# Patient Record
Sex: Female | Born: 1975
Health system: Southern US, Community
[De-identification: ages and names within clinical notes are randomized; demographics above are authoritative.]

## PROBLEM LIST (undated history)

## (undated) DIAGNOSIS — R102 Pelvic and perineal pain unspecified side: Secondary | ICD-10-CM

## (undated) DIAGNOSIS — M8580 Other specified disorders of bone density and structure, unspecified site: Secondary | ICD-10-CM

## (undated) DIAGNOSIS — G4730 Sleep apnea, unspecified: Secondary | ICD-10-CM

## (undated) DIAGNOSIS — F411 Generalized anxiety disorder: Secondary | ICD-10-CM

## (undated) DIAGNOSIS — F329 Major depressive disorder, single episode, unspecified: Secondary | ICD-10-CM

## (undated) DIAGNOSIS — G43909 Migraine, unspecified, not intractable, without status migrainosus: Secondary | ICD-10-CM

## (undated) DIAGNOSIS — M1990 Unspecified osteoarthritis, unspecified site: Secondary | ICD-10-CM

## (undated) DIAGNOSIS — R0600 Dyspnea, unspecified: Secondary | ICD-10-CM

## (undated) DIAGNOSIS — Z8742 Personal history of other diseases of the female genital tract: Secondary | ICD-10-CM

## (undated) DIAGNOSIS — Z8659 Personal history of other mental and behavioral disorders: Secondary | ICD-10-CM

## (undated) DIAGNOSIS — Z923 Personal history of irradiation: Secondary | ICD-10-CM

## (undated) DIAGNOSIS — Z8719 Personal history of other diseases of the digestive system: Secondary | ICD-10-CM

## (undated) DIAGNOSIS — C50412 Malignant neoplasm of upper-outer quadrant of left female breast: Secondary | ICD-10-CM

## (undated) DIAGNOSIS — C50919 Malignant neoplasm of unspecified site of unspecified female breast: Secondary | ICD-10-CM

## (undated) DIAGNOSIS — Z9221 Personal history of antineoplastic chemotherapy: Secondary | ICD-10-CM

## (undated) DIAGNOSIS — Z973 Presence of spectacles and contact lenses: Secondary | ICD-10-CM

## (undated) DIAGNOSIS — R600 Localized edema: Secondary | ICD-10-CM

## (undated) DIAGNOSIS — E079 Disorder of thyroid, unspecified: Secondary | ICD-10-CM

## (undated) DIAGNOSIS — K649 Unspecified hemorrhoids: Secondary | ICD-10-CM

## (undated) DIAGNOSIS — M5020 Other cervical disc displacement, unspecified cervical region: Secondary | ICD-10-CM

## (undated) DIAGNOSIS — M810 Age-related osteoporosis without current pathological fracture: Secondary | ICD-10-CM

## (undated) DIAGNOSIS — G4733 Obstructive sleep apnea (adult) (pediatric): Secondary | ICD-10-CM

## (undated) DIAGNOSIS — K219 Gastro-esophageal reflux disease without esophagitis: Secondary | ICD-10-CM

## (undated) DIAGNOSIS — Z9889 Other specified postprocedural states: Secondary | ICD-10-CM

## (undated) DIAGNOSIS — T7840XA Allergy, unspecified, initial encounter: Secondary | ICD-10-CM

## (undated) DIAGNOSIS — R112 Nausea with vomiting, unspecified: Secondary | ICD-10-CM

## (undated) DIAGNOSIS — M797 Fibromyalgia: Secondary | ICD-10-CM

## (undated) DIAGNOSIS — M1380 Other specified arthritis, unspecified site: Secondary | ICD-10-CM

## (undated) DIAGNOSIS — M069 Rheumatoid arthritis, unspecified: Secondary | ICD-10-CM

## (undated) DIAGNOSIS — J302 Other seasonal allergic rhinitis: Secondary | ICD-10-CM

## (undated) HISTORY — DX: Other specified disorders of bone density and structure, unspecified site: M85.80

## (undated) HISTORY — DX: Fibromyalgia: M79.7

## (undated) HISTORY — DX: Age-related osteoporosis without current pathological fracture: M81.0

## (undated) HISTORY — PX: ABDOMINAL HYSTERECTOMY: SHX81

## (undated) HISTORY — DX: Major depressive disorder, single episode, unspecified: F32.9

## (undated) HISTORY — DX: Other cervical disc displacement, unspecified cervical region: M50.20

## (undated) HISTORY — PX: COLONOSCOPY: SHX174

## (undated) HISTORY — DX: Rheumatoid arthritis, unspecified: M06.9

## (undated) HISTORY — DX: Obstructive sleep apnea (adult) (pediatric): G47.33

## (undated) HISTORY — DX: Generalized anxiety disorder: F41.1

## (undated) HISTORY — PX: COLONOSCOPY WITH ESOPHAGOGASTRODUODENOSCOPY (EGD): SHX5779

## (undated) HISTORY — DX: Sleep apnea, unspecified: G47.30

## (undated) HISTORY — PX: LAPAROSCOPIC ASSISTED VAGINAL HYSTERECTOMY: SHX5398

## (undated) HISTORY — DX: Other seasonal allergic rhinitis: J30.2

## (undated) HISTORY — DX: Migraine, unspecified, not intractable, without status migrainosus: G43.909

## (undated) HISTORY — DX: Disorder of thyroid, unspecified: E07.9

## (undated) HISTORY — PX: UPPER GASTROINTESTINAL ENDOSCOPY: SHX188

---

## 1997-10-27 ENCOUNTER — Ambulatory Visit (HOSPITAL_COMMUNITY): Admission: RE | Admit: 1997-10-27 | Discharge: 1997-10-27 | Payer: Self-pay | Admitting: Obstetrics & Gynecology

## 2000-02-05 ENCOUNTER — Emergency Department (HOSPITAL_COMMUNITY): Admission: EM | Admit: 2000-02-05 | Discharge: 2000-02-06 | Payer: Self-pay | Admitting: Emergency Medicine

## 2000-06-02 ENCOUNTER — Other Ambulatory Visit: Admission: RE | Admit: 2000-06-02 | Discharge: 2000-06-02 | Payer: Self-pay | Admitting: Obstetrics and Gynecology

## 2000-07-07 HISTORY — PX: LAPAROSCOPY: SHX197

## 2001-08-17 ENCOUNTER — Other Ambulatory Visit: Admission: RE | Admit: 2001-08-17 | Discharge: 2001-08-17 | Payer: Self-pay | Admitting: Obstetrics and Gynecology

## 2002-05-27 ENCOUNTER — Encounter: Payer: Self-pay | Admitting: Emergency Medicine

## 2002-05-27 ENCOUNTER — Emergency Department (HOSPITAL_COMMUNITY): Admission: EM | Admit: 2002-05-27 | Discharge: 2002-05-27 | Payer: Self-pay | Admitting: Emergency Medicine

## 2002-07-03 ENCOUNTER — Inpatient Hospital Stay (HOSPITAL_COMMUNITY): Admission: AD | Admit: 2002-07-03 | Discharge: 2002-07-03 | Payer: Self-pay | Admitting: *Deleted

## 2002-07-23 ENCOUNTER — Emergency Department (HOSPITAL_COMMUNITY): Admission: EM | Admit: 2002-07-23 | Discharge: 2002-07-23 | Payer: Self-pay | Admitting: Emergency Medicine

## 2002-12-06 ENCOUNTER — Inpatient Hospital Stay (HOSPITAL_COMMUNITY): Admission: EM | Admit: 2002-12-06 | Discharge: 2002-12-10 | Payer: Self-pay | Admitting: Psychiatry

## 2003-02-14 ENCOUNTER — Inpatient Hospital Stay (HOSPITAL_COMMUNITY): Admission: AD | Admit: 2003-02-14 | Discharge: 2003-02-14 | Payer: Self-pay | Admitting: Obstetrics and Gynecology

## 2003-09-06 ENCOUNTER — Emergency Department (HOSPITAL_COMMUNITY): Admission: EM | Admit: 2003-09-06 | Discharge: 2003-09-06 | Payer: Self-pay | Admitting: Emergency Medicine

## 2004-06-29 ENCOUNTER — Inpatient Hospital Stay (HOSPITAL_COMMUNITY): Admission: AD | Admit: 2004-06-29 | Discharge: 2004-06-29 | Payer: Self-pay | Admitting: Obstetrics and Gynecology

## 2004-10-02 ENCOUNTER — Inpatient Hospital Stay (HOSPITAL_COMMUNITY): Admission: AD | Admit: 2004-10-02 | Discharge: 2004-10-02 | Payer: Self-pay | Admitting: Obstetrics and Gynecology

## 2005-03-15 ENCOUNTER — Emergency Department (HOSPITAL_COMMUNITY): Admission: EM | Admit: 2005-03-15 | Discharge: 2005-03-15 | Payer: Self-pay | Admitting: Family Medicine

## 2005-04-01 ENCOUNTER — Inpatient Hospital Stay (HOSPITAL_COMMUNITY): Admission: AD | Admit: 2005-04-01 | Discharge: 2005-04-01 | Payer: Self-pay | Admitting: *Deleted

## 2005-04-15 ENCOUNTER — Ambulatory Visit: Payer: Self-pay | Admitting: Obstetrics and Gynecology

## 2005-05-09 ENCOUNTER — Inpatient Hospital Stay (HOSPITAL_COMMUNITY): Admission: AD | Admit: 2005-05-09 | Discharge: 2005-05-09 | Payer: Self-pay | Admitting: *Deleted

## 2005-05-14 ENCOUNTER — Inpatient Hospital Stay (HOSPITAL_COMMUNITY): Admission: AD | Admit: 2005-05-14 | Discharge: 2005-05-14 | Payer: Self-pay | Admitting: Obstetrics and Gynecology

## 2005-05-20 ENCOUNTER — Ambulatory Visit: Payer: Self-pay | Admitting: Obstetrics and Gynecology

## 2005-07-09 ENCOUNTER — Inpatient Hospital Stay (HOSPITAL_COMMUNITY): Admission: AD | Admit: 2005-07-09 | Discharge: 2005-07-09 | Payer: Self-pay | Admitting: Obstetrics and Gynecology

## 2005-07-29 ENCOUNTER — Ambulatory Visit: Payer: Self-pay | Admitting: Obstetrics & Gynecology

## 2005-08-06 ENCOUNTER — Ambulatory Visit: Payer: Self-pay | Admitting: Obstetrics and Gynecology

## 2005-09-25 IMAGING — CT CT ABDOMEN W/ CM
1 of 3 series · 13 of 32 positions shown, 19 images · IV contrast ([ID] READICAT & [ID] OMNI/300)
Comparison: none

CLINICAL DATA: Severe right abdominal pain.  
CT ABDOMEN AND PELVIS WITH ORAL AND IV CONTRAST ? 06/29/04:
TECHNIQUE: 100 cc of Omnipaque 300 contrast intravenous with 7 mm helical imaging performed through the abdomen and pelvis.
No comparisons.   
ABDOMEN CT WITH CONTRAST:

[Series 2: — · axial · 0.57mm/px · z∈[-405,-45]mm · 13 of 70 slices shown, 19 images]
[im 5/70  soft-tissue]
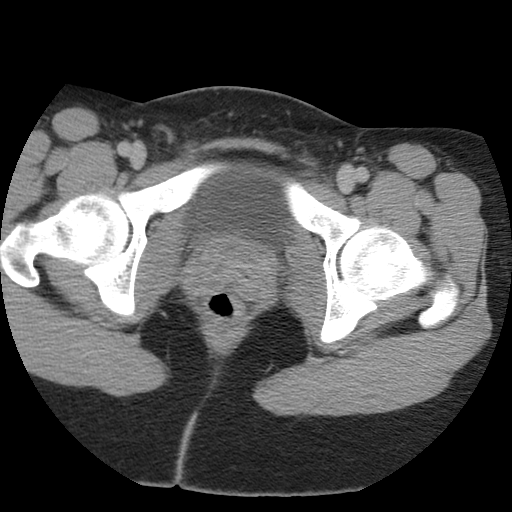
[im 5/70  bone]
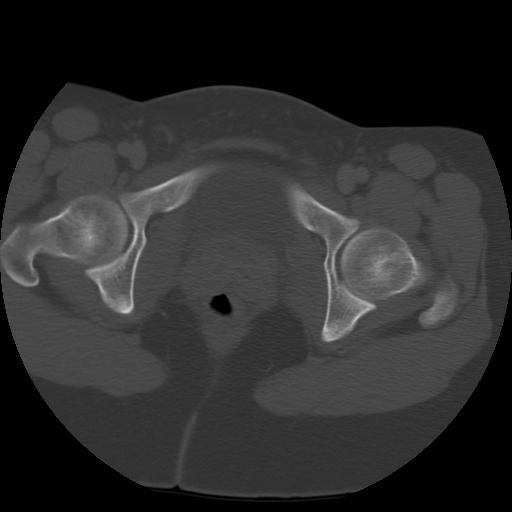
[im 10/70  soft-tissue]
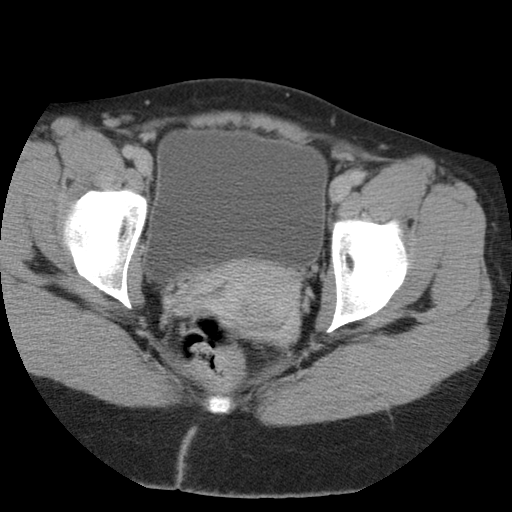
[im 14/70  soft-tissue]
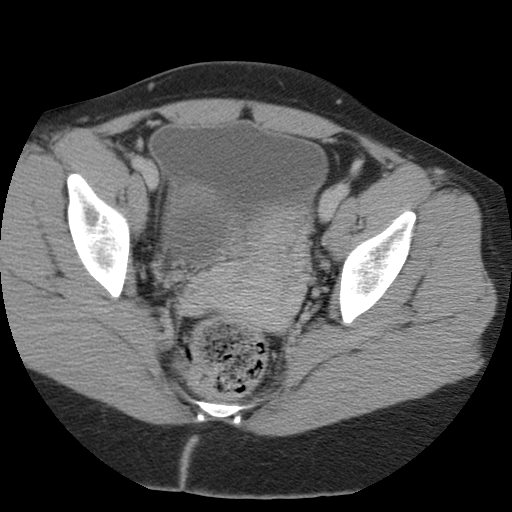
[im 19/70  soft-tissue]
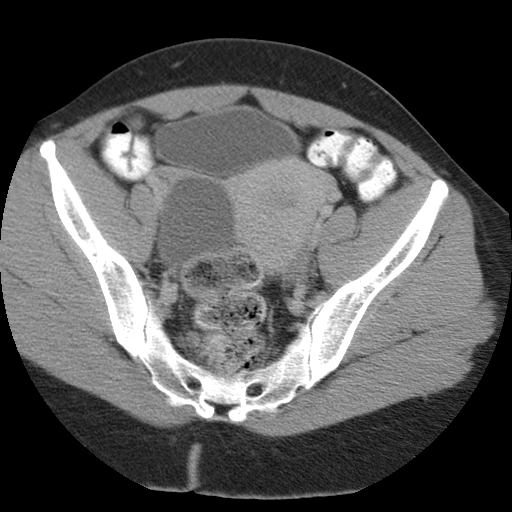
[im 24/70  soft-tissue]
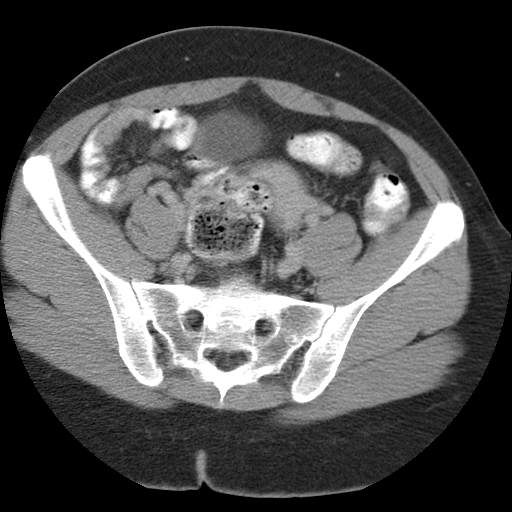
[im 28/70  soft-tissue]
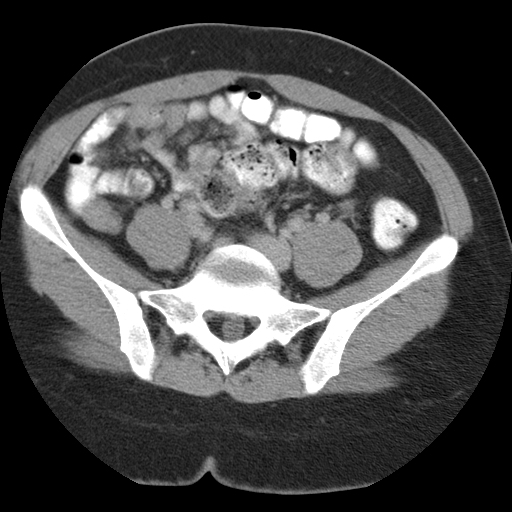
[im 37/70  soft-tissue]
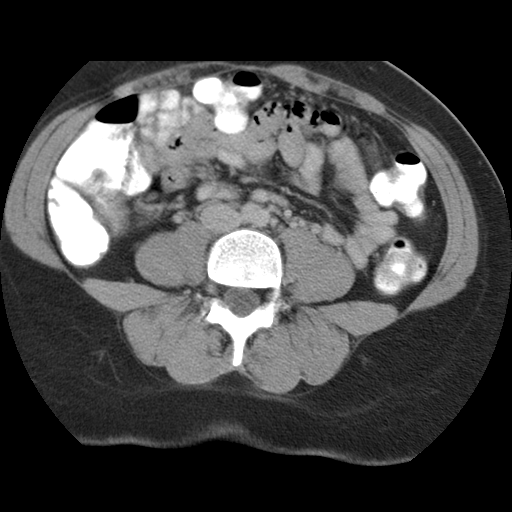
[im 42/70  soft-tissue]
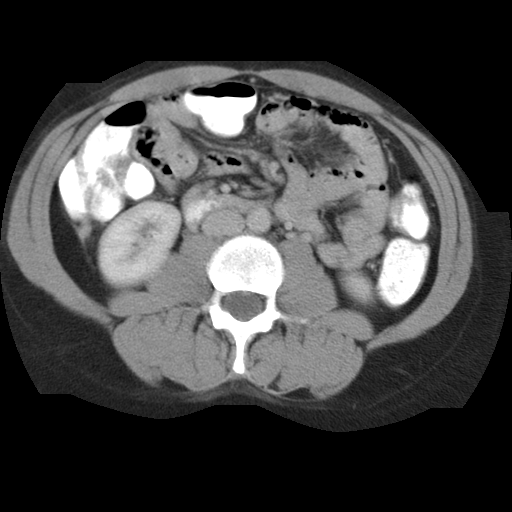
[im 47/70  soft-tissue]
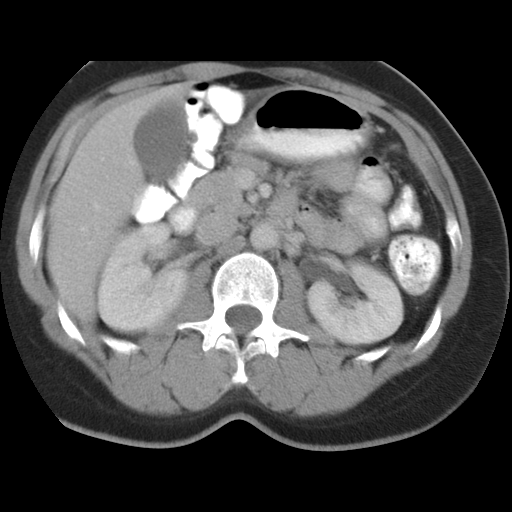
[im 47/70  bone]
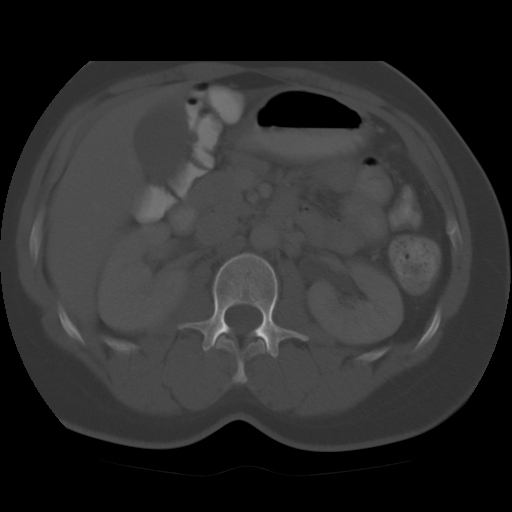
[im 51/70  soft-tissue]
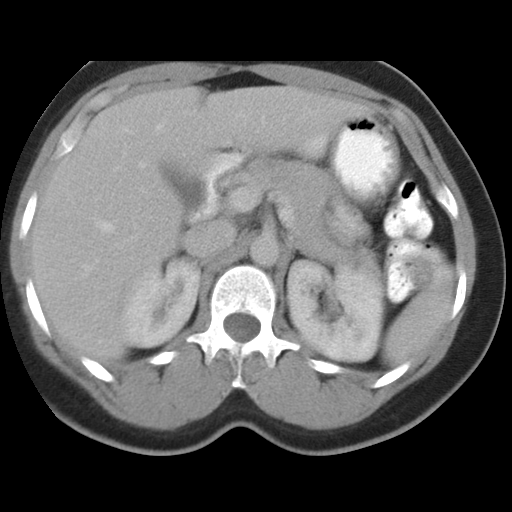
[im 51/70  lung]
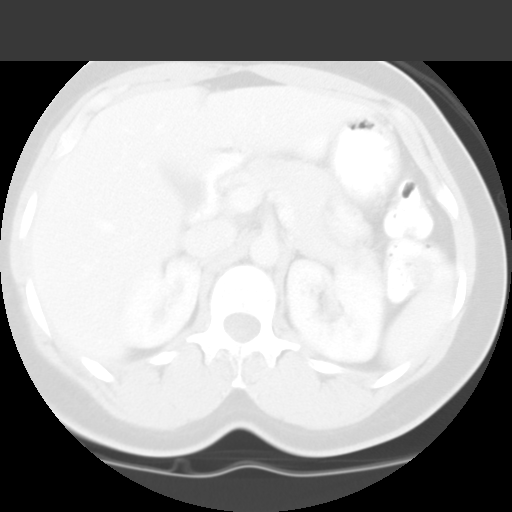
[im 56/70  soft-tissue]
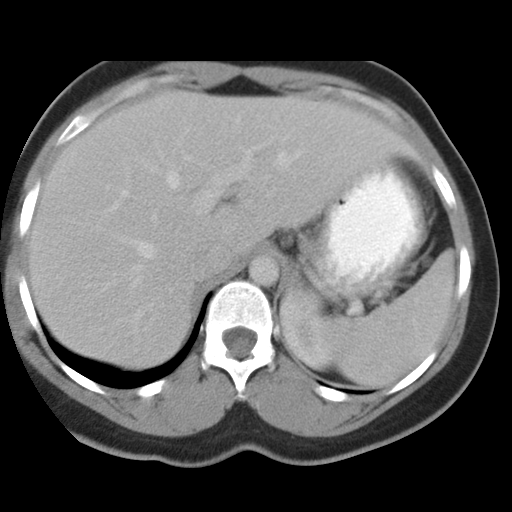
[im 56/70  lung]
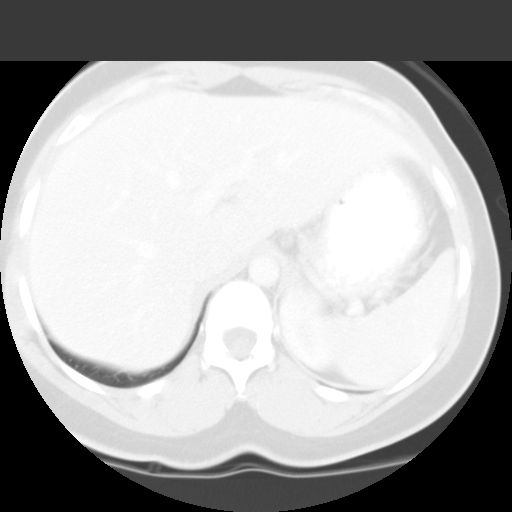
[im 60/70  soft-tissue]
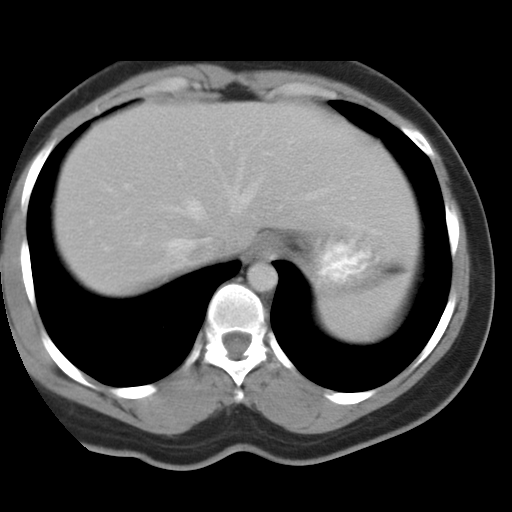
[im 60/70  lung]
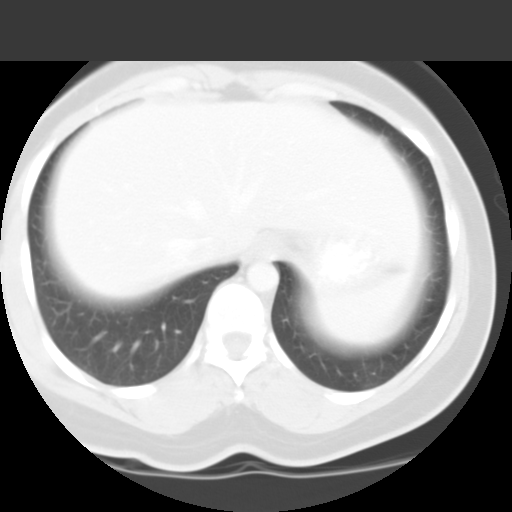
[im 65/70  soft-tissue]
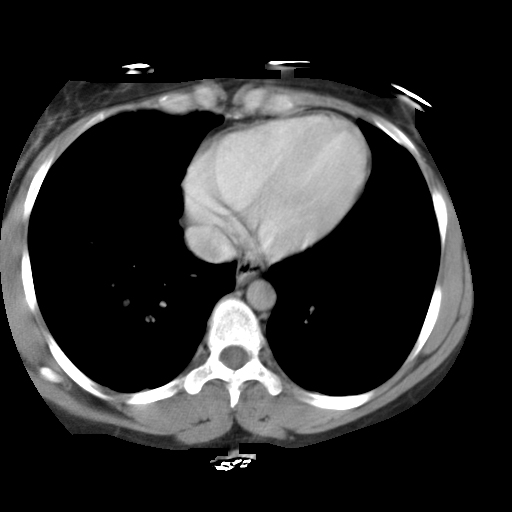
[im 65/70  lung]
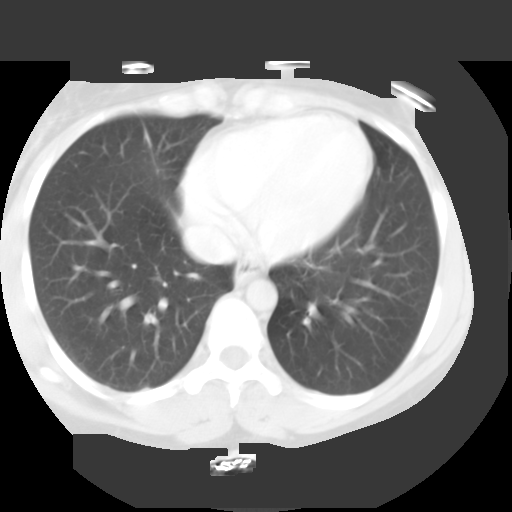

[13 of 32 positions shown; findings below may reference images not displayed]

FINDINGS: The lung bases are clear.  Minor inferior right middle lobe scarring or atelectasis.  No pericardial or pleural fluid.  The liver, gallbladder, biliary system, pancreas, spleen, adrenal glands, and kidneys are normal.  No bowel obstruction, ascites, lymphadenopathy, or free air.  In the right abdomen, the appendix appears to be retrocecal extending inferior to the liver surface.  There is contrast in the proximal aspect of the appendix without evidence of definite wall thickening.  The terminal ileum is normal as well.
IMPRESSION: No acute finding in the abdomen.
CT PELVIS WITH CONTRAST:
FINDINGS: In the right adnexa, there is an adnexal cystic lesion measuring 5.5 x 4.7 cm consistent with a right ovarian cyst.  No significant free fluid.  The cyst does appear to displace the uterus to the left.  The uterus and bladder otherwise are normal.
IMPRESSION: 5.5 cm right adnexal likely ovarian cyst.

## 2005-10-06 ENCOUNTER — Ambulatory Visit: Payer: Self-pay | Admitting: Obstetrics & Gynecology

## 2005-10-08 ENCOUNTER — Ambulatory Visit: Payer: Self-pay | Admitting: Obstetrics & Gynecology

## 2005-10-08 ENCOUNTER — Encounter: Payer: Self-pay | Admitting: Obstetrics & Gynecology

## 2005-12-03 ENCOUNTER — Ambulatory Visit: Payer: Self-pay | Admitting: Obstetrics & Gynecology

## 2006-01-14 ENCOUNTER — Inpatient Hospital Stay (HOSPITAL_COMMUNITY): Admission: RE | Admit: 2006-01-14 | Discharge: 2006-01-16 | Payer: Self-pay | Admitting: Obstetrics & Gynecology

## 2006-01-14 ENCOUNTER — Ambulatory Visit: Payer: Self-pay | Admitting: Obstetrics & Gynecology

## 2006-01-14 ENCOUNTER — Encounter (INDEPENDENT_AMBULATORY_CARE_PROVIDER_SITE_OTHER): Payer: Self-pay | Admitting: Specialist

## 2006-02-05 ENCOUNTER — Ambulatory Visit: Payer: Self-pay | Admitting: Obstetrics and Gynecology

## 2006-02-25 ENCOUNTER — Ambulatory Visit: Payer: Self-pay | Admitting: Obstetrics & Gynecology

## 2006-08-07 ENCOUNTER — Inpatient Hospital Stay (HOSPITAL_COMMUNITY): Admission: AD | Admit: 2006-08-07 | Discharge: 2006-08-08 | Payer: Self-pay | Admitting: Obstetrics & Gynecology

## 2006-11-10 ENCOUNTER — Emergency Department (HOSPITAL_COMMUNITY): Admission: EM | Admit: 2006-11-10 | Discharge: 2006-11-10 | Payer: Self-pay | Admitting: Family Medicine

## 2006-11-27 ENCOUNTER — Emergency Department (HOSPITAL_COMMUNITY): Admission: EM | Admit: 2006-11-27 | Discharge: 2006-11-27 | Payer: Self-pay | Admitting: Emergency Medicine

## 2007-11-26 ENCOUNTER — Emergency Department (HOSPITAL_COMMUNITY): Admission: EM | Admit: 2007-11-26 | Discharge: 2007-11-26 | Payer: Self-pay | Admitting: Emergency Medicine

## 2007-12-20 ENCOUNTER — Emergency Department (HOSPITAL_COMMUNITY): Admission: EM | Admit: 2007-12-20 | Discharge: 2007-12-20 | Payer: Self-pay | Admitting: Emergency Medicine

## 2008-01-26 ENCOUNTER — Inpatient Hospital Stay (HOSPITAL_COMMUNITY): Admission: AD | Admit: 2008-01-26 | Discharge: 2008-01-26 | Payer: Self-pay | Admitting: Obstetrics & Gynecology

## 2008-02-09 ENCOUNTER — Ambulatory Visit: Payer: Self-pay | Admitting: Gynecology

## 2008-12-11 ENCOUNTER — Inpatient Hospital Stay (HOSPITAL_COMMUNITY): Admission: AD | Admit: 2008-12-11 | Discharge: 2008-12-11 | Payer: Self-pay | Admitting: Obstetrics & Gynecology
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
## 2009-04-16 ENCOUNTER — Emergency Department (HOSPITAL_COMMUNITY): Admission: EM | Admit: 2009-04-16 | Discharge: 2009-04-16 | Payer: Self-pay | Admitting: Emergency Medicine

## 2010-06-03 ENCOUNTER — Encounter: Admission: RE | Admit: 2010-06-03 | Discharge: 2010-06-03 | Payer: Self-pay | Admitting: Obstetrics & Gynecology

## 2010-10-14 LAB — URINALYSIS, ROUTINE W REFLEX MICROSCOPIC
Bilirubin Urine: NEGATIVE
Glucose, UA: NEGATIVE mg/dL
Hgb urine dipstick: NEGATIVE
Ketones, ur: NEGATIVE mg/dL
Nitrite: NEGATIVE
Protein, ur: NEGATIVE mg/dL
Specific Gravity, Urine: 1.02 (ref 1.005–1.030)
Urobilinogen, UA: 0.2 mg/dL (ref 0.0–1.0)
pH: 6 (ref 5.0–8.0)

## 2010-11-19 NOTE — Group Therapy Note (Signed)
[name], [name]             ACCOUNT NO.:  [id number]   MEDICAL RECORD NO.:  [id number]          PATIENT TYPE:  WOC   LOCATION:  [facility]                   FACILITY:  [facility]   PHYSICIAN:  Caren Griffins, CNM       DATE OF BIRTH:  01/07/1976   DATE OF SERVICE:                                  CLINIC NOTE   REASON FOR VISIT:  Lower abdominal pain.   HISTORY:  [name] is a 35 year old G1, P1 who underwent a  transvaginal hysterectomy in 2007 after having dysmenorrhea that was  refractory to medical treatment.  Findings were a small posterior  fibroid  and no clear evidence of endometriosis, which had been the  working diagnosis, however, the patient states that after her  hysterectomy her pain was 95% gone.  She is here in followup from an MAU  visit where she was evaluated on January 26, 2008 for lower abdominal pain.  She is describing mid lower abdominal pain that is often worse with  urination and has been present on and off for several weeks.  It is also  related to dyspareunia.  She states that when she has too much wine or  when her bladder is over full, she has a flare.  She describes it as  sharp mid suprapubic area pain that lasts about 20 minutes at a time.  She also has some urgency and frequency, voiding small amounts, gets up  2 times a night to void.  She occasionally has mittelschmerz.  She has  no stress or urgency urinary incontinence.  She did not get filled  prescriptions for Bactrim, Pyridium or Flagyl which were given to her  after her January 26, 2008 visit.  Of note, she had bladder tenderness on  exam which was thought to be spasm of her bladder but she did have a  completely negative urinalysis and no urine culture.  She now states she  does have some finances to fill prescriptions.   OBJECTIVE:  VITAL SIGNS:  98 temperature, pulse 72, BP 117/81, weight  174, height 5 feet 4 inches.  GENERAL:  She is in no distress.  Well nourished, well developed.  Physical exam is deferred as she just had exam with pelvic on January 26, 2008.   ASSESSMENT:  Possible early interstitial cystitis.   PLAN:  The patient is given a [facility] handout and website information  explaining risk factors, causes, symptoms and management for  interstitial cystitis and painful bladder syndrome.  She is advised to  avoid any trigger symptoms including too much caffeinated drinks,  alcohol, coffee and tea, or spicy foods.  In consultation with Dr. Okey Dupre,  we are advising her to go ahead and take her Flagyl for the BV as well  as take the Pyridium and not to take the Bactrim.  If she still has  problems after a week or so, she is also advised to do a voiding diary  prior to a return visit.  We will consider referral to a urologist if  her symptoms are still present.           ______________________________  Caren Griffins, CNM     DP/MEDQ  D:  02/09/2008  T:  02/09/2008  Job:  [id number]

## 2010-11-22 NOTE — Group Therapy Note (Signed)
[name], [name]             ACCOUNT NO.:  [id number]   MEDICAL RECORD NO.:  [id number]          PATIENT TYPE:  WOC   LOCATION:  [facility]                   FACILITY:  [facility]   PHYSICIAN:  Argentina Donovan, MD        DATE OF BIRTH:  February 02, 1976   DATE OF SERVICE:  04/15/2005                                    CLINIC NOTE   The patient is a 35 year old black female, gravida 1, para 1-0-0-1, with a  child age 1, had a laparoscopy back in '96 for severe dysmenorrhea and then  was treated after that for endometriosis.  Has been on continual oral  contraceptives 3 months at a time, just beginning up until this last 3 month  cycle, she was taking cyclically with a period every month and not  tolerating periods very well.  She has __________ Lupron, and we told her  that it was something she was going to use temporarily, and we mentioned  Depo-Provera to her.  It sounded like a good idea since she has had  continual spotting on the Ortho-Novum, probably 30 mg.  We are going to  start her on Depo-Provera.  She is to continue for 1 more week on the oral  contraceptives and see how she does on that.  Told that she must take  calcium and vitamin D when she is on Depo-Provera, and she needs an  injection every 3 months.   IMPRESSION:  Severe dysmenorrhea, probable endometriosis.  The other thing  is that the patient was seen in the MAU because of spotting, was diagnosed  with severe cervicitis was treated with __________ and Zithromax and has not  had any spotting since.           ______________________________  Argentina Donovan, MD     PR/MEDQ  D:  04/15/2005  T:  04/15/2005  Job:  [id number]

## 2010-11-22 NOTE — Discharge Summary (Signed)
[name], [name]             ACCOUNT NO.:  [id number]   MEDICAL RECORD NO.:  [id number]          PATIENT TYPE:  INP   LOCATION:  9316                          [facility]   PHYSICIAN:  Lesly Dukes, M.D. DATE OF BIRTH:  07-13-1975   DATE OF ADMISSION:  01/14/2006  DATE OF DISCHARGE:  12/03/2005                                 DISCHARGE SUMMARY   HOSPITAL COURSE:  The patient is a 35 year old female who underwent  laparoscopically assisted vaginal hysterectomy on January 14, 2006.  The  procedure was uncomplicated and the details of this procedure are in a  dictated operative note.  Postoperatively, the patient had increased pain on  postoperative day 1; however, throughout the day she began to ambulate more,  pass flatus and was able to have her pain controlled with oral pain  medication.  She never had any fevers and her vital signs remained stable at  all times.  The patient was discharged on January 16, 2006 in good condition.   DISCHARGE INSTRUCTIONS:  1.  No vaginal intercourse for six weeks.  2.  No heavy lifting for six weeks.  3.  No driving for one week or while taking pain medication.  4.  Take temperature and if greater than or equal to 100.4, come to      emergency room.  5.  Come to emergency room if severe abdominal pain, severe nausea or      vomiting.   DISCHARGE MEDICATIONS:  1.  Vicodin.  The patient was given prescription on July 10, the day before      coming to the hospital, for 40 pills.  The patient will use this for      postoperative pain.  2.  Motrin.   FOLLOWUP:  Follow-up appointment is in five weeks with Dr. Penne Lash in the  [facility].           ______________________________  Lesly Dukes, M.D.     KHL/MEDQ  D:  01/16/2006  T:  01/16/2006  Job:  [id number]

## 2010-11-22 NOTE — Op Note (Signed)
[name], [name]             ACCOUNT NO.:  [id number]   MEDICAL RECORD NO.:  [id number]          PATIENT TYPE:  INP   LOCATION:  9399                          [facility]   PHYSICIAN:  Lesly Dukes, M.D. DATE OF BIRTH:  [birth year]   DATE OF PROCEDURE:  01/14/2006  DATE OF DISCHARGE:                                 OPERATIVE REPORT   PREOPERATIVE DIAGNOSES:  The patient is a 35 year old female with  dysmenorrhea, refractory to medical therapy.   POSTOPERATIVE DIAGNOSES:  The patient is a 35 year old female with  dysmenorrhea, refractory to medical therapy.   PROCEDURE:  Transvaginal hysterectomy.   SURGEON:  Lesly Dukes, M.D.   ASSISTANT:  Ginger Carne, M.D.   ANESTHESIA:  General.   PATHOLOGY:  Uterus.   EBL:  600.   COMPLICATIONS:  None.   FINDINGS:  Globular uterus with a small posterior fibroid.  No clear  evidence of endometriosis.   DESCRIPTION OF PROCEDURE:  Informed consent was obtained.  The patient was  taken to the operating room, where general anesthesia was induced.  The  patient was placed in dorsal lithotomy position.  An exam under anesthesia  was performed.  Anteverted uterus, about 10-week size, was felt.  Uterine  manipulator was inserted after uterine sounding.  This was done after the  patient was prepared and draped in normal sterile fashion.  A Foley was  placed into the bladder, as well.  Gown and gloves were changed.  Using the  open method, the 12 mm umbilical incision was made.  The peritoneal cavity  was entered bluntly and a Hasson trocar was entered into the abdomen and a  pneumoperitoneum was achieved with CO2.  Two 5 mm ports were placed in the  lower quadrants bilaterally, under direct visualization.  Survey of  abdominal contents was performed with the above findings.  Using the Gyrus,  the round ligaments, utero-ovarians and a portion of the broad ligament were  transected and coagulated.  The bladder flap was  also created.   At this point, the vaginal portion of the procedure was performed.  Retractors were placed into the vagina and the cervix was grasped with a  Gerilyn Pilgrim tenaculum.  Pitressin was used to infiltrate the cervix.  The cervix  was circumferentially incised and the peritoneum was then identified and  entered sharply.  Retractors were placed intraperitoneally.  The same  procedure was done posteriorly and a weighted speculum was placed  posteriorly.  The cardinal ligaments were then clamped, transected and  suture ligated with 0 Vicryl.  The uterine arteries were then clamped,  transected and suture ligated with 0 Vicryl.  Good hemostasis was noted.  The broad ligaments were then clamped, transected and suture ligated with 0  Vicryl.  This released the specimen.  It was sent off to pathology.  The  pedicles were noted to be hemostatic.  The vaginal cuff and peritoneum were  closed posteriorly in a running baseball method and then the vaginal cuff  was closed anterior to posterior with 0 Vicryl in a running method.   Good hemostasis was  noted.  Clindamycin packing was placed into the vagina.  Gown and glove were changed again and a look laparoscopically noted a small  amount of oozing and appropriate coagulation was performed.  The abdomen was  copiously irrigated and hemostasis was assured one last time.  All  instruments were removed from the patient's abdomen and pneumoperitoneum was  released.  The 12 mm umbilical port was closed with 0 Vicryl and the  subcuticular ports were closed.   The patient tolerated the procedure well.  Sponge, instrument and needle  counts were correct times two.  The patient went to the recovery room in  stable condition.           ______________________________  Lesly Dukes, M.D.     KHL/MEDQ  D:  01/14/2006  T:  01/14/2006  Job:  [id number]

## 2010-11-22 NOTE — Discharge Summary (Signed)
NAMEKRITHI, [name]                         ACCOUNT NO.:  [id number]   MEDICAL RECORD NO.:  [id number]                   PATIENT TYPE:  IPS   LOCATION:  0501                                 [facility]ACILITY[facility]   PHYSICIAN:  Jeanice Lim, M.D.              DATE OF BIRTH:  01/02/1976   DATE OF ADMISSION:  12/06/2002  DATE OF DISCHARGE:  12/10/2002                                 DISCHARGE SUMMARY   IDENTIFYING DATA:  This is a 35 year old African-American female, divorced,  voluntarily admitted with a history of depression  and marital conflict.  Felt overwhelmed and wanted to crash car.  Suicidal for 2-3 days.   MEDICATIONS:  None.   ALLERGIES:  No known drug allergies.   PHYSICAL EXAMINATION:  Negative.  Neurologically nonfocal.   LABORATORY DATA:  Routine admission labs within normal limits.   MENTAL STATUS EXAM:  Fully alert, in no acute distress.  Cooperative.  Pleasant.  Speech within normal limits.  Mood depressed.  Thought processes  goal directed.  No acute suicidal or homicidal ideation.  Cognitively  intact.  Judgment and insight fair at this time.   ADMISSION DIAGNOSES:   AXIS I:  Major depressive disorder, recurrent, severe.   AXIS II:  Deferred.   AXIS III:  None.   AXIS IV:  Moderate (problems with support system and relationship).   AXIS V:  35/65-70.   HOSPITAL COURSE:  The patient was admitted and ordered routine p.r.n.  medications and underwent further monitoring.  Was encouraged to  participated in individual, group and milieu therapy.  Was placed on 15-  minute checks.  The patient reported wanting to drive car off road and kill  self.  Was very suicidal.  Wanted help.  Was going through a divorce and had  separated from ex-husband prior to admission.  The patient reported a  decrease in suicidal ideation, tolerated medication changes, reported a  positive response to clinical intervention.   CONDITION ON DISCHARGE:  Markedly improved.  Mood  more euthymic.  Affect  brighter.  Thought processes goal directed.  Thought content negative for  dangerous ideation or psychotic symptoms.  The patient reported motivation  to be compliant with the aftercare plan.   DISCHARGE MEDICATIONS:  1. Zoloft 50 mg q.a.m.  2. Ambien 10 mg, 1/2-1 q.h.s. p.r.n.   FOLLOW UP:  With Dr. ___________, primary care physician, on December 15, 2002  at 1 p.m. and for therapy with Dr. Theodoro Kos on December 30, 2002 at 1  p.m.   DISCHARGE DIAGNOSES:   AXIS I:  Major depressive disorder, recurrent, severe.   AXIS II:  Deferred.   AXIS III:  None.   AXIS IV:  Moderate (problems with support system and relationship).   AXIS V:  Global Assessment of Functioning on discharge 55.  Jeanice Lim, M.D.    JEM/MEDQ  D:  01/18/2003  T:  01/19/2003  Job:  [id number]
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
## 2010-11-22 NOTE — Group Therapy Note (Signed)
[name], [name]             ACCOUNT NO.:  [id number]   MEDICAL RECORD NO.:  [id number]          PATIENT TYPE:  WOC   LOCATION:  [facility]                   FACILITY:  [facility]   PHYSICIAN:  Elsie Lincoln, MD      DATE OF BIRTH:  22-Jul-1975   DATE OF SERVICE:                                    CLINIC NOTE   Patient is a 35 year old female who presents, deciding she wants a  hysterectomy.  Patient has __________ endometriosis.  She has had two  diagnostic laparoscopies by [facility], once in 1995 and once  in 1999.  There was no evidence of endometriosis but she has been treated  like she has had endometriosis with successful pain relief.  She has had  Lupron II shots and does get pain relief from this; however, the first month  after receiving shots, she has had bleeding for a month and cramping, and  she does not want to continue Lupron and having this one month of cramping  and bleeding, she just wants to go ahead and have definitive permanent  hysterectomy and BSO.  Patient understands this could put her into  menopause, and this puts her at increased risk for bone loss and heart  disease.  We will most likely put her on an estrogen patch.  I did offer her  a tertiary care center referral to [facility] with their chronic pain  specialist, and actually, I encouraged this strongly, but she does not want  this, she wants a hysterectomy.   So, she has been contemplating this for over six months, and I believe she  has put a lot of thought into this.  She previously thought she wanted no  more children, but this is now longer possible, as per her, she says the  pain is debilitating during that month in between shots, and she just wants  to go ahead and have everything done.  I will proceed with LAVH in case  there are adhesions.   PAST SURGICAL HISTORY:  Laparoscopy x2.   GYNECOLOGIC HISTORY:  NSVD x1.  No history of gonorrhea or Chlamydia.  No  abnormal Pap  smears.  No history of ovarian cyst or fibroid tumors.   FAMILY HISTORY:  No ovarian, uterine, breast, or colon cancer.  No history  of early heart attacks.   MEDICATIONS:  Lupron.   ALLERGIES:  Denies.   PHYSICAL EXAMINATION:  GENERAL:  Well-developed and well-nourished in no  apparent distress.  VITAL SIGNS:  Temperature 99.3, pulse 100, blood pressure 119/79.  Weight  178.1.  Height 5 foot 4 inches.  HEENT:  Normocephalic and atraumatic.  NECK:  Thyroid:  No masses.  LUNGS:  Clear to auscultation bilaterally.  HEART:  Regular rate and rhythm.  ABDOMEN:  Soft, nontender, nondistended.  Belly flat and well healed from  previous laparoscopy.  No hernia.  GENITALIA:  Tanner 5.  Uterus retroflexed.  Slightly tender.  Adnexa:  No  masses.  Nontender.   ASSESSMENT/PLAN:  A 35 year old female who has decided to have a  hysterectomy.  Again, as described above, I counseled her significantly  about her risk of going into menopause to her health.  She also understands  that a hysterectomy may not relieve all of her pain.  She also understands  that there is a risk of bleeding, infection, damage to intra-abdominal with  the laparoscopic assisted vaginal hysterectomy.  Even knowing all of this,  the patient elects for this procedure.  I believe that she is aware of all  of these things and has made a cautious decision.  We will schedule in the  next couple of weeks.           ______________________________  Elsie Lincoln, MD     KL/MEDQ  D:  12/03/2005  T:  12/03/2005  Job:  [id number]

## 2010-11-22 NOTE — Group Therapy Note (Signed)
NAMEVARIE, MACHAMER             ACCOUNT NO.:  [id number]   MEDICAL RECORD NO.:  [id number]          PATIENT TYPE:  WOC   LOCATION:  [facility]                   FACILITY:  [facility]   PHYSICIAN:  Elsie Lincoln, MD      DATE OF BIRTH:  12-03-1975   DATE OF SERVICE:                                    CLINIC NOTE   Patient is a 35 year old female, para 1, who has severe endometriosis and  pain related with this, who presented today to talk about hysterectomy.  She  had been on Depo-Provera in the past, but the spotting is getting on her  nerves and I believe she still has some pain.  She lost a job recently  because of the periods and she wants definitive treatment.  Today we  discussed Depo-Lupron and the possibility of keeping her on this long-term.  She does want another child and is slightly depressed that this not  possible, so I think that we should try Depo-Lupron today to see if putting  her in a medical menopause helps her periods.  If it does, then we could  consider long-term treatment with this with AVBAC therapy.  The patient  seems amenable to this.  I did warn her that she may have a withdrawal bleed  two weeks after the Depo-Lupron.  However, it will most likely be minimal to  none given that she is already under the influence of Depo-Provera.  Urine  pregnancy test not necessary secondary to Depo-Provera given on May 20, 2005.  The patient is to come back in 2-1/2 months to discuss with myself or  Dr. Okey Dupre how she did on Depo-Lupron and if she should have another shot, or  to do a hysterectomy or go visit Dr. __________  in [facility].           ______________________________  Elsie Lincoln, MD     KL/MEDQ  D:  07/29/2005  T:  07/29/2005  Job:  [id number]

## 2010-11-22 NOTE — Group Therapy Note (Signed)
Jordan, [name]             ACCOUNT NO.:  [id number]   MEDICAL RECORD NO.:  [id number]          PATIENT TYPE:  WOC   LOCATION:  [facility]                   FACILITY:  [facility]   PHYSICIAN:  Argentina Donovan, MD        DATE OF BIRTH:  Mar 20, 1976   DATE OF SERVICE:  05/20/2005                                    CLINIC NOTE   This patient is a 35 year old black female gravida 1 para 1-0-0-1 with a  child age 2 who had a laparoscopy back in 1996 for severe dysmenorrhea. Was  treated then for endometriosis and has been on continual contraceptive up  until recently when we tried her on Depo-Provera because of breakthrough  bleeding she was having. She has continued to have that on Depo-Provera. We  are going to give her one more shot today and refer her to [name] at the  [place] [facility]. The patient and her mother are  with me. She continues to have severe dysmenorrhea and has been in the MAU  on multiple occasions because of her spotting, bleeding, and pain. She is  ready for any kind of treatment and if the pain clinic cannot treat her on a  conservative basis, she is ready for hysterectomy. I think probably we will  try and preserve her ovaries unless they were significantly involved in a  disease process. If the patient continues to have the pain and desires, we  will schedule her for abdominal hysterectomy. She has no sign of painful  bladder syndrome or irritable bowel syndrome and I think that it probably  would be helped by hysterectomy as last resort. If the patient calls, I will  schedule her. I am going to give her today a prescription for Ultram 50. I  am going to give her Dr. Charolotte Eke number and also an additional injection of  Depo-Provera.           ______________________________  Argentina Donovan, MD     PR/MEDQ  D:  05/20/2005  T:  05/21/2005  Job:  [id number]

## 2010-11-22 NOTE — Group Therapy Note (Signed)
NAMEARNITA, KOONS             ACCOUNT NO.:  [id number]   MEDICAL RECORD NO.:  [id number]          PATIENT TYPE:  WOC   LOCATION:  [facility]                   FACILITY:  [facility]   PHYSICIAN:  Elsie Lincoln, MD      DATE OF BIRTH:  May 16, 1976   DATE OF SERVICE:                                    CLINIC NOTE   Patient is a 35 year old female, status post LABH for dysmenorrhea.  The  patient did well postoperatively.  She has had some mild urinary complaints.  Urinalysis does not look suspicious for urinary tract infection; however,  there is small leukocyte esterase and a few bacteria.  We will treat her  with ciprofloxacin empirically.  She is worried that she has interstitial  cystitis, but the following exam is negative for that.  The patient did have  CIN 1 on her hysterectomy specimen; although, her pap smear was normal.  We  will follow her with Pap smears for several years.  She has already had  sexual intercourse and has not had any problems.  She has already all set to  go back to work, which she is doing well.  She is a Lawyer.   PHYSICAL EXAMINATION:  VITAL SIGNS:  Temperature 99.1, pulse 81, blood  pressure 121/83.  Weight 80.3 kilograms, height 5 feet, 4 inches.  GENERAL:  Well-nourished, well-developed.  No apparent distress.  ABDOMEN:  Soft and nondistended.  All 3 incisions well-healed.  There is a  small piece of Vicryl in the right lower quadrant incision that was removed.  GENITALIA:  __________ Tanner 5.  Vagina pink, normal rugae.  Cuff intact,  no granulation tissue.  There is a small amount of suture at the apex that  will continue to dissolve.  Good vaginal link.  The bladder is nontender to  palpation.  The urethra is nontender.   ASSESSMENT:  A 35 year old female status post LABH.   PLAN:  1. Return to normal activity.  2. Ciprofloxacin for 3 days.  3. If urinary complaints of trouble getting stream started and pain with      urination  continue, will consider urology referral.  4. Return to clinic in a year for yearly visit for Pap smear or sooner if      urinary complaints are not resolved.           ______________________________  Elsie Lincoln, MD     KL/MEDQ  D:  02/25/2006  T:  02/26/2006  Job:  [id number]

## 2011-04-04 LAB — URINALYSIS, ROUTINE W REFLEX MICROSCOPIC
Bilirubin Urine: NEGATIVE
Glucose, UA: NEGATIVE
Hgb urine dipstick: NEGATIVE
Ketones, ur: NEGATIVE
Nitrite: NEGATIVE
Protein, ur: NEGATIVE
Specific Gravity, Urine: 1.015
Urobilinogen, UA: 0.2
pH: 6

## 2011-04-04 LAB — WET PREP, GENITAL
Trich, Wet Prep: NONE SEEN
Yeast Wet Prep HPF POC: NONE SEEN

## 2011-06-09 ENCOUNTER — Other Ambulatory Visit: Payer: Self-pay | Admitting: Obstetrics & Gynecology

## 2011-06-09 DIAGNOSIS — N803 Endometriosis of pelvic peritoneum, unspecified: Secondary | ICD-10-CM

## 2011-06-12 ENCOUNTER — Ambulatory Visit (HOSPITAL_COMMUNITY)
Admission: RE | Admit: 2011-06-12 | Discharge: 2011-06-12 | Disposition: A | Discharge status: Home / Self Care | Payer: PRIVATE HEALTH INSURANCE | Source: Ambulatory Visit | Attending: Obstetrics & Gynecology | Admitting: Obstetrics & Gynecology

## 2011-06-12 DIAGNOSIS — N949 Unspecified condition associated with female genital organs and menstrual cycle: Secondary | ICD-10-CM | POA: Insufficient documentation

## 2011-06-12 DIAGNOSIS — Z90710 Acquired absence of both cervix and uterus: Secondary | ICD-10-CM | POA: Insufficient documentation

## 2011-06-12 DIAGNOSIS — N803 Endometriosis of pelvic peritoneum, unspecified: Secondary | ICD-10-CM

## 2011-06-12 DIAGNOSIS — N809 Endometriosis, unspecified: Secondary | ICD-10-CM | POA: Insufficient documentation

## 2011-06-13 ENCOUNTER — Ambulatory Visit (HOSPITAL_COMMUNITY): Payer: Self-pay

## 2011-07-07 ENCOUNTER — Inpatient Hospital Stay (HOSPITAL_COMMUNITY)
Admission: AD | Admit: 2011-07-07 | Discharge: 2011-07-07 | Disposition: A | Discharge status: Home / Self Care | Payer: PRIVATE HEALTH INSURANCE | Source: Ambulatory Visit | Attending: Obstetrics & Gynecology | Admitting: Obstetrics & Gynecology

## 2011-07-07 ENCOUNTER — Encounter (HOSPITAL_COMMUNITY): Payer: Self-pay

## 2011-07-07 DIAGNOSIS — Z3049 Encounter for surveillance of other contraceptives: Secondary | ICD-10-CM | POA: Insufficient documentation

## 2011-07-07 MED ORDER — MEDROXYPROGESTERONE ACETATE 150 MG/ML IM SUSP
150.0000 mg | Freq: Once | INTRAMUSCULAR | Status: AC
  Administered 2011-07-07: 150 mg via INTRAMUSCULAR

## 2011-07-07 NOTE — Progress Notes (Signed)
No adverse effect from depo injection

## 2011-07-07 NOTE — Progress Notes (Signed)
Pt here for depo injection only, MD had pt fill rx at her pharmacy and brought medication to MAU for administration. RN sent medication to pharmacy for evaluation, and ability to scan pt/injection. Pt has no complaint of pain or bleeding, here for injection only.

## 2011-09-02 ENCOUNTER — Ambulatory Visit: Payer: PRIVATE HEALTH INSURANCE | Admitting: Internal Medicine

## 2011-09-02 VITALS — BP 131/85 | HR 82 | Temp 99.0°F | Resp 16 | Ht 64.0 in | Wt 188.8 lb

## 2011-09-02 DIAGNOSIS — G4700 Insomnia, unspecified: Secondary | ICD-10-CM

## 2011-09-02 DIAGNOSIS — F32A Depression, unspecified: Secondary | ICD-10-CM

## 2011-09-02 DIAGNOSIS — F419 Anxiety disorder, unspecified: Secondary | ICD-10-CM

## 2011-09-02 DIAGNOSIS — R197 Diarrhea, unspecified: Secondary | ICD-10-CM

## 2011-09-02 MED ORDER — SERTRALINE HCL 50 MG PO TABS: ORAL_TABLET | ORAL | Status: DC

## 2011-09-02 MED ORDER — ALPRAZOLAM 0.25 MG PO TABS
ORAL_TABLET | ORAL | Status: DC
Start: 2011-09-02 — End: 2015-06-14

## 2011-09-02 NOTE — Patient Instructions (Signed)
Depression You have signs of depression. This is a common problem. It can occur at any age. It is often hard to recognize. People can suffer from depression and still have moments of enjoyment. Depression interferes with your basic ability to function in life. It upsets your relationships, sleep, eating, and work habits. CAUSES  Depression is believed to be caused by an imbalance in brain chemicals. It may be triggered by an unpleasant event. Relationship crises, a death in the family, financial worries, retirement, or other stressors are normal causes of depression. Depression may also start for no known reason. Other factors that may play a part include medical illnesses, some medicines, genetics, and alcohol or drug abuse. SYMPTOMS   Feeling unhappy or worthless.   Long-lasting (chronic) tiredness or worn-out feeling.   Self-destructive thoughts and actions.   Not being able to sleep or sleeping too much.   Eating more than usual or not eating at all.   Headaches or feeling anxious.   Trouble concentrating or making decisions.   Unexplained physical problems and substance abuse.  TREATMENT  Depression usually gets better with treatment. This can include:  Antidepressant medicines. It can take weeks before the proper dose is achieved and benefits are reached.   Talking with a therapist, clergyperson, counselor, or friend. These people can help you gain insight into your problem and regain control of your life.   Eating a good diet.   Getting regular physical exercise, such as walking for 30 minutes every day.   Not abusing alcohol or drugs.  Treating depression often takes 6 months or longer. This length of treatment is needed to keep symptoms from returning. Call your caregiver and arrange for follow-up care as suggested. SEEK IMMEDIATE MEDICAL CARE IF:   You start to have thoughts of hurting yourself or others.   Call your local emergency services (911 in [place]   Go to  your local medical emergency department.   Call the National Suicide Prevention Lifeline: 1-800-273-TALK (1-800-273-8255).  Document Released: 06/23/2005 Document Revised: 03/05/2011 Document Reviewed: 11/23/2009 [employer] Patient Information 2012 [employer]. 

## 2011-09-02 NOTE — Progress Notes (Signed)
  Subjective:    Patient ID: [name], female    DOB: 1976-03-18, 36 y.o.   MRN: [id number]  HPI Councelor   Stress Review of Systems     Objective:   Physical Exam Normal       Assessment & Plan:   Insomnia Anxiety Diarrhea

## 2011-10-07 ENCOUNTER — Inpatient Hospital Stay (HOSPITAL_COMMUNITY)
Admission: AD | Admit: 2011-10-07 | Discharge: 2011-10-07 | Disposition: A | Discharge status: Home / Self Care | Payer: PRIVATE HEALTH INSURANCE | Source: Ambulatory Visit | Attending: Obstetrics & Gynecology | Admitting: Obstetrics & Gynecology

## 2011-10-07 ENCOUNTER — Encounter (HOSPITAL_COMMUNITY): Payer: Self-pay | Admitting: *Deleted

## 2011-10-07 DIAGNOSIS — N803 Endometriosis of pelvic peritoneum, unspecified: Secondary | ICD-10-CM | POA: Insufficient documentation

## 2011-10-07 MED ORDER — MEDROXYPROGESTERONE ACETATE 150 MG/ML IM SUSP
150.0000 mg | Freq: Once | INTRAMUSCULAR | Status: AC
  Administered 2011-10-07: 150 mg via INTRAMUSCULAR

## 2011-10-07 NOTE — MAU Note (Signed)
PT SAYS HAD HYST    2007-  PT HAS ENDOMETROSIS PAIN. RECEIVES MEDS FOR IT.

## 2011-10-07 NOTE — MAU Note (Signed)
PT HERE FOR INJECTION ONLY-  SENT MEDROXYPROGESTERONE  150MG   TO PHARMACY TO BE EVALUATED.  PT BROUGHT MED FROM HOME   . LAST INJ WAS 07-07-2011.   NO ALLERGIC REACTION  NOTED/ OR REPORTED.

## 2012-09-07 IMAGING — US US PELVIS COMPLETE
1 series · 14 of 25 positions shown · non-contrast
Comparison: None.

CLINICAL DATA: Pelvic pain and endometriosis.  Post hysterectomy in
6994.

TRANSABDOMINAL AND TRANSVAGINAL ULTRASOUND OF PELVIS
TECHNIQUE: Both transabdominal and transvaginal ultrasound
examinations of the pelvis were performed. Transabdominal technique
was performed for global imaging of the pelvis including vaginal
cuff, ovaries, adnexal regions, and pelvic cul-de-sac.

[Series 1: us pelvis complete · 36 acquisitions, 14 frames shown]
[im 1/36]
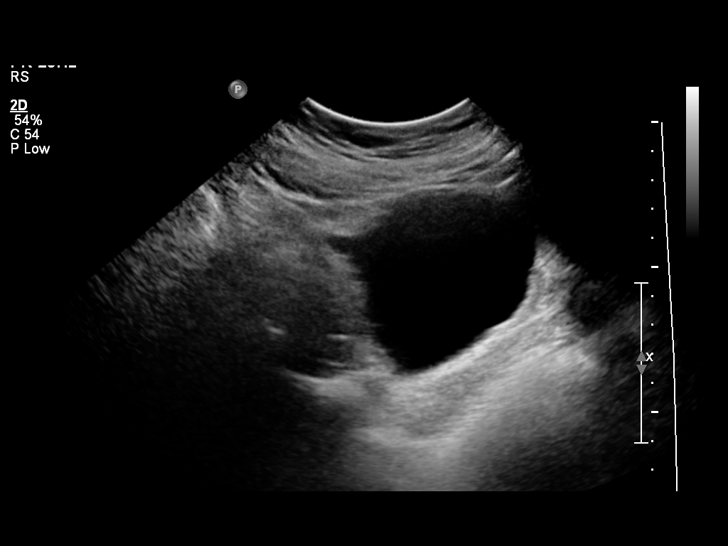
[im 3/36]
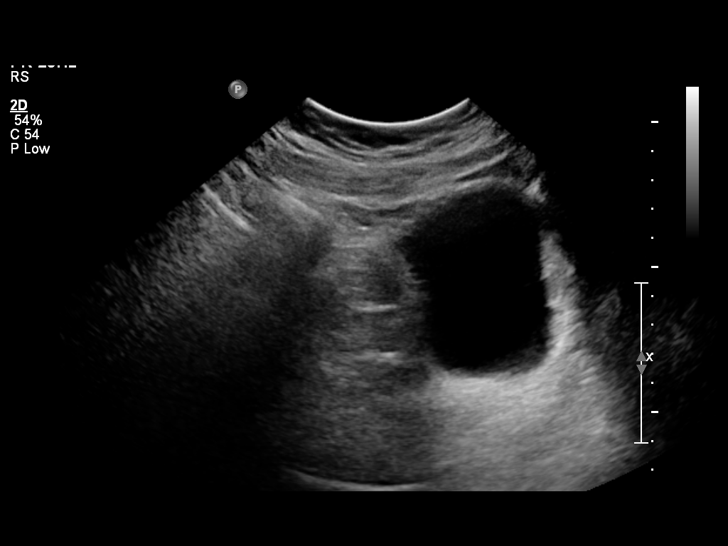
[im 6/36]
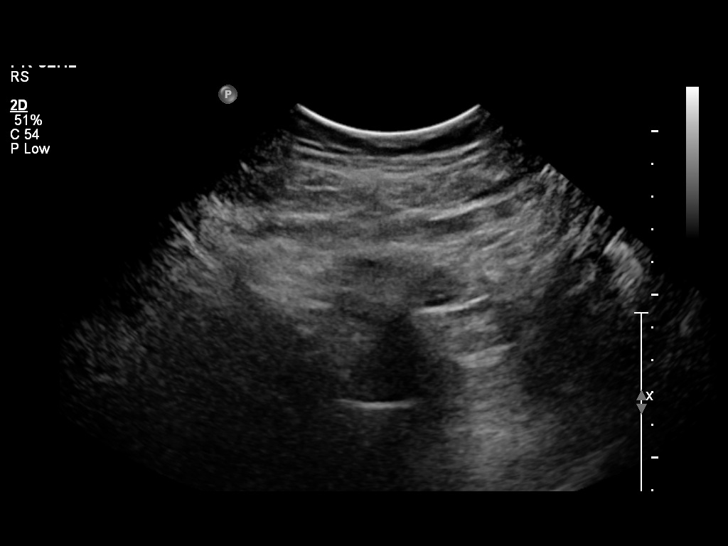
[im 9/36]
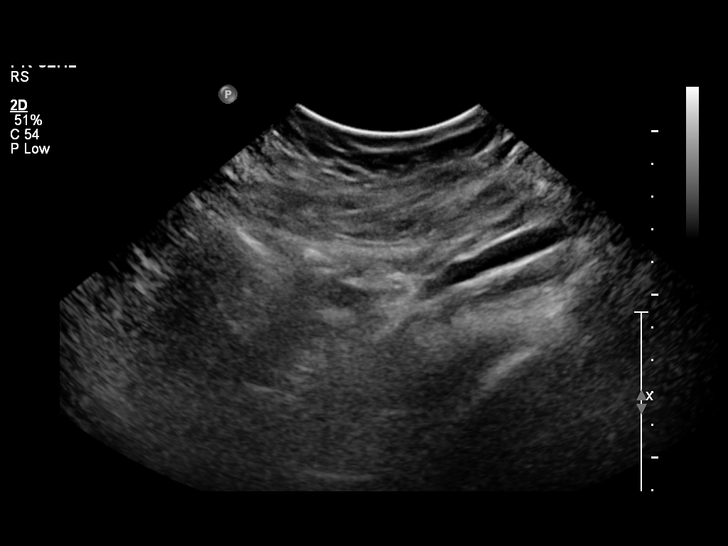
[im 12/36]
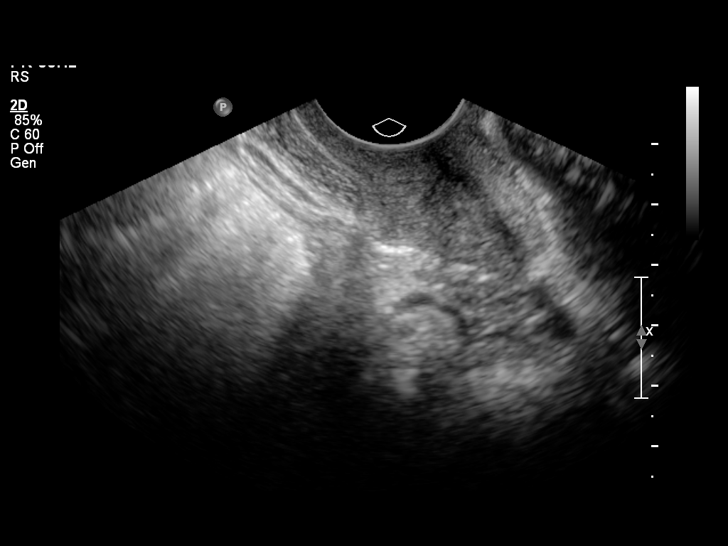
[im 14/36]
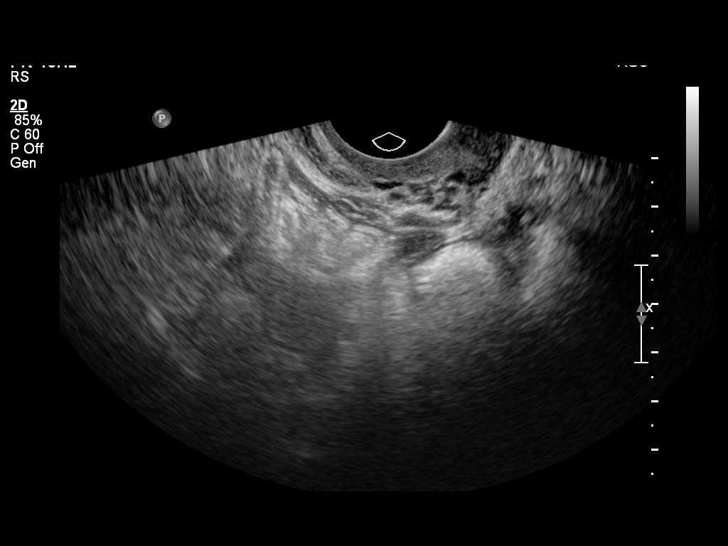
[im 17/36]
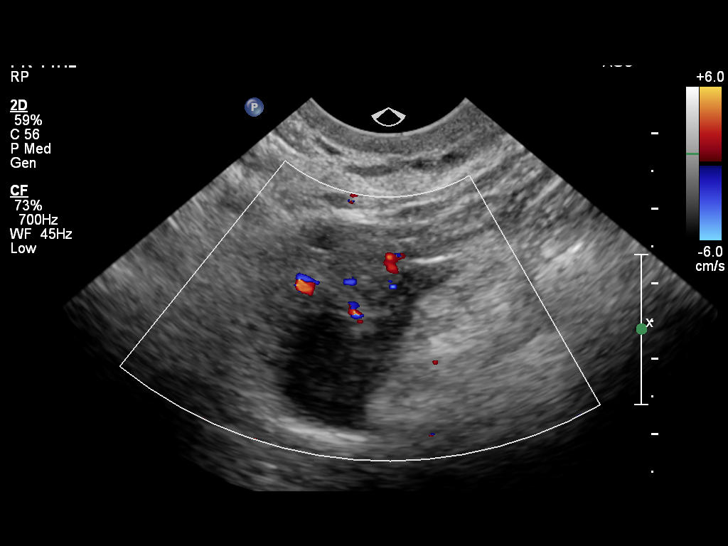
[im 19/36]
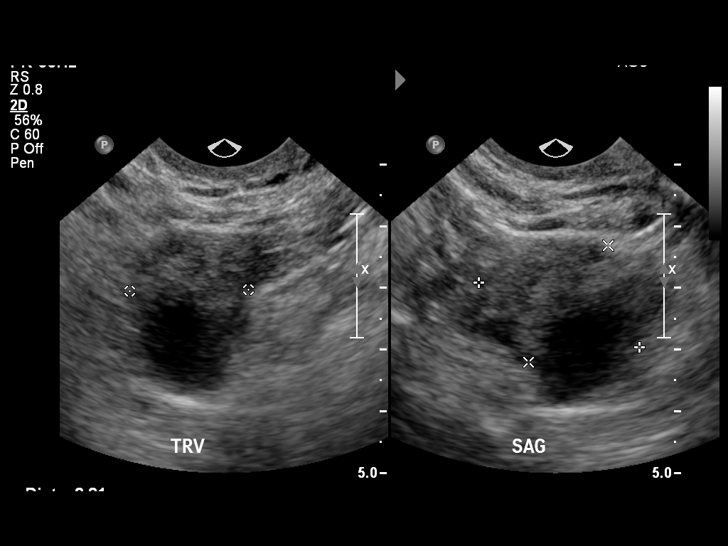
[im 22/36]
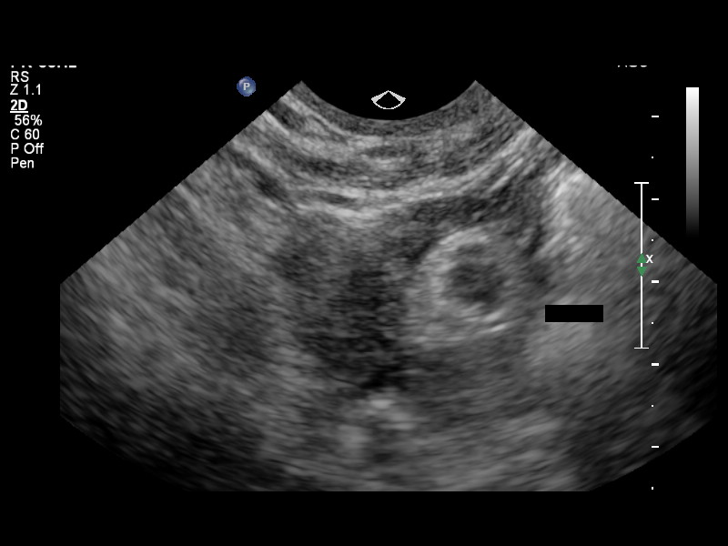
[im 24/36]
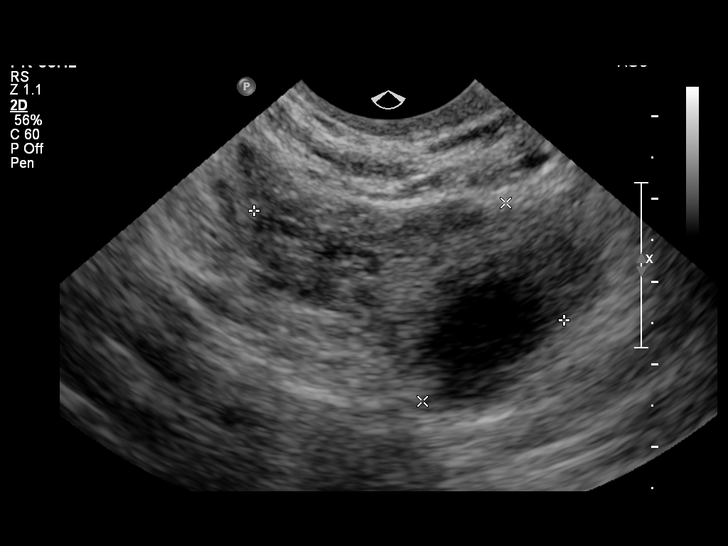
[im 27/36]
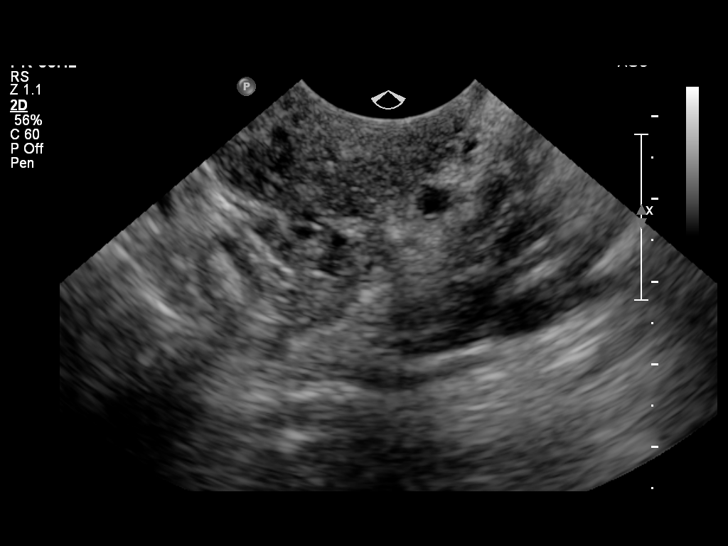
[im 30/36]
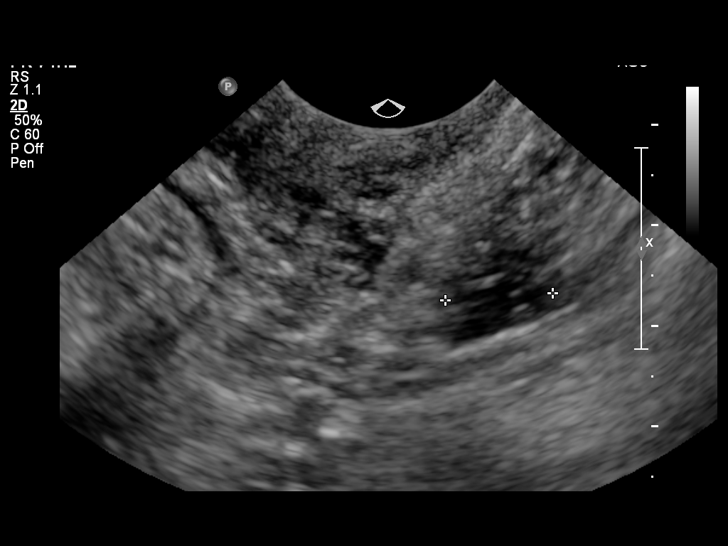
[im 33/36]
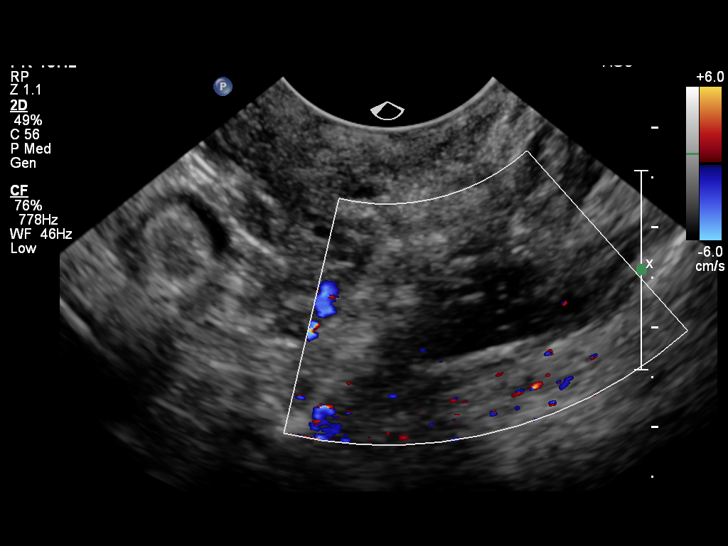
[im 36/36]
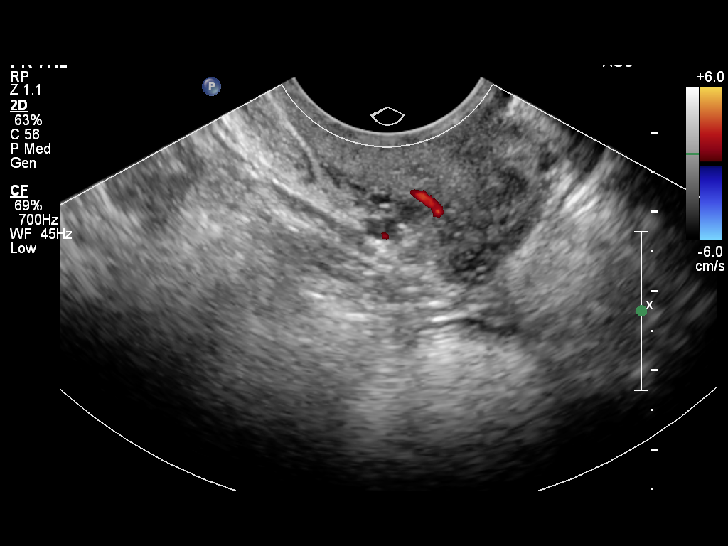

[14 of 25 positions shown; findings below may reference images not displayed]

It was necessary to proceed with endovaginal exam following the
transabdominal exam to visualize the vaginal cuff and ovaries.
FINDINGS: Uterus: Has been surgically removed.  A normal vaginal cuff is
identified.

Endometrium: Not applicable

Right ovary:  Measures 4.0 x 2.6 x 2.2 cm and contains a dominant
follicle

Left ovary: Measures measures 2.4 x 0.8 x 1.1 cm and has a normal
appearance

Other findings: No pelvic fluid or separate adnexal masses are
seen.
IMPRESSION: Unremarkable post hysterectomy pelvic ultrasound.

## 2014-04-29 ENCOUNTER — Emergency Department (HOSPITAL_BASED_OUTPATIENT_CLINIC_OR_DEPARTMENT_OTHER): Payer: No Typology Code available for payment source

## 2014-04-29 ENCOUNTER — Emergency Department (HOSPITAL_BASED_OUTPATIENT_CLINIC_OR_DEPARTMENT_OTHER)
Admission: EM | Admit: 2014-04-29 | Discharge: 2014-04-29 | Disposition: A | Discharge status: Home / Self Care | Payer: No Typology Code available for payment source | Attending: Emergency Medicine | Admitting: Emergency Medicine

## 2014-04-29 ENCOUNTER — Encounter (HOSPITAL_BASED_OUTPATIENT_CLINIC_OR_DEPARTMENT_OTHER): Payer: Self-pay | Admitting: Emergency Medicine

## 2014-04-29 DIAGNOSIS — F419 Anxiety disorder, unspecified: Secondary | ICD-10-CM | POA: Insufficient documentation

## 2014-04-29 DIAGNOSIS — S161XXA Strain of muscle, fascia and tendon at neck level, initial encounter: Secondary | ICD-10-CM | POA: Diagnosis not present

## 2014-04-29 DIAGNOSIS — Y9389 Activity, other specified: Secondary | ICD-10-CM | POA: Diagnosis not present

## 2014-04-29 DIAGNOSIS — R51 Headache: Secondary | ICD-10-CM | POA: Diagnosis not present

## 2014-04-29 DIAGNOSIS — Z791 Long term (current) use of non-steroidal anti-inflammatories (NSAID): Secondary | ICD-10-CM | POA: Diagnosis not present

## 2014-04-29 DIAGNOSIS — Z8742 Personal history of other diseases of the female genital tract: Secondary | ICD-10-CM | POA: Insufficient documentation

## 2014-04-29 DIAGNOSIS — S39012A Strain of muscle, fascia and tendon of lower back, initial encounter: Secondary | ICD-10-CM

## 2014-04-29 DIAGNOSIS — F329 Major depressive disorder, single episode, unspecified: Secondary | ICD-10-CM | POA: Insufficient documentation

## 2014-04-29 DIAGNOSIS — Y92410 Unspecified street and highway as the place of occurrence of the external cause: Secondary | ICD-10-CM | POA: Diagnosis not present

## 2014-04-29 DIAGNOSIS — Z79899 Other long term (current) drug therapy: Secondary | ICD-10-CM | POA: Diagnosis not present

## 2014-04-29 DIAGNOSIS — S199XXA Unspecified injury of neck, initial encounter: Secondary | ICD-10-CM | POA: Diagnosis present

## 2014-04-29 MED ORDER — HYDROCODONE-ACETAMINOPHEN 5-325 MG PO TABS: 2.0000 | ORAL_TABLET | ORAL | Status: DC | PRN

## 2014-04-29 MED ORDER — IBUPROFEN 800 MG PO TABS
800.0000 mg | ORAL_TABLET | Freq: Once | ORAL | Status: AC
  Administered 2014-04-29: 800 mg via ORAL
  Filled 2014-04-29: qty 1
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
## 2014-04-29 NOTE — ED Notes (Signed)
Pt discharged to home with family. NAD.  

## 2014-05-08 ENCOUNTER — Encounter (HOSPITAL_BASED_OUTPATIENT_CLINIC_OR_DEPARTMENT_OTHER): Payer: Self-pay | Admitting: Emergency Medicine

## 2014-05-12 DIAGNOSIS — Z90710 Acquired absence of both cervix and uterus: Secondary | ICD-10-CM | POA: Insufficient documentation

## 2014-05-12 DIAGNOSIS — N809 Endometriosis, unspecified: Secondary | ICD-10-CM | POA: Insufficient documentation

## 2014-07-07 DIAGNOSIS — Z8711 Personal history of peptic ulcer disease: Secondary | ICD-10-CM

## 2014-07-07 HISTORY — DX: Personal history of peptic ulcer disease: Z87.11

## 2015-05-04 ENCOUNTER — Encounter (HOSPITAL_BASED_OUTPATIENT_CLINIC_OR_DEPARTMENT_OTHER): Payer: Self-pay | Admitting: *Deleted

## 2015-05-04 ENCOUNTER — Emergency Department (HOSPITAL_BASED_OUTPATIENT_CLINIC_OR_DEPARTMENT_OTHER)
Admission: EM | Admit: 2015-05-04 | Discharge: 2015-05-04 | Disposition: A | Discharge status: Home / Self Care | Payer: No Typology Code available for payment source | Attending: Emergency Medicine | Admitting: Emergency Medicine

## 2015-05-04 DIAGNOSIS — Y998 Other external cause status: Secondary | ICD-10-CM | POA: Diagnosis not present

## 2015-05-04 DIAGNOSIS — Y92410 Unspecified street and highway as the place of occurrence of the external cause: Secondary | ICD-10-CM | POA: Insufficient documentation

## 2015-05-04 DIAGNOSIS — S4992XA Unspecified injury of left shoulder and upper arm, initial encounter: Secondary | ICD-10-CM | POA: Insufficient documentation

## 2015-05-04 DIAGNOSIS — S8992XA Unspecified injury of left lower leg, initial encounter: Secondary | ICD-10-CM | POA: Diagnosis not present

## 2015-05-04 DIAGNOSIS — S299XXA Unspecified injury of thorax, initial encounter: Secondary | ICD-10-CM | POA: Insufficient documentation

## 2015-05-04 DIAGNOSIS — T148 Other injury of unspecified body region: Secondary | ICD-10-CM | POA: Insufficient documentation

## 2015-05-04 DIAGNOSIS — Y9389 Activity, other specified: Secondary | ICD-10-CM | POA: Diagnosis not present

## 2015-05-04 DIAGNOSIS — S8991XA Unspecified injury of right lower leg, initial encounter: Secondary | ICD-10-CM | POA: Diagnosis not present

## 2015-05-04 DIAGNOSIS — F329 Major depressive disorder, single episode, unspecified: Secondary | ICD-10-CM | POA: Diagnosis not present

## 2015-05-04 DIAGNOSIS — F419 Anxiety disorder, unspecified: Secondary | ICD-10-CM | POA: Insufficient documentation

## 2015-05-04 DIAGNOSIS — T148XXA Other injury of unspecified body region, initial encounter: Secondary | ICD-10-CM

## 2015-05-04 DIAGNOSIS — Z8742 Personal history of other diseases of the female genital tract: Secondary | ICD-10-CM | POA: Diagnosis not present

## 2015-05-04 DIAGNOSIS — S199XXA Unspecified injury of neck, initial encounter: Secondary | ICD-10-CM | POA: Diagnosis present

## 2015-05-04 MED ORDER — CYCLOBENZAPRINE HCL 10 MG PO TABS: 10.0000 mg | ORAL_TABLET | Freq: Two times a day (BID) | ORAL | Status: DC | PRN

## 2015-05-04 MED ORDER — ACETAMINOPHEN 500 MG PO TABS
1000.0000 mg | ORAL_TABLET | Freq: Once | ORAL | Status: AC
  Administered 2015-05-04: 1000 mg via ORAL
  Filled 2015-05-04: qty 2

## 2015-05-04 MED ORDER — NAPROXEN 500 MG PO TABS: 500.0000 mg | ORAL_TABLET | Freq: Two times a day (BID) | ORAL | Status: DC

## 2015-05-04 NOTE — Discharge Instructions (Signed)
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
## 2015-05-17 ENCOUNTER — Emergency Department (EMERGENCY_DEPARTMENT_HOSPITAL)
Admit: 2015-05-17 | Discharge: 2015-05-17 | Disposition: A | Discharge status: Home / Self Care | Payer: PRIVATE HEALTH INSURANCE | Attending: Emergency Medicine | Admitting: Emergency Medicine

## 2015-05-17 ENCOUNTER — Encounter (HOSPITAL_COMMUNITY): Payer: Self-pay | Admitting: Family Medicine

## 2015-05-17 ENCOUNTER — Emergency Department (HOSPITAL_COMMUNITY)
Admission: EM | Admit: 2015-05-17 | Discharge: 2015-05-17 | Disposition: A | Discharge status: Home / Self Care | Payer: PRIVATE HEALTH INSURANCE | Attending: Emergency Medicine | Admitting: Emergency Medicine

## 2015-05-17 DIAGNOSIS — F419 Anxiety disorder, unspecified: Secondary | ICD-10-CM | POA: Insufficient documentation

## 2015-05-17 DIAGNOSIS — M79662 Pain in left lower leg: Secondary | ICD-10-CM | POA: Diagnosis not present

## 2015-05-17 DIAGNOSIS — F329 Major depressive disorder, single episode, unspecified: Secondary | ICD-10-CM | POA: Diagnosis not present

## 2015-05-17 DIAGNOSIS — R1010 Upper abdominal pain, unspecified: Secondary | ICD-10-CM | POA: Diagnosis present

## 2015-05-17 DIAGNOSIS — M79622 Pain in left upper arm: Secondary | ICD-10-CM | POA: Diagnosis not present

## 2015-05-17 DIAGNOSIS — R1013 Epigastric pain: Secondary | ICD-10-CM | POA: Diagnosis not present

## 2015-05-17 DIAGNOSIS — Z79899 Other long term (current) drug therapy: Secondary | ICD-10-CM | POA: Diagnosis not present

## 2015-05-17 DIAGNOSIS — Z3202 Encounter for pregnancy test, result negative: Secondary | ICD-10-CM | POA: Diagnosis not present

## 2015-05-17 DIAGNOSIS — R42 Dizziness and giddiness: Secondary | ICD-10-CM | POA: Insufficient documentation

## 2015-05-17 DIAGNOSIS — Z8742 Personal history of other diseases of the female genital tract: Secondary | ICD-10-CM | POA: Diagnosis not present

## 2015-05-17 DIAGNOSIS — M79609 Pain in unspecified limb: Secondary | ICD-10-CM

## 2015-05-17 LAB — COMPREHENSIVE METABOLIC PANEL
ALT: 20 U/L (ref 14–54)
AST: 20 U/L (ref 15–41)
Albumin: 4.5 g/dL (ref 3.5–5.0)
Alkaline Phosphatase: 51 U/L (ref 38–126)
Anion gap: 7 (ref 5–15)
BUN: 12 mg/dL (ref 6–20)
CO2: 26 mmol/L (ref 22–32)
Calcium: 9.4 mg/dL (ref 8.9–10.3)
Chloride: 109 mmol/L (ref 101–111)
Creatinine, Ser: 0.77 mg/dL (ref 0.44–1.00)
GFR calc Af Amer: >60 mL/min (ref >60)
GFR calc non Af Amer: >60 mL/min (ref >60)
Glucose, Bld: 101 mg/dL — ABNORMAL HIGH (ref 65–99)
Potassium: 3.9 mmol/L (ref 3.5–5.1)
Sodium: 142 mmol/L (ref 135–145)
Total Bilirubin: 0.6 mg/dL (ref 0.3–1.2)
Total Protein: 7.6 g/dL (ref 6.5–8.1)

## 2015-05-17 LAB — I-STAT BETA HCG BLOOD, ED (MC, WL, AP ONLY): I-stat hCG, quantitative: <5 m[IU]/mL (ref <5)

## 2015-05-17 LAB — CBC WITH DIFFERENTIAL/PLATELET
Basophils Absolute: 0 10*3/uL (ref 0.0–0.1)
Basophils Relative: 0 %
Eosinophils Absolute: 0.3 10*3/uL (ref 0.0–0.7)
Eosinophils Relative: 3 %
HCT: 37.4 % (ref 36.0–46.0)
Hemoglobin: 12.8 g/dL (ref 12.0–15.0)
Lymphocytes Relative: 28 %
Lymphs Abs: 2.2 10*3/uL (ref 0.7–4.0)
MCH: 29.4 pg (ref 26.0–34.0)
MCHC: 34.2 g/dL (ref 30.0–36.0)
MCV: 86 fL (ref 78.0–100.0)
Monocytes Absolute: 0.5 10*3/uL (ref 0.1–1.0)
Monocytes Relative: 6 %
Neutro Abs: 4.9 10*3/uL (ref 1.7–7.7)
Neutrophils Relative %: 63 %
Platelets: 291 10*3/uL (ref 150–400)
RBC: 4.35 MIL/uL (ref 3.87–5.11)
RDW: 12.9 % (ref 11.5–15.5)
WBC: 7.9 10*3/uL (ref 4.0–10.5)

## 2015-05-17 LAB — LIPASE, BLOOD: Lipase: 27 U/L (ref 11–51)

## 2015-05-17 MED ORDER — ALPRAZOLAM 0.5 MG PO TABS
0.5000 mg | ORAL_TABLET | Freq: Once | ORAL | Status: AC
  Administered 2015-05-17: 0.5 mg via ORAL
  Filled 2015-05-17: qty 1

## 2015-05-17 MED ORDER — GI COCKTAIL ~~LOC~~
30.0000 mL | Freq: Once | ORAL | Status: AC
  Administered 2015-05-17: 30 mL via ORAL
  Filled 2015-05-17: qty 30

## 2015-05-17 MED ORDER — SUCRALFATE 1 G PO TABS: 1.0000 g | ORAL_TABLET | Freq: Three times a day (TID) | ORAL | Status: DC

## 2015-05-17 NOTE — ED Notes (Signed)
Patient is complaining of upper abd pain with diarrhea. Denies nausea, vomiting, or fever. Pt reports she was hospitalized in [facility] on May 04, 2015 for colon infection. Pt complains of left, lower, posterior leg pain. Denies numbness but states "a little" tingling. Pt complains of bilateral arm pain from previous admission of rotating IV sites. Also, this morning pt started experiencing dizziness.

## 2015-05-17 NOTE — ED Notes (Signed)
Informed pt that MD stated she would not need an IV at this time. Pt also requested someone from phlebotomy draw her blood using a butterfly needle. Informed 2 phlebotomists of situation, who stated they would be by shortly.

## 2015-05-17 NOTE — ED Provider Notes (Signed)
CSN: [id number]     Arrival date & time 05/17/15  [id number] History   First MD Initiated Contact with Patient 05/17/15 1030     Chief Complaint  Patient presents with  . Abdominal Pain  . Leg Pain  . Arm Pain  . Dizziness     Patient is a 39 y.o. female presenting with abdominal pain, leg pain, arm pain, and dizziness. The history is provided by the patient. No language interpreter was used.  Abdominal Pain Leg Pain Arm Pain Associated symptoms include abdominal pain.  Dizziness  Ms. Megan Salon is a 39 year old woman with history of endometriosis that presents for evaluation of abdominal pain and leg pain. She was admitted to a hospital in [place] on October 21 for a colon infection and treated with IV antibiotics. She states she had symptoms of upper abdominal pain, vomiting, diarrhea. She signed herself out of the hospital due to poor care since that time has had persistent epigastric pain. Her vomiting has resolved. She denies any melena or bloody stools. She denies any fevers. She has ongoing intermittent epigastric pain. She also reports pain in bilateral wrists and arms from IV sites during her hospital stay. 4 days ago she developed pain in her left calf. She denies any chest pain or shortness of breath. She is a nonsmoker. She is currently taking omeprazole without relief of her epigastric pain.  Past Medical History  Diagnosis Date  . Endometriosis   . Depression   . Anxiety    Past Surgical History  Procedure Laterality Date  . Abdominal hysterectomy      Still has ovaries   Family History  Problem Relation Age of Onset  . Anesthesia problems Neg Hx   . Hypotension Neg Hx   . Malignant hyperthermia Neg Hx   . Pseudochol deficiency Neg Hx    Social History  Substance Use Topics  . Smoking status: Never Smoker   . Smokeless tobacco: Never Used  . Alcohol Use: Yes     Comment: Reports she usually drinks a glass of wine a day but not had any in 1-2 months.    OB  History    Gravida Para Term Preterm AB TAB SAB Ectopic Multiple Living   1         1     Review of Systems  Gastrointestinal: Positive for abdominal pain.  Neurological: Positive for dizziness.  All other systems reviewed and are negative.     Allergies  Review of patient's allergies indicates no known allergies.  Home Medications   Prior to Admission medications   Medication Sig Start Date End Date Taking? Authorizing Provider  ALPRAZolam Duanne Moron) 0.5 MG tablet Take 0.5 mg by mouth 2 (two) times daily as needed for anxiety.   Yes Historical Provider, MD  omeprazole (PRILOSEC) 40 MG capsule Take 40 mg by mouth 2 (two) times daily before a meal.   Yes Historical Provider, MD  ALPRAZolam Duanne Moron) 0.25 MG tablet Take 1 po tid prn anxiety and 1 or 2 po qhs prn sleep Patient not taking: Reported on 05/17/2015 09/02/11   Orma Flaming, MD  cyclobenzaprine (FLEXERIL) 10 MG tablet Take 1 tablet (10 mg total) by mouth 2 (two) times daily as needed for muscle spasms. 05/04/15   Dorie Rank, MD  medroxyPROGESTERone (DEPO-PROVERA) 150 MG/ML injection Inject 150 mg into the muscle every 3 (three) months.    Historical Provider, MD  naproxen (NAPROSYN) 500 MG tablet Take 1 tablet (500 mg total) by  mouth 2 (two) times daily with a meal. As needed for pain 05/04/15   Dorie Rank, MD   BP 136/89 mmHg  Pulse 78  Temp(Src) 98.7 F (37.1 C) (Oral)  Resp 18  Ht 5\' 4"  (1.626 m)  Wt 165 lb (74.844 kg)  BMI 28.31 kg/m2  SpO2 98% Physical Exam  Constitutional: She is oriented to person, place, and time. She appears well-developed and well-nourished.  HENT:  Head: Normocephalic and atraumatic.  Cardiovascular: Normal rate and regular rhythm.   No murmur heard. Pulmonary/Chest: Effort normal and breath sounds normal. No respiratory distress.  Abdominal: Soft. There is no rebound and no guarding.  Mild epigastric tenderness  Musculoskeletal: She exhibits no edema.  No swelling of all 4 extremities.  There is a small palpable cord in the lateral AC of the left upper extremity without local erythema/edema. 2+ radial pulses bilaterally. 2+ DP pulses bilaterally.  Neurological: She is alert and oriented to person, place, and time.  Skin: Skin is warm and dry.  Psychiatric: She has a normal mood and affect. Her behavior is normal.  Nursing note and vitals reviewed.   ED Course  Procedures (including critical care time) Labs Review Labs Reviewed  COMPREHENSIVE METABOLIC PANEL - Abnormal; Notable for the following:    Glucose, Bld 101 (*)    All other components within normal limits  LIPASE, BLOOD  CBC WITH DIFFERENTIAL/PLATELET  I-STAT BETA HCG BLOOD, ED ([facility], [facility], [facility] ONLY)    Imaging Review No results found. I have personally reviewed and evaluated these images and lab results as part of my medical decision-making.   EKG Interpretation None      MDM   Final diagnoses:  Epigastric pain  Calf pain, left    Patient here for evaluation of epigastric pain as well as left calf pain, recently hospitalized for colitis. Presentation is not consistent with diverticulitis, cholecystitis. Discussed with patient follow-up with gastroenterology for possible endoscopy. Discussed continuing her omeprazole as prescribed, will start Carafate. Patient does also have left calf pain, DVT study is negative, no evidence of cellulitis on examination. Discussed with patient unclear cause of pain, recommend warm compresses to calf, arms.     Quintella Reichert, MD 05/17/15 1356

## 2015-05-17 NOTE — ED Notes (Signed)
Ultrasound at bedside

## 2015-05-17 NOTE — Progress Notes (Signed)
*  PRELIMINARY RESULTS* Vascular Ultrasound Left lower extremity venous duplex has been completed.  Preliminary findings: No evidence of DVT or baker's cyst.   Landry Mellow, RDMS, RVT  05/17/2015, 12:06 PM

## 2015-05-17 NOTE — Discharge Instructions (Signed)
Abdominal Pain, Adult Many things can cause abdominal pain. Usually, abdominal pain is not caused by a disease and will improve without treatment. It can often be observed and treated at home. Your health care provider will do a physical exam and possibly order blood tests and X-rays to help determine the seriousness of your pain. However, in many cases, more time must pass before a clear cause of the pain can be found. Before that point, your health care provider may not know if you need more testing or further treatment. HOME CARE INSTRUCTIONS Monitor your abdominal pain for any changes. The following actions may help to alleviate any discomfort you are experiencing:  Only take over-the-counter or prescription medicines as directed by your health care provider.  Do not take laxatives unless directed to do so by your health care provider.  Try a clear liquid diet (broth, tea, or water) as directed by your health care provider. Slowly move to a bland diet as tolerated. SEEK MEDICAL CARE IF:  You have unexplained abdominal pain.  You have abdominal pain associated with nausea or diarrhea.  You have pain when you urinate or have a bowel movement.  You experience abdominal pain that wakes you in the night.  You have abdominal pain that is worsened or improved by eating food.  You have abdominal pain that is worsened with eating fatty foods.  You have a fever. SEEK IMMEDIATE MEDICAL CARE IF:  Your pain does not go away within 2 hours.  You keep throwing up (vomiting).  Your pain is felt only in portions of the abdomen, such as the right side or the left lower portion of the abdomen.  You pass bloody or black tarry stools. MAKE SURE YOU:  Understand these instructions.  Will watch your condition.  Will get help right away if you are not doing well or get worse.   This information is not intended to replace advice given to you by your health care provider. Make sure you discuss  any questions you have with your health care provider.   Document Released: 04/02/2005 Document Revised: 03/14/2015 Document Reviewed: 03/02/2013 Elsevier Interactive Patient Education 2016 Elsevier Inc[employer]  Pain Without a Known Cause WHAT IS PAIN WITHOUT A KNOWN CAUSE? Pain can occur in any part of the body and can range from mild to severe. Sometimes no cause can be found for why you are having pain. Some types of pain that can occur without a known cause include:   Headache.  Back pain.  Abdominal pain.  Neck pain. HOW IS PAIN WITHOUT A KNOWN CAUSE DIAGNOSED?  Your health care provider will try to find the cause of your pain. This may include:  Physical exam.  Medical history.  Blood tests.  Urine tests.  X-rays. If no cause is found, your health care provider may diagnose you with pain without a known cause.  IS THERE TREATMENT FOR PAIN WITHOUT A CAUSE?  Treatment depends on the kind of pain you have. Your health care provider may prescribe medicines to help relieve your pain.  WHAT CAN I DO AT HOME FOR MY PAIN?   Take medicines only as directed by your health care provider.  Stop any activities that cause pain. During periods of severe pain, bed rest may help.  Try to reduce your stress with activities such as yoga or meditation. Talk to your health care provider for other stress-reducing activity recommendations.  Exercise regularly, if approved by your health care provider.  Eat a healthy  diet that includes fruits and vegetables. This may improve pain. Talk to your health care provider if you have any questions about your diet. °WHAT IF MY PAIN DOES NOT GET BETTER?  °If you have a painful condition and no reason can be found for the pain or the pain gets worse, it is important to follow up with your health care provider. It may be necessary to repeat tests and look further for a possible cause.  °  °This information is not intended to replace advice given to you by your  health care provider. Make sure you discuss any questions you have with your health care provider. °  °Document Released: 03/18/2001 Document Revised: 07/14/2014 Document Reviewed: 11/08/2013 °Elsevier Interactive Patient Education ©2016 Elsevier Inc. ° °

## 2015-06-07 ENCOUNTER — Other Ambulatory Visit: Payer: Self-pay | Admitting: Gastroenterology

## 2015-06-07 DIAGNOSIS — R1013 Epigastric pain: Secondary | ICD-10-CM

## 2015-06-13 ENCOUNTER — Encounter: Payer: PRIVATE HEALTH INSURANCE | Admitting: Obstetrics and Gynecology

## 2015-06-14 ENCOUNTER — Encounter: Payer: Self-pay | Admitting: Obstetrics and Gynecology

## 2015-06-14 ENCOUNTER — Ambulatory Visit (INDEPENDENT_AMBULATORY_CARE_PROVIDER_SITE_OTHER): Payer: BLUE CROSS/BLUE SHIELD | Admitting: Obstetrics and Gynecology

## 2015-06-14 VITALS — BP 112/70 | HR 88 | Resp 16 | Ht 63.5 in | Wt 172.0 lb

## 2015-06-14 DIAGNOSIS — N809 Endometriosis, unspecified: Secondary | ICD-10-CM

## 2015-06-14 DIAGNOSIS — M6289 Other specified disorders of muscle: Secondary | ICD-10-CM

## 2015-06-14 DIAGNOSIS — N941 Unspecified dyspareunia: Secondary | ICD-10-CM

## 2015-06-14 DIAGNOSIS — N8184 Pelvic muscle wasting: Secondary | ICD-10-CM

## 2015-06-14 MED ORDER — NORETHIN ACE-ETH ESTRAD-FE 1-20 MG-MCG PO TABS: 1.0000 | ORAL_TABLET | Freq: Every day | ORAL | Status: DC
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
## 2015-06-14 NOTE — Patient Instructions (Signed)
Endometriosis Endometriosis is a condition in which the tissue that lines the uterus (endometrium) grows outside of its normal location. The tissue may grow in many locations close to the uterus, but it commonly grows on the ovaries, fallopian tubes, vagina, or bowel. Because the uterus expels, or sheds, its lining every menstrual cycle, there is bleeding wherever the endometrial tissue is located. This can cause pain because blood is irritating to tissues not normally exposed to it.  CAUSES  The cause of endometriosis is not known.  SIGNS AND SYMPTOMS  Often, there are no symptoms. When symptoms are present, they can vary with the location of the displaced tissue. Various symptoms can occur at different times. Although symptoms occur mainly during a woman's menstrual period, they can also occur midcycle and usually stop with menopause. Some people may go months with no symptoms at all. Symptoms may include:   Back or abdominal pain.   Heavier bleeding during periods.   Pain during intercourse.   Painful bowel movements.   Infertility. DIAGNOSIS  Your health care provider will do a physical exam and ask about your symptoms. Various tests may be done, such as:   Blood tests and urine tests. These are done to help rule out other problems.   Ultrasound. This test is done to look for abnormal tissue.   An X-ray of the lower bowel (barium enema).  Laparoscopy. In this procedure, a thin, lighted tube with a tiny camera on the end (laparoscope) is inserted into your abdomen. This helps your health care provider look for abnormal tissue to confirm the diagnosis. The health care provider may also remove a small piece of tissue (biopsy) from any abnormal tissue found. This tissue sample can then be sent to a lab so it can be looked at under a microscope. TREATMENT  Treatment will vary and may include:   Medicines to relieve pain. Nonsteroidal anti-inflammatory drugs (NSAIDs) are a type of  pain medicine that can help to relieve the pain caused by endometriosis.  Hormonal therapy. When using hormonal therapy, periods are eliminated. This eliminates the monthly exposure to blood by the displaced endometrial tissue.   Surgery. Surgery may sometimes be done to remove the abnormal endometrial tissue. In severe cases, surgery may be done to remove the fallopian tubes, uterus, and ovaries (hysterectomy). HOME CARE INSTRUCTIONS   Take all medicines as directed by your health care provider. Do not take aspirin because it may increase bleeding when you are not on hormonal therapy.   Avoid activities that produce pain, including sexual activity. SEEK MEDICAL CARE IF:  You have pelvic pain before, after, or during your periods.  You have pelvic pain between periods that gets worse during your period.  You have pelvic pain during or after sex.  You have pelvic pain with bowel movements or urination, especially during your period.  You have problems getting pregnant.  You have a fever. SEEK IMMEDIATE MEDICAL CARE IF:   Your pain is severe and is not responding to pain medicine.   You have severe nausea and vomiting, or you cannot keep foods down.   You have pain that is limited to the right lower part of your abdomen.   You have swelling or increasing pain in your abdomen.   You see blood in your stool.  MAKE SURE YOU:   Understand these instructions.  Will watch your condition.  Will get help right away if you are not doing well or get worse.   This information   is not intended to replace advice given to you by your health care provider. Make sure you discuss any questions you have with your health care provider.   Document Released: 06/20/2000 Document Revised: 07/14/2014 Document Reviewed: 02/18/2013 Elsevier Interactive Patient Education 2016 [employer]. Oral Contraception Information Oral contraceptive pills (OCPs) are medicines taken to prevent  pregnancy. OCPs work by preventing the ovaries from releasing eggs. The hormones in OCPs also cause the cervical mucus to thicken, preventing the sperm from entering the uterus. The hormones also cause the uterine lining to become thin, not allowing a fertilized egg to attach to the inside of the uterus. OCPs are highly effective when taken exactly as prescribed. However, OCPs do not prevent sexually transmitted diseases (STDs). Safe sex practices, such as using condoms along with the pill, can help prevent STDs.  Before taking the pill, you may have a physical exam and Pap test. Your health care provider may order blood tests. The health care provider will make sure you are a good candidate for oral contraception. Discuss with your health care provider the possible side effects of the OCP you may be prescribed. When starting an OCP, it can take 2 to 3 months for the body to adjust to the changes in hormone levels in your body.  TYPES OF ORAL CONTRACEPTION  The combination pill--This pill contains estrogen and progestin (synthetic progesterone) hormones. The combination pill comes in 21-day, 28-day, or 91-day packs. Some types of combination pills are meant to be taken continuously (365-day pills). With 21-day packs, you do not take pills for 7 days after the last pill. With 28-day packs, the pill is taken every day. The last 7 pills are without hormones. Certain types of pills have more than 21 hormone-containing pills. With 91-day packs, the first 84 pills contain both hormones, and the last 7 pills contain no hormones or contain estrogen only.  The minipill--This pill contains the progesterone hormone only. The pill is taken every day continuously. It is very important to take the pill at the same time each day. The minipill comes in packs of 28 pills. All 28 pills contain the hormone.  ADVANTAGES OF ORAL CONTRACEPTIVE PILLS  Decreases premenstrual symptoms.   Treats menstrual period cramps.    Regulates the menstrual cycle.   Decreases a heavy menstrual flow.   May treatacne, depending on the type of pill.   Treats abnormal uterine bleeding.   Treats polycystic ovarian syndrome.   Treats endometriosis.   Can be used as emergency contraception.  THINGS THAT CAN MAKE ORAL CONTRACEPTIVE PILLS LESS EFFECTIVE OCPs can be less effective if:   You forget to take the pill at the same time every day.   You have a stomach or intestinal disease that lessens the absorption of the pill.   You take OCPs with other medicines that make OCPs less effective, such as antibiotics, certain HIV medicines, and some seizure medicines.   You take expired OCPs.   You forget to restart the pill on day 7, when using the packs of 21 pills.  RISKS ASSOCIATED WITH ORAL CONTRACEPTIVE PILLS  Oral contraceptive pills can sometimes cause side effects, such as:  Headache.  Nausea.  Breast tenderness.  Irregular bleeding or spotting. Combination pills are also associated with a small increased risk of:  Blood clots.  Heart attack.  Stroke.   This information is not intended to replace advice given to you by your health care provider. Make sure you discuss any questions you have  with your health care provider.   Document Released: 09/13/2002 Document Revised: 04/13/2013 Document Reviewed: 12/12/2012 Elsevier Interactive Patient Education [employer].

## 2015-06-19 ENCOUNTER — Ambulatory Visit (HOSPITAL_COMMUNITY): Payer: BLUE CROSS/BLUE SHIELD

## 2015-07-05 ENCOUNTER — Telehealth: Payer: Self-pay | Admitting: Obstetrics and Gynecology

## 2015-07-05 NOTE — Telephone Encounter (Signed)
Op note reviewed. In 7/07 she had a LAVH. NO endometriosis seen.

## 2015-07-08 HISTORY — PX: BREAST BIOPSY: SHX20
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
## 2015-09-07 ENCOUNTER — Telehealth: Payer: Self-pay | Admitting: Obstetrics and Gynecology

## 2015-09-07 NOTE — Telephone Encounter (Addendum)
Patient is out of town in [place] and is requesting a refill for Junell Fe to be sent to [facility] at [phone].

## 2015-09-07 NOTE — Telephone Encounter (Signed)
Left patient detailed message letting her know that she can call the pharmacy that she wants to use in [place] & [facility] [place] & transfer 81mth of birth control. Pt told to call if any problems.

## 2015-09-07 NOTE — Telephone Encounter (Signed)
Pt aware.

## 2015-09-07 NOTE — Telephone Encounter (Signed)
Pt is taking pill continuous & has 58month recheck 09-27-15 with dr Talbert Nan.  Rx for 53month called [facility] [place].

## 2015-09-12 ENCOUNTER — Ambulatory Visit: Payer: BLUE CROSS/BLUE SHIELD | Admitting: Obstetrics and Gynecology

## 2015-09-27 ENCOUNTER — Encounter: Payer: Self-pay | Admitting: Obstetrics and Gynecology

## 2015-09-27 ENCOUNTER — Ambulatory Visit (INDEPENDENT_AMBULATORY_CARE_PROVIDER_SITE_OTHER): Payer: BLUE CROSS/BLUE SHIELD | Admitting: Obstetrics and Gynecology

## 2015-09-27 ENCOUNTER — Ambulatory Visit: Payer: BLUE CROSS/BLUE SHIELD | Admitting: Obstetrics and Gynecology

## 2015-09-27 VITALS — BP 122/88 | HR 84 | Resp 16 | Wt 177.0 lb

## 2015-09-27 DIAGNOSIS — R102 Pelvic and perineal pain: Secondary | ICD-10-CM

## 2015-09-27 DIAGNOSIS — Z3009 Encounter for other general counseling and advice on contraception: Secondary | ICD-10-CM

## 2015-09-27 DIAGNOSIS — Z8742 Personal history of other diseases of the female genital tract: Secondary | ICD-10-CM | POA: Diagnosis not present

## 2015-09-27 DIAGNOSIS — Z3042 Encounter for surveillance of injectable contraceptive: Secondary | ICD-10-CM

## 2015-09-27 MED ORDER — MEDROXYPROGESTERONE ACETATE 150 MG/ML IM SUSP
150.0000 mg | Freq: Once | INTRAMUSCULAR | Status: AC
  Administered 2015-09-27: 150 mg via INTRAMUSCULAR

## 2015-09-27 NOTE — Progress Notes (Signed)
Patient ID: [name], female   DOB: 03/27/1976, 40 y.o.   MRN: [id number] GYNECOLOGY  VISIT   HPI: 40 y.o.   Married  [place] American  female   G1P0 with No LMP recorded. Patient has had a hysterectomy.   here to follow up on OCP. She forgets to take it and wants to discuss other options She has a h/o endometriosis. In 2007 she had a LAVH, no endometriosis was seen. A little over a year ago she was started on lupron for pelvic pain, this helped. 3 months ago she was switched to continuous OCP's. She is forgetting to take the pill sometimes. She has some nausea on the pill, it helps to take it at night, but then she forgets it. About once a month she is having pain deep in her pelvis, near her rectum. The pain lasts for a couple of days off and on. Varies from mild to severe. The pain can last for 1-10 minutes at a time.   GYNECOLOGIC HISTORY: No LMP recorded. Patient has had a hysterectomy. Contraception: partial hysterectomy  OCP Menopausal hormone therapy: NONE        OB History    Gravida Para Term Preterm AB TAB SAB Ectopic Multiple Living   1         1         There are no active problems to display for this patient.   Past Medical History  Diagnosis Date  . Endometriosis   . Depression   . Anxiety   . Hormone disorder   . Herniated disc, cervical   . Neck pain   . Migraine without aura     Past Surgical History  Procedure Laterality Date  . Abdominal hysterectomy      Still has ovaries    Current Outpatient Prescriptions  Medication Sig Dispense Refill  . diphenhydramine-acetaminophen (TYLENOL PM) 25-500 MG TABS tablet Take 2 tablets by mouth at bedtime as needed (sleep and pain).    . norethindrone-ethinyl estradiol (JUNEL FE,GILDESS FE,LOESTRIN FE) 1-20 MG-MCG tablet Take 1 tablet by mouth daily. Skip the placebo pills, take continuously 4 Package 0   No current facility-administered medications for this visit.     ALLERGIES: Amoxicillin  Family  History  Problem Relation Age of Onset  . Anesthesia problems Neg Hx   . Hypotension Neg Hx   . Malignant hyperthermia Neg Hx   . Pseudochol deficiency Neg Hx   . Sarcoidosis Mother   . Hypertension Father   . Cancer Maternal Grandfather   . Cancer Paternal Grandmother   . Bladder Cancer Paternal Grandmother   . Cancer Paternal Grandfather     Social History   Social History  . Marital Status: Married    Spouse Name: N/A  . Number of Children: N/A  . Years of Education: N/A   Occupational History  . Not on file.   Social History Main Topics  . Smoking status: Never Smoker   . Smokeless tobacco: Never Used  . Alcohol Use: No  . Drug Use: No  . Sexual Activity:    Partners: Male   Other Topics Concern  . Not on file   Social History Narrative    Review of Systems  Constitutional: Negative.   HENT: Negative.   Eyes: Negative.   Respiratory: Negative.   Cardiovascular: Negative.   Gastrointestinal: Negative.   Genitourinary: Negative.   Musculoskeletal: Negative.   Skin: Negative.   Neurological: Negative.   Endo/Heme/Allergies: Negative.  Psychiatric/Behavioral: Negative.     PHYSICAL EXAMINATION:    BP 122/88 mmHg  Pulse 84  Resp 16  Wt 177 lb (80.287 kg)    General appearance: alert, cooperative and appears stated age  ASSESSMENT H/O endometriosis and pelvic pain S/P LAVH She would like to stop ocps and start depo-provera. Reviewed risks including weight gain and bone loss.    PLAN Start depo-provera Use OCP's for another week F/U in 3 months for an annual and another depo-provera.   An After Visit Summary was printed and given to the patient.  15 minutes face to face time of which over 50% was spent in counseling.

## 2015-09-27 NOTE — Addendum Note (Signed)
Addended by: Susanne Greenhouse E on: 09/27/2015 03:07 PM   Modules accepted: Orders

## 2015-09-27 NOTE — Patient Instructions (Signed)

## 2015-11-05 DIAGNOSIS — C50919 Malignant neoplasm of unspecified site of unspecified female breast: Secondary | ICD-10-CM

## 2015-11-05 HISTORY — DX: Malignant neoplasm of unspecified site of unspecified female breast: C50.919

## 2015-11-29 ENCOUNTER — Ambulatory Visit: Payer: BLUE CROSS/BLUE SHIELD | Admitting: Obstetrics and Gynecology

## 2015-12-04 ENCOUNTER — Emergency Department (HOSPITAL_COMMUNITY)
Admission: EM | Admit: 2015-12-04 | Discharge: 2015-12-05 | Disposition: A | Discharge status: Home / Self Care | Payer: BLUE CROSS/BLUE SHIELD | Attending: Emergency Medicine | Admitting: Emergency Medicine

## 2015-12-04 ENCOUNTER — Emergency Department (HOSPITAL_COMMUNITY): Payer: BLUE CROSS/BLUE SHIELD

## 2015-12-04 ENCOUNTER — Encounter (HOSPITAL_COMMUNITY): Payer: Self-pay | Admitting: Emergency Medicine

## 2015-12-04 DIAGNOSIS — R0789 Other chest pain: Secondary | ICD-10-CM | POA: Insufficient documentation

## 2015-12-04 DIAGNOSIS — Z79899 Other long term (current) drug therapy: Secondary | ICD-10-CM | POA: Insufficient documentation

## 2015-12-04 DIAGNOSIS — Z853 Personal history of malignant neoplasm of breast: Secondary | ICD-10-CM | POA: Diagnosis not present

## 2015-12-04 DIAGNOSIS — R079 Chest pain, unspecified: Secondary | ICD-10-CM | POA: Diagnosis present

## 2015-12-04 HISTORY — DX: Malignant neoplasm of unspecified site of unspecified female breast: C50.919

## 2015-12-04 LAB — BASIC METABOLIC PANEL
Anion gap: 6 (ref 5–15)
BUN: 13 mg/dL (ref 6–20)
CO2: 25 mmol/L (ref 22–32)
Calcium: 9.7 mg/dL (ref 8.9–10.3)
Chloride: 107 mmol/L (ref 101–111)
Creatinine, Ser: 0.78 mg/dL (ref 0.44–1.00)
GFR calc Af Amer: >60 mL/min (ref >60)
GFR calc non Af Amer: >60 mL/min (ref >60)
Glucose, Bld: 89 mg/dL (ref 65–99)
Potassium: 3.9 mmol/L (ref 3.5–5.1)
Sodium: 138 mmol/L (ref 135–145)

## 2015-12-04 LAB — CBC
HCT: 38.5 % (ref 36.0–46.0)
Hemoglobin: 12.9 g/dL (ref 12.0–15.0)
MCH: 28.5 pg (ref 26.0–34.0)
MCHC: 33.5 g/dL (ref 30.0–36.0)
MCV: 85 fL (ref 78.0–100.0)
Platelets: 323 10*3/uL (ref 150–400)
RBC: 4.53 MIL/uL (ref 3.87–5.11)
RDW: 12.9 % (ref 11.5–15.5)
WBC: 9.6 10*3/uL (ref 4.0–10.5)

## 2015-12-04 LAB — I-STAT TROPONIN, ED: Troponin i, poc: 0 ng/mL (ref 0.00–0.08)

## 2015-12-04 MED ORDER — ASPIRIN 81 MG PO CHEW
324.0000 mg | CHEWABLE_TABLET | Freq: Once | ORAL | Status: AC
  Administered 2015-12-05: 324 mg via ORAL
  Filled 2015-12-04: qty 4

## 2015-12-04 NOTE — ED Notes (Signed)
Pt sts left sided CP worse with movement and cough x 3 days; pt sts recent diagnosis of left sided breast CA

## 2015-12-04 NOTE — ED Provider Notes (Signed)
CSN: [id number]     Arrival date & time 12/04/15  1846 History   First MD Initiated Contact with Patient 12/04/15 2253     Chief Complaint  Patient presents with  . Chest Pain     (Consider location/radiation/quality/duration/timing/severity/associated sxs/prior Treatment) HPI [name] is a 40 y.o. female with PMH significant for endometriosis, depression, anxiety, and recent diagnosis of breast cancer (last week) who presents with 3 day history of intermittent, left sided chest pain that radiates to the left arm, described as a dull ache, that will last a couple of minutes and go away.  Aggravating factors include deep inspiration, coughing, laughing, and exertion.  She reports taking Tramadol with minimal relief.  Wakes her from sleep. Associated symptoms include SOB with pain.  Denies fever, cough, N/V, abdominal pain, unilateral leg swelling.  No cardiac history.  No hx of DVT/PE.  No tobacco use.  She does take Depo Provera.   Past Medical History  Diagnosis Date  . Endometriosis   . Depression   . Anxiety   . Hormone disorder   . Herniated disc, cervical   . Neck pain   . Migraine without aura   . Breast cancer [facility])    Past Surgical History  Procedure Laterality Date  . Abdominal hysterectomy      Still has ovaries   Family History  Problem Relation Age of Onset  . Anesthesia problems Neg Hx   . Hypotension Neg Hx   . Malignant hyperthermia Neg Hx   . Pseudochol deficiency Neg Hx   . Sarcoidosis Mother   . Hypertension Father   . Cancer Maternal Grandfather   . Cancer Paternal Grandmother   . Bladder Cancer Paternal Grandmother   . Cancer Paternal Grandfather    Social History  Substance Use Topics  . Smoking status: Never Smoker   . Smokeless tobacco: Never Used  . Alcohol Use: No   OB History    Gravida Para Term Preterm AB TAB SAB Ectopic Multiple Living   1         1     Review of Systems All other systems negative unless otherwise stated in  HPI    Allergies  Amoxicillin  Home Medications   Prior to Admission medications   Medication Sig Start Date End Date Taking? Authorizing Provider  diphenhydramine-acetaminophen (TYLENOL PM) 25-500 MG TABS tablet Take 2 tablets by mouth at bedtime as needed (sleep and pain).   Yes Historical Provider, MD  medroxyPROGESTERone (DEPO-PROVERA) 150 MG/ML injection Inject 150 mg into the muscle every 3 (three) months.   Yes Historical Provider, MD  traMADol (ULTRAM) 50 MG tablet Take 50 mg by mouth every 6 (six) hours as needed for moderate pain.   Yes Historical Provider, MD  norethindrone-ethinyl estradiol (JUNEL FE,GILDESS FE,LOESTRIN FE) 1-20 MG-MCG tablet Take 1 tablet by mouth daily. Skip the placebo pills, take continuously 06/14/15   [name], MD   BP 125/84 mmHg  Pulse 73  Temp(Src) 98.4 F (36.9 C) (Oral)  Resp 17  Wt 81.647 kg  SpO2 100% Physical Exam  Constitutional: She is oriented to person, place, and time. She appears well-developed and well-nourished.  Non-toxic appearance. She does not have a sickly appearance. She does not appear ill.  HENT:  Head: Normocephalic and atraumatic.  Mouth/Throat: Oropharynx is clear and moist.  Eyes: Conjunctivae are normal. Pupils are equal, round, and reactive to light.  Neck: Normal range of motion. Neck supple.  Cardiovascular: Normal rate and  regular rhythm.   Pulmonary/Chest: Effort normal and breath sounds normal. No accessory muscle usage or stridor. No respiratory distress. She has no wheezes. She has no rhonchi. She has no rales. She exhibits no tenderness.  Oxygen saturation 100% on RA.   Abdominal: Soft. Bowel sounds are normal. She exhibits no distension. There is no tenderness. There is no rebound and no guarding.  Musculoskeletal: Normal range of motion.  Lymphadenopathy:    She has no cervical adenopathy.  Neurological: She is alert and oriented to person, place, and time.  Speech clear without dysarthria.   Skin: Skin is warm and dry.  Psychiatric: She has a normal mood and affect. Her behavior is normal.    ED Course  Procedures (including critical care time) [place], ED  Randolm Idol, ED    Imaging Review Dg Chest 2 View  12/04/2015  CLINICAL DATA:  Acute onset of left-sided chest pain and cough. Initial encounter. EXAM: CHEST  2 VIEW COMPARISON:  Thoracic spine radiographs performed 04/29/2014 FINDINGS: The lungs are well-aerated and clear. There is no evidence of focal opacification, pleural effusion or pneumothorax. The heart is normal in size; the mediastinal contour is within normal limits. No acute osseous abnormalities are seen. IMPRESSION: No acute cardiopulmonary process seen. Electronically Signed   By: Garald Balding M.D.   On: 12/04/2015 19:53   Ct Angio Chest Pe W/cm &/or Wo Cm  12/05/2015  CLINICAL DATA:  Left-sided chest pain. Cough. Symptoms for 3 days. Recent diagnosis of left breast cancer. EXAM: CT ANGIOGRAPHY CHEST WITH CONTRAST TECHNIQUE: Multidetector CT imaging of the chest was performed using the standard protocol during bolus administration of intravenous contrast. Multiplanar CT image reconstructions and MIPs were obtained to evaluate the vascular anatomy. CONTRAST:  100 cc Isovue 370 IV COMPARISON:  Chest radiograph 1 day prior. FINDINGS: There are no filling defects within the pulmonary arteries to suggest pulmonary embolus to the level of the segmental branches. The subsegmental branches cannot be assessed due to contrast bolus timing. Normal caliber thoracic aorta without evidence of dissection. There is a prominent right hilar lymph node measuring 12 mm. Subcarinal lymph node measures 10 mm. No left hilar adenopathy. No pericardial effusion. Heterogeneous attenuation of the lower lobes may be due to air trapping or atelectasis. No confluent consolidation. Linear atelectasis in the right upper lobe.  No pulmonary nodule or mass. No pleural effusion. No enlarged axillary lymph nodes. Nodule in the left breast measures 13 mm. No acute abnormality in the upper abdomen. There are no acute or suspicious osseous abnormalities. No blastic or destructive lytic lesions. Review of the MIP images confirms the above findings. IMPRESSION: 1. No pulmonary embolus. 2. Prominent right hilar lymph node measures 12 mm, nonspecific. 3. Heterogeneous attenuation of the lower lobes may be due to air trapping or atelectasis. Electronically Signed   By: Jeb Levering M.D.   On: 12/05/2015 02:43   I have personally reviewed and evaluated these images and lab results as part of my medical decision-making.   EKG Interpretation None      MDM   Final diagnoses:  Atypical chest pain   Patient presents with intermittent chest pain.  Recently dxed with breast cancer.  On exogenous estrogen.  VSS, NAD.  On exam, heart RRR, lungs CTAB, abdomen soft and benign.  No chest wall tenderness.  DDx includes PE, ACS, PNA.  Low suspicion for AD. EKG shows NSR, troponin x 2  negative.  HEART score 1, low suspicion for ACS.  Labs without acute abnormalities. Moderate risk using Well's criteria for PE, will obtain CTA. CTA negative for PE.  Prominent right hilar lymph node measures 12 mm, nonspecific.  Heterogeneous attenuation of lower lobes may be due to air trapping or atelectasis. Evaluation does not show pathology requiring ongoing emergent intervention or admission. Pt is hemodynamically stable and mentating appropriately. Discussed findings/results and plan with patient/guardian, who agrees with plan. Patient has tramadol at home for pain.  All questions answered. Return precautions discussed and outpatient follow up given.   Case has been discussed with Dr. Venora Maples who agrees with the above plan for discharge.         Gloriann Loan, PA-C 12/05/15 [place], MD 12/05/15 [phone]

## 2015-12-05 ENCOUNTER — Encounter (HOSPITAL_COMMUNITY): Payer: Self-pay | Admitting: Radiology

## 2015-12-05 ENCOUNTER — Ambulatory Visit (INDEPENDENT_AMBULATORY_CARE_PROVIDER_SITE_OTHER): Payer: BLUE CROSS/BLUE SHIELD | Admitting: Obstetrics and Gynecology

## 2015-12-05 ENCOUNTER — Emergency Department (HOSPITAL_COMMUNITY): Payer: BLUE CROSS/BLUE SHIELD

## 2015-12-05 ENCOUNTER — Encounter: Payer: Self-pay | Admitting: Obstetrics and Gynecology

## 2015-12-05 VITALS — BP 130/82 | HR 88 | Resp 12 | Ht 63.5 in | Wt 184.0 lb

## 2015-12-05 DIAGNOSIS — C50412 Malignant neoplasm of upper-outer quadrant of left female breast: Secondary | ICD-10-CM | POA: Diagnosis not present

## 2015-12-05 LAB — I-STAT TROPONIN, ED: Troponin i, poc: 0 ng/mL (ref 0.00–0.08)

## 2015-12-05 MED ORDER — ZOLPIDEM TARTRATE 10 MG PO TABS
ORAL_TABLET | ORAL | Status: DC
Start: 2015-12-05 — End: 2016-02-12

## 2015-12-05 MED ORDER — TRAMADOL HCL 50 MG PO TABS
50.0000 mg | ORAL_TABLET | Freq: Once | ORAL | Status: AC
  Administered 2015-12-05: 50 mg via ORAL
  Filled 2015-12-05: qty 1

## 2015-12-05 MED ORDER — IOPAMIDOL (ISOVUE-370) INJECTION 76%
INTRAVENOUS | Status: AC
  Administered 2015-12-05: 100 mL
  Filled 2015-12-05: qty 100

## 2015-12-05 NOTE — Discharge Instructions (Signed)
Your work up today showed no acute abnormalities.  CT scan of your chest did not show evidence of pulmonary embolus.  Your pain is likely related to the tumor in your left breast.  Continue taking Tramadol for your pain.  Please follow up with your primary care doctor.  Return to the ED if you experience worsening chest pain, shortness of breath, or worsening symptoms.   Nonspecific Chest Pain  Chest pain can be caused by many different conditions. There is always a chance that your pain could be related to something serious, such as a heart attack or a blood clot in your lungs. Chest pain can also be caused by conditions that are not life-threatening. If you have chest pain, it is very important to follow up with your health care provider. CAUSES  Chest pain can be caused by:  Heartburn.  Pneumonia or bronchitis.  Anxiety or stress.  Inflammation around your heart (pericarditis) or lung (pleuritis or pleurisy).  A blood clot in your lung.  A collapsed lung (pneumothorax). It can develop suddenly on its own (spontaneous pneumothorax) or from trauma to the chest.  Shingles infection (varicella-zoster virus).  Heart attack.  Damage to the bones, muscles, and cartilage that make up your chest wall. This can include:  Bruised bones due to injury.  Strained muscles or cartilage due to frequent or repeated coughing or overwork.  Fracture to one or more ribs.  Sore cartilage due to inflammation (costochondritis). RISK FACTORS  Risk factors for chest pain may include:  Activities that increase your risk for trauma or injury to your chest.  Respiratory infections or conditions that cause frequent coughing.  Medical conditions or overeating that can cause heartburn.  Heart disease or family history of heart disease.  Conditions or health behaviors that increase your risk of developing a blood clot.  Having had chicken pox (varicella zoster). SIGNS AND SYMPTOMS Chest pain can feel  like:  Burning or tingling on the surface of your chest or deep in your chest.  Crushing, pressure, aching, or squeezing pain.  Dull or sharp pain that is worse when you move, cough, or take a deep breath.  Pain that is also felt in your back, neck, shoulder, or arm, or pain that spreads to any of these areas. Your chest pain may come and go, or it may stay constant. DIAGNOSIS Lab tests or other studies may be needed to find the cause of your pain. Your health care provider may have you take a test called an ambulatory ECG (electrocardiogram). An ECG records your heartbeat patterns at the time the test is performed. You may also have other tests, such as:  Transthoracic echocardiogram (TTE). During echocardiography, sound waves are used to create a picture of all of the heart structures and to look at how blood flows through your heart.  Transesophageal echocardiogram (TEE).This is a more advanced imaging test that obtains images from inside your body. It allows your health care provider to see your heart in finer detail.  Cardiac monitoring. This allows your health care provider to monitor your heart rate and rhythm in real time.  Holter monitor. This is a portable device that records your heartbeat and can help to diagnose abnormal heartbeats. It allows your health care provider to track your heart activity for several days, if needed.  Stress tests. These can be done through exercise or by taking medicine that makes your heart beat more quickly.  Blood tests.  Imaging tests. TREATMENT  Your treatment  depends on what is causing your chest pain. Treatment may include:  Medicines. These may include:  Acid blockers for heartburn.  Anti-inflammatory medicine.  Pain medicine for inflammatory conditions.  Antibiotic medicine, if an infection is present.  Medicines to dissolve blood clots.  Medicines to treat coronary artery disease.  Supportive care for conditions that do not  require medicines. This may include:  Resting.  Applying heat or cold packs to injured areas.  Limiting activities until pain decreases. HOME CARE INSTRUCTIONS  If you were prescribed an antibiotic medicine, finish it all even if you start to feel better.  Avoid any activities that bring on chest pain.  Do not use any tobacco products, including cigarettes, chewing tobacco, or electronic cigarettes. If you need help quitting, ask your health care provider.  Do not drink alcohol.  Take medicines only as directed by your health care provider.  Keep all follow-up visits as directed by your health care provider. This is important. This includes any further testing if your chest pain does not go away.  If heartburn is the cause for your chest pain, you may be told to keep your head raised (elevated) while sleeping. This reduces the chance that acid will go from your stomach into your esophagus.  Make lifestyle changes as directed by your health care provider. These may include:  Getting regular exercise. Ask your health care provider to suggest some activities that are safe for you.  Eating a heart-healthy diet. A registered dietitian can help you to learn healthy eating options.  Maintaining a healthy weight.  Managing diabetes, if necessary.  Reducing stress. SEEK MEDICAL CARE IF:  Your chest pain does not go away after treatment.  You have a rash with blisters on your chest.  You have a fever. SEEK IMMEDIATE MEDICAL CARE IF:   Your chest pain is worse.  You have an increasing cough, or you cough up blood.  You have severe abdominal pain.  You have severe weakness.  You faint.  You have chills.  You have sudden, unexplained chest discomfort.  You have sudden, unexplained discomfort in your arms, back, neck, or jaw.  You have shortness of breath at any time.  You suddenly start to sweat, or your skin gets clammy.  You feel nauseous or you vomit.  You  suddenly feel light-headed or dizzy.  Your heart begins to beat quickly, or it feels like it is skipping beats. These symptoms may represent a serious problem that is an emergency. Do not wait to see if the symptoms will go away. Get medical help right away. Call your local emergency services (911 in the [place] Do not drive yourself to the hospital.   This information is not intended to replace advice given to you by your health care provider. Make sure you discuss any questions you have with your health care provider.   Document Released: 04/02/2005 Document Revised: 07/14/2014 Document Reviewed: 01/27/2014 Elsevier Interactive Patient Education [employer].

## 2015-12-05 NOTE — Progress Notes (Signed)
GYNECOLOGY  VISIT   HPI: 40 y.o.   Married  [place] American  female   G1P1001 with No LMP recorded. Patient has had a hysterectomy.   here for  Referral to Oncologist.  The patient was just diagnosed with breast cancer (in [place]) and has moved back home to get care here. She needs referral to a breast surgeon and oncologist. Biopsy from 11/20/15: invasive ductal carcinoma, moderately differentiated, Nottingham grade 2, ER +, PR negative, HER 2 equivocal.  The patient has had a hysterectomy, but is on depo-provera for a h/o endometriosis and pelvic pain  GYNECOLOGIC HISTORY: No LMP recorded. Patient has had a hysterectomy. Contraception:Hysterectomy  Menopausal hormone therapy: none        OB History    Gravida Para Term Preterm AB TAB SAB Ectopic Multiple Living   1 1 1       1          There are no active problems to display for this patient.   Past Medical History  Diagnosis Date  . Endometriosis   . Depression   . Anxiety   . Hormone disorder   . Herniated disc, cervical   . Neck pain   . Migraine without aura   . Breast cancer [facility])     Past Surgical History  Procedure Laterality Date  . Abdominal hysterectomy      Still has ovaries    Current Outpatient Prescriptions  Medication Sig Dispense Refill  . diphenhydramine-acetaminophen (TYLENOL PM) 25-500 MG TABS tablet Take 2 tablets by mouth at bedtime as needed (sleep and pain).    . medroxyPROGESTERone (DEPO-PROVERA) 150 MG/ML injection Inject 150 mg into the muscle every 3 (three) months.    . traMADol (ULTRAM) 50 MG tablet Take 50 mg by mouth every 6 (six) hours as needed for moderate pain.    Marland Kitchen zolpidem (AMBIEN) 10 MG tablet Take 1/2 to 1 tab po qhs prn 30 tablet 0   No current facility-administered medications for this visit.     ALLERGIES: Amoxicillin  Family History  Problem Relation Age of Onset  . Anesthesia problems Neg Hx   . Hypotension Neg Hx   . Malignant hyperthermia Neg Hx   . Pseudochol  deficiency Neg Hx   . Sarcoidosis Mother   . Hypertension Father   . Cancer Maternal Grandfather   . Cancer Paternal Grandmother   . Bladder Cancer Paternal Grandmother   . Cancer Paternal Grandfather   . Breast cancer Paternal Aunt     Social History   Social History  . Marital Status: Married    Spouse Name: N/A  . Number of Children: N/A  . Years of Education: N/A   Occupational History  . Not on file.   Social History Main Topics  . Smoking status: Never Smoker   . Smokeless tobacco: Never Used  . Alcohol Use: No  . Drug Use: No  . Sexual Activity:    Partners: Male   Other Topics Concern  . Not on file   Social History Narrative    Review of Systems  Constitutional: Negative.   HENT: Negative.   Eyes: Negative.   Respiratory: Negative.   Cardiovascular: Negative.   Gastrointestinal: Negative.   Genitourinary: Negative.   Musculoskeletal: Negative.   Skin: Negative.   Neurological: Negative.   Endo/Heme/Allergies: Negative.   Psychiatric/Behavioral: Negative.     PHYSICAL EXAMINATION:    BP 130/82 mmHg  Pulse 88  Resp 12  Ht 5' 3.5" (1.613 m)  Wt 184 lb (83.462 kg)  BMI 32.08 kg/m2    General appearance: alert, cooperative and appears stated age  ASSESSMENT Left breast biopsy with invasive ductal carcinoma, moderately differentiated, Nottingham grade 2, ER +, PR negative, HER 2 equivocal.    PLAN Will refer to Breast Surgeon and Oncologist Stop Depo-provera. Discussed that the treatment for breast cancer often leads to menopause. She has a h/o endometriosis.    An After Visit Summary was printed and given to the patient.  10 minutes face to face time of which over 50% was spent in counseling.

## 2015-12-05 NOTE — ED Provider Notes (Signed)
ECG interpretation   Date: 12/05/2015  Rate: 82  Rhythm: normal sinus rhythm  QRS Axis: normal  Intervals: normal  ST/T Wave abnormalities: normal  Conduction Disutrbances: none  Narrative Interpretation:   Old EKG Reviewed: No significant changes noted     Jola Schmidt, MD 12/05/15 [phone]

## 2015-12-07 ENCOUNTER — Telehealth: Payer: Self-pay | Admitting: Oncology

## 2015-12-07 ENCOUNTER — Encounter: Payer: Self-pay | Admitting: Oncology

## 2015-12-07 ENCOUNTER — Telehealth: Payer: Self-pay | Admitting: *Deleted

## 2015-12-07 NOTE — Telephone Encounter (Signed)
Patient returned my call to schedule appointment. Appointment scheduled for 6/6 w/Magrinat @4 :30. Aware to arrive 30 minutes early. Demographics verified. Letter mailed to referring.

## 2015-12-07 NOTE — Telephone Encounter (Signed)
Received appt date/time from Andrea.  Placed a note for an intake form to be given to the pt at time of check in. 

## 2015-12-10 ENCOUNTER — Other Ambulatory Visit: Payer: Self-pay | Admitting: *Deleted

## 2015-12-10 DIAGNOSIS — C50919 Malignant neoplasm of unspecified site of unspecified female breast: Secondary | ICD-10-CM

## 2015-12-11 ENCOUNTER — Other Ambulatory Visit: Payer: BLUE CROSS/BLUE SHIELD

## 2015-12-11 ENCOUNTER — Ambulatory Visit (HOSPITAL_BASED_OUTPATIENT_CLINIC_OR_DEPARTMENT_OTHER): Payer: BLUE CROSS/BLUE SHIELD | Admitting: Oncology

## 2015-12-11 VITALS — BP 136/100 | HR 82 | Temp 98.9°F | Resp 18 | Ht 63.5 in | Wt 180.3 lb

## 2015-12-11 DIAGNOSIS — C50412 Malignant neoplasm of upper-outer quadrant of left female breast: Secondary | ICD-10-CM | POA: Insufficient documentation

## 2015-12-11 DIAGNOSIS — Z170 Estrogen receptor positive status [ER+]: Secondary | ICD-10-CM | POA: Diagnosis not present

## 2015-12-11 MED ORDER — TRAMADOL HCL 50 MG PO TABS
50.0000 mg | ORAL_TABLET | Freq: Four times a day (QID) | ORAL | Status: DC | PRN
Start: 2015-12-11 — End: 2015-12-28

## 2015-12-11 MED ORDER — TAMOXIFEN CITRATE 20 MG PO TABS: 20.0000 mg | ORAL_TABLET | Freq: Every day | ORAL | Status: DC

## 2015-12-11 NOTE — Progress Notes (Signed)
[facility]  [phone] [phone]     ID: [name] DOB: 26-Jun-1976  MR#: [id number]  HUT#:[id number]  Patient Care Team: No Pcp Per Patient as PCP - General (General Practice) Jordan Cruel, MD as Consulting Physician (Oncology) Jordan Bookbinder, MD as Consulting Physician (General Surgery) Jordan Dom, MD as Consulting Physician (Obstetrics and Gynecology) PCP: No PCP Per Patient OTHER MD:  CHIEF COMPLAINT: Estrogen receptor positive breast cancer  CURRENT TREATMENT: Awaiting definitive surgery   BREAST CANCER HISTORY: [name] woke up the morning of 11/09/2015 with some pain in her left axilla. She examined herself and found a lump in her left breast. She saw her gynecologist the same day, and she confirms a lump. The patient then proceeded directly to mammography. I do not have that report. However it did show the mass. [name] was then scheduled for ultrasound-guided biopsy 11/21/2015. This showed ([place] [phone] at the [facility] [employer] [facility] [employer]) and invasive ductal carcinoma, grade 2 estrogen receptor positive, with moderate intensity (10-50%), progesterone receptor negative, and HER-2 equivocal by immunohistochemistry. Fish was obtained and was equivocal as well, with a signals ratio of 1.8 to, the number per cell being 5.7.  On 12/05/2015 the patient had a CT/ angiogram of the chest for evaluation of her left-sided chest pain. This showed no evidence of a clot. The left breast nodule measured 1.3 cm on the study. There was no enlarged axillary adenopathy. There was also no evidence of lung metastasis or blastic or destructive lytic bone lesions. The upper abdomen was unremarkable.  With that information the patient presents for further evaluation and treatment.  INTERVAL HISTORY: [name] was evaluated in the breast clinic 12/11/2015 accompanied by her mother [name]  REVIEW OF SYSTEMS: Aside from the  mass itself in the left breast pain, there were no specific symptoms leading to the original mammogram. [name] is having significant pain not only in the left breast but also the right. This is well controlled with tramadol, which is not constipating for her. She does have a history of irritable bowel. Sometimes she has abdominal pain related to that. She also has some chronic back pain. She feels anxious and depressed. She is sleeping poorly. The patient denies unusual headaches, visual changes, nausea, vomiting, stiff neck, dizziness, or gait imbalance. There has been no cough, phlegm production, or pleurisy, no chest pain or pressure, and no change in bladder habits. The patient denies fever, rash, bleeding, unexplained fatigue or unexplained weight loss. A detailed review of systems was otherwise entirely negative.    PAST MEDICAL HISTORY: Past Medical History  Diagnosis Date  . Endometriosis   . Depression   . Anxiety   . Hormone disorder   . Herniated disc, cervical   . Neck pain   . Migraine without aura   . Breast cancer ([place])     PAST SURGICAL HISTORY: Past Surgical History  Procedure Laterality Date  . Abdominal hysterectomy      Still has ovaries    FAMILY HISTORY Family History  Problem Relation Age of Onset  . Anesthesia problems Neg Hx   . Hypotension Neg Hx   . Malignant hyperthermia Neg Hx   . Pseudochol deficiency Neg Hx   . Sarcoidosis Mother   . Hypertension Father   . Cancer Maternal Grandfather   . Cancer Paternal Grandmother   . Bladder Cancer Paternal Grandmother   . Cancer Paternal Grandfather   . Breast cancer Paternal Aunt  The patient's parents are both living, in their early 72s. The patient has one brother and one sister. On the father's side 1 great grandmother was diagnosed with breast cancer in her 40s. The patient's father's mother was diagnosed with stomach cancer. One of the patient's father's sisters was diagnosed with breast cancer in her 36s.  On the maternal side patient's mother's father was diagnosed with throat cancer at age.   GYNECOLOGIC HISTORY:  No LMP recorded. Patient has had a hysterectomy.  menarche age 62, first live birth age 73. The patient is GX P1. She stopped having periods in 2008, when she underwent a simple hysterectomy without salpingo-oophorectomy. She has a history of endometriosis and was started on Provera in March of this year, with her next dose due next week. (However she has decided to forego further Provera treatments at least for now).   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 He is licensed in [place] but not in [place] the patient's son [name] is a Ship broker in music at [place]: Not in place   HEALTH MAINTENANCE: Social History  Substance Use Topics  . Smoking status: Never Smoker   . Smokeless tobacco: Never Used  . Alcohol Use: No     Colonoscopy:  PAP:  Bone density:  Lipid panel:  Allergies  Allergen Reactions  . Amoxicillin Shortness Of Breath and Itching    Has patient had a PCN reaction causing immediate rash, facial/tongue/throat swelling, SOB or lightheadedness with hypotension: Yes Has patient had a PCN reaction causing severe rash involving mucus membranes or skin necrosis: No Has patient had a PCN reaction that required hospitalization Yes Has patient had a PCN reaction occurring within the last 10 years: No If all of the above answers are "NO", then may proceed with Cephalosporin use.     Current Outpatient Prescriptions  Medication Sig Dispense Refill  . diphenhydramine-acetaminophen (TYLENOL PM) 25-500 MG TABS tablet Take 2 tablets by mouth at bedtime as needed (sleep and pain).    . medroxyPROGESTERone (DEPO-PROVERA) 150 MG/ML injection Inject 150 mg into the muscle every 3 (three)  months.    . tamoxifen (NOLVADEX) 20 MG tablet Take 1 tablet (20 mg total) by mouth daily. 90 tablet 12  . traMADol (ULTRAM) 50 MG tablet Take 1 tablet (50 mg total) by mouth every 6 (six) hours as needed for moderate pain. 60 tablet 1  . zolpidem (AMBIEN) 10 MG tablet Take 1/2 to 1 tab po qhs prn 30 tablet 0   No current facility-administered medications for this visit.    OBJECTIVE: Early middle-aged African-American woman appears well  Filed Vitals:   12/11/15 1636  BP: 136/100  Pulse: 82  Temp: 98.9 F (37.2 C)  Resp: 18     Body mass index is 31.43 kg/(m^2).    ECOG FS:1 - Symptomatic but completely ambulatory  Ocular: Sclerae unicteric, pupils equal, round and reactive to light Ear-nose-throat: Oropharynx clear and moist Lymphatic: No cervical or supraclavicular adenopathy Lungs no rales or rhonchi, good excursion bilaterally Heart regular rate and rhythm, no murmur appreciated Abd soft, nontender, positive bowel sounds MSK no focal spinal tenderness, no joint edema Neuro: non-focal, well-oriented, appropriate affect Breasts: Both breasts are tender but the left breast is tender to minimal palpation. There is no erythema. I do not palpate any  axillary adenopathy.   LAB RESULTS:  CMP     Component Value Date/Time   NA 138 12/04/2015 1858   K 3.9 12/04/2015 1858   CL 107 12/04/2015 1858   CO2 25 12/04/2015 1858   GLUCOSE 89 12/04/2015 1858   BUN 13 12/04/2015 1858   CREATININE 0.78 12/04/2015 1858   CALCIUM 9.7 12/04/2015 1858   PROT 7.6 05/17/2015 1215   ALBUMIN 4.5 05/17/2015 1215   AST 20 05/17/2015 1215   ALT 20 05/17/2015 1215   ALKPHOS 51 05/17/2015 1215   BILITOT 0.6 05/17/2015 1215   GFRNONAA >60 12/04/2015 1858   GFRAA >60 12/04/2015 1858    INo results found for: SPEP, UPEP  Lab Results  Component Value Date   WBC 9.6 12/04/2015   NEUTROABS 4.9 05/17/2015   HGB 12.9 12/04/2015   HCT 38.5 12/04/2015   MCV 85.0 12/04/2015   PLT 323 12/04/2015       Chemistry      Component Value Date/Time   NA 138 12/04/2015 1858   K 3.9 12/04/2015 1858   CL 107 12/04/2015 1858   CO2 25 12/04/2015 1858   BUN 13 12/04/2015 1858   CREATININE 0.78 12/04/2015 1858      Component Value Date/Time   CALCIUM 9.7 12/04/2015 1858   ALKPHOS 51 05/17/2015 1215   AST 20 05/17/2015 1215   ALT 20 05/17/2015 1215   BILITOT 0.6 05/17/2015 1215       No results found for: LABCA2  No components found for: LABCA125  No results for input(s): INR in the last 168 hours.  Urinalysis    Component Value Date/Time   COLORURINE STRAW* 12/11/2008 1923   APPEARANCEUR CLEAR 12/11/2008 1923   LABSPEC 1.020 12/11/2008 1923   PHURINE 6.0 12/11/2008 1923   GLUCOSEU NEGATIVE 12/11/2008 1923   HGBUR NEGATIVE 12/11/2008 1923   BILIRUBINUR NEGATIVE 12/11/2008 1923   KETONESUR NEGATIVE 12/11/2008 1923   PROTEINUR NEGATIVE 12/11/2008 1923   UROBILINOGEN 0.2 12/11/2008 1923   NITRITE NEGATIVE 12/11/2008 1923   LEUKOCYTESUR  12/11/2008 1923    NEGATIVE MICROSCOPIC NOT DONE ON URINES WITH NEGATIVE PROTEIN, BLOOD, LEUKOCYTES, NITRITE, OR GLUCOSE <1000 mg/dL.      ELIGIBLE FOR AVAILABLE RESEARCH PROTOCOL:   STUDIES: Dg Chest 2 View  12/04/2015  CLINICAL DATA:  Acute onset of left-sided chest pain and cough. Initial encounter. EXAM: CHEST  2 VIEW COMPARISON:  Thoracic spine radiographs performed 04/29/2014 FINDINGS: The lungs are well-aerated and clear. There is no evidence of focal opacification, pleural effusion or pneumothorax. The heart is normal in size; the mediastinal contour is within normal limits. No acute osseous abnormalities are seen. IMPRESSION: No acute cardiopulmonary process seen. Electronically Signed   By: Garald Balding M.D.   On: 12/04/2015 19:53   Ct Angio Chest Pe W/cm &/or Wo Cm  12/05/2015  CLINICAL DATA:  Left-sided chest pain. Cough. Symptoms for 3 days. Recent diagnosis of left breast cancer. EXAM: CT ANGIOGRAPHY CHEST WITH CONTRAST  TECHNIQUE: Multidetector CT imaging of the chest was performed using the standard protocol during bolus administration of intravenous contrast. Multiplanar CT image reconstructions and MIPs were obtained to evaluate the vascular anatomy. CONTRAST:  100 cc Isovue 370 IV COMPARISON:  Chest radiograph 1 day prior. FINDINGS: There are no filling defects within the pulmonary arteries to suggest pulmonary embolus to the level of the segmental branches. The subsegmental branches cannot be assessed due to contrast bolus timing. Normal caliber thoracic aorta without evidence of dissection. There is  a prominent right hilar lymph node measuring 12 mm. Subcarinal lymph node measures 10 mm. No left hilar adenopathy. No pericardial effusion. Heterogeneous attenuation of the lower lobes may be due to air trapping or atelectasis. No confluent consolidation. Linear atelectasis in the right upper lobe. No pulmonary nodule or mass. No pleural effusion. No enlarged axillary lymph nodes. Nodule in the left breast measures 13 mm. No acute abnormality in the upper abdomen. There are no acute or suspicious osseous abnormalities. No blastic or destructive lytic lesions. Review of the MIP images confirms the above findings. IMPRESSION: 1. No pulmonary embolus. 2. Prominent right hilar lymph node measures 12 mm, nonspecific. 3. Heterogeneous attenuation of the lower lobes may be due to air trapping or atelectasis. Electronically Signed   By: Jeb Levering M.D.   On: 12/05/2015 02:43    ASSESSMENT: 40 y.o. [place] woman status post left breast upper outer quadrant biopsy 11/20/2015 for a clinical T1c N0, stage IA invasive ductal carcinoma, grade 2, estrogen receptor moderately positive, progesterone receptor negative, HER-2 equivocal by both immunohistochemistry and FISH  (1) neoadjuvant tamoxifen started 12/11/2015  (2) genetics testing scheduled for 12/12/2015  (3) definitive surgery to follow  (4) adjuvant chemotherapy to  follow surgery  (5) adjuvant radiation to follow chemotherapy  (6) anti-estrogens to be continued 5-10 years, more likely the latter    PLAN: We spent the better part of today's hour-long appointment discussing the biology of breast cancer in general, and the specifics of the patient's tumor in particular. Korah understands the data I am giving her comes partly from a CT scan and she is going to have more accurate measurements of her tumor and examination of her axilla when she has her breast mammography and MRI which is scheduled for tomorrow.  It does appear though that she has stage I breast cancer, and we discussed her situation with that in mind.  We outlined the treatment options for breast cancer, and in terms of systemic therapy she is a good candidate for anti-estrogen therapy. She is also a good candidate for chemotherapy. Given her age, the fact that she is progesterone receptor negative and estrogen receptor only moderately positive, I would not send an Oncotype or Mammaprint in her case but do recommend chemotherapy. As far as HER-2 is concerned we will have to wait until her definitive surgery since both her immunohistochemistry and FISH probes came back equivocal.  In any case she will need a port.  [name] has a significant risk of carrying a deleterious mutation. We discussed the fact that while bilateral mastectomies are commonly chosen in that situation, we can also intensify screening with yearly MRI of the breast in addition to yearly mammography and that option is equally safe. However [name] is very clear in her mind that if she carries a deleterious mutation she will want bilateral mastectomies I think that is a very reasonable decision in her situation..   We discussed the fact that lumpectomy plus radiation is as safe as mastectomy in terms of survival. [name] is also clear that if she does not carry a deleterious mutation she would opt for lumpectomy and radiation.  Given  the fact that the results from her genetics testing, though we will rush them, may be delayed, and that if she does carry a deleterious mutation she will have to meet with plastic surgery and make decisions regarding what kind of reconstruction to undergo, a delay of 3-6 weeks before definitive surgery may be expected. She is very  uncomfortable with this waiting. We discussed starting tamoxifen now. This would give her all the time she needs to make her decisions without feeling hurried or pressed.  Accordingly we discussed the possible toxicities, side effects and complications of tamoxifen. I went ahead and wrote her that prescription. I also refilled her tramadol. I have tentatively made her an appointment with me early July, but that can be postponed. There are no other complications or questions until after we have the results of her final surgery and the repeat prognostic panel.    Stacyd understanding of the overall plan. She agrees with it. She knows the goal of treatment in her case is cure. She will call with any problems that may develop before her next visit here.  Jordan Cruel, MD   12/11/2015 5:52 PM Medical Oncology and Hematology [facility] [address] [place], [place] [address] Tel. [phone]    Fax. [phone]

## 2015-12-12 ENCOUNTER — Other Ambulatory Visit: Payer: Self-pay | Admitting: General Surgery

## 2015-12-12 ENCOUNTER — Telehealth: Payer: Self-pay | Admitting: Oncology

## 2015-12-12 ENCOUNTER — Ambulatory Visit (HOSPITAL_BASED_OUTPATIENT_CLINIC_OR_DEPARTMENT_OTHER): Payer: BLUE CROSS/BLUE SHIELD | Admitting: Genetic Counselor

## 2015-12-12 ENCOUNTER — Encounter: Payer: Self-pay | Admitting: Genetic Counselor

## 2015-12-12 ENCOUNTER — Other Ambulatory Visit (HOSPITAL_BASED_OUTPATIENT_CLINIC_OR_DEPARTMENT_OTHER): Payer: BLUE CROSS/BLUE SHIELD

## 2015-12-12 DIAGNOSIS — Z315 Encounter for genetic counseling: Secondary | ICD-10-CM | POA: Diagnosis not present

## 2015-12-12 DIAGNOSIS — C50412 Malignant neoplasm of upper-outer quadrant of left female breast: Secondary | ICD-10-CM | POA: Diagnosis not present

## 2015-12-12 DIAGNOSIS — Z803 Family history of malignant neoplasm of breast: Secondary | ICD-10-CM

## 2015-12-12 DIAGNOSIS — C50919 Malignant neoplasm of unspecified site of unspecified female breast: Secondary | ICD-10-CM

## 2015-12-12 LAB — COMPREHENSIVE METABOLIC PANEL
ALT: 26 U/L (ref 0–55)
AST: 20 U/L (ref 5–34)
Albumin: 4.1 g/dL (ref 3.5–5.0)
Alkaline Phosphatase: 54 U/L (ref 40–150)
Anion Gap: 7 mEq/L (ref 3–11)
BUN: 12.4 mg/dL (ref 7.0–26.0)
CO2: 26 mEq/L (ref 22–29)
Calcium: 9.9 mg/dL (ref 8.4–10.4)
Chloride: 106 mEq/L (ref 98–109)
Creatinine: 0.9 mg/dL (ref 0.6–1.1)
EGFR: >90 mL/min/{1.73_m2} (ref >90)
Glucose: 84 mg/dl (ref 70–140)
Potassium: 4.6 mEq/L (ref 3.5–5.1)
Sodium: 139 mEq/L (ref 136–145)
Total Bilirubin: 0.4 mg/dL (ref 0.20–1.20)
Total Protein: 8 g/dL (ref 6.4–8.3)

## 2015-12-12 LAB — CBC WITH DIFFERENTIAL/PLATELET
BASO%: 0.3 % (ref 0.0–2.0)
Basophils Absolute: 0 10*3/uL (ref 0.0–0.1)
EOS%: 2.4 % (ref 0.0–7.0)
Eosinophils Absolute: 0.2 10*3/uL (ref 0.0–0.5)
HCT: 38.2 % (ref 34.8–46.6)
HGB: 13.2 g/dL (ref 11.6–15.9)
LYMPH%: 31.6 % (ref 14.0–49.7)
MCH: 29.7 pg (ref 25.1–34.0)
MCHC: 34.6 g/dL (ref 31.5–36.0)
MCV: 85.8 fL (ref 79.5–101.0)
MONO#: 0.6 10*3/uL (ref 0.1–0.9)
MONO%: 8.1 % (ref 0.0–14.0)
NEUT#: 4.5 10*3/uL (ref 1.5–6.5)
NEUT%: 57.6 % (ref 38.4–76.8)
Platelets: 287 10*3/uL (ref 145–400)
RBC: 4.45 10*6/uL (ref 3.70–5.45)
RDW: 12.9 % (ref 11.2–14.5)
WBC: 7.8 10*3/uL (ref 3.9–10.3)
lymph#: 2.5 10*3/uL (ref 0.9–3.3)

## 2015-12-12 NOTE — Progress Notes (Signed)
REFERRING PROVIDER: [name], MD [place], [place] [address]   Lurline Del, MD  PRIMARY PROVIDER:  No PCP Per Patient  PRIMARY REASON FOR VISIT:  1. Breast cancer of upper-outer quadrant of left female breast ([place])   2. Family history of breast cancer      HISTORY OF PRESENT ILLNESS:   [name], a 40 y.o. female, was seen for a [place] cancer genetics consultation at the request of Dr. Donne Hazel due to a personal and family history of cancer.  [name] presents to clinic today to discuss the possibility of a hereditary predisposition to cancer, genetic testing, and to further clarify her future cancer risks, as well as potential cancer risks for family members.   In 2017, at the age of 76, [name] was diagnosed with invasive ductal carcinoma of the left breast. The tumor is ER weakly +, PR-, Her2 equivocal. This will be treated with chemotherapy, surgery and radiation.       CANCER HISTORY:   No history exists.     HORMONAL RISK FACTORS:  Menarche was at age 62.  First live birth at age 3.  OCP use for approximately 10 years.  Ovaries intact: yes.  Hysterectomy: yes.  Menopausal status: premenopausal.  HRT use: 0 years. Colonoscopy: no; not examined. Mammogram within the last year: yes. Number of breast biopsies: 1. Up to date with pelvic exams:  yes. Any excessive radiation exposure in the past:  no  Past Medical History  Diagnosis Date  . Endometriosis   . Depression   . Anxiety   . Hormone disorder   . Herniated disc, cervical   . Neck pain   . Migraine without aura   . Breast cancer ([place])   . Family history of breast cancer     Past Surgical History  Procedure Laterality Date  . Abdominal hysterectomy      Still has ovaries    Social History   Social History  . Marital Status: Married    Spouse Name: N/A  . Number of Children: 1  . Years of Education: N/A   Social History Main Topics  . Smoking  status: Never Smoker   . Smokeless tobacco: Never Used  . Alcohol Use: No  . Drug Use: No  . Sexual Activity:    Partners: Male   Other Topics Concern  . None   Social History Narrative     FAMILY HISTORY:  We obtained a detailed, 4-generation family history.  Significant diagnoses are listed below: Family History  Problem Relation Age of Onset  . Anesthesia problems Neg Hx   . Hypotension Neg Hx   . Malignant hyperthermia Neg Hx   . Pseudochol deficiency Neg Hx   . Sarcoidosis Mother   . Hypertension Father   . Throat cancer Maternal Grandfather     smoker and heavy drinker; dx in his late 33s-50s  . Cancer Paternal Grandmother     possible gatric vs bladder cancer  . Bladder Cancer Paternal Grandmother   . Breast cancer Paternal Aunt     dxin her 89s; dad's maternal half sister  . Breast cancer Other     PGFs mother    The patient has one son who is healthy and a sister and brother who are healthy and cacner free.  Her parents are both living.  Her mother is 59.  She has a sister and a maternal half sister who are healthy and cancer free.  The patient's maternal grandmother is alive at 3 and her grandfather died of throat cancer.  The patient's father is healthy at 90.  He has two maternal half brothers and two maternal half sisters.  One sister was diagnosed with breast cancer in her 55s and is currently 25.  The patient's paternal grandmother had either bladder or gastric cancer in her 77s, and her grandfather had a mother who died of breast cancer in her 65s.Patient's maternal ancestors are of [place] and [place] descent, and paternal ancestors are of African American descent. There is no reported Ashkenazi Jewish ancestry. There is no known consanguinity.  GENETIC COUNSELING ASSESSMENT: ARIEANA SOMOZA is a 40 y.o. female with a personal and family history of breast cancer which is somewhat suggestive of a hereditary cancer syndrome and predisposition to  cancer. We, therefore, discussed and recommended the following at today's visit.   DISCUSSION: We discussed that about 5-10% of breast cancer is hereditary, with most cases due to BRCA mutations.  OTher hereditary genes we see that can increase the risk for breast cancer include PALB2, CHEK2 and ATM.  We discussed that her paternal half aunt should consider genetic testing, regardless of what Ms. Campbell's results are.  We reviewed the characteristics, features and inheritance patterns of hereditary cancer syndromes. We also discussed genetic testing, including the appropriate family members to test, the process of testing, insurance coverage and turn-around-time for results. We discussed the implications of a negative, positive and/or variant of uncertain significant result. We recommended [name] pursue genetic testing for the Breast/Ovarian cancer gene panel. The Breast/Ovarian gene panel offered by [facility] includes sequencing and rearrangement analysis for the following 20 genes:  ATM, BARD1, BRCA1, BRCA2, BRIP1, CDH1, CHEK2, EPCAM, FANCC, MLH1, MSH2, MSH6, NBN, PALB2, PMS2, PTEN, RAD51C, RAD51D, TP53, and XRCC2.     Based on Ms. Campbell's personal and family history of cancer, she meets medical criteria for genetic testing. Despite that she meets criteria, she may still have an out of pocket cost. We discussed that if her out of pocket cost for testing is over $100, the laboratory will call and confirm whether she wants to proceed with testing.  If the out of pocket cost of testing is less than $100 she will be billed by the genetic testing laboratory.   PLAN: After considering the risks, benefits, and limitations, [name]  provided informed consent to pursue genetic testing and the blood sample was sent to [facility] for analysis of the Breast/Ovarian cancer panel. Results should be available within approximately 2-3 weeks' time, at which point they will be disclosed by telephone to  [name], as will any additional recommendations warranted by these results. [name] will receive a summary of her genetic counseling visit and a copy of her results once available. This information will also be available in Epic. We encouraged [name] to remain in contact with cancer genetics annually so that we can continuously update the family history and inform her of any changes in cancer genetics and testing that may be of benefit for her family. Ms. Hale Bogus questions were answered to her satisfaction today. Our contact information was provided should additional questions or concerns arise.  Lastly, we encouraged [name] to remain in contact with cancer genetics annually so that we can continuously update the family history and inform her of any changes in cancer genetics and testing that may be of benefit for this family.   Ms.  Hale Bogus questions were answered  to her satisfaction today. Our contact information was provided should additional questions or concerns arise. Thank you for the referral and allowing [place] to share in the care of your patient.   Karen P. Florene Glen, [place], [facility] Certified Genetic Counselor [email or web address] phone: [phone]  The patient was seen for a total of 35 minutes in face-to-face genetic counseling.  This patient was discussed with Drs. Magrinat, Lindi Adie and/or Burr Medico who agrees with the above.    _______________________________________________________________________ For Office Staff:  Number of people involved in session: 2 Was an Intern/ student involved with case: yes: Lynann Beaver

## 2015-12-12 NOTE — Telephone Encounter (Signed)
Spoke with patient to confirm July 7 appt at 11 am per GM 6/6 pof

## 2015-12-13 ENCOUNTER — Telehealth: Payer: Self-pay | Admitting: *Deleted

## 2015-12-13 ENCOUNTER — Ambulatory Visit: Payer: BLUE CROSS/BLUE SHIELD | Admitting: Obstetrics and Gynecology

## 2015-12-13 NOTE — Telephone Encounter (Signed)
Called pt to give introduction, navigation resources and contact information. Confirmed future appts. Denies needs or questions at this time. Discussed care plan summary.

## 2015-12-16 ENCOUNTER — Ambulatory Visit
Admission: RE | Admit: 2015-12-16 | Discharge: 2015-12-16 | Disposition: A | Discharge status: Home / Self Care | Payer: BLUE CROSS/BLUE SHIELD | Source: Ambulatory Visit | Attending: General Surgery | Admitting: General Surgery

## 2015-12-16 DIAGNOSIS — C50412 Malignant neoplasm of upper-outer quadrant of left female breast: Secondary | ICD-10-CM

## 2015-12-16 MED ORDER — GADOBENATE DIMEGLUMINE 529 MG/ML IV SOLN
17.0000 mL | Freq: Once | INTRAVENOUS | Status: AC | PRN
  Administered 2015-12-16: 17 mL via INTRAVENOUS

## 2015-12-18 ENCOUNTER — Encounter: Payer: Self-pay | Admitting: Radiation Oncology

## 2015-12-18 ENCOUNTER — Other Ambulatory Visit: Payer: Self-pay | Admitting: General Surgery

## 2015-12-18 NOTE — Progress Notes (Addendum)
Location of Breast Cancer:  Left Breast 2 o'clock position Upper Outer Quadrant  Histology per Pathology Report: 11/20/15 Left breast  Bx: Invasive Ductal Carcinoma dx in [place]  Receptor Status: ER(+10-50%), PR (neg), Her2-neu (    )  Did patient present with symptoms (if so, please note symptoms) or was this found on screening mammography?: patient felt left breast mass   Past/Anticipated interventions by surgeon, if any: scheduled to see Dr. Donne Hazel Friday 12/21/15   Past/Anticipated interventions by medical oncology, if any: Chemotherapy : neoadjuvant tamoxifen started 12/11/15,genetic testing scheduled 12/12/15, MRi scheduled , folow up July 2017  Lymphedema issues, if any:  No  Pain issues, if any: 4/10 left breast and under axilla  SAFETY ISSUES: No  Prior radiation? NO  Pacemaker/ICD? NO  Possible current pregnancy? No  Is the patient on methotrexate?  NO  Current Complaints / other details: Married,   Menarche age 48,G1P1, First live birth age 42; Hx  Depo-provera contraceptive, on hold now,   , former smoker, occasional alcohol use,no illicit drug use, Anxiety,depression,partial hysterectomy not due to cancer Maternal grandfather throat cancer  cancer, Paternal grandmother stomach cancer, ,,bladder cancer,  Paternal Aunt breast cancer dx 30's., , Mother Sarcoidosis,Paternal great grandmother breast cancer dx in her 49's;   Allergies:PCNS= Rash,facial Alphia Moh swelling,sob,w/hypotension; -Amoxicillin=SOB,itching     Rebecca Eaton, RN 12/18/2015,9:15 AM BP 118/90 mmHg  Pulse 85  Temp(Src) 98.2 F (36.8 C) (Oral)  Resp 16  Ht '5\' 4"'  (1.626 m)  Wt 182 lb 1.6 oz (82.6 kg)  BMI 31.24 kg/m2  Wt Readings from Last 3 Encounters:  12/19/15 182 lb 1.6 oz (82.6 kg)  12/11/15 180 lb 4.8 oz (81.784 kg)  12/05/15 184 lb (83.462 kg)

## 2015-12-19 ENCOUNTER — Ambulatory Visit
Admission: RE | Admit: 2015-12-19 | Discharge: 2015-12-19 | Disposition: A | Discharge status: Home / Self Care | Payer: BLUE CROSS/BLUE SHIELD | Source: Ambulatory Visit | Attending: Radiation Oncology | Admitting: Radiation Oncology

## 2015-12-19 ENCOUNTER — Encounter: Payer: Self-pay | Admitting: Radiation Oncology

## 2015-12-19 VITALS — BP 118/90 | HR 85 | Temp 98.2°F | Resp 16 | Ht 64.0 in | Wt 182.1 lb

## 2015-12-19 DIAGNOSIS — Z170 Estrogen receptor positive status [ER+]: Secondary | ICD-10-CM | POA: Diagnosis not present

## 2015-12-19 DIAGNOSIS — N809 Endometriosis, unspecified: Secondary | ICD-10-CM | POA: Diagnosis not present

## 2015-12-19 DIAGNOSIS — F329 Major depressive disorder, single episode, unspecified: Secondary | ICD-10-CM | POA: Insufficient documentation

## 2015-12-19 DIAGNOSIS — C50412 Malignant neoplasm of upper-outer quadrant of left female breast: Secondary | ICD-10-CM | POA: Diagnosis present

## 2015-12-19 DIAGNOSIS — M5020 Other cervical disc displacement, unspecified cervical region: Secondary | ICD-10-CM | POA: Diagnosis not present

## 2015-12-19 DIAGNOSIS — G43909 Migraine, unspecified, not intractable, without status migrainosus: Secondary | ICD-10-CM | POA: Insufficient documentation

## 2015-12-19 DIAGNOSIS — F419 Anxiety disorder, unspecified: Secondary | ICD-10-CM | POA: Insufficient documentation

## 2015-12-19 DIAGNOSIS — Z803 Family history of malignant neoplasm of breast: Secondary | ICD-10-CM | POA: Diagnosis not present

## 2015-12-19 HISTORY — DX: Allergy, unspecified, initial encounter: T78.40XA

## 2015-12-19 NOTE — Progress Notes (Signed)
Please see the Nurse Progress Note in the MD Initial Consult Encounter for this patient. 

## 2015-12-19 NOTE — Progress Notes (Signed)
Radiation Oncology         [phone] ________________________________  Name: [name] MRN: [id number]  Date: 12/19/2015  DOB: 1976/04/02  CC:No PCP Per Patient  [name], MD     REFERRING PHYSICIAN: [name], MD   DIAGNOSIS: The encounter diagnosis was Breast cancer of upper-outer quadrant of left female breast ([place]).   Clinical T2N0, stage IIA invasive ductal carcinoma of the left breast  HISTORY OF PRESENT ILLNESS: [name] is a 40 y.o. female woke up the morning of 11/09/2015 with left breast pain and a palpable mass. She  proceeded to a diagnositc bilateral mammogram and ultrasound of the left breast the same day. Mammogram noted a 1.7 cm high density mass in the upper outer quadrant of the left breast corresponding the area of palpable concern and ultrasound noted an irregular mass spanning 1.4 x 1.7 x 1.9 cm corresponding to the area of palpable concern. Biopsy of this mass on 11/20/15 revealed grade 2 invasive ductal carcinoma (ER positive (10-50%), PR negative, HER2 equivocal).  The patient relocated from [place], [place] to [place] after her diagnosis and that she would be closer to family for her care. She was evaluated on 12/05/2015, due to left-sided chest pain, and a CT/angiogram did not reveal evidence of a pulmonary embolus. The left breast nodule measured 1.3 cm on the study. There was no enlarged axillary adenopathy. There was also no evidence of lung metastasis, blastic, or destructive lytic bone lesions. The upper abdomen was unremarkable. Of note, a right hilar lymph node measuring 1.2 cm and a subcarinal lymph node measuring 1 cm were found.  The patient presented to Dr. Jana Hakim on 12/11/15, who has started the patient on neoadjuvant Tamoxifen the same day and scheduled the patient for genetics counseling on 12/12/15 to determine if the patient has a deleterious mutation. Genetics testing has been sent off last week. An MRI of the  bilateral breasts on 12/18/15 noted an irregular enhancing mass (corresponding to the known biopsy-proven malignancy) in the UOQ of the left breast measuring 2.5 x 2.2 x 1.8 cm. Non-mass enhancement extends 1.5 cm anteriorly and 2.0 cm posteriorly. The combination of the non-mass enhancement and the mass spans 5.3 cm in the AP dimension.  The patient presents today to discuss the role of radiation in the management of her disease. She is planning to meet with Dr. Donne Hazel on Friday, and is hopeful to discuss the role of bilateral mastectomies.   PREVIOUS RADIATION THERAPY: No   PAST MEDICAL HISTORY:  Past Medical History  Diagnosis Date  . Endometriosis   . Depression   . Anxiety   . Hormone disorder   . Herniated disc, cervical   . Neck pain   . Migraine without aura   . Breast cancer ([place]) 11/20/15    left breast  . Family history of breast cancer   . Allergy   . IBD (inflammatory bowel disease)        PAST SURGICAL HISTORY: Past Surgical History  Procedure Laterality Date  . Abdominal hysterectomy      Still has ovaries     FAMILY HISTORY:  Family History  Problem Relation Age of Onset  . Anesthesia problems Neg Hx   . Hypotension Neg Hx   . Malignant hyperthermia Neg Hx   . Pseudochol deficiency Neg Hx   . Sarcoidosis Mother   . Hypertension Father 1  . Throat cancer Maternal Grandfather     smoker and heavy drinker; dx  in his late 42s-50s  . Cancer Paternal Grandmother     possible gatric vs bladder cancer  . Bladder Cancer Paternal Grandmother   . Breast cancer Paternal Aunt     dxin her 56s; dad's maternal half sister  . Breast cancer Other     PGFs mother     SOCIAL HISTORY:  reports that she has never smoked. She has never used smokeless tobacco. She reports that she does not drink alcohol or use illicit drugs. The patient is married. 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 Her  husband recently finished possible and is going to be relocating at the end of the months to [place]. Her son is a Paramedic at [employer].   ALLERGIES: Amoxicillin   MEDICATIONS:  Current Outpatient Prescriptions  Medication Sig Dispense Refill  . tamoxifen (NOLVADEX) 20 MG tablet Take 1 tablet (20 mg total) by mouth daily. 90 tablet 12  . traMADol (ULTRAM) 50 MG tablet Take 1 tablet (50 mg total) by mouth every 6 (six) hours as needed for moderate pain. 60 tablet 1  . zolpidem (AMBIEN) 10 MG tablet Take 1/2 to 1 tab po qhs prn 30 tablet 0  . diphenhydramine-acetaminophen (TYLENOL PM) 25-500 MG TABS tablet Take 2 tablets by mouth at bedtime as needed (sleep and pain). Reported on 12/19/2015     No current facility-administered medications for this encounter.     REVIEW OF SYSTEMS:  On review of systems, the patient reports that she is doing well overall. The patient reports soreness of the right breast that might be related to her Depo Provera and she does have discomfort to palpation of the left breast. She denies, shortness of breath, cough, fevers, chills, night sweats, unintended weight changes. She denies any bowel or bladder disturbances, and denies abdominal pain, nausea or vomiting. She denies any new musculoskeletal or joint aches or pains. A complete review of systems is obtained and is otherwise negative.  PHYSICAL EXAM:  height is '5\' 4"'$  (1.626 m) and weight is 182 lb 1.6 oz (82.6 kg). Her oral temperature is 98.2 F (36.8 C). Her blood pressure is 118/90 and her pulse is 85. Her respiration is 16.   Pain scale 4/10 In general this is a well appearing African-American female in no acute distress. She is alert and oriented x4 and appropriate throughout the examination. HEENT reveals that the patient is normocephalic, atraumatic. EOMs are intact. PERRLA. Skin is intact without any evidence of gross lesions. Cardiovascular exam reveals a regular rate  and rhythm, no clicks rubs or murmurs are auscultated. Chest is clear to auscultation bilaterally. Palpable area of fullness in the UOQ of the left breast that is uncomfortable to palpation. No palpable mass in th right breast. Bilateral nipples are negative for bleeding or drainage. Lymphatic assessment is performed and does not reveal any adenopathy in the cervical, supraclavicular, axillary, or inguinal chains. Abdomen has active bowel sounds in all quadrants and is intact. The abdomen is soft, non tender, non distended. Lower extremities are negative for pretibial pitting edema, deep calf tenderness, cyanosis or clubbing.  ECOG = 1  0 - Asymptomatic (Fully active, able to carry on all predisease activities without restriction)  1 - Symptomatic but completely ambulatory (Restricted in physically strenuous activity but ambulatory and able to carry out work of a light or sedentary nature. For example, light housework, office work)  2 -  Symptomatic, <50% in bed during the day (Ambulatory and capable of all self care but unable to carry out any work activities. Up and about more than 50% of waking hours)  3 - Symptomatic, >50% in bed, but not bedbound (Capable of only limited self-care, confined to bed or chair 50% or more of waking hours)  4 - Bedbound (Completely disabled. Cannot carry on any self-care. Totally confined to bed or chair)  5 - Death   Eustace Pen MM, Creech RH, Tormey DC, et al. [phone]). "Toxicity and response criteria of the [facility]". [place] Oncol. 5 (6): 649-55    LABORATORY DATA:  Lab Results  Component Value Date   WBC 7.8 12/12/2015   HGB 13.2 12/12/2015   HCT 38.2 12/12/2015   MCV 85.8 12/12/2015   PLT 287 12/12/2015   Lab Results  Component Value Date   NA 139 12/12/2015   K 4.6 12/12/2015   CL 107 12/04/2015   CO2 26 12/12/2015   Lab Results  Component Value Date   ALT 26 12/12/2015   AST 20 12/12/2015   ALKPHOS 54 12/12/2015    BILITOT 0.40 12/12/2015      RADIOGRAPHY: Dg Chest 2 View  12/04/2015  CLINICAL DATA:  Acute onset of left-sided chest pain and cough. Initial encounter. EXAM: CHEST  2 VIEW COMPARISON:  Thoracic spine radiographs performed 04/29/2014 FINDINGS: The lungs are well-aerated and clear. There is no evidence of focal opacification, pleural effusion or pneumothorax. The heart is normal in size; the mediastinal contour is within normal limits. No acute osseous abnormalities are seen. IMPRESSION: No acute cardiopulmonary process seen. Electronically Signed   By: Garald Balding M.D.   On: 12/04/2015 19:53   Ct Angio Chest Pe W/cm &/or Wo Cm  12/05/2015  CLINICAL DATA:  Left-sided chest pain. Cough. Symptoms for 3 days. Recent diagnosis of left breast cancer. EXAM: CT ANGIOGRAPHY CHEST WITH CONTRAST TECHNIQUE: Multidetector CT imaging of the chest was performed using the standard protocol during bolus administration of intravenous contrast. Multiplanar CT image reconstructions and MIPs were obtained to evaluate the vascular anatomy. CONTRAST:  100 cc Isovue 370 IV COMPARISON:  Chest radiograph 1 day prior. FINDINGS: There are no filling defects within the pulmonary arteries to suggest pulmonary embolus to the level of the segmental branches. The subsegmental branches cannot be assessed due to contrast bolus timing. Normal caliber thoracic aorta without evidence of dissection. There is a prominent right hilar lymph node measuring 12 mm. Subcarinal lymph node measures 10 mm. No left hilar adenopathy. No pericardial effusion. Heterogeneous attenuation of the lower lobes may be due to air trapping or atelectasis. No confluent consolidation. Linear atelectasis in the right upper lobe. No pulmonary nodule or mass. No pleural effusion. No enlarged axillary lymph nodes. Nodule in the left breast measures 13 mm. No acute abnormality in the upper abdomen. There are no acute or suspicious osseous abnormalities. No blastic or  destructive lytic lesions. Review of the MIP images confirms the above findings. IMPRESSION: 1. No pulmonary embolus. 2. Prominent right hilar lymph node measures 12 mm, nonspecific. 3. Heterogeneous attenuation of the lower lobes may be due to air trapping or atelectasis. Electronically Signed   By: Jeb Levering M.D.   On: 12/05/2015 02:43   Mr Breast Bilateral W Wo Contrast  12/18/2015  CLINICAL DATA:  Recently diagnosed left breast grade 2 estrogen receptor positive invasive ductal carcinoma. The diagnosis was made at the [facility]. Patient is referred for  pre treatment breast MRI. LABS:  Not applicable EXAM: BILATERAL BREAST MRI WITH AND WITHOUT CONTRAST TECHNIQUE: Multiplanar, multisequence MR images of both breasts were obtained prior to and following the intravenous administration of 17 ml of MultiHance. THREE-DIMENSIONAL MR IMAGE RENDERING ON INDEPENDENT WORKSTATION: Three-dimensional MR images were rendered by post-processing of the original MR data on an independent workstation. The three-dimensional MR images were interpreted, and findings are reported in the following complete MRI report for this study. Three dimensional images were evaluated at the independent DynaCad workstation COMPARISON:  Mammograms from [place] dated 12/12/2015 as well as left breast ultrasound from [facility] imaging dated 12/12/2015. The mammograms and ultrasound from the [facility] are not available for comparison at this time. FINDINGS: Breast composition: b. Scattered fibroglandular tissue. Background parenchymal enhancement: Moderate. Right breast: No mass or abnormal enhancement. Left breast: There is an irregular enhancing mass located within the posterior 1/3 of the upper-outer quadrant of the left breast. The mass is associated with washout enhancement kinetics. There is a signal void centrally located within the mass related to the patient's recent image guided biopsy. The mass measures 2.5 x  2.2 x 1.8 cm in size. Non mass enhancement extends 1.5 cm anteriorly and also 2.0 cm posteriorly. The combination of the non mass enhancement and mass spans 5.3 cm in the AP dimension. There are no additional areas of worrisome enhancement within the left breast. Lymph nodes: No abnormal appearing lymph nodes. Ancillary findings:  None. IMPRESSION: 2.5 cm irregular enhancing mass located within the posterior 1/3 of the upper-outer quadrant of the left breast corresponding to the known biopsy-proven malignancy. Non mass enhancement also extends anteriorly and posteriorly from the mass and in aggregate the enhancement associated with the mass and the non mass enhancement span 5.3 cm in the AP dimension. RECOMMENDATION: Treatment plan. BI-RADS CATEGORY  6: Known biopsy-proven malignancy. Electronically Signed   By: Altamese Cabal M.D.   On: 12/18/2015 10:21       IMPRESSION: Clinical T2N0, stage IA versus IIA invasive ductal carcinoma of the left breast   PLAN: Dr. Jana Hakim has recommended chemotherapy given the patient's age and the fact that she is ER moderately positive and PR negative. He will not send an Oncotype or Mammaprint test in her case, but does recommend adjuvant chemotherapy. As far as HER-2 is concerned, that will be determined after biopsies from her definitive surgery since both her immunohistochemistry and FISH probes came back equivocal. The patient is at risk of carrying a deleterious mutation. The patient has expressed that if she carries a mutation, she would want bilateral mastectomies, but is also contemplating this regardless of her genetic status. We discussed the equivalence in terms of survival and local failure between mastectomy and breast conservation.   Dr. Lisbeth Renshaw spoke with the patient today regarding her diagnosis and options for treatment. We discussed the role of radiation in decreasing local failures in patients who undergo lumpectomy, as well as if patients who undergo  mastectomy have tumors greater than 5 cm or nodal involvement. We discussed the process of CT simulation and the placement tattoos. We discussed a typical radiation treatment would include 33 fractions over 6 1/2 weeks as an outpatient. We discussed the possibility of asymptomatic lung damage. We discussed the low likelihood of secondary malignancies. We discussed the possible side effects; including but not limited to skin redness, fatigue, permanent skin darkening, and breast swelling.  We discussed the use of cardiac sparing with deep inspiration breath hold if  needed. We will follow along with the patient once her surgery has occurred and base recommendations off of the results of her pathology. She states agreement and understanding and is given contact information if she has questions or concerns prior to that visit.    Carola Rhine, PAC  This document serves as a record of services personally performed by Shona [name], PA-C and Kyung Rudd, MD. It was created on their behalf by Darcus Austin, a trained medical scribe. The creation of this record is based on the scribe's personal observations and the providers' statements to them. This document has been checked and approved by the attending provider.

## 2015-12-20 ENCOUNTER — Encounter: Payer: Self-pay | Admitting: Radiation Oncology

## 2015-12-21 ENCOUNTER — Encounter (HOSPITAL_COMMUNITY): Payer: Self-pay | Admitting: *Deleted

## 2015-12-21 ENCOUNTER — Other Ambulatory Visit: Payer: Self-pay | Admitting: Oncology

## 2015-12-21 ENCOUNTER — Other Ambulatory Visit: Payer: Self-pay | Admitting: General Surgery

## 2015-12-21 ENCOUNTER — Telehealth: Payer: Self-pay | Admitting: Oncology

## 2015-12-21 ENCOUNTER — Telehealth (HOSPITAL_COMMUNITY): Payer: Self-pay | Admitting: Vascular Surgery

## 2015-12-21 ENCOUNTER — Other Ambulatory Visit: Payer: Self-pay | Admitting: *Deleted

## 2015-12-21 DIAGNOSIS — C50412 Malignant neoplasm of upper-outer quadrant of left female breast: Secondary | ICD-10-CM

## 2015-12-21 NOTE — Progress Notes (Unsigned)
[name]'s MRI shows more extensive disease than previously appreciated. It would be advisable to treat neoadjuvantly in an effort to optimize the surgical results. Her HER-2 treatments have been repeatedly equivocal and this means we will go ahead and treat with carboplatin, docetaxel, trastuzumab and pertuzumab. Beginning June 27. I will see her on June 23 to discuss this plan in more detail and also to give her her nausea and other supportive metastases. She will have an echocardiogram before that visit as well as support.

## 2015-12-21 NOTE — Telephone Encounter (Signed)
Left pt message to make NP APPT W/ echo 12/27/15

## 2015-12-21 NOTE — Telephone Encounter (Signed)
sch appt per 6/16 pof. Sent staff message to precert echo. Once obtained to to be notified

## 2015-12-24 ENCOUNTER — Other Ambulatory Visit: Payer: Self-pay | Admitting: *Deleted

## 2015-12-25 ENCOUNTER — Encounter (HOSPITAL_COMMUNITY)
Admission: RE | Disposition: A | Discharge status: Home / Self Care | Payer: Self-pay | Source: Ambulatory Visit | Attending: General Surgery

## 2015-12-25 ENCOUNTER — Ambulatory Visit (HOSPITAL_COMMUNITY): Payer: BLUE CROSS/BLUE SHIELD | Admitting: Certified Registered Nurse Anesthetist

## 2015-12-25 ENCOUNTER — Other Ambulatory Visit: Payer: BLUE CROSS/BLUE SHIELD

## 2015-12-25 ENCOUNTER — Encounter (HOSPITAL_COMMUNITY): Payer: Self-pay | Admitting: *Deleted

## 2015-12-25 ENCOUNTER — Ambulatory Visit (HOSPITAL_COMMUNITY)
Admission: RE | Admit: 2015-12-25 | Discharge: 2015-12-25 | Disposition: A | Discharge status: Home / Self Care | Payer: BLUE CROSS/BLUE SHIELD | Source: Ambulatory Visit | Attending: General Surgery | Admitting: General Surgery

## 2015-12-25 ENCOUNTER — Ambulatory Visit (HOSPITAL_COMMUNITY): Payer: BLUE CROSS/BLUE SHIELD

## 2015-12-25 DIAGNOSIS — Z170 Estrogen receptor positive status [ER+]: Secondary | ICD-10-CM | POA: Diagnosis not present

## 2015-12-25 DIAGNOSIS — Z90710 Acquired absence of both cervix and uterus: Secondary | ICD-10-CM | POA: Diagnosis not present

## 2015-12-25 DIAGNOSIS — Z79810 Long term (current) use of selective estrogen receptor modulators (SERMs): Secondary | ICD-10-CM | POA: Diagnosis not present

## 2015-12-25 DIAGNOSIS — Z803 Family history of malignant neoplasm of breast: Secondary | ICD-10-CM | POA: Diagnosis not present

## 2015-12-25 DIAGNOSIS — C50412 Malignant neoplasm of upper-outer quadrant of left female breast: Secondary | ICD-10-CM | POA: Insufficient documentation

## 2015-12-25 DIAGNOSIS — Z95828 Presence of other vascular implants and grafts: Secondary | ICD-10-CM

## 2015-12-25 HISTORY — DX: Nausea with vomiting, unspecified: R11.2

## 2015-12-25 HISTORY — DX: Nausea with vomiting, unspecified: Z98.890

## 2015-12-25 HISTORY — PX: PORTACATH PLACEMENT: SHX2246

## 2015-12-25 HISTORY — DX: Personal history of other diseases of the digestive system: Z87.19

## 2015-12-25 HISTORY — DX: Gastro-esophageal reflux disease without esophagitis: K21.9

## 2015-12-25 SURGERY — INSERTION, TUNNELED CENTRAL VENOUS DEVICE, WITH PORT
Anesthesia: General | Site: Chest | Laterality: Right

## 2015-12-25 MED ORDER — BUPIVACAINE-EPINEPHRINE (PF) 0.25% -1:200000 IJ SOLN
INTRAMUSCULAR | Status: DC | PRN
  Administered 2015-12-25: 10 mL

## 2015-12-25 MED ORDER — LIDOCAINE HCL (CARDIAC) 20 MG/ML IV SOLN
INTRAVENOUS | Status: AC
  Filled 2015-12-25: qty 5

## 2015-12-25 MED ORDER — SODIUM CHLORIDE 0.9 % IV SOLN: INTRAVENOUS | Status: DC

## 2015-12-25 MED ORDER — ACETAMINOPHEN 500 MG PO TABS
1000.0000 mg | ORAL_TABLET | ORAL | Status: AC
  Administered 2015-12-25: 1000 mg via ORAL
  Filled 2015-12-25: qty 2

## 2015-12-25 MED ORDER — SODIUM CHLORIDE 0.9% FLUSH: 3.0000 mL | INTRAVENOUS | Status: DC | PRN

## 2015-12-25 MED ORDER — MIDAZOLAM HCL 5 MG/5ML IJ SOLN
INTRAMUSCULAR | Status: DC | PRN
  Administered 2015-12-25: 2 mg via INTRAVENOUS

## 2015-12-25 MED ORDER — HEPARIN SOD (PORK) LOCK FLUSH 100 UNIT/ML IV SOLN
INTRAVENOUS | Status: DC | PRN
  Administered 2015-12-25: 500 [IU]

## 2015-12-25 MED ORDER — FENTANYL CITRATE (PF) 100 MCG/2ML IJ SOLN
INTRAMUSCULAR | Status: AC
  Filled 2015-12-25: qty 2

## 2015-12-25 MED ORDER — SODIUM CHLORIDE 0.9 % IV SOLN: 250.0000 mL | INTRAVENOUS | Status: DC | PRN

## 2015-12-25 MED ORDER — HYDROCODONE-ACETAMINOPHEN 10-325 MG PO TABS: 1.0000 | ORAL_TABLET | Freq: Four times a day (QID) | ORAL | Status: DC | PRN

## 2015-12-25 MED ORDER — CIPROFLOXACIN IN D5W 400 MG/200ML IV SOLN
INTRAVENOUS | Status: AC
  Filled 2015-12-25: qty 200

## 2015-12-25 MED ORDER — LACTATED RINGERS IV SOLN
INTRAVENOUS | Status: DC | PRN
  Administered 2015-12-25: 07:00:00 via INTRAVENOUS

## 2015-12-25 MED ORDER — BUPIVACAINE-EPINEPHRINE 0.25% -1:200000 IJ SOLN
INTRAMUSCULAR | Status: AC
  Filled 2015-12-25: qty 1

## 2015-12-25 MED ORDER — ONDANSETRON HCL 4 MG/2ML IJ SOLN
INTRAMUSCULAR | Status: AC
  Filled 2015-12-25: qty 2

## 2015-12-25 MED ORDER — HEPARIN SOD (PORK) LOCK FLUSH 100 UNIT/ML IV SOLN
INTRAVENOUS | Status: AC
  Filled 2015-12-25: qty 5

## 2015-12-25 MED ORDER — OXYCODONE HCL 5 MG/5ML PO SOLN
5.0000 mg | Freq: Once | ORAL | Status: DC | PRN
  Filled 2015-12-25: qty 5

## 2015-12-25 MED ORDER — ACETAMINOPHEN 325 MG PO TABS: 650.0000 mg | ORAL_TABLET | ORAL | Status: DC | PRN

## 2015-12-25 MED ORDER — OXYCODONE HCL 5 MG PO TABS
5.0000 mg | ORAL_TABLET | Freq: Once | ORAL | Status: DC | PRN
  Filled 2015-12-25: qty 1

## 2015-12-25 MED ORDER — MIDAZOLAM HCL 2 MG/2ML IJ SOLN
INTRAMUSCULAR | Status: AC
  Filled 2015-12-25: qty 2

## 2015-12-25 MED ORDER — OXYCODONE HCL 5 MG PO TABS
5.0000 mg | ORAL_TABLET | ORAL | Status: DC | PRN
  Administered 2015-12-25: 5 mg via ORAL

## 2015-12-25 MED ORDER — MORPHINE SULFATE (PF) 10 MG/ML IV SOLN: 2.0000 mg | INTRAVENOUS | Status: DC | PRN

## 2015-12-25 MED ORDER — PROMETHAZINE HCL 25 MG/ML IJ SOLN
6.2500 mg | INTRAMUSCULAR | Status: DC | PRN
  Administered 2015-12-25: 12.5 mg via INTRAVENOUS

## 2015-12-25 MED ORDER — SODIUM CHLORIDE 0.9 % IR SOLN
Status: DC | PRN
  Administered 2015-12-25: 1000 mL

## 2015-12-25 MED ORDER — ONDANSETRON HCL 4 MG/2ML IJ SOLN
INTRAMUSCULAR | Status: DC | PRN
  Administered 2015-12-25: 4 mg via INTRAVENOUS

## 2015-12-25 MED ORDER — SODIUM CHLORIDE 0.9% FLUSH: 3.0000 mL | Freq: Two times a day (BID) | INTRAVENOUS | Status: DC

## 2015-12-25 MED ORDER — ONDANSETRON HCL 4 MG/2ML IJ SOLN: 4.0000 mg | Freq: Four times a day (QID) | INTRAMUSCULAR | Status: DC | PRN

## 2015-12-25 MED ORDER — PROPOFOL 10 MG/ML IV BOLUS
INTRAVENOUS | Status: DC | PRN
  Administered 2015-12-25: 160 mg via INTRAVENOUS

## 2015-12-25 MED ORDER — SODIUM CHLORIDE 0.9 % IV SOLN
Freq: Once | INTRAVENOUS | Status: AC
  Administered 2015-12-25: 08:00:00
  Filled 2015-12-25: qty 1.2

## 2015-12-25 MED ORDER — PROPOFOL 10 MG/ML IV BOLUS
INTRAVENOUS | Status: AC
  Filled 2015-12-25: qty 20

## 2015-12-25 MED ORDER — FENTANYL CITRATE (PF) 100 MCG/2ML IJ SOLN
25.0000 ug | INTRAMUSCULAR | Status: DC | PRN
  Administered 2015-12-25 (×2): 50 ug via INTRAVENOUS

## 2015-12-25 MED ORDER — LIDOCAINE HCL 1 % IJ SOLN
INTRAMUSCULAR | Status: DC | PRN
  Administered 2015-12-25: 60 mg via INTRADERMAL

## 2015-12-25 MED ORDER — ACETAMINOPHEN 650 MG RE SUPP
650.0000 mg | RECTAL | Status: DC | PRN
  Filled 2015-12-25: qty 1

## 2015-12-25 MED ORDER — DEXAMETHASONE SODIUM PHOSPHATE 10 MG/ML IJ SOLN
INTRAMUSCULAR | Status: DC | PRN
  Administered 2015-12-25: 10 mg via INTRAVENOUS

## 2015-12-25 MED ORDER — CIPROFLOXACIN IN D5W 400 MG/200ML IV SOLN
400.0000 mg | INTRAVENOUS | Status: AC
  Administered 2015-12-25: 400 mg via INTRAVENOUS

## 2015-12-25 MED ORDER — FENTANYL CITRATE (PF) 100 MCG/2ML IJ SOLN
INTRAMUSCULAR | Status: DC | PRN
  Administered 2015-12-25 (×4): 25 ug via INTRAVENOUS

## 2015-12-25 MED ORDER — PROMETHAZINE HCL 25 MG/ML IJ SOLN
INTRAMUSCULAR | Status: AC
  Filled 2015-12-25: qty 1

## 2015-12-25 MED ORDER — DEXAMETHASONE SODIUM PHOSPHATE 10 MG/ML IJ SOLN
INTRAMUSCULAR | Status: AC
  Filled 2015-12-25: qty 1

## 2015-12-25 SURGICAL SUPPLY — 38 items
BAG DECANTER FOR FLEXI CONT (MISCELLANEOUS) ×2 IMPLANT
BENZOIN TINCTURE PRP APPL 2/3 (GAUZE/BANDAGES/DRESSINGS) IMPLANT
BLADE HEX COATED 2.75 (ELECTRODE) ×2 IMPLANT
BLADE SURG 15 STRL LF DISP TIS (BLADE) ×1 IMPLANT
BLADE SURG 15 STRL SS (BLADE) ×1
BLADE SURG SZ11 CARB STEEL (BLADE) ×2 IMPLANT
COVER SURGICAL LIGHT HANDLE (MISCELLANEOUS) ×2 IMPLANT
DECANTER SPIKE VIAL GLASS SM (MISCELLANEOUS) ×2 IMPLANT
DRAPE C-ARM 42X120 X-RAY (DRAPES) ×2 IMPLANT
DRAPE LAPAROSCOPIC ABDOMINAL (DRAPES) ×2 IMPLANT
ELECT PENCIL ROCKER SW 15FT (MISCELLANEOUS) ×2 IMPLANT
ELECT REM PT RETURN 9FT ADLT (ELECTROSURGICAL) ×2
ELECTRODE REM PT RTRN 9FT ADLT (ELECTROSURGICAL) ×1 IMPLANT
GAUZE SPONGE 4X4 12PLY STRL (GAUZE/BANDAGES/DRESSINGS) IMPLANT
GAUZE SPONGE 4X4 16PLY XRAY LF (GAUZE/BANDAGES/DRESSINGS) ×2 IMPLANT
GLOVE BIO SURGEON STRL SZ7 (GLOVE) ×6 IMPLANT
GLOVE BIOGEL PI IND STRL 7.0 (GLOVE) ×1 IMPLANT
GLOVE BIOGEL PI IND STRL 7.5 (GLOVE) ×3 IMPLANT
GLOVE BIOGEL PI INDICATOR 7.0 (GLOVE) ×1
GLOVE BIOGEL PI INDICATOR 7.5 (GLOVE) ×3
GOWN STRL REUS W/ TWL XL LVL3 (GOWN DISPOSABLE) ×1 IMPLANT
GOWN STRL REUS W/TWL LRG LVL3 (GOWN DISPOSABLE) ×4 IMPLANT
GOWN STRL REUS W/TWL XL LVL3 (GOWN DISPOSABLE) ×3 IMPLANT
KIT BASIN OR (CUSTOM PROCEDURE TRAY) ×2 IMPLANT
KIT PORT POWER 8FR ISP CVUE (Catheter) ×2 IMPLANT
LIQUID BAND (GAUZE/BANDAGES/DRESSINGS) ×2 IMPLANT
NEEDLE HYPO 25X1 1.5 SAFETY (NEEDLE) ×2 IMPLANT
NS IRRIG 1000ML POUR BTL (IV SOLUTION) ×2 IMPLANT
PACK BASIC VI WITH GOWN DISP (CUSTOM PROCEDURE TRAY) ×2 IMPLANT
SUT MNCRL AB 4-0 PS2 18 (SUTURE) ×2 IMPLANT
SUT PROLENE 2 0 SH DA (SUTURE) ×2 IMPLANT
SUT SILK 2 0 (SUTURE)
SUT SILK 2-0 30XBRD TIE 12 (SUTURE) IMPLANT
SUT VIC AB 3-0 SH 27 (SUTURE) ×1
SUT VIC AB 3-0 SH 27XBRD (SUTURE) ×1 IMPLANT
SYR CONTROL 10ML LL (SYRINGE) ×2 IMPLANT
SYRINGE 10CC LL (SYRINGE) ×2 IMPLANT
TOWEL OR 17X26 10 PK STRL BLUE (TOWEL DISPOSABLE) ×2 IMPLANT

## 2015-12-25 NOTE — Anesthesia Procedure Notes (Signed)
Procedure Name: LMA Insertion Date/Time: 12/25/2015 7:27 AM Performed by: Gaston Islam EVETTE Pre-anesthesia Checklist: Patient identified, Emergency Drugs available, Suction available, Patient being monitored and Timeout performed Patient Re-evaluated:Patient Re-evaluated prior to inductionOxygen Delivery Method: Circle system utilized Preoxygenation: Pre-oxygenation with 100% oxygen Intubation Type: IV induction Ventilation: Mask ventilation without difficulty LMA: LMA inserted LMA Size: 4.0 Tube type: Oral Placement Confirmation: ETT inserted through vocal cords under direct vision,  positive ETCO2,  CO2 detector and breath sounds checked- equal and bilateral Tube secured with: Tape Dental Injury: Teeth and Oropharynx as per pre-operative assessment

## 2015-12-25 NOTE — Anesthesia Postprocedure Evaluation (Signed)
Anesthesia Post Note  Patient: [name]  Procedure(s) Performed: Procedure(s) (LRB): INSERTION PORT-A-CATH WITH ULTRA SOUND (Right)  Patient location during evaluation: PACU Anesthesia Type: General Level of consciousness: awake and alert and patient cooperative Pain management: pain level controlled Vital Signs Assessment: post-procedure vital signs reviewed and stable Respiratory status: spontaneous breathing and respiratory function stable Cardiovascular status: stable Anesthetic complications: no    Last Vitals:  Filed Vitals:   12/25/15 0900 12/25/15 0915  BP: 145/95   Pulse: 87 86  Temp:    Resp: 9 24    Last Pain:  Filed Vitals:   12/25/15 0919  PainSc: [place]

## 2015-12-25 NOTE — Transfer of Care (Signed)
Immediate Anesthesia Transfer of Care Note  Patient: [name]  Procedure(s) Performed: Procedure(s): INSERTION PORT-A-CATH WITH ULTRA SOUND (Right)  Patient Location: PACU  Anesthesia Type:General  Level of Consciousness: awake and alert   Airway & Oxygen Therapy: Patient Spontanous Breathing and Patient connected to face mask oxygen  Post-op Assessment: Report given to RN  Post vital signs: Reviewed and stable  Last Vitals:  Filed Vitals:   12/25/15 0526  BP: 126/71  Pulse: 87  Temp: 36.8 C  Resp: 16    Last Pain:  Filed Vitals:   12/25/15 0537  PainSc: 7       Patients Stated Pain Goal: 5 ([phone])  Complications: No apparent anesthesia complications

## 2015-12-25 NOTE — Discharge Instructions (Signed)
PORT-A-CATH: POST OP INSTRUCTIONS  Always review your discharge instruction sheet given to you by the facility where your surgery was performed.   1. A prescription for pain medication may be given to you upon discharge. Take your pain medication as prescribed, if needed. If narcotic pain medicine is not needed, then you make take acetaminophen (Tylenol) or ibuprofen (Advil) as needed.  2. Take your usually prescribed medications unless otherwise directed. 3. If you need a refill on your pain medication, please contact our office. All narcotic pain medicine now requires a paper prescription.  Phoned in and fax refills are no longer allowed by law.  Prescriptions will not be filled after 5 pm or on weekends.  4. You should follow a light diet for the remainder of the day after your procedure. 5. Most patients will experience some mild swelling and/or bruising in the area of the incision. It may take several days to resolve. 6. It is common to experience some constipation if taking pain medication after surgery. Increasing fluid intake and taking a stool softener (such as Colace) will usually help or prevent this problem from occurring. A mild laxative (Milk of Magnesia or Miralax) should be taken according to package directions if there are no bowel movements after 48 hours.  7. Unless discharge instructions indicate otherwise, you may remove your bandages 48 hours after surgery, and you may shower at that time. You may have steri-strips (small white skin tapes) in place directly over the incision.  These strips should be left on the skin for 7-10 days.  If your surgeon used Dermabond (skin glue) on the incision, you may shower in 24 hours.  The glue will flake off over the next 2-3 weeks.  8. If your port is left accessed at the end of surgery (needle left in port), the dressing cannot get wet and should only by changed by a healthcare professional. When the port is no longer accessed (when the  needle has been removed), follow step 7.   9. ACTIVITIES:  Limit activity involving your arms for the next 72 hours. Do no strenuous exercise or activity for 1 week. You may drive when you are no longer taking prescription pain medication, you can comfortably wear a seatbelt, and you can maneuver your car. 10.You may need to see your doctor in the office for a follow-up appointment.  Please       check with your doctor.  11.When you receive a new Port-a-Cath, you will get a product guide and        ID card.  Please keep them in case you need them.  WHEN TO CALL YOUR DOCTOR [phone]): 1. Fever over 101.0 2. Chills 3. Continued bleeding from incision 4. Increased redness and tenderness at the site 5. Shortness of breath, difficulty breathing   The clinic staff is available to answer your questions during regular business hours. Please dont hesitate to call and ask to speak to one of the nurses or medical assistants for clinical concerns. If you have a medical emergency, go to the nearest emergency room or call 911.  A surgeon from [facility] is always on call at the hospital.     For further information, please visit [email or web address]     General Anesthesia, Adult, Care After Refer to this sheet in the next few weeks. These instructions provide you with information on caring for yourself after your procedure. Your health care provider may also give you more specific instructions. Your  treatment has been planned according to current medical practices, but problems sometimes occur. Call your health care provider if you have any problems or questions after your procedure. WHAT TO EXPECT AFTER THE PROCEDURE After the procedure, it is typical to experience:  Sleepiness.  Nausea and vomiting. HOME CARE INSTRUCTIONS  For the first 24 hours after general anesthesia:  Have a responsible person with you.  Do not drive a car. If you are alone, do not take  public transportation.  Do not drink alcohol.  Do not take medicine that has not been prescribed by your health care provider.  Do not sign important papers or make important decisions.  You may resume a normal diet and activities as directed by your health care provider.  Change bandages (dressings) as directed.  If you have questions or problems that seem related to general anesthesia, call the hospital and ask for the anesthetist or anesthesiologist on call. SEEK MEDICAL CARE IF:  You have nausea and vomiting that continue the day after anesthesia.  You develop a rash. SEEK IMMEDIATE MEDICAL CARE IF:   You have difficulty breathing.  You have chest pain.  You have any allergic problems.   This information is not intended to replace advice given to you by your health care provider. Make sure you discuss any questions you have with your health care provider.   Document Released: 09/29/2000 Document Revised: 07/14/2014 Document Reviewed: 10/22/2011 Elsevier Interactive Patient Education [employer].

## 2015-12-25 NOTE — Anesthesia Preprocedure Evaluation (Signed)
Anesthesia Evaluation  Patient identified by MRN, date of birth, ID band Patient awake    Reviewed: Allergy & Precautions, NPO status , Patient's Chart, lab work & pertinent test results  History of Anesthesia Complications (+) PONV  Airway Mallampati: II   Neck ROM: full    Dental   Pulmonary shortness of breath, asthma ,    breath sounds clear to auscultation       Cardiovascular negative cardio ROS   Rhythm:regular Rate:Normal     Neuro/Psych  Headaches, Anxiety Depression    GI/Hepatic GERD  ,  Endo/Other    Renal/GU      Musculoskeletal   Abdominal   Peds  Hematology   Anesthesia Other Findings   Reproductive/Obstetrics Breast CA                             Anesthesia Physical Anesthesia Plan  ASA: II  Anesthesia Plan: General   Post-op Pain Management:    Induction: Intravenous  Airway Management Planned: LMA  Additional Equipment:   Intra-op Plan:   Post-operative Plan:   Informed Consent: I have reviewed the patients History and Physical, chart, labs and discussed the procedure including the risks, benefits and alternatives for the proposed anesthesia with the patient or authorized representative who has indicated his/her understanding and acceptance.     Plan Discussed with: CRNA, Anesthesiologist and Surgeon  Anesthesia Plan Comments:         Anesthesia Quick Evaluation

## 2015-12-25 NOTE — Op Note (Signed)
Preoperative diagnosis: breast cancer need for venous access Postoperative diagnosis: same as above Procedure: right ij US guided powerport insertion Surgeon: Dr Serita Grammes EBL: minimal Anes: general  Specimens none Complications none Drains none Sponge count correct Dispo to pacu stable  Indications: This is a 54 yof due to begin systemic therapy for breast cancer. We discussed port placement.   Procedure: After informed consent was obtained the patient was taken to the operating room. She was given antibiotics. Sequential compression devices were on her legs. She was then placed under general anesthesia with an LMA. Then she was then prepped and draped in the standard sterile surgical fashion. Surgical timeout was then performed.  I used the ultrasound to identify the right internal jugular vein. I then accessed the vein using the ultrasound. This aspirated blood. I then placed the wire.This was confirmed by fluoroscopy to be in the correct position. I created a pocket on the right chest. I tunneled the line between the 2 sites. I then dilated the tract and placed the dilator assembly with the sheath. This was done under fluoroscopy. I then removed the sheath and dilator. The wire was also removed. The line was then pulled back to be in the distal cava. I hooked this up to the port. I sutured this into place with 2-0 Prolene in 2 places. This aspirated blood and flushed easily.This was confirmed with a final fluoroscopy. I then closed this with 2-0 Vicryl and 4-0 Monocryl. Dermabond was placed on both the incisions.She tolerated this well and was transferred to the recovery room in stable condition.

## 2015-12-25 NOTE — Interval H&P Note (Signed)
History and Physical Interval Note:  12/25/2015 7:06 AM  [name]  has presented today for surgery, with the diagnosis of BREAST CANCER  The various methods of treatment have been discussed with the patient and family. After consideration of risks, benefits and other options for treatment, the patient has consented to  Procedure(s): INSERTION PORT-A-CATH WITH ULTRA SOUND (N/A) as a surgical intervention .  The patient's history has been reviewed, patient examined, no change in status, stable for surgery.  I have reviewed the patient's chart and labs.  Questions were answered to the patient's satisfaction.     WAKEFIELD,MATTHEW

## 2015-12-25 NOTE — H&P (Signed)
[name] is an 40 y.o. female.   Chief Complaint: breast cancer HPI: 68 yof who presented last week after noting a left breast mass in the upper outer quadrant a month ago when she was living in [place]. Her [place] report shows this is a 1.7 cm tumor. I have a path report that says that is invasive ductal carcinoma, grade II, er pos, pr neg, her2 is equivocal. repeat her 2 is also equivocal. since our last visit she has undergone evaluation for genetics which is pending. bilateral breast mri shows right breast negative, left breast has irregular enhancing mass that measures 2.5x2.2x1.8 cm in size with nme extending 1.5 cm anteriorly and 2 cm posteriorly. total nme is 5.3 cm. nodes are normal. she is here for follow up to discuss plan   Past Medical History  Diagnosis Date  . Endometriosis   . Depression   . Anxiety   . Hormone disorder   . Herniated disc, cervical   . Neck pain   . Migraine without aura   . Breast cancer ([place]) 11/20/15    left breast  . Family history of breast cancer   . Allergy   . IBD (inflammatory bowel disease)   . PONV (postoperative nausea and vomiting)   . Shortness of breath dyspnea     anxiety   . History of bronchitis as a child   . Asthma     childhood   . History of urinary tract infection   . GERD (gastroesophageal reflux disease)   . History of stomach ulcers     Past Surgical History  Procedure Laterality Date  . Abdominal hysterectomy      Still has ovaries    Family History  Problem Relation Age of Onset  . Anesthesia problems Neg Hx   . Hypotension Neg Hx   . Malignant hyperthermia Neg Hx   . Pseudochol deficiency Neg Hx   . Sarcoidosis Mother   . Hypertension Father   . Throat cancer Maternal Grandfather     smoker and heavy drinker; dx in his late 80s-50s  . Cancer Paternal Grandmother     possible gatric vs bladder cancer  . Bladder Cancer Paternal Grandmother   . Breast cancer Paternal Aunt     dxin her 80s; dad's  maternal half sister  . Breast cancer Other     PGFs mother   Social History:  reports that she has never smoked. She has never used smokeless tobacco. She reports that she drinks alcohol. She reports that she does not use illicit drugs.  Allergies:  Allergies  Allergen Reactions  . Amoxicillin Shortness Of Breath and Itching    Has patient had a PCN reaction causing immediate rash, facial/tongue/throat swelling, SOB or lightheadedness with hypotension: Yes Has patient had a PCN reaction causing severe rash involving mucus membranes or skin necrosis: No Has patient had a PCN reaction that required hospitalization Yes Has patient had a PCN reaction occurring within the last 10 years: No If all of the above answers are "NO", then may proceed with Cephalosporin use.     Medications Prior to Admission  Medication Sig Dispense Refill  . tamoxifen (NOLVADEX) 20 MG tablet Take 1 tablet (20 mg total) by mouth daily. 90 tablet 12  . traMADol (ULTRAM) 50 MG tablet Take 1 tablet (50 mg total) by mouth every 6 (six) hours as needed for moderate pain. 60 tablet 1  . zolpidem (AMBIEN) 10 MG tablet Take 1/2 to 1 tab po  qhs prn (Patient taking differently: Take 5-10 mg by mouth at bedtime as needed for sleep. Take 1/2 to 1 tab po qhs prn) 30 tablet 0    No results found for this or any previous visit (from the past 48 hour(s)). No results found.  ROS Review of Systems (Sonya Bynum CMA; 12/11/2015 2:26 PM) General Not Present- Appetite Loss, Chills, Fatigue, Fever, Night Sweats, Weight Gain and Weight Loss. Skin Not Present- Change in Wart/Mole, Dryness, Hives, Jaundice, New Lesions, Non-Healing Wounds, Rash and Ulcer. HEENT Present- Earache. Not Present- Hearing Loss, Hoarseness, Nose Bleed, Oral Ulcers, Ringing in the Ears, Seasonal Allergies, Sinus Pain, Sore Throat, Visual Disturbances, Wears glasses/contact lenses and Yellow Eyes. Respiratory Not Present- Bloody sputum, Chronic Cough, Difficulty  Breathing, Snoring and Wheezing. Breast Present- Breast Mass and Breast Pain. Not Present- Nipple Discharge and Skin Changes. Cardiovascular Not Present- Chest Pain, Difficulty Breathing Lying Down, Leg Cramps, Palpitations, Rapid Heart Rate, Shortness of Breath and Swelling of Extremities. Gastrointestinal Present- Change in Bowel Habits, Hemorrhoids and Indigestion. Not Present- Abdominal Pain, Bloating, Bloody Stool, Chronic diarrhea, Constipation, Difficulty Swallowing, Excessive gas, Gets full quickly at meals, Nausea, Rectal Pain and Vomiting. Musculoskeletal Not Present- Back Pain, Joint Pain, Joint Stiffness, Muscle Pain, Muscle Weakness and Swelling of Extremities. Neurological Present- Headaches. Not Present- Decreased Memory, Fainting, Numbness, Seizures, Tingling, Tremor, Trouble walking and Weakness. Psychiatric Present- Anxiety. Not Present- Bipolar, Change in Sleep Pattern, Depression, Fearful and Frequent crying. Hematology Present- Easy Bruising. Not Present- Excessive bleeding, Gland problems, HIV and Persistent Infections.  Vitals (Sonya Bynum CMA; 12/11/2015 2:27 PM) 12/11/2015 2:27 PM Weight: 180.4 lb Height: 64in Body Surface Area: 1.87 m Body Mass Index: 30.97 kg/m  Temp.: 98.14F(Temporal)  Pulse: 84 (Regular)  BP: 126/76 (Sitting, Left Arm, Standard) Physical Exam [name] MD; 12/11/2015 3:22 PM) General Mental Status-Alert. Orientation-Oriented X3.  Chest and Lung Exam Chest and lung exam reveals -on auscultation, normal breath sounds, no adventitious sounds and normal vocal resonance.  Breast Nipples-No Discharge.   Cardiovascular Cardiovascular examination reveals -normal heart sounds, regular rate and rhythm with no murmurs.  Lymphatic Head & Neck  General Head & Neck Lymphatics: Bilateral - Description - Normal. Axillary  General Axillary Region: Bilateral - Description - Normal. Note: no Hopkins adenopathy  Blood pressure  126/71, pulse 87, temperature 98.3 F (36.8 C), temperature source Oral, resp. rate 16, height '5\' 4"'  (1.626 m), weight 81.307 kg (179 lb 4 oz), SpO2 99 %. Physical Exam   Assessment/Plan BREAST CANCER OF UPPER-OUTER QUADRANT OF LEFT FEMALE BREAST (C50.412) Story: She appears to have more disease than from [place]. her her2 is equivocal twice now. I discussed case with dr Jana Hakim. I think awaiting genetics is a good idea but if she is going to undergo chemotherapy and can be treated with antiher2 therapy (which med onc thinks she can) I think doing that in primary setting would be beneficial. we discused again lumpectomy/sn vs mastectomy and their equivalence in the absence of a genetic mutation. we also discussed a reduction lumpectomy which she would like to consider. I will have her see Dr Iran Planas as well will plan for a port placement next week for chemo to begin soon. I tried to call her husband Caroleann Casler at [phone] today  [name], MD 12/25/2015, 7:04 AM

## 2015-12-26 ENCOUNTER — Encounter (HOSPITAL_COMMUNITY): Payer: Self-pay | Admitting: General Surgery

## 2015-12-27 ENCOUNTER — Ambulatory Visit (HOSPITAL_BASED_OUTPATIENT_CLINIC_OR_DEPARTMENT_OTHER)
Admission: RE | Admit: 2015-12-27 | Discharge: 2015-12-27 | Disposition: A | Discharge status: Home / Self Care | Payer: BLUE CROSS/BLUE SHIELD | Source: Ambulatory Visit | Attending: Cardiology | Admitting: Cardiology

## 2015-12-27 ENCOUNTER — Encounter (HOSPITAL_COMMUNITY): Payer: Self-pay

## 2015-12-27 ENCOUNTER — Other Ambulatory Visit (HOSPITAL_COMMUNITY): Payer: BLUE CROSS/BLUE SHIELD

## 2015-12-27 ENCOUNTER — Ambulatory Visit (HOSPITAL_COMMUNITY)
Admission: RE | Admit: 2015-12-27 | Discharge: 2015-12-27 | Disposition: A | Discharge status: Home / Self Care | Payer: BLUE CROSS/BLUE SHIELD | Source: Ambulatory Visit | Attending: General Surgery | Admitting: General Surgery

## 2015-12-27 VITALS — BP 111/72 | HR 68 | Resp 18 | Wt 183.0 lb

## 2015-12-27 DIAGNOSIS — Z01810 Encounter for preprocedural cardiovascular examination: Secondary | ICD-10-CM | POA: Diagnosis present

## 2015-12-27 DIAGNOSIS — C50412 Malignant neoplasm of upper-outer quadrant of left female breast: Secondary | ICD-10-CM

## 2015-12-27 LAB — ECHOCARDIOGRAM COMPLETE
E decel time: 165 msec
E/e' ratio: 5.88
FS: 34 % (ref 28–44)
IVS/LV PW RATIO, ED: 1.06
LA ID, A-P, ES: 32 mm
LA diam end sys: 32 mm
LA diam index: 1.71 cm/m2
LV E/e' medial: 5.88
LV E/e'average: 5.88
LV PW d: 8.85 mm — AB (ref 0.6–1.1)
LV dias vol index: 50 mL/m2
LV dias vol: 93 mL (ref 46–106)
LV e' LATERAL: 13.7 cm/s
LV sys vol index: 17 mL/m2
LV sys vol: 32 mL (ref 14–42)
LVOT area: 2.27 cm2
LVOT diameter: 17 mm
MV Dec: 165
MV Peak grad: 3 mmHg
MV pk A vel: 71.8 m/s
MV pk E vel: 80.5 m/s
Simpson's disk: 66
Stroke v: 61 ml
TDI e' lateral: 13.7
TDI e' medial: 9.9

## 2015-12-27 NOTE — Patient Instructions (Signed)
Follow up and Echo in 3 months with Dr.McLean  

## 2015-12-27 NOTE — Progress Notes (Signed)
Patient ID: [name], female   DOB: March 07, 1976, 40 y.o.   MRN: [id number]   [facility] CONSULT NOTE  Referring Physician: Dr [name]  No Pcp Per Patient as PCP - General (General Practice) Jordan Cruel, MD as Consulting Physician (Oncology) Jordan Bookbinder, MD as Consulting Physician (General Surgery) Jordan Dom, MD as Consulting Physician (Obstetrics and Gynecology)  HPI: 40 y.o. [name] woman status post left breast upper outer quadrant biopsy 11/20/2015 for a clinical T1c N0, stage IA invasive ductal carcinoma, grade 2, estrogen receptor moderately positive, progesterone receptor negative. Also has history of anxiety and endometriosis.     Relocated from [place] to [place] to live with her Mom while she is undergoing treatment. Definitive treatment has not been determined. Currently not working. She is referred to cardio-onc clinic by Dr [name].    FH: Grandmother - MI years ago.   Review of Systems: [y] = yes, _0  = no   General: Weight gain _1 ; Weight loss _2 ; Anorexia _3 ; Fatigue _4 ; Fever _5 ; Chills _6 ; Weakness _7   Cardiac: Chest pain/pressure _8 ; Resting SOB _9 ; Exertional SOB _10 ; Orthopnea _11 ; Pedal Edema _12 ; Palpitations _13 ; Syncope _14 ; Presyncope _15 ; Paroxysmal nocturnal dyspnea_16   Pulmonary: Cough _17 ; Wheezing_18 ; Hemoptysis_19 ; Sputum _20 ; Snoring _21   GI: Vomiting_22 ; Dysphagia_23 ; Melena_24 ; Hematochezia _25 ; Heartburn_26 ; Abdominal pain _27 ; Constipation _28 ; Diarrhea _29 ; BRBPR _30   GU: Hematuria_31 ; Dysuria _32 ; Nocturia_33   Vascular: Pain in legs with walking _34 ; Pain in feet with lying flat _35 ; Non-healing sores _36 ; Stroke _37 ; TIA _38 ; Slurred speech _39 ;  Neuro: Headaches_40 ; Vertigo_41 ; Seizures_42 ; Paresthesias_43 ;Blurred vision _44 ; Diplopia _45 ; Vision changes _46   Ortho/Skin: Arthritis _47 ; Joint pain _48 ; Muscle pain _49 ; Joint swelling _50 ; Back Pain _51 ; Rash _52   Psych: Depression_53 ; Anxiety_54     Heme: Bleeding problems _55 ; Clotting disorders _56 ; Anemia _57   Endocrine: Diabetes _58 ; Thyroid dysfunction_59    Past Medical History  Diagnosis Date  . Endometriosis   . Depression   . Anxiety   . Hormone disorder   . Herniated disc, cervical   . Neck pain   . Migraine without aura   . Breast cancer ([place]) 11/20/15    left breast  . Family history of breast cancer   . Allergy   . IBD (inflammatory bowel disease)   . PONV (postoperative nausea and vomiting)   . Shortness of breath dyspnea     anxiety   . History of bronchitis as a child   . Asthma     childhood   . History of urinary tract infection   . GERD (gastroesophageal reflux disease)   . History of stomach ulcers     Current Outpatient Prescriptions  Medication Sig Dispense Refill  . HYDROcodone-acetaminophen (NORCO) 10-325 MG tablet Take 1 tablet by mouth every 6 (six) hours as needed. 10 tablet 0  . tamoxifen (NOLVADEX) 20 MG tablet Take 1 tablet (20 mg total) by mouth daily. 90 tablet 12  . traMADol (ULTRAM) 50 MG tablet Take 1 tablet (50 mg total) by mouth every 6 (six) hours as needed for moderate pain. 60 tablet 1  . zolpidem (AMBIEN) 10 MG tablet Take 1/2 to 1 tab  po qhs prn (Patient taking differently: Take 5-10 mg by mouth at bedtime as needed for sleep. Take 1/2 to 1 tab po qhs prn) 30 tablet 0   No current facility-administered medications for this encounter.    Allergies  Allergen Reactions  . Amoxicillin Shortness Of Breath and Itching    Has patient had a PCN reaction causing immediate rash, facial/tongue/throat swelling, SOB or lightheadedness with hypotension: Yes Has patient had a PCN reaction causing severe rash involving mucus membranes or skin necrosis: No Has patient had a PCN reaction that required hospitalization Yes Has patient had a PCN reaction occurring within the last 10 years: No If all of the above answers are "NO", then may proceed with Cephalosporin use.       Social History    Social History  . Marital Status: Married    Spouse Name: N/A  . Number of Children: 1  . Years of Education: N/A   Occupational History  . Not on file.   Social History Main Topics  . Smoking status: Never Smoker   . Smokeless tobacco: Never Used  . Alcohol Use: 0.0 oz/week    0 Standard drinks or equivalent per week     Comment: socially   . Drug Use: No  . Sexual Activity:    Partners: Male   Other Topics Concern  . Not on file   Social History Narrative      Family History  Problem Relation Age of Onset  . Anesthesia problems Neg Hx   . Hypotension Neg Hx   . Malignant hyperthermia Neg Hx   . Pseudochol deficiency Neg Hx   . Sarcoidosis Mother   . Hypertension Father   . Throat cancer Maternal Grandfather     smoker and heavy drinker; dx in his late 48s-50s  . Cancer Paternal Grandmother     possible gatric vs bladder cancer  . Bladder Cancer Paternal Grandmother   . Breast cancer Paternal Aunt     dxin her 22s; dad's maternal half sister  . Breast cancer Other     PGFs mother    Danley Danker Vitals:   12/27/15 1032  BP: 111/72  Pulse: 68  Resp: 18  Weight: 183 lb (83.008 kg)  SpO2: 100%    PHYSICAL EXAM: General:  Well appearing. No respiratory difficulty HEENT: normal Neck: supple. no JVD. Carotids 2+ bilat; no bruits. No lymphadenopathy or thryomegaly appreciated. Cor: PMI nondisplaced. Regular rate & rhythm. No rubs, gallops or murmurs. R upper chest scar from porta cath.  Lungs: clear Abdomen: soft, nontender, nondistended. No hepatosplenomegaly. No bruits or masses. Good bowel sounds. Extremities: no cyanosis, clubbing, rash, edema Neuro: alert & oriented x 3, cranial nerves grossly intact. moves all 4 extremities w/o difficulty. Affect pleasant.   ASSESSMENT & PLAN:  1. L Breast Cancer  T1c N0, stage IA invasive ductal carcinoma, grade 2, estrogen receptor moderately positive, progesterone receptor negative, HER-2 equivocal by both  immunohistochemistry and FISH  Follow up in 3 months with an ECHO.   Darrick Grinder, NP-C 12/27/15  Patient seen with NP, agree with the above note.  Awaiting surgical pathology to determine final treatment plan (will possibly be getting Herceptin).  I reviewed her echo today, it is a normal study.    If she is treated with Herceptin or Adriamycin, will repeat echo in 3 months.  We discussed potential cardiotoxicities of breast cancer chemotherapy today.   Loralie Champagne 12/27/2015

## 2015-12-27 NOTE — Progress Notes (Signed)
Echocardiogram 2D Echocardiogram has been performed.  [name] 12/27/2015, 10:01 AM

## 2015-12-28 ENCOUNTER — Ambulatory Visit (HOSPITAL_BASED_OUTPATIENT_CLINIC_OR_DEPARTMENT_OTHER): Payer: BLUE CROSS/BLUE SHIELD | Admitting: Oncology

## 2015-12-28 ENCOUNTER — Telehealth: Payer: Self-pay | Admitting: Oncology

## 2015-12-28 VITALS — BP 131/98 | HR 82 | Temp 98.7°F | Resp 18 | Ht 64.0 in | Wt 182.7 lb

## 2015-12-28 DIAGNOSIS — Z170 Estrogen receptor positive status [ER+]: Secondary | ICD-10-CM | POA: Diagnosis not present

## 2015-12-28 DIAGNOSIS — C50412 Malignant neoplasm of upper-outer quadrant of left female breast: Secondary | ICD-10-CM | POA: Diagnosis not present

## 2015-12-28 MED ORDER — DEXAMETHASONE 4 MG PO TABS: 8.0000 mg | ORAL_TABLET | Freq: Two times a day (BID) | ORAL | Status: DC

## 2015-12-28 MED ORDER — VENLAFAXINE HCL ER 75 MG PO CP24: 75.0000 mg | ORAL_CAPSULE | Freq: Every day | ORAL | Status: DC

## 2015-12-28 MED ORDER — LIDOCAINE-PRILOCAINE 2.5-2.5 % EX CREA: TOPICAL_CREAM | CUTANEOUS | Status: DC

## 2015-12-28 MED ORDER — PROCHLORPERAZINE MALEATE 10 MG PO TABS: 10.0000 mg | ORAL_TABLET | Freq: Four times a day (QID) | ORAL | Status: DC | PRN

## 2015-12-28 MED ORDER — LORAZEPAM 0.5 MG PO TABS
0.5000 mg | ORAL_TABLET | Freq: Every evening | ORAL | Status: DC | PRN
Start: 2015-12-28 — End: 2016-02-08

## 2015-12-28 MED ORDER — TRAMADOL HCL 50 MG PO TABS: 50.0000 mg | ORAL_TABLET | Freq: Four times a day (QID) | ORAL | Status: DC | PRN

## 2015-12-28 NOTE — Progress Notes (Signed)
[place]  [phone]) [phone] Fax:(336) [phone]     ID: GRACIELYNN BIRKEL DOB: 07-Apr-1976  MR#: [id number]  WNI#:[id number]  Patient Care Team: No Pcp Per Patient as PCP - General (General Practice) [name], MD as Consulting Physician (Oncology) [name], MD as Consulting Physician (General Surgery) [name], MD as Consulting Physician (Obstetrics and Gynecology) PCP: No PCP Per Patient OTHER MD:  CHIEF COMPLAINT: Estrogen receptor positive breast cancer  CURRENT TREATMENT: neoadjuvant chemotherapy and anti-HER-2 immunotherapy   BREAST CANCER HISTORY: From the original intake note:  Zennie woke up the morning of 11/09/2015 with some pain in her left axilla. She examined herself and found a lump in her left breast. She saw her gynecologist the same day, and she confirms a lump. The patient then proceeded directly to mammography. I do not have that report. However it did show the mass. [name] was then scheduled for ultrasound-guided biopsy 11/21/2015. This showed ([place] [phone] at the [facility]) and invasive ductal carcinoma, grade 2 estrogen receptor positive, with moderate intensity (10-50%), progesterone receptor negative, and HER-2 equivocal by immunohistochemistry. Fish was obtained and was equivocal as well, with a signals ratio of 1.8 to, the number per cell being 5.7.  On 12/05/2015 the patient had a CT/ angiogram of the chest for evaluation of her left-sided chest pain. This showed no evidence of a clot. The left breast nodule measured 1.3 cm on the study. There was no enlarged axillary adenopathy. There was also no evidence of lung metastasis or blastic or destructive lytic bone lesions. The upper abdomen was unremarkable.  With that information the patient presents for further evaluation and treatment.  INTERVAL HISTORY: [name] returns today for follow-up of her triple positive breast cancer  accompanied by her mother Claiborne Billings. Since the last visit here her tumor was again tested for HER-2 and it again came back equivocal. In a young woman whose tumor is repeatedly equivocal for HER-2 the best strategy is to treat as positive which is what we are doing.  Since her last visit here she had her port placed and an echocardiogram, with results pending. She is here today to discuss the upcoming treatment area  REVIEW OF SYSTEMS: [name] had some pain related to the port placement but she is no longer using the hydrocodone. It made her slightly nauseated. She tolerated the tamoxifen well except for feeling of morning sickness she sets. Recall she has not had a period since 2007. She feels anxious, and tells me anxiety has been an ongoing problem for her in the past. She would like "something" for that. She is not exercising regularly at this point. A detailed review of systems today was otherwise benign    PAST MEDICAL HISTORY: Past Medical History  Diagnosis Date  . Endometriosis   . Depression   . Anxiety   . Hormone disorder   . Herniated disc, cervical   . Neck pain   . Migraine without aura   . Breast cancer ([place]) 11/20/15    left breast  . Family history of breast cancer   . Allergy   . IBD (inflammatory bowel disease)   . PONV (postoperative nausea and vomiting)   . Shortness of breath dyspnea     anxiety   . History of bronchitis as a child   . Asthma     childhood   . History of urinary tract infection   . GERD (gastroesophageal reflux disease)   .  History of stomach ulcers     PAST SURGICAL HISTORY: Past Surgical History  Procedure Laterality Date  . Abdominal hysterectomy      Still has ovaries  . Portacath placement Right 12/25/2015    Procedure: INSERTION PORT-A-CATH WITH ULTRA SOUND;  Surgeon: [name], MD;  Location: [facility];  Service: General;  Laterality: Right;    FAMILY HISTORY Family History  Problem Relation Age of Onset  . Anesthesia  problems Neg Hx   . Hypotension Neg Hx   . Malignant hyperthermia Neg Hx   . Pseudochol deficiency Neg Hx   . Sarcoidosis Mother   . Hypertension Father   . Throat cancer Maternal Grandfather     smoker and heavy drinker; dx in his late 73s-50s  . Cancer Paternal Grandmother     possible gatric vs bladder cancer  . Bladder Cancer Paternal Grandmother   . Breast cancer Paternal Aunt     dxin her 73s; dad's maternal half sister  . Breast cancer Other     PGFs mother  The patient's parents are both living, in their early 70s. The patient has one brother and one sister. On the father's side 1 great grandmother was diagnosed with breast cancer in her 75s. The patient's father's mother was diagnosed with stomach cancer. One of the patient's father's sisters was diagnosed with breast cancer in her 73s. On the maternal side patient's mother's father was diagnosed with throat cancer at age.   GYNECOLOGIC HISTORY:  No LMP recorded. Patient has had a hysterectomy.  menarche age 40, first live birth age 40. The patient is GX P1. She stopped having periods in 2008, when she underwent a simple hysterectomy without salpingo-oophorectomy. She has a history of endometriosis and was started on Provera in March of this year, with her next dose due next week. (However she has decided to forego further Provera treatments at least for now).   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 He is licensed in [place] but not in [place] the patient's son [name] is a Ship broker in music at [employer]: Not in place   HEALTH MAINTENANCE: Social History  Substance Use Topics  . Smoking status: Never Smoker   . Smokeless tobacco: Never Used  . Alcohol Use: 0.0 oz/week    0 Standard drinks or equivalent per week     Comment: socially       Colonoscopy:  PAP:  Bone density:  Lipid panel:  Allergies  Allergen Reactions  . Amoxicillin Shortness Of Breath and Itching    Has patient had a PCN reaction causing immediate rash, facial/tongue/throat swelling, SOB or lightheadedness with hypotension: Yes Has patient had a PCN reaction causing severe rash involving mucus membranes or skin necrosis: No Has patient had a PCN reaction that required hospitalization Yes Has patient had a PCN reaction occurring within the last 10 years: No If all of the above answers are "NO", then may proceed with Cephalosporin use.     Current Outpatient Prescriptions  Medication Sig Dispense Refill  . dexamethasone (DECADRON) 4 MG tablet Take 2 tablets (8 mg total) by mouth 2 (two) times daily. Start the day before Taxotere. Then again the day after chemo for 3 days. 30 tablet 1  . lidocaine-prilocaine (EMLA) cream Apply to affected area once 30 g 3  .  LORazepam (ATIVAN) 0.5 MG tablet Take 1 tablet (0.5 mg total) by mouth at bedtime as needed (Nausea or vomiting). 30 tablet 0  . prochlorperazine (COMPAZINE) 10 MG tablet Take 1 tablet (10 mg total) by mouth every 6 (six) hours as needed (Nausea or vomiting). 30 tablet 1  . traMADol (ULTRAM) 50 MG tablet Take 1 tablet (50 mg total) by mouth every 6 (six) hours as needed for moderate pain. 60 tablet 1  . venlafaxine XR (EFFEXOR-XR) 75 MG 24 hr capsule Take 1 capsule (75 mg total) by mouth daily with breakfast. 90 capsule 4  . zolpidem (AMBIEN) 10 MG tablet Take 1/2 to 1 tab po qhs prn (Patient taking differently: Take 5-10 mg by mouth at bedtime as needed for sleep. Take 1/2 to 1 tab po qhs prn) 30 tablet 0   No current facility-administered medications for this visit.    OBJECTIVE: Early middle-aged African-American woman in no acute distress  Filed Vitals:   12/28/15 0818  BP: 131/98  Pulse: 82  Temp: 98.7 F (37.1 C)  Resp: 18     Body mass index is 31.34 kg/(m^2).    ECOG FS:1 -  Symptomatic but completely ambulatory  Sclerae unicteric, pupils round and equal Oropharynx clear and moist-- no thrush or other lesions No cervical or supraclavicular adenopathy Lungs no rales or rhonchi Heart regular rate and rhythm Abd soft, nontender, positive bowel sounds MSK no focal spinal tenderness, no upper extremity lymphedema Neuro: nonfocal, well oriented, appropriate affect Breasts: The right breast is unremarkable. In the left breast upper outer quadrant, palpating from the lateral side, there is an easily palpable firm mass which is movable and appears to measure approximately 2 cm. There are no skin or nipple changes. The left axilla is benign.    LAB RESULTS:  CMP     Component Value Date/Time   NA 139 12/12/2015 1120   NA 138 12/04/2015 1858   K 4.6 12/12/2015 1120   K 3.9 12/04/2015 1858   CL 107 12/04/2015 1858   CO2 26 12/12/2015 1120   CO2 25 12/04/2015 1858   GLUCOSE 84 12/12/2015 1120   GLUCOSE 89 12/04/2015 1858   BUN 12.4 12/12/2015 1120   BUN 13 12/04/2015 1858   CREATININE 0.9 12/12/2015 1120   CREATININE 0.78 12/04/2015 1858   CALCIUM 9.9 12/12/2015 1120   CALCIUM 9.7 12/04/2015 1858   PROT 8.0 12/12/2015 1120   PROT 7.6 05/17/2015 1215   ALBUMIN 4.1 12/12/2015 1120   ALBUMIN 4.5 05/17/2015 1215   AST 20 12/12/2015 1120   AST 20 05/17/2015 1215   ALT 26 12/12/2015 1120   ALT 20 05/17/2015 1215   ALKPHOS 54 12/12/2015 1120   ALKPHOS 51 05/17/2015 1215   BILITOT 0.40 12/12/2015 1120   BILITOT 0.6 05/17/2015 1215   GFRNONAA >60 12/04/2015 1858   GFRAA >60 12/04/2015 1858    INo results found for: SPEP, UPEP  Lab Results  Component Value Date   WBC 7.8 12/12/2015   NEUTROABS 4.5 12/12/2015   HGB 13.2 12/12/2015   HCT 38.2 12/12/2015   MCV 85.8 12/12/2015   PLT 287 12/12/2015      Chemistry      Component Value Date/Time   NA 139 12/12/2015 1120   NA 138 12/04/2015 1858   K 4.6 12/12/2015 1120   K 3.9 12/04/2015 1858   CL  107 12/04/2015 1858   CO2 26 12/12/2015 1120   CO2 25 12/04/2015 1858   BUN 12.4 12/12/2015  1120   BUN 13 12/04/2015 1858   CREATININE 0.9 12/12/2015 1120   CREATININE 0.78 12/04/2015 1858      Component Value Date/Time   CALCIUM 9.9 12/12/2015 1120   CALCIUM 9.7 12/04/2015 1858   ALKPHOS 54 12/12/2015 1120   ALKPHOS 51 05/17/2015 1215   AST 20 12/12/2015 1120   AST 20 05/17/2015 1215   ALT 26 12/12/2015 1120   ALT 20 05/17/2015 1215   BILITOT 0.40 12/12/2015 1120   BILITOT 0.6 05/17/2015 1215       No results found for: LABCA2  No components found for: LABCA125  No results for input(s): INR in the last 168 hours.  Urinalysis    Component Value Date/Time   COLORURINE STRAW* 12/11/2008 1923   APPEARANCEUR CLEAR 12/11/2008 1923   LABSPEC 1.020 12/11/2008 1923   PHURINE 6.0 12/11/2008 1923   GLUCOSEU NEGATIVE 12/11/2008 1923   HGBUR NEGATIVE 12/11/2008 1923   BILIRUBINUR NEGATIVE 12/11/2008 1923   KETONESUR NEGATIVE 12/11/2008 1923   PROTEINUR NEGATIVE 12/11/2008 1923   UROBILINOGEN 0.2 12/11/2008 1923   NITRITE NEGATIVE 12/11/2008 1923   LEUKOCYTESUR  12/11/2008 1923    NEGATIVE MICROSCOPIC NOT DONE ON URINES WITH NEGATIVE PROTEIN, BLOOD, LEUKOCYTES, NITRITE, OR GLUCOSE <1000 mg/dL.      ELIGIBLE FOR AVAILABLE RESEARCH PROTOCOL: no  STUDIES: Dg Chest 2 View  12/04/2015  CLINICAL DATA:  Acute onset of left-sided chest pain and cough. Initial encounter. EXAM: CHEST  2 VIEW COMPARISON:  Thoracic spine radiographs performed 04/29/2014 FINDINGS: The lungs are well-aerated and clear. There is no evidence of focal opacification, pleural effusion or pneumothorax. The heart is normal in size; the mediastinal contour is within normal limits. No acute osseous abnormalities are seen. IMPRESSION: No acute cardiopulmonary process seen. Electronically Signed   By: Roanna Raider M.D.   On: 12/04/2015 19:53   Ct Angio Chest Pe W/cm &/or Wo Cm  12/05/2015  CLINICAL DATA:   Left-sided chest pain. Cough. Symptoms for 3 days. Recent diagnosis of left breast cancer. EXAM: CT ANGIOGRAPHY CHEST WITH CONTRAST TECHNIQUE: Multidetector CT imaging of the chest was performed using the standard protocol during bolus administration of intravenous contrast. Multiplanar CT image reconstructions and MIPs were obtained to evaluate the vascular anatomy. CONTRAST:  100 cc Isovue 370 IV COMPARISON:  Chest radiograph 1 day prior. FINDINGS: There are no filling defects within the pulmonary arteries to suggest pulmonary embolus to the level of the segmental branches. The subsegmental branches cannot be assessed due to contrast bolus timing. Normal caliber thoracic aorta without evidence of dissection. There is a prominent right hilar lymph node measuring 12 mm. Subcarinal lymph node measures 10 mm. No left hilar adenopathy. No pericardial effusion. Heterogeneous attenuation of the lower lobes may be due to air trapping or atelectasis. No confluent consolidation. Linear atelectasis in the right upper lobe. No pulmonary nodule or mass. No pleural effusion. No enlarged axillary lymph nodes. Nodule in the left breast measures 13 mm. No acute abnormality in the upper abdomen. There are no acute or suspicious osseous abnormalities. No blastic or destructive lytic lesions. Review of the MIP images confirms the above findings. IMPRESSION: 1. No pulmonary embolus. 2. Prominent right hilar lymph node measures 12 mm, nonspecific. 3. Heterogeneous attenuation of the lower lobes may be due to air trapping or atelectasis. Electronically Signed   By: Rubye Oaks M.D.   On: 12/05/2015 02:43   Mr Breast Bilateral W Wo Contrast  12/18/2015  CLINICAL DATA:  Recently diagnosed left breast  grade 2 estrogen receptor positive invasive ductal carcinoma. The diagnosis was made at the [facility]. Patient is referred for pre treatment breast MRI. LABS:  Not applicable EXAM: BILATERAL BREAST MRI WITH AND WITHOUT  CONTRAST TECHNIQUE: Multiplanar, multisequence MR images of both breasts were obtained prior to and following the intravenous administration of 17 ml of MultiHance. THREE-DIMENSIONAL MR IMAGE RENDERING ON INDEPENDENT WORKSTATION: Three-dimensional MR images were rendered by post-processing of the original MR data on an independent workstation. The three-dimensional MR images were interpreted, and findings are reported in the following complete MRI report for this study. Three dimensional images were evaluated at the independent DynaCad workstation COMPARISON:  Mammograms from [facility] dated 12/12/2015 as well as left breast ultrasound from [facility] imaging dated 12/12/2015. The mammograms and ultrasound from the [facility] are not available for comparison at this time. FINDINGS: Breast composition: b. Scattered fibroglandular tissue. Background parenchymal enhancement: Moderate. Right breast: No mass or abnormal enhancement. Left breast: There is an irregular enhancing mass located within the posterior 1/3 of the upper-outer quadrant of the left breast. The mass is associated with washout enhancement kinetics. There is a signal void centrally located within the mass related to the patient's recent image guided biopsy. The mass measures 2.5 x 2.2 x 1.8 cm in size. Non mass enhancement extends 1.5 cm anteriorly and also 2.0 cm posteriorly. The combination of the non mass enhancement and mass spans 5.3 cm in the AP dimension. There are no additional areas of worrisome enhancement within the left breast. Lymph nodes: No abnormal appearing lymph nodes. Ancillary findings:  None. IMPRESSION: 2.5 cm irregular enhancing mass located within the posterior 1/3 of the upper-outer quadrant of the left breast corresponding to the known biopsy-proven malignancy. Non mass enhancement also extends anteriorly and posteriorly from the mass and in aggregate the enhancement associated with the mass and the non mass  enhancement span 5.3 cm in the AP dimension. RECOMMENDATION: Treatment plan. BI-RADS CATEGORY  6: Known biopsy-proven malignancy. Electronically Signed   By: Altamese Cabal M.D.   On: 12/18/2015 10:21   Dg Chest Port 1 View  12/25/2015  CLINICAL DATA:  Status post Port-A-Cath placement on the right today. EXAM: PORTABLE CHEST 1 VIEW COMPARISON:  PA and lateral chest 12/04/2015.  CT chest 12/05/2015. FINDINGS: New right IJ approach Port-A-Cath is in place with the tip projecting in the mid superior vena cava. Tubing is intact. Lungs are clear. No pneumothorax. Heart size normal. No focal bony abnormality. IMPRESSION: Tip of Port-A-Cath projects in the mid superior vena cava. Negative for pneumothorax or acute disease. Electronically Signed   By: Inge Rise M.D.   On: 12/25/2015 09:08   Dg C-arm 1-60 Min-no Report  12/25/2015  CLINICAL DATA: surgery C-ARM 1-60 MINUTES Fluoroscopy was utilized by the requesting physician.  No radiographic interpretation.    ASSESSMENT: 40 y.o. [place] woman status post left breast upper outer quadrant biopsy 11/20/2015 for a clinical T1c N0, stage IA invasive ductal carcinoma, grade 2, estrogen receptor moderately positive, progesterone receptor negative, HER-2 equivocal by both immunohistochemistry and FISH  (1) neoadjuvant tamoxifen started 12/11/2015, stopped at the start of chemotherapy  (2) genetics testing 12/12/2015  (3) neoadjuvant chemotherapy consisting of carboplatin, docetaxel, trastuzumab and pertuzumab every 21 days 6 starting 01/01/2016  (4) definitive surgery to follow  (5) adjuvant radiation to follow surgery   (6) anti-estrogens to be continued 5-10 years, more likely the latter    PLAN:  we spent approximately 50 minutes today  going over [name]'s situation in detail with her and her mom. She understands we are treating her as a triple positive breast cancer and that in that context the rate of complete pathologic response is very  high. We reviewed the possible toxicities, side effects and complications of the chemotherapy agents and also the immunotherapy agents.    I gave her a copy of the "roadmap" instructions on how to take her antinausea and other supportive medicines including Imodium for diarrhea as needed. All the prescriptions were placed today and she was given written prescriptions for tramadol and lorazepam to take to the pharmacy.  We also discussed venlafaxine, which will be helpful for her anxiety as well as her hot flashes. She will start at 75 mg daily. She will let me know she has any problems from that.  Results of genetics testing and the echocardiogram she had yesterday are still pending.  She will take her pretreatment Decadron on Monday and on the same day she will meet with our chemotherapy nurse to review side effects of treatment once again and to have a tumor of the treatment area. She will then start therapy 01/01/2016 and she will see me the following week. I strongly urged her to call us with any problems that may develop following treatment.  The overall plan then is to get her through 6 cycles of chemotherapy, with trastuzumab to be continued for total of a year; with surgery to follow the chemotherapy and then adjuvant radiation and anti-estrogens.   [name], MD   12/28/2015 9:03 AM Medical Oncology and Hematology [facility] [address] [place], [place] [address] Tel. [phone]    Fax. [phone]  grade

## 2015-12-28 NOTE — Telephone Encounter (Signed)
appt made and avs printed. Staff message sent to override GM schedule 7/25 at 815 am

## 2015-12-31 ENCOUNTER — Other Ambulatory Visit: Payer: Self-pay | Admitting: *Deleted

## 2015-12-31 ENCOUNTER — Other Ambulatory Visit: Payer: BLUE CROSS/BLUE SHIELD

## 2015-12-31 DIAGNOSIS — C50412 Malignant neoplasm of upper-outer quadrant of left female breast: Secondary | ICD-10-CM

## 2016-01-01 ENCOUNTER — Other Ambulatory Visit (HOSPITAL_BASED_OUTPATIENT_CLINIC_OR_DEPARTMENT_OTHER): Payer: BLUE CROSS/BLUE SHIELD

## 2016-01-01 ENCOUNTER — Ambulatory Visit (HOSPITAL_BASED_OUTPATIENT_CLINIC_OR_DEPARTMENT_OTHER): Payer: BLUE CROSS/BLUE SHIELD

## 2016-01-01 ENCOUNTER — Other Ambulatory Visit: Payer: Self-pay | Admitting: Oncology

## 2016-01-01 VITALS — BP 141/77 | HR 86 | Temp 98.4°F | Resp 16

## 2016-01-01 DIAGNOSIS — Z5111 Encounter for antineoplastic chemotherapy: Secondary | ICD-10-CM | POA: Diagnosis not present

## 2016-01-01 DIAGNOSIS — Z5189 Encounter for other specified aftercare: Secondary | ICD-10-CM

## 2016-01-01 DIAGNOSIS — C50412 Malignant neoplasm of upper-outer quadrant of left female breast: Secondary | ICD-10-CM

## 2016-01-01 DIAGNOSIS — Z5112 Encounter for antineoplastic immunotherapy: Secondary | ICD-10-CM | POA: Diagnosis not present

## 2016-01-01 LAB — COMPREHENSIVE METABOLIC PANEL
ALT: 114 U/L — ABNORMAL HIGH (ref 0–55)
AST: 61 U/L — ABNORMAL HIGH (ref 5–34)
Albumin: 3.9 g/dL (ref 3.5–5.0)
Alkaline Phosphatase: 62 U/L (ref 40–150)
Anion Gap: 13 mEq/L — ABNORMAL HIGH (ref 3–11)
BUN: 10.9 mg/dL (ref 7.0–26.0)
CO2: 19 mEq/L — ABNORMAL LOW (ref 22–29)
Calcium: 10 mg/dL (ref 8.4–10.4)
Chloride: 107 mEq/L (ref 98–109)
Creatinine: 0.9 mg/dL (ref 0.6–1.1)
EGFR: >90 mL/min/{1.73_m2} (ref >90)
Glucose: 131 mg/dl (ref 70–140)
Potassium: 4.2 mEq/L (ref 3.5–5.1)
Sodium: 138 mEq/L (ref 136–145)
Total Bilirubin: 0.43 mg/dL (ref 0.20–1.20)
Total Protein: 7.9 g/dL (ref 6.4–8.3)

## 2016-01-01 LAB — CBC WITH DIFFERENTIAL/PLATELET
BASO%: 0.2 % (ref 0.0–2.0)
Basophils Absolute: 0 10*3/uL (ref 0.0–0.1)
EOS%: 0 % (ref 0.0–7.0)
Eosinophils Absolute: 0 10*3/uL (ref 0.0–0.5)
HCT: 38.1 % (ref 34.8–46.6)
HGB: 12.6 g/dL (ref 11.6–15.9)
LYMPH%: 7.9 % — ABNORMAL LOW (ref 14.0–49.7)
MCH: 28.9 pg (ref 25.1–34.0)
MCHC: 33.1 g/dL (ref 31.5–36.0)
MCV: 87.2 fL (ref 79.5–101.0)
MONO#: 1.4 10*3/uL — ABNORMAL HIGH (ref 0.1–0.9)
MONO%: 7.1 % (ref 0.0–14.0)
NEUT#: 17 10*3/uL — ABNORMAL HIGH (ref 1.5–6.5)
NEUT%: 84.8 % — ABNORMAL HIGH (ref 38.4–76.8)
Platelets: 286 10*3/uL (ref 145–400)
RBC: 4.37 10*6/uL (ref 3.70–5.45)
RDW: 13.1 % (ref 11.2–14.5)
WBC: 20.1 10*3/uL — ABNORMAL HIGH (ref 3.9–10.3)
lymph#: 1.6 10*3/uL (ref 0.9–3.3)

## 2016-01-01 MED ORDER — ACETAMINOPHEN 325 MG PO TABS
650.0000 mg | ORAL_TABLET | Freq: Once | ORAL | Status: AC
  Administered 2016-01-01: 650 mg via ORAL

## 2016-01-01 MED ORDER — SODIUM CHLORIDE 0.9 % IV SOLN
10.0000 mg | Freq: Once | INTRAVENOUS | Status: AC
  Administered 2016-01-01: 10 mg via INTRAVENOUS
  Filled 2016-01-01: qty 1

## 2016-01-01 MED ORDER — ACETAMINOPHEN 325 MG PO TABS
ORAL_TABLET | ORAL | Status: AC
  Filled 2016-01-01: qty 2

## 2016-01-01 MED ORDER — DIPHENHYDRAMINE HCL 25 MG PO CAPS
ORAL_CAPSULE | ORAL | Status: AC
  Filled 2016-01-01: qty 1

## 2016-01-01 MED ORDER — PEGFILGRASTIM 6 MG/0.6ML ~~LOC~~ PSKT
6.0000 mg | PREFILLED_SYRINGE | Freq: Once | SUBCUTANEOUS | Status: AC
  Administered 2016-01-01: 6 mg via SUBCUTANEOUS
  Filled 2016-01-01: qty 0.6

## 2016-01-01 MED ORDER — SODIUM CHLORIDE 0.9 % IV SOLN
840.0000 mg | Freq: Once | INTRAVENOUS | Status: AC
  Administered 2016-01-01: 840 mg via INTRAVENOUS
  Filled 2016-01-01: qty 28

## 2016-01-01 MED ORDER — PALONOSETRON HCL INJECTION 0.25 MG/5ML
0.2500 mg | Freq: Once | INTRAVENOUS | Status: AC
  Administered 2016-01-01: 0.25 mg via INTRAVENOUS

## 2016-01-01 MED ORDER — PALONOSETRON HCL INJECTION 0.25 MG/5ML
INTRAVENOUS | Status: AC
  Filled 2016-01-01: qty 5

## 2016-01-01 MED ORDER — DIPHENHYDRAMINE HCL 25 MG PO CAPS
25.0000 mg | ORAL_CAPSULE | Freq: Once | ORAL | Status: AC
  Administered 2016-01-01: 25 mg via ORAL

## 2016-01-01 MED ORDER — DOCETAXEL CHEMO INJECTION 160 MG/16ML
75.0000 mg/m2 | Freq: Once | INTRAVENOUS | Status: AC
  Administered 2016-01-01: 140 mg via INTRAVENOUS
  Filled 2016-01-01: qty 14

## 2016-01-01 MED ORDER — SODIUM CHLORIDE 0.9 % IV SOLN
656.0000 mg | Freq: Once | INTRAVENOUS | Status: AC
  Administered 2016-01-01: 660 mg via INTRAVENOUS
  Filled 2016-01-01: qty 66

## 2016-01-01 MED ORDER — TRASTUZUMAB CHEMO INJECTION 440 MG
8.0000 mg/kg | Freq: Once | INTRAVENOUS | Status: AC
  Administered 2016-01-01: 651 mg via INTRAVENOUS
  Filled 2016-01-01: qty 31

## 2016-01-01 MED ORDER — HEPARIN SOD (PORK) LOCK FLUSH 100 UNIT/ML IV SOLN
500.0000 [IU] | Freq: Once | INTRAVENOUS | Status: AC | PRN
  Administered 2016-01-01: 500 [IU]
  Filled 2016-01-01: qty 5

## 2016-01-01 MED ORDER — SODIUM CHLORIDE 0.9 % IV SOLN
Freq: Once | INTRAVENOUS | Status: AC
  Administered 2016-01-01: 10:00:00 via INTRAVENOUS

## 2016-01-01 MED ORDER — SODIUM CHLORIDE 0.9% FLUSH
10.0000 mL | INTRAVENOUS | Status: DC | PRN
  Administered 2016-01-01: 10 mL
  Filled 2016-01-01: qty 10

## 2016-01-01 NOTE — Patient Instructions (Addendum)
[facility] Discharge Instructions for Patients Receiving Chemotherapy  Today you received the following chemotherapy agents taxotere/carboplatin/herceptin/perjeta  To help prevent nausea and vomiting after your treatment, we encourage you to take your nausea medication as directed   If you develop nausea and vomiting that is not controlled by your nausea medication, call the clinic.   BELOW ARE SYMPTOMS THAT SHOULD BE REPORTED IMMEDIATELY:  *FEVER GREATER THAN 100.5 F  *CHILLS WITH OR WITHOUT FEVER  NAUSEA AND VOMITING THAT IS NOT CONTROLLED WITH YOUR NAUSEA MEDICATION  *UNUSUAL SHORTNESS OF BREATH  *UNUSUAL BRUISING OR BLEEDING  TENDERNESS IN MOUTH AND THROAT WITH OR WITHOUT PRESENCE OF ULCERS  *URINARY PROBLEMS  *BOWEL PROBLEMS  UNUSUAL RASH Items with * indicate a potential emergency and should be followed up as soon as possible.  Feel free to call the clinic you have any questions or concerns. The clinic phone number is [phone].  Trastuzumab injection for infusion What is this medicine? TRASTUZUMAB (tras TOO zoo mab) is a monoclonal antibody. It is used to treat breast cancer and stomach cancer. This medicine may be used for other purposes; ask your health care provider or pharmacist if you have questions. What should I tell my health care provider before I take this medicine? They need to know if you have any of these conditions: -heart disease -heart failure -infection (especially a virus infection such as chickenpox, cold sores, or herpes) -lung or breathing disease, like asthma -recent or ongoing radiation therapy -an unusual or allergic reaction to trastuzumab, benzyl alcohol, or other medications, foods, dyes, or preservatives -pregnant or trying to get pregnant -breast-feeding How should I use this medicine? This drug is given as an infusion into a vein. It is administered in a hospital or clinic by a specially trained health care  professional. Talk to your pediatrician regarding the use of this medicine in children. This medicine is not approved for use in children. Overdosage: If you think you have taken too much of this medicine contact a poison control center or emergency room at once. NOTE: This medicine is only for you. Do not share this medicine with others. What if I miss a dose? It is important not to miss a dose. Call your doctor or health care professional if you are unable to keep an appointment. What may interact with this medicine? -doxorubicin -warfarin This list may not describe all possible interactions. Give your health care provider a list of all the medicines, herbs, non-prescription drugs, or dietary supplements you use. Also tell them if you smoke, drink alcohol, or use illegal drugs. Some items may interact with your medicine. What should I watch for while using this medicine? Visit your doctor for checks on your progress. Report any side effects. Continue your course of treatment even though you feel ill unless your doctor tells you to stop. Call your doctor or health care professional for advice if you get a fever, chills or sore throat, or other symptoms of a cold or flu. Do not treat yourself. Try to avoid being around people who are sick. You may experience fever, chills and shaking during your first infusion. These effects are usually mild and can be treated with other medicines. Report any side effects during the infusion to your health care professional. Fever and chills usually do not happen with later infusions. Do not become pregnant while taking this medicine or for 7 months after stopping it. Women should inform their doctor if they wish to become pregnant  or think they might be pregnant. Women of child-bearing potential will need to have a negative pregnancy test before starting this medicine. There is a potential for serious side effects to an unborn child. Talk to your health care  professional or pharmacist for more information. Do not breast-feed an infant while taking this medicine or for 7 months after stopping it. Women must use effective birth control with this medicine. What side effects may I notice from receiving this medicine? Side effects that you should report to your doctor or other health care professional as soon as possible: -breathing difficulties -chest pain or palpitations -cough -dizziness or fainting -fever or chills, sore throat -skin rash, itching or hives -swelling of the legs or ankles -unusually weak or tired Side effects that usually do not require medical attention (report to your doctor or other health care professional if they continue or are bothersome): -loss of appetite -headache -muscle aches -nausea This list may not describe all possible side effects. Call your doctor for medical advice about side effects. You may report side effects to FDA at 1-800-FDA-1088. Where should I keep my medicine? This drug is given in a hospital or clinic and will not be stored at home. NOTE: This sheet is a summary. It may not cover all possible information. If you have questions about this medicine, talk to your doctor, pharmacist, or health care provider.  Pertuzumab injection What is this medicine? PERTUZUMAB (per TOOZ ue mab) is a monoclonal antibody. It is used to treat breast cancer. This medicine may be used for other purposes; ask your health care provider or pharmacist if you have questions. What should I tell my health care provider before I take this medicine? They need to know if you have any of these conditions: -heart disease -heart failure -high blood pressure -history of irregular heart beat -recent or ongoing radiation therapy -an unusual or allergic reaction to pertuzumab, other medicines, foods, dyes, or preservatives -pregnant or trying to get pregnant -breast-feeding How should I use this medicine? This medicine is for  infusion into a vein. It is given by a health care professional in a hospital or clinic setting. Talk to your pediatrician regarding the use of this medicine in children. Special care may be needed. Overdosage: If you think you have taken too much of this medicine contact a poison control center or emergency room at once. NOTE: This medicine is only for you. Do not share this medicine with others. What if I miss a dose? It is important not to miss your dose. Call your doctor or health care professional if you are unable to keep an appointment. What may interact with this medicine? Interactions are not expected. Give your health care provider a list of all the medicines, herbs, non-prescription drugs, or dietary supplements you use. Also tell them if you smoke, drink alcohol, or use illegal drugs. Some items may interact with your medicine. This list may not describe all possible interactions. Give your health care provider a list of all the medicines, herbs, non-prescription drugs, or dietary supplements you use. Also tell them if you smoke, drink alcohol, or use illegal drugs. Some items may interact with your medicine. What should I watch for while using this medicine? Your condition will be monitored carefully while you are receiving this medicine. Report any side effects. Continue your course of treatment even though you feel ill unless your doctor tells you to stop. Do not become pregnant while taking this medicine or for 7 months  after stopping it. Women should inform their doctor if they wish to become pregnant or think they might be pregnant. Women of child-bearing potential will need to have a negative pregnancy test before starting this medicine. There is a potential for serious side effects to an unborn child. Talk to your health care professional or pharmacist for more information. Do not breast-feed an infant while taking this medicine or for 7 months after stopping it. Women must use  effective birth control with this medicine. Call your doctor or health care professional for advice if you get a fever, chills or sore throat, or other symptoms of a cold or flu. Do not treat yourself. Try to avoid being around people who are sick. You may experience fever, chills, and headache during the infusion. Report any side effects during the infusion to your health care professional. What side effects may I notice from receiving this medicine? Side effects that you should report to your doctor or health care professional as soon as possible: -breathing problems -chest pain or palpitations -dizziness -feeling faint or lightheaded -fever or chills -skin rash, itching or hives -sore throat -swelling of the face, lips, or tongue -swelling of the legs or ankles -unusually weak or tired Side effects that usually do not require medical attention (Report these to your doctor or health care professional if they continue or are bothersome.): -diarrhea -hair loss -nausea, vomiting -tiredness This list may not describe all possible side effects. Call your doctor for medical advice about side effects. You may report side effects to FDA at 1-800-FDA-1088. Where should I keep my medicine? This drug is given in a hospital or clinic and will not be stored at home. NOTE: This sheet is a summary. It may not cover all possible information. If you have questions about this medicine, talk to your doctor, pharmacist, or health care provider.   Docetaxel injection What is this medicine? DOCETAXEL (doe se TAX el) is a chemotherapy drug. It targets fast dividing cells, like cancer cells, and causes these cells to die. This medicine is used to treat many types of cancers like breast cancer, certain stomach cancers, head and neck cancer, lung cancer, and prostate cancer. This medicine may be used for other purposes; ask your health care provider or pharmacist if you have questions. What should I tell my  health care provider before I take this medicine? They need to know if you have any of these conditions: -infection (especially a virus infection such as chickenpox, cold sores, or herpes) -liver disease -low blood counts, like low white cell, platelet, or red cell counts -an unusual or allergic reaction to docetaxel, polysorbate 80, other chemotherapy agents, other medicines, foods, dyes, or preservatives -pregnant or trying to get pregnant -breast-feeding How should I use this medicine? This drug is given as an infusion into a vein. It is administered in a hospital or clinic by a specially trained health care professional. Talk to your pediatrician regarding the use of this medicine in children. Special care may be needed. Overdosage: If you think you have taken too much of this medicine contact a poison control center or emergency room at once. NOTE: This medicine is only for you. Do not share this medicine with others. What if I miss a dose? It is important not to miss your dose. Call your doctor or health care professional if you are unable to keep an appointment. What may interact with this medicine? -cyclosporine -erythromycin -ketoconazole -medicines to increase blood counts like filgrastim, pegfilgrastim,  sargramostim -vaccines Talk to your doctor or health care professional before taking any of these medicines: -acetaminophen -aspirin -ibuprofen -ketoprofen -naproxen This list may not describe all possible interactions. Give your health care provider a list of all the medicines, herbs, non-prescription drugs, or dietary supplements you use. Also tell them if you smoke, drink alcohol, or use illegal drugs. Some items may interact with your medicine. What should I watch for while using this medicine? Your condition will be monitored carefully while you are receiving this medicine. You will need important blood work done while you are taking this medicine. This drug may make you  feel generally unwell. This is not uncommon, as chemotherapy can affect healthy cells as well as cancer cells. Report any side effects. Continue your course of treatment even though you feel ill unless your doctor tells you to stop. In some cases, you may be given additional medicines to help with side effects. Follow all directions for their use. Call your doctor or health care professional for advice if you get a fever, chills or sore throat, or other symptoms of a cold or flu. Do not treat yourself. This drug decreases your body's ability to fight infections. Try to avoid being around people who are sick. This medicine may increase your risk to bruise or bleed. Call your doctor or health care professional if you notice any unusual bleeding. This medicine may contain alcohol in the product. You may get drowsy or dizzy. Do not drive, use machinery, or do anything that needs mental alertness until you know how this medicine affects you. Do not stand or sit up quickly, especially if you are an older patient. This reduces the risk of dizzy or fainting spells. Avoid alcoholic drinks. Do not become pregnant while taking this medicine. Women should inform their doctor if they wish to become pregnant or think they might be pregnant. There is a potential for serious side effects to an unborn child. Talk to your health care professional or pharmacist for more information. Do not breast-feed an infant while taking this medicine. What side effects may I notice from receiving this medicine? Side effects that you should report to your doctor or health care professional as soon as possible: -allergic reactions like skin rash, itching or hives, swelling of the face, lips, or tongue -low blood counts - This drug may decrease the number of white blood cells, red blood cells and platelets. You may be at increased risk for infections and bleeding. -signs of infection - fever or chills, cough, sore throat, pain or difficulty  passing urine -signs of decreased platelets or bleeding - bruising, pinpoint red spots on the skin, black, tarry stools, nosebleeds -signs of decreased red blood cells - unusually weak or tired, fainting spells, lightheadedness -breathing problems -fast or irregular heartbeat -low blood pressure -mouth sores -nausea and vomiting -pain, swelling, redness or irritation at the injection site -pain, tingling, numbness in the hands or feet -swelling of the ankle, feet, hands -weight gain Side effects that usually do not require medical attention (report to your prescriber or health care professional if they continue or are bothersome): -bone pain -complete hair loss including hair on your head, underarms, pubic hair, eyebrows, and eyelashes -diarrhea -excessive tearing -changes in the color of fingernails -loosening of the fingernails -nausea -muscle pain -red flush to skin -sweating -weak or tired This list may not describe all possible side effects. Call your doctor for medical advice about side effects. You may report side effects to FDA  at 1-800-FDA-1088. Where should I keep my medicine? This drug is given in a hospital or clinic and will not be stored at home. NOTE: This sheet is a summary. It may not cover all possible information. If you have questions about this medicine, talk to your doctor, pharmacist, or health care provider.   Carboplatin injection What is this medicine? CARBOPLATIN (KAR boe pla tin) is a chemotherapy drug. It targets fast dividing cells, like cancer cells, and causes these cells to die. This medicine is used to treat ovarian cancer and many other cancers. This medicine may be used for other purposes; ask your health care provider or pharmacist if you have questions. What should I tell my health care provider before I take this medicine? They need to know if you have any of these conditions: -blood disorders -hearing problems -kidney disease -recent or  ongoing radiation therapy -an unusual or allergic reaction to carboplatin, cisplatin, other chemotherapy, other medicines, foods, dyes, or preservatives -pregnant or trying to get pregnant -breast-feeding How should I use this medicine? This drug is usually given as an infusion into a vein. It is administered in a hospital or clinic by a specially trained health care professional. Talk to your pediatrician regarding the use of this medicine in children. Special care may be needed. Overdosage: If you think you have taken too much of this medicine contact a poison control center or emergency room at once. NOTE: This medicine is only for you. Do not share this medicine with others. What if I miss a dose? It is important not to miss a dose. Call your doctor or health care professional if you are unable to keep an appointment. What may interact with this medicine? -medicines for seizures -medicines to increase blood counts like filgrastim, pegfilgrastim, sargramostim -some antibiotics like amikacin, gentamicin, neomycin, streptomycin, tobramycin -vaccines Talk to your doctor or health care professional before taking any of these medicines: -acetaminophen -aspirin -ibuprofen -ketoprofen -naproxen This list may not describe all possible interactions. Give your health care provider a list of all the medicines, herbs, non-prescription drugs, or dietary supplements you use. Also tell them if you smoke, drink alcohol, or use illegal drugs. Some items may interact with your medicine. What should I watch for while using this medicine? Your condition will be monitored carefully while you are receiving this medicine. You will need important blood work done while you are taking this medicine. This drug may make you feel generally unwell. This is not uncommon, as chemotherapy can affect healthy cells as well as cancer cells. Report any side effects. Continue your course of treatment even though you feel ill  unless your doctor tells you to stop. In some cases, you may be given additional medicines to help with side effects. Follow all directions for their use. Call your doctor or health care professional for advice if you get a fever, chills or sore throat, or other symptoms of a cold or flu. Do not treat yourself. This drug decreases your body's ability to fight infections. Try to avoid being around people who are sick. This medicine may increase your risk to bruise or bleed. Call your doctor or health care professional if you notice any unusual bleeding. Be careful brushing and flossing your teeth or using a toothpick because you may get an infection or bleed more easily. If you have any dental work done, tell your dentist you are receiving this medicine. Avoid taking products that contain aspirin, acetaminophen, ibuprofen, naproxen, or ketoprofen unless instructed by  your doctor. These medicines may hide a fever. Do not become pregnant while taking this medicine. Women should inform their doctor if they wish to become pregnant or think they might be pregnant. There is a potential for serious side effects to an unborn child. Talk to your health care professional or pharmacist for more information. Do not breast-feed an infant while taking this medicine. What side effects may I notice from receiving this medicine? Side effects that you should report to your doctor or health care professional as soon as possible: -allergic reactions like skin rash, itching or hives, swelling of the face, lips, or tongue -signs of infection - fever or chills, cough, sore throat, pain or difficulty passing urine -signs of decreased platelets or bleeding - bruising, pinpoint red spots on the skin, black, tarry stools, nosebleeds -signs of decreased red blood cells - unusually weak or tired, fainting spells, lightheadedness -breathing problems -changes in hearing -changes in vision -chest pain -high blood pressure -low  blood counts - This drug may decrease the number of white blood cells, red blood cells and platelets. You may be at increased risk for infections and bleeding. -nausea and vomiting -pain, swelling, redness or irritation at the injection site -pain, tingling, numbness in the hands or feet -problems with balance, talking, walking -trouble passing urine or change in the amount of urine Side effects that usually do not require medical attention (report to your doctor or health care professional if they continue or are bothersome): -hair loss -loss of appetite -metallic taste in the mouth or changes in taste This list may not describe all possible side effects. Call your doctor for medical advice about side effects. You may report side effects to FDA at 1-800-FDA-1088. Where should I keep my medicine? This drug is given in a hospital or clinic and will not be stored at home. NOTE: This sheet is a summary. It may not cover all possible information. If you have questions about this medicine, talk to your doctor, pharmacist, or health care provider.    2016, [employer]. (2007-09-28 14:38:05)

## 2016-01-03 ENCOUNTER — Ambulatory Visit: Payer: BLUE CROSS/BLUE SHIELD

## 2016-01-03 ENCOUNTER — Encounter: Payer: Self-pay | Admitting: *Deleted

## 2016-01-03 NOTE — Progress Notes (Signed)
[facility] Psychosocial Distress Screening Clinical Social Work  Clinical Social Work was referred by distress screening protocol.  The patient scored a 6 on the Psychosocial Distress Thermometer which indicates moderate distress. Clinical Social Worker phoned pt to assess for distress and other psychosocial needs. CSW spoke with pt at length and reviewed in detail role of CSW/Pt and Family Support team. Pt recently moved back from [place], [place] as all of her family is here locally. Pt has been staying with her parents and her husband will be moving up this weekend. They are very worried about financial resources as husband just completed law school and is still looking for employment. 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 They have one son, who is in college. CSW reviewed financial resources to assist such as [employer] [place], [employer], [employer] and [employer]. Pt also plans to reach out to the financial advocates. Pt is also curious about any scholarships for kids whose parents have cancer. CSW will have to research this need and get back with pt.   Pt shared an interest in counseling and additional supports. CSW reviewed these as well. Pt open to support groups, yoga and other activities at the center. Pt reports the xanax and effexor that [facility] have arranged are working well for her. She feels these issues are currently under control. CSW plans to meet with pt after her next MD appt on July 7. CSW will follow pt and assist acoordingly.   [facility] DISTRESS SCREENING 12/19/2015  Screening Type Initial Screening  Distress experienced in past week (1-10) 6  Practical problem type Housing;Work/school;Food  Emotional problem type Nervousness/Anxiety  Physical Problem type Pain  Physician notified of physical symptoms Yes  Referral to clinical social work Yes    Clinical Social Worker follow up needed: Yes.    If yes, follow up plan: See above Loren Racer, [place]  Worker [place]  [facility] Phone: [phone] Fax: [phone]

## 2016-01-04 ENCOUNTER — Telehealth: Payer: Self-pay | Admitting: Genetic Counselor

## 2016-01-04 NOTE — Telephone Encounter (Signed)
Discussed with both [name] and her mother that her genetic test result was negative for known pathogenic mutations within any of the 20 genes on the Breast/Ovarian Cancer Panel through [employer].  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  Discussed that we treat this like a negative result until these changes are updated by the lab in the future.  Encouraged [name] to keep her phone number up to date with us--we'll call her if these are updated.  Reviewed that her paternal aunt can have testing based on her diagnosis of breast cancer in her 29s.  Her sister can begin annual mammogram screening now.  [name] should follow her doctors recommendations for future cancer screening, she will be followed more closely for a time due to her early age of diagnosis.  She is welcome to call myself or Santiago Glad with any questions.  She would like her results mailed, and we will be happy to do that.

## 2016-01-07 ENCOUNTER — Encounter: Payer: Self-pay | Admitting: Genetic Counselor

## 2016-01-07 ENCOUNTER — Ambulatory Visit: Payer: Self-pay | Admitting: Genetic Counselor

## 2016-01-07 DIAGNOSIS — Z803 Family history of malignant neoplasm of breast: Secondary | ICD-10-CM

## 2016-01-07 DIAGNOSIS — Z1379 Encounter for other screening for genetic and chromosomal anomalies: Secondary | ICD-10-CM

## 2016-01-07 DIAGNOSIS — C50412 Malignant neoplasm of upper-outer quadrant of left female breast: Secondary | ICD-10-CM

## 2016-01-07 NOTE — Progress Notes (Signed)
HPI: [name] was previously seen in the [facility] due to a personal and family history of cancer and concerns regarding a hereditary predisposition to cancer. Please refer to our prior cancer genetics clinic note for more information regarding [name]'s medical, social and family histories, and our assessment and recommendations, at the time. [name] recent genetic test results were disclosed to her, as were recommendations warranted by these results. These results and recommendations are discussed in more detail below.  FAMILY HISTORY:  We obtained a detailed, 4-generation family history.  Significant diagnoses are listed below: Family History  Problem Relation Age of Onset  . Anesthesia problems Neg Hx   . Hypotension Neg Hx   . Malignant hyperthermia Neg Hx   . Pseudochol deficiency Neg Hx   . Sarcoidosis Mother   . Hypertension Father   . Throat cancer Maternal Grandfather     smoker and heavy drinker; dx in his late 56s-50s  . Cancer Paternal Grandmother     possible gatric vs bladder cancer  . Bladder Cancer Paternal Grandmother   . Breast cancer Paternal Aunt     dxin her 30s; dad's maternal half sister  . Breast cancer Other     PGFs mother    The patient has one son who is healthy and a sister and brother who are healthy and cacner free. Her parents are both living. Her mother is 96. She has a sister and a maternal half sister who are healthy and cancer free. The patient's maternal grandmother is alive at 62 and her grandfather died of throat cancer. The patient's father is healthy at 51. He has two maternal half brothers and two maternal half sisters. One sister was diagnosed with breast cancer in her 43s and is currently 66. The patient's paternal grandmother had either bladder or gastric cancer in her 65s, and her grandfather had a mother who died of breast cancer in her 34s.Patient's maternal ancestors are of [place] and  [place] descent, and paternal ancestors are of African American descent. There is no reported Ashkenazi Jewish ancestry. There is no known consanguinity.  GENETIC TEST RESULTS: At the time of [name]'s visit, we recommended she pursue genetic testing of the Breast/Ovarian cancer gene panel. The Breast/Ovarian gene panel offered by [facility] includes sequencing and rearrangement analysis for the following 20 genes:  ATM, BARD1, BRCA1, BRCA2, BRIP1, CDH1, CHEK2, EPCAM, FANCC, MLH1, MSH2, MSH6, NBN, PALB2, PMS2, PTEN, RAD51C, RAD51D, TP53, and XRCC2.   The report date is January 03, 2016.  Genetic testing was normal, and did not reveal a deleterious mutation in these genes. The test report has been scanned into EPIC and is located under the Molecular Pathology section of the Results Review tab.   We discussed with [name] that since the current genetic testing is not perfect, it is possible there may be a gene mutation in one of these genes that current testing cannot detect, but that chance is small. We also discussed, that it is possible that another gene that has not yet been discovered, or that we have not yet tested, is responsible for the cancer diagnoses in the family, and it is, therefore, important to remain in touch with cancer genetics in the future so that we can continue to offer [name] the most up to date genetic testing.   Genetic testing did detect two Variants of Unknown Significance, one in the ATM gene called c.[id number] and the other in the PMS2  gene called c.572A>G. At this time, it is unknown if these variants are associated with increased cancer risk or if these are a normal finding, but most variants such as this get reclassified to being inconsequential. They should not be used to make medical management decisions. With time, we suspect the lab will determine the significance of this variant, if any. If we do learn more about it, we will try to contact [name] to  discuss it further. However, it is important to stay in touch with [place] periodically and keep the address and phone number up to date.   CANCER SCREENING RECOMMENDATIONS:  Given [name]'s personal and family histories, we must interpret these negative results with some caution.  Families with features suggestive of hereditary risk for cancer tend to have multiple family members with cancer, diagnoses in multiple generations and diagnoses before the age of 30. [name] family exhibits some of these features. Thus this result may simply reflect our current inability to detect all mutations within these genes or there may be a different gene that has not yet been discovered or tested.   RECOMMENDATIONS FOR FAMILY MEMBERS: Women in this family might be at some increased risk of developing cancer, over the general population risk, simply due to the family history of cancer. We recommended women in this family have a yearly mammogram beginning at age 64, or 64 years younger than the earliest onset of cancer, an an annual clinical breast exam, and perform monthly breast self-exams. Women in this family should also have a gynecological exam as recommended by their primary provider. All family members should have a colonoscopy by age 50.  Based on [name]'s family history, we recommended her paternal aunt, who was diagnosed with breast cancer in her 50s, have genetic counseling and testing. [name] will let us know if we can be of any assistance in coordinating genetic counseling and/or testing for this family member.   FOLLOW-UP: Lastly, we discussed with [name] that cancer genetics is a rapidly advancing field and it is possible that new genetic tests will be appropriate for her and/or her family members in the future. We encouraged her to remain in contact with cancer genetics on an annual basis so we can update her personal and family histories and let her know of advances in cancer  genetics that may benefit this family.   Our contact number was provided. [name] questions were answered to her satisfaction, and she knows she is welcome to call us at anytime with additional questions or concerns.   Jordan Kayser, MS, [facility] Certified Genetic Counselor [email or web address]

## 2016-01-10 ENCOUNTER — Telehealth: Payer: Self-pay | Admitting: *Deleted

## 2016-01-10 ENCOUNTER — Other Ambulatory Visit: Payer: Self-pay | Admitting: *Deleted

## 2016-01-10 DIAGNOSIS — C50412 Malignant neoplasm of upper-outer quadrant of left female breast: Secondary | ICD-10-CM

## 2016-01-10 NOTE — Telephone Encounter (Signed)
This RN obtained faxed communication from [facility] per call from pt's mother at 9:58 pm 7/5 due to diarrhea with blood in stool. Per note and further questioning- blood is noted with wiping with no documentation of frank blood from rectum.  This RN attempted to contact pt and obtained identified VM- message left requesting a return call - note number as mobile number.  Per call to home number - which is her mother's number - contact was made with mother. She states she is not with pt at this time " but I believe she is doing much better ".  This RN informed mother that call has been placed to [place]'s mobile number with request for a return call. Request call to follow up on status of control of diarrhea and any further bleeding.  If mother speaks with pt - she will ask her to call.

## 2016-01-11 ENCOUNTER — Ambulatory Visit (HOSPITAL_BASED_OUTPATIENT_CLINIC_OR_DEPARTMENT_OTHER): Payer: BLUE CROSS/BLUE SHIELD | Admitting: Oncology

## 2016-01-11 ENCOUNTER — Encounter: Payer: Self-pay | Admitting: *Deleted

## 2016-01-11 ENCOUNTER — Other Ambulatory Visit (HOSPITAL_BASED_OUTPATIENT_CLINIC_OR_DEPARTMENT_OTHER): Payer: BLUE CROSS/BLUE SHIELD

## 2016-01-11 VITALS — BP 130/80 | HR 94 | Temp 98.4°F | Resp 18 | Ht 64.0 in | Wt 181.2 lb

## 2016-01-11 DIAGNOSIS — R197 Diarrhea, unspecified: Secondary | ICD-10-CM

## 2016-01-11 DIAGNOSIS — C50412 Malignant neoplasm of upper-outer quadrant of left female breast: Secondary | ICD-10-CM

## 2016-01-11 DIAGNOSIS — Z170 Estrogen receptor positive status [ER+]: Secondary | ICD-10-CM

## 2016-01-11 LAB — CBC WITH DIFFERENTIAL/PLATELET
BASO%: 0.3 % (ref 0.0–2.0)
Basophils Absolute: 0.1 10*3/uL (ref 0.0–0.1)
EOS%: 0.3 % (ref 0.0–7.0)
Eosinophils Absolute: 0.1 10*3/uL (ref 0.0–0.5)
HCT: 32.5 % — ABNORMAL LOW (ref 34.8–46.6)
HGB: 11.2 g/dL — ABNORMAL LOW (ref 11.6–15.9)
LYMPH%: 13.8 % — ABNORMAL LOW (ref 14.0–49.7)
MCH: 29.6 pg (ref 25.1–34.0)
MCHC: 34.5 g/dL (ref 31.5–36.0)
MCV: 85.8 fL (ref 79.5–101.0)
MONO#: 2.8 10*3/uL — ABNORMAL HIGH (ref 0.1–0.9)
MONO%: 11.3 % (ref 0.0–14.0)
NEUT#: 18.4 10*3/uL — ABNORMAL HIGH (ref 1.5–6.5)
NEUT%: 74.3 % (ref 38.4–76.8)
Platelets: 230 10*3/uL (ref 145–400)
RBC: 3.79 10*6/uL (ref 3.70–5.45)
RDW: 13.1 % (ref 11.2–14.5)
WBC: 24.9 10*3/uL — ABNORMAL HIGH (ref 3.9–10.3)
lymph#: 3.4 10*3/uL — ABNORMAL HIGH (ref 0.9–3.3)

## 2016-01-11 LAB — COMPREHENSIVE METABOLIC PANEL
ALT: 113 U/L — ABNORMAL HIGH (ref 0–55)
AST: 42 U/L — ABNORMAL HIGH (ref 5–34)
Albumin: 3.4 g/dL — ABNORMAL LOW (ref 3.5–5.0)
Alkaline Phosphatase: 99 U/L (ref 40–150)
Anion Gap: 9 mEq/L (ref 3–11)
BUN: 11.9 mg/dL (ref 7.0–26.0)
CO2: 26 mEq/L (ref 22–29)
Calcium: 8.9 mg/dL (ref 8.4–10.4)
Chloride: 106 mEq/L (ref 98–109)
Creatinine: 0.8 mg/dL (ref 0.6–1.1)
EGFR: >90 mL/min/{1.73_m2} (ref >90)
Glucose: 89 mg/dl (ref 70–140)
Potassium: 3.8 mEq/L (ref 3.5–5.1)
Sodium: 141 mEq/L (ref 136–145)
Total Bilirubin: <0.3 mg/dL (ref 0.20–1.20)
Total Protein: 6.6 g/dL (ref 6.4–8.3)

## 2016-01-11 LAB — MAGNESIUM: Magnesium: 1.6 mg/dl (ref 1.5–2.5)

## 2016-01-11 MED ORDER — VALACYCLOVIR HCL 500 MG PO TABS: 500.0000 mg | ORAL_TABLET | Freq: Every day | ORAL | Status: DC

## 2016-01-11 MED ORDER — DOXYCYCLINE HYCLATE 100 MG PO TABS: 100.0000 mg | ORAL_TABLET | Freq: Every day | ORAL | Status: DC

## 2016-01-11 MED ORDER — CYCLOSPORINE 0.05 % OP EMUL: 1.0000 [drp] | Freq: Two times a day (BID) | OPHTHALMIC | Status: DC

## 2016-01-11 NOTE — Clinical Social Work Note (Signed)
[place] Work  Holiday representative met with or patient and her husband in [place] office to offer support and assess for needs.  Pt recently moved from [place] in order to have help from family during her cancer treatment. Both pt and her husband have extended family here locally. CSW reviewed various support services available to assist her. Pt received massage vouchers to receive her two massages at the [facility]. CSW reviewed financial assistance organizations and provided her with applications for [employer] [place], [employer] and [employer]. Pt aware to also contact her financial advocate. Pt very interested in counseling and CSW to make referral as well. Pt to gather needed documents for applications and contact CSW when ready to proceed with assistance.   Clinical Social Work interventions: Resource education and referral Emotional support   Loren Racer, [place]  [place] Phone: [phone] Fax: [phone]

## 2016-01-11 NOTE — Progress Notes (Signed)
[place]  [phone] [phone]     ID: [name] DOB: [birth year]  MR#: [id number]  YQM#:[id number]  Patient Care Team: No Pcp Per Patient as PCP - General (General Practice) Jordan Cruel, MD as Consulting Physician (Oncology) Jordan Bookbinder, MD as Consulting Physician (General Surgery) Jordan Dom, MD as Consulting Physician (Obstetrics and Gynecology) PCP: No PCP Per Patient OTHER MD:  CHIEF COMPLAINT: Estrogen receptor positive breast cancer  CURRENT TREATMENT: neoadjuvant chemotherapy and anti-HER-2 immunotherapy   BREAST CANCER HISTORY: From the original intake note:  [name] woke up the morning of 11/09/2015 with some pain in her left axilla. She examined herself and found a lump in her left breast. She saw her gynecologist the same day, and she confirms a lump. The patient then proceeded directly to mammography. I do not have that report. However it did show the mass. [name] was then scheduled for ultrasound-guided biopsy 11/21/2015. This showed ([place] [phone] at the [facility] school [facility] medicine) and invasive ductal carcinoma, grade 2 estrogen receptor positive, with moderate intensity (10-50%), progesterone receptor negative, and HER-2 equivocal by immunohistochemistry. Fish was obtained and was equivocal as well, with a signals ratio of 1.8 to, the number per cell being 5.7.  On 12/05/2015 the patient had a CT/ angiogram of the chest for evaluation of her left-sided chest pain. This showed no evidence of a clot. The left breast nodule measured 1.3 cm on the study. There was no enlarged axillary adenopathy. There was also no evidence of lung metastasis or blastic or destructive lytic bone lesions. The upper abdomen was unremarkable.  With that information the patient presents for further evaluation and treatment.  INTERVAL HISTORY: [name] returns today for follow-up of her triple positive breast cancer  accompanied by her husband [name]. Today is day 11 cycle 1 of 6 planned doses of carboplatin, docetaxel, trastuzumab and pertuzumab given every 21 days, to be followed by trastuzumab alone for one year.  REVIEW OF SYSTEMS: [name] . She had no problems on days 1 or 2 but in the evening of day 3, after going off the steroids, she did have some nausea and vomited 1 time. Around that day she also began to develop diarrhea, up to 4 times a day, pretty loose to watery. This is causing some hemorrhoidal discomfort. She is developing some acne. She has some sores on her tongue. Her eyes are very dry and she doesn't have glasses so she has to wear her contacts, which is uncomfortable. Her hair is shedding in a major way. She had significant bony pain from the OnPro. She took tramadol for this and it was helpful. Her appetite went down but is now back to normal. What she has not changed is the altered taste. She had a panic attack a few days ago, but she "can't herself down" with breathing exercises and she does feel the venlafaxine she is taking is helping. Aside from these issues a detailed review of systems today was stable    PAST MEDICAL HISTORY: Past Medical History  Diagnosis Date  . Endometriosis   . Depression   . Anxiety   . Hormone disorder   . Herniated disc, cervical   . Neck pain   . Migraine without aura   . Breast cancer ([place]) 11/20/15    left breast  . Family history of breast cancer   . Allergy   . IBD (inflammatory bowel disease)   . PONV (postoperative nausea and vomiting)   .  Shortness of breath dyspnea     anxiety   . History of bronchitis as a child   . Asthma     childhood   . History of urinary tract infection   . GERD (gastroesophageal reflux disease)   . History of stomach ulcers     PAST SURGICAL HISTORY: Past Surgical History  Procedure Laterality Date  . Abdominal hysterectomy      Still has ovaries  . Portacath placement Right 12/25/2015    Procedure: INSERTION  PORT-A-CATH WITH ULTRA SOUND;  Surgeon: Jordan Bookbinder, MD;  Location: [facility];  Service: General;  Laterality: Right;    FAMILY HISTORY Family History  Problem Relation Age of Onset  . Anesthesia problems Neg Hx   . Hypotension Neg Hx   . Malignant hyperthermia Neg Hx   . Pseudochol deficiency Neg Hx   . Sarcoidosis Mother   . Hypertension Father   . Throat cancer Maternal Grandfather     smoker and heavy drinker; dx in his late 14s-50s  . Cancer Paternal Grandmother     possible gatric vs bladder cancer  . Bladder Cancer Paternal Grandmother   . Breast cancer Paternal Aunt     dxin her 62s; dad's maternal half sister  . Breast cancer Other     PGFs mother  The patient's parents are both living, in their early 30s. The patient has one brother and one sister. On the father's side 1 great grandmother was diagnosed with breast cancer in her 56s. The patient's father's mother was diagnosed with stomach cancer. One of the patient's father's sisters was diagnosed with breast cancer in her 1s. On the maternal side patient's mother's father was diagnosed with throat cancer at age.   GYNECOLOGIC HISTORY:  No LMP recorded. Patient has had a hysterectomy.  menarche age 25, first live birth age 27. The patient is GX P1. She stopped having periods in 2008, when she underwent a simple hysterectomy without salpingo-oophorectomy. She has a history of endometriosis and was started on Provera in March of this year, with her next dose due next week. (However she has decided to forego further Provera treatments at least for now).   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 He is licensed in [place] but not in [place]. He is now in this area looking for a job. The patient's son [name] is a Ship broker in music at [place]: Not in place   HEALTH MAINTENANCE: Social History  Substance Use Topics  . Smoking status: Never Smoker   . Smokeless tobacco: Never Used  . Alcohol Use: 0.0 oz/week    0 Standard drinks or equivalent per week     Comment: socially      Colonoscopy:  PAP:  Bone density:  Lipid panel:  Allergies  Allergen Reactions  . Amoxicillin Shortness Of Breath and Itching    Has patient had a PCN reaction causing immediate rash, facial/tongue/throat swelling, SOB or lightheadedness with hypotension: Yes Has patient had a PCN reaction causing severe rash involving mucus membranes or skin necrosis: No Has patient had a PCN reaction that required hospitalization Yes Has patient had a PCN reaction occurring within the last 10 years: No If all of the above answers are "NO", then may proceed with Cephalosporin use.     Current Outpatient  Prescriptions  Medication Sig Dispense Refill  . cycloSPORINE (RESTASIS) 0.05 % ophthalmic emulsion Place 1 drop into both eyes 2 (two) times daily. 5.5 mL 1  . dexamethasone (DECADRON) 4 MG tablet Take 2 tablets (8 mg total) by mouth 2 (two) times daily. Start the day before Taxotere. Then again the day after chemo for 3 days. 30 tablet 1  . doxycycline (VIBRA-TABS) 100 MG tablet Take 1 tablet (100 mg total) by mouth daily. 60 tablet 1  . lidocaine-prilocaine (EMLA) cream Apply to affected area once 30 g 3  . LORazepam (ATIVAN) 0.5 MG tablet Take 1 tablet (0.5 mg total) by mouth at bedtime as needed (Nausea or vomiting). 30 tablet 0  . prochlorperazine (COMPAZINE) 10 MG tablet Take 1 tablet (10 mg total) by mouth every 6 (six) hours as needed (Nausea or vomiting). 30 tablet 1  . traMADol (ULTRAM) 50 MG tablet Take 1 tablet (50 mg total) by mouth every 6 (six) hours as needed for moderate pain. 60 tablet 1  . valACYclovir (VALTREX) 500 MG tablet Take 1 tablet (500 mg total) by mouth daily. 60 tablet 1  . venlafaxine XR (EFFEXOR-XR) 75 MG 24 hr  capsule Take 1 capsule (75 mg total) by mouth daily with breakfast. 90 capsule 4  . zolpidem (AMBIEN) 10 MG tablet Take 1/2 to 1 tab po qhs prn (Patient taking differently: Take 5-10 mg by mouth at bedtime as needed for sleep. Take 1/2 to 1 tab po qhs prn) 30 tablet 0   No current facility-administered medications for this visit.    OBJECTIVE: Early middle-aged African-American woman Who appears stated age 6 Vitals:   01/11/16 1053  BP: 130/80  Pulse: 94  Temp: 98.4 F (36.9 C)  Resp: 18     Body mass index is 31.09 kg/(m^2).    ECOG FS:1 - Symptomatic but completely ambulatory  Sclerae unicteric, EOMs intact Oropharynx clear and moist No cervical or supraclavicular adenopathy Lungs no rales or rhonchi Heart regular rate and rhythm Abd soft, nontender, positive bowel sounds MSK no focal spinal tenderness, no upper extremity lymphedema Neuro: nonfocal, well oriented, appropriate affect Breasts: The right breast is unremarkable. In the left breast upper outer quadrant, laterally, the hard 1-1/2 cm movable nontender mass previously felt is unchanged. The left axilla is benign    LAB RESULTS:  CMP     Component Value Date/Time   NA 141 01/11/2016 1041   NA 138 12/04/2015 1858   K 3.8 01/11/2016 1041   K 3.9 12/04/2015 1858   CL 107 12/04/2015 1858   CO2 26 01/11/2016 1041   CO2 25 12/04/2015 1858   GLUCOSE 89 01/11/2016 1041   GLUCOSE 89 12/04/2015 1858   BUN 11.9 01/11/2016 1041   BUN 13 12/04/2015 1858   CREATININE 0.8 01/11/2016 1041   CREATININE 0.78 12/04/2015 1858   CALCIUM 8.9 01/11/2016 1041   CALCIUM 9.7 12/04/2015 1858   PROT 6.6 01/11/2016 1041   PROT 7.6 05/17/2015 1215   ALBUMIN 3.4* 01/11/2016 1041   ALBUMIN 4.5 05/17/2015 1215   AST 42* 01/11/2016 1041   AST 20 05/17/2015 1215   ALT 113* 01/11/2016 1041   ALT 20 05/17/2015 1215   ALKPHOS 99 01/11/2016 1041   ALKPHOS 51 05/17/2015 1215   BILITOT <0.30 01/11/2016 1041   BILITOT 0.6 05/17/2015  1215   GFRNONAA >60 12/04/2015 1858   GFRAA >60 12/04/2015 1858    INo results found for: SPEP, UPEP  Lab Results  Component Value Date  WBC 24.9* 01/11/2016   NEUTROABS 18.4* 01/11/2016   HGB 11.2* 01/11/2016   HCT 32.5* 01/11/2016   MCV 85.8 01/11/2016   PLT 230 01/11/2016      Chemistry      Component Value Date/Time   NA 141 01/11/2016 1041   NA 138 12/04/2015 1858   K 3.8 01/11/2016 1041   K 3.9 12/04/2015 1858   CL 107 12/04/2015 1858   CO2 26 01/11/2016 1041   CO2 25 12/04/2015 1858   BUN 11.9 01/11/2016 1041   BUN 13 12/04/2015 1858   CREATININE 0.8 01/11/2016 1041   CREATININE 0.78 12/04/2015 1858      Component Value Date/Time   CALCIUM 8.9 01/11/2016 1041   CALCIUM 9.7 12/04/2015 1858   ALKPHOS 99 01/11/2016 1041   ALKPHOS 51 05/17/2015 1215   AST 42* 01/11/2016 1041   AST 20 05/17/2015 1215   ALT 113* 01/11/2016 1041   ALT 20 05/17/2015 1215   BILITOT <0.30 01/11/2016 1041   BILITOT 0.6 05/17/2015 1215       No results found for: LABCA2  No components found for: LABCA125  No results for input(s): INR in the last 168 hours.  Urinalysis    Component Value Date/Time   COLORURINE STRAW* 12/11/2008 1923   APPEARANCEUR CLEAR 12/11/2008 1923   LABSPEC 1.020 12/11/2008 1923   PHURINE 6.0 12/11/2008 1923   GLUCOSEU NEGATIVE 12/11/2008 1923   HGBUR NEGATIVE 12/11/2008 1923   BILIRUBINUR NEGATIVE 12/11/2008 1923   KETONESUR NEGATIVE 12/11/2008 1923   PROTEINUR NEGATIVE 12/11/2008 1923   UROBILINOGEN 0.2 12/11/2008 1923   NITRITE NEGATIVE 12/11/2008 1923   LEUKOCYTESUR  12/11/2008 1923    NEGATIVE MICROSCOPIC NOT DONE ON URINES WITH NEGATIVE PROTEIN, BLOOD, LEUKOCYTES, NITRITE, OR GLUCOSE <1000 mg/dL.      ELIGIBLE FOR AVAILABLE RESEARCH PROTOCOL: no  STUDIES: Mr Breast Bilateral W Wo Contrast  12/18/2015  CLINICAL DATA:  Recently diagnosed left breast grade 2 estrogen receptor positive invasive ductal carcinoma. The diagnosis was made at  the [facility]. Patient is referred for pre treatment breast MRI. LABS:  Not applicable EXAM: BILATERAL BREAST MRI WITH AND WITHOUT CONTRAST TECHNIQUE: Multiplanar, multisequence MR images of both breasts were obtained prior to and following the intravenous administration of 17 ml of MultiHance. THREE-DIMENSIONAL MR IMAGE RENDERING ON INDEPENDENT WORKSTATION: Three-dimensional MR images were rendered by post-processing of the original MR data on an independent workstation. The three-dimensional MR images were interpreted, and findings are reported in the following complete MRI report for this study. Three dimensional images were evaluated at the independent DynaCad workstation COMPARISON:  Mammograms from [place] dated 12/12/2015 as well as left breast ultrasound from [facility] imaging dated 12/12/2015. The mammograms and ultrasound from the [facility] are not available for comparison at this time. FINDINGS: Breast composition: b. Scattered fibroglandular tissue. Background parenchymal enhancement: Moderate. Right breast: No mass or abnormal enhancement. Left breast: There is an irregular enhancing mass located within the posterior 1/3 of the upper-outer quadrant of the left breast. The mass is associated with washout enhancement kinetics. There is a signal void centrally located within the mass related to the patient's recent image guided biopsy. The mass measures 2.5 x 2.2 x 1.8 cm in size. Non mass enhancement extends 1.5 cm anteriorly and also 2.0 cm posteriorly. The combination of the non mass enhancement and mass spans 5.3 cm in the AP dimension. There are no additional areas of worrisome enhancement within the left breast. Lymph nodes: No abnormal  appearing lymph nodes. Ancillary findings:  None. IMPRESSION: 2.5 cm irregular enhancing mass located within the posterior 1/3 of the upper-outer quadrant of the left breast corresponding to the known biopsy-proven malignancy. Non mass  enhancement also extends anteriorly and posteriorly from the mass and in aggregate the enhancement associated with the mass and the non mass enhancement span 5.3 cm in the AP dimension. RECOMMENDATION: Treatment plan. BI-RADS CATEGORY  6: Known biopsy-proven malignancy. Electronically Signed   By: Altamese Cabal M.D.   On: 12/18/2015 10:21   Dg Chest Port 1 View  12/25/2015  CLINICAL DATA:  Status post Port-A-Cath placement on the right today. EXAM: PORTABLE CHEST 1 VIEW COMPARISON:  PA and lateral chest 12/04/2015.  CT chest 12/05/2015. FINDINGS: New right IJ approach Port-A-Cath is in place with the tip projecting in the mid superior vena cava. Tubing is intact. Lungs are clear. No pneumothorax. Heart size normal. No focal bony abnormality. IMPRESSION: Tip of Port-A-Cath projects in the mid superior vena cava. Negative for pneumothorax or acute disease. Electronically Signed   By: Inge Rise M.D.   On: 12/25/2015 09:08   Dg C-arm 1-60 Min-no Report  12/25/2015  CLINICAL DATA: surgery C-ARM 1-60 MINUTES Fluoroscopy was utilized by the requesting physician.  No radiographic interpretation.    ASSESSMENT: 41 y.o. [place] woman status post left breast upper outer quadrant biopsy 11/20/2015 for a clinical T1c N0, stage IA invasive ductal carcinoma, grade 2, estrogen receptor moderately positive, progesterone receptor negative, HER-2 equivocal by both immunohistochemistry and FISH  (1) neoadjuvant tamoxifen started 12/11/2015, stopped at the start of chemotherapy  (2) genetics testing 01/03/2016 through the Breast/Ovarian gene panel offered by [facility] found no deleterious mutations in ATM, BARD1, BRCA1, BRCA2, BRIP1, CDH1, CHEK2, EPCAM, FANCC, MLH1, MSH2, MSH6, NBN, PALB2, PMS2, PTEN, RAD51C, RAD51D, TP53, and XRCC2  (3) neoadjuvant chemotherapy consisting of carboplatin, docetaxel, trastuzumab and pertuzumab every 21 days 6 starting 01/01/2016  (4) definitive surgery to follow  (5)  adjuvant radiation to follow surgery   (6) anti-estrogens to be continued 5-10 years, more likely the latter    PLAN: [name] did well with her first cycle of chemotherapy. In particular her counts today are very favorable.  She did have some problems that require intervention. First of all as far as her nausea and vomiting is concerned I think we need to continue the dexamethasone through day 3 instead of stopping the morning of day 3.  As far as the mouth soreness I am starting her on Valtrex 500 mg daily.  She is also developing acne. I am starting her on doxycycline 100 mg daily.  Finally as far as her dry eyes is concern I am starting her on Restasis eyedrops. Note that she does not have glasses and needs to continue to use her contacts.  She is managing the diarrhea well on Imodium. She understands if things get worse we may have to drop the dose, switch to or add Lomotil, or simply eliminate pertuzumab. She is also developing problems with hemorrhoids. She is going to deal with with over-the-counter local treatments.  Otherwise she is doing remarkably well and though she did have an anxiety attack this weekend she tells me the venlafaxine she is receiving is significantly helping.  She is going to return August 8 for the second cycle. The plan continues to be 2 proceed to a total of 6 cycles and then definitive surgery.  She knows to call for any problems that may develop before her next visit  here. Jordan Cruel, MD   01/11/2016 12:00 PM Medical Oncology and Hematology [facility] [address] [place], [place] [address] Tel. [phone]    Fax. [phone]

## 2016-01-14 ENCOUNTER — Telehealth: Payer: Self-pay | Admitting: *Deleted

## 2016-01-14 MED ORDER — VENLAFAXINE HCL 37.5 MG PO TABS: 37.5000 mg | ORAL_TABLET | Freq: Two times a day (BID) | ORAL | Status: DC

## 2016-01-14 NOTE — Telephone Encounter (Signed)
Pt called to this RN to state she has developed unwanted side effects on effexor 75 mg-  [name] states she had developed " hallucinations "- " I am seeing people walk in my room at night that are not there - and then hear talking and music that my son verifies is not occuring"  She does have anxiety when above occurs " which is why I was taking the effexor "  Per call pt is on 75 mg effexor- started 12/28/2015.  Plan is 37.5 mg will be called in for pt to take nightly x 1 week then 1 every other week and then may stop.  [name] has ativan in the home if needed for breakthrough anxiety.  Pt is schedule for MD follow up 7/18 and above can be discussed further for other medical interventions

## 2016-01-14 NOTE — Telephone Encounter (Signed)
TC from patient's mother to inform us of change in preferred pharmacy.  They now are using [facility] on [address]. Change made in system.

## 2016-01-15 ENCOUNTER — Encounter: Payer: Self-pay | Admitting: General Practice

## 2016-01-15 NOTE — Progress Notes (Signed)
Telephone  Counselor called and left voicemail to try and schedule the first counseling appointment. Counselor gave the client some times and days that the client can call to schedule. Counselor will wait to hear back from client, about a time that works best.  Jordan Papa, MS, [place], LPCA Counseling Intern-Department for Spiritual Care and [address] [place], [place]

## 2016-01-17 ENCOUNTER — Encounter: Payer: Self-pay | Admitting: *Deleted

## 2016-01-17 ENCOUNTER — Telehealth: Payer: Self-pay | Admitting: *Deleted

## 2016-01-17 ENCOUNTER — Encounter: Payer: Self-pay | Admitting: Oncology

## 2016-01-17 NOTE — Progress Notes (Signed)
Val sent mess for prior auth for restasis-bcbs

## 2016-01-17 NOTE — Telephone Encounter (Signed)
This RN spoke with pt's mother per her call with concerns she has noticed in [place] stating " she is just not doing well emotionally ".  Jeanett Schlein states Maleeyah " shaved her head last night and that really affected her more then she thought it would "  Per discussion Jeanett Schlein denies pt having any suicidal ideation today but stated Raneesha has in her past had suicidal issues in her past including admission to [facility].  Note pt had unwanted side effects when she was on effexor which she has now been tapered off- and is using ativan " which helps her sleep very well "  Jeanett Schlein is asking about how to go about obtaining resources for pt for above including " pyschotherapy which she has had in the past ", she would like pt to speak with someone tomorrow " before the weekend ".  This RN informed Jeanett Schlein her concerns will be discussed with our Social Work department for appropriate follow up and resources.  Jeanett Schlein appreciated above discussion- no further needs or questions at this time.  This RN called to SW and spoke with Abby Potash- she will follow up with pt per above including having someone speak with pt tomorrow per request.

## 2016-01-17 NOTE — Progress Notes (Signed)
[place] Work  Clinical Social Work phoned pt to set up counseling appt to assess needs due to depression. CSW to see pt at [place], [place] Worker [place]  [facility] Phone: [phone] Fax: [phone]

## 2016-01-18 ENCOUNTER — Encounter: Payer: Self-pay | Admitting: *Deleted

## 2016-01-18 NOTE — Progress Notes (Signed)
[place] Work  Holiday representative had planned counseling session for patient as requested to offer support and assess for needs.  Pt did not show for appointment and CSW phoned to reschedule. Pt reports she was not feeling well due to not sleeping the night before. Pt returns to [facility] on 7/18 and CSW staff will check in at treatment. Counseling intern has also reached out to pt in attempt to schedule counseling session. CSW team to continue to follow and provide support.     Loren Racer, [place] Worker [place]  [place] Phone: [phone] Fax: [phone]

## 2016-01-19 ENCOUNTER — Encounter (HOSPITAL_COMMUNITY): Payer: Self-pay

## 2016-01-21 ENCOUNTER — Other Ambulatory Visit: Payer: Self-pay | Admitting: *Deleted

## 2016-01-21 DIAGNOSIS — C50412 Malignant neoplasm of upper-outer quadrant of left female breast: Secondary | ICD-10-CM

## 2016-01-22 ENCOUNTER — Encounter: Payer: Self-pay | Admitting: *Deleted

## 2016-01-22 ENCOUNTER — Other Ambulatory Visit: Payer: Self-pay | Admitting: *Deleted

## 2016-01-22 ENCOUNTER — Ambulatory Visit: Payer: BLUE CROSS/BLUE SHIELD

## 2016-01-22 ENCOUNTER — Ambulatory Visit (HOSPITAL_BASED_OUTPATIENT_CLINIC_OR_DEPARTMENT_OTHER): Payer: BLUE CROSS/BLUE SHIELD | Admitting: Oncology

## 2016-01-22 ENCOUNTER — Encounter: Payer: Self-pay | Admitting: General Practice

## 2016-01-22 ENCOUNTER — Ambulatory Visit (HOSPITAL_BASED_OUTPATIENT_CLINIC_OR_DEPARTMENT_OTHER): Payer: BLUE CROSS/BLUE SHIELD

## 2016-01-22 ENCOUNTER — Other Ambulatory Visit (HOSPITAL_BASED_OUTPATIENT_CLINIC_OR_DEPARTMENT_OTHER): Payer: BLUE CROSS/BLUE SHIELD

## 2016-01-22 VITALS — HR 88

## 2016-01-22 VITALS — BP 161/86 | HR 108 | Temp 98.4°F | Resp 18 | Ht 64.0 in | Wt 183.9 lb

## 2016-01-22 DIAGNOSIS — Z5112 Encounter for antineoplastic immunotherapy: Secondary | ICD-10-CM | POA: Diagnosis not present

## 2016-01-22 DIAGNOSIS — C50412 Malignant neoplasm of upper-outer quadrant of left female breast: Secondary | ICD-10-CM | POA: Diagnosis not present

## 2016-01-22 DIAGNOSIS — Z5111 Encounter for antineoplastic chemotherapy: Secondary | ICD-10-CM | POA: Diagnosis not present

## 2016-01-22 DIAGNOSIS — Z170 Estrogen receptor positive status [ER+]: Secondary | ICD-10-CM | POA: Diagnosis not present

## 2016-01-22 DIAGNOSIS — Z5189 Encounter for other specified aftercare: Secondary | ICD-10-CM | POA: Diagnosis not present

## 2016-01-22 LAB — CBC WITH DIFFERENTIAL/PLATELET
BASO%: 0.1 % (ref 0.0–2.0)
Basophils Absolute: 0 10*3/uL (ref 0.0–0.1)
EOS%: 0 % (ref 0.0–7.0)
Eosinophils Absolute: 0 10*3/uL (ref 0.0–0.5)
HCT: 32.5 % — ABNORMAL LOW (ref 34.8–46.6)
HGB: 10.7 g/dL — ABNORMAL LOW (ref 11.6–15.9)
LYMPH%: 7.7 % — ABNORMAL LOW (ref 14.0–49.7)
MCH: 28.8 pg (ref 25.1–34.0)
MCHC: 33.1 g/dL (ref 31.5–36.0)
MCV: 87.2 fL (ref 79.5–101.0)
MONO#: 0.9 10*3/uL (ref 0.1–0.9)
MONO%: 5.8 % (ref 0.0–14.0)
NEUT#: 13.5 10*3/uL — ABNORMAL HIGH (ref 1.5–6.5)
NEUT%: 86.4 % — ABNORMAL HIGH (ref 38.4–76.8)
Platelets: 273 10*3/uL (ref 145–400)
RBC: 3.72 10*6/uL (ref 3.70–5.45)
RDW: 13.5 % (ref 11.2–14.5)
WBC: 15.6 10*3/uL — ABNORMAL HIGH (ref 3.9–10.3)
lymph#: 1.2 10*3/uL (ref 0.9–3.3)

## 2016-01-22 LAB — COMPREHENSIVE METABOLIC PANEL
ALT: 27 U/L (ref 0–55)
AST: 18 U/L (ref 5–34)
Albumin: 3.7 g/dL (ref 3.5–5.0)
Alkaline Phosphatase: 70 U/L (ref 40–150)
Anion Gap: 11 mEq/L (ref 3–11)
BUN: 10.9 mg/dL (ref 7.0–26.0)
CO2: 22 mEq/L (ref 22–29)
Calcium: 9.7 mg/dL (ref 8.4–10.4)
Chloride: 108 mEq/L (ref 98–109)
Creatinine: 0.8 mg/dL (ref 0.6–1.1)
EGFR: >90 mL/min/{1.73_m2} (ref >90)
Glucose: 146 mg/dl — ABNORMAL HIGH (ref 70–140)
Potassium: 3.9 mEq/L (ref 3.5–5.1)
Sodium: 141 mEq/L (ref 136–145)
Total Bilirubin: 0.34 mg/dL (ref 0.20–1.20)
Total Protein: 7.3 g/dL (ref 6.4–8.3)

## 2016-01-22 MED ORDER — SODIUM CHLORIDE 0.9 % IV SOLN
75.0000 mg/m2 | Freq: Once | INTRAVENOUS | Status: AC
  Administered 2016-01-22: 140 mg via INTRAVENOUS
  Filled 2016-01-22: qty 14

## 2016-01-22 MED ORDER — ACETAMINOPHEN 325 MG PO TABS
ORAL_TABLET | ORAL | Status: AC
  Filled 2016-01-22: qty 2

## 2016-01-22 MED ORDER — TRASTUZUMAB CHEMO 150 MG IV SOLR
6.0000 mg/kg | Freq: Once | INTRAVENOUS | Status: AC
  Administered 2016-01-22: 504 mg via INTRAVENOUS
  Filled 2016-01-22: qty 24

## 2016-01-22 MED ORDER — DIPHENHYDRAMINE HCL 25 MG PO CAPS
25.0000 mg | ORAL_CAPSULE | Freq: Once | ORAL | Status: AC
  Administered 2016-01-22: 25 mg via ORAL

## 2016-01-22 MED ORDER — SODIUM CHLORIDE 0.9 % IV SOLN
420.0000 mg | Freq: Once | INTRAVENOUS | Status: AC
  Administered 2016-01-22: 420 mg via INTRAVENOUS
  Filled 2016-01-22: qty 14

## 2016-01-22 MED ORDER — DIPHENHYDRAMINE HCL 25 MG PO CAPS
ORAL_CAPSULE | ORAL | Status: AC
  Filled 2016-01-22: qty 1

## 2016-01-22 MED ORDER — ACETAMINOPHEN 325 MG PO TABS
650.0000 mg | ORAL_TABLET | Freq: Once | ORAL | Status: AC
  Administered 2016-01-22: 650 mg via ORAL

## 2016-01-22 MED ORDER — SODIUM CHLORIDE 0.9% FLUSH
10.0000 mL | INTRAVENOUS | Status: DC | PRN
  Administered 2016-01-22: 10 mL
  Filled 2016-01-22: qty 10

## 2016-01-22 MED ORDER — SODIUM CHLORIDE 0.9 % IV SOLN
Freq: Once | INTRAVENOUS | Status: AC
  Administered 2016-01-22: 10:00:00 via INTRAVENOUS

## 2016-01-22 MED ORDER — PALONOSETRON HCL INJECTION 0.25 MG/5ML
INTRAVENOUS | Status: AC
  Filled 2016-01-22: qty 5

## 2016-01-22 MED ORDER — SODIUM CHLORIDE 0.9 % IV SOLN
722.5000 mg | Freq: Once | INTRAVENOUS | Status: AC
  Administered 2016-01-22: 720 mg via INTRAVENOUS
  Filled 2016-01-22: qty 60

## 2016-01-22 MED ORDER — HEPARIN SOD (PORK) LOCK FLUSH 100 UNIT/ML IV SOLN
500.0000 [IU] | Freq: Once | INTRAVENOUS | Status: AC | PRN
  Administered 2016-01-22: 500 [IU]
  Filled 2016-01-22: qty 5

## 2016-01-22 MED ORDER — SODIUM CHLORIDE 0.9 % IV SOLN
10.0000 mg | Freq: Once | INTRAVENOUS | Status: AC
  Administered 2016-01-22: 10 mg via INTRAVENOUS
  Filled 2016-01-22: qty 1

## 2016-01-22 MED ORDER — PEGFILGRASTIM 6 MG/0.6ML ~~LOC~~ PSKT
6.0000 mg | PREFILLED_SYRINGE | Freq: Once | SUBCUTANEOUS | Status: AC
  Administered 2016-01-22: 6 mg via SUBCUTANEOUS
  Filled 2016-01-22: qty 0.6

## 2016-01-22 MED ORDER — PALONOSETRON HCL INJECTION 0.25 MG/5ML
0.2500 mg | Freq: Once | INTRAVENOUS | Status: AC
  Administered 2016-01-22: 0.25 mg via INTRAVENOUS

## 2016-01-22 NOTE — Patient Instructions (Signed)
[facility] Discharge Instructions for Patients Receiving Chemotherapy  Today you received the following chemotherapy agents; Herceptin, Perjeta, Taxotere and Carboplatin.   To help prevent nausea and vomiting after your treatment, we encourage you to take your nausea medication as directed.    If you develop nausea and vomiting that is not controlled by your nausea medication, call the clinic.   BELOW ARE SYMPTOMS THAT SHOULD BE REPORTED IMMEDIATELY:  *FEVER GREATER THAN 100.5 F  *CHILLS WITH OR WITHOUT FEVER  NAUSEA AND VOMITING THAT IS NOT CONTROLLED WITH YOUR NAUSEA MEDICATION  *UNUSUAL SHORTNESS OF BREATH  *UNUSUAL BRUISING OR BLEEDING  TENDERNESS IN MOUTH AND THROAT WITH OR WITHOUT PRESENCE OF ULCERS  *URINARY PROBLEMS  *BOWEL PROBLEMS  UNUSUAL RASH Items with * indicate a potential emergency and should be followed up as soon as possible.  Feel free to call the clinic you have any questions or concerns. The clinic phone number is [phone].  Please show the CHEMO ALERT CARD at check-in to the Emergency Department and triage nurse.   

## 2016-01-22 NOTE — Progress Notes (Signed)
[place] Work  Holiday representative met with patient in the infusion room to offer support and follow up regarding financial applications.  Patient stated she had not completed the applications, but planned to do so this week.  CSW offered support in completing and submitting applications.  Patient stated she would contact CSW once she reviews applications or with questions.  CSW and patient also discussed the importance of emotional support and support services at [facility].  Patient expressed interest in support services and was agreeable to an alight guide referral.  CSW will continue to follow and support patient as needed.  CSW encouraged patient to call with any questions or concerns.   Johnnye Lana, MSW, LCSW, OSW-C Clinical Social Worker [facility] [phone]

## 2016-01-22 NOTE — Progress Notes (Signed)
Infusion room visit  Counselor stopped in to visit patient in the infusion room. Counselor used open question to see how patient was doing. Patient stated she was feeling anxious, sad, and guilty about her cancer, and was struggling with feeling like she is not being "strong enough". Counselor used validation and support regarding the patient's feelings, and acknowledged how challenging of a time this was for her. Counselor used open question to explore diagnosis and support system. Client reported having strong support and her faith being a big help as well. Counselor used psycho education to introduce ACT therapy, and the importance of not labeling feelings as good or bad but allowing them to exist. Patient was receptive, and stated she could do a better job of extending herself grace. Counselor and patient processed the importance of allowing herself to receive help. Patient stated she was interested in seeking counseling services with another intern after I leave around her anxiety, guilt, relationship struggles, and just having space to process how she is feeling. Counselor introduced breast cancer support group and alight foundation to offer more support resources. Counselor stated she would meet with patient next week for my final week at the Cancer center. Patient stated she will look at her schedule and follow up with me about scheduling an appointment next week. Counselor will wait to hear from patient.  Wendall Papa, MS, [place], LPCA Counseling Intern-Department for Spiritual Care and [place], [place], [place]

## 2016-01-22 NOTE — Progress Notes (Signed)
[name]  Telephone:([phone]) [phone] Fax:(336) [phone]     ID: [name] DOB: [birth year]  MR#: [id number]  MCN#:[id number]  Patient Care Team: No Pcp Per Patient as PCP - General (General Practice) [name], MD as Consulting Physician (Oncology) [name], MD as Consulting Physician (General Surgery) [name], MD as Consulting Physician (Obstetrics and Gynecology) PCP: No PCP Per Patient OTHER MD:  CHIEF COMPLAINT: Estrogen receptor positive breast cancer  CURRENT TREATMENT: neoadjuvant chemotherapy and anti-HER-2 immunotherapy   BREAST CANCER HISTORY: From the original intake note:  [name] woke up the morning of 11/09/2015 with some pain in her left axilla. She examined herself and found a lump in her left breast. She saw her gynecologist the same day, and she confirms a lump. The patient then proceeded directly to mammography. I do not have that report. However it did show the mass. [name] was then scheduled for ultrasound-guided biopsy 11/21/2015. This showed ([place] [phone] at the [facility]) and invasive ductal carcinoma, grade 2 estrogen receptor positive, with moderate intensity (10-50%), progesterone receptor negative, and HER-2 equivocal by immunohistochemistry. Fish was obtained and was equivocal as well, with a signals ratio of 1.8 to, the number per cell being 5.7.  On 12/05/2015 the patient had a CT/ angiogram of the chest for evaluation of her left-sided chest pain. This showed no evidence of a clot. The left breast nodule measured 1.3 cm on the study. There was no enlarged axillary adenopathy. There was also no evidence of lung metastasis or blastic or destructive lytic bone lesions. The upper abdomen was unremarkable.  With that information the patient presents for further evaluation and treatment.  INTERVAL HISTORY: [name] returns today for follow-up of her triple positive breast cancer  accompanied by her husband [name]. Today is day 1 cycle 2 of 6 planned doses of carboplatin, docetaxel, trastuzumab and pertuzumab given every 21 days, to be followed by trastuzumab alone for one year.  REVIEW OF SYSTEMS: [name]  Eyedrops and shefeels comfortable with those. She no longer has any sores on her tongue although her gums are still a little bit on the tender side. Her diarrhea has completely resolved.She didn't lose all her hair. Her husband went ahead and cut offfor her.This was  Still a shock even though she had prepared herself for.She has had a little bit of discomfort in her left temple at times and a little bit of a headache for the last 2 days Her vision is fine and she has no nausea or vomiting problems she still has tenderness in her left breast and she takes tramadol now but only twice daily. She is a little bit of a runny nose. She can be short of breath when climbing stairs. She is having hot flashes. A detailed review of systems today was otherwise noncontributory  PAST MEDICAL HISTORY: Past Medical History  Diagnosis Date  . Endometriosis   . Depression   . Anxiety   . Hormone disorder   . Herniated disc, cervical   . Neck pain   . Migraine without aura   . Breast cancer ([place]) 11/20/15    left breast  . Family history of breast cancer   . Allergy   . IBD (inflammatory bowel disease)   . PONV (postoperative nausea and vomiting)   . Shortness of breath dyspnea     anxiety   . History of bronchitis as a child   . Asthma     childhood   .  History of urinary tract infection   . GERD (gastroesophageal reflux disease)   . History of stomach ulcers     PAST SURGICAL HISTORY: Past Surgical History  Procedure Laterality Date  . Abdominal hysterectomy      Still has ovaries  . Portacath placement Right 12/25/2015    Procedure: INSERTION PORT-A-CATH WITH ULTRA SOUND;  Surgeon: [name], MD;  Location: [facility];  Service: General;  Laterality: Right;    FAMILY  HISTORY Family History  Problem Relation Age of Onset  . Anesthesia problems Neg Hx   . Hypotension Neg Hx   . Malignant hyperthermia Neg Hx   . Pseudochol deficiency Neg Hx   . Sarcoidosis Mother   . Hypertension Father   . Throat cancer Maternal Grandfather     smoker and heavy drinker; dx in his late 42s-50s  . Cancer Paternal Grandmother     possible gatric vs bladder cancer  . Bladder Cancer Paternal Grandmother   . Breast cancer Paternal Aunt     dxin her 32s; dad's maternal half sister  . Breast cancer Other     PGFs mother  The patient's parents are both living, in their early 35s. The patient has one brother and one sister. On the father's side 1 great grandmother was diagnosed with breast cancer in her 50s. The patient's father's mother was diagnosed with stomach cancer. One of the patient's father's sisters was diagnosed with breast cancer in her 34s. On the maternal side patient's mother's father was diagnosed with throat cancer at age.   GYNECOLOGIC HISTORY:  No LMP recorded. Patient has had a hysterectomy.  menarche age 89, first live birth age 86. The patient is GX P1. She stopped having periods in 2008, when she underwent a simple hysterectomy without salpingo-oophorectomy. She has a history of endometriosis and was started on Provera in March of this year, with her next dose due next week. (However she has decided to forego further Provera treatments at least for now).   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 He is licensed in [place] but not in [place]. He is now in this area looking for a job. The patient's son [name] is a Ship broker in music at [employer]: Not in place   HEALTH MAINTENANCE: Social History  Substance Use Topics  . Smoking status: Never Smoker   . Smokeless  tobacco: Never Used  . Alcohol Use: 0.0 oz/week    0 Standard drinks or equivalent per week     Comment: socially      Colonoscopy:  PAP:  Bone density:  Lipid panel:  Allergies  Allergen Reactions  . Amoxicillin Shortness Of Breath and Itching    Has patient had a PCN reaction causing immediate rash, facial/tongue/throat swelling, SOB or lightheadedness with hypotension: Yes Has patient had a PCN reaction causing severe rash involving mucus membranes or skin necrosis: No Has patient had a PCN reaction that required hospitalization Yes Has patient had a PCN reaction occurring within the last 10 years: No If all of the above answers are "NO", then may proceed with Cephalosporin use.     Current Outpatient Prescriptions  Medication Sig Dispense Refill  . cycloSPORINE (RESTASIS) 0.05 % ophthalmic emulsion Place 1 drop into both eyes 2 (two) times daily. 5.5 mL 1  . dexamethasone (  DECADRON) 4 MG tablet Take 2 tablets (8 mg total) by mouth 2 (two) times daily. Start the day before Taxotere. Then again the day after chemo for 3 days. 30 tablet 1  . doxycycline (VIBRA-TABS) 100 MG tablet Take 1 tablet (100 mg total) by mouth daily. 60 tablet 1  . lidocaine-prilocaine (EMLA) cream Apply to affected area once 30 g 3  . LORazepam (ATIVAN) 0.5 MG tablet Take 1 tablet (0.5 mg total) by mouth at bedtime as needed (Nausea or vomiting). 30 tablet 0  . prochlorperazine (COMPAZINE) 10 MG tablet Take 1 tablet (10 mg total) by mouth every 6 (six) hours as needed (Nausea or vomiting). 30 tablet 1  . traMADol (ULTRAM) 50 MG tablet Take 1 tablet (50 mg total) by mouth every 6 (six) hours as needed for moderate pain. 60 tablet 1  . valACYclovir (VALTREX) 500 MG tablet Take 1 tablet (500 mg total) by mouth daily. 60 tablet 1  . venlafaxine (EFFEXOR) 37.5 MG tablet Take 1 tablet (37.5 mg total) by mouth 2 (two) times daily. 30 tablet 0  . venlafaxine XR (EFFEXOR-XR) 75 MG 24 hr capsule Take 1 capsule (75 mg  total) by mouth daily with breakfast. 90 capsule 4  . zolpidem (AMBIEN) 10 MG tablet Take 1/2 to 1 tab po qhs prn (Patient taking differently: Take 5-10 mg by mouth at bedtime as needed for sleep. Take 1/2 to 1 tab po qhs prn) 30 tablet 0   No current facility-administered medications for this visit.    OBJECTIVE: Early middle-aged African-American woman  In no acute distress Filed Vitals:   01/22/16 0905  BP: 161/86  Pulse: 108  Temp: 98.4 F (36.9 C)  Resp: 18     Body mass index is 31.55 kg/(m^2).    ECOG FS:1 - Symptomatic but completely ambulatory  Sclerae unicteric, pupils round and equal Oropharynx clear and moist-- no thrush or other lesions No cervical or supraclavicular adenopathy Lungs no rales or rhonchi Heart regular rate and rhythm Abd soft, nontender, positive bowel sounds MSK no focal spinal tenderness, no upper extremity lymphedema Neuro: nonfocal, well oriented, appropriate affect Breasts: Deferred  LAB RESULTS:  CMP     Component Value Date/Time   NA 141 01/11/2016 1041   NA 138 12/04/2015 1858   K 3.8 01/11/2016 1041   K 3.9 12/04/2015 1858   CL 107 12/04/2015 1858   CO2 26 01/11/2016 1041   CO2 25 12/04/2015 1858   GLUCOSE 89 01/11/2016 1041   GLUCOSE 89 12/04/2015 1858   BUN 11.9 01/11/2016 1041   BUN 13 12/04/2015 1858   CREATININE 0.8 01/11/2016 1041   CREATININE 0.78 12/04/2015 1858   CALCIUM 8.9 01/11/2016 1041   CALCIUM 9.7 12/04/2015 1858   PROT 6.6 01/11/2016 1041   PROT 7.6 05/17/2015 1215   ALBUMIN 3.4* 01/11/2016 1041   ALBUMIN 4.5 05/17/2015 1215   AST 42* 01/11/2016 1041   AST 20 05/17/2015 1215   ALT 113* 01/11/2016 1041   ALT 20 05/17/2015 1215   ALKPHOS 99 01/11/2016 1041   ALKPHOS 51 05/17/2015 1215   BILITOT <0.30 01/11/2016 1041   BILITOT 0.6 05/17/2015 1215   GFRNONAA >60 12/04/2015 1858   GFRAA >60 12/04/2015 1858    INo results found for: SPEP, UPEP  Lab Results  Component Value Date   WBC 15.6* 01/22/2016    NEUTROABS 13.5* 01/22/2016   HGB 10.7* 01/22/2016   HCT 32.5* 01/22/2016   MCV 87.2 01/22/2016   PLT 273 01/22/2016  Chemistry      Component Value Date/Time   NA 141 01/11/2016 1041   NA 138 12/04/2015 1858   K 3.8 01/11/2016 1041   K 3.9 12/04/2015 1858   CL 107 12/04/2015 1858   CO2 26 01/11/2016 1041   CO2 25 12/04/2015 1858   BUN 11.9 01/11/2016 1041   BUN 13 12/04/2015 1858   CREATININE 0.8 01/11/2016 1041   CREATININE 0.78 12/04/2015 1858      Component Value Date/Time   CALCIUM 8.9 01/11/2016 1041   CALCIUM 9.7 12/04/2015 1858   ALKPHOS 99 01/11/2016 1041   ALKPHOS 51 05/17/2015 1215   AST 42* 01/11/2016 1041   AST 20 05/17/2015 1215   ALT 113* 01/11/2016 1041   ALT 20 05/17/2015 1215   BILITOT <0.30 01/11/2016 1041   BILITOT 0.6 05/17/2015 1215       No results found for: LABCA2  No components found for: LABCA125  No results for input(s): INR in the last 168 hours.  Urinalysis    Component Value Date/Time   COLORURINE STRAW* 12/11/2008 1923   APPEARANCEUR CLEAR 12/11/2008 1923   LABSPEC 1.020 12/11/2008 1923   PHURINE 6.0 12/11/2008 1923   GLUCOSEU NEGATIVE 12/11/2008 1923   HGBUR NEGATIVE 12/11/2008 1923   BILIRUBINUR NEGATIVE 12/11/2008 1923   KETONESUR NEGATIVE 12/11/2008 1923   PROTEINUR NEGATIVE 12/11/2008 1923   UROBILINOGEN 0.2 12/11/2008 1923   NITRITE NEGATIVE 12/11/2008 1923   LEUKOCYTESUR  12/11/2008 1923    NEGATIVE MICROSCOPIC NOT DONE ON URINES WITH NEGATIVE PROTEIN, BLOOD, LEUKOCYTES, NITRITE, OR GLUCOSE <1000 mg/dL.      ELIGIBLE FOR AVAILABLE RESEARCH PROTOCOL: no  STUDIES: Dg Chest Port 1 View  12/25/2015  CLINICAL DATA:  Status post Port-A-Cath placement on the right today. EXAM: PORTABLE CHEST 1 VIEW COMPARISON:  PA and lateral chest 12/04/2015.  CT chest 12/05/2015. FINDINGS: New right IJ approach Port-A-Cath is in place with the tip projecting in the mid superior vena cava. Tubing is intact. Lungs are clear. No  pneumothorax. Heart size normal. No focal bony abnormality. IMPRESSION: Tip of Port-A-Cath projects in the mid superior vena cava. Negative for pneumothorax or acute disease. Electronically Signed   By: Inge Rise M.D.   On: 12/25/2015 09:08   Dg C-arm 1-60 Min-no Report  12/25/2015  CLINICAL DATA: surgery C-ARM 1-60 MINUTES Fluoroscopy was utilized by the requesting physician.  No radiographic interpretation.    ASSESSMENT: 40 y.o. [place] woman status post left breast upper outer quadrant biopsy 11/20/2015 for a clinical T1c N0, stage IA invasive ductal carcinoma, grade 2, estrogen receptor moderately positive, progesterone receptor negative, HER-2 equivocal by both immunohistochemistry and FISH  (1) neoadjuvant tamoxifen started 12/11/2015, stopped at the start of chemotherapy  (2) genetics testing 01/03/2016 through the Breast/Ovarian gene panel offered by [facility] found no deleterious mutations in ATM, BARD1, BRCA1, BRCA2, BRIP1, CDH1, CHEK2, EPCAM, FANCC, MLH1, MSH2, MSH6, NBN, PALB2, PMS2, PTEN, RAD51C, RAD51D, TP53, and XRCC2  (3) neoadjuvant chemotherapy consisting of carboplatin, docetaxel, trastuzumab and pertuzumab every 21 days 6 starting 01/01/2016  (4) definitive surgery to follow  (5) adjuvant radiation to follow surgery   (6) anti-estrogens to be continued 5-10 years, more likely the latter    PLAN: [name]  Is proceeding to her second cycle of chemotherapy today. We reviewed the changes we have made, which are relatively minor. She [name] continue the Decadron through day 3 instead of stopping the morning of day 2. She is already using the Imodium appropriately.  I think  it would be helpful if she took Claritin daily for the next 2 or 3 weeks. That [name] help with the sinuses and should clear the headache problem.  I am glad she is having a little bit less discomfort in her left breast area. We [name] examine that after her third cycle of chemotherapy and I would expect a  measurable difference by examination.   We again discussed the diarrhea problem.  This as a major concern withpertuzumab. She [name] call since she has any difficulty or any problems remaining hydrated.  I have not scheduled to see me on the day 8 but she [name] call with any problems that may develop before her next visit here which [name] be in 21 days. [name], MD   01/22/2016 9:16 AM Medical Oncology and Hematology [facility] [address] [place], [place] [address] Tel. [phone]    Fax. [phone]

## 2016-01-23 ENCOUNTER — Telehealth: Payer: Self-pay

## 2016-01-23 NOTE — Telephone Encounter (Signed)
Call report rcvd from [place] 01/22/16 7:34 pm - sent to scan.  Pt LMOVM 01/23/16.  Writer returned pt call - she wanted confirmation of how/when to take her medications.  Medications were reviewed with patient and she voiced understanding.

## 2016-01-24 ENCOUNTER — Ambulatory Visit: Payer: BLUE CROSS/BLUE SHIELD

## 2016-01-29 ENCOUNTER — Ambulatory Visit (HOSPITAL_BASED_OUTPATIENT_CLINIC_OR_DEPARTMENT_OTHER): Payer: BLUE CROSS/BLUE SHIELD | Admitting: Oncology

## 2016-01-29 VITALS — BP 154/91 | HR 107 | Temp 98.5°F | Resp 18 | Ht 64.0 in | Wt 184.1 lb

## 2016-01-29 DIAGNOSIS — R438 Other disturbances of smell and taste: Secondary | ICD-10-CM

## 2016-01-29 DIAGNOSIS — C50412 Malignant neoplasm of upper-outer quadrant of left female breast: Secondary | ICD-10-CM

## 2016-01-29 DIAGNOSIS — Z170 Estrogen receptor positive status [ER+]: Secondary | ICD-10-CM | POA: Diagnosis not present

## 2016-01-29 NOTE — Progress Notes (Signed)
[facility]  [phone] [phone]     ID: STEPHEN TURNBAUGH DOB: [birth year]  MR#: [id number]  KWI#:[id number]  Patient Care Team: No Pcp Per Patient as PCP - General (General Practice) [name], MD as Consulting Physician (Oncology) [name], MD as Consulting Physician (General Surgery) [name], MD as Consulting Physician (Obstetrics and Gynecology) PCP: No PCP Per Patient OTHER MD:  CHIEF COMPLAINT: Estrogen receptor positive breast cancer  CURRENT TREATMENT: neoadjuvant chemotherapy and anti-HER-2 immunotherapy   BREAST CANCER HISTORY: From the original intake note:  [name] woke up the morning of 11/09/2015 with some pain in her left axilla. She examined herself and found a lump in her left breast. She saw her gynecologist the same day, and she confirms a lump. The patient then proceeded directly to mammography. I do not have that report. However it did show the mass. [name] was then scheduled for ultrasound-guided biopsy 11/21/2015. This showed ([place] [phone] at the [employer] school [facility] medicine) and invasive ductal carcinoma, grade 2 estrogen receptor positive, with moderate intensity (10-50%), progesterone receptor negative, and HER-2 equivocal by immunohistochemistry. Fish was obtained and was equivocal as well, with a signals ratio of 1.8 to, the number per cell being 5.7.  On 12/05/2015 the patient had a CT/ angiogram of the chest for evaluation of her left-sided chest pain. This showed no evidence of a clot. The left breast nodule measured 1.3 cm on the study. There was no enlarged axillary adenopathy. There was also no evidence of lung metastasis or blastic or destructive lytic bone lesions. The upper abdomen was unremarkable.  With that information the patient presents for further evaluation and treatment.  INTERVAL HISTORY: [name] returns today for follow-up of her HER-2 positive breast cancer  accompanied by her husband [name]. Today is day 8 cycle 2 of 6 planned doses of carboplatin, docetaxel, trastuzumab and pertuzumab given every 21 days, to be followed by trastuzumab alone for one year.  REVIEW OF SYSTEMS: [name] did a little bit better with the second cycle than the first, although not much. She did take additional dexamethasone, but she still felt pretty tired. She is taking the Claritin daily, not just for 3 days, but she still was pretty T. She is using tramadol up to 4 times a day. This does not constipate her. She is doing very well as far as the pertuzumab associated diarrhea. She is having may be too loose bowel movements a day. Yesterday she only had one bowel movement which was. She has only had to use Imodium 1 time. What frustrates her the most is the altered taste. Her appetite is down. Thinks just don't taste good at all, including water area she is very sensitive to any smell. She also has a little bit of heartburn. She admits to being a little bit depressed. Aside from these issues a detailed review of systems today was stable.  PAST MEDICAL HISTORY: Past Medical History:  Diagnosis Date  . Allergy   . Anxiety   . Asthma    childhood   . Breast cancer ([facility]) 11/20/15   left breast  . Depression   . Endometriosis   . Family history of breast cancer   . GERD (gastroesophageal reflux disease)   . Herniated disc, cervical   . History of bronchitis as a child   . History of stomach ulcers   . History of urinary tract infection   . Hormone disorder   . IBD (inflammatory  bowel disease)   . Migraine without aura   . Neck pain   . PONV (postoperative nausea and vomiting)   . Shortness of breath dyspnea    anxiety     PAST SURGICAL HISTORY: Past Surgical History:  Procedure Laterality Date  . ABDOMINAL HYSTERECTOMY     Still has ovaries  . PORTACATH PLACEMENT Right 12/25/2015   Procedure: INSERTION PORT-A-CATH WITH ULTRA SOUND;  Surgeon: [name], MD;   Location: [facility];  Service: General;  Laterality: Right;    FAMILY HISTORY Family History  Problem Relation Age of Onset  . Anesthesia problems Neg Hx   . Hypotension Neg Hx   . Malignant hyperthermia Neg Hx   . Pseudochol deficiency Neg Hx   . Sarcoidosis Mother   . Hypertension Father   . Throat cancer Maternal Grandfather     smoker and heavy drinker; dx in his late 76s-50s  . Cancer Paternal Grandmother     possible gatric vs bladder cancer  . Bladder Cancer Paternal Grandmother   . Breast cancer Paternal Aunt     dxin her 70s; dad's maternal half sister  . Breast cancer Other     PGFs mother  The patient's parents are both living, in their early 67s. The patient has one brother and one sister. On the father's side 1 great grandmother was diagnosed with breast cancer in her 70s. The patient's father's mother was diagnosed with stomach cancer. One of the patient's father's sisters was diagnosed with breast cancer in her 105s. On the maternal side patient's mother's father was diagnosed with throat cancer at age.   GYNECOLOGIC HISTORY:  No LMP recorded. Patient has had a hysterectomy.  menarche age 40, first live birth age 40. The patient is GX P1. She stopped having periods in 2008, when she underwent a simple hysterectomy without salpingo-oophorectomy. She has a history of endometriosis and was started on Provera in March of this year, with her next dose due next week. (However she has decided to forego further Provera treatments at least for now).   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 He is licensed in [place] but not in [place]. He is now in this area looking for a job. The patient's son [name] is a Ship broker in music at [employer]: Not in place   HEALTH MAINTENANCE: Social History    Substance Use Topics  . Smoking status: Never Smoker  . Smokeless tobacco: Never Used  . Alcohol use 0.0 oz/week     Comment: socially      Colonoscopy:  PAP:  Bone density:  Lipid panel:  Allergies  Allergen Reactions  . Amoxicillin Shortness Of Breath and Itching    Has patient had a PCN reaction causing immediate rash, facial/tongue/throat swelling, SOB or lightheadedness with hypotension: Yes Has patient had a PCN reaction causing severe rash involving mucus membranes or skin necrosis: No Has patient had a PCN reaction that required hospitalization Yes Has patient had a PCN reaction occurring within the last 10 years: No If all of the above answers are "NO", then may proceed with Cephalosporin use.     Current Outpatient Prescriptions  Medication Sig Dispense Refill  . dexamethasone (DECADRON) 4 MG tablet Take 2 tablets (8 mg total) by mouth 2 (two) times daily. Start the day before  Taxotere. Then again the day after chemo for 3 days. 30 tablet 1  . doxycycline (VIBRA-TABS) 100 MG tablet Take 1 tablet (100 mg total) by mouth daily. 60 tablet 1  . lidocaine-prilocaine (EMLA) cream Apply to affected area once 30 g 3  . LORazepam (ATIVAN) 0.5 MG tablet Take 1 tablet (0.5 mg total) by mouth at bedtime as needed (Nausea or vomiting). 30 tablet 0  . prochlorperazine (COMPAZINE) 10 MG tablet Take 1 tablet (10 mg total) by mouth every 6 (six) hours as needed (Nausea or vomiting). 30 tablet 1  . traMADol (ULTRAM) 50 MG tablet Take 1 tablet (50 mg total) by mouth every 6 (six) hours as needed for moderate pain. 60 tablet 1  . valACYclovir (VALTREX) 500 MG tablet Take 1 tablet (500 mg total) by mouth daily. 60 tablet 1  . venlafaxine (EFFEXOR) 37.5 MG tablet Take 1 tablet (37.5 mg total) by mouth 2 (two) times daily. 30 tablet 0  . zolpidem (AMBIEN) 10 MG tablet Take 1/2 to 1 tab po qhs prn (Patient taking differently: Take 5-10 mg by mouth at bedtime as needed for sleep. Take 1/2 to 1  tab po qhs prn) 30 tablet 0   No current facility-administered medications for this visit.     OBJECTIVE: Early middle-aged African-American woman Who appears stated age Vitals:   01/29/16 0844  BP: (!) 154/91  Pulse: (!) 107  Resp: 18  Temp: 98.5 F (36.9 C)     Body mass index is 31.6 kg/m.    ECOG FS:1 - Symptomatic but completely ambulatory Filed Weights   01/29/16 0844  Weight: 184 lb 1.6 oz (83.5 kg)    Sclerae unicteric, EOMs intact Oropharynx clear and moist No cervical or supraclavicular adenopathy Lungs no rales or rhonchi Heart regular rate and rhythm Abd soft, nontender, positive bowel sounds MSK no focal spinal tenderness, no upper extremity lymphedema Neuro: nonfocal, well oriented, appropriate affect Breasts: The right breast is unremarkable.  I no longer palpate a mass in the left breast upper outer quadrant   LAB RESULTS:  CMP     Component Value Date/Time   NA 141 01/22/2016 0851   K 3.9 01/22/2016 0851   CL 107 12/04/2015 1858   CO2 22 01/22/2016 0851   GLUCOSE 146 (H) 01/22/2016 0851   BUN 10.9 01/22/2016 0851   CREATININE 0.8 01/22/2016 0851   CALCIUM 9.7 01/22/2016 0851   PROT 7.3 01/22/2016 0851   ALBUMIN 3.7 01/22/2016 0851   AST 18 01/22/2016 0851   ALT 27 01/22/2016 0851   ALKPHOS 70 01/22/2016 0851   BILITOT 0.34 01/22/2016 0851   GFRNONAA >60 12/04/2015 1858   GFRAA >60 12/04/2015 1858    INo results found for: SPEP, UPEP  Lab Results  Component Value Date   WBC 15.6 (H) 01/22/2016   NEUTROABS 13.5 (H) 01/22/2016   HGB 10.7 (L) 01/22/2016   HCT 32.5 (L) 01/22/2016   MCV 87.2 01/22/2016   PLT 273 01/22/2016      Chemistry      Component Value Date/Time   NA 141 01/22/2016 0851   K 3.9 01/22/2016 0851   CL 107 12/04/2015 1858   CO2 22 01/22/2016 0851   BUN 10.9 01/22/2016 0851   CREATININE 0.8 01/22/2016 0851      Component Value Date/Time   CALCIUM 9.7 01/22/2016 0851   ALKPHOS 70 01/22/2016 0851   AST 18  01/22/2016 0851   ALT 27 01/22/2016 0851   BILITOT 0.34 01/22/2016 3300  No results found for: LABCA2  No components found for: [id number]  No results for input(s): INR in the last 168 hours.  Urinalysis    Component Value Date/Time   COLORURINE STRAW (A) 12/11/2008 1923   APPEARANCEUR CLEAR 12/11/2008 1923   LABSPEC 1.020 12/11/2008 1923   PHURINE 6.0 12/11/2008 1923   GLUCOSEU NEGATIVE 12/11/2008 1923   HGBUR NEGATIVE 12/11/2008 1923   BILIRUBINUR NEGATIVE 12/11/2008 1923   KETONESUR NEGATIVE 12/11/2008 1923   PROTEINUR NEGATIVE 12/11/2008 1923   UROBILINOGEN 0.2 12/11/2008 1923   NITRITE NEGATIVE 12/11/2008 1923   LEUKOCYTESUR  12/11/2008 1923    NEGATIVE MICROSCOPIC NOT DONE ON URINES WITH NEGATIVE PROTEIN, BLOOD, LEUKOCYTES, NITRITE, OR GLUCOSE <1000 mg/dL.      ELIGIBLE FOR AVAILABLE RESEARCH PROTOCOL: no  STUDIES: No results found.  ASSESSMENT: 40 y.o. [place] woman status post left breast upper outer quadrant biopsy 11/20/2015 for a clinical T1c N0, stage IA invasive ductal carcinoma, grade 2, estrogen receptor moderately positive, progesterone receptor negative, HER-2 equivocal by both immunohistochemistry and FISH  (1) neoadjuvant tamoxifen started 12/11/2015, stopped at the start of chemotherapy  (2) genetics testing 01/03/2016 through the Breast/Ovarian gene panel offered by [facility] found no deleterious mutations in ATM, BARD1, BRCA1, BRCA2, BRIP1, CDH1, CHEK2, EPCAM, FANCC, MLH1, MSH2, MSH6, NBN, PALB2, PMS2, PTEN, RAD51C, RAD51D, TP53, and XRCC2  (3) neoadjuvant chemotherapy consisting of carboplatin, docetaxel, trastuzumab and pertuzumab every 21 days 6 starting 01/01/2016  (4) definitive surgery to follow  (5) adjuvant radiation to follow surgery   (6) anti-estrogens to be continued 5-10 years, more likely the latter    PLAN: (if all fine, no day 8 visits [name] Is tolerating her treatments moderately well and we have initial evidence of  response by physical exam. This is very favorable.  The taste issue really is bothersome. We discussed diet changes to avoid our medical foods, and cooking trials to see whether she prefers salty, Kinnie Scales, or suite. I suspect like most of my patients in her situation she would do well to add a little bit of vinegar a little bit of vinegar to her food or little bit of lemon juice or water. She can also experiment with different herbal teas, without sugar.  If she is getting sensitized to smells or sites I recommend that she use her Ativan a little bit more frequently. That [name] "disconnect" the reflex issue with vomiting.  Her weight is excellent, actually down about 3 pounds. She is exercising by walking and I encouraged her to continue and increase that  She [name] see me again with cycle 3. She knows to call for any problems that may develop before that visit. [name], MD   01/29/2016 8:54 AM Medical Oncology and Hematology [facility] [address] [place], [place] [address] Tel. [phone]    Fax. [phone]

## 2016-01-31 ENCOUNTER — Encounter: Payer: Self-pay | Admitting: General Practice

## 2016-01-31 NOTE — Progress Notes (Signed)
Telephone call  Counselor followed up with client regarding her missed appointment yesterday, due to a miscommunication around scheduling. Counselor checked in with client and reminded client of other support services that she could utilize since the counselor is leaving this week. Client expressed interest in meeting with Lattie Haw, as well as getting connected with another intern in the fall. Counselor gave client the contact information of the Chaplain, and encouraged her to reach out when she has time to schedule a meeting. Counselor apologized for scheduling mishap, and wished client well moving forward.  Wendall Papa, MS, [place], [facility] Counseling Intern-Department for Spiritual Care and [place], [place], [place]

## 2016-02-03 ENCOUNTER — Other Ambulatory Visit: Payer: Self-pay | Admitting: Oncology

## 2016-02-04 ENCOUNTER — Telehealth: Payer: Self-pay | Admitting: *Deleted

## 2016-02-04 NOTE — Telephone Encounter (Signed)
This RN spoke with pt per her call stating she has taken her last tapered dose of effexor " and now I need to know what medication I can take for my anxiety "  Noted pt was put on effexor but developed hallucinations -   Per discussion of above- this RN informed pt above will be discussed at her next office visit - she is presently using ativan in the evening only.  Plan will be she will use 1/2 tab if needed during the day for feelings of anxiety.  Bernett verbalized understanding.  This note will be given to MD for appropriate recommendation.

## 2016-02-08 ENCOUNTER — Telehealth: Payer: Self-pay

## 2016-02-08 DIAGNOSIS — C50412 Malignant neoplasm of upper-outer quadrant of left female breast: Secondary | ICD-10-CM

## 2016-02-08 MED ORDER — LORAZEPAM 0.5 MG PO TABS: 0.5000 mg | ORAL_TABLET | Freq: Every evening | ORAL | 0 refills | Status: DC | PRN

## 2016-02-08 MED ORDER — TRAMADOL HCL 50 MG PO TABS: 50.0000 mg | ORAL_TABLET | Freq: Four times a day (QID) | ORAL | 1 refills | Status: DC | PRN

## 2016-02-08 NOTE — Telephone Encounter (Signed)
Pt called for refill on lorazepam, tramadol and zolpidem. Refill for lorazepam and tramadol called in to pharmacy. Zolpidem was prescribed by another MD and pt instructed to call them.

## 2016-02-11 ENCOUNTER — Other Ambulatory Visit: Payer: Self-pay | Admitting: *Deleted

## 2016-02-11 DIAGNOSIS — C50412 Malignant neoplasm of upper-outer quadrant of left female breast: Secondary | ICD-10-CM

## 2016-02-12 ENCOUNTER — Telehealth: Payer: Self-pay | Admitting: *Deleted

## 2016-02-12 ENCOUNTER — Ambulatory Visit (HOSPITAL_BASED_OUTPATIENT_CLINIC_OR_DEPARTMENT_OTHER): Payer: BLUE CROSS/BLUE SHIELD

## 2016-02-12 ENCOUNTER — Telehealth: Payer: Self-pay | Admitting: Oncology

## 2016-02-12 ENCOUNTER — Other Ambulatory Visit (HOSPITAL_BASED_OUTPATIENT_CLINIC_OR_DEPARTMENT_OTHER): Payer: BLUE CROSS/BLUE SHIELD

## 2016-02-12 ENCOUNTER — Ambulatory Visit (HOSPITAL_BASED_OUTPATIENT_CLINIC_OR_DEPARTMENT_OTHER): Payer: BLUE CROSS/BLUE SHIELD | Admitting: Oncology

## 2016-02-12 VITALS — BP 161/106 | HR 105 | Temp 98.3°F | Resp 18 | Ht 64.0 in | Wt 191.0 lb

## 2016-02-12 VITALS — BP 146/86 | HR 95 | Temp 98.1°F | Resp 18

## 2016-02-12 DIAGNOSIS — C50412 Malignant neoplasm of upper-outer quadrant of left female breast: Secondary | ICD-10-CM

## 2016-02-12 DIAGNOSIS — Z170 Estrogen receptor positive status [ER+]: Secondary | ICD-10-CM | POA: Diagnosis not present

## 2016-02-12 DIAGNOSIS — Z5112 Encounter for antineoplastic immunotherapy: Secondary | ICD-10-CM

## 2016-02-12 DIAGNOSIS — Z5111 Encounter for antineoplastic chemotherapy: Secondary | ICD-10-CM | POA: Diagnosis not present

## 2016-02-12 LAB — COMPREHENSIVE METABOLIC PANEL
ALT: 62 U/L — ABNORMAL HIGH (ref 0–55)
AST: 39 U/L — ABNORMAL HIGH (ref 5–34)
Albumin: 3.7 g/dL (ref 3.5–5.0)
Alkaline Phosphatase: 75 U/L (ref 40–150)
Anion Gap: 11 mEq/L (ref 3–11)
BUN: 18 mg/dL (ref 7.0–26.0)
CO2: 22 mEq/L (ref 22–29)
Calcium: 9.7 mg/dL (ref 8.4–10.4)
Chloride: 109 mEq/L (ref 98–109)
Creatinine: 0.9 mg/dL (ref 0.6–1.1)
EGFR: 88 mL/min/{1.73_m2} — ABNORMAL LOW (ref >90)
Glucose: 141 mg/dl — ABNORMAL HIGH (ref 70–140)
Potassium: 3.9 mEq/L (ref 3.5–5.1)
Sodium: 142 mEq/L (ref 136–145)
Total Bilirubin: 0.31 mg/dL (ref 0.20–1.20)
Total Protein: 7.5 g/dL (ref 6.4–8.3)

## 2016-02-12 LAB — CBC WITH DIFFERENTIAL/PLATELET
BASO%: 0.1 % (ref 0.0–2.0)
Basophils Absolute: 0 10*3/uL (ref 0.0–0.1)
EOS%: 0 % (ref 0.0–7.0)
Eosinophils Absolute: 0 10*3/uL (ref 0.0–0.5)
HCT: 31.4 % — ABNORMAL LOW (ref 34.8–46.6)
HGB: 10.6 g/dL — ABNORMAL LOW (ref 11.6–15.9)
LYMPH%: 5.9 % — ABNORMAL LOW (ref 14.0–49.7)
MCH: 30.3 pg (ref 25.1–34.0)
MCHC: 33.6 g/dL (ref 31.5–36.0)
MCV: 90.2 fL (ref 79.5–101.0)
MONO#: 0.4 10*3/uL (ref 0.1–0.9)
MONO%: 3.1 % (ref 0.0–14.0)
NEUT#: 13.3 10*3/uL — ABNORMAL HIGH (ref 1.5–6.5)
NEUT%: 90.9 % — ABNORMAL HIGH (ref 38.4–76.8)
Platelets: 282 10*3/uL (ref 145–400)
RBC: 3.49 10*6/uL — ABNORMAL LOW (ref 3.70–5.45)
RDW: 15.6 % — ABNORMAL HIGH (ref 11.2–14.5)
WBC: 14.6 10*3/uL — ABNORMAL HIGH (ref 3.9–10.3)
lymph#: 0.9 10*3/uL (ref 0.9–3.3)

## 2016-02-12 MED ORDER — PALONOSETRON HCL INJECTION 0.25 MG/5ML
0.2500 mg | Freq: Once | INTRAVENOUS | Status: AC
  Administered 2016-02-12: 0.25 mg via INTRAVENOUS

## 2016-02-12 MED ORDER — TRASTUZUMAB CHEMO 150 MG IV SOLR
6.0000 mg/kg | Freq: Once | INTRAVENOUS | Status: AC
  Administered 2016-02-12: 504 mg via INTRAVENOUS
  Filled 2016-02-12: qty 24

## 2016-02-12 MED ORDER — SODIUM CHLORIDE 0.9 % IV SOLN
75.0000 mg/m2 | Freq: Once | INTRAVENOUS | Status: AC
  Administered 2016-02-12: 140 mg via INTRAVENOUS
  Filled 2016-02-12: qty 14

## 2016-02-12 MED ORDER — SODIUM CHLORIDE 0.9% FLUSH
10.0000 mL | INTRAVENOUS | Status: DC | PRN
Start: 2016-02-12 — End: 2016-02-12
  Administered 2016-02-12: 10 mL
  Filled 2016-02-12: qty 10

## 2016-02-12 MED ORDER — PALONOSETRON HCL INJECTION 0.25 MG/5ML
INTRAVENOUS | Status: AC
  Filled 2016-02-12: qty 5

## 2016-02-12 MED ORDER — DOXYCYCLINE HYCLATE 100 MG PO TABS: 100.0000 mg | ORAL_TABLET | Freq: Every day | ORAL | 1 refills | Status: DC

## 2016-02-12 MED ORDER — SERTRALINE HCL 100 MG PO TABS: 100.0000 mg | ORAL_TABLET | Freq: Every day | ORAL | 3 refills | Status: DC

## 2016-02-12 MED ORDER — ACETAMINOPHEN 325 MG PO TABS
650.0000 mg | ORAL_TABLET | Freq: Once | ORAL | Status: AC
  Administered 2016-02-12: 650 mg via ORAL

## 2016-02-12 MED ORDER — SODIUM CHLORIDE 0.9 % IV SOLN
10.0000 mg | Freq: Once | INTRAVENOUS | Status: AC
  Administered 2016-02-12: 10 mg via INTRAVENOUS
  Filled 2016-02-12: qty 1

## 2016-02-12 MED ORDER — ACETAMINOPHEN 325 MG PO TABS
ORAL_TABLET | ORAL | Status: AC
  Filled 2016-02-12: qty 2

## 2016-02-12 MED ORDER — DIPHENHYDRAMINE HCL 25 MG PO CAPS
ORAL_CAPSULE | ORAL | Status: AC
Start: 2016-02-12 — End: 2016-02-12
  Filled 2016-02-12: qty 1

## 2016-02-12 MED ORDER — ZOLPIDEM TARTRATE 10 MG PO TABS: ORAL_TABLET | ORAL | 0 refills | Status: DC

## 2016-02-12 MED ORDER — SODIUM CHLORIDE 0.9 % IV SOLN
Freq: Once | INTRAVENOUS | Status: AC
  Administered 2016-02-12: 11:00:00 via INTRAVENOUS

## 2016-02-12 MED ORDER — PEGFILGRASTIM 6 MG/0.6ML ~~LOC~~ PSKT
6.0000 mg | PREFILLED_SYRINGE | Freq: Once | SUBCUTANEOUS | Status: AC
  Administered 2016-02-12: 6 mg via SUBCUTANEOUS
  Filled 2016-02-12: qty 0.6

## 2016-02-12 MED ORDER — SODIUM CHLORIDE 0.9 % IV SOLN
660.0000 mg | Freq: Once | INTRAVENOUS | Status: AC
  Administered 2016-02-12: 660 mg via INTRAVENOUS
  Filled 2016-02-12: qty 66

## 2016-02-12 MED ORDER — VALACYCLOVIR HCL 500 MG PO TABS: 500.0000 mg | ORAL_TABLET | Freq: Every day | ORAL | 1 refills | Status: DC

## 2016-02-12 MED ORDER — DIPHENHYDRAMINE HCL 25 MG PO CAPS
25.0000 mg | ORAL_CAPSULE | Freq: Once | ORAL | Status: AC
  Administered 2016-02-12: 25 mg via ORAL

## 2016-02-12 MED ORDER — HEPARIN SOD (PORK) LOCK FLUSH 100 UNIT/ML IV SOLN
500.0000 [IU] | Freq: Once | INTRAVENOUS | Status: AC | PRN
  Administered 2016-02-12: 500 [IU]
  Filled 2016-02-12: qty 5

## 2016-02-12 MED ORDER — SODIUM CHLORIDE 0.9 % IV SOLN
420.0000 mg | Freq: Once | INTRAVENOUS | Status: AC
  Administered 2016-02-12: 420 mg via INTRAVENOUS
  Filled 2016-02-12: qty 14

## 2016-02-12 NOTE — Telephone Encounter (Signed)
  Oncology Nurse Navigator Documentation    Navigator Encounter Type: Telephone (Left vm to assess needs during chemotherapy) (02/12/16 1600) Telephone: Lahoma Crocker Call (Contact information provided) (02/12/16 1600)       Treatment Initiated Date: 01/01/16 (02/12/16 1600) Patient Visit Type: MedOnc (02/12/16 1600) Treatment Phase: Treatment (02/12/16 1600)                            Time Spent with Patient: 15 (02/12/16 1600)

## 2016-02-12 NOTE — Patient Instructions (Signed)
[facility] Discharge Instructions for Patients Receiving Chemotherapy  Today you received the following chemotherapy agents Herceptin, Perjeta, Taxotere and Carboplatin, Neulasta. To help prevent nausea and vomiting after your treatment, we encourage you to take your nausea medication as directed.   If you develop nausea and vomiting that is not controlled by your nausea medication, call the clinic.   BELOW ARE SYMPTOMS THAT SHOULD BE REPORTED IMMEDIATELY:  *FEVER GREATER THAN 100.5 F  *CHILLS WITH OR WITHOUT FEVER  NAUSEA AND VOMITING THAT IS NOT CONTROLLED WITH YOUR NAUSEA MEDICATION  *UNUSUAL SHORTNESS OF BREATH  *UNUSUAL BRUISING OR BLEEDING  TENDERNESS IN MOUTH AND THROAT WITH OR WITHOUT PRESENCE OF ULCERS  *URINARY PROBLEMS  *BOWEL PROBLEMS  UNUSUAL RASH Items with * indicate a potential emergency and should be followed up as soon as possible.  Feel free to call the clinic you have any questions or concerns. The clinic phone number is [phone].  Please show the [facility] at check-in to the Emergency Department and triage nurse.

## 2016-02-12 NOTE — Progress Notes (Signed)
[facility]  [phone] [phone]     ID: DAENA ALPER DOB: 11/19/1975  MR#: [id number]  BBC#:[id number]  Patient Care Team: No Pcp Per Patient as PCP - General (General Practice) [name], MD as Consulting Physician (Oncology) [name], MD as Consulting Physician (General Surgery) [name], MD as Consulting Physician (Obstetrics and Gynecology) PCP: No PCP Per Patient OTHER MD:  CHIEF COMPLAINT: Estrogen receptor positive breast cancer  CURRENT TREATMENT: neoadjuvant chemotherapy and anti-HER-2 immunotherapy   BREAST CANCER HISTORY: From the original intake note:  Lynnet woke up the morning of 11/09/2015 with some pain in her left axilla. She examined herself and found a lump in her left breast. She saw her gynecologist the same day, and she confirms a lump. The patient then proceeded directly to mammography. I do not have that report. However it did show the mass. [name] was then scheduled for ultrasound-guided biopsy 11/21/2015. This showed ([place] [phone] at the [facility] school [facility] medicine) and invasive ductal carcinoma, grade 2 estrogen receptor positive, with moderate intensity (10-50%), progesterone receptor negative, and HER-2 equivocal by immunohistochemistry. Fish was obtained and was equivocal as well, with a signals ratio of 1.8 to, the number per cell being 5.7.  On 12/05/2015 the patient had a CT/ angiogram of the chest for evaluation of her left-sided chest pain. This showed no evidence of a clot. The left breast nodule measured 1.3 cm on the study. There was no enlarged axillary adenopathy. There was also no evidence of lung metastasis or blastic or destructive lytic bone lesions. The upper abdomen was unremarkable.  With that information the patient presents for further evaluation and treatment.  INTERVAL HISTORY: [name] returns today for follow-up of her breast cancer accompanied by her  sister Estill Bamberg. Today is day 1 cycle 3 of 6 planned doses of carboplatin, docetaxel, trastuzumab and pertuzumab given every 21 days, to be followed by trastuzumab alone for one year.  REVIEW OF SYSTEMS: [name] is very anxious and had many questions today. She has had some blurred vision. She's had a couple of migraines. Her blood pressure has been elevated and when that happened she tends to get headaches. She has a rash in both feet, particularly on the sides of the feet and heels, which is itching and peeling. She is starting to have hot flashes. Her nails are changing color and one toenail is currently not. She feels forgetful and foggy headed. Her stomach feels tight blood she is retaining water. She wonders if she should have her ovaries removed at some point. She still has pain in the breast which is very intermittent. Sometimes she is short of breath particularly when walking upstairs. She still has diarrhea perhaps once or twice a day at times. She has some back and joint pain which is not more intense or persistent than before. A detailed review of systems today was otherwise stable.  PAST MEDICAL HISTORY: Past Medical History:  Diagnosis Date  . Allergy   . Anxiety   . Asthma    childhood   . Breast cancer ([place]) 11/20/15   left breast  . Depression   . Endometriosis   . Family history of breast cancer   . GERD (gastroesophageal reflux disease)   . Herniated disc, cervical   . History of bronchitis as a child   . History of stomach ulcers   . History of urinary tract infection   . Hormone disorder   . IBD (inflammatory  bowel disease)   . Migraine without aura   . Neck pain   . PONV (postoperative nausea and vomiting)   . Shortness of breath dyspnea    anxiety     PAST SURGICAL HISTORY: Past Surgical History:  Procedure Laterality Date  . ABDOMINAL HYSTERECTOMY     Still has ovaries  . PORTACATH PLACEMENT Right 12/25/2015   Procedure: INSERTION PORT-A-CATH WITH ULTRA SOUND;   Surgeon: [name], MD;  Location: [facility];  Service: General;  Laterality: Right;    FAMILY HISTORY Family History  Problem Relation Age of Onset  . Anesthesia problems Neg Hx   . Hypotension Neg Hx   . Malignant hyperthermia Neg Hx   . Pseudochol deficiency Neg Hx   . Sarcoidosis Mother   . Hypertension Father   . Throat cancer Maternal Grandfather     smoker and heavy drinker; dx in his late 37s-50s  . Cancer Paternal Grandmother     possible gatric vs bladder cancer  . Bladder Cancer Paternal Grandmother   . Breast cancer Paternal Aunt     dxin her 50s; dad's maternal half sister  . Breast cancer Other     PGFs mother  The patient's parents are both living, in their early 53s. The patient has one brother and one sister. On the father's side 1 great grandmother was diagnosed with breast cancer in her 81s. The patient's father's mother was diagnosed with stomach cancer. One of the patient's father's sisters was diagnosed with breast cancer in her 71s. On the maternal side patient's mother's father was diagnosed with throat cancer at age.   GYNECOLOGIC HISTORY:  No LMP recorded. Patient has had a hysterectomy.  menarche age 66, first live birth age 45. The patient is GX P1. She stopped having periods in 2008, when she underwent a simple hysterectomy without salpingo-oophorectomy. She has a history of endometriosis and was started on Provera in March of this year, with her next dose due next week. (However she has decided to forego further Provera treatments at least for now).   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 He is licensed in [place] but not in [place]. He is now in this area looking for a job. The patient's son [name] is a Ship broker in music at [employer]: Not in place   HEALTH  MAINTENANCE: Social History  Substance Use Topics  . Smoking status: Never Smoker  . Smokeless tobacco: Never Used  . Alcohol use 0.0 oz/week     Comment: socially      Colonoscopy:  PAP:  Bone density:  Lipid panel:  Allergies  Allergen Reactions  . Amoxicillin Shortness Of Breath and Itching    Has patient had a PCN reaction causing immediate rash, facial/tongue/throat swelling, SOB or lightheadedness with hypotension: Yes Has patient had a PCN reaction causing severe rash involving mucus membranes or skin necrosis: No Has patient had a PCN reaction that required hospitalization Yes Has patient had a PCN reaction occurring within the last 10 years: No If all of the above answers are "NO", then may proceed with Cephalosporin use.     Current Outpatient Prescriptions  Medication Sig Dispense Refill  . dexamethasone (DECADRON) 4 MG tablet Take 2 tablets (8 mg total) by mouth 2 (two) times daily. Start the day before Taxotere.  Then again the day after chemo for 3 days. 30 tablet 1  . doxycycline (VIBRA-TABS) 100 MG tablet Take 1 tablet (100 mg total) by mouth daily. 60 tablet 1  . lidocaine-prilocaine (EMLA) cream Apply to affected area once 30 g 3  . LORazepam (ATIVAN) 0.5 MG tablet Take 1 tablet (0.5 mg total) by mouth at bedtime as needed (Nausea or vomiting). 30 tablet 0  . prochlorperazine (COMPAZINE) 10 MG tablet Take 1 tablet (10 mg total) by mouth every 6 (six) hours as needed (Nausea or vomiting). 30 tablet 1  . traMADol (ULTRAM) 50 MG tablet Take 1 tablet (50 mg total) by mouth every 6 (six) hours as needed for moderate pain. 60 tablet 1  . valACYclovir (VALTREX) 500 MG tablet Take 1 tablet (500 mg total) by mouth daily. 60 tablet 1  . venlafaxine (EFFEXOR) 37.5 MG tablet Take 1 tablet (37.5 mg total) by mouth 2 (two) times daily. 30 tablet 0  . zolpidem (AMBIEN) 10 MG tablet Take 1/2 to 1 tab po qhs prn (Patient taking differently: Take 5-10 mg by mouth at bedtime as  needed for sleep. Take 1/2 to 1 tab po qhs prn) 30 tablet 0   No current facility-administered medications for this visit.     OBJECTIVE: Early middle-aged African-American woman  Starlyn Skeans Vitals:   02/12/16 0935  BP: (!) 161/106  Pulse: (!) 105  Resp: 18  Temp: 98.3 F (36.8 C)     Body mass index is 32.79 kg/m.    ECOG FS:1 - Symptomatic but completely ambulatory Filed Weights   02/12/16 0935  Weight: 191 lb (86.6 kg)    Sclerae unicteric, pupils round and equal Oropharynx clear and moist-- no thrush or other lesions No cervical or supraclavicular adenopathy Lungs no rales or rhonchi Heart regular rate and rhythm Abd soft, nontender, positive bowel sounds MSK no focal spinal tenderness, no upper extremity lymphedema Neuro: nonfocal, well oriented, appropriate affect Breasts: The right breast is unremarkable. I do not palpate a mass in the left breast. There are no nipple or skin changes of concern. Left axilla is benign. Her of her   LAB RESULTS:  CMP     Component Value Date/Time   NA 141 01/22/2016 0851   K 3.9 01/22/2016 0851   CL 107 12/04/2015 1858   CO2 22 01/22/2016 0851   GLUCOSE 146 (H) 01/22/2016 0851   BUN 10.9 01/22/2016 0851   CREATININE 0.8 01/22/2016 0851   CALCIUM 9.7 01/22/2016 0851   PROT 7.3 01/22/2016 0851   ALBUMIN 3.7 01/22/2016 0851   AST 18 01/22/2016 0851   ALT 27 01/22/2016 0851   ALKPHOS 70 01/22/2016 0851   BILITOT 0.34 01/22/2016 0851   GFRNONAA >60 12/04/2015 1858   GFRAA >60 12/04/2015 1858    INo results found for: SPEP, UPEP  Lab Results  Component Value Date   WBC 14.6 (H) 02/12/2016   NEUTROABS 13.3 (H) 02/12/2016   HGB 10.6 (L) 02/12/2016   HCT 31.4 (L) 02/12/2016   MCV 90.2 02/12/2016   PLT 282 02/12/2016      Chemistry      Component Value Date/Time   NA 141 01/22/2016 0851   K 3.9 01/22/2016 0851   CL 107 12/04/2015 1858   CO2 22 01/22/2016 0851   BUN 10.9 01/22/2016 0851   CREATININE 0.8  01/22/2016 0851      Component Value Date/Time   CALCIUM 9.7 01/22/2016 0851   ALKPHOS 70 01/22/2016 0851   AST  18 01/22/2016 0851   ALT 27 01/22/2016 0851   BILITOT 0.34 01/22/2016 0851       No results found for: LABCA2  No components found for: RWERX540  No results for input(s): INR in the last 168 hours.  Urinalysis    Component Value Date/Time   COLORURINE STRAW (A) 12/11/2008 1923   APPEARANCEUR CLEAR 12/11/2008 1923   LABSPEC 1.020 12/11/2008 1923   PHURINE 6.0 12/11/2008 1923   GLUCOSEU NEGATIVE 12/11/2008 1923   HGBUR NEGATIVE 12/11/2008 1923   BILIRUBINUR NEGATIVE 12/11/2008 1923   KETONESUR NEGATIVE 12/11/2008 1923   PROTEINUR NEGATIVE 12/11/2008 1923   UROBILINOGEN 0.2 12/11/2008 1923   NITRITE NEGATIVE 12/11/2008 1923   LEUKOCYTESUR  12/11/2008 1923    NEGATIVE MICROSCOPIC NOT DONE ON URINES WITH NEGATIVE PROTEIN, BLOOD, LEUKOCYTES, NITRITE, OR GLUCOSE <1000 mg/dL.      ELIGIBLE FOR AVAILABLE RESEARCH PROTOCOL: no  STUDIES: No results found.  ASSESSMENT: 40 y.o. [place] woman status post left breast upper outer quadrant biopsy 11/20/2015 for a clinical T1c N0, stage IA invasive ductal carcinoma, grade 2, estrogen receptor moderately positive, progesterone receptor negative, HER-2 equivocal by both immunohistochemistry and FISH  (1) neoadjuvant tamoxifen started 12/11/2015, stopped at the start of chemotherapy  (2) genetics testing 01/03/2016 through the Breast/Ovarian gene panel offered by [facility] found no deleterious mutations in ATM, BARD1, BRCA1, BRCA2, BRIP1, CDH1, CHEK2, EPCAM, FANCC, MLH1, MSH2, MSH6, NBN, PALB2, PMS2, PTEN, RAD51C, RAD51D, TP53, and XRCC2  (3) neoadjuvant chemotherapy consisting of carboplatin, docetaxel, trastuzumab and pertuzumab every 21 days 6 starting 01/01/2016  (4) definitive surgery to follow  (5) adjuvant radiation to follow surgery   (6) anti-estrogens to be continued 5-10 years, more likely the latter     PLAN: [name] is generally tolerating chemotherapy well. We are proceeding with the third cycle today.  She wondered why we needed to go to 6 cycles since I no longer palpate her breast mass. We discussed occult disease and the fact that she needs to kill the very last cancer cell and her body and that the only way we can optimize that possibility is to follow the current recipe which is 6 cycles.  I do think she is on the way to a likely complete pathologic response. That will be very reassuring to her.  She wonders if she should have her ovaries removed. She really had her uterus removed remotely. I have no problems with that of course but that would have to wait until she is done with the current treatment and surgery.  I reassured her that having discolored nails and even losing and nail is expected. She is also having menopausal symptoms including insomnia, hot flashes, and weight gain. She is going to have to work on her diet and I specifically suggest that she cut back on carbohydrates and also on salt.  She will see me again in 3 weeks with her fourth cycle. She knows to call for any problems that may develop before then.     [name], MD   02/12/2016 9:57 AM Medical Oncology and Hematology [facility] [address] [place], [place] [address] Tel. [phone]    Fax. [phone]

## 2016-02-12 NOTE — Telephone Encounter (Signed)
appt made and avs printed °

## 2016-02-14 ENCOUNTER — Ambulatory Visit: Payer: BLUE CROSS/BLUE SHIELD

## 2016-02-27 ENCOUNTER — Ambulatory Visit (HOSPITAL_COMMUNITY)
Admission: RE | Admit: 2016-02-27 | Discharge: 2016-02-27 | Disposition: A | Discharge status: Home / Self Care | Payer: BLUE CROSS/BLUE SHIELD | Source: Ambulatory Visit | Attending: Obstetrics and Gynecology | Admitting: Obstetrics and Gynecology

## 2016-02-27 ENCOUNTER — Telehealth: Payer: Self-pay

## 2016-02-27 ENCOUNTER — Encounter: Payer: Self-pay | Admitting: Obstetrics and Gynecology

## 2016-02-27 ENCOUNTER — Other Ambulatory Visit: Payer: Self-pay | Admitting: Obstetrics and Gynecology

## 2016-02-27 ENCOUNTER — Ambulatory Visit (INDEPENDENT_AMBULATORY_CARE_PROVIDER_SITE_OTHER): Payer: BLUE CROSS/BLUE SHIELD | Admitting: Obstetrics and Gynecology

## 2016-02-27 VITALS — BP 132/80 | HR 84 | Resp 14 | Wt 194.0 lb

## 2016-02-27 DIAGNOSIS — Z90710 Acquired absence of both cervix and uterus: Secondary | ICD-10-CM | POA: Diagnosis not present

## 2016-02-27 DIAGNOSIS — Z853 Personal history of malignant neoplasm of breast: Secondary | ICD-10-CM | POA: Diagnosis not present

## 2016-02-27 DIAGNOSIS — Z5189 Encounter for other specified aftercare: Secondary | ICD-10-CM | POA: Diagnosis not present

## 2016-02-27 DIAGNOSIS — R102 Pelvic and perineal pain: Secondary | ICD-10-CM | POA: Diagnosis not present

## 2016-02-27 DIAGNOSIS — N838 Other noninflammatory disorders of ovary, fallopian tube and broad ligament: Secondary | ICD-10-CM | POA: Diagnosis not present

## 2016-02-27 LAB — CBC WITH DIFFERENTIAL/PLATELET
Basophils Absolute: 79 cells/uL (ref 0–200)
Basophils Relative: 1 %
Eosinophils Absolute: 0 cells/uL — ABNORMAL LOW (ref 15–500)
Eosinophils Relative: 0 %
HCT: 29.9 % — ABNORMAL LOW (ref 35.0–45.0)
Hemoglobin: 9.9 g/dL — ABNORMAL LOW (ref 11.7–15.5)
Lymphocytes Relative: 37 %
Lymphs Abs: 2923 cells/uL (ref 850–3900)
MCH: 30 pg (ref 27.0–33.0)
MCHC: 33.1 g/dL (ref 32.0–36.0)
MCV: 90.6 fL (ref 80.0–100.0)
MPV: 9.9 fL (ref 7.5–12.5)
Monocytes Absolute: 1106 cells/uL — ABNORMAL HIGH (ref 200–950)
Monocytes Relative: 14 %
Neutro Abs: 3792 cells/uL (ref 1500–7800)
Neutrophils Relative %: 48 %
Platelets: 222 10*3/uL (ref 140–400)
RBC: 3.3 MIL/uL — ABNORMAL LOW (ref 3.80–5.10)
RDW: 17.4 % — ABNORMAL HIGH (ref 11.0–15.0)
WBC: 7.9 10*3/uL (ref 3.8–10.8)

## 2016-02-27 MED ORDER — OXYCODONE-ACETAMINOPHEN 5-325 MG PO TABS: 1.0000 | ORAL_TABLET | ORAL | 0 refills | Status: DC | PRN

## 2016-02-27 NOTE — Progress Notes (Signed)
Scheduled patient while in office for patient to be seen for STAT imaging at [facility] for [place] pelvis complete and PUS today at 3 pm. [facility] to call report after imaging has been performed. Patient sent directly to [facility] long for imaging.

## 2016-02-27 NOTE — Telephone Encounter (Signed)
Spoke with patient's mother Jeanett Schlein at time of incoming call, okay per ROI. Jeanett Schlein states that the patient is currently under treatment for breast cancer. Patient woke up this morning with severe pelvic pain. Mother is asking if I can contact the patient to discuss if she needs to be seen in the ER or if she needs to see Dr.Jertson. Call to patient. Patient reports she woke up this morning and is having intermittent sharp stabbing in her left lower pelvic area close to her vagina. Denies any vaginal bleeding, fever, or chills. Patient has had a partial hysterectomy and ovaries remain. Has not taken any medication for pain at this time. States that she takes Tramadol daily for pain with breast cancer and is about to take one with breakfast. Patient states she is very concerned due to her breast cancer that she may have something going on with her pelvic region or ovaries. Advised she will need to be seen in the office for further evaluation. She is agreeable. Appointment scheduled for today at 1:45 pm with Dr.Jertson. She is agreeable to date and time. Advised I will speak with Dr.Jertson and return call with any additional recommendations. She is agreeable.

## 2016-02-27 NOTE — Telephone Encounter (Signed)
Yes, thank you.

## 2016-02-27 NOTE — Telephone Encounter (Signed)
Spoke with patient. Advised I have reviewed PUS imaging with Dr.Jertson which relieves a simple cyst of her right ovary. No torsion. Advised she may continue taking 600-800 mg of Ibuprofen or Motrin every 6- 8 hours as needed for discomfort. Advised if symptoms worsen she will need to be seen in the office for further evaluation. Advised per Dr.Jertson will need recheck in the office next week as long as she is feeling better. Patient is agreeable. Appointment scheduled for 03/06/2016 at 10 am with Dr.Jertson. Patient is agreeable to date and time.  Dr.Jertson, do you agree with these recommendations?

## 2016-02-27 NOTE — Progress Notes (Signed)
GYNECOLOGY  VISIT   HPI: 40 y.o.   Married  [place] American  female   G1P1001 with No LMP recorded. Patient has had a hysterectomy.   here c/o pelvic pain that started around 0400 this am. The pain woke her up. It is intermittent in the LLQ, radiating down to her groin. The pain is sharp lasts for a few minutes at a time. Up to a 10/10 in severity. No fevers, she has chronic diarrhea from chemo. No urinary urgency, frequency or pain.  She has a h/o a hysterectomy, still has her ovaries. She is currently undergoing chemotherapy for breast cancer, definitive surgery to follow. Last chemotherapy was on 02/12/16.  GYNECOLOGIC HISTORY: No LMP recorded. Patient has had a hysterectomy. Contraception:hyterectomy Menopausal hormone therapy: none         OB History    Gravida Para Term Preterm AB Living   1 1 1     1    SAB TAB Ectopic Multiple Live Births                     Patient Active Problem List   Diagnosis Date Noted  . Genetic testing 01/07/2016  . Family history of breast cancer   . Breast cancer of upper-outer quadrant of left female breast ([place]) 12/11/2015    Past Medical History:  Diagnosis Date  . Allergy   . Anxiety   . Asthma    childhood   . Breast cancer ([place]) 11/20/15   left breast  . Depression   . Endometriosis   . Family history of breast cancer   . GERD (gastroesophageal reflux disease)   . Herniated disc, cervical   . History of bronchitis as a child   . History of stomach ulcers   . History of urinary tract infection   . Hormone disorder   . IBD (inflammatory bowel disease)   . Migraine without aura   . Neck pain   . PONV (postoperative nausea and vomiting)   . Shortness of breath dyspnea    anxiety     Past Surgical History:  Procedure Laterality Date  . ABDOMINAL HYSTERECTOMY     Still has ovaries  . PORTACATH PLACEMENT Right 12/25/2015   Procedure: INSERTION PORT-A-CATH WITH ULTRA SOUND;  Surgeon: [name], MD;  Location: [facility];   Service: General;  Laterality: Right;    Current Outpatient Prescriptions  Medication Sig Dispense Refill  . dexamethasone (DECADRON) 4 MG tablet Take 2 tablets (8 mg total) by mouth 2 (two) times daily. Start the day before Taxotere. Then again the day after chemo for 3 days. 30 tablet 1  . doxycycline (VIBRA-TABS) 100 MG tablet Take 1 tablet (100 mg total) by mouth daily. 60 tablet 1  . lidocaine-prilocaine (EMLA) cream Apply to affected area once 30 g 3  . LORazepam (ATIVAN) 0.5 MG tablet Take 1 tablet (0.5 mg total) by mouth at bedtime as needed (Nausea or vomiting). 30 tablet 0  . prochlorperazine (COMPAZINE) 10 MG tablet Take 1 tablet (10 mg total) by mouth every 6 (six) hours as needed (Nausea or vomiting). 30 tablet 1  . sertraline (ZOLOFT) 100 MG tablet Take 1 tablet (100 mg total) by mouth daily. 30 tablet 3  . traMADol (ULTRAM) 50 MG tablet Take 1 tablet (50 mg total) by mouth every 6 (six) hours as needed for moderate pain. 60 tablet 1  . valACYclovir (VALTREX) 500 MG tablet Take 1 tablet (500 mg total) by mouth daily. 60 tablet  1  . zolpidem (AMBIEN) 10 MG tablet Take 1/2 to 1 tab po qhs prn 30 tablet 0   No current facility-administered medications for this visit.      ALLERGIES: Amoxicillin  Family History  Problem Relation Age of Onset  . Anesthesia problems Neg Hx   . Hypotension Neg Hx   . Malignant hyperthermia Neg Hx   . Pseudochol deficiency Neg Hx   . Sarcoidosis Mother   . Hypertension Father   . Throat cancer Maternal Grandfather     smoker and heavy drinker; dx in his late 69s-50s  . Cancer Paternal Grandmother     possible gatric vs bladder cancer  . Bladder Cancer Paternal Grandmother   . Breast cancer Paternal Aunt     dxin her 10s; dad's maternal half sister  . Breast cancer Other     PGFs mother    Social History   Social History  . Marital status: Married    Spouse name: N/A  . Number of children: 1  . Years of education: N/A    Occupational History  . Not on file.   Social History Main Topics  . Smoking status: Never Smoker  . Smokeless tobacco: Never Used  . Alcohol use 0.0 oz/week     Comment: socially   . Drug use: No  . Sexual activity: Yes    Partners: Male   Other Topics Concern  . Not on file   Social History Narrative  . No narrative on file    Review of Systems  Constitutional: Negative.   HENT: Negative.   Eyes: Negative.   Respiratory: Negative.   Cardiovascular: Negative.   Gastrointestinal: Negative.   Genitourinary:       Pelvic pain  Musculoskeletal: Negative.   Skin: Negative.   Neurological: Negative.   Endo/Heme/Allergies: Negative.   Psychiatric/Behavioral: Negative.     PHYSICAL EXAMINATION:    BP 132/80 (BP Location: Right Arm, Patient Position: Sitting, Cuff Size: Normal)   Pulse 84   Resp 14   Wt 194 lb (88 kg)   BMI 33.30 kg/m     General appearance: alert, cooperative and appears stated age Abdomen: soft, mildly tender in the LLQ, no rebound or guarding; bowel sounds slightly hyperactive; no masses,  no organomegaly  Pelvic: External genitalia:  no lesions              Urethra:  normal appearing urethra with no masses, tenderness or lesions              Bartholins and Skenes: normal                 Vagina: normal appearing vagina with normal color and discharge, no lesions              Cervix: absent              Bimanual Exam:  Uterus:  uterus absent              Adnexa: no masses, tender on the left              Rectovaginal: Yes.  .  Confirms.              Anus:  normal sphincter tone, no lesions Pelvic floor: bilateral mild tenderness  Chaperone was present for exam.  ASSESSMENT Pelvic pain on the left, sudden onset at 4 am. She does not have an acute abdomen    PLAN Recommended ultrasound, she declines going to the  ER If we can't get her in for an ultrasound today, will get one here tomorrow CBC with diff Percocet for breakthrough pain, she  will take ibuprofen around the clock Instructed to call with worsening pain, f/u depending on her ultrasound results   An After Visit Summary was printed and given to the patient.

## 2016-02-28 ENCOUNTER — Telehealth: Payer: Self-pay | Admitting: Obstetrics and Gynecology

## 2016-02-28 NOTE — Telephone Encounter (Signed)
Patient was seen in office yesterday 02/27/2016 with Dr.Jertson. Please see OV note. Patient is returning call to [facility] regarding results. Please see result note dated 02/27/2016.  Routing to provider for final review. Patient agreeable to disposition. Will close encounter.

## 2016-02-28 NOTE — Telephone Encounter (Signed)
Patient says she is returning a call to [place]? No open tc note and Margaretha Sheffield is out of the office this afternoon and tomorrow.

## 2016-02-28 NOTE — Telephone Encounter (Signed)
Return call to Kaitlyn. °

## 2016-02-28 NOTE — Telephone Encounter (Signed)
Dr.Jertson called patient personally to discuss results.  Dr.Jertson, okay to close encounter?

## 2016-02-28 NOTE — Telephone Encounter (Signed)
Spoke with the patient, she is feeling a little better today, the pain medication is making her pain tolerable. I reviewed her normal WBC, she is anemic, I suspect from her chemotherapy. She has a f/u visit next week, will call prior to that if having worsening pain.

## 2016-03-03 ENCOUNTER — Other Ambulatory Visit: Payer: Self-pay | Admitting: *Deleted

## 2016-03-03 DIAGNOSIS — C50412 Malignant neoplasm of upper-outer quadrant of left female breast: Secondary | ICD-10-CM

## 2016-03-04 ENCOUNTER — Other Ambulatory Visit (HOSPITAL_BASED_OUTPATIENT_CLINIC_OR_DEPARTMENT_OTHER): Payer: BLUE CROSS/BLUE SHIELD

## 2016-03-04 ENCOUNTER — Ambulatory Visit (HOSPITAL_BASED_OUTPATIENT_CLINIC_OR_DEPARTMENT_OTHER): Payer: BLUE CROSS/BLUE SHIELD | Admitting: Oncology

## 2016-03-04 ENCOUNTER — Ambulatory Visit (HOSPITAL_BASED_OUTPATIENT_CLINIC_OR_DEPARTMENT_OTHER): Payer: BLUE CROSS/BLUE SHIELD

## 2016-03-04 VITALS — BP 160/97 | HR 99 | Temp 98.6°F | Resp 18 | Ht 64.0 in | Wt 195.7 lb

## 2016-03-04 DIAGNOSIS — Z170 Estrogen receptor positive status [ER+]: Secondary | ICD-10-CM | POA: Diagnosis not present

## 2016-03-04 DIAGNOSIS — Z5112 Encounter for antineoplastic immunotherapy: Secondary | ICD-10-CM | POA: Diagnosis not present

## 2016-03-04 DIAGNOSIS — Z5189 Encounter for other specified aftercare: Secondary | ICD-10-CM

## 2016-03-04 DIAGNOSIS — C50412 Malignant neoplasm of upper-outer quadrant of left female breast: Secondary | ICD-10-CM

## 2016-03-04 DIAGNOSIS — Z5111 Encounter for antineoplastic chemotherapy: Secondary | ICD-10-CM

## 2016-03-04 LAB — CBC WITH DIFFERENTIAL/PLATELET
BASO%: 0 % (ref 0.0–2.0)
Basophils Absolute: 0 10*3/uL (ref 0.0–0.1)
EOS%: 0 % (ref 0.0–7.0)
Eosinophils Absolute: 0 10*3/uL (ref 0.0–0.5)
HCT: 29.6 % — ABNORMAL LOW (ref 34.8–46.6)
HGB: 10.1 g/dL — ABNORMAL LOW (ref 11.6–15.9)
LYMPH%: 8.4 % — ABNORMAL LOW (ref 14.0–49.7)
MCH: 30.3 pg (ref 25.1–34.0)
MCHC: 34.1 g/dL (ref 31.5–36.0)
MCV: 88.9 fL (ref 79.5–101.0)
MONO#: 0.8 10*3/uL (ref 0.1–0.9)
MONO%: 6.5 % (ref 0.0–14.0)
NEUT#: 10.7 10*3/uL — ABNORMAL HIGH (ref 1.5–6.5)
NEUT%: 85.1 % — ABNORMAL HIGH (ref 38.4–76.8)
Platelets: 225 10*3/uL (ref 145–400)
RBC: 3.33 10*6/uL — ABNORMAL LOW (ref 3.70–5.45)
RDW: 16.6 % — ABNORMAL HIGH (ref 11.2–14.5)
WBC: 12.6 10*3/uL — ABNORMAL HIGH (ref 3.9–10.3)
lymph#: 1.1 10*3/uL (ref 0.9–3.3)

## 2016-03-04 LAB — COMPREHENSIVE METABOLIC PANEL
ALT: 31 U/L (ref 0–55)
AST: 22 U/L (ref 5–34)
Albumin: 3.6 g/dL (ref 3.5–5.0)
Alkaline Phosphatase: 65 U/L (ref 40–150)
Anion Gap: 13 mEq/L — ABNORMAL HIGH (ref 3–11)
BUN: 11.3 mg/dL (ref 7.0–26.0)
CO2: 23 mEq/L (ref 22–29)
Calcium: 10 mg/dL (ref 8.4–10.4)
Chloride: 108 mEq/L (ref 98–109)
Creatinine: 0.9 mg/dL (ref 0.6–1.1)
EGFR: >90 mL/min/{1.73_m2} (ref >90)
Glucose: 189 mg/dl — ABNORMAL HIGH (ref 70–140)
Potassium: 3.9 mEq/L (ref 3.5–5.1)
Sodium: 143 mEq/L (ref 136–145)
Total Bilirubin: 0.39 mg/dL (ref 0.20–1.20)
Total Protein: 7.1 g/dL (ref 6.4–8.3)

## 2016-03-04 MED ORDER — ACETAMINOPHEN 325 MG PO TABS
650.0000 mg | ORAL_TABLET | Freq: Once | ORAL | Status: AC
  Administered 2016-03-04: 650 mg via ORAL

## 2016-03-04 MED ORDER — HEPARIN SOD (PORK) LOCK FLUSH 100 UNIT/ML IV SOLN
500.0000 [IU] | Freq: Once | INTRAVENOUS | Status: AC | PRN
  Administered 2016-03-04: 500 [IU]
  Filled 2016-03-04: qty 5

## 2016-03-04 MED ORDER — PEGFILGRASTIM 6 MG/0.6ML ~~LOC~~ PSKT
6.0000 mg | PREFILLED_SYRINGE | Freq: Once | SUBCUTANEOUS | Status: AC
  Administered 2016-03-04: 6 mg via SUBCUTANEOUS
  Filled 2016-03-04: qty 0.6

## 2016-03-04 MED ORDER — PALONOSETRON HCL INJECTION 0.25 MG/5ML
INTRAVENOUS | Status: AC
  Filled 2016-03-04: qty 5

## 2016-03-04 MED ORDER — SODIUM CHLORIDE 0.9 % IV SOLN
75.0000 mg/m2 | Freq: Once | INTRAVENOUS | Status: AC
  Administered 2016-03-04: 140 mg via INTRAVENOUS
  Filled 2016-03-04: qty 14

## 2016-03-04 MED ORDER — DIPHENHYDRAMINE HCL 25 MG PO CAPS
ORAL_CAPSULE | ORAL | Status: AC
  Filled 2016-03-04: qty 2

## 2016-03-04 MED ORDER — PALONOSETRON HCL INJECTION 0.25 MG/5ML
0.2500 mg | Freq: Once | INTRAVENOUS | Status: AC
  Administered 2016-03-04: 0.25 mg via INTRAVENOUS

## 2016-03-04 MED ORDER — TRASTUZUMAB CHEMO 150 MG IV SOLR
6.0000 mg/kg | Freq: Once | INTRAVENOUS | Status: AC
  Administered 2016-03-04: 504 mg via INTRAVENOUS
  Filled 2016-03-04: qty 24

## 2016-03-04 MED ORDER — DEXAMETHASONE 4 MG PO TABS: 4.0000 mg | ORAL_TABLET | Freq: Two times a day (BID) | ORAL | 1 refills | Status: DC

## 2016-03-04 MED ORDER — SODIUM CHLORIDE 0.9 % IV SOLN
420.0000 mg | Freq: Once | INTRAVENOUS | Status: AC
  Administered 2016-03-04: 420 mg via INTRAVENOUS
  Filled 2016-03-04: qty 14

## 2016-03-04 MED ORDER — SODIUM CHLORIDE 0.9 % IV SOLN
10.0000 mg | Freq: Once | INTRAVENOUS | Status: AC
  Administered 2016-03-04: 10 mg via INTRAVENOUS
  Filled 2016-03-04: qty 1

## 2016-03-04 MED ORDER — SODIUM CHLORIDE 0.9 % IV SOLN
656.0000 mg | Freq: Once | INTRAVENOUS | Status: AC
  Administered 2016-03-04: 660 mg via INTRAVENOUS
  Filled 2016-03-04: qty 66

## 2016-03-04 MED ORDER — DIPHENHYDRAMINE HCL 25 MG PO CAPS
25.0000 mg | ORAL_CAPSULE | Freq: Once | ORAL | Status: AC
  Administered 2016-03-04: 25 mg via ORAL

## 2016-03-04 MED ORDER — SODIUM CHLORIDE 0.9 % IV SOLN
Freq: Once | INTRAVENOUS | Status: AC
  Administered 2016-03-04: 10:00:00 via INTRAVENOUS

## 2016-03-04 MED ORDER — ACETAMINOPHEN 325 MG PO TABS
ORAL_TABLET | ORAL | Status: AC
Start: 2016-03-04 — End: 2016-03-04
  Filled 2016-03-04: qty 2

## 2016-03-04 MED ORDER — SODIUM CHLORIDE 0.9 % IV SOLN: Freq: Once | INTRAVENOUS | Status: DC

## 2016-03-04 MED ORDER — SODIUM CHLORIDE 0.9% FLUSH
10.0000 mL | INTRAVENOUS | Status: DC | PRN
  Administered 2016-03-04: 10 mL
  Filled 2016-03-04: qty 10

## 2016-03-04 NOTE — Progress Notes (Signed)
This is [facility]. [facility]  [phone] [phone]     ID: [name] DOB: [birth year]  MR#: [id number]  VPX#:[id number]  Patient Care Team: No Pcp Per Patient as PCP - General (General Practice) [name], MD as Consulting Physician (Oncology) [name], MD as Consulting Physician (General Surgery) [name], MD as Consulting Physician (Obstetrics and Gynecology) PCP: No PCP Per Patient OTHER MD:  CHIEF COMPLAINT: Estrogen receptor positive breast cancer  CURRENT TREATMENT: neoadjuvant chemotherapy and anti-HER-2 immunotherapy   BREAST CANCER HISTORY: From the original intake note:  [name] woke up the morning of 11/09/2015 with some pain in her left axilla. She examined herself and found a lump in her left breast. She saw her gynecologist the same day, and she confirms a lump. The patient then proceeded directly to mammography. I do not have that report. However it did show the mass. [name] was then scheduled for ultrasound-guided biopsy 11/21/2015. This showed ([place] [phone] at the [facility]) and invasive ductal carcinoma, grade 2 estrogen receptor positive, with moderate intensity (10-50%), progesterone receptor negative, and HER-2 equivocal by immunohistochemistry. Fish was obtained and was equivocal as well, with a signals ratio of 1.8 to, the number per cell being 5.7.  On 12/05/2015 the patient had a CT/ angiogram of the chest for evaluation of her left-sided chest pain. This showed no evidence of a clot. The left breast nodule measured 1.3 cm on the study. There was no enlarged axillary adenopathy. There was also no evidence of lung metastasis or blastic or destructive lytic bone lesions. The upper abdomen was unremarkable.  With that information the patient presents for further evaluation and treatment.  INTERVAL HISTORY: [name] returns today for follow-up of her HER-2  positive breast cancer. Today is day 1 cycle 4 of 6 planned doses of carboplatin, docetaxel, trastuzumab and pertuzumab given every 21 days, to be followed by trastuzumab alone for one year.  [name] to the support group yesterday and "had a breakdown", chiefly because of family issues. I got a note from the facilitator alerting me regarding this. She felt [name] was depressed anxious worried about her marriage but not suicidal. Of [name] just the diagnosis of cancer and having to receive chemotherapy in itself can be depressing  REVIEW OF SYSTEMS: [name] has some diarrhea after each cycle and she controls this with Imodium. She has had some bloating and she tells me she just had an ultrasound under her gynecologist which showed an ovarian cyst. That was very irritating but it is improving. She feels very tired. She has a little bit of a runny nose. She is deconditioned and short of breath particularly when climbing stairs. Occasionally she has a dry cough, likely related to her heartburn symptoms. She has back and joint pain. Occasionally she has headaches. She complains of a brain falx. Hot flashes are making it difficult for her to sleep. Aside from these issues of [name] there are the communication issues with her husband. These are discussed further below. A detailed review of systems today was otherwise stable.  PAST MEDICAL HISTORY: Past Medical History:  Diagnosis Date  . Allergy   . Anxiety   . Asthma    childhood   . Breast cancer ([place]) 11/20/15   left breast  . Depression   . Endometriosis   . Family history of breast cancer   . GERD (gastroesophageal reflux disease)   . Herniated disc, cervical   .  History of bronchitis as a child   . History of stomach ulcers   . History of urinary tract infection   . Hormone disorder   . IBD (inflammatory bowel disease)   . Migraine without aura   . Neck pain   . PONV (postoperative nausea and vomiting)   . Shortness of breath dyspnea     anxiety     PAST SURGICAL HISTORY: Past Surgical History:  Procedure Laterality Date  . ABDOMINAL HYSTERECTOMY     Still has ovaries  . PORTACATH PLACEMENT Right 12/25/2015   Procedure: INSERTION PORT-A-CATH WITH ULTRA SOUND;  Surgeon: [name], MD;  Location: [facility];  Service: General;  Laterality: Right;    FAMILY HISTORY Family History  Problem Relation Age of Onset  . Anesthesia problems Neg Hx   . Hypotension Neg Hx   . Malignant hyperthermia Neg Hx   . Pseudochol deficiency Neg Hx   . Sarcoidosis Mother   . Hypertension Father   . Throat cancer Maternal Grandfather     smoker and heavy drinker; dx in his late 22s-50s  . Cancer Paternal Grandmother     possible gatric vs bladder cancer  . Bladder Cancer Paternal Grandmother   . Breast cancer Paternal Aunt     dxin her 3s; dad's maternal half sister  . Breast cancer Other     PGFs mother  The patient's parents are both living, in their early 34s. The patient has one brother and one sister. On the father's side 1 great grandmother was diagnosed with breast cancer in her 30s. The patient's father's mother was diagnosed with stomach cancer. One of the patient's father's sisters was diagnosed with breast cancer in her 29s. On the maternal side patient's mother's father was diagnosed with throat cancer at age.   GYNECOLOGIC HISTORY:  No LMP recorded. Patient has had a hysterectomy.  menarche age 67, first live birth age 26. The patient is GX P1. She stopped having periods in 2008, when she underwent a simple hysterectomy without salpingo-oophorectomy. She has a history of endometriosis and was started on Provera in March of this year, with her next dose due next week. (However she has decided to forego further Provera treatments at least for now).   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 He is licensed in [place] but not in [place]. He is now in this area looking for a job. The patient's son [name] is a Ship broker in music at [employer]: Not in place   HEALTH MAINTENANCE: Social History  Substance Use Topics  . Smoking status: Never Smoker  . Smokeless tobacco: Never Used  . Alcohol use 0.0 oz/week     Comment: socially      Colonoscopy:  PAP:  Bone density:  Lipid panel:  Allergies  Allergen Reactions  . Amoxicillin Shortness Of Breath and Itching    Has patient had a PCN reaction causing immediate rash, facial/tongue/throat swelling, SOB or lightheadedness with hypotension: Yes Has patient had a PCN reaction causing severe rash involving mucus membranes or skin necrosis: No Has patient had a PCN reaction that required hospitalization Yes Has patient had a PCN reaction occurring within the last 10 years: No If all of the above answers are "NO", then may proceed with Cephalosporin use.     Current  Outpatient Prescriptions  Medication Sig Dispense Refill  . dexamethasone (DECADRON) 4 MG tablet Take 2 tablets (8 mg total) by mouth 2 (two) times daily. Start the day before Taxotere. Then again the day after chemo for 3 days. 30 tablet 1  . doxycycline (VIBRA-TABS) 100 MG tablet Take 1 tablet (100 mg total) by mouth daily. 60 tablet 1  . lidocaine-prilocaine (EMLA) cream Apply to affected area once 30 g 3  . LORazepam (ATIVAN) 0.5 MG tablet Take 1 tablet (0.5 mg total) by mouth at bedtime as needed (Nausea or vomiting). 30 tablet 0  . oxyCODONE-acetaminophen (PERCOCET) 5-325 MG tablet Take 1-2 tablets by mouth every 4 (four) hours as needed. use only as much as needed to relieve pain 20 tablet 0  . prochlorperazine (COMPAZINE) 10 MG tablet Take 1 tablet (10 mg total) by mouth every 6 (six) hours as needed (Nausea or vomiting). 30 tablet 1  . sertraline (ZOLOFT) 100 MG tablet Take 1 tablet (100 mg total) by  mouth daily. 30 tablet 3  . traMADol (ULTRAM) 50 MG tablet Take 1 tablet (50 mg total) by mouth every 6 (six) hours as needed for moderate pain. 60 tablet 1  . valACYclovir (VALTREX) 500 MG tablet Take 1 tablet (500 mg total) by mouth daily. 60 tablet 1  . zolpidem (AMBIEN) 10 MG tablet Take 1/2 to 1 tab po qhs prn 30 tablet 0   No current facility-administered medications for this visit.     OBJECTIVE: Early middle-aged African-American woman Who appears younger than stated age  31:   03/04/16 0839  BP: (!) 160/97  Pulse: 99  Resp: 18  Temp: 98.6 F (37 C)     Body mass index is 33.59 kg/m.    ECOG FS:1 - Symptomatic but completely ambulatory Filed Weights   03/04/16 0839  Weight: 195 lb 11.2 oz (88.8 kg)    Sclerae unicteric, EOMs intact Oropharynx clear and moist No cervical or supraclavicular adenopathy Lungs no rales or rhonchi Heart regular rate and rhythm Abd soft, nontender, positive bowel sounds, no masses palpated MSK no focal spinal tenderness, no upper extremity lymphedema Neuro: nonfocal, well oriented, appropriate affect Breasts: Deferred   LAB RESULTS:  CMP     Component Value Date/Time   NA 143 03/04/2016 0822   K 3.9 03/04/2016 0822   CL 107 12/04/2015 1858   CO2 23 03/04/2016 0822   GLUCOSE 189 (H) 03/04/2016 0822   BUN 11.3 03/04/2016 0822   CREATININE 0.9 03/04/2016 0822   CALCIUM 10.0 03/04/2016 0822   PROT 7.1 03/04/2016 0822   ALBUMIN 3.6 03/04/2016 0822   AST 22 03/04/2016 0822   ALT 31 03/04/2016 0822   ALKPHOS 65 03/04/2016 0822   BILITOT 0.39 03/04/2016 0822   GFRNONAA >60 12/04/2015 1858   GFRAA >60 12/04/2015 1858    INo results found for: SPEP, UPEP  Lab Results  Component Value Date   WBC 12.6 (H) 03/04/2016   NEUTROABS 10.7 (H) 03/04/2016   HGB 10.1 (L) 03/04/2016   HCT 29.6 (L) 03/04/2016   MCV 88.9 03/04/2016   PLT 225 03/04/2016      Chemistry      Component Value Date/Time   NA 143 03/04/2016 0822   K 3.9  03/04/2016 0822   CL 107 12/04/2015 1858   CO2 23 03/04/2016 0822   BUN 11.3 03/04/2016 0822   CREATININE 0.9 03/04/2016 0822      Component Value Date/Time   CALCIUM 10.0 03/04/2016 3382  ALKPHOS 65 03/04/2016 0822   AST 22 03/04/2016 0822   ALT 31 03/04/2016 0822   BILITOT 0.39 03/04/2016 0822       No results found for: LABCA2  No components found for: OVZCH885  No results for [place]): INR in the last 168 hours.  Urinalysis    Component Value Date/Time   COLORURINE STRAW (A) 12/11/2008 1923   APPEARANCEUR CLEAR 12/11/2008 1923   LABSPEC 1.020 12/11/2008 1923   PHURINE 6.0 12/11/2008 1923   GLUCOSEU NEGATIVE 12/11/2008 1923   HGBUR NEGATIVE 12/11/2008 1923   BILIRUBINUR NEGATIVE 12/11/2008 1923   KETONESUR NEGATIVE 12/11/2008 1923   PROTEINUR NEGATIVE 12/11/2008 1923   UROBILINOGEN 0.2 12/11/2008 1923   NITRITE NEGATIVE 12/11/2008 1923   LEUKOCYTESUR  12/11/2008 1923    NEGATIVE MICROSCOPIC NOT DONE ON URINES WITH NEGATIVE PROTEIN, BLOOD, LEUKOCYTES, NITRITE, OR GLUCOSE <1000 mg/dL.      ELIGIBLE FOR AVAILABLE RESEARCH PROTOCOL: no  STUDIES: US Transvaginal Non-ob  Result Date: 02/27/2016 CLINICAL DATA:  Pelvic pain in a patient status post hysterectomy EXAM: TRANSABDOMINAL AND TRANSVAGINAL ULTRASOUND OF PELVIS DOPPLER ULTRASOUND OF OVARIES TECHNIQUE: Both transabdominal and transvaginal ultrasound examinations of the pelvis were performed. Transabdominal technique was performed for global imaging of the pelvis including uterus, ovaries, adnexal regions, and pelvic cul-de-sac. It was necessary to proceed with endovaginal exam following the transabdominal exam to visualize the ovaries. Color and duplex Doppler ultrasound was utilized to evaluate blood flow to the ovaries. COMPARISON:  06/12/2011. FINDINGS: Uterus Measurements: Surgically absent. Endometrium Thickness: N/A. Right ovary Measurements: 5.7 x 3.9 x 3.8 cm. 3.8 x 3.4 x 3.7 cm simple cyst identified within  the ovary. Left ovary Measurements: 1.8 x 1.0 x 1.0 cm. Normal appearance/no adnexal mass. Pulsed Doppler evaluation of both ovaries demonstrates normal low-resistance arterial and venous waveforms. Other findings No abnormal free fluid. IMPRESSION: 1. 3.8 cm cyst with benign characteristics identified in the right adnexal space. In a premenopausal female, consensus criteria indicate no imaging follow-up necessary. This recommendation follows the consensus statement: Management of Asymptomatic Ovarian and Other Adnexal Cysts Imaged at [place]: Society of Radiologists in [place]. Radiology 2010; [phone]. 2. Status post hysterectomy. Electronically Signed   By: Misty Stanley M.D.   On: 02/27/2016 16:27   US Pelvis Complete  Result Date: 02/27/2016 CLINICAL DATA:  Pelvic pain in a patient status post hysterectomy EXAM: TRANSABDOMINAL AND TRANSVAGINAL ULTRASOUND OF PELVIS DOPPLER ULTRASOUND OF OVARIES TECHNIQUE: Both transabdominal and transvaginal ultrasound examinations of the pelvis were performed. Transabdominal technique was performed for global imaging of the pelvis including uterus, ovaries, adnexal regions, and pelvic cul-de-sac. It was necessary to proceed with endovaginal exam following the transabdominal exam to visualize the ovaries. Color and duplex Doppler ultrasound was utilized to evaluate blood flow to the ovaries. COMPARISON:  06/12/2011. FINDINGS: Uterus Measurements: Surgically absent. Endometrium Thickness: N/A. Right ovary Measurements: 5.7 x 3.9 x 3.8 cm. 3.8 x 3.4 x 3.7 cm simple cyst identified within the ovary. Left ovary Measurements: 1.8 x 1.0 x 1.0 cm. Normal appearance/no adnexal mass. Pulsed Doppler evaluation of both ovaries demonstrates normal low-resistance arterial and venous waveforms. Other findings No abnormal free fluid. IMPRESSION: 1. 3.8 cm cyst with benign characteristics identified in the right adnexal space. In a premenopausal female,  consensus criteria indicate no imaging follow-up necessary. This recommendation follows the consensus statement: Management of Asymptomatic Ovarian and Other Adnexal Cysts Imaged at [place]: Society of Radiologists in [place]. Radiology 2010; [phone]. 2. Status post  hysterectomy. Electronically Signed   By: Misty Stanley M.D.   On: 02/27/2016 16:27   [place] Art/ven Flow Abd Pelv Doppler  Result Date: 02/27/2016 CLINICAL DATA:  Pelvic pain in a patient status post hysterectomy EXAM: TRANSABDOMINAL AND TRANSVAGINAL ULTRASOUND OF PELVIS DOPPLER ULTRASOUND OF OVARIES TECHNIQUE: Both transabdominal and transvaginal ultrasound examinations of the pelvis were performed. Transabdominal technique was performed for global imaging of the pelvis including uterus, ovaries, adnexal regions, and pelvic cul-de-sac. It was necessary to proceed with endovaginal exam following the transabdominal exam to visualize the ovaries. Color and duplex Doppler ultrasound was utilized to evaluate blood flow to the ovaries. COMPARISON:  06/12/2011. FINDINGS: Uterus Measurements: Surgically absent. Endometrium Thickness: N/A. Right ovary Measurements: 5.7 x 3.9 x 3.8 cm. 3.8 x 3.4 x 3.7 cm simple cyst identified within the ovary. Left ovary Measurements: 1.8 x 1.0 x 1.0 cm. Normal appearance/no adnexal mass. Pulsed Doppler evaluation of both ovaries demonstrates normal low-resistance arterial and venous waveforms. Other findings No abnormal free fluid. IMPRESSION: 1. 3.8 cm cyst with benign characteristics identified in the right adnexal space. In a premenopausal female, consensus criteria indicate no imaging follow-up necessary. This recommendation follows the consensus statement: Management of Asymptomatic Ovarian and Other Adnexal Cysts Imaged at [place]: Society of Radiologists in [place]. Radiology 2010; [phone]. 2. Status post hysterectomy. Electronically Signed   By: Misty Stanley M.D.   On: 02/27/2016 16:27    ASSESSMENT: 40 y.o. [place] woman status post left breast upper outer quadrant biopsy 11/20/2015 for a clinical T1c N0, stage IA invasive ductal carcinoma, grade 2, estrogen receptor moderately positive, progesterone receptor negative, HER-2 equivocal by both immunohistochemistry and FISH  (1) neoadjuvant tamoxifen started 12/11/2015, stopped at the start of chemotherapy  (2) genetics testing 01/03/2016 through the Breast/Ovarian gene panel offered by [facility] found no deleterious mutations in ATM, BARD1, BRCA1, BRCA2, BRIP1, CDH1, CHEK2, EPCAM, FANCC, MLH1, MSH2, MSH6, NBN, PALB2, PMS2, PTEN, RAD51C, RAD51D, TP53, and XRCC2  (3) neoadjuvant chemotherapy consisting of carboplatin, docetaxel, trastuzumab and pertuzumab every 21 days 6 starting 01/01/2016  (4) definitive surgery to follow  (5) adjuvant radiation to follow surgery   (6) anti-estrogens to be continued 5-10 years, more likely the latter    PLAN: [name] is having very classic communication problems in her marriage. Mann simply are not trained to listen in an open-ended manner. They want to close and salt. It electively 20 years to learn this and this is something we do deal with very directly in the finding your new normal group.  He does not seem to me at all that her husband is going to leave her. He 7 and in his job in [place] to be with her. He comes here every weekend. I think this is pretty good evidence that he cares for her. He just does not know how to listen to her. I am sure he's never had to do that and his dad and granddad also had no clue how to do that.  My suggestion to her as far as the communication is concern is that she incur husband were giving up his job and coming up here with her and acknowledged his sacrifice in doing that. When they are talking over things and she catches in listening without interrupting her trying to salt problems she should let him know at that  point that is what really helps her. On the other hand if he starts solving problems she can clarify his job is not salt  problems his job is just to be there and listen. The third thing that I suggested is that she take a vacation from cancer once a week and either go to a movie go out to eat or just take a walk with her husband and talk about the future. Talk about the way things are going to be when all this is over. I think that would be encouraging to both  We are dropping the Decadron to 4 mg twice daily. Hopefully this will help her not feel so bloated and have less difficulty controlling her weight.  We are proceeding with the fourth of her 6 cycles today. I think she is going to get some good news when we repeat the MRI at the end of these treatments. She will see me again with her next treatment 03/25/2016     [name], MD   03/04/2016 9:04 AM Medical Oncology and Hematology [facility] [address] [place], [place] [address] Tel. [phone]    Fax. [phone]

## 2016-03-04 NOTE — Patient Instructions (Signed)
[facility] Discharge Instructions for Patients Receiving Chemotherapy  Today you received the following chemotherapy agents Herceptin, Perjeta, Taxotere and Carboplatin, Neulasta. To help prevent nausea and vomiting after your treatment, we encourage you to take your nausea medication as directed.   If you develop nausea and vomiting that is not controlled by your nausea medication, call the clinic.   BELOW ARE SYMPTOMS THAT SHOULD BE REPORTED IMMEDIATELY:  *FEVER GREATER THAN 100.5 F  *CHILLS WITH OR WITHOUT FEVER  NAUSEA AND VOMITING THAT IS NOT CONTROLLED WITH YOUR NAUSEA MEDICATION  *UNUSUAL SHORTNESS OF BREATH  *UNUSUAL BRUISING OR BLEEDING  TENDERNESS IN MOUTH AND THROAT WITH OR WITHOUT PRESENCE OF ULCERS  *URINARY PROBLEMS  *BOWEL PROBLEMS  UNUSUAL RASH Items with * indicate a potential emergency and should be followed up as soon as possible.  Feel free to call the clinic you have any questions or concerns. The clinic phone number is [phone].  Please show the [facility] at check-in to the Emergency Department and triage nurse.

## 2016-03-04 NOTE — Progress Notes (Signed)
[facility] Counseling Intern Note   Received referral from Linda Hedges, at [facility], who asked me to follow-up with [name] during her infusion today. [name] shared that she was experiencing fear, sadness, stress and frustration over the effects of treatment. [name] shared that she had had thoughts of self-harm several nights prior but reported no plan of action and stated the thoughts occurred at a time of deep sadness and were fleeting. [name] said how helpful it had been to share her feeling during support group and expressed interest in meeting regularly with me to continue processing her emotions and experience.  She and I scheduled a counseling session for next week on 03/12/16.  Rosana Fret, Counseling Intern  voicemail - [phone] Supervisor, Lorrin Jackson, [place]

## 2016-03-06 ENCOUNTER — Ambulatory Visit: Payer: BLUE CROSS/BLUE SHIELD | Admitting: Obstetrics and Gynecology

## 2016-03-06 ENCOUNTER — Ambulatory Visit: Payer: BLUE CROSS/BLUE SHIELD

## 2016-03-06 ENCOUNTER — Telehealth: Payer: Self-pay | Admitting: Obstetrics and Gynecology

## 2016-03-06 NOTE — Telephone Encounter (Signed)
Patient is sick from recent chemotherapy and doesn't feel well enough to come in for an appointment today. Patient canceled via the answering machine. I left patient a message to call and reschedule.

## 2016-03-11 ENCOUNTER — Telehealth: Payer: Self-pay | Admitting: Nurse Practitioner

## 2016-03-11 ENCOUNTER — Other Ambulatory Visit: Payer: Self-pay | Admitting: Nurse Practitioner

## 2016-03-11 MED ORDER — FLUCONAZOLE 100 MG PO TABS: 100.0000 mg | ORAL_TABLET | Freq: Every day | ORAL | 2 refills | Status: DC

## 2016-03-11 NOTE — Telephone Encounter (Signed)
Patient calling to report numbness and burning on her tongue with white patches on cheeks that are causing decreased po intake. She has tried brushing teeth, cheeks, and tongue as well as using regular mouthwash and biotene. She is asking for a prescription for her symptoms. RN will notify Dr. Jana Hakim for further instructions.

## 2016-03-11 NOTE — Telephone Encounter (Signed)
RN called patient to inform her Diflucan prescription was called to pharmacy, to take 1 tablet daily until mouth is clear of white patches plus one extra day per Dr. Jana Hakim. Patient informed to notify us if symptoms worsen or do not improve. She verbalizes understanding.

## 2016-03-12 NOTE — Progress Notes (Signed)
Counseling Session - Note   Met with the pt for an hour-long counseling session today at 1pm. Pt shared about her struggle to accept her new identity as someone with cancer and her new physical and emotional needs. Counselor and pt discussed the tension between needing to be honest and wanting to laugh. Counselor helped pt identity her tendency to put the needs of others ahead of her own. Pt said she wanted to work on accepting her emotions as they are, without judgement; said she wanted to stop "apologizing for how I feel". Pt said she wanted to start meeting regularly with counselor. Next session scheduled for 9/12 at [facility], Counseling Intern Supervisor - Lorrin Jackson [phone]

## 2016-03-13 ENCOUNTER — Telehealth: Payer: Self-pay

## 2016-03-13 MED ORDER — CLOTRIMAZOLE 10 MG MT TROC: 10.0000 mg | Freq: Every day | OROMUCOSAL | 0 refills | Status: DC

## 2016-03-13 NOTE — Telephone Encounter (Signed)
lvm that we rx mycelex troche for her thrush and how to use

## 2016-03-17 ENCOUNTER — Ambulatory Visit: Payer: BLUE CROSS/BLUE SHIELD | Admitting: Podiatry

## 2016-03-17 ENCOUNTER — Telehealth: Payer: Self-pay

## 2016-03-17 DIAGNOSIS — C50412 Malignant neoplasm of upper-outer quadrant of left female breast: Secondary | ICD-10-CM

## 2016-03-17 MED ORDER — LORAZEPAM 0.5 MG PO TABS: 0.5000 mg | ORAL_TABLET | Freq: Every evening | ORAL | 0 refills | Status: DC | PRN

## 2016-03-17 MED ORDER — VALACYCLOVIR HCL 500 MG PO TABS: 500.0000 mg | ORAL_TABLET | Freq: Every day | ORAL | 1 refills | Status: DC

## 2016-03-17 NOTE — Telephone Encounter (Signed)
Pt called about staying on valtrex, and doxycycline. She also requested lorazepam refill. S/w Dr Jana Hakim. She stays on valtrex for prophylaxis, doxycycline is for face rash. Called pt back and LVM with instructions

## 2016-03-20 ENCOUNTER — Ambulatory Visit (INDEPENDENT_AMBULATORY_CARE_PROVIDER_SITE_OTHER): Payer: BLUE CROSS/BLUE SHIELD | Admitting: Obstetrics and Gynecology

## 2016-03-20 ENCOUNTER — Encounter: Payer: Self-pay | Admitting: Obstetrics and Gynecology

## 2016-03-20 ENCOUNTER — Encounter: Payer: Self-pay | Admitting: Podiatry

## 2016-03-20 ENCOUNTER — Ambulatory Visit (INDEPENDENT_AMBULATORY_CARE_PROVIDER_SITE_OTHER): Payer: BLUE CROSS/BLUE SHIELD | Admitting: Podiatry

## 2016-03-20 VITALS — Ht 64.0 in | Wt 194.0 lb

## 2016-03-20 VITALS — BP 134/80 | HR 84 | Resp 13 | Wt 194.0 lb

## 2016-03-20 DIAGNOSIS — N941 Unspecified dyspareunia: Secondary | ICD-10-CM

## 2016-03-20 DIAGNOSIS — B353 Tinea pedis: Secondary | ICD-10-CM

## 2016-03-20 DIAGNOSIS — B351 Tinea unguium: Secondary | ICD-10-CM

## 2016-03-20 DIAGNOSIS — R102 Pelvic and perineal pain: Secondary | ICD-10-CM | POA: Diagnosis not present

## 2016-03-20 DIAGNOSIS — N898 Other specified noninflammatory disorders of vagina: Secondary | ICD-10-CM | POA: Diagnosis not present

## 2016-03-20 DIAGNOSIS — N83201 Unspecified ovarian cyst, right side: Secondary | ICD-10-CM

## 2016-03-20 NOTE — Progress Notes (Signed)
GYNECOLOGY  VISIT   HPI: 40 y.o.   Married  [place] American  female   G1P1001 with No LMP recorded. Patient has had a hysterectomy.   here for follow up on pelvic pain.  The patient was seen 3 weeks ago with pelvic pain and was diagnosed with a 3.8 cm simple right ovarian cyst (no signs of torsion). Since that visit her pain has resolved.    She is undergoing chemo for breast cancer (invasive ductal carcinoma,ER+, PR-, HER-2 equivocal). Last treatment on 05/06/16. She is planning a lumpectomy after her chemo. She would like to schedule removal of her ovaries after her chemo, wondering if that can be done at the same time as her lumpectomy.  She has a h/o a LAVH in 2007 (at [facility]). Prior to that surgery she had 2 laparoscopies and had been treated for endometriosis. After her hysterectomy she was having cyclic pelvic pain and was medically treated for endometriosis. I've reviewed the op note from 7/07 of her LAVH, no mention of adhesions or endometriosis was made.  She is already having hot flashes and night sweats from the chemotherapy. So far tolerable.   She has no libido, causing some stress between her and her husband. She has some vaginal dryness baseline and having discomfort with intercourse.  She is on Zoloft for depression and started seeing a counselor last week.   GYNECOLOGIC HISTORY: No LMP recorded. Patient has had a hysterectomy. Contraception:hysterectomy  Menopausal hormone therapy: none         OB History    Gravida Para Term Preterm AB Living   '1 1 1     1   ' SAB TAB Ectopic Multiple Live Births                     Patient Active Problem List   Diagnosis Date Noted  . Genetic testing 01/07/2016  . Family history of breast cancer   . Breast cancer of upper-outer quadrant of left female breast ([place]) 12/11/2015    Past Medical History:  Diagnosis Date  . Allergy   . Anxiety   . Asthma    childhood   . Breast cancer ([place]) 11/20/15   left breast  .  Depression   . Endometriosis   . Family history of breast cancer   . GERD (gastroesophageal reflux disease)   . Herniated disc, cervical   . History of bronchitis as a child   . History of stomach ulcers   . History of urinary tract infection   . Hormone disorder   . IBD (inflammatory bowel disease)   . Migraine without aura   . Neck pain   . PONV (postoperative nausea and vomiting)   . Shortness of breath dyspnea    anxiety     Past Surgical History:  Procedure Laterality Date  . ABDOMINAL HYSTERECTOMY     Still has ovaries  . PORTACATH PLACEMENT Right 12/25/2015   Procedure: INSERTION PORT-A-CATH WITH ULTRA SOUND;  Surgeon: [name], MD;  Location: [facility];  Service: General;  Laterality: Right;  Laparoscopy x 2, diagnosed with endometriosis, not treated. Then in 2007, LAVH   Current Outpatient Prescriptions  Medication Sig Dispense Refill  . clotrimazole (MYCELEX) 10 MG troche Take 1 tablet (10 mg total) by mouth 5 (five) times daily. Dissolve in mouth and swish 25 tablet 0  . dexamethasone (DECADRON) 4 MG tablet Take 1 tablet (4 mg total) by mouth 2 (two) times daily. Start the day  before Taxotere. Then again the day after chemo for 3 days. 30 tablet 1  . doxycycline (VIBRA-TABS) 100 MG tablet Take 1 tablet (100 mg total) by mouth daily. 60 tablet 1  . lidocaine-prilocaine (EMLA) cream Apply to affected area once 30 g 3  . LORazepam (ATIVAN) 0.5 MG tablet Take 1 tablet (0.5 mg total) by mouth at bedtime as needed (Nausea or vomiting). 30 tablet 0  . prochlorperazine (COMPAZINE) 10 MG tablet Take 1 tablet (10 mg total) by mouth every 6 (six) hours as needed (Nausea or vomiting). 30 tablet 1  . sertraline (ZOLOFT) 100 MG tablet Take 1 tablet (100 mg total) by mouth daily. 30 tablet 3  . traMADol (ULTRAM) 50 MG tablet Take 1 tablet (50 mg total) by mouth every 6 (six) hours as needed for moderate pain. 60 tablet 1  . valACYclovir (VALTREX) 500 MG tablet Take 1 tablet (500  mg total) by mouth daily. 60 tablet 1  . zolpidem (AMBIEN) 10 MG tablet Take 1/2 to 1 tab po qhs prn 30 tablet 0   No current facility-administered medications for this visit.      ALLERGIES: Amoxicillin and Diflucan [fluconazole]  Family History  Problem Relation Age of Onset  . Sarcoidosis Mother   . Hypertension Father   . Throat cancer Maternal Grandfather     smoker and heavy drinker; dx in his late 59s-50s  . Cancer Paternal Grandmother     possible gatric vs bladder cancer  . Bladder Cancer Paternal Grandmother   . Breast cancer Paternal Aunt     dxin her 18s; dad's maternal half sister  . Breast cancer Other     PGFs mother  . Anesthesia problems Neg Hx   . Hypotension Neg Hx   . Malignant hyperthermia Neg Hx   . Pseudochol deficiency Neg Hx     Social History   Social History  . Marital status: Married    Spouse name: N/A  . Number of children: 1  . Years of education: N/A   Occupational History  . Not on file.   Social History Main Topics  . Smoking status: Never Smoker  . Smokeless tobacco: Never Used  . Alcohol use 0.0 oz/week     Comment: socially   . Drug use: No  . Sexual activity: Yes    Partners: Male   Other Topics Concern  . Not on file   Social History Narrative  . No narrative on file    Review of Systems  Constitutional: Negative.   HENT: Negative.   Eyes: Negative.   Respiratory: Negative.   Cardiovascular: Negative.   Gastrointestinal: Negative.   Genitourinary: Negative.   Musculoskeletal: Negative.   Skin: Negative.   Neurological: Negative.   Endo/Heme/Allergies: Negative.   Psychiatric/Behavioral: Negative.     PHYSICAL EXAMINATION:    BP 134/80 (BP Location: Right Arm, Patient Position: Sitting, Cuff Size: Normal)   Pulse 84   Resp 13   Wt 194 lb (88 kg)   BMI 33.30 kg/m     General appearance: alert, cooperative and appears stated age Abdomen: soft, non-tender; non-distended, no masses,  no  organomegaly   ASSESSMENT Ovarian cyst, pain has resolved, simple on ultrasound Breast cancer, invasive ductal, ER+, PR-, HER2 equivocal. Negative genetic testing Vaginal dryness Mild entry dyspareunia  PLAN No f/u for the ovarian cyst needed She has discussed anti-estrogens with her oncologist Desires removal of her tubes/ovaries, has discussed with Oncologist (recommended to wait until after her current treatment and  surgery) Given samples of replens vaginal lubricant Recommended she use a lubricant with intercourse, let me know if it isn't helping   An After Visit Summary was printed and given to the patient.  20 minutes face to face time of which over 50% was spent in counseling.   CC: Jordan Del, MD

## 2016-03-20 NOTE — Patient Instructions (Signed)
Seen for dystrophic nails. Noted of fungal infected deformed toe nails on both feet, mild tinea pedis on heels. Both big toe nails debrided. May benefit to use Salsun blue shampoo scrub for peeling skin area.  Return in 3 months or as needed.

## 2016-03-20 NOTE — Progress Notes (Signed)
SUBJECTIVE: 40 y.o. year old female presents stating that her finger nails and toe nails change color and cracking, sore on big toe. Been on Chemotherapy for 2 weeks from Breast Cancer. Diagnosed with breast cancer since May 2017.   REVIEW OF SYSTEMS: Pertinent items noted in HPI and remainder of comprehensive ROS otherwise negative.  OBJECTIVE: DERMATOLOGIC EXAMINATION: Nails: dark hypertrophic and disfigured great toe nails with fungal debris under nail plate bilateral. Mild peeling dry skin on both heels.  VASCULAR EXAMINATION OF LOWER LIMBS: Pedal pulses: All pedal pulses are palpable with normal pulsation.  Temperature gradient from tibial crest to dorsum of foot is within normal bilateral.  NEUROLOGIC EXAMINATION OF THE LOWER LIMBS: All epicritic and tactile sensations grossly intact.  MUSCULOSKELETAL EXAMINATION: Rectus foot without gross deformities.  ASSESSMENT: Onychomycosis both great toe. Tinea pedis plantar heel bilateral.   PLAN: Reviewed clinical findings and available treatment options. Both big toe nails debrided. Instructed to scrub both feet after each shower with Salsun blue shampoo.

## 2016-03-24 ENCOUNTER — Other Ambulatory Visit: Payer: Self-pay | Admitting: Oncology

## 2016-03-24 ENCOUNTER — Other Ambulatory Visit: Payer: Self-pay | Admitting: *Deleted

## 2016-03-24 DIAGNOSIS — C50412 Malignant neoplasm of upper-outer quadrant of left female breast: Secondary | ICD-10-CM

## 2016-03-24 NOTE — Progress Notes (Signed)
This is [facility]. [facility]  [phone] [phone]     ID: [name] DOB: 05/14/1976  MR#: [id number]  QIO#:[id number]  Patient Care Team: No Pcp Per Patient as PCP - General (General Practice) [name], MD as Consulting Physician (Oncology) [name], MD as Consulting Physician (General Surgery) [name], MD as Consulting Physician (Obstetrics and Gynecology) PCP: No PCP Per Patient OTHER MD:  CHIEF COMPLAINT: Estrogen receptor positive breast cancer  CURRENT TREATMENT: neoadjuvant chemotherapy and anti-HER-2 immunotherapy   BREAST CANCER HISTORY: From the original intake note:  [name] woke up the morning of 11/09/2015 with some pain in her left axilla. She examined herself and found a lump in her left breast. She saw her gynecologist the same day, and she confirms a lump. The patient then proceeded directly to mammography. I do not have that report. However it did show the mass. [name] was then scheduled for ultrasound-guided biopsy 11/21/2015. This showed ([place] [phone] at the [facility] [employer]) and invasive ductal carcinoma, grade 2 estrogen receptor positive, with moderate intensity (10-50%), progesterone receptor negative, and HER-2 equivocal by immunohistochemistry. Fish was obtained and was equivocal as well, with a signals ratio of 1.8 to, the number per cell being 5.7.  On 12/05/2015 the patient had a CT/ angiogram of the chest for evaluation of her left-sided chest pain. This showed no evidence of a clot. The left breast nodule measured 1.3 cm on the study. There was no enlarged axillary adenopathy. There was also no evidence of lung metastasis or blastic or destructive lytic bone lesions. The upper abdomen was unremarkable.  With that information the patient presents for further evaluation and treatment.  INTERVAL HISTORY: [name] returns today for follow-up of her HER-2  positive breast cancer. Today is day 1 cycle 4 of 6 planned doses of carboplatin, docetaxel, trastuzumab and pertuzumab given every 21 days, to be followed by trastuzumab alone for one year.  [name] to the support group yesterday and "had a breakdown", chiefly because of family issues. I got a note from the facilitator alerting me regarding this. She felt [name] was depressed anxious worried about her marriage but not suicidal. Of [name] just the diagnosis of cancer and having to receive chemotherapy in itself can be depressing  REVIEW OF SYSTEMS: [name] has some diarrhea after each cycle and she controls this with Imodium. She has had some bloating and she tells me she just had an ultrasound under her gynecologist which showed an ovarian cyst. That was very irritating but it is improving. She feels very tired. She has a little bit of a runny nose. She is deconditioned and short of breath particularly when climbing stairs. Occasionally she has a dry cough, likely related to her heartburn symptoms. She has back and joint pain. Occasionally she has headaches. She complains of a brain falx. Hot flashes are making it difficult for her to sleep. Aside from these issues of [name] there are the communication issues with her husband. These are discussed further below. A detailed review of systems today was otherwise stable.  PAST MEDICAL HISTORY: Past Medical History:  Diagnosis Date  . Allergy   . Anxiety   . Asthma    childhood   . Breast cancer ([place]) 11/20/15   left breast  . Depression   . Endometriosis   . Family history of breast cancer   . GERD (gastroesophageal reflux disease)   . Herniated disc, cervical   .  History of bronchitis as a child   . History of stomach ulcers   . History of urinary tract infection   . Hormone disorder   . IBD (inflammatory bowel disease)   . Migraine without aura   . Neck pain   . PONV (postoperative nausea and vomiting)   . Shortness of breath dyspnea     anxiety     PAST SURGICAL HISTORY: Past Surgical History:  Procedure Laterality Date  . laparoscopically assisted vaginal hysterectomy  01/2006   Still has ovaries  . LAPAROSCOPY     x 2, diagnosed with endometriosis (prior to hysterectomy), only treated medically.  Marland Kitchen PORTACATH PLACEMENT Right 12/25/2015   Procedure: INSERTION PORT-A-CATH WITH ULTRA SOUND;  Surgeon: [name], MD;  Location: [facility];  Service: General;  Laterality: Right;    FAMILY HISTORY Family History  Problem Relation Age of Onset  . Sarcoidosis Mother   . Hypertension Father   . Throat cancer Maternal Grandfather     smoker and heavy drinker; dx in his late 27s-50s  . Cancer Paternal Grandmother     possible gatric vs bladder cancer  . Bladder Cancer Paternal Grandmother   . Breast cancer Paternal Aunt     dxin her 41s; dad's maternal half sister  . Breast cancer Other     PGFs mother  . Anesthesia problems Neg Hx   . Hypotension Neg Hx   . Malignant hyperthermia Neg Hx   . Pseudochol deficiency Neg Hx   The patient's parents are both living, in their early 8s. The patient has one brother and one sister. On the father's side 1 great grandmother was diagnosed with breast cancer in her 55s. The patient's father's mother was diagnosed with stomach cancer. One of the patient's father's sisters was diagnosed with breast cancer in her 19s. On the maternal side patient's mother's father was diagnosed with throat cancer at age.   GYNECOLOGIC HISTORY:  No LMP recorded. Patient has had a hysterectomy.  menarche age 48, first live birth age 65. The patient is GX P1. She stopped having periods in 2008, when she underwent a simple hysterectomy without salpingo-oophorectomy. She has a history of endometriosis and was started on Provera in March of this year, with her next dose due next week. (However she has decided to forego further Provera treatments at least for now).   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 He is licensed in [place] but not in [place]. He is now in this area looking for a job. The patient's son [name] is a Ship broker in music at [employer]: Not in place   HEALTH MAINTENANCE: Social History  Substance Use Topics  . Smoking status: Never Smoker  . Smokeless tobacco: Never Used  . Alcohol use 0.0 oz/week     Comment: socially      Colonoscopy:  PAP:  Bone density:  Lipid panel:  Allergies  Allergen Reactions  . Amoxicillin Shortness Of Breath and Itching    Has patient had a PCN reaction causing immediate rash, facial/tongue/throat swelling, SOB or lightheadedness with hypotension: Yes Has patient had a PCN reaction causing severe rash involving mucus membranes or skin necrosis: No Has patient had a PCN reaction that required hospitalization Yes Has patient had a PCN reaction occurring within the last 10 years:  No If all of the above answers are "NO", then may proceed with Cephalosporin use.   . Diflucan [Fluconazole] Nausea Only    heartburn    Current Outpatient Prescriptions  Medication Sig Dispense Refill  . clotrimazole (MYCELEX) 10 MG troche Take 1 tablet (10 mg total) by mouth 5 (five) times daily. Dissolve in mouth and swish 25 tablet 0  . dexamethasone (DECADRON) 4 MG tablet Take 1 tablet (4 mg total) by mouth 2 (two) times daily. Start the day before Taxotere. Then again the day after chemo for 3 days. 30 tablet 1  . doxycycline (VIBRA-TABS) 100 MG tablet Take 1 tablet (100 mg total) by mouth daily. 60 tablet 1  . lidocaine-prilocaine (EMLA) cream Apply to affected area once 30 g 3  . LORazepam (ATIVAN) 0.5 MG tablet Take 1 tablet (0.5 mg total) by mouth at bedtime as needed (Nausea or vomiting). 30 tablet 0  . prochlorperazine (COMPAZINE) 10 MG tablet Take 1 tablet  (10 mg total) by mouth every 6 (six) hours as needed (Nausea or vomiting). 30 tablet 1  . sertraline (ZOLOFT) 100 MG tablet Take 1 tablet (100 mg total) by mouth daily. 30 tablet 3  . traMADol (ULTRAM) 50 MG tablet Take 1 tablet (50 mg total) by mouth every 6 (six) hours as needed for moderate pain. 60 tablet 1  . valACYclovir (VALTREX) 500 MG tablet Take 1 tablet (500 mg total) by mouth daily. 60 tablet 1  . zolpidem (AMBIEN) 10 MG tablet Take 1/2 to 1 tab po qhs prn 30 tablet 0   No current facility-administered medications for this visit.     OBJECTIVE: Early middle-aged African-American woman Who appears younger than stated age  There were no vitals filed for this visit.   There is no height or weight on file to calculate BMI.    ECOG FS:1 - Symptomatic but completely ambulatory There were no vitals filed for this visit.  Sclerae unicteric, EOMs intact Oropharynx clear and moist No cervical or supraclavicular adenopathy Lungs no rales or rhonchi Heart regular rate and rhythm Abd soft, nontender, positive bowel sounds, no masses palpated MSK no focal spinal tenderness, no upper extremity lymphedema Neuro: nonfocal, well oriented, appropriate affect Breasts: Deferred   LAB RESULTS:  CMP     Component Value Date/Time   NA 143 03/04/2016 0822   K 3.9 03/04/2016 0822   CL 107 12/04/2015 1858   CO2 23 03/04/2016 0822   GLUCOSE 189 (H) 03/04/2016 0822   BUN 11.3 03/04/2016 0822   CREATININE 0.9 03/04/2016 0822   CALCIUM 10.0 03/04/2016 0822   PROT 7.1 03/04/2016 0822   ALBUMIN 3.6 03/04/2016 0822   AST 22 03/04/2016 0822   ALT 31 03/04/2016 0822   ALKPHOS 65 03/04/2016 0822   BILITOT 0.39 03/04/2016 0822   GFRNONAA >60 12/04/2015 1858   GFRAA >60 12/04/2015 1858    INo results found for: SPEP, UPEP  Lab Results  Component Value Date   WBC 12.6 (H) 03/04/2016   NEUTROABS 10.7 (H) 03/04/2016   HGB 10.1 (L) 03/04/2016   HCT 29.6 (L) 03/04/2016   MCV 88.9 03/04/2016    PLT 225 03/04/2016      Chemistry      Component Value Date/Time   NA 143 03/04/2016 0822   K 3.9 03/04/2016 0822   CL 107 12/04/2015 1858   CO2 23 03/04/2016 0822   BUN 11.3 03/04/2016 0822   CREATININE 0.9 03/04/2016 0822      Component Value  Date/Time   CALCIUM 10.0 03/04/2016 0822   ALKPHOS 65 03/04/2016 0822   AST 22 03/04/2016 0822   ALT 31 03/04/2016 0822   BILITOT 0.39 03/04/2016 0822       No results found for: LABCA2  No components found for: LABCA125  No results for input(s): INR in the last 168 hours.  Urinalysis    Component Value Date/Time   COLORURINE STRAW (A) 12/11/2008 1923   APPEARANCEUR CLEAR 12/11/2008 1923   LABSPEC 1.020 12/11/2008 1923   PHURINE 6.0 12/11/2008 1923   GLUCOSEU NEGATIVE 12/11/2008 1923   HGBUR NEGATIVE 12/11/2008 1923   BILIRUBINUR NEGATIVE 12/11/2008 1923   KETONESUR NEGATIVE 12/11/2008 1923   PROTEINUR NEGATIVE 12/11/2008 1923   UROBILINOGEN 0.2 12/11/2008 1923   NITRITE NEGATIVE 12/11/2008 1923   LEUKOCYTESUR  12/11/2008 1923    NEGATIVE MICROSCOPIC NOT DONE ON URINES WITH NEGATIVE PROTEIN, BLOOD, LEUKOCYTES, NITRITE, OR GLUCOSE <1000 mg/dL.      ELIGIBLE FOR AVAILABLE RESEARCH PROTOCOL: no  STUDIES: US Transvaginal Non-ob  Result Date: 02/27/2016 CLINICAL DATA:  Pelvic pain in a patient status post hysterectomy EXAM: TRANSABDOMINAL AND TRANSVAGINAL ULTRASOUND OF PELVIS DOPPLER ULTRASOUND OF OVARIES TECHNIQUE: Both transabdominal and transvaginal ultrasound examinations of the pelvis were performed. Transabdominal technique was performed for global imaging of the pelvis including uterus, ovaries, adnexal regions, and pelvic cul-de-sac. It was necessary to proceed with endovaginal exam following the transabdominal exam to visualize the ovaries. Color and duplex Doppler ultrasound was utilized to evaluate blood flow to the ovaries. COMPARISON:  06/12/2011. FINDINGS: Uterus Measurements: Surgically absent. Endometrium  Thickness: N/A. Right ovary Measurements: 5.7 x 3.9 x 3.8 cm. 3.8 x 3.4 x 3.7 cm simple cyst identified within the ovary. Left ovary Measurements: 1.8 x 1.0 x 1.0 cm. Normal appearance/no adnexal mass. Pulsed Doppler evaluation of both ovaries demonstrates normal low-resistance arterial and venous waveforms. Other findings No abnormal free fluid. IMPRESSION: 1. 3.8 cm cyst with benign characteristics identified in the right adnexal space. In a premenopausal female, consensus criteria indicate no imaging follow-up necessary. This recommendation follows the consensus statement: Management of Asymptomatic Ovarian and Other Adnexal Cysts Imaged at [place]: Society of Radiologists in [place]. Radiology 2010; [phone]. 2. Status post hysterectomy. Electronically Signed   By: Misty Stanley M.D.   On: 02/27/2016 16:27   US Pelvis Complete  Result Date: 02/27/2016 CLINICAL DATA:  Pelvic pain in a patient status post hysterectomy EXAM: TRANSABDOMINAL AND TRANSVAGINAL ULTRASOUND OF PELVIS DOPPLER ULTRASOUND OF OVARIES TECHNIQUE: Both transabdominal and transvaginal ultrasound examinations of the pelvis were performed. Transabdominal technique was performed for global imaging of the pelvis including uterus, ovaries, adnexal regions, and pelvic cul-de-sac. It was necessary to proceed with endovaginal exam following the transabdominal exam to visualize the ovaries. Color and duplex Doppler ultrasound was utilized to evaluate blood flow to the ovaries. COMPARISON:  06/12/2011. FINDINGS: Uterus Measurements: Surgically absent. Endometrium Thickness: N/A. Right ovary Measurements: 5.7 x 3.9 x 3.8 cm. 3.8 x 3.4 x 3.7 cm simple cyst identified within the ovary. Left ovary Measurements: 1.8 x 1.0 x 1.0 cm. Normal appearance/no adnexal mass. Pulsed Doppler evaluation of both ovaries demonstrates normal low-resistance arterial and venous waveforms. Other findings No abnormal free fluid. IMPRESSION: 1.  3.8 cm cyst with benign characteristics identified in the right adnexal space. In a premenopausal female, consensus criteria indicate no imaging follow-up necessary. This recommendation follows the consensus statement: Management of Asymptomatic Ovarian and Other Adnexal Cysts Imaged at [place]: Society of Radiologists in Ultrasound  Consensus Technical sales engineer. Radiology 2010; [phone]. 2. Status post hysterectomy. Electronically Signed   By: Misty Stanley M.D.   On: 02/27/2016 16:27   [place] Art/ven Flow Abd Pelv Doppler  Result Date: 02/27/2016 CLINICAL DATA:  Pelvic pain in a patient status post hysterectomy EXAM: TRANSABDOMINAL AND TRANSVAGINAL ULTRASOUND OF PELVIS DOPPLER ULTRASOUND OF OVARIES TECHNIQUE: Both transabdominal and transvaginal ultrasound examinations of the pelvis were performed. Transabdominal technique was performed for global imaging of the pelvis including uterus, ovaries, adnexal regions, and pelvic cul-de-sac. It was necessary to proceed with endovaginal exam following the transabdominal exam to visualize the ovaries. Color and duplex Doppler ultrasound was utilized to evaluate blood flow to the ovaries. COMPARISON:  06/12/2011. FINDINGS: Uterus Measurements: Surgically absent. Endometrium Thickness: N/A. Right ovary Measurements: 5.7 x 3.9 x 3.8 cm. 3.8 x 3.4 x 3.7 cm simple cyst identified within the ovary. Left ovary Measurements: 1.8 x 1.0 x 1.0 cm. Normal appearance/no adnexal mass. Pulsed Doppler evaluation of both ovaries demonstrates normal low-resistance arterial and venous waveforms. Other findings No abnormal free fluid. IMPRESSION: 1. 3.8 cm cyst with benign characteristics identified in the right adnexal space. In a premenopausal female, consensus criteria indicate no imaging follow-up necessary. This recommendation follows the consensus statement: Management of Asymptomatic Ovarian and Other Adnexal Cysts Imaged at [place]: Society of Radiologists in [place]. Radiology 2010; [phone]. 2. Status post hysterectomy. Electronically Signed   By: Misty Stanley M.D.   On: 02/27/2016 16:27    ASSESSMENT: 40 y.o. [place] woman status post left breast upper outer quadrant biopsy 11/20/2015 for a clinical T1c N0, stage IA invasive ductal carcinoma, grade 2, estrogen receptor moderately positive, progesterone receptor negative, HER-2 equivocal by both immunohistochemistry and FISH  (1) neoadjuvant tamoxifen started 12/11/2015, stopped at the start of chemotherapy  (2) genetics testing 01/03/2016 through the Breast/Ovarian gene panel offered by [facility] found no deleterious mutations in ATM, BARD1, BRCA1, BRCA2, BRIP1, CDH1, CHEK2, EPCAM, FANCC, MLH1, MSH2, MSH6, NBN, PALB2, PMS2, PTEN, RAD51C, RAD51D, TP53, and XRCC2  (3) neoadjuvant chemotherapy consisting of carboplatin, docetaxel, trastuzumab and pertuzumab every 21 days 6 starting 01/01/2016  (4) definitive surgery to follow  (5) adjuvant radiation to follow surgery   (6) anti-estrogens to be continued 5-10 years, more likely the latter    PLAN: [name] is having very classic communication problems in her marriage. Mann simply are not trained to listen in an open-ended manner. They want to close and salt. It electively 20 years to learn this and this is something we do deal with very directly in the finding your new normal group.  He does not seem to me at all that her husband is going to leave her. He 7 and in his job in [place] to be with her. He comes here every weekend. I think this is pretty good evidence that he cares for her. He just does not know how to listen to her. I am sure he's never had to do that and his dad and granddad also had no clue how to do that.  My suggestion to her as far as the communication is concern is that she incur husband were giving up his job and coming up here with her and acknowledged his sacrifice in doing that. When they are talking over things and she  catches in listening without interrupting her trying to salt problems she should let him know at that point that is what really helps her. On the other hand if he starts solving  problems she can clarify his job is not salt problems his job is just to be there and listen. The third thing that I suggested is that she take a vacation from cancer once a week and either go to a movie go out to eat or just take a walk with her husband and talk about the future. Talk about the way things are going to be when all this is over. I think that would be encouraging to both  We are dropping the Decadron to 4 mg twice daily. Hopefully this will help her not feel so bloated and have less difficulty controlling her weight.  We are proceeding with the fourth of her 6 cycles today. I think she is going to get some good news when we repeat the MRI at the end of these treatments. She will see me again with her next treatment 03/25/2016     [name], MD   03/24/2016 4:00 PM Medical Oncology and Hematology [facility] [address] [place], [place] [address] Tel. [phone]    Fax. [phone] it was a core needle showed no she start with [place]

## 2016-03-25 ENCOUNTER — Encounter: Payer: Self-pay | Admitting: *Deleted

## 2016-03-25 ENCOUNTER — Ambulatory Visit (HOSPITAL_BASED_OUTPATIENT_CLINIC_OR_DEPARTMENT_OTHER): Payer: BLUE CROSS/BLUE SHIELD

## 2016-03-25 ENCOUNTER — Ambulatory Visit (HOSPITAL_BASED_OUTPATIENT_CLINIC_OR_DEPARTMENT_OTHER): Payer: BLUE CROSS/BLUE SHIELD | Admitting: Oncology

## 2016-03-25 VITALS — BP 146/90 | HR 93 | Temp 97.2°F | Resp 17 | Ht 64.0 in | Wt 195.5 lb

## 2016-03-25 DIAGNOSIS — C50412 Malignant neoplasm of upper-outer quadrant of left female breast: Secondary | ICD-10-CM

## 2016-03-25 DIAGNOSIS — Z5111 Encounter for antineoplastic chemotherapy: Secondary | ICD-10-CM | POA: Diagnosis not present

## 2016-03-25 DIAGNOSIS — Z5189 Encounter for other specified aftercare: Secondary | ICD-10-CM

## 2016-03-25 DIAGNOSIS — Z170 Estrogen receptor positive status [ER+]: Secondary | ICD-10-CM

## 2016-03-25 DIAGNOSIS — Z5112 Encounter for antineoplastic immunotherapy: Secondary | ICD-10-CM | POA: Diagnosis not present

## 2016-03-25 LAB — CBC WITH DIFFERENTIAL/PLATELET
BASO%: 0.1 % (ref 0.0–2.0)
Basophils Absolute: 0 10*3/uL (ref 0.0–0.1)
EOS%: 0 % (ref 0.0–7.0)
Eosinophils Absolute: 0 10*3/uL (ref 0.0–0.5)
HCT: 29.5 % — ABNORMAL LOW (ref 34.8–46.6)
HGB: 9.8 g/dL — ABNORMAL LOW (ref 11.6–15.9)
LYMPH%: 16.1 % (ref 14.0–49.7)
MCH: 31.2 pg (ref 25.1–34.0)
MCHC: 33.3 g/dL (ref 31.5–36.0)
MCV: 93.7 fL (ref 79.5–101.0)
MONO#: 1.1 10*3/uL — ABNORMAL HIGH (ref 0.1–0.9)
MONO%: 9.4 % (ref 0.0–14.0)
NEUT#: 8.3 10*3/uL — ABNORMAL HIGH (ref 1.5–6.5)
NEUT%: 74.4 % (ref 38.4–76.8)
Platelets: 257 10*3/uL (ref 145–400)
RBC: 3.15 10*6/uL — ABNORMAL LOW (ref 3.70–5.45)
RDW: 18.6 % — ABNORMAL HIGH (ref 11.2–14.5)
WBC: 11.2 10*3/uL — ABNORMAL HIGH (ref 3.9–10.3)
lymph#: 1.8 10*3/uL (ref 0.9–3.3)

## 2016-03-25 LAB — COMPREHENSIVE METABOLIC PANEL
ALT: 19 U/L (ref 0–55)
AST: 17 U/L (ref 5–34)
Albumin: 3.6 g/dL (ref 3.5–5.0)
Alkaline Phosphatase: 64 U/L (ref 40–150)
Anion Gap: 10 mEq/L (ref 3–11)
BUN: 14.9 mg/dL (ref 7.0–26.0)
CO2: 23 mEq/L (ref 22–29)
Calcium: 9.5 mg/dL (ref 8.4–10.4)
Chloride: 108 mEq/L (ref 98–109)
Creatinine: 0.9 mg/dL (ref 0.6–1.1)
EGFR: >90 mL/min/{1.73_m2} (ref >90)
Glucose: 132 mg/dl (ref 70–140)
Potassium: 3.8 mEq/L (ref 3.5–5.1)
Sodium: 141 mEq/L (ref 136–145)
Total Bilirubin: 0.32 mg/dL (ref 0.20–1.20)
Total Protein: 7 g/dL (ref 6.4–8.3)

## 2016-03-25 MED ORDER — DIPHENHYDRAMINE HCL 25 MG PO CAPS
25.0000 mg | ORAL_CAPSULE | Freq: Once | ORAL | Status: AC
Start: 2016-03-25 — End: 2016-03-25
  Administered 2016-03-25: 25 mg via ORAL

## 2016-03-25 MED ORDER — DOXYCYCLINE HYCLATE 100 MG PO TABS: 100.0000 mg | ORAL_TABLET | Freq: Every day | ORAL | 1 refills | Status: DC

## 2016-03-25 MED ORDER — SODIUM CHLORIDE 0.9 % IV SOLN
Freq: Once | INTRAVENOUS | Status: AC
  Administered 2016-03-25: 12:00:00 via INTRAVENOUS

## 2016-03-25 MED ORDER — SODIUM CHLORIDE 0.9 % IV SOLN
10.0000 mg | Freq: Once | INTRAVENOUS | Status: AC
  Administered 2016-03-25: 10 mg via INTRAVENOUS
  Filled 2016-03-25: qty 1

## 2016-03-25 MED ORDER — SODIUM CHLORIDE 0.9 % IV SOLN
656.0000 mg | Freq: Once | INTRAVENOUS | Status: AC
  Administered 2016-03-25: 660 mg via INTRAVENOUS
  Filled 2016-03-25: qty 66

## 2016-03-25 MED ORDER — SODIUM CHLORIDE 0.9 % IV SOLN
420.0000 mg | Freq: Once | INTRAVENOUS | Status: AC
  Administered 2016-03-25: 420 mg via INTRAVENOUS
  Filled 2016-03-25: qty 14

## 2016-03-25 MED ORDER — TRAMADOL HCL 50 MG PO TABS
50.0000 mg | ORAL_TABLET | Freq: Once | ORAL | Status: AC
  Administered 2016-03-25: 50 mg via ORAL

## 2016-03-25 MED ORDER — TRASTUZUMAB CHEMO 150 MG IV SOLR
6.0000 mg/kg | Freq: Once | INTRAVENOUS | Status: AC
  Administered 2016-03-25: 504 mg via INTRAVENOUS
  Filled 2016-03-25: qty 24

## 2016-03-25 MED ORDER — PALONOSETRON HCL INJECTION 0.25 MG/5ML
INTRAVENOUS | Status: AC
  Filled 2016-03-25: qty 5

## 2016-03-25 MED ORDER — ACETAMINOPHEN 325 MG PO TABS
650.0000 mg | ORAL_TABLET | Freq: Once | ORAL | Status: AC
  Administered 2016-03-25: 650 mg via ORAL

## 2016-03-25 MED ORDER — SODIUM CHLORIDE 0.9% FLUSH
10.0000 mL | INTRAVENOUS | Status: DC | PRN
  Administered 2016-03-25: 10 mL
  Filled 2016-03-25: qty 10

## 2016-03-25 MED ORDER — SODIUM CHLORIDE 0.9 % IV SOLN
75.0000 mg/m2 | Freq: Once | INTRAVENOUS | Status: AC
  Administered 2016-03-25: 140 mg via INTRAVENOUS
  Filled 2016-03-25: qty 14

## 2016-03-25 MED ORDER — ACETAMINOPHEN 325 MG PO TABS
ORAL_TABLET | ORAL | Status: AC
  Filled 2016-03-25: qty 2

## 2016-03-25 MED ORDER — HEPARIN SOD (PORK) LOCK FLUSH 100 UNIT/ML IV SOLN
500.0000 [IU] | Freq: Once | INTRAVENOUS | Status: AC | PRN
  Administered 2016-03-25: 500 [IU]
  Filled 2016-03-25: qty 5

## 2016-03-25 MED ORDER — DIPHENHYDRAMINE HCL 25 MG PO CAPS
ORAL_CAPSULE | ORAL | Status: AC
  Filled 2016-03-25: qty 1

## 2016-03-25 MED ORDER — ZOLPIDEM TARTRATE 10 MG PO TABS: ORAL_TABLET | ORAL | 0 refills | Status: DC

## 2016-03-25 MED ORDER — PEGFILGRASTIM 6 MG/0.6ML ~~LOC~~ PSKT
6.0000 mg | PREFILLED_SYRINGE | Freq: Once | SUBCUTANEOUS | Status: AC
  Administered 2016-03-25: 6 mg via SUBCUTANEOUS
  Filled 2016-03-25: qty 0.6

## 2016-03-25 MED ORDER — PALONOSETRON HCL INJECTION 0.25 MG/5ML
0.2500 mg | Freq: Once | INTRAVENOUS | Status: AC
  Administered 2016-03-25: 0.25 mg via INTRAVENOUS

## 2016-03-25 NOTE — Patient Instructions (Signed)
[facility] Discharge Instructions for Patients Receiving Chemotherapy  Today you received the following chemotherapy agents Herceptin, Perjeta, Taxotere and Carboplatin, Neulasta. To help prevent nausea and vomiting after your treatment, we encourage you to take your nausea medication as directed.   If you develop nausea and vomiting that is not controlled by your nausea medication, call the clinic.   BELOW ARE SYMPTOMS THAT SHOULD BE REPORTED IMMEDIATELY:  *FEVER GREATER THAN 100.5 F  *CHILLS WITH OR WITHOUT FEVER  NAUSEA AND VOMITING THAT IS NOT CONTROLLED WITH YOUR NAUSEA MEDICATION  *UNUSUAL SHORTNESS OF BREATH  *UNUSUAL BRUISING OR BLEEDING  TENDERNESS IN MOUTH AND THROAT WITH OR WITHOUT PRESENCE OF ULCERS  *URINARY PROBLEMS  *BOWEL PROBLEMS  UNUSUAL RASH Items with * indicate a potential emergency and should be followed up as soon as possible.  Feel free to call the clinic you have any questions or concerns. The clinic phone number is [phone].  Please show the [facility] at check-in to the Emergency Department and triage nurse.

## 2016-03-25 NOTE — Progress Notes (Signed)
[place]  Telephone:([phone] [phone]) [phone]     ID: [name] DOB: August 09, 1975  MR#: [id number]  JAS#:[id number]  Patient Care Team: No Pcp Per Patient as PCP - General (General Practice) [name], MD as Consulting Physician (Oncology) [name], MD as Consulting Physician (General Surgery) [name], MD as Consulting Physician (Obstetrics and Gynecology) PCP: No PCP Per Patient OTHER MD:  CHIEF COMPLAINT: HER-2 positive, Estrogen receptor moderately positive breast cancer  CURRENT TREATMENT: neoadjuvant chemotherapy and anti-HER-2 immunotherapy   BREAST CANCER HISTORY: From the original intake note:  Catalyna woke up the morning of 11/09/2015 with some pain in her left axilla. She examined herself and found a lump in her left breast. She saw her gynecologist the same day, and she confirms a lump. The patient then proceeded directly to mammography. I do not have that report. However it did show the mass. [name] was then scheduled for ultrasound-guided biopsy 11/21/2015. This showed ([place] [phone] at the [facility] [employer] [facility] school of medicine) and invasive ductal carcinoma, grade 2 estrogen receptor positive, with moderate intensity (10-50%), progesterone receptor negative, and HER-2 equivocal by immunohistochemistry. Fish was obtained and was equivocal as well, with a signals ratio of 1.8 to, the number per cell being 5.7.  On 12/05/2015 the patient had a CT/ angiogram of the chest for evaluation of her left-sided chest pain. This showed no evidence of a clot. The left breast nodule measured 1.3 cm on the study. There was no enlarged axillary adenopathy. There was also no evidence of lung metastasis or blastic or destructive lytic bone lesions. The upper abdomen was unremarkable.  With that information the patient presents for further evaluation and treatment.  INTERVAL HISTORY: [name] returns today for follow-up of her  left-sided breast cancer. Today is day 1 cycle 5 of 6 planned doses of carboplatin, docetaxel, trastuzumab and pertuzumab given every 21 days, to be followed by trastuzumab alone for one year.  Since the last visit here [name] and her husband Will have discussed some of the stresses caused by the removed here and his having to live in a different city and she is now feeling much reassured regarding the relationship this is immediately apparent in her entire demeanor.  In late August also she was evaluated by gynecology and found the ovarian discomfort she was having was not associated with any evidence of malignancy. The ultrasound is copy below.  REVIEW OF SYSTEMS: [name] continues to tolerate treatment well. She does have muscle aches which are intermittent. She keeps a bit of a runny nose, hoarseness, and some shortness of breath with activity especially walking up stairs or up a slow. She says her appetite is poor. She has loose bowel movements about twice daily. She has back and joint pain which comes and goes. She feels forgetful. Hot flashes are a real problem. She is also having bone pain from the Neulasta. Tramadol takes care of that. She is trying to walk for exercise. Aside from these issues a detailed review of systems today was noncontributory  PAST MEDICAL HISTORY: Past Medical History:  Diagnosis Date  . Allergy   . Anxiety   . Asthma    childhood   . Breast cancer ([place]) 11/20/15   left breast  . Depression   . Endometriosis   . Family history of breast cancer   . GERD (gastroesophageal reflux disease)   . Herniated disc, cervical   . History of bronchitis as a child   .  History of stomach ulcers   . History of urinary tract infection   . Hormone disorder   . IBD (inflammatory bowel disease)   . Migraine without aura   . Neck pain   . PONV (postoperative nausea and vomiting)   . Shortness of breath dyspnea    anxiety     PAST SURGICAL HISTORY: Past Surgical History:    Procedure Laterality Date  . laparoscopically assisted vaginal hysterectomy  01/2006   Still has ovaries  . LAPAROSCOPY     x 2, diagnosed with endometriosis (prior to hysterectomy), only treated medically.  Marland Kitchen PORTACATH PLACEMENT Right 12/25/2015   Procedure: INSERTION PORT-A-CATH WITH ULTRA SOUND;  Surgeon: [name], MD;  Location: [facility];  Service: General;  Laterality: Right;    FAMILY HISTORY Family History  Problem Relation Age of Onset  . Sarcoidosis Mother   . Hypertension Father   . Throat cancer Maternal Grandfather     smoker and heavy drinker; dx in his late 65s-50s  . Cancer Paternal Grandmother     possible gatric vs bladder cancer  . Bladder Cancer Paternal Grandmother   . Breast cancer Paternal Aunt     dxin her 69s; dad's maternal half sister  . Breast cancer Other     PGFs mother  . Anesthesia problems Neg Hx   . Hypotension Neg Hx   . Malignant hyperthermia Neg Hx   . Pseudochol deficiency Neg Hx   The patient's parents are both living, in their early 23s. The patient has one brother and one sister. On the father's side 1 great grandmother was diagnosed with breast cancer in her 25s. The patient's father's mother was diagnosed with stomach cancer. One of the patient's father's sisters was diagnosed with breast cancer in her 51s. On the maternal side patient's mother's father was diagnosed with throat cancer at age.   GYNECOLOGIC HISTORY:  No LMP recorded. Patient has had a hysterectomy.  menarche age 29, first live birth age 62. The patient is GX P1. She stopped having periods in 2008, when she underwent a simple hysterectomy without salpingo-oophorectomy. She has a history of endometriosis and was started on Provera in March of this year, with her next dose due next week. (However she has decided to forego further Provera treatments at least for now).   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 He is licensed in [place] but not in [place]. He is now in this area looking for a job. The patient's son [name] is a Ship broker in music at [place]: Not in place   HEALTH MAINTENANCE: Social History  Substance Use Topics  . Smoking status: Never Smoker  . Smokeless tobacco: Never Used  . Alcohol use 0.0 oz/week     Comment: socially      Colonoscopy:  PAP:  Bone density:  Lipid panel:  Allergies  Allergen Reactions  . Amoxicillin Shortness Of Breath and Itching    Has patient had a PCN reaction causing immediate rash, facial/tongue/throat swelling, SOB or lightheadedness with hypotension: Yes Has patient had a PCN reaction causing severe rash involving mucus membranes or skin necrosis: No Has patient had a PCN reaction that required hospitalization Yes Has patient had a PCN reaction occurring within the last 10 years: No If all of the above answers are "  NO", then may proceed with Cephalosporin use.   . Diflucan [Fluconazole] Nausea Only    heartburn    Current Outpatient Prescriptions  Medication Sig Dispense Refill  . clotrimazole (MYCELEX) 10 MG troche Take 1 tablet (10 mg total) by mouth 5 (five) times daily. Dissolve in mouth and swish 25 tablet 0  . dexamethasone (DECADRON) 4 MG tablet Take 1 tablet (4 mg total) by mouth 2 (two) times daily. Start the day before Taxotere. Then again the day after chemo for 3 days. 30 tablet 1  . doxycycline (VIBRA-TABS) 100 MG tablet Take 1 tablet (100 mg total) by mouth daily. 60 tablet 1  . lidocaine-prilocaine (EMLA) cream Apply to affected area once 30 g 3  . LORazepam (ATIVAN) 0.5 MG tablet Take 1 tablet (0.5 mg total) by mouth at bedtime as needed (Nausea or vomiting). 30 tablet 0  . prochlorperazine (COMPAZINE) 10 MG tablet Take 1 tablet (10 mg total) by mouth every 6 (six) hours as needed (Nausea or  vomiting). 30 tablet 1  . sertraline (ZOLOFT) 100 MG tablet Take 1 tablet (100 mg total) by mouth daily. 30 tablet 3  . traMADol (ULTRAM) 50 MG tablet Take 1 tablet (50 mg total) by mouth every 6 (six) hours as needed for moderate pain. 60 tablet 1  . valACYclovir (VALTREX) 500 MG tablet Take 1 tablet (500 mg total) by mouth daily. 60 tablet 1  . zolpidem (AMBIEN) 10 MG tablet Take 1/2 to 1 tab po qhs prn 30 tablet 0   No current facility-administered medications for this visit.    Facility-Administered Medications Ordered in Other Visits  Medication Dose Route Frequency Provider Last Rate Last Dose  . sodium chloride flush (NS) 0.9 % injection 10 mL  10 mL Intracatheter PRN [name], MD   10 mL at 03/25/16 1526    OBJECTIVE: Early middle-aged African-American woman in no acute distress  Vitals:   03/25/16 1015  BP: (!) 146/90  Pulse: 93  Resp: 17  Temp: 97.2 F (36.2 C)     Body mass index is 33.56 kg/m.    ECOG FS:1 - Symptomatic but completely ambulatory Filed Weights   03/25/16 1015  Weight: 195 lb 8 oz (88.7 kg)    Sclerae unicteric, pupils round and equal Oropharynx clear and moist-- no thrush or other lesions No cervical or supraclavicular adenopathy Lungs no rales or rhonchi Heart regular rate and rhythm Abd soft, nontender, positive bowel sounds MSK no focal spinal tenderness, no upper extremity lymphedema Neuro: nonfocal, well oriented, appropriate affect Breasts: Deferred   LAB RESULTS:  CMP     Component Value Date/Time   NA 141 03/25/2016 1003   K 3.8 03/25/2016 1003   CL 107 12/04/2015 1858   CO2 23 03/25/2016 1003   GLUCOSE 132 03/25/2016 1003   BUN 14.9 03/25/2016 1003   CREATININE 0.9 03/25/2016 1003   CALCIUM 9.5 03/25/2016 1003   PROT 7.0 03/25/2016 1003   ALBUMIN 3.6 03/25/2016 1003   AST 17 03/25/2016 1003   ALT 19 03/25/2016 1003   ALKPHOS 64 03/25/2016 1003   BILITOT 0.32 03/25/2016 1003   GFRNONAA >60 12/04/2015 1858   GFRAA  >60 12/04/2015 1858    INo results found for: SPEP, UPEP  Lab Results  Component Value Date   WBC 11.2 (H) 03/25/2016   NEUTROABS 8.3 (H) 03/25/2016   HGB 9.8 (L) 03/25/2016   HCT 29.5 (L) 03/25/2016   MCV 93.7 03/25/2016   PLT 257 03/25/2016  Chemistry      Component Value Date/Time   NA 141 03/25/2016 1003   K 3.8 03/25/2016 1003   CL 107 12/04/2015 1858   CO2 23 03/25/2016 1003   BUN 14.9 03/25/2016 1003   CREATININE 0.9 03/25/2016 1003      Component Value Date/Time   CALCIUM 9.5 03/25/2016 1003   ALKPHOS 64 03/25/2016 1003   AST 17 03/25/2016 1003   ALT 19 03/25/2016 1003   BILITOT 0.32 03/25/2016 1003       No results found for: LABCA2  No components found for: LABCA125  No results for input(s): INR in the last 168 hours.  Urinalysis    Component Value Date/Time   COLORURINE STRAW (A) 12/11/2008 1923   APPEARANCEUR CLEAR 12/11/2008 1923   LABSPEC 1.020 12/11/2008 1923   PHURINE 6.0 12/11/2008 1923   GLUCOSEU NEGATIVE 12/11/2008 1923   HGBUR NEGATIVE 12/11/2008 1923   BILIRUBINUR NEGATIVE 12/11/2008 1923   KETONESUR NEGATIVE 12/11/2008 1923   PROTEINUR NEGATIVE 12/11/2008 1923   UROBILINOGEN 0.2 12/11/2008 1923   NITRITE NEGATIVE 12/11/2008 1923   LEUKOCYTESUR  12/11/2008 1923    NEGATIVE MICROSCOPIC NOT DONE ON URINES WITH NEGATIVE PROTEIN, BLOOD, LEUKOCYTES, NITRITE, OR GLUCOSE <1000 mg/dL.      ELIGIBLE FOR AVAILABLE RESEARCH PROTOCOL: no  STUDIES: US Transvaginal Non-ob  Result Date: 02/27/2016 CLINICAL DATA:  Pelvic pain in a patient status post hysterectomy EXAM: TRANSABDOMINAL AND TRANSVAGINAL ULTRASOUND OF PELVIS DOPPLER ULTRASOUND OF OVARIES TECHNIQUE: Both transabdominal and transvaginal ultrasound examinations of the pelvis were performed. Transabdominal technique was performed for global imaging of the pelvis including uterus, ovaries, adnexal regions, and pelvic cul-de-sac. It was necessary to proceed with endovaginal exam  following the transabdominal exam to visualize the ovaries. Color and duplex Doppler ultrasound was utilized to evaluate blood flow to the ovaries. COMPARISON:  06/12/2011. FINDINGS: Uterus Measurements: Surgically absent. Endometrium Thickness: N/A. Right ovary Measurements: 5.7 x 3.9 x 3.8 cm. 3.8 x 3.4 x 3.7 cm simple cyst identified within the ovary. Left ovary Measurements: 1.8 x 1.0 x 1.0 cm. Normal appearance/no adnexal mass. Pulsed Doppler evaluation of both ovaries demonstrates normal low-resistance arterial and venous waveforms. Other findings No abnormal free fluid. IMPRESSION: 1. 3.8 cm cyst with benign characteristics identified in the right adnexal space. In a premenopausal female, consensus criteria indicate no imaging follow-up necessary. This recommendation follows the consensus statement: Management of Asymptomatic Ovarian and Other Adnexal Cysts Imaged at [place]: Society of Radiologists in [place]. Radiology 2010; [phone]. 2. Status post hysterectomy. Electronically Signed   By: Misty Stanley M.D.   On: 02/27/2016 16:27   US Pelvis Complete  Result Date: 02/27/2016 CLINICAL DATA:  Pelvic pain in a patient status post hysterectomy EXAM: TRANSABDOMINAL AND TRANSVAGINAL ULTRASOUND OF PELVIS DOPPLER ULTRASOUND OF OVARIES TECHNIQUE: Both transabdominal and transvaginal ultrasound examinations of the pelvis were performed. Transabdominal technique was performed for global imaging of the pelvis including uterus, ovaries, adnexal regions, and pelvic cul-de-sac. It was necessary to proceed with endovaginal exam following the transabdominal exam to visualize the ovaries. Color and duplex Doppler ultrasound was utilized to evaluate blood flow to the ovaries. COMPARISON:  06/12/2011. FINDINGS: Uterus Measurements: Surgically absent. Endometrium Thickness: N/A. Right ovary Measurements: 5.7 x 3.9 x 3.8 cm. 3.8 x 3.4 x 3.7 cm simple cyst identified within the ovary. Left  ovary Measurements: 1.8 x 1.0 x 1.0 cm. Normal appearance/no adnexal mass. Pulsed Doppler evaluation of both ovaries demonstrates normal low-resistance arterial and venous waveforms. Other  findings No abnormal free fluid. IMPRESSION: 1. 3.8 cm cyst with benign characteristics identified in the right adnexal space. In a premenopausal female, consensus criteria indicate no imaging follow-up necessary. This recommendation follows the consensus statement: Management of Asymptomatic Ovarian and Other Adnexal Cysts Imaged at [place]: Society of Radiologists in [place]. Radiology 2010; [phone]. 2. Status post hysterectomy. Electronically Signed   By: Misty Stanley M.D.   On: 02/27/2016 16:27   [place] Art/ven Flow Abd Pelv Doppler  Result Date: 02/27/2016 CLINICAL DATA:  Pelvic pain in a patient status post hysterectomy EXAM: TRANSABDOMINAL AND TRANSVAGINAL ULTRASOUND OF PELVIS DOPPLER ULTRASOUND OF OVARIES TECHNIQUE: Both transabdominal and transvaginal ultrasound examinations of the pelvis were performed. Transabdominal technique was performed for global imaging of the pelvis including uterus, ovaries, adnexal regions, and pelvic cul-de-sac. It was necessary to proceed with endovaginal exam following the transabdominal exam to visualize the ovaries. Color and duplex Doppler ultrasound was utilized to evaluate blood flow to the ovaries. COMPARISON:  06/12/2011. FINDINGS: Uterus Measurements: Surgically absent. Endometrium Thickness: N/A. Right ovary Measurements: 5.7 x 3.9 x 3.8 cm. 3.8 x 3.4 x 3.7 cm simple cyst identified within the ovary. Left ovary Measurements: 1.8 x 1.0 x 1.0 cm. Normal appearance/no adnexal mass. Pulsed Doppler evaluation of both ovaries demonstrates normal low-resistance arterial and venous waveforms. Other findings No abnormal free fluid. IMPRESSION: 1. 3.8 cm cyst with benign characteristics identified in the right adnexal space. In a premenopausal female,  consensus criteria indicate no imaging follow-up necessary. This recommendation follows the consensus statement: Management of Asymptomatic Ovarian and Other Adnexal Cysts Imaged at [place]: Society of Radiologists in [place]. Radiology 2010; [phone]. 2. Status post hysterectomy. Electronically Signed   By: Misty Stanley M.D.   On: 02/27/2016 16:27    ASSESSMENT: 40 y.o. [place] woman status post left breast upper outer quadrant biopsy 11/20/2015 for a clinical T1c N0, stage IA invasive ductal carcinoma, grade 2, estrogen receptor moderately positive, progesterone receptor negative, HER-2 equivocal by both immunohistochemistry and FISH  (1) neoadjuvant tamoxifen started 12/11/2015, stopped at the start of chemotherapy  (2) genetics testing 01/03/2016 through the Breast/Ovarian gene panel offered by [facility] found no deleterious mutations in ATM, BARD1, BRCA1, BRCA2, BRIP1, CDH1, CHEK2, EPCAM, FANCC, MLH1, MSH2, MSH6, NBN, PALB2, PMS2, PTEN, RAD51C, RAD51D, TP53, and XRCC2  (3) neoadjuvant chemotherapy consisting of carboplatin, docetaxel, trastuzumab and pertuzumab every 21 days 6 starting 01/01/2016  (4) definitive surgery to follow  (5) adjuvant radiation to follow surgery   (6) anti-estrogens to be continued 5-10 years, more likely the latter    PLAN: [name] is proceeding to her fifth of 6 cycles of chemotherapy today. Of course she will need to continue trastuzumab for a total of a year, but the chemotherapy portion itself will be completed 3 weeks from now  Accordingly we started thinking ahead. I have scheduled her for an MRI a week after her last dose and I have alerted her surgeon so he can start planning her definitive surgery.  She we'll see me again in 3 weeks, with her final treatment. I am hoping for good results from her breast MRI. She knows to call for any problems that may develop for her next visit.    [name], MD   03/25/2016 4:55  PM Medical Oncology and Hematology [facility] [address] [place],  [address] Tel. [phone]    Fax. [phone]

## 2016-03-28 ENCOUNTER — Ambulatory Visit (HOSPITAL_COMMUNITY)
Admission: RE | Admit: 2016-03-28 | Discharge: 2016-03-28 | Disposition: A | Discharge status: Home / Self Care | Payer: BLUE CROSS/BLUE SHIELD | Source: Ambulatory Visit | Attending: Cardiology | Admitting: Cardiology

## 2016-03-28 ENCOUNTER — Ambulatory Visit (HOSPITAL_BASED_OUTPATIENT_CLINIC_OR_DEPARTMENT_OTHER)
Admission: RE | Admit: 2016-03-28 | Discharge: 2016-03-28 | Disposition: A | Discharge status: Home / Self Care | Payer: BLUE CROSS/BLUE SHIELD | Source: Ambulatory Visit | Attending: Cardiology | Admitting: Cardiology

## 2016-03-28 ENCOUNTER — Other Ambulatory Visit (HOSPITAL_COMMUNITY): Payer: Self-pay | Admitting: Cardiology

## 2016-03-28 VITALS — BP 148/90 | HR 91 | Wt 191.2 lb

## 2016-03-28 DIAGNOSIS — Z853 Personal history of malignant neoplasm of breast: Secondary | ICD-10-CM | POA: Diagnosis not present

## 2016-03-28 DIAGNOSIS — F419 Anxiety disorder, unspecified: Secondary | ICD-10-CM | POA: Diagnosis not present

## 2016-03-28 DIAGNOSIS — J45909 Unspecified asthma, uncomplicated: Secondary | ICD-10-CM | POA: Diagnosis not present

## 2016-03-28 DIAGNOSIS — C50412 Malignant neoplasm of upper-outer quadrant of left female breast: Secondary | ICD-10-CM | POA: Diagnosis not present

## 2016-03-28 DIAGNOSIS — Z79899 Other long term (current) drug therapy: Secondary | ICD-10-CM | POA: Insufficient documentation

## 2016-03-28 DIAGNOSIS — N809 Endometriosis, unspecified: Secondary | ICD-10-CM | POA: Insufficient documentation

## 2016-03-28 DIAGNOSIS — F329 Major depressive disorder, single episode, unspecified: Secondary | ICD-10-CM | POA: Insufficient documentation

## 2016-03-28 DIAGNOSIS — Z880 Allergy status to penicillin: Secondary | ICD-10-CM | POA: Insufficient documentation

## 2016-03-28 LAB — ECHOCARDIOGRAM LIMITED
E decel time: 183 msec
E/e' ratio: 7.36
FS: 44 % (ref 28–44)
IVS/LV PW RATIO, ED: 0.97
LA ID, A-P, ES: 36 mm
LA diam end sys: 36 mm
LA diam index: 1.86 cm/m2
LV E/e' medial: 7.36
LV E/e'average: 7.36
LV PW d: 8.99 mm — AB (ref 0.6–1.1)
LV e' LATERAL: 12 cm/s
LVOT area: 3.14 cm2
LVOT diameter: 20 mm
MV Dec: 183
MV Peak grad: 3 mmHg
MV pk A vel: 103 m/s
MV pk E vel: 88.3 m/s
TDI e' lateral: 12
TDI e' medial: 7.94

## 2016-03-28 NOTE — Progress Notes (Signed)
  Echocardiogram 2D Echocardiogram has been performed.  [name] 03/28/2016, 10:59 AM

## 2016-03-28 NOTE — Patient Instructions (Signed)
We will contact you in 3 months to schedule your next appointment and echocardiogram  

## 2016-03-30 NOTE — Progress Notes (Signed)
Patient ID: [name], female   DOB: Oct 12, 1975, 40 y.o.   MRN: [id number]   [facility] CONSULT NOTE  Referring Physician: Dr Jana Hakim  No Pcp Per Patient as PCP - General (General Practice) [name], MD as Consulting Physician (Oncology) [name], MD as Consulting Physician (General Surgery) [name], MD as Consulting Physician (Obstetrics and Gynecology)  HPI: 40 y.o. [place] woman status post left breast upper outer quadrant biopsy 11/20/2015 for a clinical T1c N0, stage IA invasive ductal carcinoma, grade 2, estrogen receptor moderately positive, progesterone receptor negative, HER-2 positive. Also has history of anxiety and endometriosis.    Relocated from [place] to [place] to live with her Mom while she is undergoing treatment. She is getting 6 cycles of carboplatin/docetaxel/trastuzumab/pertuzumab then will get Herceptin alone to complete 1 year.   BP is up a bit today but she is anxious.  SBP in 120s when she checks at home.  No exertional dyspnea but reports that she is generally fatigued.  No chest pain.   - Echo (9/17): EF 60-65%, lateral s' 12.8 cm/sec, GLS -20.1%.   FH: Grandmother - MI years ago.   Review of Systems:  All systems reviewed and negative except as per HPI.   Past Medical History:  Diagnosis Date  . Allergy   . Anxiety   . Asthma    childhood   . Breast cancer ([place]) 11/20/15   left breast  . Depression   . Endometriosis   . Family history of breast cancer   . GERD (gastroesophageal reflux disease)   . Herniated disc, cervical   . History of bronchitis as a child   . History of stomach ulcers   . History of urinary tract infection   . Hormone disorder   . IBD (inflammatory bowel disease)   . Migraine without aura   . Neck pain   . PONV (postoperative nausea and vomiting)   . Shortness of breath dyspnea    anxiety     Current Outpatient Prescriptions  Medication Sig Dispense Refill  .  clotrimazole (MYCELEX) 10 MG troche Take 1 tablet (10 mg total) by mouth 5 (five) times daily. Dissolve in mouth and swish 25 tablet 0  . dexamethasone (DECADRON) 4 MG tablet Take 1 tablet (4 mg total) by mouth 2 (two) times daily. Start the day before Taxotere. Then again the day after chemo for 3 days. 30 tablet 1  . doxycycline (VIBRA-TABS) 100 MG tablet Take 1 tablet (100 mg total) by mouth daily. 60 tablet 1  . lidocaine-prilocaine (EMLA) cream Apply to affected area once 30 g 3  . LORazepam (ATIVAN) 0.5 MG tablet Take 1 tablet (0.5 mg total) by mouth at bedtime as needed (Nausea or vomiting). 30 tablet 0  . prochlorperazine (COMPAZINE) 10 MG tablet Take 1 tablet (10 mg total) by mouth every 6 (six) hours as needed (Nausea or vomiting). 30 tablet 1  . sertraline (ZOLOFT) 100 MG tablet Take 1 tablet (100 mg total) by mouth daily. 30 tablet 3  . traMADol (ULTRAM) 50 MG tablet Take 1 tablet (50 mg total) by mouth every 6 (six) hours as needed for moderate pain. 60 tablet 1  . valACYclovir (VALTREX) 500 MG tablet Take 1 tablet (500 mg total) by mouth daily. 60 tablet 1  . zolpidem (AMBIEN) 10 MG tablet Take 1/2 to 1 tab po qhs prn 30 tablet 0   No current facility-administered medications for this encounter.     Allergies  Allergen Reactions  . Amoxicillin Shortness Of Breath and Itching    Has patient had a PCN reaction causing immediate rash, facial/tongue/throat swelling, SOB or lightheadedness with hypotension: Yes Has patient had a PCN reaction causing severe rash involving mucus membranes or skin necrosis: No Has patient had a PCN reaction that required hospitalization Yes Has patient had a PCN reaction occurring within the last 10 years: No If all of the above answers are "NO", then may proceed with Cephalosporin use.   . Diflucan [Fluconazole] Nausea Only    heartburn      Social History   Social History  . Marital status: Married    Spouse name: N/A  . Number of children: 1   . Years of education: N/A   Occupational History  . Not on file.   Social History Main Topics  . Smoking status: Never Smoker  . Smokeless tobacco: Never Used  . Alcohol use 0.0 oz/week     Comment: socially   . Drug use: No  . Sexual activity: Yes    Partners: Male   Other Topics Concern  . Not on file   Social History Narrative  . No narrative on file      Family History  Problem Relation Age of Onset  . Sarcoidosis Mother   . Hypertension Father   . Throat cancer Maternal Grandfather     smoker and heavy drinker; dx in his late 37s-50s  . Cancer Paternal Grandmother     possible gatric vs bladder cancer  . Bladder Cancer Paternal Grandmother   . Breast cancer Paternal Aunt     dxin her 82s; dad's maternal half sister  . Breast cancer Other     PGFs mother  . Anesthesia problems Neg Hx   . Hypotension Neg Hx   . Malignant hyperthermia Neg Hx   . Pseudochol deficiency Neg Hx     Vitals:   03/28/16 1057  BP: (!) 148/90  Pulse: 91  SpO2: 98%  Weight: 191 lb 4 oz (86.8 kg)    PHYSICAL EXAM: General:  Well appearing. No respiratory difficulty HEENT: normal Neck: supple. no JVD. Carotids 2+ bilat; no bruits. No lymphadenopathy or thryomegaly appreciated. Cor: PMI nondisplaced. Regular rate & rhythm. No rubs, gallops.  1/6 early SEM RUB. R upper chest scar from porta cath.  Lungs: clear Abdomen: soft, nontender, nondistended. No hepatosplenomegaly. No bruits or masses. Good bowel sounds. Extremities: no cyanosis, clubbing, rash, edema Neuro: alert & oriented x 3, cranial nerves grossly intact. moves all 4 extremities w/o difficulty. Affect pleasant.   ASSESSMENT & PLAN: L Breast Cancer : T1c N0, stage IA invasive ductal carcinoma, grade 2, estrogen receptor moderately positive, progesterone receptor negative, HER-2+.  She will be getting Herceptin-based therapy for 1 year.  I reviewed today's echo.  EF, strain, and lateral s' are stable compared to prior. She  will continue Herceptin and will get repeat echo in 2 months.   Loralie Champagne 03/30/2016

## 2016-03-31 ENCOUNTER — Telehealth: Payer: Self-pay | Admitting: *Deleted

## 2016-03-31 ENCOUNTER — Other Ambulatory Visit: Payer: Self-pay | Admitting: Oncology

## 2016-03-31 DIAGNOSIS — C50412 Malignant neoplasm of upper-outer quadrant of left female breast: Secondary | ICD-10-CM

## 2016-03-31 NOTE — Telephone Encounter (Signed)
Patient needs MRI of the Breast order to be changed to "MRI Breast W/WO contrast".

## 2016-03-31 NOTE — Telephone Encounter (Signed)
Spoke with patient and gave message from Dr. Talbert Nan -eh

## 2016-03-31 NOTE — Telephone Encounter (Signed)
Left message for patient to call regarding message below from Dr. Algie Coffer,  Please let the patient know that I looked through her oncologists note. He recommended holding off on removing her ovaries until after her lumpectomy. She can discuss it with the breast surgeon (she was wanting to combine the surgeries).

## 2016-04-01 ENCOUNTER — Other Ambulatory Visit: Payer: Self-pay | Admitting: *Deleted

## 2016-04-01 MED ORDER — TRAMADOL HCL 50 MG PO TABS: 50.0000 mg | ORAL_TABLET | Freq: Four times a day (QID) | ORAL | 1 refills | Status: DC | PRN

## 2016-04-01 NOTE — Progress Notes (Signed)
Counseling Session Notes :  Met with patient today at 11am for a counseling session. Pt expressed feelings of obligation to take care of the needs of others in the midst of her treatment; pt shared that she often feels guilty when she expresses her needs to her family and close friends. Counselor and Pt discussed the emotional and physical toll this obligation was having on the Pt.  Pt has been experiencing vivid dreams of her life before and after cancer, these dreams provide peace and joy but pt wants to be careful not to treat sleep as a way to escape reality. Pt shared that she was receiving some validation and empathy from online communities of cancer survivors.  Pt left the session feeling resolved to state her needs more clearly to those in her close community and to view her self-care as a success. Pt plans to opt out of an upcoming social event, despite the anticipated disappointment of others.  Next counseling session scheduled for 10/5.  Barrie Johnson, Counseling Intern Direct Supervisor, Lisa Lundeen, Lead Chaplain  

## 2016-04-02 ENCOUNTER — Other Ambulatory Visit: Payer: Self-pay | Admitting: *Deleted

## 2016-04-02 ENCOUNTER — Encounter: Payer: Self-pay | Admitting: Nurse Practitioner

## 2016-04-02 ENCOUNTER — Ambulatory Visit (HOSPITAL_BASED_OUTPATIENT_CLINIC_OR_DEPARTMENT_OTHER): Payer: BLUE CROSS/BLUE SHIELD

## 2016-04-02 ENCOUNTER — Telehealth: Payer: Self-pay | Admitting: *Deleted

## 2016-04-02 ENCOUNTER — Telehealth: Payer: Self-pay | Admitting: Nurse Practitioner

## 2016-04-02 ENCOUNTER — Ambulatory Visit (HOSPITAL_BASED_OUTPATIENT_CLINIC_OR_DEPARTMENT_OTHER): Payer: BLUE CROSS/BLUE SHIELD | Admitting: Nurse Practitioner

## 2016-04-02 VITALS — BP 113/78 | HR 101

## 2016-04-02 VITALS — BP 122/88 | HR 113 | Temp 98.4°F | Resp 19 | Ht 64.0 in

## 2016-04-02 DIAGNOSIS — E860 Dehydration: Secondary | ICD-10-CM | POA: Diagnosis not present

## 2016-04-02 DIAGNOSIS — C50412 Malignant neoplasm of upper-outer quadrant of left female breast: Secondary | ICD-10-CM

## 2016-04-02 DIAGNOSIS — R197 Diarrhea, unspecified: Secondary | ICD-10-CM

## 2016-04-02 DIAGNOSIS — R1115 Cyclical vomiting syndrome unrelated to migraine: Secondary | ICD-10-CM

## 2016-04-02 DIAGNOSIS — R112 Nausea with vomiting, unspecified: Secondary | ICD-10-CM

## 2016-04-02 LAB — COMPREHENSIVE METABOLIC PANEL
ALT: 30 U/L (ref 0–55)
AST: 25 U/L (ref 5–34)
Albumin: 3.8 g/dL (ref 3.5–5.0)
Alkaline Phosphatase: 102 U/L (ref 40–150)
Anion Gap: 13 mEq/L — ABNORMAL HIGH (ref 3–11)
BUN: 6.9 mg/dL — ABNORMAL LOW (ref 7.0–26.0)
CO2: 21 mEq/L — ABNORMAL LOW (ref 22–29)
Calcium: 9.6 mg/dL (ref 8.4–10.4)
Chloride: 105 mEq/L (ref 98–109)
Creatinine: 0.8 mg/dL (ref 0.6–1.1)
EGFR: >90 mL/min/{1.73_m2} (ref >90)
Glucose: 97 mg/dl (ref 70–140)
Potassium: 3.6 mEq/L (ref 3.5–5.1)
Sodium: 139 mEq/L (ref 136–145)
Total Bilirubin: 0.31 mg/dL (ref 0.20–1.20)
Total Protein: 7.4 g/dL (ref 6.4–8.3)

## 2016-04-02 LAB — CBC WITH DIFFERENTIAL/PLATELET
BASO%: 0.3 % (ref 0.0–2.0)
Basophils Absolute: 0 10*3/uL (ref 0.0–0.1)
EOS%: 0.1 % (ref 0.0–7.0)
Eosinophils Absolute: 0 10*3/uL (ref 0.0–0.5)
HCT: 31.5 % — ABNORMAL LOW (ref 34.8–46.6)
HGB: 10.9 g/dL — ABNORMAL LOW (ref 11.6–15.9)
LYMPH%: 28.3 % (ref 14.0–49.7)
MCH: 31.8 pg (ref 25.1–34.0)
MCHC: 34.6 g/dL (ref 31.5–36.0)
MCV: 91.8 fL (ref 79.5–101.0)
MONO#: 2.5 10*3/uL — ABNORMAL HIGH (ref 0.1–0.9)
MONO%: 23.8 % — ABNORMAL HIGH (ref 0.0–14.0)
NEUT#: 5.1 10*3/uL (ref 1.5–6.5)
NEUT%: 47.5 % (ref 38.4–76.8)
Platelets: 213 10*3/uL (ref 145–400)
RBC: 3.43 10*6/uL — ABNORMAL LOW (ref 3.70–5.45)
RDW: 16.3 % — ABNORMAL HIGH (ref 11.2–14.5)
WBC: 10.7 10*3/uL — ABNORMAL HIGH (ref 3.9–10.3)
lymph#: 3 10*3/uL (ref 0.9–3.3)

## 2016-04-02 LAB — MAGNESIUM: Magnesium: 1.6 mg/dl (ref 1.5–2.5)

## 2016-04-02 LAB — DRAW EXTRA CLOT TUBE

## 2016-04-02 MED ORDER — DIPHENOXYLATE-ATROPINE 2.5-0.025 MG PO TABS: 2.0000 | ORAL_TABLET | Freq: Once | ORAL | Status: DC

## 2016-04-02 MED ORDER — HEPARIN SOD (PORK) LOCK FLUSH 100 UNIT/ML IV SOLN
500.0000 [IU] | Freq: Once | INTRAVENOUS | Status: AC
  Administered 2016-04-02: 500 [IU] via INTRAVENOUS
  Filled 2016-04-02: qty 5

## 2016-04-02 MED ORDER — DIPHENOXYLATE-ATROPINE 2.5-0.025 MG PO TABS: 2.0000 | ORAL_TABLET | Freq: Four times a day (QID) | ORAL | 0 refills | Status: DC | PRN

## 2016-04-02 MED ORDER — LORAZEPAM 2 MG/ML IJ SOLN
INTRAMUSCULAR | Status: AC
  Filled 2016-04-02: qty 1

## 2016-04-02 MED ORDER — SODIUM CHLORIDE 0.9 % IV SOLN
INTRAVENOUS | Status: DC
  Administered 2016-04-02: 13:00:00 via INTRAVENOUS

## 2016-04-02 MED ORDER — SODIUM CHLORIDE 0.9% FLUSH
10.0000 mL | Freq: Once | INTRAVENOUS | Status: AC
  Administered 2016-04-02: 10 mL via INTRAVENOUS
  Filled 2016-04-02: qty 10

## 2016-04-02 MED ORDER — ONDANSETRON HCL 8 MG PO TABS: 8.0000 mg | ORAL_TABLET | Freq: Three times a day (TID) | ORAL | 2 refills | Status: DC | PRN

## 2016-04-02 MED ORDER — DIPHENOXYLATE-ATROPINE 2.5-0.025 MG PO TABS
2.0000 | ORAL_TABLET | Freq: Once | ORAL | Status: AC
  Administered 2016-04-02: 2 via ORAL

## 2016-04-02 MED ORDER — DIPHENOXYLATE-ATROPINE 2.5-0.025 MG PO TABS
ORAL_TABLET | ORAL | Status: AC
  Filled 2016-04-02: qty 2

## 2016-04-02 MED ORDER — SODIUM CHLORIDE 0.9 % IV SOLN
Freq: Once | INTRAVENOUS | Status: AC
  Administered 2016-04-02: 14:00:00 via INTRAVENOUS
  Filled 2016-04-02: qty 4

## 2016-04-02 MED ORDER — LORAZEPAM 2 MG/ML IJ SOLN
0.5000 mg | Freq: Once | INTRAMUSCULAR | Status: AC
  Administered 2016-04-02: 0.5 mg via INTRAVENOUS

## 2016-04-02 NOTE — Assessment & Plan Note (Signed)
Patient states she's also been having some intermittent diarrhea as well.  She's been taking Imodium with minimal effectiveness.  She was given Lomotil tablets all at the [facility]; and also given a prescription for Lomotil to alternate with the Imodium.

## 2016-04-02 NOTE — Assessment & Plan Note (Signed)
Patient states that she has been nauseous and vomiting on multiple occasions for the past 24-48 hours.  She has been taking Ativan at home for nausea with no effectiveness.  She was actually vomiting while at the [facility] today.  She's also dry heaving as well.  Patient appeared very uncomfortable and was dry heaving on initial exam.  She was given Zofran and Ativan IV; as well as IV fluid rehydration.  She stated that she felt much improved after receiving IV fluids and the anti--nausea medications.  Confirmed the patient did not have Zofran at home to use; so.  Patient was given a prescription for Zofran to use at home as well.  Also, patient was advised that she can take the Ativan 3 times per day on an as-needed basis for her nausea.

## 2016-04-02 NOTE — Patient Instructions (Signed)

## 2016-04-02 NOTE — Telephone Encounter (Signed)
This RN returned call to pt's mother per her VM stating ongoing nausea, diarrhea with vomiting pm 9/26.  Last episode of vomiting was 8pm 9/26 and diarrhea sometime this AM.  Pt now primarily nauseated.  This RN reviewed medications for antinausea with pt not realizing the prochlorperazine is for nausea - [name] will take a dose now.  She has decadron in the home if needed - advised not to take at present - may need if nausea persist post use of above med.  [name] states she has used imodium for diarrhea " but really doesn't seem to help "  Per discussion plan is for pt to use prochlorperazine now - will attempt to rehydrate - offered for her to be scheduled for IVF but [name] states she would like to stay at home and manage symptoms. She and her mom understand to call if symptoms are not controlled well.

## 2016-04-02 NOTE — Telephone Encounter (Signed)
lvm to inform pt of [facility] appt today per LOS

## 2016-04-02 NOTE — Progress Notes (Signed)
SYMPTOM MANAGEMENT CLINIC    Chief Complaint: Nausea, vomiting, diarrhea, dehydration  HPI:  [name] 40 y.o. female diagnosed with breast cancer.  Currently Taxotere/carboplatinum/Perjeta/Herceptin chemotherapy regimen.    No history exists.    Review of Systems  Constitutional: Positive for malaise/fatigue and weight loss.  Gastrointestinal: Positive for diarrhea, nausea and vomiting.  Neurological: Positive for weakness.  All other systems reviewed and are negative.   Past Medical History:  Diagnosis Date  . Allergy   . Anxiety   . Asthma    childhood   . Breast cancer ([place]) 11/20/15   left breast  . Depression   . Endometriosis   . Family history of breast cancer   . GERD (gastroesophageal reflux disease)   . Herniated disc, cervical   . History of bronchitis as a child   . History of stomach ulcers   . History of urinary tract infection   . Hormone disorder   . IBD (inflammatory bowel disease)   . Migraine without aura   . Neck pain   . PONV (postoperative nausea and vomiting)   . Shortness of breath dyspnea    anxiety     Past Surgical History:  Procedure Laterality Date  . laparoscopically assisted vaginal hysterectomy  01/2006   Still has ovaries  . LAPAROSCOPY     x 2, diagnosed with endometriosis (prior to hysterectomy), only treated medically.  [facility] PORTACATH PLACEMENT Right 12/25/2015   Procedure: INSERTION PORT-A-CATH WITH ULTRA SOUND;  Surgeon: [name], MD;  Location: [facility];  Service: General;  Laterality: Right;    has Breast cancer of upper-outer quadrant of left female breast ([place]); Family history of breast cancer; Genetic testing; Nausea with vomiting; Diarrhea; and Dehydration on her problem list.    is allergic to amoxicillin and diflucan [fluconazole].    Medication List       Accurate as of 04/02/16  6:06 PM. Always use your most recent med list.          clotrimazole 10 MG troche Commonly known as:   MYCELEX Take 1 tablet (10 mg total) by mouth 5 (five) times daily. Dissolve in mouth and swish   dexamethasone 4 MG tablet Commonly known as:  DECADRON Take 1 tablet (4 mg total) by mouth 2 (two) times daily. Start the day before Taxotere. Then again the day after chemo for 3 days.   diphenoxylate-atropine 2.5-0.025 MG tablet Commonly known as:  LOMOTIL Take 2 tablets by mouth 4 (four) times daily as needed for diarrhea or loose stools.   doxycycline 100 MG tablet Commonly known as:  VIBRA-TABS Take 1 tablet (100 mg total) by mouth daily.   lidocaine-prilocaine cream Commonly known as:  EMLA Apply to affected area once   LORazepam 0.5 MG tablet Commonly known as:  ATIVAN Take 1 tablet (0.5 mg total) by mouth at bedtime as needed (Nausea or vomiting).   ondansetron 8 MG tablet Commonly known as:  ZOFRAN Take 1 tablet (8 mg total) by mouth every 8 (eight) hours as needed for nausea or vomiting.   prochlorperazine 10 MG tablet Commonly known as:  COMPAZINE Take 1 tablet (10 mg total) by mouth every 6 (six) hours as needed (Nausea or vomiting).   sertraline 100 MG tablet Commonly known as:  ZOLOFT Take 1 tablet (100 mg total) by mouth daily.   traMADol 50 MG tablet Commonly known as:  ULTRAM Take 1 tablet (50 mg total) by mouth every 6 (six) hours as needed for  moderate pain.   valACYclovir 500 MG tablet Commonly known as:  VALTREX Take 1 tablet (500 mg total) by mouth daily.   zolpidem 10 MG tablet Commonly known as:  AMBIEN Take 1/2 to 1 tab po qhs prn        PHYSICAL EXAMINATION  Oncology Vitals 04/02/2016 04/02/2016  Height - 163 cm  Weight - -  Weight (lbs) - -  BMI (kg/m2) - -  Temp - 98.4  Pulse 101 113  Resp - 19  SpO2 98 100  BSA (m2) - -   BP Readings from Last 2 Encounters:  04/02/16 122/88  04/02/16 113/78    Physical Exam  Constitutional: She is oriented to person, place, and time. She appears dehydrated. She appears unhealthy. She appears  distressed.  HENT:  Head: Normocephalic and atraumatic.  Eyes: Conjunctivae and EOM are normal. Pupils are equal, round, and reactive to light.  Neck: Normal range of motion.  Pulmonary/Chest: Effort normal. No respiratory distress.  Musculoskeletal: Normal range of motion.  Neurological: She is alert and oriented to person, place, and time.  Skin: Skin is warm.  Psychiatric: Affect normal.  Nursing note and vitals reviewed.   LABORATORY DATA:. Appointment on 04/02/2016  Component Date Value Ref Range Status  . WBC 04/02/2016 10.7* 3.9 - 10.3 10e3/uL Final  . NEUT# 04/02/2016 5.1  1.5 - 6.5 10e3/uL Final  . HGB 04/02/2016 10.9* 11.6 - 15.9 g/dL Final  . HCT 16/00/0297 31.5* 34.8 - 46.6 % Final  . Platelets 04/02/2016 213  145 - 400 10e3/uL Final  . MCV 04/02/2016 91.8  79.5 - 101.0 fL Final  . MCH 04/02/2016 31.8  25.1 - 34.0 pg Final  . MCHC 04/02/2016 34.6  31.5 - 36.0 g/dL Final  . RBC 04/91/1435 3.43* 3.70 - 5.45 10e6/uL Final  . RDW 04/02/2016 16.3* 11.2 - 14.5 % Final  . lymph# 04/02/2016 3.0  0.9 - 3.3 10e3/uL Final  . MONO# 04/02/2016 2.5* 0.1 - 0.9 10e3/uL Final  . Eosinophils Absolute 04/02/2016 0.0  0.0 - 0.5 10e3/uL Final  . Basophils Absolute 04/02/2016 0.0  0.0 - 0.1 10e3/uL Final  . NEUT% 04/02/2016 47.5  38.4 - 76.8 % Final  . LYMPH% 04/02/2016 28.3  14.0 - 49.7 % Final  . MONO% 04/02/2016 23.8* 0.0 - 14.0 % Final  . EOS% 04/02/2016 0.1  0.0 - 7.0 % Final  . BASO% 04/02/2016 0.3  0.0 - 2.0 % Final  . Sodium 04/02/2016 139  136 - 145 mEq/L Final  . Potassium 04/02/2016 3.6  3.5 - 5.1 mEq/L Final  . Chloride 04/02/2016 105  98 - 109 mEq/L Final  . CO2 04/02/2016 21* 22 - 29 mEq/L Final  . Glucose 04/02/2016 97  70 - 140 mg/dl Final  . BUN 51/16/1327 6.9* 7.0 - 26.0 mg/dL Final  . Creatinine 77/24/7214 0.8  0.6 - 1.1 mg/dL Final  . Total Bilirubin 04/02/2016 0.31  0.20 - 1.20 mg/dL Final  . Alkaline Phosphatase 04/02/2016 102  40 - 150 U/L Final  . AST  04/02/2016 25  5 - 34 U/L Final  . ALT 04/02/2016 30  0 - 55 U/L Final  . Total Protein 04/02/2016 7.4  6.4 - 8.3 g/dL Final  . Albumin 75/33/8949 3.8  3.5 - 5.0 g/dL Final  . Calcium 55/12/5063 9.6  8.4 - 10.4 mg/dL Final  . Anion Gap 29/03/5046 13* 3 - 11 mEq/L Final  . EGFR 04/02/2016 >90  >90 ml/min/1.73 m2 Final  . Magnesium  04/02/2016 1.6  1.5 - 2.5 mg/dl Final  . Extra Tube 04/02/2016 Sample held in lab for 7 days   Final    RADIOGRAPHIC STUDIES: No results found.  ASSESSMENT/PLAN:    Nausea with vomiting Patient states that she has been nauseous and vomiting on multiple occasions for the past 24-48 hours.  She has been taking Ativan at home for nausea with no effectiveness.  She was actually vomiting while at the [facility] [place] today.  She's also dry heaving as well.  Patient appeared very uncomfortable and was dry heaving on initial exam.  She was given Zofran and Ativan IV; as well as IV fluid rehydration.  She stated that she felt much improved after receiving IV fluids and the anti--nausea medications.  Confirmed the patient did not have Zofran at home to use; so.  Patient was given a prescription for Zofran to use at home as well.  Also, patient was advised that she can take the Ativan 3 times per day on an as-needed basis for her nausea.  Diarrhea Patient states she's also been having some intermittent diarrhea as well.  She's been taking Imodium with minimal effectiveness.  She was given Lomotil tablets all at the [facility]; and also given a prescription for Lomotil to alternate with the Imodium.  Dehydration Patient states that she has been nauseous and vomiting on multiple occasions for the past 24-48 hours.  She has been taking Ativan at home for nausea with no effectiveness.  She was actually vomiting while at the [place] today.  She's also dry heaving as well.  Patient appeared very uncomfortable and was dry heaving on initial exam.  She was given Zofran and  Ativan IV; as well as IV fluid rehydration.  She stated that she felt much improved after receiving IV fluids and the anti--nausea medications.  Confirmed the patient did not have Zofran at home to use; so.  Patient was given a prescription for Zofran to use at home as well.  Also, patient was advised that she can take the Ativan 3 times per day on an as-needed basis for her nausea.  Breast cancer of upper-outer quadrant of left female breast [facility]) Patient received cycle 5 of her Taxotere/carboplatin/Herceptin/Perjeta chemotherapy regimen on 03/25/2016.  She is scheduled to return for labs and her next cycle of chemotherapy on 04/15/2016.  Will need to confirm with Dr. Jana Hakim if patient needs a follow-up visit scheduled as well.   Patient stated understanding of all instructions; and was in agreement with this plan of care. The patient knows to call the clinic with any problems, questions or concerns.   Total time spent with patient was 25 minutes;  with greater than 75 percent of that time spent in face to face counseling regarding patient's symptoms,  and coordination of care and follow up.  Disclaimer:This dictation was prepared with Dragon/digital dictation along with [employer]. Any transcriptional errors that result from this process are unintentional.  Drue Second, NP 04/02/2016

## 2016-04-02 NOTE — Assessment & Plan Note (Signed)
Patient received cycle 5 of her Taxotere/carboplatin/Herceptin/Perjeta chemotherapy regimen on 03/25/2016.  She is scheduled to return for labs and her next cycle of chemotherapy on 04/15/2016.  Will need to confirm with Dr. Jana Hakim if patient needs a follow-up visit scheduled as well.

## 2016-04-02 NOTE — Assessment & Plan Note (Signed)
Patient states that she has been nauseous and vomiting on multiple occasions for the past 24-48 hours.  She has been taking Ativan at home for nausea with no effectiveness.  She was actually vomiting while at the [place] today.  She's also dry heaving as well.  Patient appeared very uncomfortable and was dry heaving on initial exam.  She was given Zofran and Ativan IV; as well as IV fluid rehydration.  She stated that she felt much improved after receiving IV fluids and the anti--nausea medications.  Confirmed the patient did not have Zofran at home to use; so.  Patient was given a prescription for Zofran to use at home as well.  Also, patient was advised that she can take the Ativan 3 times per day on an as-needed basis for her nausea.

## 2016-04-03 ENCOUNTER — Other Ambulatory Visit: Payer: Self-pay | Admitting: Oncology

## 2016-04-09 ENCOUNTER — Telehealth: Payer: Self-pay | Admitting: Nurse Practitioner

## 2016-04-09 NOTE — Telephone Encounter (Signed)
Called patient to check in with her; but there was no answer.  A voicemail was left for the patient to call/return or go directly to the emergency department with any new or worsening symptoms whatsoever.  Patient is scheduled to return for labs, visit, and her next cycle of chemotherapy on 04/15/2016.

## 2016-04-10 NOTE — Progress Notes (Signed)
(  late entry) Counseling Session - 4pm on 04/09/16  Met with patient for counseling session, 10/4. Pt shared how helpful it was for her to remain consistent in our counseling sessions; pt reflected that these sessions were helping her "sort out (her) thoughts" and offered a space to be vulnerable. Pt shared that she had recently initiated a conversation with her mother in which she was able to name her emotional needs. Pt was proud to share that she had declined to attend a recent social event in order to rest.  Pt and counselor discussed pt's relationship with her husband and ways to better connect with him while also naming her needs. Pt hopes that her husband can speak with her doctor during her final chemo treatment on 10/31.  Pt continues her work to believe that she is not responsible for the feelings of others. Pt and counselor revisited ongoing dialogue about ways for the pt to experience freedom and authenticity in her close relationships.  Next session scheduled for 04/17/16.  Rosana Fret, Counseling Mudlogger, Lorrin Jackson, Lead Chaplain

## 2016-04-12 ENCOUNTER — Other Ambulatory Visit: Payer: Self-pay | Admitting: Oncology

## 2016-04-15 ENCOUNTER — Encounter: Payer: Self-pay | Admitting: *Deleted

## 2016-04-15 ENCOUNTER — Ambulatory Visit (HOSPITAL_BASED_OUTPATIENT_CLINIC_OR_DEPARTMENT_OTHER): Payer: BLUE CROSS/BLUE SHIELD

## 2016-04-15 ENCOUNTER — Other Ambulatory Visit (HOSPITAL_BASED_OUTPATIENT_CLINIC_OR_DEPARTMENT_OTHER): Payer: BLUE CROSS/BLUE SHIELD

## 2016-04-15 ENCOUNTER — Ambulatory Visit (HOSPITAL_BASED_OUTPATIENT_CLINIC_OR_DEPARTMENT_OTHER): Payer: BLUE CROSS/BLUE SHIELD | Admitting: Oncology

## 2016-04-15 DIAGNOSIS — Z170 Estrogen receptor positive status [ER+]: Secondary | ICD-10-CM

## 2016-04-15 DIAGNOSIS — Z5112 Encounter for antineoplastic immunotherapy: Secondary | ICD-10-CM | POA: Diagnosis not present

## 2016-04-15 DIAGNOSIS — C50412 Malignant neoplasm of upper-outer quadrant of left female breast: Secondary | ICD-10-CM

## 2016-04-15 LAB — CBC WITH DIFFERENTIAL/PLATELET
BASO%: 0.1 % (ref 0.0–2.0)
Basophils Absolute: 0 10*3/uL (ref 0.0–0.1)
EOS%: 0 % (ref 0.0–7.0)
Eosinophils Absolute: 0 10*3/uL (ref 0.0–0.5)
HCT: 30.5 % — ABNORMAL LOW (ref 34.8–46.6)
HGB: 10.2 g/dL — ABNORMAL LOW (ref 11.6–15.9)
LYMPH%: 9.1 % — ABNORMAL LOW (ref 14.0–49.7)
MCH: 32.2 pg (ref 25.1–34.0)
MCHC: 33.5 g/dL (ref 31.5–36.0)
MCV: 95.9 fL (ref 79.5–101.0)
MONO#: 0.8 10*3/uL (ref 0.1–0.9)
MONO%: 8.2 % (ref 0.0–14.0)
NEUT#: 8.4 10*3/uL — ABNORMAL HIGH (ref 1.5–6.5)
NEUT%: 82.6 % — ABNORMAL HIGH (ref 38.4–76.8)
Platelets: 224 10*3/uL (ref 145–400)
RBC: 3.18 10*6/uL — ABNORMAL LOW (ref 3.70–5.45)
RDW: 17.9 % — ABNORMAL HIGH (ref 11.2–14.5)
WBC: 10.1 10*3/uL (ref 3.9–10.3)
lymph#: 0.9 10*3/uL (ref 0.9–3.3)

## 2016-04-15 LAB — COMPREHENSIVE METABOLIC PANEL
ALT: 14 U/L (ref 0–55)
AST: 17 U/L (ref 5–34)
Albumin: 3.8 g/dL (ref 3.5–5.0)
Alkaline Phosphatase: 66 U/L (ref 40–150)
Anion Gap: 11 mEq/L (ref 3–11)
BUN: 11.5 mg/dL (ref 7.0–26.0)
CO2: 23 mEq/L (ref 22–29)
Calcium: 9.7 mg/dL (ref 8.4–10.4)
Chloride: 108 mEq/L (ref 98–109)
Creatinine: 0.9 mg/dL (ref 0.6–1.1)
EGFR: >90 mL/min/{1.73_m2} (ref >90)
Glucose: 144 mg/dl — ABNORMAL HIGH (ref 70–140)
Potassium: 3.5 mEq/L (ref 3.5–5.1)
Sodium: 142 mEq/L (ref 136–145)
Total Bilirubin: <0.3 mg/dL (ref 0.20–1.20)
Total Protein: 7.1 g/dL (ref 6.4–8.3)

## 2016-04-15 MED ORDER — ACETAMINOPHEN 325 MG PO TABS
ORAL_TABLET | ORAL | Status: AC
  Filled 2016-04-15: qty 2

## 2016-04-15 MED ORDER — SODIUM CHLORIDE 0.9% FLUSH
10.0000 mL | INTRAVENOUS | Status: DC | PRN
  Administered 2016-04-15: 10 mL
  Filled 2016-04-15: qty 10

## 2016-04-15 MED ORDER — SODIUM CHLORIDE 0.9 % IV SOLN
656.0000 mg | Freq: Once | INTRAVENOUS | Status: AC
  Administered 2016-04-15: 660 mg via INTRAVENOUS
  Filled 2016-04-15: qty 66

## 2016-04-15 MED ORDER — ACETAMINOPHEN 325 MG PO TABS
650.0000 mg | ORAL_TABLET | Freq: Once | ORAL | Status: AC
  Administered 2016-04-15: 650 mg via ORAL

## 2016-04-15 MED ORDER — PEGFILGRASTIM 6 MG/0.6ML ~~LOC~~ PSKT
6.0000 mg | PREFILLED_SYRINGE | Freq: Once | SUBCUTANEOUS | Status: AC
Start: 2016-04-15 — End: 2016-04-15
  Administered 2016-04-15: 6 mg via SUBCUTANEOUS
  Filled 2016-04-15: qty 0.6

## 2016-04-15 MED ORDER — PALONOSETRON HCL INJECTION 0.25 MG/5ML
INTRAVENOUS | Status: AC
  Filled 2016-04-15: qty 5

## 2016-04-15 MED ORDER — LORAZEPAM 0.5 MG PO TABS: 0.5000 mg | ORAL_TABLET | Freq: Every evening | ORAL | 0 refills | Status: DC | PRN

## 2016-04-15 MED ORDER — SODIUM CHLORIDE 0.9 % IV SOLN
Freq: Once | INTRAVENOUS | Status: AC
  Administered 2016-04-15: 10:00:00 via INTRAVENOUS

## 2016-04-15 MED ORDER — HEPARIN SOD (PORK) LOCK FLUSH 100 UNIT/ML IV SOLN
500.0000 [IU] | Freq: Once | INTRAVENOUS | Status: AC | PRN
  Administered 2016-04-15: 500 [IU]
  Filled 2016-04-15: qty 5

## 2016-04-15 MED ORDER — DIPHENHYDRAMINE HCL 25 MG PO CAPS
25.0000 mg | ORAL_CAPSULE | Freq: Once | ORAL | Status: AC
  Administered 2016-04-15: 25 mg via ORAL

## 2016-04-15 MED ORDER — TRASTUZUMAB CHEMO 150 MG IV SOLR
6.0000 mg/kg | Freq: Once | INTRAVENOUS | Status: AC
  Administered 2016-04-15: 504 mg via INTRAVENOUS
  Filled 2016-04-15: qty 9.79

## 2016-04-15 MED ORDER — DIPHENHYDRAMINE HCL 25 MG PO CAPS
ORAL_CAPSULE | ORAL | Status: AC
  Filled 2016-04-15: qty 1

## 2016-04-15 MED ORDER — SODIUM CHLORIDE 0.9 % IV SOLN
10.0000 mg | Freq: Once | INTRAVENOUS | Status: AC
  Administered 2016-04-15: 10 mg via INTRAVENOUS
  Filled 2016-04-15: qty 1

## 2016-04-15 MED ORDER — PALONOSETRON HCL INJECTION 0.25 MG/5ML
0.2500 mg | Freq: Once | INTRAVENOUS | Status: AC
  Administered 2016-04-15: 0.25 mg via INTRAVENOUS

## 2016-04-15 MED ORDER — SODIUM CHLORIDE 0.9 % IV SOLN
75.0000 mg/m2 | Freq: Once | INTRAVENOUS | Status: AC
  Administered 2016-04-15: 140 mg via INTRAVENOUS
  Filled 2016-04-15: qty 14

## 2016-04-15 NOTE — Progress Notes (Signed)
[place]  [phone] Fax:([phone]) [phone]     ID: [name] DOB: 06-20-1976  MR#: [id number]  XTK#:[id number]  Patient Care Team: No Pcp Per Patient as PCP - General (General Practice) Jordan Cruel, MD as Consulting Physician (Oncology) Jordan Bookbinder, MD as Consulting Physician (General Surgery) Jordan Dom, MD as Consulting Physician (Obstetrics and Gynecology) PCP: No PCP Per Patient OTHER MD:  CHIEF COMPLAINT: HER-2 positive, Estrogen receptor moderately positive breast cancer  CURRENT TREATMENT: neoadjuvant chemotherapy and anti-HER-2 immunotherapy   BREAST CANCER HISTORY: From the original intake note:  [name] woke up the morning of 11/09/2015 with some pain in her left axilla. She examined herself and found a lump in her left breast. She saw her gynecologist the same day, and she confirms a lump. The patient then proceeded directly to mammography. I do not have that report. However it did show the mass. [name] was then scheduled for ultrasound-guided biopsy 11/21/2015. This showed ([place] [phone] at the [facility]) and invasive ductal carcinoma, grade 2 estrogen receptor positive, with moderate intensity (10-50%), progesterone receptor negative, and HER-2 equivocal by immunohistochemistry. Fish was obtained and was equivocal as well, with a signals ratio of 1.8 to, the number per cell being 5.7.  On 12/05/2015 the patient had a CT/ angiogram of the chest for evaluation of her left-sided chest pain. This showed no evidence of a clot. The left breast nodule measured 1.3 cm on the study. There was no enlarged axillary adenopathy. There was also no evidence of lung metastasis or blastic or destructive lytic bone lesions. The upper abdomen was unremarkable.  With that information the patient presents for further evaluation and treatment.  INTERVAL HISTORY: [name] returns today for follow-up of her  HER-2/neu positive breast cancer. Today is day 1 cycle 6 of 6 planned doses of carboplatin, docetaxel, trastuzumab and pertuzumab given every 21 days, to be followed by trastuzumab alone for one year.  After her last treatment [name] had a very unpleasant episode of nausea, vomiting, and diarrhea, which significantly dehydrated her. She came here received IV fluids and antinausea medicines and did a bit better. She is very concerned that she is going to have the same thing happen with this final cycle.  REVIEW OF SYSTEMS: [name] has a little bit of a runny nose, but no pleurisy or cough. She is short of breath only when climbing stairs but she gets to the top without stopping. She does have some heartburn issues. She has chronic low back pain which is not more intense or persistent than usual. She feels forgetful at times. Otherwise a detailed review of systems today was stable  PAST MEDICAL HISTORY: Past Medical History:  Diagnosis Date  . Allergy   . Anxiety   . Asthma    childhood   . Breast cancer ([facility]) 11/20/15   left breast  . Depression   . Endometriosis   . Family history of breast cancer   . GERD (gastroesophageal reflux disease)   . Herniated disc, cervical   . History of bronchitis as a child   . History of stomach ulcers   . History of urinary tract infection   . Hormone disorder   . IBD (inflammatory bowel disease)   . Migraine without aura   . Neck pain   . PONV (postoperative nausea and vomiting)   . Shortness of breath dyspnea    anxiety     PAST SURGICAL HISTORY: Past Surgical  History:  Procedure Laterality Date  . laparoscopically assisted vaginal hysterectomy  01/2006   Still has ovaries  . LAPAROSCOPY     x 2, diagnosed with endometriosis (prior to hysterectomy), only treated medically.  Marland Kitchen PORTACATH PLACEMENT Right 12/25/2015   Procedure: INSERTION PORT-A-CATH WITH ULTRA SOUND;  Surgeon: Jordan Bookbinder, MD;  Location: [facility];  Service: General;  Laterality:  Right;    FAMILY HISTORY Family History  Problem Relation Age of Onset  . Sarcoidosis Mother   . Hypertension Father   . Throat cancer Maternal Grandfather     smoker and heavy drinker; dx in his late 55s-50s  . Cancer Paternal Grandmother     possible gatric vs bladder cancer  . Bladder Cancer Paternal Grandmother   . Breast cancer Paternal Aunt     dxin her 64s; dad's maternal half sister  . Breast cancer Other     PGFs mother  . Anesthesia problems Neg Hx   . Hypotension Neg Hx   . Malignant hyperthermia Neg Hx   . Pseudochol deficiency Neg Hx   The patient's parents are both living, in their early 55s. The patient has one brother and one sister. On the father's side 1 great grandmother was diagnosed with breast cancer in her 49s. The patient's father's mother was diagnosed with stomach cancer. One of the patient's father's sisters was diagnosed with breast cancer in her 72s. On the maternal side patient's mother's father was diagnosed with throat cancer at age.   GYNECOLOGIC HISTORY:  No LMP recorded. Patient has had a hysterectomy.  menarche age 3, first live birth age 55. The patient is GX P1. She stopped having periods in 2008, when she underwent a simple hysterectomy without salpingo-oophorectomy. She has a history of endometriosis and was started on Provera in March of this year, with her next dose due next week. (However she has decided to forego further Provera treatments at least for now).   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 He is licensed in [place] but not in [place]. He is now in this area looking for a job. The patient's son [name] is a Ship broker in music at [employer]: Not in place   HEALTH MAINTENANCE: Social History  Substance Use Topics  . Smoking status: Never Smoker    . Smokeless tobacco: Never Used  . Alcohol use 0.0 oz/week     Comment: socially      Colonoscopy:  PAP:  Bone density:  Lipid panel:  Allergies  Allergen Reactions  . Amoxicillin Shortness Of Breath and Itching    Has patient had a PCN reaction causing immediate rash, facial/tongue/throat swelling, SOB or lightheadedness with hypotension: Yes Has patient had a PCN reaction causing severe rash involving mucus membranes or skin necrosis: No Has patient had a PCN reaction that required hospitalization Yes Has patient had a PCN reaction occurring within the last 10 years: No If all of the above answers are "NO", then may proceed with Cephalosporin use.   . Diflucan [Fluconazole] Nausea Only    heartburn    Current Outpatient Prescriptions  Medication Sig Dispense Refill  . dexamethasone (DECADRON) 4 MG tablet Take 1 tablet (4 mg total) by mouth 2 (two) times daily. Start the day before Taxotere. Then again the day after chemo for 3 days. [place]  tablet 1  . diphenoxylate-atropine (LOMOTIL) 2.5-0.025 MG tablet Take 2 tablets by mouth 4 (four) times daily as needed for diarrhea or loose stools. 30 tablet 0  . doxycycline (VIBRA-TABS) 100 MG tablet Take 1 tablet (100 mg total) by mouth daily. 60 tablet 1  . lidocaine-prilocaine (EMLA) cream Apply to affected area once 30 g 3  . LORazepam (ATIVAN) 0.5 MG tablet Take 1 tablet (0.5 mg total) by mouth at bedtime as needed (Nausea or vomiting). 30 tablet 0  . ondansetron (ZOFRAN) 8 MG tablet Take 1 tablet (8 mg total) by mouth every 8 (eight) hours as needed for nausea or vomiting. 30 tablet 2  . prochlorperazine (COMPAZINE) 10 MG tablet Take 1 tablet (10 mg total) by mouth every 6 (six) hours as needed (Nausea or vomiting). 30 tablet 1  . sertraline (ZOLOFT) 100 MG tablet Take 1 tablet (100 mg total) by mouth daily. 30 tablet 3  . traMADol (ULTRAM) 50 MG tablet Take 1 tablet (50 mg total) by mouth every 6 (six) hours as needed for moderate pain.  60 tablet 1  . valACYclovir (VALTREX) 500 MG tablet Take 1 tablet (500 mg total) by mouth daily. 60 tablet 1  . zolpidem (AMBIEN) 10 MG tablet Take 1/2 to 1 tab po qhs prn 30 tablet 0   No current facility-administered medications for this visit.     OBJECTIVE: Early middle-aged African-American woman   Vitals:   04/15/16 0857  BP: (!) 149/97  Pulse: 97  Resp: 18  Temp: 97.7 F (36.5 C)     Body mass index is 33.16 kg/m.    ECOG FS:0 - Asymptomatic Filed Weights   04/15/16 0857  Weight: 193 lb 3.2 oz (87.6 kg)    Sclerae unicteric, EOMs intact Oropharynx clear and moist No cervical or supraclavicular adenopathy Lungs no rales or rhonchi Heart regular rate and rhythm Abd soft, nontender, positive bowel sounds MSK no focal spinal tenderness, no upper extremity lymphedema Neuro: nonfocal, well oriented, appropriate affect Breasts: The right breast is unremarkable. I do not palpate a mass in the left breast and there are no skin or nipple changes of concern. The left axilla is benign.    LAB RESULTS:  CMP     Component Value Date/Time   NA 139 04/02/2016 1318   K 3.6 04/02/2016 1318   CL 107 12/04/2015 1858   CO2 21 (L) 04/02/2016 1318   GLUCOSE 97 04/02/2016 1318   BUN 6.9 (L) 04/02/2016 1318   CREATININE 0.8 04/02/2016 1318   CALCIUM 9.6 04/02/2016 1318   PROT 7.4 04/02/2016 1318   ALBUMIN 3.8 04/02/2016 1318   AST 25 04/02/2016 1318   ALT 30 04/02/2016 1318   ALKPHOS 102 04/02/2016 1318   BILITOT 0.31 04/02/2016 1318   GFRNONAA >60 12/04/2015 1858   GFRAA >60 12/04/2015 1858    INo results found for: SPEP, UPEP  Lab Results  Component Value Date   WBC 10.7 (H) 04/02/2016   NEUTROABS 5.1 04/02/2016   HGB 10.9 (L) 04/02/2016   HCT 31.5 (L) 04/02/2016   MCV 91.8 04/02/2016   PLT 213 04/02/2016      Chemistry      Component Value Date/Time   NA 139 04/02/2016 1318   K 3.6 04/02/2016 1318   CL 107 12/04/2015 1858   CO2 21 (L) 04/02/2016 1318   BUN  6.9 (L) 04/02/2016 1318   CREATININE 0.8 04/02/2016 1318      Component Value Date/Time   CALCIUM 9.6  04/02/2016 1318   ALKPHOS 102 04/02/2016 1318   AST 25 04/02/2016 1318   ALT 30 04/02/2016 1318   BILITOT 0.31 04/02/2016 1318       No results found for: LABCA2  No components found for: LABCA125  No results for input(s): INR in the last 168 hours.  Urinalysis    Component Value Date/Time   COLORURINE STRAW (A) 12/11/2008 1923   APPEARANCEUR CLEAR 12/11/2008 1923   LABSPEC 1.020 12/11/2008 1923   PHURINE 6.0 12/11/2008 1923   GLUCOSEU NEGATIVE 12/11/2008 1923   HGBUR NEGATIVE 12/11/2008 1923   BILIRUBINUR NEGATIVE 12/11/2008 1923   KETONESUR NEGATIVE 12/11/2008 1923   PROTEINUR NEGATIVE 12/11/2008 1923   UROBILINOGEN 0.2 12/11/2008 1923   NITRITE NEGATIVE 12/11/2008 1923   LEUKOCYTESUR  12/11/2008 1923    NEGATIVE MICROSCOPIC NOT DONE ON URINES WITH NEGATIVE PROTEIN, BLOOD, LEUKOCYTES, NITRITE, OR GLUCOSE <1000 mg/dL.      ELIGIBLE FOR AVAILABLE RESEARCH PROTOCOL: no  STUDIES: No results found.  ASSESSMENT: 40 y.o.  woman status post left breast upper outer quadrant biopsy 11/20/2015 for a clinical T1c N0, stage IA invasive ductal carcinoma, grade 2, estrogen receptor moderately positive, progesterone receptor negative, HER-2 equivocal by both immunohistochemistry and FISH  (1) neoadjuvant tamoxifen started 12/11/2015, stopped at the start of chemotherapy  (2) genetics testing 01/03/2016 through the Breast/Ovarian gene panel offered by [facility] found no deleterious mutations in ATM, BARD1, BRCA1, BRCA2, BRIP1, CDH1, CHEK2, EPCAM, FANCC, MLH1, MSH2, MSH6, NBN, PALB2, PMS2, PTEN, RAD51C, RAD51D, TP53, and XRCC2  (3) neoadjuvant chemotherapy consisting of carboplatin, docetaxel, trastuzumab and pertuzumab every 21 days 6 starting 01/01/2016, completed 04/15/2016  (4) trastuzumab to be continued to complete a year (through June 2018)  (a echocardiogram  03/28/2016) Shows an ejection fraction of 60-65%.   (5) definitive surgery to follow  (6) adjuvant radiation to follow surgery   (7) anti-estrogens to be continued 5-10 years, more likely the latter    PLAN: [name] is completing her chemotherapy today. She has done a terrific job and we have not had any dose reductions or delays despite the multiple problems she encountered.  I am going to omit the pertuzumab from this final cycle because of the diarrhea problems he had last time, although those have been due to a virus and not to the chemotherapy.  She is already scheduled for repeat breast MRI 04/23/2016 and to see Dr. Donne Hazel 2 days later. She is planning lumpectomy but also breast reductions.  She was hoping to have her bilateral salpingo-oophorectomy at the time of surgery. She will discuss that with Dr. Donne Hazel. She will keep her port of course since she has to continue trastuzumab through June of next year  She will see me again 05/27/2016. By that time she will have had her surgery and likely will be starting radiation.  I am setting her up for fluids next week just to make sure we get her through this final chemotherapy cycle without event  She knows to call for any problems that may develop before her next visit here.   Jordan Cruel, MD   04/15/2016 9:15 AM Medical Oncology and Hematology [facility] [address] [place], [place] [address] Tel. [phone]    Fax. [phone]

## 2016-04-15 NOTE — Patient Instructions (Addendum)
[facility] Discharge Instructions for Patients Receiving Chemotherapy  Today you received the following chemotherapy agents:  Herceptin, Taxotere, and Carboplatin.  To help prevent nausea and vomiting after your treatment, we encourage you to take your nausea medication as directed.   If you develop nausea and vomiting that is not controlled by your nausea medication, call the clinic.   BELOW ARE SYMPTOMS THAT SHOULD BE REPORTED IMMEDIATELY:  *FEVER GREATER THAN 100.5 F  *CHILLS WITH OR WITHOUT FEVER  NAUSEA AND VOMITING THAT IS NOT CONTROLLED WITH YOUR NAUSEA MEDICATION  *UNUSUAL SHORTNESS OF BREATH  *UNUSUAL BRUISING OR BLEEDING  TENDERNESS IN MOUTH AND THROAT WITH OR WITHOUT PRESENCE OF ULCERS  *URINARY PROBLEMS  *BOWEL PROBLEMS  UNUSUAL RASH Items with * indicate a potential emergency and should be followed up as soon as possible.  Feel free to call the clinic you have any questions or concerns. The clinic phone number is [phone].  Please show the [facility] at check-in to the Emergency Department and triage nurse.

## 2016-04-16 ENCOUNTER — Other Ambulatory Visit: Payer: Self-pay | Admitting: Oncology

## 2016-04-16 DIAGNOSIS — C50412 Malignant neoplasm of upper-outer quadrant of left female breast: Secondary | ICD-10-CM

## 2016-04-16 DIAGNOSIS — Z170 Estrogen receptor positive status [ER+]: Secondary | ICD-10-CM

## 2016-04-17 ENCOUNTER — Telehealth: Payer: Self-pay | Admitting: *Deleted

## 2016-04-17 NOTE — Telephone Encounter (Addendum)
Patient called in regards to needing a refill on  Lorazepam, pt. Said she was told on her last visit that it would be called in to her pharmacy

## 2016-04-22 ENCOUNTER — Ambulatory Visit (HOSPITAL_BASED_OUTPATIENT_CLINIC_OR_DEPARTMENT_OTHER): Payer: BLUE CROSS/BLUE SHIELD

## 2016-04-22 ENCOUNTER — Other Ambulatory Visit: Payer: Self-pay | Admitting: *Deleted

## 2016-04-22 VITALS — BP 132/91 | HR 93 | Temp 98.0°F | Resp 18

## 2016-04-22 DIAGNOSIS — C50412 Malignant neoplasm of upper-outer quadrant of left female breast: Secondary | ICD-10-CM

## 2016-04-22 DIAGNOSIS — Z171 Estrogen receptor negative status [ER-]: Secondary | ICD-10-CM

## 2016-04-22 DIAGNOSIS — C50219 Malignant neoplasm of upper-inner quadrant of unspecified female breast: Secondary | ICD-10-CM

## 2016-04-22 MED ORDER — ACETAMINOPHEN 325 MG PO TABS
ORAL_TABLET | ORAL | Status: AC
  Filled 2016-04-22: qty 2

## 2016-04-22 MED ORDER — HEPARIN SOD (PORK) LOCK FLUSH 100 UNIT/ML IV SOLN
500.0000 [IU] | Freq: Once | INTRAVENOUS | Status: AC
  Administered 2016-04-22: 500 [IU] via INTRAVENOUS
  Filled 2016-04-22: qty 5

## 2016-04-22 MED ORDER — SODIUM CHLORIDE 0.9 % IV SOLN: Freq: Once | INTRAVENOUS | Status: DC

## 2016-04-22 MED ORDER — ACETAMINOPHEN 325 MG PO TABS
650.0000 mg | ORAL_TABLET | Freq: Once | ORAL | Status: AC
  Administered 2016-04-22: 650 mg via ORAL

## 2016-04-22 MED ORDER — ONDANSETRON HCL 4 MG/2ML IJ SOLN
4.0000 mg | Freq: Once | INTRAMUSCULAR | Status: AC
  Administered 2016-04-22: 4 mg via INTRAVENOUS
  Filled 2016-04-22: qty 2

## 2016-04-22 MED ORDER — SODIUM CHLORIDE 0.9 % IV SOLN
INTRAVENOUS | Status: DC
  Administered 2016-04-22: 10:00:00 via INTRAVENOUS

## 2016-04-22 MED ORDER — SODIUM CHLORIDE 0.9% FLUSH
10.0000 mL | INTRAVENOUS | Status: DC | PRN
  Administered 2016-04-22: 10 mL via INTRAVENOUS
  Filled 2016-04-22: qty 10

## 2016-04-22 NOTE — Patient Instructions (Signed)

## 2016-04-23 ENCOUNTER — Ambulatory Visit (HOSPITAL_BASED_OUTPATIENT_CLINIC_OR_DEPARTMENT_OTHER): Payer: BLUE CROSS/BLUE SHIELD | Admitting: Nurse Practitioner

## 2016-04-23 ENCOUNTER — Telehealth: Payer: Self-pay | Admitting: *Deleted

## 2016-04-23 ENCOUNTER — Encounter: Payer: Self-pay | Admitting: Nurse Practitioner

## 2016-04-23 ENCOUNTER — Ambulatory Visit (HOSPITAL_BASED_OUTPATIENT_CLINIC_OR_DEPARTMENT_OTHER): Payer: BLUE CROSS/BLUE SHIELD

## 2016-04-23 ENCOUNTER — Other Ambulatory Visit: Payer: BLUE CROSS/BLUE SHIELD

## 2016-04-23 VITALS — BP 126/85 | HR 91 | Temp 98.3°F | Resp 17 | Ht 64.0 in | Wt 183.6 lb

## 2016-04-23 DIAGNOSIS — R197 Diarrhea, unspecified: Secondary | ICD-10-CM

## 2016-04-23 DIAGNOSIS — E860 Dehydration: Secondary | ICD-10-CM

## 2016-04-23 DIAGNOSIS — C50412 Malignant neoplasm of upper-outer quadrant of left female breast: Secondary | ICD-10-CM

## 2016-04-23 LAB — COMPREHENSIVE METABOLIC PANEL
ALT: 16 U/L (ref 0–55)
AST: 15 U/L (ref 5–34)
Albumin: 3.5 g/dL (ref 3.5–5.0)
Alkaline Phosphatase: 70 U/L (ref 40–150)
Anion Gap: 7 mEq/L (ref 3–11)
BUN: 4.6 mg/dL — ABNORMAL LOW (ref 7.0–26.0)
CO2: 26 mEq/L (ref 22–29)
Calcium: 9.3 mg/dL (ref 8.4–10.4)
Chloride: 107 mEq/L (ref 98–109)
Creatinine: 0.8 mg/dL (ref 0.6–1.1)
EGFR: >90 mL/min/{1.73_m2} (ref >90)
Glucose: 99 mg/dl (ref 70–140)
Potassium: 3.6 mEq/L (ref 3.5–5.1)
Sodium: 140 mEq/L (ref 136–145)
Total Bilirubin: <0.22 mg/dL (ref 0.20–1.20)
Total Protein: 6.8 g/dL (ref 6.4–8.3)

## 2016-04-23 LAB — CBC WITH DIFFERENTIAL/PLATELET
BASO%: 0.2 % (ref 0.0–2.0)
Basophils Absolute: 0 10*3/uL (ref 0.0–0.1)
EOS%: 0 % (ref 0.0–7.0)
Eosinophils Absolute: 0 10*3/uL (ref 0.0–0.5)
HCT: 29.4 % — ABNORMAL LOW (ref 34.8–46.6)
HGB: 10 g/dL — ABNORMAL LOW (ref 11.6–15.9)
LYMPH%: 40.2 % (ref 14.0–49.7)
MCH: 31.9 pg (ref 25.1–34.0)
MCHC: 34 g/dL (ref 31.5–36.0)
MCV: 93.9 fL (ref 79.5–101.0)
MONO#: 1.6 10*3/uL — ABNORMAL HIGH (ref 0.1–0.9)
MONO%: 25.4 % — ABNORMAL HIGH (ref 0.0–14.0)
NEUT#: 2.1 10*3/uL (ref 1.5–6.5)
NEUT%: 34.2 % — ABNORMAL LOW (ref 38.4–76.8)
Platelets: 204 10*3/uL (ref 145–400)
RBC: 3.13 10*6/uL — ABNORMAL LOW (ref 3.70–5.45)
RDW: 15.1 % — ABNORMAL HIGH (ref 11.2–14.5)
WBC: 6.2 10*3/uL (ref 3.9–10.3)
lymph#: 2.5 10*3/uL (ref 0.9–3.3)

## 2016-04-23 MED ORDER — SODIUM CHLORIDE 0.9 % IV SOLN
Freq: Once | INTRAVENOUS | Status: AC
  Administered 2016-04-23: 15:00:00 via INTRAVENOUS

## 2016-04-23 MED ORDER — HEPARIN SOD (PORK) LOCK FLUSH 100 UNIT/ML IV SOLN
500.0000 [IU] | Freq: Once | INTRAVENOUS | Status: AC
  Administered 2016-04-23: 500 [IU] via INTRAVENOUS
  Filled 2016-04-23: qty 5

## 2016-04-23 MED ORDER — SODIUM CHLORIDE 0.9% FLUSH
10.0000 mL | Freq: Once | INTRAVENOUS | Status: AC
  Administered 2016-04-23: 10 mL via INTRAVENOUS
  Filled 2016-04-23: qty 10

## 2016-04-23 NOTE — Telephone Encounter (Signed)
Pt reports Unrelieved Diarrhea today.  States "everything I eat runs right through me."   She says she has taken 2 Lomotil and 2 Imodium AD today w/o relief.  She is concerned about Dehydration.  She says she is also going to have to cancel her MRI this afternoon because she doesn't want to have diarrhea while in MRI.

## 2016-04-23 NOTE — Telephone Encounter (Signed)
Instructed pt to come in as soon as she can for Lab and [facility].   She will try to be here by 2 pm.   Pt says she will not be able to make her MRI today and asks nurse to cancel.   Called GI and canceled MRI and pt will call them to reschedule as soon as she can.

## 2016-04-23 NOTE — Assessment & Plan Note (Signed)
Patient has been suffering with chronic constipation after each cycle of her chemotherapy in the past; so when she received her final cycle 6 chemotherapy.  The Perjeta was held.  The hope was that the Perjeta was causing the diarrhea; but patient unfortunately continued to experience diarrhea after receiving cycle 6 of her chemotherapy on 04/15/2016.  She has been alternating both Lomotil and Imodium 2 tablets with each loose stool.  She's been taken approximately 4 of the Imodium and 4 of the Lomotil tablets total per 24 hour period.  She has tried eating a bland diet.  She feels tired and dehydrated today.  She is also noted to have been taken doxycycline in the past for some mucositis issues.  She states that she did not relapse it was antibiotics; and that all of her mucositis has since resolved.  Labs obtained today do appear fairly within normal limits.  Patient does appear mildly dehydrated; although vital signs are stable.  Patient will receive IV fluid rehydration while the cancer Center today.  Also, since patient did recently have antibiotics-have ordered a C. difficile stool test.  Patient was unable to give a stool test sample today; so will take on the stool kit.  She was encouraged to bring the test back as soon as she is able.  Advised patient to hold any further doxycycline sent it does not appear to be needed anymore.  Patient was advised to back off to a clear liquid diet only until she is better able to tolerate a bland diet.  Also, patient may need to have additional IV fluid rehydration in the future; was encouraged to call back if she feels this is needed.

## 2016-04-23 NOTE — Progress Notes (Signed)
SYMPTOM MANAGEMENT CLINIC    Chief Complaint: Diarrhea, dehydration  HPI:  [name] 40 y.o. female diagnosed with breast cancer.  Recently completed Taxotere/Paraplatin/Herceptin/Perjeta chemotherapy regimen.    No history exists.    Review of Systems  Constitutional: Positive for malaise/fatigue.  Gastrointestinal: Positive for diarrhea.  All other systems reviewed and are negative.   Past Medical History:  Diagnosis Date  . Allergy   . Anxiety   . Asthma    childhood   . Breast cancer ([place]) 11/20/15   left breast  . Depression   . Endometriosis   . Family history of breast cancer   . GERD (gastroesophageal reflux disease)   . Herniated disc, cervical   . History of bronchitis as a child   . History of stomach ulcers   . History of urinary tract infection   . Hormone disorder   . IBD (inflammatory bowel disease)   . Migraine without aura   . Neck pain   . PONV (postoperative nausea and vomiting)   . Shortness of breath dyspnea    anxiety     Past Surgical History:  Procedure Laterality Date  . laparoscopically assisted vaginal hysterectomy  01/2006   Still has ovaries  . LAPAROSCOPY     x 2, diagnosed with endometriosis (prior to hysterectomy), only treated medically.  Marland Kitchen PORTACATH PLACEMENT Right 12/25/2015   Procedure: INSERTION PORT-A-CATH WITH ULTRA SOUND;  Surgeon: [name], MD;  Location: [facility];  Service: General;  Laterality: Right;    has Breast cancer of upper-outer quadrant of left female breast ([place]); Family history of breast cancer; Genetic testing; Diarrhea; and Dehydration on her problem list.    is allergic to amoxicillin and diflucan [fluconazole].    Medication List       Accurate as of 04/23/16  5:40 PM. Always use your most recent med list.          diphenoxylate-atropine 2.5-0.025 MG tablet Commonly known as:  LOMOTIL Take 2 tablets by mouth 4 (four) times daily as needed for diarrhea or loose stools.     lidocaine-prilocaine cream Commonly known as:  EMLA Apply to affected area once   LORazepam 0.5 MG tablet Commonly known as:  ATIVAN TAKE 1 TABLET AT BEDTIME AS NEEDED FOR NAUSEA AND VOMITING   ondansetron 8 MG tablet Commonly known as:  ZOFRAN Take 1 tablet (8 mg total) by mouth every 8 (eight) hours as needed for nausea or vomiting.   prochlorperazine 10 MG tablet Commonly known as:  COMPAZINE Take 1 tablet (10 mg total) by mouth every 6 (six) hours as needed (Nausea or vomiting).   sertraline 100 MG tablet Commonly known as:  ZOLOFT Take 1 tablet (100 mg total) by mouth daily.   traMADol 50 MG tablet Commonly known as:  ULTRAM TAKE 1 TABLET EVERY 6 HOURS   valACYclovir 500 MG tablet Commonly known as:  VALTREX Take 1 tablet (500 mg total) by mouth daily.   zolpidem 10 MG tablet Commonly known as:  AMBIEN Take 1/2 to 1 tab po qhs prn        PHYSICAL EXAMINATION  Oncology Vitals 04/23/2016 04/22/2016  Height 163 cm -  Weight 83.28 kg -  Weight (lbs) 183 lbs 10 oz -  BMI (kg/m2) 31.51 kg/m2 -  Temp 98.3 -  Pulse 91 93  Resp 17 18  SpO2 100 100  BSA (m2) 1.94 m2 -   BP Readings from Last 2 Encounters:  04/23/16 126/85  04/22/16 Marland Kitchen)  132/91    Physical Exam  Constitutional: She is oriented to person, place, and time and well-developed, well-nourished, and in no distress.  HENT:  Head: Normocephalic and atraumatic.  Eyes: Conjunctivae and EOM are normal. Pupils are equal, round, and reactive to light.  Neck: Normal range of motion.  Pulmonary/Chest: Effort normal. No respiratory distress.  Musculoskeletal: Normal range of motion.  Neurological: She is alert and oriented to person, place, and time. Gait normal.  Skin: Skin is warm and dry.  Psychiatric: Affect normal.  Nursing note and vitals reviewed.   LABORATORY DATA:. Appointment on 04/23/2016  Component Date Value Ref Range Status  . WBC 04/23/2016 6.2  3.9 - 10.3 10e3/uL Final  . NEUT#  04/23/2016 2.1  1.5 - 6.5 10e3/uL Final  . HGB 04/23/2016 10.0* 11.6 - 15.9 g/dL Final  . HCT 04/23/2016 29.4* 34.8 - 46.6 % Final  . Platelets 04/23/2016 204  145 - 400 10e3/uL Final  . MCV 04/23/2016 93.9  79.5 - 101.0 fL Final  . MCH 04/23/2016 31.9  25.1 - 34.0 pg Final  . MCHC 04/23/2016 34.0  31.5 - 36.0 g/dL Final  . RBC 04/23/2016 3.13* 3.70 - 5.45 10e6/uL Final  . RDW 04/23/2016 15.1* 11.2 - 14.5 % Final  . lymph# 04/23/2016 2.5  0.9 - 3.3 10e3/uL Final  . MONO# 04/23/2016 1.6* 0.1 - 0.9 10e3/uL Final  . Eosinophils Absolute 04/23/2016 0.0  0.0 - 0.5 10e3/uL Final  . Basophils Absolute 04/23/2016 0.0  0.0 - 0.1 10e3/uL Final  . NEUT% 04/23/2016 34.2* 38.4 - 76.8 % Final  . LYMPH% 04/23/2016 40.2  14.0 - 49.7 % Final  . MONO% 04/23/2016 25.4* 0.0 - 14.0 % Final  . EOS% 04/23/2016 0.0  0.0 - 7.0 % Final  . BASO% 04/23/2016 0.2  0.0 - 2.0 % Final  . Sodium 04/23/2016 140  136 - 145 mEq/L Final  . Potassium 04/23/2016 3.6  3.5 - 5.1 mEq/L Final  . Chloride 04/23/2016 107  98 - 109 mEq/L Final  . CO2 04/23/2016 26  22 - 29 mEq/L Final  . Glucose 04/23/2016 99  70 - 140 mg/dl Final  . BUN 04/23/2016 4.6* 7.0 - 26.0 mg/dL Final  . Creatinine 04/23/2016 0.8  0.6 - 1.1 mg/dL Final  . Total Bilirubin 04/23/2016 <0.22  0.20 - 1.20 mg/dL Final  . Alkaline Phosphatase 04/23/2016 70  40 - 150 U/L Final  . AST 04/23/2016 15  5 - 34 U/L Final  . ALT 04/23/2016 16  0 - 55 U/L Final  . Total Protein 04/23/2016 6.8  6.4 - 8.3 g/dL Final  . Albumin 04/23/2016 3.5  3.5 - 5.0 g/dL Final  . Calcium 04/23/2016 9.3  8.4 - 10.4 mg/dL Final  . Anion Gap 04/23/2016 7  3 - 11 mEq/L Final  . EGFR 04/23/2016 >90  >90 ml/min/1.73 m2 Final    RADIOGRAPHIC STUDIES: No results found.  ASSESSMENT/PLAN:    Diarrhea Patient has been suffering with chronic constipation after each cycle of her chemotherapy in the past; so when she received her final cycle 6 chemotherapy.  The Perjeta was held.  The hope  was that the Perjeta was causing the diarrhea; but patient unfortunately continued to experience diarrhea after receiving cycle 6 of her chemotherapy on 04/15/2016.  She has been alternating both Lomotil and Imodium 2 tablets with each loose stool.  She's been taken approximately 4 of the Imodium and 4 of the Lomotil tablets total per 24 hour period.  She has tried eating a bland diet.  She feels tired and dehydrated today.  She is also noted to have been taken doxycycline in the past for some mucositis issues.  She states that she did not relapse it was antibiotics; and that all of her mucositis has since resolved.  Labs obtained today do appear fairly within normal limits.  Patient does appear mildly dehydrated; although vital signs are stable.  Patient will receive IV fluid rehydration while the cancer Center today.  Also, since patient did recently have antibiotics-have ordered a C. difficile stool test.  Patient was unable to give a stool test sample today; so will take on the stool kit.  She was encouraged to bring the test back as soon as she is able.  Advised patient to hold any further doxycycline sent it does not appear to be needed anymore.  Patient was advised to back off to a clear liquid diet only until she is better able to tolerate a bland diet.  Also, patient may need to have additional IV fluid rehydration in the future; was encouraged to call back if she feels this is needed.  Dehydration Patient has been suffering with chronic constipation after each cycle of her chemotherapy in the past; so when she received her final cycle 6 chemotherapy.  The Perjeta was held.  The hope was that the Perjeta was causing the diarrhea; but patient unfortunately continued to experience diarrhea after receiving cycle 6 of her chemotherapy on 04/15/2016.  She has been alternating both Lomotil and Imodium 2 tablets with each loose stool.  She's been taken approximately 4 of the Imodium and 4 of the  Lomotil tablets total per 24 hour period.  She has tried eating a bland diet.  She feels tired and dehydrated today.  She is also noted to have been taken doxycycline in the past for some mucositis issues.  She states that she did not relapse it was antibiotics; and that all of her mucositis has since resolved.  Labs obtained today do appear fairly within normal limits.  Patient does appear mildly dehydrated; although vital signs are stable.  Patient will receive IV fluid rehydration while the cancer Center today.  Also, since patient did recently have antibiotics-have ordered a C. difficile stool test.  Patient was unable to give a stool test sample today; so will take on the stool kit.  She was encouraged to bring the test back as soon as she is able.  Advised patient to hold any further doxycycline sent it does not appear to be needed anymore.  Patient was advised to back off to a clear liquid diet only until she is better able to tolerate a bland diet.  Also, patient may need to have additional IV fluid rehydration in the future; was encouraged to call back if she feels this is needed.  Breast cancer of upper-outer quadrant of left female breast [facility]) Patient received cycle 6 of her Taxotere/carboplatin/Herceptin chemotherapy regimen on 04/15/2016; which was considered.  Her last cycle of chemotherapy.  She also received Neulasta for growth factor support.  The Perjeta portion of cycle 6 was held; since it was a possible trigger for patient's consistent diarrhea issues.  Patient was scheduled for a restaging breast MRI today; and was also scheduled for a surgical follow-up with Dr. Donne Hazel in 2 days- but patient canceled the MRI; since she has such significant issues with diarrhea.  Patient was advised she will need to call immediately to reschedule the breast MRI  and therefore push back Dr. Cristal Generous appointment as well.  She stated that she would call and make both appointments again as soon  as possible.  Patient is scheduled to return on May 06, 2016 for labs and her next cycle of Herceptin infusion.   Patient stated understanding of all instructions; and was in agreement with this plan of care. The patient knows to call the clinic with any problems, questions or concerns.   Total time spent with patient was 25 minutes;  with greater than 75 percent of that time spent in face to face counseling regarding patient's symptoms,  and coordination of care and follow up.  Disclaimer:This dictation was prepared with Dragon/digital dictation along with [employer]. Any transcriptional errors that result from this process are unintentional.  Drue Second, NP 04/23/2016

## 2016-04-23 NOTE — Patient Instructions (Signed)
Dehydration, Adult Dehydration is a condition in which you do not have enough fluid or water in your body. It happens when you take in less fluid than you lose. Vital organs such as the kidneys, brain, and heart cannot function without a proper amount of fluids. Any loss of fluids from the body can cause dehydration.  Dehydration can range from mild to severe. This condition should be treated right away to help prevent it from becoming severe. CAUSES  This condition may be caused by:  Vomiting.  Diarrhea.  Excessive sweating, such as when exercising in hot or humid weather.  Not drinking enough fluid during strenuous exercise or during an illness.  Excessive urine output.  Fever.  Certain medicines. RISK FACTORS This condition is more likely to develop in:  People who are taking certain medicines that cause the body to lose excess fluid (diuretics).   People who have a chronic illness, such as diabetes, that may increase urination.  Older adults.   People who live at high altitudes.   People who participate in endurance sports.  SYMPTOMS  Mild Dehydration  Thirst.  Dry lips.  Slightly dry mouth.  Dry, warm skin. Moderate Dehydration  Very dry mouth.   Muscle cramps.   Dark urine and decreased urine production.   Decreased tear production.   Headache.   Light-headedness, especially when you stand up from a sitting position.  Severe Dehydration  Changes in skin.   Cold and clammy skin.   Skin does not spring back quickly when lightly pinched and released.   Changes in body fluids.   Extreme thirst.   No tears.   Not able to sweat when body temperature is high, such as in hot weather.   Minimal urine production.   Changes in vital signs.   Rapid, weak pulse (more than 100 beats per minute when you are sitting still).   Rapid breathing.   Low blood pressure.   Other changes.   Sunken eyes.   Cold hands and feet.    Confusion.  Lethargy and difficulty being awakened.  Fainting (syncope).   Short-term weight loss.   Unconsciousness. DIAGNOSIS  This condition may be diagnosed based on your symptoms. You may also have tests to determine how severe your dehydration is. These tests may include:   Urine tests.   Blood tests.  TREATMENT  Treatment for this condition depends on the severity. Mild or moderate dehydration can often be treated at home. Treatment should be started right away. Do not wait until dehydration becomes severe. Severe dehydration needs to be treated at the hospital. Treatment for Mild Dehydration  Drinking plenty of water to replace the fluid you have lost.   Replacing minerals in your blood (electrolytes) that you may have lost.  Treatment for Moderate Dehydration  Consuming oral rehydration solution (ORS). Treatment for Severe Dehydration  Receiving fluid through an IV tube.   Receiving electrolyte solution through a feeding tube that is passed through your nose and into your stomach (nasogastric tube or NG tube).  Correcting any abnormalities in electrolytes. HOME CARE INSTRUCTIONS   Drink enough fluid to keep your urine clear or pale yellow.   Drink water or fluid slowly by taking small sips. You can also try sucking on ice cubes.  Have food or beverages that contain electrolytes. Examples include bananas and sports drinks.  Take over-the-counter and prescription medicines only as told by your health care provider.   Prepare ORS according to the manufacturer's instructions. Take sips  of ORS every 5 minutes until your urine returns to normal.  If you have vomiting or diarrhea, continue to try to drink water, ORS, or both.   If you have diarrhea, avoid:   Beverages that contain caffeine.   Fruit juice.   Milk.   Carbonated soft drinks.  Do not take salt tablets. This can lead to the condition of having too much sodium in your body  (hypernatremia).  SEEK MEDICAL CARE IF:  You cannot eat or drink without vomiting.  You have had moderate diarrhea during a period of more than 24 hours.  You have a fever. SEEK IMMEDIATE MEDICAL CARE IF:   You have extreme thirst.  You have severe diarrhea.  You have not urinated in 6-8 hours, or you have urinated only a small amount of very dark urine.  You have shriveled skin.  You are dizzy, confused, or both.   This information is not intended to replace advice given to you by your health care provider. Make sure you discuss any questions you have with your health care provider.   Document Released: 06/23/2005 Document Revised: 03/14/2015 Document Reviewed: 11/08/2014 Elsevier Interactive Patient Education 2016 [place] Choices to Help Relieve Diarrhea, Adult When you have diarrhea, the foods you eat and your eating habits are very important. Choosing the right foods and drinks can help relieve diarrhea. Also, because diarrhea can last up to 7 days, you need to replace lost fluids and electrolytes (such as sodium, potassium, and chloride) in order to help prevent dehydration.  WHAT GENERAL GUIDELINES DO I NEED TO FOLLOW?  Slowly drink 1 cup (8 oz) of fluid for each episode of diarrhea. If you are getting enough fluid, your urine will be clear or pale yellow.  Eat starchy foods. Some good choices include white rice, white toast, pasta, low-fiber cereal, baked potatoes (without the skin), saltine crackers, and bagels.  Avoid large servings of any cooked vegetables.  Limit fruit to two servings per day. A serving is  cup or 1 small piece.  Choose foods with less than 2 g of fiber per serving.  Limit fats to less than 8 tsp (38 g) per day.  Avoid fried foods.  Eat foods that have probiotics in them. Probiotics can be found in certain dairy products.  Avoid foods and beverages that may increase the speed at which food moves through the stomach and  intestines (gastrointestinal tract). Things to avoid include:  High-fiber foods, such as dried fruit, raw fruits and vegetables, nuts, seeds, and whole grain foods.  Spicy foods and high-fat foods.  Foods and beverages sweetened with high-fructose corn syrup, honey, or sugar alcohols such as xylitol, sorbitol, and mannitol. WHAT FOODS ARE RECOMMENDED? Grains White rice. White, [place], or pita breads (fresh or toasted), including plain rolls, buns, or bagels. White pasta. Saltine, soda, or graham crackers. Pretzels. Low-fiber cereal. Cooked cereals made with water (such as cornmeal, farina, or cream cereals). Plain muffins. Matzo. Melba toast. Zwieback.  Vegetables Potatoes (without the skin). Strained tomato and vegetable juices. Most well-cooked and canned vegetables without seeds. Tender lettuce. Fruits Cooked or canned applesauce, apricots, cherries, fruit cocktail, grapefruit, peaches, pears, or plums. Fresh bananas, apples without skin, cherries, grapes, cantaloupe, grapefruit, peaches, oranges, or plums.  Meat and Other Protein Products Baked or boiled chicken. Eggs. Tofu. Fish. Seafood. Smooth peanut butter. Ground or well-cooked tender beef, ham, veal, lamb, pork, or poultry.  Dairy Plain yogurt, kefir, and unsweetened liquid yogurt. Lactose-free milk,  buttermilk, or soy milk. Plain hard cheese. Beverages Sport drinks. Clear broths. Diluted fruit juices (except prune). Regular, caffeine-free sodas such as ginger ale. Water. Decaffeinated teas. Oral rehydration solutions. Sugar-free beverages not sweetened with sugar alcohols. Other Bouillon, broth, or soups made from recommended foods.  The items listed above may not be a complete list of recommended foods or beverages. Contact your dietitian for more options. WHAT FOODS ARE NOT RECOMMENDED? Grains Whole grain, whole wheat, bran, or rye breads, rolls, pastas, crackers, and cereals. Wild or brown rice. Cereals that contain more than 2  g of fiber per serving. Corn tortillas or taco shells. Cooked or dry oatmeal. Granola. Popcorn. Vegetables Raw vegetables. Cabbage, broccoli, Brussels sprouts, artichokes, baked beans, beet greens, corn, kale, legumes, peas, sweet potatoes, and yams. Potato skins. Cooked spinach and cabbage. Fruits Dried fruit, including raisins and dates. Raw fruits. Stewed or dried prunes. Fresh apples with skin, apricots, mangoes, pears, raspberries, and strawberries.  Meat and Other Protein Products Chunky peanut butter. Nuts and seeds. Beans and lentils. Berniece Salines.  Dairy High-fat cheeses. Milk, chocolate milk, and beverages made with milk, such as milk shakes. Cream. Ice cream. Sweets and Desserts Sweet rolls, doughnuts, and sweet breads. Pancakes and waffles. Fats and Oils Butter. Cream sauces. Margarine. Salad oils. Plain salad dressings. Olives. Avocados.  Beverages Caffeinated beverages (such as coffee, tea, soda, or energy drinks). Alcoholic beverages. Fruit juices with pulp. Prune juice. Soft drinks sweetened with high-fructose corn syrup or sugar alcohols. Other Coconut. Hot sauce. Chili powder. Mayonnaise. Gravy. Cream-based or milk-based soups.  The items listed above may not be a complete list of foods and beverages to avoid. Contact your dietitian for more information. WHAT SHOULD I DO IF I BECOME DEHYDRATED? Diarrhea can sometimes lead to dehydration. Signs of dehydration include dark urine and dry mouth and skin. If you think you are dehydrated, you should rehydrate with an oral rehydration solution. These solutions can be purchased at pharmacies, retail stores, or online.  Drink -1 cup (120-240 mL) of oral rehydration solution each time you have an episode of diarrhea. If drinking this amount makes your diarrhea worse, try drinking smaller amounts more often. For example, drink 1-3 tsp (5-15 mL) every 5-10 minutes.  A general rule for staying hydrated is to drink 1-2 L of fluid per day. Talk to  your health care provider about the specific amount you should be drinking each day. Drink enough fluids to keep your urine clear or pale yellow.   This information is not intended to replace advice given to you by your health care provider. Make sure you discuss any questions you have with your health care provider.   Document Released: 09/13/2003 Document Revised: 07/14/2014 Document Reviewed: 05/16/2013 Elsevier Interactive Patient Education 2016 [place].  Diarrhea Diarrhea is frequent loose and watery bowel movements. It can cause you to feel weak and dehydrated. Dehydration can cause you to become tired and thirsty, have a dry mouth, and have decreased urination that often is dark yellow. Diarrhea is a sign of another problem, most often an infection that will not last long. In most cases, diarrhea typically lasts 2-3 days. However, it can last longer if it is a sign of something more serious. It is important to treat your diarrhea as directed by your caregiver to lessen or prevent future episodes of diarrhea. CAUSES  Some common causes include:  Gastrointestinal infections caused by viruses, bacteria, or parasites.  Food poisoning or food allergies.  Certain medicines, such as antibiotics,  chemotherapy, and laxatives.  Artificial sweeteners and fructose.  Digestive disorders. HOME CARE INSTRUCTIONS  Ensure adequate fluid intake (hydration): Have 1 cup (8 oz) of fluid for each diarrhea episode. Avoid fluids that contain simple sugars or sports drinks, fruit juices, whole milk products, and sodas. Your urine should be clear or pale yellow if you are drinking enough fluids. Hydrate with an oral rehydration solution that you can purchase at pharmacies, retail stores, and online. You can prepare an oral rehydration solution at home by mixing the following ingredients together:   - tsp table salt.   tsp baking soda.   tsp salt substitute containing potassium chloride.  1   tablespoons sugar.  1 L (34 oz) of water.  Certain foods and beverages may increase the speed at which food moves through the gastrointestinal (GI) tract. These foods and beverages should be avoided and include:  Caffeinated and alcoholic beverages.  High-fiber foods, such as raw fruits and vegetables, nuts, seeds, and whole grain breads and cereals.  Foods and beverages sweetened with sugar alcohols, such as xylitol, sorbitol, and mannitol.  Some foods may be well tolerated and may help thicken stool including:  Starchy foods, such as rice, toast, pasta, low-sugar cereal, oatmeal, grits, baked potatoes, crackers, and bagels.  Bananas.  Applesauce.  Add probiotic-rich foods to help increase healthy bacteria in the GI tract, such as yogurt and fermented milk products.  Wash your hands well after each diarrhea episode.  Only take over-the-counter or prescription medicines as directed by your caregiver.  Take a warm bath to relieve any burning or pain from frequent diarrhea episodes. SEEK IMMEDIATE MEDICAL CARE IF:   You are unable to keep fluids down.  You have persistent vomiting.  You have blood in your stool, or your stools are black and tarry.  You do not urinate in 6-8 hours, or there is only a small amount of very dark urine.  You have abdominal pain that increases or localizes.  You have weakness, dizziness, confusion, or light-headedness.  You have a severe headache.  Your diarrhea gets worse or does not get better.  You have a fever or persistent symptoms for more than 2-3 days.  You have a fever and your symptoms suddenly get worse. MAKE SURE YOU:   Understand these instructions.  Will watch your condition.  Will get help right away if you are not doing well or get worse.   This information is not intended to replace advice given to you by your health care provider. Make sure you discuss any questions you have with your health care provider.     Document Released: 06/13/2002 Document Revised: 07/14/2014 Document Reviewed: 02/29/2012 Elsevier Interactive Patient Education [employer].

## 2016-04-23 NOTE — Assessment & Plan Note (Addendum)
Patient received cycle 6 of her Taxotere/carboplatin/Herceptin chemotherapy regimen on 04/15/2016; which was considered.  Her last cycle of chemotherapy.  She also received Neulasta for growth factor support.  The [place] portion of cycle 6 was held; since it was a possible trigger for patient's consistent diarrhea issues.  Patient was scheduled for a restaging breast MRI today; and was also scheduled for a surgical follow-up with Dr. Donne Hazel in 2 days- but patient canceled the MRI; since she has such significant issues with diarrhea.  Patient was advised she will need to call immediately to reschedule the breast MRI and therefore push back Dr. Cristal Generous appointment as well.  She stated that she would call and make both appointments again as soon as possible.  Patient is scheduled to return on May 06, 2016 for labs and her next cycle of Herceptin infusion.

## 2016-04-25 ENCOUNTER — Other Ambulatory Visit: Payer: BLUE CROSS/BLUE SHIELD

## 2016-04-26 ENCOUNTER — Other Ambulatory Visit: Payer: Self-pay | Admitting: Oncology

## 2016-04-28 ENCOUNTER — Telehealth: Payer: Self-pay | Admitting: *Deleted

## 2016-04-28 DIAGNOSIS — C50219 Malignant neoplasm of upper-inner quadrant of unspecified female breast: Secondary | ICD-10-CM

## 2016-04-28 DIAGNOSIS — Z171 Estrogen receptor negative status [ER-]: Secondary | ICD-10-CM

## 2016-04-28 MED ORDER — ZOLPIDEM TARTRATE 10 MG PO TABS: ORAL_TABLET | ORAL | 0 refills | Status: DC

## 2016-04-28 NOTE — Telephone Encounter (Signed)
TC from patient requesting refill for her Ambien. This was refilled via phone refill order.

## 2016-04-29 ENCOUNTER — Other Ambulatory Visit: Payer: Self-pay | Admitting: Oncology

## 2016-05-01 ENCOUNTER — Ambulatory Visit
Admission: RE | Admit: 2016-05-01 | Discharge: 2016-05-01 | Disposition: A | Discharge status: Home / Self Care | Payer: BLUE CROSS/BLUE SHIELD | Source: Ambulatory Visit | Attending: Oncology | Admitting: Oncology

## 2016-05-01 DIAGNOSIS — C50412 Malignant neoplasm of upper-outer quadrant of left female breast: Secondary | ICD-10-CM

## 2016-05-01 MED ORDER — GADOBENATE DIMEGLUMINE 529 MG/ML IV SOLN
18.0000 mL | Freq: Once | INTRAVENOUS | Status: AC | PRN
Start: 2016-05-01 — End: 2016-05-01
  Administered 2016-05-01: 18 mL via INTRAVENOUS

## 2016-05-02 ENCOUNTER — Other Ambulatory Visit: Payer: Self-pay | Admitting: General Surgery

## 2016-05-02 DIAGNOSIS — C50412 Malignant neoplasm of upper-outer quadrant of left female breast: Secondary | ICD-10-CM

## 2016-05-02 DIAGNOSIS — Z170 Estrogen receptor positive status [ER+]: Secondary | ICD-10-CM

## 2016-05-06 ENCOUNTER — Ambulatory Visit (HOSPITAL_BASED_OUTPATIENT_CLINIC_OR_DEPARTMENT_OTHER): Payer: BLUE CROSS/BLUE SHIELD

## 2016-05-06 ENCOUNTER — Other Ambulatory Visit (HOSPITAL_BASED_OUTPATIENT_CLINIC_OR_DEPARTMENT_OTHER): Payer: BLUE CROSS/BLUE SHIELD

## 2016-05-06 ENCOUNTER — Other Ambulatory Visit: Payer: Self-pay | Admitting: Oncology

## 2016-05-06 ENCOUNTER — Telehealth: Payer: Self-pay | Admitting: Obstetrics and Gynecology

## 2016-05-06 VITALS — BP 121/76 | HR 90 | Temp 98.4°F | Resp 17

## 2016-05-06 DIAGNOSIS — E860 Dehydration: Secondary | ICD-10-CM

## 2016-05-06 DIAGNOSIS — Z5112 Encounter for antineoplastic immunotherapy: Secondary | ICD-10-CM

## 2016-05-06 DIAGNOSIS — C50412 Malignant neoplasm of upper-outer quadrant of left female breast: Secondary | ICD-10-CM | POA: Diagnosis not present

## 2016-05-06 LAB — CBC WITH DIFFERENTIAL/PLATELET
BASO%: 0.4 % (ref 0.0–2.0)
Basophils Absolute: 0 10*3/uL (ref 0.0–0.1)
EOS%: 0.4 % (ref 0.0–7.0)
Eosinophils Absolute: 0 10*3/uL (ref 0.0–0.5)
HCT: 30 % — ABNORMAL LOW (ref 34.8–46.6)
HGB: 10.1 g/dL — ABNORMAL LOW (ref 11.6–15.9)
LYMPH%: 27.9 % (ref 14.0–49.7)
MCH: 33 pg (ref 25.1–34.0)
MCHC: 33.7 g/dL (ref 31.5–36.0)
MCV: 98 fL (ref 79.5–101.0)
MONO#: 0.7 10*3/uL (ref 0.1–0.9)
MONO%: 12.6 % (ref 0.0–14.0)
NEUT#: 3.1 10*3/uL (ref 1.5–6.5)
NEUT%: 58.7 % (ref 38.4–76.8)
Platelets: 185 10*3/uL (ref 145–400)
RBC: 3.06 10*6/uL — ABNORMAL LOW (ref 3.70–5.45)
RDW: 16.3 % — ABNORMAL HIGH (ref 11.2–14.5)
WBC: 5.3 10*3/uL (ref 3.9–10.3)
lymph#: 1.5 10*3/uL (ref 0.9–3.3)

## 2016-05-06 LAB — COMPREHENSIVE METABOLIC PANEL
ALT: 19 U/L (ref 0–55)
AST: 19 U/L (ref 5–34)
Albumin: 3.4 g/dL — ABNORMAL LOW (ref 3.5–5.0)
Alkaline Phosphatase: 54 U/L (ref 40–150)
Anion Gap: 9 mEq/L (ref 3–11)
BUN: 15.7 mg/dL (ref 7.0–26.0)
CO2: 25 mEq/L (ref 22–29)
Calcium: 9.1 mg/dL (ref 8.4–10.4)
Chloride: 108 mEq/L (ref 98–109)
Creatinine: 0.9 mg/dL (ref 0.6–1.1)
EGFR: >90 mL/min/{1.73_m2} (ref >90)
Glucose: 124 mg/dl (ref 70–140)
Potassium: 3.4 mEq/L — ABNORMAL LOW (ref 3.5–5.1)
Sodium: 143 mEq/L (ref 136–145)
Total Bilirubin: 0.26 mg/dL (ref 0.20–1.20)
Total Protein: 6.4 g/dL (ref 6.4–8.3)

## 2016-05-06 MED ORDER — ACETAMINOPHEN 325 MG PO TABS
ORAL_TABLET | ORAL | Status: AC
  Filled 2016-05-06: qty 2

## 2016-05-06 MED ORDER — DIPHENHYDRAMINE HCL 25 MG PO CAPS
ORAL_CAPSULE | ORAL | Status: AC
  Filled 2016-05-06: qty 2

## 2016-05-06 MED ORDER — HEPARIN SOD (PORK) LOCK FLUSH 100 UNIT/ML IV SOLN
500.0000 [IU] | Freq: Once | INTRAVENOUS | Status: AC | PRN
  Administered 2016-05-06: 500 [IU]
  Filled 2016-05-06: qty 5

## 2016-05-06 MED ORDER — SODIUM CHLORIDE 0.9% FLUSH
10.0000 mL | INTRAVENOUS | Status: DC | PRN
  Administered 2016-05-06: 10 mL
  Filled 2016-05-06: qty 10

## 2016-05-06 MED ORDER — ACETAMINOPHEN 325 MG PO TABS
650.0000 mg | ORAL_TABLET | Freq: Once | ORAL | Status: AC
  Administered 2016-05-06: 650 mg via ORAL

## 2016-05-06 MED ORDER — DIPHENHYDRAMINE HCL 25 MG PO CAPS
25.0000 mg | ORAL_CAPSULE | Freq: Once | ORAL | Status: AC
  Administered 2016-05-06: 25 mg via ORAL

## 2016-05-06 MED ORDER — TRASTUZUMAB CHEMO 150 MG IV SOLR
6.0000 mg/kg | Freq: Once | INTRAVENOUS | Status: AC
  Administered 2016-05-06: 504 mg via INTRAVENOUS
  Filled 2016-05-06: qty 24

## 2016-05-06 MED ORDER — SODIUM CHLORIDE 0.9 % IV SOLN
Freq: Once | INTRAVENOUS | Status: AC
  Administered 2016-05-06: 10:00:00 via INTRAVENOUS

## 2016-05-06 NOTE — Patient Instructions (Signed)
[name]  Today you received the following [name] agents:  Herceptin  To help prevent nausea and vomiting after your treatment, we encourage you to take your nausea medication as directed.   If you develop nausea and vomiting that is not controlled by your nausea medication, call the clinic.   BELOW ARE SYMPTOMS THAT SHOULD BE REPORTED IMMEDIATELY:  *FEVER GREATER THAN 100.5 F  *CHILLS WITH OR WITHOUT FEVER  NAUSEA AND VOMITING THAT IS NOT CONTROLLED WITH YOUR NAUSEA MEDICATION  *UNUSUAL SHORTNESS OF BREATH  *UNUSUAL BRUISING OR BLEEDING  TENDERNESS IN MOUTH AND THROAT WITH OR WITHOUT PRESENCE OF ULCERS  *URINARY PROBLEMS  *BOWEL PROBLEMS  UNUSUAL RASH Items with * indicate a potential emergency and should be followed up as soon as possible.  Feel free to call the clinic you have any questions or concerns. The clinic phone number is [phone].  Please show the [place] at check-in to the Emergency Department and triage nurse.  Trastuzumab injection for infusion What is this medicine? TRASTUZUMAB (tras TOO zoo mab) is a monoclonal antibody. It is used to treat breast cancer and stomach cancer. This medicine may be used for other purposes; ask your health care provider or pharmacist if you have questions. What should I tell my health care provider before I take this medicine? They need to know if you have any of these conditions: -heart disease -heart failure -infection (especially a virus infection such as chickenpox, cold sores, or herpes) -lung or breathing disease, like asthma -recent or ongoing radiation therapy -an unusual or allergic reaction to trastuzumab, benzyl alcohol, or other medications, foods, dyes, or preservatives -pregnant or trying to get pregnant -breast-feeding How should I use this medicine? This drug is given as an infusion into a vein. It is  administered in a hospital or clinic by a specially trained health care professional. Talk to your pediatrician regarding the use of this medicine in children. This medicine is not approved for use in children. Overdosage: If you think you have taken too much of this medicine contact a poison control center or emergency room at once. NOTE: This medicine is only for you. Do not share this medicine with others. What if I miss a dose? It is important not to miss a dose. Call your doctor or health care professional if you are unable to keep an appointment. What may interact with this medicine? -doxorubicin -warfarin This list may not describe all possible interactions. Give your health care provider a list of all the medicines, herbs, non-prescription drugs, or dietary supplements you use. Also tell them if you smoke, drink alcohol, or use illegal drugs. Some items may interact with your medicine. What should I watch for while using this medicine? Visit your doctor for checks on your progress. Report any side effects. Continue your course of treatment even though you feel ill unless your doctor tells you to stop. Call your doctor or health care professional for advice if you get a fever, chills or sore throat, or other symptoms of a cold or flu. Do not treat yourself. Try to avoid being around people who are sick. You may experience fever, chills and shaking during your first infusion. These effects are usually mild and can be treated with other medicines. Report any side effects during the infusion to your health care professional. Fever and chills usually do not happen with later infusions. Do not become pregnant while taking this medicine or  for 7 months after stopping it. Women should inform their doctor if they wish to become pregnant or think they might be pregnant. Women of child-bearing potential will need to have a negative pregnancy test before starting this medicine. There is a potential for  serious side effects to an unborn child. Talk to your health care professional or pharmacist for more information. Do not breast-feed an infant while taking this medicine or for 7 months after stopping it. Women must use effective birth control with this medicine. What side effects may I notice from receiving this medicine? Side effects that you should report to your doctor or other health care professional as soon as possible: -breathing difficulties -chest pain or palpitations -cough -dizziness or fainting -fever or chills, sore throat -skin rash, itching or hives -swelling of the legs or ankles -unusually weak or tired Side effects that usually do not require medical attention (report to your doctor or other health care professional if they continue or are bothersome): -loss of appetite -headache -muscle aches -nausea This list may not describe all possible side effects. Call your doctor for medical advice about side effects. You may report side effects to FDA at 1-800-FDA-1088. Where should I keep my medicine? This drug is given in a hospital or clinic and will not be stored at home. NOTE: This sheet is a summary. It may not cover all possible information. If you have questions about this medicine, talk to your doctor, pharmacist, or health care provider.    2016, Elsevier/Gold Standard. (2014-09-29 11:49:32)

## 2016-05-06 NOTE — Telephone Encounter (Signed)
Stephanie with [facility] returned your call to coordinate a case

## 2016-05-06 NOTE — Telephone Encounter (Signed)
Call back to [facility] regarding date options for combined case. Advised will review with Dr Talbert Nan and call her back.

## 2016-05-07 ENCOUNTER — Other Ambulatory Visit: Payer: Self-pay | Admitting: General Surgery

## 2016-05-07 ENCOUNTER — Encounter (HOSPITAL_BASED_OUTPATIENT_CLINIC_OR_DEPARTMENT_OTHER): Payer: Self-pay | Admitting: *Deleted

## 2016-05-07 DIAGNOSIS — Z170 Estrogen receptor positive status [ER+]: Secondary | ICD-10-CM

## 2016-05-07 DIAGNOSIS — C50412 Malignant neoplasm of upper-outer quadrant of left female breast: Secondary | ICD-10-CM

## 2016-05-07 NOTE — Telephone Encounter (Signed)
Patient returned call

## 2016-05-07 NOTE — Telephone Encounter (Signed)
Call to patient to follow-up with her regarding plans for a scheduling surgery after lumpectomy. Left message to call back.

## 2016-05-07 NOTE — Telephone Encounter (Signed)
Call back to [facility] at Dr Cristal Generous office. Left message requesting approximate date of clearance for proceeding with Lap BSO.

## 2016-05-07 NOTE — Telephone Encounter (Signed)
Unable to do surgery on Monday secondary to other patients. Please try and coordinate surgery with the patient at her convenience.

## 2016-05-07 NOTE — Telephone Encounter (Signed)
Stephanie from [facility] calling after speaking with Dr Donne Hazel. Dr Donne Hazel wants patient's lumpectomy case to proceed quickly and she is scheduled for Monday 05-12-16. He does not want to delay lumpectomy in order to coordinate surgery as he feels her type of cancer needs aggressive surgical approach. Colletta Maryland has talked with patient and she is aware. Junious Silk we would be able to combine with surgery case for Monday 05-12-16 but will confirm with Dr Talbert Nan. Agreeable to attempt to combine with reconstruction surgery with plastic surgeon.   Routing to Dr Talbert Nan.

## 2016-05-07 NOTE — Telephone Encounter (Signed)
Return call to patient. Advised of difficulty in combined surgery date. Patient requests we still try to combine with breast reconstruction on 05-20-16. Advised this date is not available, already filled.  Advised we could still get surergy in this year depending on clearance from general surgery and plastic surgery. patietn and I will both check on this and follow back up. Potential dates of 12-4, 11-28- and 12-12 discussed.

## 2016-05-08 ENCOUNTER — Ambulatory Visit
Admission: RE | Admit: 2016-05-08 | Discharge: 2016-05-08 | Disposition: A | Discharge status: Home / Self Care | Payer: BLUE CROSS/BLUE SHIELD | Source: Ambulatory Visit | Attending: General Surgery | Admitting: General Surgery

## 2016-05-08 DIAGNOSIS — C50412 Malignant neoplasm of upper-outer quadrant of left female breast: Secondary | ICD-10-CM

## 2016-05-09 ENCOUNTER — Encounter (HOSPITAL_BASED_OUTPATIENT_CLINIC_OR_DEPARTMENT_OTHER): Payer: Self-pay | Admitting: *Deleted

## 2016-05-09 NOTE — Pre-Procedure Instructions (Signed)
Boost Breeze 8 oz. given to pt. with instructions to drink by 0800 DOS and arrive at [facility] at 1000. Pt. acknowledged instructions.

## 2016-05-09 NOTE — Telephone Encounter (Signed)
Call to Dr Iran Planas office. Spoke to [place] with surgery scheduling. Advised need to know how soon after breast reconstruction patient will be cleared to proceed with Lap BSO. Elmyra Ricks will call back after receiving answer from Dr Iran Planas who is currently out of town.

## 2016-05-12 ENCOUNTER — Ambulatory Visit (HOSPITAL_COMMUNITY): Payer: BLUE CROSS/BLUE SHIELD

## 2016-05-12 ENCOUNTER — Ambulatory Visit (HOSPITAL_BASED_OUTPATIENT_CLINIC_OR_DEPARTMENT_OTHER)
Admission: RE | Admit: 2016-05-12 | Discharge: 2016-05-13 | Disposition: A | Discharge status: Home / Self Care | Payer: BLUE CROSS/BLUE SHIELD | Source: Ambulatory Visit | Attending: General Surgery | Admitting: General Surgery

## 2016-05-12 ENCOUNTER — Ambulatory Visit
Admission: RE | Admit: 2016-05-12 | Discharge: 2016-05-12 | Disposition: A | Discharge status: Home / Self Care | Payer: BLUE CROSS/BLUE SHIELD | Source: Ambulatory Visit | Attending: General Surgery | Admitting: General Surgery

## 2016-05-12 ENCOUNTER — Encounter (HOSPITAL_COMMUNITY)
Admission: RE | Admit: 2016-05-12 | Discharge: 2016-05-12 | Disposition: A | Discharge status: Home / Self Care | Payer: BLUE CROSS/BLUE SHIELD | Source: Ambulatory Visit | Attending: General Surgery | Admitting: General Surgery

## 2016-05-12 ENCOUNTER — Ambulatory Visit (HOSPITAL_BASED_OUTPATIENT_CLINIC_OR_DEPARTMENT_OTHER): Payer: BLUE CROSS/BLUE SHIELD | Admitting: Anesthesiology

## 2016-05-12 ENCOUNTER — Encounter (HOSPITAL_BASED_OUTPATIENT_CLINIC_OR_DEPARTMENT_OTHER): Payer: Self-pay | Admitting: Anesthesiology

## 2016-05-12 ENCOUNTER — Encounter (HOSPITAL_BASED_OUTPATIENT_CLINIC_OR_DEPARTMENT_OTHER)
Admission: RE | Disposition: A | Discharge status: Home / Self Care | Payer: Self-pay | Source: Ambulatory Visit | Attending: General Surgery

## 2016-05-12 DIAGNOSIS — K589 Irritable bowel syndrome without diarrhea: Secondary | ICD-10-CM | POA: Diagnosis not present

## 2016-05-12 DIAGNOSIS — Z170 Estrogen receptor positive status [ER+]: Secondary | ICD-10-CM

## 2016-05-12 DIAGNOSIS — Z87440 Personal history of urinary (tract) infections: Secondary | ICD-10-CM | POA: Insufficient documentation

## 2016-05-12 DIAGNOSIS — Z803 Family history of malignant neoplasm of breast: Secondary | ICD-10-CM | POA: Diagnosis not present

## 2016-05-12 DIAGNOSIS — C50412 Malignant neoplasm of upper-outer quadrant of left female breast: Secondary | ICD-10-CM | POA: Diagnosis not present

## 2016-05-12 DIAGNOSIS — Z8719 Personal history of other diseases of the digestive system: Secondary | ICD-10-CM | POA: Insufficient documentation

## 2016-05-12 DIAGNOSIS — F419 Anxiety disorder, unspecified: Secondary | ICD-10-CM | POA: Diagnosis not present

## 2016-05-12 DIAGNOSIS — Z8249 Family history of ischemic heart disease and other diseases of the circulatory system: Secondary | ICD-10-CM | POA: Diagnosis not present

## 2016-05-12 DIAGNOSIS — F329 Major depressive disorder, single episode, unspecified: Secondary | ICD-10-CM | POA: Insufficient documentation

## 2016-05-12 DIAGNOSIS — N6012 Diffuse cystic mastopathy of left breast: Secondary | ICD-10-CM | POA: Insufficient documentation

## 2016-05-12 DIAGNOSIS — Z808 Family history of malignant neoplasm of other organs or systems: Secondary | ICD-10-CM | POA: Diagnosis not present

## 2016-05-12 DIAGNOSIS — Z888 Allergy status to other drugs, medicaments and biological substances status: Secondary | ICD-10-CM | POA: Insufficient documentation

## 2016-05-12 DIAGNOSIS — Z9221 Personal history of antineoplastic chemotherapy: Secondary | ICD-10-CM | POA: Diagnosis not present

## 2016-05-12 DIAGNOSIS — K219 Gastro-esophageal reflux disease without esophagitis: Secondary | ICD-10-CM | POA: Insufficient documentation

## 2016-05-12 DIAGNOSIS — Z90710 Acquired absence of both cervix and uterus: Secondary | ICD-10-CM | POA: Insufficient documentation

## 2016-05-12 DIAGNOSIS — Z880 Allergy status to penicillin: Secondary | ICD-10-CM | POA: Insufficient documentation

## 2016-05-12 DIAGNOSIS — C50912 Malignant neoplasm of unspecified site of left female breast: Secondary | ICD-10-CM | POA: Diagnosis present

## 2016-05-12 HISTORY — PX: RADIOACTIVE SEED GUIDED PARTIAL MASTECTOMY WITH AXILLARY SENTINEL LYMPH NODE BIOPSY: SHX6520

## 2016-05-12 HISTORY — PX: BREAST LUMPECTOMY: SHX2

## 2016-05-12 SURGERY — RADIOACTIVE SEED GUIDED PARTIAL MASTECTOMY WITH AXILLARY SENTINEL LYMPH NODE BIOPSY
Anesthesia: General | Site: Breast | Laterality: Left

## 2016-05-12 MED ORDER — BUPIVACAINE HCL (PF) 0.25 % IJ SOLN
INTRAMUSCULAR | Status: DC | PRN
  Administered 2016-05-12: 7 mL

## 2016-05-12 MED ORDER — FENTANYL CITRATE (PF) 100 MCG/2ML IJ SOLN
INTRAMUSCULAR | Status: AC
  Filled 2016-05-12: qty 2

## 2016-05-12 MED ORDER — OXYCODONE HCL 5 MG PO TABS: 5.0000 mg | ORAL_TABLET | Freq: Four times a day (QID) | ORAL | 0 refills | Status: DC | PRN

## 2016-05-12 MED ORDER — HYDROMORPHONE HCL 1 MG/ML IJ SOLN
INTRAMUSCULAR | Status: AC
  Filled 2016-05-12: qty 1

## 2016-05-12 MED ORDER — PROPOFOL 10 MG/ML IV BOLUS
INTRAVENOUS | Status: DC | PRN
  Administered 2016-05-12: 150 mg via INTRAVENOUS

## 2016-05-12 MED ORDER — ACETAMINOPHEN 650 MG RE SUPP: 650.0000 mg | Freq: Four times a day (QID) | RECTAL | Status: DC | PRN

## 2016-05-12 MED ORDER — GABAPENTIN 300 MG PO CAPS: 300.0000 mg | ORAL_CAPSULE | ORAL | Status: DC

## 2016-05-12 MED ORDER — CHLORHEXIDINE GLUCONATE CLOTH 2 % EX PADS: 6.0000 | MEDICATED_PAD | Freq: Once | CUTANEOUS | Status: DC

## 2016-05-12 MED ORDER — CIPROFLOXACIN IN D5W 400 MG/200ML IV SOLN: 400.0000 mg | INTRAVENOUS | Status: DC

## 2016-05-12 MED ORDER — DEXAMETHASONE SODIUM PHOSPHATE 4 MG/ML IJ SOLN
INTRAMUSCULAR | Status: DC | PRN
  Administered 2016-05-12: 10 mg via INTRAVENOUS

## 2016-05-12 MED ORDER — SCOPOLAMINE 1 MG/3DAYS TD PT72
1.0000 | MEDICATED_PATCH | Freq: Once | TRANSDERMAL | Status: DC | PRN
  Administered 2016-05-12: 1.5 mg via TRANSDERMAL

## 2016-05-12 MED ORDER — MIDAZOLAM HCL 2 MG/2ML IJ SOLN
INTRAMUSCULAR | Status: AC
  Filled 2016-05-12: qty 2

## 2016-05-12 MED ORDER — HYDROMORPHONE HCL 1 MG/ML IJ SOLN
0.2500 mg | INTRAMUSCULAR | Status: DC | PRN
  Administered 2016-05-12 (×3): 0.5 mg via INTRAVENOUS

## 2016-05-12 MED ORDER — DEXAMETHASONE SODIUM PHOSPHATE 10 MG/ML IJ SOLN
INTRAMUSCULAR | Status: AC
  Filled 2016-05-12: qty 1

## 2016-05-12 MED ORDER — SERTRALINE HCL 100 MG PO TABS: 100.0000 mg | ORAL_TABLET | Freq: Every day | ORAL | Status: DC

## 2016-05-12 MED ORDER — MORPHINE SULFATE (PF) 2 MG/ML IV SOLN
2.0000 mg | INTRAVENOUS | Status: DC | PRN
  Administered 2016-05-12 – 2016-05-13 (×2): 2 mg via INTRAVENOUS

## 2016-05-12 MED ORDER — VANCOMYCIN HCL IN DEXTROSE 1-5 GM/200ML-% IV SOLN
INTRAVENOUS | Status: AC
  Filled 2016-05-12: qty 200

## 2016-05-12 MED ORDER — FENTANYL CITRATE (PF) 100 MCG/2ML IJ SOLN
50.0000 ug | INTRAMUSCULAR | Status: AC | PRN
  Administered 2016-05-12: 100 ug via INTRAVENOUS
  Administered 2016-05-12 (×3): 25 ug via INTRAVENOUS

## 2016-05-12 MED ORDER — PROPOFOL 10 MG/ML IV BOLUS
INTRAVENOUS | Status: AC
  Filled 2016-05-12: qty 20

## 2016-05-12 MED ORDER — SODIUM CHLORIDE 0.9 % IJ SOLN
INTRAVENOUS | Status: DC | PRN
  Administered 2016-05-12: 5 mL via INTRAMUSCULAR

## 2016-05-12 MED ORDER — NAPROXEN 500 MG PO TABS: 500.0000 mg | ORAL_TABLET | Freq: Two times a day (BID) | ORAL | Status: DC | PRN

## 2016-05-12 MED ORDER — ACETAMINOPHEN 500 MG PO TABS
ORAL_TABLET | ORAL | Status: AC
  Filled 2016-05-12: qty 2

## 2016-05-12 MED ORDER — SCOPOLAMINE 1 MG/3DAYS TD PT72
MEDICATED_PATCH | TRANSDERMAL | Status: AC
  Filled 2016-05-12: qty 1

## 2016-05-12 MED ORDER — SIMETHICONE 80 MG PO CHEW: 40.0000 mg | CHEWABLE_TABLET | Freq: Four times a day (QID) | ORAL | Status: DC | PRN

## 2016-05-12 MED ORDER — OXYCODONE HCL 5 MG PO TABS
5.0000 mg | ORAL_TABLET | ORAL | Status: DC | PRN
  Administered 2016-05-12 (×2): 10 mg via ORAL
  Administered 2016-05-12: 5 mg via ORAL
  Administered 2016-05-13: 10 mg via ORAL
  Filled 2016-05-12 (×2): qty 2
  Filled 2016-05-12: qty 1
  Filled 2016-05-12 (×2): qty 2

## 2016-05-12 MED ORDER — LACTATED RINGERS IV SOLN
INTRAVENOUS | Status: DC
  Administered 2016-05-12: 11:00:00 via INTRAVENOUS

## 2016-05-12 MED ORDER — ACETAMINOPHEN 500 MG PO TABS
1000.0000 mg | ORAL_TABLET | ORAL | Status: AC
  Administered 2016-05-12: 1000 mg via ORAL

## 2016-05-12 MED ORDER — LORAZEPAM 1 MG PO TABS
1.0000 mg | ORAL_TABLET | Freq: Three times a day (TID) | ORAL | Status: DC | PRN
  Administered 2016-05-12 (×2): 1 mg via ORAL
  Filled 2016-05-12 (×2): qty 1

## 2016-05-12 MED ORDER — ONDANSETRON HCL 4 MG/2ML IJ SOLN
INTRAMUSCULAR | Status: DC | PRN
  Administered 2016-05-12: 4 mg via INTRAVENOUS

## 2016-05-12 MED ORDER — MORPHINE SULFATE (PF) 4 MG/ML IV SOLN
INTRAVENOUS | Status: AC
  Filled 2016-05-12: qty 1

## 2016-05-12 MED ORDER — GABAPENTIN 300 MG PO CAPS
300.0000 mg | ORAL_CAPSULE | ORAL | Status: AC
  Administered 2016-05-12: 300 mg via ORAL

## 2016-05-12 MED ORDER — LIDOCAINE 2% (20 MG/ML) 5 ML SYRINGE
INTRAMUSCULAR | Status: AC
  Filled 2016-05-12: qty 5

## 2016-05-12 MED ORDER — LIDOCAINE HCL (CARDIAC) 20 MG/ML IV SOLN
INTRAVENOUS | Status: DC | PRN
  Administered 2016-05-12: 30 mg via INTRAVENOUS

## 2016-05-12 MED ORDER — ONDANSETRON HCL 4 MG/2ML IJ SOLN
INTRAMUSCULAR | Status: AC
  Filled 2016-05-12: qty 2

## 2016-05-12 MED ORDER — MIDAZOLAM HCL 2 MG/2ML IJ SOLN
1.0000 mg | INTRAMUSCULAR | Status: DC | PRN
  Administered 2016-05-12: 2 mg via INTRAVENOUS
  Administered 2016-05-12: 1 mg via INTRAVENOUS

## 2016-05-12 MED ORDER — SCOPOLAMINE 1 MG/3DAYS TD PT72: 1.0000 | MEDICATED_PATCH | TRANSDERMAL | Status: DC

## 2016-05-12 MED ORDER — ONDANSETRON 4 MG PO TBDP: 4.0000 mg | ORAL_TABLET | Freq: Four times a day (QID) | ORAL | Status: DC | PRN

## 2016-05-12 MED ORDER — ACETAMINOPHEN 325 MG PO TABS: 650.0000 mg | ORAL_TABLET | Freq: Four times a day (QID) | ORAL | Status: DC | PRN

## 2016-05-12 MED ORDER — CIPROFLOXACIN IN D5W 400 MG/200ML IV SOLN
INTRAVENOUS | Status: AC
  Filled 2016-05-12: qty 200

## 2016-05-12 MED ORDER — GABAPENTIN 300 MG PO CAPS
ORAL_CAPSULE | ORAL | Status: AC
  Filled 2016-05-12: qty 1

## 2016-05-12 MED ORDER — ACETAMINOPHEN 500 MG PO TABS: 1000.0000 mg | ORAL_TABLET | ORAL | Status: DC

## 2016-05-12 MED ORDER — ONDANSETRON HCL 4 MG/2ML IJ SOLN: 4.0000 mg | Freq: Four times a day (QID) | INTRAMUSCULAR | Status: DC | PRN

## 2016-05-12 MED ORDER — VANCOMYCIN HCL IN DEXTROSE 1-5 GM/200ML-% IV SOLN
1000.0000 mg | INTRAVENOUS | Status: AC
  Administered 2016-05-12: 1000 mg via INTRAVENOUS

## 2016-05-12 MED ORDER — SODIUM CHLORIDE 0.9 % IV SOLN
INTRAVENOUS | Status: DC
  Administered 2016-05-12: 15:00:00 via INTRAVENOUS

## 2016-05-12 MED ORDER — TECHNETIUM TC 99M SULFUR COLLOID FILTERED
1.0000 | Freq: Once | INTRAVENOUS | Status: AC | PRN
  Administered 2016-05-12: 1 via INTRADERMAL

## 2016-05-12 MED ORDER — PROMETHAZINE HCL 25 MG/ML IJ SOLN: 6.2500 mg | INTRAMUSCULAR | Status: DC | PRN

## 2016-05-12 SURGICAL SUPPLY — 43 items
BINDER BREAST LRG (GAUZE/BANDAGES/DRESSINGS) IMPLANT
BINDER BREAST XLRG (GAUZE/BANDAGES/DRESSINGS) IMPLANT
BLADE SURG 15 STRL LF DISP TIS (BLADE) ×1 IMPLANT
BLADE SURG 15 STRL SS (BLADE) ×1
CHLORAPREP W/TINT 26ML (MISCELLANEOUS) ×2 IMPLANT
CLIP TI WIDE RED SMALL 6 (CLIP) ×2 IMPLANT
COVER BACK TABLE 60X90IN (DRAPES) ×2 IMPLANT
COVER MAYO STAND STRL (DRAPES) ×2 IMPLANT
COVER PROBE W GEL 5X96 (DRAPES) ×2 IMPLANT
DERMABOND ADVANCED (GAUZE/BANDAGES/DRESSINGS) ×1
DERMABOND ADVANCED .7 DNX12 (GAUZE/BANDAGES/DRESSINGS) ×1 IMPLANT
DEVICE DUBIN W/COMP PLATE 8390 (MISCELLANEOUS) ×2 IMPLANT
DRAPE LAPAROSCOPIC ABDOMINAL (DRAPES) ×2 IMPLANT
DRAPE UTILITY XL STRL (DRAPES) ×2 IMPLANT
ELECT COATED BLADE 2.86 ST (ELECTRODE) ×2 IMPLANT
ELECT REM PT RETURN 9FT ADLT (ELECTROSURGICAL) ×2
ELECTRODE REM PT RTRN 9FT ADLT (ELECTROSURGICAL) ×1 IMPLANT
GLOVE BIO SURGEON STRL SZ7 (GLOVE) ×4 IMPLANT
GLOVE BIOGEL PI IND STRL 7.0 (GLOVE) ×2 IMPLANT
GLOVE BIOGEL PI IND STRL 7.5 (GLOVE) ×1 IMPLANT
GLOVE BIOGEL PI INDICATOR 7.0 (GLOVE) ×2
GLOVE BIOGEL PI INDICATOR 7.5 (GLOVE) ×1
GLOVE ECLIPSE 6.5 STRL STRAW (GLOVE) ×2 IMPLANT
GOWN STRL REUS W/ TWL LRG LVL3 (GOWN DISPOSABLE) ×2 IMPLANT
GOWN STRL REUS W/TWL LRG LVL3 (GOWN DISPOSABLE) ×2
ILLUMINATOR WAVEGUIDE N/F (MISCELLANEOUS) ×2 IMPLANT
KIT MARKER MARGIN INK (KITS) ×2 IMPLANT
NDL SAFETY ECLIPSE 18X1.5 (NEEDLE) ×1 IMPLANT
NEEDLE HYPO 18GX1.5 SHARP (NEEDLE) ×1
NEEDLE HYPO 25X1 1.5 SAFETY (NEEDLE) ×4 IMPLANT
PACK BASIN DAY SURGERY FS (CUSTOM PROCEDURE TRAY) ×2 IMPLANT
PENCIL BUTTON HOLSTER BLD 10FT (ELECTRODE) ×2 IMPLANT
SLEEVE SCD COMPRESS KNEE MED (MISCELLANEOUS) ×2 IMPLANT
SPONGE LAP 4X18 X RAY DECT (DISPOSABLE) ×2 IMPLANT
STRIP CLOSURE SKIN 1/2X4 (GAUZE/BANDAGES/DRESSINGS) ×2 IMPLANT
SUT MNCRL AB 4-0 PS2 18 (SUTURE) ×2 IMPLANT
SUT VIC AB 2-0 SH 27 (SUTURE) ×2
SUT VIC AB 2-0 SH 27XBRD (SUTURE) ×2 IMPLANT
SUT VIC AB 3-0 SH 27 (SUTURE) ×1
SUT VIC AB 3-0 SH 27X BRD (SUTURE) ×1 IMPLANT
SYR CONTROL 10ML LL (SYRINGE) ×2 IMPLANT
TOWEL OR 17X24 6PK STRL BLUE (TOWEL DISPOSABLE) ×2 IMPLANT
TOWEL OR NON WOVEN STRL DISP B (DISPOSABLE) ×2 IMPLANT

## 2016-05-12 NOTE — Op Note (Signed)
Preoperative diagnosis: Left breast cancer s/p primary chemotherapy Postoperative diagnosis: same as above Procedure: 1. Leftbreast seed guided lumpectomy  2. Leftaxillary sentinel nodebiopsy 3. Injection blue dye for sentinel node identification Surgeon Dr Serita Grammes Anes general with pectoral block EBL: minimal Comps none Specimen:  1. Left breast marked with paint 2. Leftaxillary nodes with highest count of 321, one node blue Sponge count correct at completion dispo to recovery stable  Indications: This is a 62 yof who has undergone primary chemotherapy. She has moderate radiologic response but a very good clinical response.  Nodes have been clinically and radiologically negative.  We discussed options and decided to proceed with left lumpectomy and sn biopsy.   Procedure: After informed consent was obtained the patient was taken to the OR. She was injected with technetium in the standard periareolar fashion. She underwent a pectoral block. She was given anitibiotics. SCDs were in place. She was prepped and draped in the standard sterile surgical fashion. A timeout was performed. She was first injected in periareolar fashion with blue dye and this was massaged for 2 minutes I then located the see in the uoq.  I made an axillary incision and did the entire procedure through this incision.  I used the lighted retractor system to tunnel to the lesion. I then removed the seed with an attempt to get a clear margin. This was passed off the table after marking with paint. The seed was very anterior and I removed this separately.The anterior margin is the skin and the posterior margin is the muscle.  I did confirm removal of theclipand seedwith mammography.I placed clips in the cavity. I went through the axillary fascia. I identified the sentinel nodes with the highest count as above. There was no background radioactivity. I obtained hemostasis. I then closed the fascia with  2-0 vicryl.  I closed the breast tissue with 2-0 vicryl. I closed the skin with 3-0 vicryl and 4-0 monocryl. Glue and steristrips were placed A breast binder was placed. She was extubated and transferred to recovery stable

## 2016-05-12 NOTE — Anesthesia Procedure Notes (Addendum)
Anesthesia Regional Block:  Pectoralis block  Pre-Anesthetic Checklist: ,, timeout performed, Correct Patient, Correct Site, Correct Laterality, Correct Procedure, Correct Position, site marked, Risks and benefits discussed,  Surgical consent,  Pre-op evaluation,  At surgeon's request and post-op pain management  Laterality: Left  Prep: chloraprep       Needles:  Injection technique: Single-shot  Needle Type: Echogenic Stimulator Needle     Needle Length: 10cm 10 cm Needle Gauge: 21 and 21 G    Additional Needles:  Procedures: ultrasound guided (picture in chart) Pectoralis block Narrative:  Start time: 05/12/2016 11:08 AM End time: 05/12/2016 11:18 AM Injection made incrementally with aspirations every 5 mL.  Performed by: Personally

## 2016-05-12 NOTE — H&P (Signed)
[name] is an 40 y.o. female.   Chief Complaint: left breast cancer HPI:   32 yof who presented after noting a left breast mass in the upper outer quadrant a month ago when she was living in [place]. Her [place] report shows this is a 1.7 cm tumor. I have a path report that says that is invasive ductal carcinoma, grade II, er pos, pr neg, her2 is equivocal. repeat her 2 is also equivocal. since our last visit she has undergone evaluation for genetics which is now negative. bilateral breast mri shows right breast negative, left breast has irregular enhancing mass that measures 2.5x2.2x1.8 cm in size with nme extending 1.5 cm anteriorly and 2 cm posteriorly. total nme is 5.3 cm. nodes are normal. she had port placement and has undergone primary chemotherapy which she has tolerated well. she completed chemo 10/10. she has not felt the mass for some time now. Mri shows nothing new and area smaller. she had previously seen Dr Iran Planas to discuss bilateral reduction after lumpectomy/sn.    Past Medical History:  Diagnosis Date  . Allergy   . Anxiety    has panic attacks  . Asthma    childhood, none now  . Breast cancer ([place]) 11/20/15   left breast  . Depression   . Endometriosis   . Family history of breast cancer   . GERD (gastroesophageal reflux disease)   . Herniated disc, cervical   . History of bronchitis as a child   . History of cancer chemotherapy    left breast  . History of stomach ulcers   . History of urinary tract infection   . Hormone disorder   . IBD (inflammatory bowel disease)   . Migraine without aura   . Neck pain   . PONV (postoperative nausea and vomiting)   . Shortness of breath dyspnea    anxiety related    Past Surgical History:  Procedure Laterality Date  . ABDOMINAL HYSTERECTOMY    . laparoscopically assisted vaginal hysterectomy  01/2006   Still has ovaries  . LAPAROSCOPY     x 2, diagnosed with endometriosis (prior to hysterectomy), only treated  medically.  Marland Kitchen PORTACATH PLACEMENT Right 12/25/2015   Procedure: INSERTION PORT-A-CATH WITH ULTRA SOUND;  Surgeon: [name], MD;  Location: [facility];  Service: General;  Laterality: Right;    Family History  Problem Relation Age of Onset  . Sarcoidosis Mother   . Hypertension Father   . Throat cancer Maternal Grandfather     smoker and heavy drinker; dx in his late 44s-50s  . Cancer Paternal Grandmother     possible gatric vs bladder cancer  . Bladder Cancer Paternal Grandmother   . Breast cancer Paternal Aunt     dxin her 25s; dad's maternal half sister  . Breast cancer Other     PGFs mother  . Anesthesia problems Neg Hx   . Hypotension Neg Hx   . Malignant hyperthermia Neg Hx   . Pseudochol deficiency Neg Hx    Social History:  reports that she has never smoked. She has never used smokeless tobacco. She reports that she drinks alcohol. She reports that she does not use drugs.  Allergies:  Allergies  Allergen Reactions  . Amoxicillin Shortness Of Breath and Itching    Has patient had a PCN reaction causing immediate rash, facial/tongue/throat swelling, SOB or lightheadedness with hypotension: Yes Has patient had a PCN reaction causing severe rash involving mucus membranes or skin necrosis: No Has  patient had a PCN reaction that required hospitalization Yes Has patient had a PCN reaction occurring within the last 10 years: No If all of the above answers are "NO", then may proceed with Cephalosporin use.   . Diflucan [Fluconazole] Nausea Only    heartburn    Medications Prior to Admission  Medication Sig Dispense Refill  . LORazepam (ATIVAN) 0.5 MG tablet TAKE 1 TABLET AT BEDTIME AS NEEDED FOR NAUSEA AND VOMITING 30 tablet 0  . sertraline (ZOLOFT) 100 MG tablet Take 1 tablet (100 mg total) by mouth daily. 30 tablet 3  . traMADol (ULTRAM) 50 MG tablet TAKE 1 TABLET EVERY 6 HOURS 60 tablet 0  . valACYclovir (VALTREX) 500 MG tablet Take 1 tablet (500 mg total) by mouth  daily. 60 tablet 1  . zolpidem (AMBIEN) 10 MG tablet Take 1/2 to 1 tab po qhs prn 30 tablet 0  . diphenoxylate-atropine (LOMOTIL) 2.5-0.025 MG tablet Take 2 tablets by mouth 4 (four) times daily as needed for diarrhea or loose stools. 30 tablet 0  . ondansetron (ZOFRAN) 8 MG tablet Take 1 tablet (8 mg total) by mouth every 8 (eight) hours as needed for nausea or vomiting. 30 tablet 2    No results found for this or any previous visit (from the past 48 hour(s)). No results found.  ROS Negative  Blood pressure 131/84, pulse 95, temperature 97.6 F (36.4 C), temperature source Oral, resp. rate 20, height '5\' 4"'  (1.626 m), weight 88.9 kg (196 lb), SpO2 100 %. Physical Exam   General Mental Status-Alert. Orientation-Oriented X3. Chest and Lung Exam Chest and lung exam reveals -on auscultation, normal breath sounds, no adventitious sounds and normal vocal resonance. Breast Nipples-No Discharge. Breast Lump-No Palpable Breast Mass. Cardiovascular Cardiovascular examination reveals -normal heart sounds, regular rate and rhythm with no murmurs. Lymphatic Head & Neck General Head & Neck Lymphatics: Bilateral - Description - Normal. Axillary General Axillary Region: Bilateral - Description - Normal. Note: no [place] adenopathy  Assessment/Plan   Assessment & Plan [name] MD; 04/25/2016 8:19 PM) BREAST CANCER OF UPPER-OUTER QUADRANT OF LEFT FEMALE BREAST (C50.412)  left breast seed guided lumpectomy, left axillary sn biopsyfollowed by bilateral oncoplastic reduction We discussed a sentinel lymph node biopsy as she does not appear to having lymph node involvement right now. We discussed the performance of that with injection of radioactive tracer. We discussed that there is a chance of having a positive node with a sentinel lymph node biopsy and we will await the permanent pathology to make any other first further decisions in terms of her treatment. One of these options  might be to return to the operating room to perform an axillary lymph node dissection. We discussed up to a 5% risk lifetime of chronic shoulder pain as well as lymphedema associated with a sentinel lymph node biopsy. We discussed the options for treatment of the breast cancer which included lumpectomy versus a mastectomy. We discussed the performance of the lumpectomy with radioactive seed placement. We discussed a 5-10% chance of a positive margin requiring reexcision in the operating room. We also discussed that she will need radiation therapy if she undergoes lumpectomy. We discussed the mastectomy (removal of whole breast) and the postoperative care for that as well. Mastectomy can be followed by reconstruction. The decision for lumpectomy vs mastectomy has no impact on decision for chemotherapy. Most mastectomy patients will not need radiation therapy. We discussed that there is no difference in her survival whether she undergoes lumpectomy with radiation  therapy or antiestrogen therapy versus a mastectomy. There is also no real difference between her recurrence in the breast. We discussed the risks of operation including bleeding, infection, possible reoperation. She is interested in oophorectomy due to cysts at same time. I will discuss with Aggie Cosier, MD 05/12/2016, 11:39 AM

## 2016-05-12 NOTE — Discharge Instructions (Signed)
[facility] °Office Phone Number [phone] ° ° POST OP INSTRUCTIONS ° °Always review your discharge instruction sheet given to you by the facility where your surgery was performed. ° °IF YOU HAVE DISABILITY OR FAMILY LEAVE FORMS, YOU MUST BRING THEM TO THE OFFICE FOR PROCESSING.  DO NOT GIVE THEM TO YOUR DOCTOR. ° °1. A prescription for pain medication may be given to you upon discharge.  Take your pain medication as prescribed, if needed.  If narcotic pain medicine is not needed, then you may take acetaminophen (Tylenol), naprosyn (Alleve) or ibuprofen (Advil) as needed. °2. Take your usually prescribed medications unless otherwise directed °3. If you need a refill on your pain medication, please contact your pharmacy.  They will contact our office to request authorization.  Prescriptions will not be filled after 5pm or on week-ends. °4. You should eat very light the first 24 hours after surgery, such as soup, crackers, pudding, etc.  Resume your normal diet the day after surgery. °5. Most patients will experience some swelling and bruising in the breast.  Ice packs and a good support bra will help.  Wear the breast binder provided or a sports bra for 72 hours day and night.  After that wear a sports bra during the day until you return to the office. Swelling and bruising can take several days to resolve.  °6. It is common to experience some constipation if taking pain medication after surgery.  Increasing fluid intake and taking a stool softener will usually help or prevent this problem from occurring.  A mild laxative (Milk of Magnesia or Miralax) should be taken according to package directions if there are no bowel movements after 48 hours. °7. Unless discharge instructions indicate otherwise, you may remove your bandages 48 hours after surgery and you may shower at that time.  You may have steri-strips (small skin tapes) in place directly over the incision.  These strips should be left on the  skin for 7-10 days and will come off on their own.  If your surgeon used skin glue on the incision, you may shower in 24 hours.  The glue will flake off over the next 2-3 weeks.  Any sutures or staples will be removed at the office during your follow-up visit. °8. ACTIVITIES:  You may resume regular daily activities (gradually increasing) beginning the next day.  Wearing a good support bra or sports bra minimizes pain and swelling.  You may have sexual intercourse when it is comfortable. °a. You may drive when you no longer are taking prescription pain medication, you can comfortably wear a seatbelt, and you can safely maneuver your car and apply brakes. °b. RETURN TO WORK:  ______________________________________________________________________________________ °9. You should see your doctor in the office for a follow-up appointment approximately two weeks after your surgery.  Your doctor’s nurse will typically make your follow-up appointment when she calls you with your pathology report.  Expect your pathology report 3-4 business days after your surgery.  You may call to check if you do not hear from us after three days. °10. OTHER INSTRUCTIONS: _______________________________________________________________________________________________ _____________________________________________________________________________________________________________________________________ °_____________________________________________________________________________________________________________________________________ °_____________________________________________________________________________________________________________________________________ ° °WHEN TO CALL DR WAKEFIELD: °1. Fever over 101.0 °2. Nausea and/or vomiting. °3. Extreme swelling or bruising. °4. Continued bleeding from incision. °5. Increased pain, redness, or drainage from the incision. ° °The clinic staff is available to answer your questions during regular  business hours.  Please don’t hesitate to call and ask to speak to one of the nurses for clinical concerns.    you have a medical emergency, go to the nearest emergency room or call 911.  A surgeon from [facility] is always on call at the hospital.  For further questions, please visit [email or web address]    Post Anesthesia Home Care Instructions  Activity: Get plenty of rest for the remainder of the day. A responsible adult should stay with you for 24 hours following the procedure.  For the next 24 hours, DO NOT: -Drive a car -Paediatric nurse -Drink alcoholic beverages -Take any medication unless instructed by your physician -Make any legal decisions or sign important papers.  Meals: Start with liquid foods such as gelatin or soup. Progress to regular foods as tolerated. Avoid greasy, spicy, heavy foods. If nausea and/or vomiting occur, drink only clear liquids until the nausea and/or vomiting subsides. Call your physician if vomiting continues.  Special Instructions/Symptoms: Your throat may feel dry or sore from the anesthesia or the breathing tube placed in your throat during surgery. If this causes discomfort, gargle with warm salt water. The discomfort should disappear within 24 hours.  If you had a scopolamine patch placed behind your ear for the management of post- operative nausea and/or vomiting:  1. The medication in the patch is effective for 72 hours, after which it should be removed.  Wrap patch in a tissue and discard in the trash. Wash hands thoroughly with soap and water. 2. You may remove the patch earlier than 72 hours if you experience unpleasant side effects which may include dry mouth, dizziness or visual disturbances. 3. Avoid touching the patch. Wash your hands with soap and water after contact with the patch.     Regional Anesthesia Blocks  1. Numbness or the inability to move the "blocked" extremity may last from 3-48 hours after  placement. The length of time depends on the medication injected and your individual response to the medication. If the numbness is not going away after 48 hours, call your surgeon.  2. The extremity that is blocked will need to be protected until the numbness is gone and the  Strength has returned. Because you cannot feel it, you will need to take extra care to avoid injury. Because it may be weak, you may have difficulty moving it or using it. You may not know what position it is in without looking at it while the block is in effect.  3. For blocks in the legs and feet, returning to weight bearing and walking needs to be done carefully. You will need to wait until the numbness is entirely gone and the strength has returned. You should be able to move your leg and foot normally before you try and bear weight or walk. You will need someone to be with you when you first try to ensure you do not fall and possibly risk injury.  4. Bruising and tenderness at the needle site are common side effects and will resolve in a few days.  5. Persistent numbness or new problems with movement should be communicated to the surgeon or the [facility] [phone] [place] [phone]).

## 2016-05-12 NOTE — Anesthesia Procedure Notes (Signed)
Procedure Name: LMA Insertion Date/Time: 05/12/2016 11:55 AM Performed by: BLOCKER, TIMOTHY D Pre-anesthesia Checklist: Patient identified, Emergency Drugs available, Suction available and Patient being monitored Patient Re-evaluated:Patient Re-evaluated prior to inductionOxygen Delivery Method: Circle system utilized Preoxygenation: Pre-oxygenation with 100% oxygen Intubation Type: IV induction Ventilation: Mask ventilation without difficulty LMA: LMA inserted LMA Size: 4.0 Number of attempts: 1 Airway Equipment and Method: Bite block Placement Confirmation: positive ETCO2 Tube secured with: Tape Dental Injury: Teeth and Oropharynx as per pre-operative assessment

## 2016-05-12 NOTE — Interval H&P Note (Signed)
History and Physical Interval Note:  05/12/2016 11:41 AM  [name]  has presented today for surgery, with the diagnosis of LEFT BREAST CANCER  The various methods of treatment have been discussed with the patient and family. After consideration of risks, benefits and other options for treatment, the patient has consented to  Procedure(s): BREAST LUMPECTOMY WITH RADIOACTIVE SEED AND SENTINEL LYMPH NODE BIOPSY AND BLUE DYE INJECTION (Left) as a surgical intervention .  The patient's history has been reviewed, patient examined, no change in status, stable for surgery.  I have reviewed the patient's chart and labs.  Questions were answered to the patient's satisfaction.     WAKEFIELD,MATTHEW

## 2016-05-12 NOTE — Anesthesia Preprocedure Evaluation (Signed)
Anesthesia Evaluation  Patient identified by MRN, date of birth, ID band Patient awake    Reviewed: Allergy & Precautions, NPO status , Patient's Chart, lab work & pertinent test results  History of Anesthesia Complications (+) PONV and history of anesthetic complications  Airway Mallampati: II   Neck ROM: full    Dental no notable dental hx. (+) Dental Advisory Given   Pulmonary shortness of breath, asthma ,    breath sounds clear to auscultation       Cardiovascular negative cardio ROS   Rhythm:regular Rate:Normal     Neuro/Psych  Headaches, PSYCHIATRIC DISORDERS Anxiety Depression    GI/Hepatic GERD  ,  Endo/Other    Renal/GU      Musculoskeletal   Abdominal   Peds  Hematology   Anesthesia Other Findings   Reproductive/Obstetrics Breast CA                             Anesthesia Physical  Anesthesia Plan  ASA: II  Anesthesia Plan: General   Post-op Pain Management:    Induction: Intravenous  Airway Management Planned: LMA  Additional Equipment:   Intra-op Plan:   Post-operative Plan: Extubation in OR  Informed Consent: I have reviewed the patients History and Physical, chart, labs and discussed the procedure including the risks, benefits and alternatives for the proposed anesthesia with the patient or authorized representative who has indicated his/her understanding and acceptance.   Dental advisory given  Plan Discussed with: CRNA, Anesthesiologist and Surgeon  Anesthesia Plan Comments:         Anesthesia Quick Evaluation

## 2016-05-12 NOTE — Anesthesia Postprocedure Evaluation (Signed)
Anesthesia Post Note  Patient: [name]  Procedure(s) Performed: Procedure(s) (LRB): BREAST LUMPECTOMY WITH RADIOACTIVE SEED AND SENTINEL LYMPH NODE BIOPSY AND BLUE DYE INJECTION (Left)  Patient location during evaluation: PACU Anesthesia Type: General and Regional Level of consciousness: awake and alert Pain management: pain level controlled Vital Signs Assessment: post-procedure vital signs reviewed and stable Respiratory status: spontaneous breathing, nonlabored ventilation, respiratory function stable and patient connected to nasal cannula oxygen Cardiovascular status: blood pressure returned to baseline and stable Postop Assessment: no signs of nausea or vomiting Anesthetic complications: no    Last Vitals:  Vitals:   05/12/16 1409 05/12/16 1415  BP:  (!) 136/93  Pulse: (!) 101 98  Resp: 13 (!) 8  Temp:      Last Pain:  Vitals:   05/12/16 1409  TempSrc:   PainSc: 10-Worst pain ever                 Effie Berkshire

## 2016-05-12 NOTE — Transfer of Care (Signed)
Immediate Anesthesia Transfer of Care Note  Patient: [name]  Procedure(s) Performed: Procedure(s): BREAST LUMPECTOMY WITH RADIOACTIVE SEED AND SENTINEL LYMPH NODE BIOPSY AND BLUE DYE INJECTION (Left)  Patient Location: PACU  Anesthesia Type:GA combined with regional for post-op pain  Level of Consciousness: sedated, confused and responds to stimulation  Airway & Oxygen Therapy: Patient Spontanous Breathing and Patient connected to face mask oxygen  Post-op Assessment: Report given to RN and Post -op Vital signs reviewed and stable  Post vital signs: Reviewed and stable  Last Vitals:  Vitals:   05/12/16 1135 05/12/16 1140  BP:    Pulse: 100 100  Resp: (!) 7 11  Temp:      Last Pain:  Vitals:   05/12/16 1014  TempSrc: Oral         Complications: No apparent anesthesia complications

## 2016-05-12 NOTE — Progress Notes (Signed)
Assisted Dr. Singer with left, ultrasound guided, pectoralis block. Side rails up, monitors on throughout procedure. See vital signs in flow sheet. Tolerated Procedure well. °

## 2016-05-13 ENCOUNTER — Encounter (HOSPITAL_BASED_OUTPATIENT_CLINIC_OR_DEPARTMENT_OTHER): Payer: Self-pay | Admitting: General Surgery

## 2016-05-13 DIAGNOSIS — C50412 Malignant neoplasm of upper-outer quadrant of left female breast: Secondary | ICD-10-CM | POA: Diagnosis not present

## 2016-05-13 MED ORDER — METHOCARBAMOL 750 MG PO TABS: 750.0000 mg | ORAL_TABLET | Freq: Four times a day (QID) | ORAL | 0 refills | Status: DC | PRN

## 2016-05-13 MED ORDER — MORPHINE SULFATE (PF) 4 MG/ML IV SOLN
INTRAVENOUS | Status: AC
  Filled 2016-05-13: qty 1

## 2016-05-14 ENCOUNTER — Encounter (HOSPITAL_COMMUNITY): Payer: Self-pay | Admitting: *Deleted

## 2016-05-19 NOTE — Progress Notes (Signed)
Pt. Given boost drink now with specific instructions, along with reminder note. Pt verbalized understanding.

## 2016-05-20 ENCOUNTER — Ambulatory Visit (HOSPITAL_BASED_OUTPATIENT_CLINIC_OR_DEPARTMENT_OTHER): Payer: BLUE CROSS/BLUE SHIELD | Admitting: Anesthesiology

## 2016-05-20 ENCOUNTER — Encounter (HOSPITAL_BASED_OUTPATIENT_CLINIC_OR_DEPARTMENT_OTHER)
Admission: RE | Disposition: A | Discharge status: Home / Self Care | Payer: Self-pay | Source: Ambulatory Visit | Attending: Plastic Surgery

## 2016-05-20 ENCOUNTER — Encounter (HOSPITAL_BASED_OUTPATIENT_CLINIC_OR_DEPARTMENT_OTHER): Payer: Self-pay | Admitting: *Deleted

## 2016-05-20 ENCOUNTER — Ambulatory Visit (HOSPITAL_COMMUNITY)
Admission: RE | Admit: 2016-05-20 | Discharge: 2016-05-21 | Disposition: A | Discharge status: Home / Self Care | Payer: BLUE CROSS/BLUE SHIELD | Source: Ambulatory Visit | Attending: Plastic Surgery | Admitting: Plastic Surgery

## 2016-05-20 DIAGNOSIS — C50412 Malignant neoplasm of upper-outer quadrant of left female breast: Secondary | ICD-10-CM | POA: Diagnosis not present

## 2016-05-20 DIAGNOSIS — K219 Gastro-esophageal reflux disease without esophagitis: Secondary | ICD-10-CM | POA: Diagnosis not present

## 2016-05-20 DIAGNOSIS — J45909 Unspecified asthma, uncomplicated: Secondary | ICD-10-CM | POA: Diagnosis not present

## 2016-05-20 DIAGNOSIS — Z428 Encounter for other plastic and reconstructive surgery following medical procedure or healed injury: Secondary | ICD-10-CM | POA: Insufficient documentation

## 2016-05-20 DIAGNOSIS — Z9221 Personal history of antineoplastic chemotherapy: Secondary | ICD-10-CM | POA: Diagnosis not present

## 2016-05-20 DIAGNOSIS — C50912 Malignant neoplasm of unspecified site of left female breast: Secondary | ICD-10-CM | POA: Diagnosis present

## 2016-05-20 HISTORY — PX: MASTOPEXY: SHX5358

## 2016-05-20 SURGERY — MASTOPEXY
Anesthesia: General | Site: Breast | Laterality: Bilateral

## 2016-05-20 MED ORDER — PROPOFOL 10 MG/ML IV BOLUS
INTRAVENOUS | Status: DC | PRN
  Administered 2016-05-20: 200 mg via INTRAVENOUS

## 2016-05-20 MED ORDER — KCL IN DEXTROSE-NACL 20-5-0.45 MEQ/L-%-% IV SOLN
INTRAVENOUS | Status: DC
  Administered 2016-05-20: 17:00:00 via INTRAVENOUS
  Filled 2016-05-20: qty 1000

## 2016-05-20 MED ORDER — OXYCODONE HCL 5 MG PO TABS: 5.0000 mg | ORAL_TABLET | ORAL | 0 refills | Status: DC | PRN

## 2016-05-20 MED ORDER — ACETAMINOPHEN 500 MG PO TABS
1000.0000 mg | ORAL_TABLET | ORAL | Status: AC
  Administered 2016-05-20: 1000 mg via ORAL

## 2016-05-20 MED ORDER — FENTANYL CITRATE (PF) 100 MCG/2ML IJ SOLN
INTRAMUSCULAR | Status: AC
  Filled 2016-05-20: qty 2

## 2016-05-20 MED ORDER — CHLORHEXIDINE GLUCONATE CLOTH 2 % EX PADS: 6.0000 | MEDICATED_PAD | Freq: Once | CUTANEOUS | Status: DC

## 2016-05-20 MED ORDER — ONDANSETRON HCL 4 MG/2ML IJ SOLN
INTRAMUSCULAR | Status: DC | PRN
  Administered 2016-05-20: 4 mg via INTRAVENOUS

## 2016-05-20 MED ORDER — MIDAZOLAM HCL 2 MG/2ML IJ SOLN
INTRAMUSCULAR | Status: AC
  Filled 2016-05-20: qty 2

## 2016-05-20 MED ORDER — SCOPOLAMINE 1 MG/3DAYS TD PT72: 1.0000 | MEDICATED_PATCH | Freq: Once | TRANSDERMAL | Status: DC | PRN

## 2016-05-20 MED ORDER — LACTATED RINGERS IV SOLN
INTRAVENOUS | Status: DC
  Administered 2016-05-20 (×2): via INTRAVENOUS
  Administered 2016-05-20: 10 mL/h via INTRAVENOUS

## 2016-05-20 MED ORDER — DEXAMETHASONE SODIUM PHOSPHATE 4 MG/ML IJ SOLN
INTRAMUSCULAR | Status: DC | PRN
  Administered 2016-05-20: 10 mg via INTRAVENOUS

## 2016-05-20 MED ORDER — ZOLPIDEM TARTRATE 5 MG PO TABS: 2.5000 mg | ORAL_TABLET | Freq: Every evening | ORAL | Status: DC | PRN

## 2016-05-20 MED ORDER — SERTRALINE HCL 100 MG PO TABS: 100.0000 mg | ORAL_TABLET | Freq: Every day | ORAL | Status: DC

## 2016-05-20 MED ORDER — ARTIFICIAL TEARS OP OINT
TOPICAL_OINTMENT | OPHTHALMIC | Status: AC
  Filled 2016-05-20: qty 3.5

## 2016-05-20 MED ORDER — SUCCINYLCHOLINE CHLORIDE 200 MG/10ML IV SOSY
PREFILLED_SYRINGE | INTRAVENOUS | Status: AC
  Filled 2016-05-20: qty 20

## 2016-05-20 MED ORDER — LIDOCAINE 2% (20 MG/ML) 5 ML SYRINGE
INTRAMUSCULAR | Status: DC | PRN
  Administered 2016-05-20: 100 mg via INTRAVENOUS

## 2016-05-20 MED ORDER — FENTANYL CITRATE (PF) 100 MCG/2ML IJ SOLN
50.0000 ug | INTRAMUSCULAR | Status: AC | PRN
  Administered 2016-05-20: 50 ug via INTRAVENOUS
  Administered 2016-05-20: 100 ug via INTRAVENOUS
  Administered 2016-05-20 (×3): 50 ug via INTRAVENOUS

## 2016-05-20 MED ORDER — GABAPENTIN 300 MG PO CAPS
ORAL_CAPSULE | ORAL | Status: AC
  Filled 2016-05-20: qty 1

## 2016-05-20 MED ORDER — PHENYLEPHRINE HCL 10 MG/ML IJ SOLN
INTRAMUSCULAR | Status: DC | PRN
Start: 2016-05-20 — End: 2016-05-20
  Administered 2016-05-20: 40 ug via INTRAVENOUS

## 2016-05-20 MED ORDER — METHOCARBAMOL 500 MG PO TABS
500.0000 mg | ORAL_TABLET | Freq: Three times a day (TID) | ORAL | Status: DC | PRN
  Administered 2016-05-20 – 2016-05-21 (×2): 500 mg via ORAL
  Filled 2016-05-20 (×2): qty 1

## 2016-05-20 MED ORDER — HYDROMORPHONE HCL 1 MG/ML IJ SOLN
0.5000 mg | INTRAMUSCULAR | Status: DC | PRN
  Administered 2016-05-20: 1 mg via INTRAVENOUS
  Filled 2016-05-20: qty 1

## 2016-05-20 MED ORDER — ONDANSETRON HCL 4 MG/2ML IJ SOLN: 4.0000 mg | Freq: Four times a day (QID) | INTRAMUSCULAR | Status: DC | PRN

## 2016-05-20 MED ORDER — CELECOXIB 400 MG PO CAPS
400.0000 mg | ORAL_CAPSULE | ORAL | Status: AC
  Administered 2016-05-20: 400 mg via ORAL

## 2016-05-20 MED ORDER — LIDOCAINE 2% (20 MG/ML) 5 ML SYRINGE
INTRAMUSCULAR | Status: AC
  Filled 2016-05-20: qty 5

## 2016-05-20 MED ORDER — HYDROMORPHONE HCL 1 MG/ML IJ SOLN
0.2500 mg | INTRAMUSCULAR | Status: DC | PRN
  Administered 2016-05-20 (×3): 0.5 mg via INTRAVENOUS

## 2016-05-20 MED ORDER — HYDROMORPHONE HCL 1 MG/ML IJ SOLN
INTRAMUSCULAR | Status: AC
  Filled 2016-05-20: qty 1

## 2016-05-20 MED ORDER — CLINDAMYCIN PHOSPHATE 900 MG/50ML IV SOLN
900.0000 mg | INTRAVENOUS | Status: AC
  Administered 2016-05-20: 900 mg via INTRAVENOUS

## 2016-05-20 MED ORDER — CELECOXIB 200 MG PO CAPS
ORAL_CAPSULE | ORAL | Status: AC
  Filled 2016-05-20: qty 2

## 2016-05-20 MED ORDER — METHOCARBAMOL 500 MG PO TABS: 500.0000 mg | ORAL_TABLET | Freq: Three times a day (TID) | ORAL | 0 refills | Status: DC | PRN

## 2016-05-20 MED ORDER — OXYCODONE HCL 5 MG PO TABS
5.0000 mg | ORAL_TABLET | ORAL | Status: DC | PRN
  Administered 2016-05-20 – 2016-05-21 (×4): 10 mg via ORAL
  Filled 2016-05-20 (×4): qty 2

## 2016-05-20 MED ORDER — SODIUM CHLORIDE 0.9 % IJ SOLN
INTRAMUSCULAR | Status: AC
  Filled 2016-05-20: qty 10

## 2016-05-20 MED ORDER — GABAPENTIN 300 MG PO CAPS
300.0000 mg | ORAL_CAPSULE | ORAL | Status: AC
  Administered 2016-05-20: 300 mg via ORAL

## 2016-05-20 MED ORDER — VALACYCLOVIR HCL 500 MG PO TABS: 500.0000 mg | ORAL_TABLET | Freq: Every day | ORAL | Status: DC

## 2016-05-20 MED ORDER — PROMETHAZINE HCL 25 MG/ML IJ SOLN: 6.2500 mg | INTRAMUSCULAR | Status: DC | PRN

## 2016-05-20 MED ORDER — ACETAMINOPHEN 500 MG PO TABS
ORAL_TABLET | ORAL | Status: AC
Start: 2016-05-20 — End: 2016-05-20
  Filled 2016-05-20: qty 2

## 2016-05-20 MED ORDER — BUPIVACAINE LIPOSOME 1.3 % IJ SUSP
INTRAMUSCULAR | Status: DC | PRN
  Administered 2016-05-20: 40 mL

## 2016-05-20 MED ORDER — KETOROLAC TROMETHAMINE 30 MG/ML IJ SOLN
30.0000 mg | Freq: Three times a day (TID) | INTRAMUSCULAR | Status: DC
  Administered 2016-05-20 – 2016-05-21 (×2): 30 mg via INTRAVENOUS
  Filled 2016-05-20: qty 1

## 2016-05-20 MED ORDER — CLINDAMYCIN PHOSPHATE 900 MG/50ML IV SOLN
INTRAVENOUS | Status: AC
  Filled 2016-05-20: qty 50

## 2016-05-20 MED ORDER — SUCCINYLCHOLINE CHLORIDE 20 MG/ML IJ SOLN
INTRAMUSCULAR | Status: DC | PRN
  Administered 2016-05-20: 100 mg via INTRAVENOUS

## 2016-05-20 MED ORDER — 0.9 % SODIUM CHLORIDE (POUR BTL) OPTIME
TOPICAL | Status: DC | PRN
  Administered 2016-05-20: 1000 mL

## 2016-05-20 MED ORDER — MIDAZOLAM HCL 2 MG/2ML IJ SOLN
1.0000 mg | INTRAMUSCULAR | Status: DC | PRN
  Administered 2016-05-20: 2 mg via INTRAVENOUS

## 2016-05-20 MED ORDER — BUPIVACAINE LIPOSOME 1.3 % IJ SUSP
INTRAMUSCULAR | Status: AC
  Filled 2016-05-20: qty 20

## 2016-05-20 MED ORDER — PHENYLEPHRINE 40 MCG/ML (10ML) SYRINGE FOR IV PUSH (FOR BLOOD PRESSURE SUPPORT)
PREFILLED_SYRINGE | INTRAVENOUS | Status: AC
  Filled 2016-05-20: qty 10

## 2016-05-20 MED ORDER — ONDANSETRON 4 MG PO TBDP: 4.0000 mg | ORAL_TABLET | Freq: Four times a day (QID) | ORAL | Status: DC | PRN

## 2016-05-20 SURGICAL SUPPLY — 62 items
APPLIER CLIP 9.375 MED OPEN (MISCELLANEOUS)
BAG DECANTER FOR FLEXI CONT (MISCELLANEOUS) IMPLANT
BINDER BREAST 3XL (BIND) IMPLANT
BINDER BREAST LRG (GAUZE/BANDAGES/DRESSINGS) IMPLANT
BINDER BREAST MEDIUM (GAUZE/BANDAGES/DRESSINGS) IMPLANT
BINDER BREAST XLRG (GAUZE/BANDAGES/DRESSINGS) IMPLANT
BINDER BREAST XXLRG (GAUZE/BANDAGES/DRESSINGS) ×3 IMPLANT
BLADE SURG 10 STRL SS (BLADE) ×12 IMPLANT
BLADE SURG 15 STRL LF DISP TIS (BLADE) IMPLANT
BLADE SURG 15 STRL SS (BLADE)
BNDG GAUZE ELAST 4 BULKY (GAUZE/BANDAGES/DRESSINGS) ×6 IMPLANT
CANISTER SUCT 1200ML W/VALVE (MISCELLANEOUS) ×3 IMPLANT
CHLORAPREP W/TINT 26ML (MISCELLANEOUS) ×6 IMPLANT
CLIP APPLIE 9.375 MED OPEN (MISCELLANEOUS) IMPLANT
COVER MAYO STAND STRL (DRAPES) ×3 IMPLANT
DECANTER SPIKE VIAL GLASS SM (MISCELLANEOUS) IMPLANT
DRAIN CHANNEL 15F RND FF W/TCR (WOUND CARE) ×6 IMPLANT
DRAIN CHANNEL 19F RND (DRAIN) IMPLANT
DRSG PAD ABDOMINAL 8X10 ST (GAUZE/BANDAGES/DRESSINGS) ×6 IMPLANT
DRSG TEGADERM 2-3/8X2-3/4 SM (GAUZE/BANDAGES/DRESSINGS) IMPLANT
ELECT BLADE 4.0 EZ CLEAN MEGAD (MISCELLANEOUS)
ELECT COATED BLADE 2.86 ST (ELECTRODE) ×3 IMPLANT
ELECT REM PT RETURN 9FT ADLT (ELECTROSURGICAL) ×3
ELECTRODE BLDE 4.0 EZ CLN MEGD (MISCELLANEOUS) IMPLANT
ELECTRODE REM PT RTRN 9FT ADLT (ELECTROSURGICAL) ×2 IMPLANT
EVACUATOR SILICONE 100CC (DRAIN) ×6 IMPLANT
GAUZE SPONGE 4X4 12PLY STRL (GAUZE/BANDAGES/DRESSINGS) IMPLANT
GLOVE BIO SURGEON STRL SZ 6 (GLOVE) ×9 IMPLANT
GLOVE BIOGEL PI IND STRL 7.0 (GLOVE) ×4 IMPLANT
GLOVE BIOGEL PI INDICATOR 7.0 (GLOVE) ×2
GLOVE ECLIPSE 6.5 STRL STRAW (GLOVE) ×6 IMPLANT
GOWN STRL REUS W/ TWL LRG LVL3 (GOWN DISPOSABLE) ×4 IMPLANT
GOWN STRL REUS W/TWL LRG LVL3 (GOWN DISPOSABLE) ×2
LIQUID BAND (GAUZE/BANDAGES/DRESSINGS) ×9 IMPLANT
NEEDLE HYPO 18GX1.5 BLUNT FILL (NEEDLE) ×3 IMPLANT
NEEDLE HYPO 25X1 1.5 SAFETY (NEEDLE) ×3 IMPLANT
NS IRRIG 1000ML POUR BTL (IV SOLUTION) ×3 IMPLANT
PACK BASIN DAY SURGERY FS (CUSTOM PROCEDURE TRAY) ×3 IMPLANT
PACK UNIVERSAL I (CUSTOM PROCEDURE TRAY) ×3 IMPLANT
PENCIL BUTTON HOLSTER BLD 10FT (ELECTRODE) ×3 IMPLANT
PIN SAFETY STERILE (MISCELLANEOUS) ×3 IMPLANT
SHEET MEDIUM DRAPE 40X70 STRL (DRAPES) ×3 IMPLANT
SLEEVE SCD COMPRESS KNEE MED (MISCELLANEOUS) ×3 IMPLANT
SPONGE GAUZE 4X4 12PLY STER LF (GAUZE/BANDAGES/DRESSINGS) IMPLANT
SPONGE LAP 18X18 X RAY DECT (DISPOSABLE) ×15 IMPLANT
STAPLER VISISTAT 35W (STAPLE) ×6 IMPLANT
STRIP CLOSURE SKIN 1/2X4 (GAUZE/BANDAGES/DRESSINGS) IMPLANT
SUT ETHILON 2 0 FS 18 (SUTURE) ×6 IMPLANT
SUT MNCRL AB 4-0 PS2 18 (SUTURE) ×9 IMPLANT
SUT VIC AB 3-0 PS1 18 (SUTURE) ×6
SUT VIC AB 3-0 PS1 18XBRD (SUTURE) ×12 IMPLANT
SUT VIC AB 3-0 SH 27 (SUTURE)
SUT VIC AB 3-0 SH 27X BRD (SUTURE) IMPLANT
SUT VICRYL 0 CT-2 (SUTURE) ×3 IMPLANT
SUT VICRYL 4-0 PS2 18IN ABS (SUTURE) ×15 IMPLANT
SYR BULB IRRIGATION 50ML (SYRINGE) ×3 IMPLANT
SYR CONTROL 10ML LL (SYRINGE) ×3 IMPLANT
TAPE MEASURE VINYL STERILE (MISCELLANEOUS) IMPLANT
TOWEL OR 17X24 6PK STRL BLUE (TOWEL DISPOSABLE) ×6 IMPLANT
TUBE CONNECTING 20X1/4 (TUBING) ×3 IMPLANT
UNDERPAD 30X30 (UNDERPADS AND DIAPERS) ×6 IMPLANT
YANKAUER SUCT BULB TIP NO VENT (SUCTIONS) ×3 IMPLANT

## 2016-05-20 NOTE — Op Note (Signed)
Operative Note   DATE OF OPERATION: 11.14.17  LOCATION: [facility]-observation  SURGICAL DIVISION: Plastic Surgery  PREOPERATIVE DIAGNOSES:  1. Left breast ca upper outer quadrant 2. Neoadjuvant chemotherapy  POSTOPERATIVE DIAGNOSES:  same  PROCEDURE:  Bilateral mastopexy  SURGEON: Irene Limbo MD MBA  ASSISTANT: none  ANESTHESIA:  General.   EBL: 78 ml  COMPLICATIONS: None immediate.   INDICATIONS FOR PROCEDURE:  The patient, [name], is a 40 y.o. female born on Apr 27, 1976, is here for oncoplastic reconstruction following left lumpectomy, sentinel node biopsy.    FINDINGS: Left breast 224 g, right breast 41 g tissue resected.  DESCRIPTION OF PROCEDURE:  The patient was marked standing in the preoperative area to mark sternal notch, chest midline, anterior axillary lines, inframammary folds. The location of new nipple areolar complex was marked at level of on iframammary fold on anterior surface breast by palpation. This was marked symmetric over bilateral breasts. With aid of Wise pattern marker, location of new nipple areolar complex and vertical limbs (8 cm) were marked. The patient was taken to the operating room. SCDs were placed and IV antibiotics were given. The patient's operative site was prepped and draped in a sterile fashion. A time out was performed and all information was confirmed to be correct.   Over left breast, superomedial pedicle marked and nipple areolar complex marked with 50 mm diameter marker. Pedicle deepithlialized and developed 5-6 cm thickness and dissected toward chest wall until tension free rotation of pedicle achieved.  Inferior pole breast tissue resected.Medial and lateral flaps developed. Breast tailor tacked closed.The upper outer quadrant lumpectomy cavity had small seroma, was not included in resection.  I then directed attention to right breast where superomedial pedicle designed. The pedicle was deepithelialized and  developed to chest wall. Tissue of superior pole excised to allow for rotation of pedicle. Over inferior pole, the tissue was deepithelialized and preserved as inferiorly based flap.. Patient brought to upright sitting position and assessed for symmetry. Patent returned to supine position, and breast cavities irrigated and hemostasis obtained. 15 Fr JP placed in each breast and secured with 2-0 nylon. Over right breast the inferior based flap was advanced superiorly beneath the pedicle and secured to chest wall with interrupted 0 vicryl suture. Closure completed with 3-0 vicryl to approximate dermis along inframammary fold and vertical limb. NAC inset with 4-0 vicryl in dermis.Skin closure completed with 4-0 monocryl subcuticular throughout.Tissue adhesive applied.  Dry dressing and breast binder applied. The patient was allowed to wake from anesthesia, extubated and taken to the recovery room in satisfactory condition.   SPECIMENS: right and left breast tissue  DRAINS: 15 Fr JP in right and left breast  Irene Limbo, MD [facility] Plastic & Reconstructive Surgery [phone], pin [phone]

## 2016-05-20 NOTE — Anesthesia Procedure Notes (Signed)
Procedure Name: Intubation Date/Time: 05/20/2016 10:54 AM Performed by: Lieutenant Diego Pre-anesthesia Checklist: Patient identified, Emergency Drugs available, Suction available and Patient being monitored Patient Re-evaluated:Patient Re-evaluated prior to inductionOxygen Delivery Method: Circle system utilized Preoxygenation: Pre-oxygenation with 100% oxygen Intubation Type: IV induction Ventilation: Mask ventilation without difficulty Laryngoscope Size: Miller and 2 Grade View: Grade I Tube type: Oral Tube size: 7.0 mm Number of attempts: 1 Airway Equipment and Method: Stylet and Oral airway Placement Confirmation: ETT inserted through vocal cords under direct vision,  positive ETCO2 and breath sounds checked- equal and bilateral Secured at: 23 cm Tube secured with: Tape Dental Injury: Teeth and Oropharynx as per pre-operative assessment

## 2016-05-20 NOTE — Discharge Instructions (Signed)
° ° °  About my Jackson-Pratt Bulb Drain  What is a Jackson-Pratt bulb? A Jackson-Pratt is a soft, round device used to collect drainage. It is connected to a long, thin drainage catheter, which is held in place by one or two small stiches near your surgical incision site. When the bulb is squeezed, it forms a vacuum, forcing the drainage to empty into the bulb.  Emptying the Jackson-Pratt bulb- To empty the bulb: 1. Release the plug on the top of the bulb. 2. Pour the bulb's contents into a measuring container which your nurse will provide. 3. Record the time emptied and amount of drainage. Empty the drain(s) as often as your     doctor or nurse recommends.  Date                  Time                    Amount (Drain 1)                 Amount (Drain 2)  _____________________________________________________________________  _____________________________________________________________________  _____________________________________________________________________  _____________________________________________________________________  _____________________________________________________________________  _____________________________________________________________________  _____________________________________________________________________  _____________________________________________________________________  Squeezing the Jackson-Pratt Bulb- To squeeze the bulb: 1. Make sure the plug at the top of the bulb is open. 2. Squeeze the bulb tightly in your fist. You will hear air squeezing from the bulb. 3. Replace the plug while the bulb is squeezed. 4. Use a safety pin to attach the bulb to your clothing. This will keep the catheter from     pulling at the bulb insertion site.  When to call your doctor- Call your doctor if:  Drain site becomes red, swollen or hot.  You have a fever greater than 101 degrees F.  There is oozing at the drain site.  Drain falls out (apply a  guaze bandage over the drain hole and secure it with tape).  Drainage increases daily not related to activity patterns. (You will usually have more drainage when you are active than when you are resting.)  Drainage has a bad odor.

## 2016-05-20 NOTE — H&P (Signed)
Subjective:     Patient ID: [name] is a 40 y.o. female.  HPI  Here for oncoplastic breast reconstruction. Presented with palpable lump left breast. Underwent MMG followed by [place], originally done at [facility]. Repeated at [facility] and had MRI, latter showing mass measures 2.5 x 2.2 x 1.8 cm in size. NME extends 1.5 cm anteriorly and also 2.0 cm posteriorly. LN benign. Initial biopsy with IDC, ER+/PR-, Her2 on repeat testing positive. Neoadjuvant chemotherapy completed 10.10.17.  Post treatment MRI with resolution of mass, NME present over 4 cm. 1 week post lumpectomy SLN with final pathology no carcinoma identified, 0/3 SLN.  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  Current 12 D  Was living in [place] as husband in law school there, returned to [place] where she is from. Was on office manager previously.     Objective:   Physical Exam  Cardiovascular: Normal rate, regular rhythm and normal heart sounds.   Pulmonary/Chest: Effort normal and breath sounds normal.  Abdominal: Soft.  scar left axilla  Grade 2 ptosis bilateral, left > right, palpable mass UOQ left SN to nipple R 28.5 L 30 cm BW R 20 L 20 cm Nipple to IMF R 10 L 10    Assessment:     Left breast ca UOQ Neoadjuvant chemotherapy S/p lumpectomy, SLN    Plan:     Plan oncoplastic reconstruction with breast reduction possible mastopexy. Reviewed reduction with anchor type scars, drains, post operative visits and limitations, recovery. Diminished sensation nipple and breast skin, risk of nipple loss, wound healing problems, asymmetry. She will require XRT and smaller breast size may aid with this. Reviewed will have some contraction of breast volume and increased firmness with radiation, less ptosis with aging. Discussed changes with wt gain, loss, aging. If she pursues partial mastectomy, highly encourage her to pursue breast reduction prior to XRT as risk of complications post would be very high.  Discussed post procedure limitations, bra. Patient has significant associated pain ovaries, stayed overnight post lumpectomy and may require this today.  Irene Limbo, MD [facility] Plastic & Reconstructive Surgery [phone], pin [phone]

## 2016-05-20 NOTE — Transfer of Care (Signed)
Immediate Anesthesia Transfer of Care Note  Patient: KALYIAH WORMWOOD  Procedure(s) Performed: Procedure(s): MASTOPEXY (Bilateral)  Patient Location: PACU  Anesthesia Type:General  Level of Consciousness: awake and alert   Airway & Oxygen Therapy: Patient Spontanous Breathing and Patient connected to face mask oxygen  Post-op Assessment: Report given to RN and Post -op Vital signs reviewed and stable  Post vital signs: Reviewed and stable  Last Vitals:  Vitals:   05/20/16 1004  BP: 139/79  Pulse: 99  Resp: 18  Temp: 36.7 C    Last Pain:  Vitals:   05/20/16 1004  TempSrc: Oral  PainSc: 5       Patients Stated Pain Goal: 2 ([id number])  Complications: No apparent anesthesia complications

## 2016-05-20 NOTE — Anesthesia Postprocedure Evaluation (Signed)
Anesthesia Post Note  Patient: [name]  Procedure(s) Performed: Procedure(s) (LRB): MASTOPEXY (Bilateral)  Patient location during evaluation: PACU Anesthesia Type: General Level of consciousness: awake, sedated and patient cooperative Pain management: pain level controlled Vital Signs Assessment: post-procedure vital signs reviewed and stable Respiratory status: spontaneous breathing, nonlabored ventilation, respiratory function stable and patient connected to nasal cannula oxygen Cardiovascular status: blood pressure returned to baseline and stable Postop Assessment: no signs of nausea or vomiting Anesthetic complications: no    Last Vitals:  Vitals:   05/20/16 1500 05/20/16 1515  BP: (!) 146/96 (!) 149/101  Pulse: (!) 104 100  Resp: 13 (!) 9  Temp:      Last Pain:  Vitals:   05/20/16 1515  TempSrc:   PainSc: Asleep                 MASSAGEE,JAMES TERRILL

## 2016-05-20 NOTE — Anesthesia Preprocedure Evaluation (Addendum)
Anesthesia Evaluation  Patient identified by MRN, date of birth, ID band Patient awake    Reviewed: Allergy & Precautions, NPO status , Patient's Chart, lab work & pertinent test results  History of Anesthesia Complications (+) PONV  Airway Mallampati: II  TM Distance: >3 FB     Dental   Pulmonary shortness of breath, asthma ,    breath sounds clear to auscultation       Cardiovascular negative cardio ROS   Rhythm:Regular Rate:Normal     Neuro/Psych    GI/Hepatic Neg liver ROS, GERD  ,  Endo/Other  negative endocrine ROS  Renal/GU negative Renal ROS     Musculoskeletal   Abdominal   Peds  Hematology   Anesthesia Other Findings   Reproductive/Obstetrics                            Anesthesia Physical Anesthesia Plan  ASA: III  Anesthesia Plan: General   Post-op Pain Management:    Induction: Intravenous  Airway Management Planned: LMA  Additional Equipment:   Intra-op Plan:   Post-operative Plan: Extubation in OR  Informed Consent: I have reviewed the patients History and Physical, chart, labs and discussed the procedure including the risks, benefits and alternatives for the proposed anesthesia with the patient or authorized representative who has indicated his/her understanding and acceptance.   Dental advisory given  Plan Discussed with: CRNA and Anesthesiologist  Anesthesia Plan Comments:        Anesthesia Quick Evaluation

## 2016-05-21 ENCOUNTER — Other Ambulatory Visit: Payer: Self-pay | Admitting: *Deleted

## 2016-05-21 ENCOUNTER — Encounter (HOSPITAL_BASED_OUTPATIENT_CLINIC_OR_DEPARTMENT_OTHER): Payer: Self-pay | Admitting: Plastic Surgery

## 2016-05-21 ENCOUNTER — Telehealth: Payer: Self-pay | Admitting: *Deleted

## 2016-05-21 DIAGNOSIS — C50219 Malignant neoplasm of upper-inner quadrant of unspecified female breast: Secondary | ICD-10-CM

## 2016-05-21 DIAGNOSIS — Z171 Estrogen receptor negative status [ER-]: Secondary | ICD-10-CM

## 2016-05-21 DIAGNOSIS — Z428 Encounter for other plastic and reconstructive surgery following medical procedure or healed injury: Secondary | ICD-10-CM | POA: Diagnosis not present

## 2016-05-21 MED ORDER — ZOLPIDEM TARTRATE 10 MG PO TABS: ORAL_TABLET | ORAL | 1 refills | Status: DC

## 2016-05-21 MED ORDER — KETOROLAC TROMETHAMINE 30 MG/ML IJ SOLN
INTRAMUSCULAR | Status: AC
  Filled 2016-05-21: qty 1

## 2016-05-21 NOTE — Telephone Encounter (Signed)
Call to patient to follow-up on plan for Lap BSO. Patient states she had surgery yesterday for breast reconstruction yesterday and is home and doing well. Has expected soreness and is resting and using ice packs as instructed.  Was told needs to plan 2-6 week recovery before another surgery so she has decided to wait and plan for January 2018. Dates for January discussed and will plan for 07-21-16 pending MD review and clearance from general and cosmetic surgery.   Routing to provider for final review. Patient agreeable to disposition. Will close encounter.

## 2016-05-21 NOTE — Telephone Encounter (Signed)
Received call @ 930 in regards to pt needing a refill on her AMBIEN 10mg 

## 2016-05-22 ENCOUNTER — Telehealth: Payer: Self-pay | Admitting: Emergency Medicine

## 2016-05-22 NOTE — Telephone Encounter (Signed)
Patient called inquiring about Ambien refill; this nurse called her pharmacy and was notified that Ambien can be filled on 05/26/16 according to her records. Left message on patient's VM advising her of above.

## 2016-05-26 ENCOUNTER — Other Ambulatory Visit: Payer: Self-pay | Admitting: *Deleted

## 2016-05-26 DIAGNOSIS — C50412 Malignant neoplasm of upper-outer quadrant of left female breast: Secondary | ICD-10-CM

## 2016-05-27 ENCOUNTER — Ambulatory Visit (HOSPITAL_BASED_OUTPATIENT_CLINIC_OR_DEPARTMENT_OTHER): Payer: BLUE CROSS/BLUE SHIELD | Admitting: Oncology

## 2016-05-27 ENCOUNTER — Ambulatory Visit (HOSPITAL_BASED_OUTPATIENT_CLINIC_OR_DEPARTMENT_OTHER): Payer: BLUE CROSS/BLUE SHIELD

## 2016-05-27 ENCOUNTER — Other Ambulatory Visit (HOSPITAL_BASED_OUTPATIENT_CLINIC_OR_DEPARTMENT_OTHER): Payer: BLUE CROSS/BLUE SHIELD

## 2016-05-27 VITALS — BP 135/81 | HR 77 | Temp 98.7°F | Resp 18

## 2016-05-27 VITALS — BP 120/69 | HR 87 | Temp 97.9°F | Resp 18 | Ht 64.0 in | Wt 194.9 lb

## 2016-05-27 DIAGNOSIS — Z5112 Encounter for antineoplastic immunotherapy: Secondary | ICD-10-CM

## 2016-05-27 DIAGNOSIS — C50412 Malignant neoplasm of upper-outer quadrant of left female breast: Secondary | ICD-10-CM

## 2016-05-27 DIAGNOSIS — Z170 Estrogen receptor positive status [ER+]: Secondary | ICD-10-CM | POA: Diagnosis not present

## 2016-05-27 LAB — CBC WITH DIFFERENTIAL/PLATELET
BASO%: 0.2 % (ref 0.0–2.0)
Basophils Absolute: 0 10*3/uL (ref 0.0–0.1)
EOS%: 3.3 % (ref 0.0–7.0)
Eosinophils Absolute: 0.2 10*3/uL (ref 0.0–0.5)
HCT: 28 % — ABNORMAL LOW (ref 34.8–46.6)
HGB: 9.6 g/dL — ABNORMAL LOW (ref 11.6–15.9)
LYMPH%: 37.7 % (ref 14.0–49.7)
MCH: 32.5 pg (ref 25.1–34.0)
MCHC: 34.3 g/dL (ref 31.5–36.0)
MCV: 94.9 fL (ref 79.5–101.0)
MONO#: 0.7 10*3/uL (ref 0.1–0.9)
MONO%: 11.4 % (ref 0.0–14.0)
NEUT#: 2.7 10*3/uL (ref 1.5–6.5)
NEUT%: 47.4 % (ref 38.4–76.8)
Platelets: 284 10*3/uL (ref 145–400)
RBC: 2.95 10*6/uL — ABNORMAL LOW (ref 3.70–5.45)
RDW: 14.1 % (ref 11.2–14.5)
WBC: 5.7 10*3/uL (ref 3.9–10.3)
lymph#: 2.1 10*3/uL (ref 0.9–3.3)

## 2016-05-27 LAB — COMPREHENSIVE METABOLIC PANEL
ALT: 14 U/L (ref 0–55)
AST: 17 U/L (ref 5–34)
Albumin: 3.4 g/dL — ABNORMAL LOW (ref 3.5–5.0)
Alkaline Phosphatase: 67 U/L (ref 40–150)
Anion Gap: 10 mEq/L (ref 3–11)
BUN: 18.7 mg/dL (ref 7.0–26.0)
CO2: 23 mEq/L (ref 22–29)
Calcium: 9.5 mg/dL (ref 8.4–10.4)
Chloride: 107 mEq/L (ref 98–109)
Creatinine: 0.9 mg/dL (ref 0.6–1.1)
EGFR: >90 mL/min/{1.73_m2} (ref >90)
Glucose: 96 mg/dl (ref 70–140)
Potassium: 3.6 mEq/L (ref 3.5–5.1)
Sodium: 140 mEq/L (ref 136–145)
Total Bilirubin: 0.29 mg/dL (ref 0.20–1.20)
Total Protein: 7 g/dL (ref 6.4–8.3)

## 2016-05-27 MED ORDER — ACETAMINOPHEN 325 MG PO TABS
ORAL_TABLET | ORAL | Status: AC
  Filled 2016-05-27: qty 2

## 2016-05-27 MED ORDER — HEPARIN SOD (PORK) LOCK FLUSH 100 UNIT/ML IV SOLN
500.0000 [IU] | Freq: Once | INTRAVENOUS | Status: AC | PRN
  Administered 2016-05-27: 500 [IU]
  Filled 2016-05-27: qty 5

## 2016-05-27 MED ORDER — TRASTUZUMAB CHEMO 150 MG IV SOLR
6.0000 mg/kg | Freq: Once | INTRAVENOUS | Status: AC
  Administered 2016-05-27: 504 mg via INTRAVENOUS
  Filled 2016-05-27: qty 24

## 2016-05-27 MED ORDER — ACETAMINOPHEN 325 MG PO TABS
650.0000 mg | ORAL_TABLET | Freq: Once | ORAL | Status: AC
  Administered 2016-05-27: 650 mg via ORAL

## 2016-05-27 MED ORDER — SODIUM CHLORIDE 0.9 % IV SOLN
Freq: Once | INTRAVENOUS | Status: AC
  Administered 2016-05-27: 14:00:00 via INTRAVENOUS

## 2016-05-27 MED ORDER — DIPHENHYDRAMINE HCL 25 MG PO CAPS
25.0000 mg | ORAL_CAPSULE | Freq: Once | ORAL | Status: AC
  Administered 2016-05-27: 25 mg via ORAL

## 2016-05-27 MED ORDER — DIPHENHYDRAMINE HCL 25 MG PO CAPS
ORAL_CAPSULE | ORAL | Status: AC
  Filled 2016-05-27: qty 1

## 2016-05-27 MED ORDER — SODIUM CHLORIDE 0.9% FLUSH
10.0000 mL | INTRAVENOUS | Status: DC | PRN
  Administered 2016-05-27: 10 mL
  Filled 2016-05-27: qty 10

## 2016-05-27 NOTE — Progress Notes (Signed)
[name]  [phone] [phone]     ID: [name] DOB: [birth year]  MR#: [id number]  HMC#:[id number]  Patient Care Team: No Pcp Per Patient as PCP - General (General Practice) Jordan Cruel, MD as Consulting Physician (Oncology) Jordan Bookbinder, MD as Consulting Physician (General Surgery) Jordan Dom, MD as Consulting Physician (Obstetrics and Gynecology) PCP: No PCP Per Patient OTHER MD:  CHIEF COMPLAINT: HER-2 positive, Estrogen receptor moderately positive breast cancer  CURRENT TREATMENT: anti-HER-2 immunotherapy   BREAST CANCER HISTORY: From the original intake note:  [name] woke up the morning of 11/09/2015 with some pain in her left axilla. She examined herself and found a lump in her left breast. She saw her gynecologist the same day, and she confirms a lump. The patient then proceeded directly to mammography. I do not have that report. However it did show the mass. [name] was then scheduled for ultrasound-guided biopsy 11/21/2015. This showed ([place] [phone] at the [facility]) and invasive ductal carcinoma, grade 2 estrogen receptor positive, with moderate intensity (10-50%), progesterone receptor negative, and HER-2 equivocal by immunohistochemistry. Fish was obtained and was equivocal as well, with a signals ratio of 1.8 to, the number per cell being 5.7.  On 12/05/2015 the patient had a CT/ angiogram of the chest for evaluation of her left-sided chest pain. This showed no evidence of a clot. The left breast nodule measured 1.3 cm on the study. There was no enlarged axillary adenopathy. There was also no evidence of lung metastasis or blastic or destructive lytic bone lesions. The upper abdomen was unremarkable.  With that information the patient presents for further evaluation and treatment.  INTERVAL HISTORY: [name] returns today for follow-up of her HER-2/neu positive breast cancer.  Today is her second dose of trastuzumab alone, she will receive every 3 weeks until completing a year total.  Her most recent echo was 03/28/2016 showing an ejection fraction in the 60-65% range.  Since her last visit here [name] had her definitive surgery, namely a left lumpectomy with sentinel lymph node sampling 05/12/2016. The final pathology ([id number]) showed no residual cancer in the breast or in the 3 sentinel lymph nodes. This was followed by bilateral mastopexy 05/20/2016, again with no evidence of malignancy ([id number])   REVIEW OF SYSTEMS: [name] has her drains in place to she expects to get them out later this week. She is taking oxycodone for the pain, and this is causing some constipation. This is improved with MiraLAX. She has mild sinus symptoms, mild dysuria, and some back and joint pain which is not more intense or persistent than before. She has hot flashes. She describes herself as forgetful but not anxious or depressed. A detailed review of systems today was otherwise stable  PAST MEDICAL HISTORY: Past Medical History:  Diagnosis Date  . Allergy   . Anxiety    has panic attacks  . Asthma    childhood, none now  . Breast cancer ([place]) 11/20/15   left breast  . Depression   . Endometriosis   . Family history of breast cancer   . GERD (gastroesophageal reflux disease)   . Herniated disc, cervical   . History of bronchitis as a child   . History of cancer chemotherapy    left breast  . History of stomach ulcers   . History of urinary tract infection   . Hormone disorder   . IBD (inflammatory bowel disease)   .  Migraine without aura   . Neck pain   . PONV (postoperative nausea and vomiting)   . Shortness of breath dyspnea    anxiety related    PAST SURGICAL HISTORY: Past Surgical History:  Procedure Laterality Date  . ABDOMINAL HYSTERECTOMY    . laparoscopically assisted vaginal hysterectomy  01/2006   Still has ovaries  . LAPAROSCOPY     x 2,  diagnosed with endometriosis (prior to hysterectomy), only treated medically.  Marland Kitchen MASTOPEXY Bilateral 05/20/2016   Procedure: MASTOPEXY;  Surgeon: Jordan Limbo, MD;  Location: [facility];  Service: Plastics;  Laterality: Bilateral;  . PORTACATH PLACEMENT Right 12/25/2015   Procedure: INSERTION PORT-A-CATH WITH ULTRA SOUND;  Surgeon: Jordan Bookbinder, MD;  Location: [facility];  Service: General;  Laterality: Right;  . RADIOACTIVE SEED GUIDED MASTECTOMY WITH AXILLARY SENTINEL LYMPH NODE BIOPSY Left 05/12/2016   Procedure: BREAST LUMPECTOMY WITH RADIOACTIVE SEED AND SENTINEL LYMPH NODE BIOPSY AND BLUE DYE INJECTION;  Surgeon: Jordan Bookbinder, MD;  Location: [facility];  Service: General;  Laterality: Left;    FAMILY HISTORY Family History  Problem Relation Age of Onset  . Sarcoidosis Mother   . Hypertension Father   . Throat cancer Maternal Grandfather     smoker and heavy drinker; dx in his late 41s-50s  . Cancer Paternal Grandmother     possible gatric vs bladder cancer  . Bladder Cancer Paternal Grandmother   . Breast cancer Paternal Aunt     dxin her 24s; dad's maternal half sister  . Breast cancer Other     PGFs mother  . Anesthesia problems Neg Hx   . Hypotension Neg Hx   . Malignant hyperthermia Neg Hx   . Pseudochol deficiency Neg Hx   The patient's parents are both living, in their early 46s. The patient has one brother and one sister. On the father's side 1 great grandmother was diagnosed with breast cancer in her 3s. The patient's father's mother was diagnosed with stomach cancer. One of the patient's father's sisters was diagnosed with breast cancer in her 61s. On the maternal side patient's mother's father was diagnosed with throat cancer at age.   GYNECOLOGIC HISTORY:  No LMP recorded. Patient has had a hysterectomy.  menarche age 65, first live birth age 69. The patient is GX P1. She stopped having periods in 2008, when she underwent a simple  hysterectomy without salpingo-oophorectomy. She has a history of endometriosis and was started on Provera in March of this year, with her next dose due next week. (However she has decided to forego further Provera treatments at least for now).   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 He is licensed in [place] but not in [place]. He is now in this area looking for a job. The patient's son [name] is a Ship broker in music at [employer]: Not in place   HEALTH MAINTENANCE: Social History  Substance Use Topics  . Smoking status: Never Smoker  . Smokeless tobacco: Never Used  . Alcohol use 0.0 oz/week     Comment: socially      Colonoscopy:  PAP:  Bone density:  Lipid panel:  Allergies  Allergen Reactions  . Amoxicillin Shortness Of Breath and Itching    Has patient had a PCN reaction causing immediate rash, facial/tongue/throat swelling,  SOB or lightheadedness with hypotension: Yes Has patient had a PCN reaction causing severe rash involving mucus membranes or skin necrosis: No Has patient had a PCN reaction that required hospitalization Yes Has patient had a PCN reaction occurring within the last 10 years: No If all of the above answers are "NO", then may proceed with Cephalosporin use.   . Diflucan [Fluconazole] Nausea Only    heartburn    Current Outpatient Prescriptions  Medication Sig Dispense Refill  . LORazepam (ATIVAN) 0.5 MG tablet TAKE 1 TABLET AT BEDTIME AS NEEDED FOR NAUSEA AND VOMITING 30 tablet 0  . methocarbamol (ROBAXIN) 500 MG tablet Take 1 tablet (500 mg total) by mouth every 8 (eight) hours as needed for muscle spasms. 30 tablet 0  . ondansetron (ZOFRAN) 8 MG tablet Take 1 tablet (8 mg total) by mouth every 8 (eight) hours as needed for nausea or vomiting. 30 tablet 2  .  oxyCODONE (OXY IR/ROXICODONE) 5 MG immediate release tablet Take 1-2 tablets (5-10 mg total) by mouth every 4 (four) hours as needed for moderate pain. 50 tablet 0  . sertraline (ZOLOFT) 100 MG tablet Take 1 tablet (100 mg total) by mouth daily. 30 tablet 3  . traMADol (ULTRAM) 50 MG tablet TAKE 1 TABLET EVERY 6 HOURS 60 tablet 0  . valACYclovir (VALTREX) 500 MG tablet Take 1 tablet (500 mg total) by mouth daily. 60 tablet 1  . zolpidem (AMBIEN) 10 MG tablet Take 1/2 to 1 tab po qhs prn 30 tablet 1   No current facility-administered medications for this visit.     OBJECTIVE: Early middle-aged African-American woman Who appears younger than stated age  77:   05/27/16 1233  BP: 120/69  Pulse: 87  Resp: 18  Temp: 97.9 F (36.6 C)     Body mass index is 33.45 kg/m.    ECOG FS:1 - Symptomatic but completely ambulatory Filed Weights   05/27/16 1233  Weight: 194 lb 14.4 oz (88.4 kg)    Sclerae unicteric, pupils round and equal Oropharynx clear and moist-- no thrush or other lesions No cervical or supraclavicular adenopathy Lungs no rales or rhonchi Heart regular rate and rhythm Abd soft, nontender, positive bowel sounds MSK no focal spinal tenderness, no upper extremity lymphedema Neuro: nonfocal, well oriented, appropriate affect Breasts: Deferred; binder on, drains still in place  LAB RESULTS:  CMP     Component Value Date/Time   NA 140 05/27/2016 1205   K 3.6 05/27/2016 1205   CL 107 12/04/2015 1858   CO2 23 05/27/2016 1205   GLUCOSE 96 05/27/2016 1205   BUN 18.7 05/27/2016 1205   CREATININE 0.9 05/27/2016 1205   CALCIUM 9.5 05/27/2016 1205   PROT 7.0 05/27/2016 1205   ALBUMIN 3.4 (L) 05/27/2016 1205   AST 17 05/27/2016 1205   ALT 14 05/27/2016 1205   ALKPHOS 67 05/27/2016 1205   BILITOT 0.29 05/27/2016 1205   GFRNONAA >60 12/04/2015 1858   GFRAA >60 12/04/2015 1858    INo results found for: SPEP, UPEP  Lab Results  Component Value Date   WBC 5.7  05/27/2016   NEUTROABS 2.7 05/27/2016   HGB 9.6 (L) 05/27/2016   HCT 28.0 (L) 05/27/2016   MCV 94.9 05/27/2016   PLT 284 05/27/2016      Chemistry      Component Value Date/Time   NA 140 05/27/2016 1205   K 3.6 05/27/2016 1205   CL 107 12/04/2015 1858   CO2 23 05/27/2016 1205   BUN  18.7 05/27/2016 1205   CREATININE 0.9 05/27/2016 1205      Component Value Date/Time   CALCIUM 9.5 05/27/2016 1205   ALKPHOS 67 05/27/2016 1205   AST 17 05/27/2016 1205   ALT 14 05/27/2016 1205   BILITOT 0.29 05/27/2016 1205       No results found for: LABCA2  No components found for: LABCA125  No results for input(s): INR in the last 168 hours.  Urinalysis    Component Value Date/Time   COLORURINE STRAW (A) 12/11/2008 1923   APPEARANCEUR CLEAR 12/11/2008 1923   LABSPEC 1.020 12/11/2008 1923   PHURINE 6.0 12/11/2008 1923   GLUCOSEU NEGATIVE 12/11/2008 1923   HGBUR NEGATIVE 12/11/2008 1923   BILIRUBINUR NEGATIVE 12/11/2008 1923   KETONESUR NEGATIVE 12/11/2008 1923   PROTEINUR NEGATIVE 12/11/2008 1923   UROBILINOGEN 0.2 12/11/2008 1923   NITRITE NEGATIVE 12/11/2008 1923   LEUKOCYTESUR  12/11/2008 1923    NEGATIVE MICROSCOPIC NOT DONE ON URINES WITH NEGATIVE PROTEIN, BLOOD, LEUKOCYTES, NITRITE, OR GLUCOSE <1000 mg/dL.      ELIGIBLE FOR AVAILABLE RESEARCH PROTOCOL: no  STUDIES: Mm Breast Surgical Specimen  Result Date: 05/12/2016 CLINICAL DATA:  Post surgical removal of left breast lesion. EXAM: SPECIMEN RADIOGRAPH OF THE LEFT BREAST COMPARISON:  Previous exam(s). FINDINGS: Status post excision of the left breast. The radioactive seed and biopsy marker clip are present, completely intact, and were marked for pathology. IMPRESSION: Specimen radiograph of the left breast. Electronically Signed   By: Altamese Cabal M.D.   On: 05/12/2016 12:36   Mm Lt Radioactive Seed Loc Mammo Guide  Result Date: 05/08/2016 CLINICAL DATA:  Radioactive seed localization prior to surgery EXAM:  MAMMOGRAPHIC GUIDED RADIOACTIVE SEED LOCALIZATION OF THE LEFT BREAST COMPARISON:  Previous exam(s). FINDINGS: Patient presents for radioactive seed localization prior to surgery. I met with the patient and we discussed the procedure of seed localization including benefits and alternatives. We discussed the high likelihood of a successful procedure. We discussed the risks of the procedure including infection, bleeding, tissue injury and further surgery. We discussed the low dose of radioactivity involved in the procedure. Informed, written consent was given. The usual time-out protocol was performed immediately prior to the procedure. Using mammographic guidance, sterile technique, 1% lidocaine and an I-125 radioactive seed, the biopsy clip was localized using a lateral approach. The follow-up mammogram images confirm the seed in the expected location and were marked for the referring surgeon. Follow-up survey of the patient confirms presence of the radioactive seed. Order number of I-125 seed:  [id number]. Total activity:  4.801 millicuries  Reference Date: April 18, 2016 The patient tolerated the procedure well and was released from the [place] [facility]. She was given instructions regarding seed removal. IMPRESSION: Radioactive seed localization left breast. No apparent complications. Electronically Signed   By: Dorise Bullion III M.D   On: 05/08/2016 15:04    ASSESSMENT: 40 y.o. [place] woman status post left breast upper outer quadrant biopsy 11/20/2015 for a clinical T1c N0, stage IA invasive ductal carcinoma, grade 2, estrogen receptor moderately positive, progesterone receptor negative, HER-2 equivocal by both immunohistochemistry and FISH  (1) neoadjuvant tamoxifen started 12/11/2015, stopped at the start of chemotherapy  (2) genetics testing 01/03/2016 through the Breast/Ovarian gene panel offered by [facility] found no deleterious mutations in ATM, BARD1, BRCA1, BRCA2, BRIP1, CDH1, CHEK2, EPCAM, FANCC,  MLH1, MSH2, MSH6, NBN, PALB2, PMS2, PTEN, RAD51C, RAD51D, TP53, and XRCC2  (3) neoadjuvant chemotherapy consisting of carboplatin, docetaxel, trastuzumab and pertuzumab every 21 days 6 starting  01/01/2016, completed 04/15/2016  (4) trastuzumab to be continued to complete a year (through June 2018)  (a echocardiogram 03/28/2016) Shows an ejection fraction of 60-65%.   (5) left lumpectomy and sentinel lymph node sampling 05/12/2016 showed a complete pathologic response (ypT0, ypN0)  (a) Status post bilateral mastopexy 05/20/2016  (6) adjuvant radiation to follow surgery   (7) anti-estrogens to be continued 5-10 years, more likely the latter    PLAN: [name] had the best possible response to her neoadjuvant chemotherapy. She understands that patients who achieve a complete pathologic response, as she has done, have a good long-term prognosis.  Once she recovers from her surgery she will start her adjuvant radiation, and we have requested an appointment with Dr. Isidore Moos in the near future to start planning that. Note that the patient's husband, who is still working temporarily in [place], is planning to return to [place] and she likely will follow him there once she completes her radiation treatments.  [name] tolerated tamoxifen well for the brief period that she took it before starting chemotherapy. She will return to that once she completes her radiation treatments. I will recommend that she continue on that drug, or that followed by an aromatase inhibitor, for a total of 10 years.  She will have her next echocardiogram mid December, and we will continue the trastuzumab every 21 days through June 2018  She knows to call for any problems that may develop before her next visit here.   Jordan Cruel, MD   05/31/2016 5:28 PM Medical Oncology and Hematology [facility] [address] [place], [place] [address] Tel. [phone]    Fax. [phone]

## 2016-05-27 NOTE — Patient Instructions (Signed)
[facility] Discharge Instructions for Patients Receiving Chemotherapy  Today you received the following chemotherapy agents:  Herceptin  To help prevent nausea and vomiting after your treatment, we encourage you to take your nausea medication as directed.   If you develop nausea and vomiting that is not controlled by your nausea medication, call the clinic.   BELOW ARE SYMPTOMS THAT SHOULD BE REPORTED IMMEDIATELY:  *FEVER GREATER THAN 100.5 F  *CHILLS WITH OR WITHOUT FEVER  NAUSEA AND VOMITING THAT IS NOT CONTROLLED WITH YOUR NAUSEA MEDICATION  *UNUSUAL SHORTNESS OF BREATH  *UNUSUAL BRUISING OR BLEEDING  TENDERNESS IN MOUTH AND THROAT WITH OR WITHOUT PRESENCE OF ULCERS  *URINARY PROBLEMS  *BOWEL PROBLEMS  UNUSUAL RASH Items with * indicate a potential emergency and should be followed up as soon as possible.  Feel free to call the clinic you have any questions or concerns. The clinic phone number is [phone].  Please show the [facility] at check-in to the Emergency Department and triage nurse.  Trastuzumab injection for infusion What is this medicine? TRASTUZUMAB (tras TOO zoo mab) is a monoclonal antibody. It is used to treat breast cancer and stomach cancer. This medicine may be used for other purposes; ask your health care provider or pharmacist if you have questions. What should I tell my health care provider before I take this medicine? They need to know if you have any of these conditions: -heart disease -heart failure -infection (especially a virus infection such as chickenpox, cold sores, or herpes) -lung or breathing disease, like asthma -recent or ongoing radiation therapy -an unusual or allergic reaction to trastuzumab, benzyl alcohol, or other medications, foods, dyes, or preservatives -pregnant or trying to get pregnant -breast-feeding How should I use this medicine? This drug is given as an infusion into a vein. It is  administered in a hospital or clinic by a specially trained health care professional. Talk to your pediatrician regarding the use of this medicine in children. This medicine is not approved for use in children. Overdosage: If you think you have taken too much of this medicine contact a poison control center or emergency room at once. NOTE: This medicine is only for you. Do not share this medicine with others. What if I miss a dose? It is important not to miss a dose. Call your doctor or health care professional if you are unable to keep an appointment. What may interact with this medicine? -doxorubicin -warfarin This list may not describe all possible interactions. Give your health care provider a list of all the medicines, herbs, non-prescription drugs, or dietary supplements you use. Also tell them if you smoke, drink alcohol, or use illegal drugs. Some items may interact with your medicine. What should I watch for while using this medicine? Visit your doctor for checks on your progress. Report any side effects. Continue your course of treatment even though you feel ill unless your doctor tells you to stop. Call your doctor or health care professional for advice if you get a fever, chills or sore throat, or other symptoms of a cold or flu. Do not treat yourself. Try to avoid being around people who are sick. You may experience fever, chills and shaking during your first infusion. These effects are usually mild and can be treated with other medicines. Report any side effects during the infusion to your health care professional. Fever and chills usually do not happen with later infusions. Do not become pregnant while taking this medicine or  for 7 months after stopping it. Women should inform their doctor if they wish to become pregnant or think they might be pregnant. Women of child-bearing potential will need to have a negative pregnancy test before starting this medicine. There is a potential for  serious side effects to an unborn child. Talk to your health care professional or pharmacist for more information. Do not breast-feed an infant while taking this medicine or for 7 months after stopping it. Women must use effective birth control with this medicine. What side effects may I notice from receiving this medicine? Side effects that you should report to your doctor or other health care professional as soon as possible: -breathing difficulties -chest pain or palpitations -cough -dizziness or fainting -fever or chills, sore throat -skin rash, itching or hives -swelling of the legs or ankles -unusually weak or tired Side effects that usually do not require medical attention (report to your doctor or other health care professional if they continue or are bothersome): -loss of appetite -headache -muscle aches -nausea This list may not describe all possible side effects. Call your doctor for medical advice about side effects. You may report side effects to FDA at 1-800-FDA-1088. Where should I keep my medicine? This drug is given in a hospital or clinic and will not be stored at home. NOTE: This sheet is a summary. It may not cover all possible information. If you have questions about this medicine, talk to your doctor, pharmacist, or health care provider.    2016, Elsevier/Gold Standard. (2014-09-29 11:49:32)

## 2016-06-05 ENCOUNTER — Other Ambulatory Visit: Payer: Self-pay | Admitting: *Deleted

## 2016-06-05 MED ORDER — SERTRALINE HCL 100 MG PO TABS: 100.0000 mg | ORAL_TABLET | Freq: Every day | ORAL | 3 refills | Status: DC

## 2016-06-16 ENCOUNTER — Other Ambulatory Visit: Payer: Self-pay

## 2016-06-16 DIAGNOSIS — C50412 Malignant neoplasm of upper-outer quadrant of left female breast: Secondary | ICD-10-CM

## 2016-06-16 DIAGNOSIS — Z170 Estrogen receptor positive status [ER+]: Secondary | ICD-10-CM

## 2016-06-17 ENCOUNTER — Telehealth: Payer: Self-pay

## 2016-06-17 ENCOUNTER — Ambulatory Visit: Payer: BLUE CROSS/BLUE SHIELD

## 2016-06-17 ENCOUNTER — Other Ambulatory Visit: Payer: BLUE CROSS/BLUE SHIELD

## 2016-06-17 NOTE — Telephone Encounter (Signed)
Pt not feeling well today, pain in joints L shoulder, bilat knees, lower back, worse in morning, moves slow. This am took pain pill and muscle relaxer to get going. She tried calling 0830 about her herceptin appt today. She missed her infusion. She is asking to r/s to Friday or next week d/t transportation. She is every 21 days.

## 2016-06-18 ENCOUNTER — Telehealth: Payer: Self-pay | Admitting: *Deleted

## 2016-06-18 NOTE — Telephone Encounter (Signed)
LMOVM - Appts made to reschedule missed infusion appointment.  Friday 15th 315 lab and 245 infusion.  Pt to call clinic to confirm receipt of appointment info.

## 2016-06-18 NOTE — Telephone Encounter (Signed)
appt added per staff message,. Scheduler notified

## 2016-06-19 ENCOUNTER — Encounter: Payer: Self-pay | Admitting: Podiatry

## 2016-06-19 ENCOUNTER — Ambulatory Visit (INDEPENDENT_AMBULATORY_CARE_PROVIDER_SITE_OTHER): Payer: BLUE CROSS/BLUE SHIELD | Admitting: Podiatry

## 2016-06-19 VITALS — Ht 64.0 in | Wt 194.0 lb

## 2016-06-19 DIAGNOSIS — L600 Ingrowing nail: Secondary | ICD-10-CM

## 2016-06-19 DIAGNOSIS — M79672 Pain in left foot: Secondary | ICD-10-CM

## 2016-06-19 NOTE — Progress Notes (Signed)
SUBJECTIVE: 40 y.o. year old female presents follow up on toe nail problem. Left great toe hurts at medial border. She was seen in 03/20/16 for problematic nails and Tinea pedis on both feet.  HPI: Her last Chemotherapy was 4 weeks ago for Breast Cancer. Diagnosed with breast cancer since May 2017.   OBJECTIVE: DERMATOLOGIC EXAMINATION: Painful ingrown nail left great toe medial border. Dystrophic right hallucal nail at distal 1/2.  VASCULAR EXAMINATION OF LOWER LIMBS: Pedal pulses: All pedal pulses are palpable with normal pulsation.  Temperature gradient from tibial crest to dorsum of foot is within normal bilateral.  NEUROLOGIC EXAMINATION OF THE LOWER LIMBS: All epicritic and tactile sensations grossly intact.  MUSCULOSKELETAL EXAMINATION: Rectus foot without gross deformities.  ASSESSMENT: Onychomycosis both great toe. Ingrown nail left great toe medial border. Resolved Tinea pedis.  PLAN: Reviewed clinical findings and available treatment options. Both big toe nails debrided. Ingrown nail at medial border resected and Betadine dressing applied left great toe. Reviewed need for permanent procedure on left great toe when problem recur.

## 2016-06-19 NOTE — Patient Instructions (Signed)
Seen for problem toe nails. Left ingrown nail resected. Betadine dressing applied. May need permanent procedure on left great toe medial border. Return as needed.

## 2016-06-20 ENCOUNTER — Ambulatory Visit (HOSPITAL_BASED_OUTPATIENT_CLINIC_OR_DEPARTMENT_OTHER): Payer: BLUE CROSS/BLUE SHIELD

## 2016-06-20 ENCOUNTER — Other Ambulatory Visit: Payer: Self-pay | Admitting: *Deleted

## 2016-06-20 ENCOUNTER — Other Ambulatory Visit (HOSPITAL_BASED_OUTPATIENT_CLINIC_OR_DEPARTMENT_OTHER): Payer: BLUE CROSS/BLUE SHIELD

## 2016-06-20 ENCOUNTER — Telehealth: Payer: Self-pay | Admitting: *Deleted

## 2016-06-20 VITALS — BP 128/78 | HR 76 | Temp 97.3°F | Resp 16

## 2016-06-20 DIAGNOSIS — C50412 Malignant neoplasm of upper-outer quadrant of left female breast: Secondary | ICD-10-CM | POA: Diagnosis not present

## 2016-06-20 DIAGNOSIS — Z5112 Encounter for antineoplastic immunotherapy: Secondary | ICD-10-CM

## 2016-06-20 DIAGNOSIS — Z170 Estrogen receptor positive status [ER+]: Secondary | ICD-10-CM

## 2016-06-20 LAB — COMPREHENSIVE METABOLIC PANEL
ALT: 21 U/L (ref 0–55)
AST: 23 U/L (ref 5–34)
Albumin: 3.7 g/dL (ref 3.5–5.0)
Alkaline Phosphatase: 83 U/L (ref 40–150)
Anion Gap: 8 mEq/L (ref 3–11)
BUN: 10.2 mg/dL (ref 7.0–26.0)
CO2: 26 mEq/L (ref 22–29)
Calcium: 9.5 mg/dL (ref 8.4–10.4)
Chloride: 106 mEq/L (ref 98–109)
Creatinine: 0.8 mg/dL (ref 0.6–1.1)
EGFR: >90 mL/min/{1.73_m2} (ref >90)
Glucose: 90 mg/dl (ref 70–140)
Potassium: 3.8 mEq/L (ref 3.5–5.1)
Sodium: 140 mEq/L (ref 136–145)
Total Bilirubin: 0.27 mg/dL (ref 0.20–1.20)
Total Protein: 7.7 g/dL (ref 6.4–8.3)

## 2016-06-20 LAB — CBC WITH DIFFERENTIAL/PLATELET
BASO%: 0.2 % (ref 0.0–2.0)
Basophils Absolute: 0 10*3/uL (ref 0.0–0.1)
EOS%: 5.3 % (ref 0.0–7.0)
Eosinophils Absolute: 0.4 10*3/uL (ref 0.0–0.5)
HCT: 32.6 % — ABNORMAL LOW (ref 34.8–46.6)
HGB: 11.2 g/dL — ABNORMAL LOW (ref 11.6–15.9)
LYMPH%: 33.2 % (ref 14.0–49.7)
MCH: 31.8 pg (ref 25.1–34.0)
MCHC: 34.4 g/dL (ref 31.5–36.0)
MCV: 92.6 fL (ref 79.5–101.0)
MONO#: 0.7 10*3/uL (ref 0.1–0.9)
MONO%: 9.8 % (ref 0.0–14.0)
NEUT#: 3.4 10*3/uL (ref 1.5–6.5)
NEUT%: 51.5 % (ref 38.4–76.8)
Platelets: 238 10*3/uL (ref 145–400)
RBC: 3.52 10*6/uL — ABNORMAL LOW (ref 3.70–5.45)
RDW: 12.9 % (ref 11.2–14.5)
WBC: 6.7 10*3/uL (ref 3.9–10.3)
lymph#: 2.2 10*3/uL (ref 0.9–3.3)

## 2016-06-20 MED ORDER — SODIUM CHLORIDE 0.9% FLUSH
10.0000 mL | INTRAVENOUS | Status: DC | PRN
  Administered 2016-06-20: 10 mL
  Filled 2016-06-20: qty 10

## 2016-06-20 MED ORDER — DIPHENHYDRAMINE HCL 25 MG PO CAPS
25.0000 mg | ORAL_CAPSULE | Freq: Once | ORAL | Status: AC
  Administered 2016-06-20: 25 mg via ORAL

## 2016-06-20 MED ORDER — DIPHENHYDRAMINE HCL 25 MG PO CAPS
ORAL_CAPSULE | ORAL | Status: AC
  Filled 2016-06-20: qty 1

## 2016-06-20 MED ORDER — SODIUM CHLORIDE 0.9 % IV SOLN
Freq: Once | INTRAVENOUS | Status: AC
  Administered 2016-06-20: 16:00:00 via INTRAVENOUS

## 2016-06-20 MED ORDER — SERTRALINE HCL 100 MG PO TABS: 100.0000 mg | ORAL_TABLET | Freq: Every day | ORAL | 3 refills | Status: DC

## 2016-06-20 MED ORDER — ACETAMINOPHEN 325 MG PO TABS
650.0000 mg | ORAL_TABLET | Freq: Once | ORAL | Status: AC
  Administered 2016-06-20: 650 mg via ORAL

## 2016-06-20 MED ORDER — ACETAMINOPHEN 325 MG PO TABS
ORAL_TABLET | ORAL | Status: AC
  Filled 2016-06-20: qty 2

## 2016-06-20 MED ORDER — HEPARIN SOD (PORK) LOCK FLUSH 100 UNIT/ML IV SOLN
500.0000 [IU] | Freq: Once | INTRAVENOUS | Status: AC | PRN
  Administered 2016-06-20: 500 [IU]
  Filled 2016-06-20: qty 5

## 2016-06-20 MED ORDER — TRASTUZUMAB CHEMO 150 MG IV SOLR
6.0000 mg/kg | Freq: Once | INTRAVENOUS | Status: AC
  Administered 2016-06-20: 504 mg via INTRAVENOUS
  Filled 2016-06-20: qty 24

## 2016-06-20 NOTE — Telephone Encounter (Signed)
Call to patient to confirm and complete plans for upcoming surgery scheduled for 07-21-16. Left message to call back.

## 2016-06-20 NOTE — Patient Instructions (Signed)
[facility] Discharge Instructions for Patients Receiving Chemotherapy  Today you received the following chemotherapy agents Herceptin  To help prevent nausea and vomiting after your treatment, we encourage you to take your nausea medication    If you develop nausea and vomiting that is not controlled by your nausea medication, call the clinic.   BELOW ARE SYMPTOMS THAT SHOULD BE REPORTED IMMEDIATELY:  *FEVER GREATER THAN 100.5 F  *CHILLS WITH OR WITHOUT FEVER  NAUSEA AND VOMITING THAT IS NOT CONTROLLED WITH YOUR NAUSEA MEDICATION  *UNUSUAL SHORTNESS OF BREATH  *UNUSUAL BRUISING OR BLEEDING  TENDERNESS IN MOUTH AND THROAT WITH OR WITHOUT PRESENCE OF ULCERS  *URINARY PROBLEMS  *BOWEL PROBLEMS  UNUSUAL RASH Items with * indicate a potential emergency and should be followed up as soon as possible.  Feel free to call the clinic you have any questions or concerns. The clinic phone number is [phone].  Please show the CHEMO ALERT CARD at check-in to the Emergency Department and triage nurse.   

## 2016-06-23 ENCOUNTER — Ambulatory Visit (HOSPITAL_COMMUNITY)
Admission: RE | Admit: 2016-06-23 | Discharge: 2016-06-23 | Disposition: A | Discharge status: Home / Self Care | Payer: BLUE CROSS/BLUE SHIELD | Source: Ambulatory Visit | Attending: Cardiology | Admitting: Cardiology

## 2016-06-23 ENCOUNTER — Encounter (HOSPITAL_COMMUNITY): Payer: BLUE CROSS/BLUE SHIELD

## 2016-06-23 DIAGNOSIS — C50412 Malignant neoplasm of upper-outer quadrant of left female breast: Secondary | ICD-10-CM

## 2016-06-25 NOTE — Telephone Encounter (Signed)
Call to patient. States she is ready to proceed with surgery on 07-21-16. Advised need surgery consult here is office with Dr Talbert Nan.  Patient states she is scheduled for radiation oncology consult on 07-09-16. Advised will need to check with oncologist regarding surgery date and recovery time with regard to any impact on radiation therapy.  Will check with Dr Jana Hakim and call her back.    Routing to Dr Talbert Nan. Please advise on instructions for surgical clearance.

## 2016-06-27 ENCOUNTER — Telehealth: Payer: Self-pay | Admitting: *Deleted

## 2016-06-27 ENCOUNTER — Ambulatory Visit: Payer: BLUE CROSS/BLUE SHIELD

## 2016-06-27 ENCOUNTER — Encounter: Payer: BLUE CROSS/BLUE SHIELD | Admitting: Nurse Practitioner

## 2016-06-27 DIAGNOSIS — R42 Dizziness and giddiness: Secondary | ICD-10-CM

## 2016-06-27 DIAGNOSIS — Z170 Estrogen receptor positive status [ER+]: Secondary | ICD-10-CM

## 2016-06-27 DIAGNOSIS — C50412 Malignant neoplasm of upper-outer quadrant of left female breast: Secondary | ICD-10-CM

## 2016-06-27 NOTE — Telephone Encounter (Signed)
VM left by pt's mother stating upon arrival to home and pt not able to stay sitting up for extended periods without symptoms - she contacted EMS.  " they checked her out and did vitals and a EKG - "    " they said it may just be secondary to a medication but nothing was wrong "  " we are going to stay here and she is going to rest and see how she feels "  No other needs. Appointments made for [facility] cancelled.  See prior nursing note

## 2016-06-27 NOTE — Telephone Encounter (Signed)
Dr Talbert Nan has contacted Dr Jana Hakim and Dr Lisbeth Renshaw regarding surgical plan. Will await their response before making additional plans.  Encounter closed.

## 2016-06-27 NOTE — Telephone Encounter (Signed)
This RN spoke with pt's mother- Jeanett Schlein per her call inquiring what to do per Jackie's c/o " whenever I get up I feel lightheaded and like a fluttering of my heart " -  " she then feels kinda SOB "  No  Chest pain or nausea.  Pt is able to get up and go to the bathroom ( no noted ambulation issues ) . Symptoms resolve with reclining.  Jeanett Schlein is able to bring pt in to [facility]  - she understands if when she goes to pt's home and she does not feel safe with pt's condition she should call 911.  URGENT POF sent to scheduling -CBC/DIFF, CMET, TXX AND EXTRA TUBE order placed per above.

## 2016-07-02 ENCOUNTER — Ambulatory Visit: Payer: BLUE CROSS/BLUE SHIELD | Admitting: Physical Therapy

## 2016-07-04 NOTE — Progress Notes (Signed)
Follow UP NEW CONSULT LEFT BREAST    Diagnosis 05/12/2016: Dr. Darleen Crocker: 1. Breast, lumpectomy, Left - FIBROCYSTIC CHANGES WITH ADENOSIS. - CHANGES CONSISTENT WITH CHEMOTHERAPY TREATMENT. - THERE IS NO EVIDENCE OF MALIGNANCY. - SEE ONCOLOGY TABLE BELOW. 2. Lymph node, sentinel, biopsy, Left - THERE IS NO EVIDENCE OF CARCINOMA IN 1 OF 1 LYMPH NODE (0/1). 3. Lymph node, sentinel, biopsy, Left - THERE IS NO EVIDENCE OF CARCINOMA IN 1 OF 1 LYMPH NODE (0/1). 4. Lymph node, sentinel, biopsy, Left - THERE IS NO EVIDENCE OF CARCINOMA IN 1 OF 1 LYMPH NODE (0/1). Microscopic Comment 1. CARCINOMA OF THE BREAST, STATUS POST NEOADJUVANT THERAPY     Diagnosis 05/20/16: 1. Breast, Mammoplasty, Left - BENIGN BREAST TISSUE. - NO EVIDENCE OF MALIGNANCY. 2. Breast, Mammoplasty, Right - BENIGN BREAST TISSUE. - NO EVIDENCE OF MALIGNANCY  05/27/16: Dr. Jana Hakim note: (3) neoadjuvant chemotherapy consisting of carboplatin, docetaxel, trastuzumab and pertuzumab every 21 days 6 starting 01/01/2016, completed 04/15/2016  (4) trastuzumab to be continued to complete a year (through June 2018)             (a echocardiogram 03/28/2016) Shows an ejection fraction of 60-65%.   (5) left lumpectomy and sentinel lymph node sampling 05/12/2016 showed a complete pathologic response (ypT0, ypN0)             (a) Status post bilateral mastopexy 05/20/2016  (6) adjuvant radiation to follow surgery   (7) anti-estrogens to be continued 5-10 years, more likely the latter     07/21/2016 Scheduled   B/L Laparoscopic Salpingo Oophorectomy  With Dr. Fuller Song, MD, patient has decided to hold off until after radiation is completed, stated today 07/09/16 BP 124/76 (BP Location: Right Arm, Patient Position: Sitting, Cuff Size: Normal)   Pulse 80   Temp 98.2 F (36.8 C) (Oral)   Resp 16   Ht 5\' 4"  (1.626 m)   Wt 198 lb (89.8 kg)   BMI 33.99 kg/m   Wt Readings from Last 3 Encounters:  07/09/16 198 lb  (89.8 kg)  06/19/16 194 lb (88 kg)  05/27/16 194 lb 14.4 oz (88.4 kg)

## 2016-07-08 ENCOUNTER — Other Ambulatory Visit (HOSPITAL_BASED_OUTPATIENT_CLINIC_OR_DEPARTMENT_OTHER): Payer: BLUE CROSS/BLUE SHIELD

## 2016-07-08 ENCOUNTER — Ambulatory Visit (HOSPITAL_BASED_OUTPATIENT_CLINIC_OR_DEPARTMENT_OTHER): Payer: BLUE CROSS/BLUE SHIELD

## 2016-07-08 ENCOUNTER — Telehealth: Payer: Self-pay | Admitting: *Deleted

## 2016-07-08 ENCOUNTER — Other Ambulatory Visit: Payer: Self-pay | Admitting: Oncology

## 2016-07-08 VITALS — BP 118/85 | HR 74 | Temp 98.7°F | Resp 16

## 2016-07-08 DIAGNOSIS — C50412 Malignant neoplasm of upper-outer quadrant of left female breast: Secondary | ICD-10-CM

## 2016-07-08 DIAGNOSIS — Z5112 Encounter for antineoplastic immunotherapy: Secondary | ICD-10-CM | POA: Diagnosis not present

## 2016-07-08 DIAGNOSIS — R42 Dizziness and giddiness: Secondary | ICD-10-CM

## 2016-07-08 DIAGNOSIS — Z170 Estrogen receptor positive status [ER+]: Secondary | ICD-10-CM

## 2016-07-08 LAB — COMPREHENSIVE METABOLIC PANEL
ALT: 18 U/L (ref 0–55)
AST: 19 U/L (ref 5–34)
Albumin: 3.5 g/dL (ref 3.5–5.0)
Alkaline Phosphatase: 81 U/L (ref 40–150)
Anion Gap: 9 mEq/L (ref 3–11)
BUN: 18 mg/dL (ref 7.0–26.0)
CO2: 24 mEq/L (ref 22–29)
Calcium: 9.2 mg/dL (ref 8.4–10.4)
Chloride: 104 mEq/L (ref 98–109)
Creatinine: 0.8 mg/dL (ref 0.6–1.1)
EGFR: >90 mL/min/{1.73_m2} (ref >90)
Glucose: 104 mg/dl (ref 70–140)
Potassium: 3.6 mEq/L (ref 3.5–5.1)
Sodium: 138 mEq/L (ref 136–145)
Total Bilirubin: 0.27 mg/dL (ref 0.20–1.20)
Total Protein: 7.1 g/dL (ref 6.4–8.3)

## 2016-07-08 LAB — CBC WITH DIFFERENTIAL/PLATELET
BASO%: 0.4 % (ref 0.0–2.0)
Basophils Absolute: 0 10*3/uL (ref 0.0–0.1)
EOS%: 5.1 % (ref 0.0–7.0)
Eosinophils Absolute: 0.3 10*3/uL (ref 0.0–0.5)
HCT: 31.2 % — ABNORMAL LOW (ref 34.8–46.6)
HGB: 10.7 g/dL — ABNORMAL LOW (ref 11.6–15.9)
LYMPH%: 28.8 % (ref 14.0–49.7)
MCH: 31.9 pg (ref 25.1–34.0)
MCHC: 34.2 g/dL (ref 31.5–36.0)
MCV: 93.2 fL (ref 79.5–101.0)
MONO#: 0.5 10*3/uL (ref 0.1–0.9)
MONO%: 7.9 % (ref 0.0–14.0)
NEUT#: 3.6 10*3/uL (ref 1.5–6.5)
NEUT%: 57.8 % (ref 38.4–76.8)
Platelets: 220 10*3/uL (ref 145–400)
RBC: 3.35 10*6/uL — ABNORMAL LOW (ref 3.70–5.45)
RDW: 13.3 % (ref 11.2–14.5)
WBC: 6.2 10*3/uL (ref 3.9–10.3)
lymph#: 1.8 10*3/uL (ref 0.9–3.3)

## 2016-07-08 LAB — DRAW EXTRA CLOT TUBE

## 2016-07-08 MED ORDER — ACETAMINOPHEN 325 MG PO TABS
650.0000 mg | ORAL_TABLET | Freq: Once | ORAL | Status: AC
  Administered 2016-07-08: 650 mg via ORAL

## 2016-07-08 MED ORDER — SODIUM CHLORIDE 0.9% FLUSH
10.0000 mL | INTRAVENOUS | Status: DC | PRN
  Filled 2016-07-08: qty 10

## 2016-07-08 MED ORDER — HEPARIN SOD (PORK) LOCK FLUSH 100 UNIT/ML IV SOLN
500.0000 [IU] | Freq: Once | INTRAVENOUS | Status: DC | PRN
  Filled 2016-07-08: qty 5

## 2016-07-08 MED ORDER — DIPHENHYDRAMINE HCL 25 MG PO TABS
50.0000 mg | ORAL_TABLET | Freq: Once | ORAL | Status: AC
  Administered 2016-07-08: 50 mg via ORAL
  Filled 2016-07-08: qty 2

## 2016-07-08 MED ORDER — TRASTUZUMAB CHEMO 150 MG IV SOLR
6.0000 mg/kg | Freq: Once | INTRAVENOUS | Status: AC
  Administered 2016-07-08: 504 mg via INTRAVENOUS
  Filled 2016-07-08: qty 24

## 2016-07-08 MED ORDER — ACETAMINOPHEN 325 MG PO TABS
ORAL_TABLET | ORAL | Status: AC
  Filled 2016-07-08: qty 2

## 2016-07-08 MED ORDER — DIPHENHYDRAMINE HCL 25 MG PO CAPS
ORAL_CAPSULE | ORAL | Status: AC
  Filled 2016-07-08: qty 2

## 2016-07-08 MED ORDER — SODIUM CHLORIDE 0.9 % IV SOLN
Freq: Once | INTRAVENOUS | Status: AC
  Administered 2016-07-08: 11:00:00 via INTRAVENOUS

## 2016-07-08 NOTE — Telephone Encounter (Signed)
Called by Infusion room nurse - pt scheduled for herceptin today.  Last Echo was 03/28/2016 with normal readings - pt may proceed

## 2016-07-08 NOTE — Progress Notes (Signed)
OK to treat today with ECHO from 03/28/16 per Val, RN, per MD Magrinat  Ok to have 50mg  benadryl today per Tivis Ringer, RN, per MD Magrinat

## 2016-07-08 NOTE — Patient Instructions (Signed)
[facility] Discharge Instructions for Patients Receiving Chemotherapy  Today you received the following chemotherapy agents: Herceptin   To help prevent nausea and vomiting after your treatment, we encourage you to take your nausea medication as directed.    If you develop nausea and vomiting that is not controlled by your nausea medication, call the clinic.   BELOW ARE SYMPTOMS THAT SHOULD BE REPORTED IMMEDIATELY:  *FEVER GREATER THAN 100.5 F  *CHILLS WITH OR WITHOUT FEVER  NAUSEA AND VOMITING THAT IS NOT CONTROLLED WITH YOUR NAUSEA MEDICATION  *UNUSUAL SHORTNESS OF BREATH  *UNUSUAL BRUISING OR BLEEDING  TENDERNESS IN MOUTH AND THROAT WITH OR WITHOUT PRESENCE OF ULCERS  *URINARY PROBLEMS  *BOWEL PROBLEMS  UNUSUAL RASH Items with * indicate a potential emergency and should be followed up as soon as possible.  Feel free to call the clinic you have any questions or concerns. The clinic phone number is [phone].  Please show the CHEMO ALERT CARD at check-in to the Emergency Department and triage nurse.   

## 2016-07-09 ENCOUNTER — Ambulatory Visit
Admission: RE | Admit: 2016-07-09 | Discharge: 2016-07-09 | Disposition: A | Discharge status: Home / Self Care | Payer: BLUE CROSS/BLUE SHIELD | Source: Ambulatory Visit | Attending: Radiation Oncology | Admitting: Radiation Oncology

## 2016-07-09 ENCOUNTER — Ambulatory Visit: Payer: BLUE CROSS/BLUE SHIELD | Attending: General Surgery | Admitting: Physical Therapy

## 2016-07-09 ENCOUNTER — Encounter: Payer: Self-pay | Admitting: Radiation Oncology

## 2016-07-09 DIAGNOSIS — Z90710 Acquired absence of both cervix and uterus: Secondary | ICD-10-CM | POA: Diagnosis not present

## 2016-07-09 DIAGNOSIS — C50412 Malignant neoplasm of upper-outer quadrant of left female breast: Secondary | ICD-10-CM | POA: Diagnosis present

## 2016-07-09 DIAGNOSIS — Z803 Family history of malignant neoplasm of breast: Secondary | ICD-10-CM | POA: Insufficient documentation

## 2016-07-09 DIAGNOSIS — Z170 Estrogen receptor positive status [ER+]: Secondary | ICD-10-CM | POA: Insufficient documentation

## 2016-07-09 DIAGNOSIS — M439 Deforming dorsopathy, unspecified: Secondary | ICD-10-CM | POA: Insufficient documentation

## 2016-07-09 DIAGNOSIS — M25612 Stiffness of left shoulder, not elsewhere classified: Secondary | ICD-10-CM | POA: Diagnosis present

## 2016-07-09 DIAGNOSIS — M25512 Pain in left shoulder: Secondary | ICD-10-CM

## 2016-07-09 DIAGNOSIS — Z880 Allergy status to penicillin: Secondary | ICD-10-CM | POA: Insufficient documentation

## 2016-07-09 DIAGNOSIS — K219 Gastro-esophageal reflux disease without esophagitis: Secondary | ICD-10-CM | POA: Insufficient documentation

## 2016-07-09 DIAGNOSIS — Z8249 Family history of ischemic heart disease and other diseases of the circulatory system: Secondary | ICD-10-CM | POA: Insufficient documentation

## 2016-07-09 DIAGNOSIS — R222 Localized swelling, mass and lump, trunk: Secondary | ICD-10-CM | POA: Insufficient documentation

## 2016-07-09 DIAGNOSIS — Z87440 Personal history of urinary (tract) infections: Secondary | ICD-10-CM | POA: Diagnosis not present

## 2016-07-09 DIAGNOSIS — G43909 Migraine, unspecified, not intractable, without status migrainosus: Secondary | ICD-10-CM | POA: Insufficient documentation

## 2016-07-09 DIAGNOSIS — Z8489 Family history of other specified conditions: Secondary | ICD-10-CM | POA: Insufficient documentation

## 2016-07-09 DIAGNOSIS — Z9889 Other specified postprocedural states: Secondary | ICD-10-CM | POA: Insufficient documentation

## 2016-07-09 DIAGNOSIS — Z800 Family history of malignant neoplasm of digestive organs: Secondary | ICD-10-CM | POA: Diagnosis not present

## 2016-07-09 DIAGNOSIS — Z510 Encounter for antineoplastic radiation therapy: Secondary | ICD-10-CM | POA: Diagnosis not present

## 2016-07-09 DIAGNOSIS — Z8052 Family history of malignant neoplasm of bladder: Secondary | ICD-10-CM | POA: Insufficient documentation

## 2016-07-09 DIAGNOSIS — Z888 Allergy status to other drugs, medicaments and biological substances status: Secondary | ICD-10-CM | POA: Diagnosis not present

## 2016-07-09 DIAGNOSIS — R293 Abnormal posture: Secondary | ICD-10-CM | POA: Diagnosis present
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
## 2016-07-09 NOTE — Progress Notes (Signed)
Please see the Nurse Progress Note in the MD Initial Consult Encounter for this patient. 

## 2016-07-09 NOTE — Progress Notes (Signed)
Radiation Oncology         [phone] ________________________________  Name: [name] MRN: [id number]  Date: 07/09/2016  DOB: Nov 04, 1975  CC:No PCP Per Patient  Magrinat, Virgie Dad, MD     REFERRING PHYSICIAN: Magrinat, Virgie Dad, MD   DIAGNOSIS: The encounter diagnosis was Malignant neoplasm of upper-outer quadrant of left breast in female, estrogen receptor positive ([place]).    HISTORY OF PRESENT ILLNESS: [name] is a 41 y.o. female who was found in the summer of 2017 to have left breast pain and a palpable mass. She  proceeded to a diagnositc bilateral mammogram and ultrasound of the left breast the same day. Mammogram noted a 1.7 cm high density mass in the upper outer quadrant of the left breast corresponding the area of palpable concern and ultrasound noted an irregular mass spanning 1.4 x 1.7 x 1.9 cm corresponding to the area of palpable concern. Biopsy of this mass on 11/20/15 revealed grade 2 invasive ductal carcinoma (ER positive (10-50%), PR negative, HER2 equivocal).  The patient relocated from [place], [place] to [place] after her diagnosis so that she would be closer to family for her care. She was evaluated on 12/05/2015, due to left-sided chest pain, and a CT/angiogram did not reveal evidence of a pulmonary embolus. The left breast nodule measured 1.3 cm on the study. There was no enlarged axillary adenopathy. There was also no evidence of lung metastasis, blastic, or destructive lytic bone lesions. The upper abdomen was unremarkable. Of note, a right hilar lymph node measuring 1.2 cm and a subcarinal lymph node measuring 1 cm were found.  The patient presented to Dr. Jana Hakim on 12/11/15, who started the patient on neoadjuvant Tamoxifen the same day and scheduled the patient for genetics counseling on 12/12/15 to determine if the patient has a deleterious mutation. Genetics testing revealed no abnormalities. An MRI of the bilateral breasts on 12/18/15 noted an  irregular enhancing mass (corresponding to the known biopsy-proven malignancy) in the UOQ of the left breast measuring 2.5 x 2.2 x 1.8 cm. Non-mass enhancement extended 1.5 cm anteriorly and 2.0 cm posteriorly. The combination of the non-mass enhancement and the mass spanned 5.3 cm in the AP dimension.  The patient has undergone neoadjuvant chemotherapy consisting of carboplatin, docetaxel, trastuzumab, and pertuzumab beginning 01/01/16 and completed 04/15/16. She  underwent left breast lumpectomy with radioactive seed implant and sentinel lymph node biopsy on 05/12/16. Final pathology revealed treatment related effects without evidence of malignancy, and in the lymph nodes removed, none contained disease. Due to breast distortion she subsequently underwent a lateral mastopexy on 05/20/2016. Of the tissue that was resected, both specimens were benign. She is currently on anti-estrogens.The patient presents today to discuss the role of radiation in the management of her disease. Of note she has also been scheduled for bilateral oophorectomy next week.   PREVIOUS RADIATION THERAPY: No   PAST MEDICAL HISTORY:  Past Medical History:  Diagnosis Date  . Allergy   . Anxiety    has panic attacks  . Asthma    childhood, none now  . Breast cancer ([place]) 11/20/15   left breast  . Depression   . Endometriosis   . Family history of breast cancer   . GERD (gastroesophageal reflux disease)   . Herniated disc, cervical   . History of bronchitis as a child   . History of cancer chemotherapy    left breast  . History of stomach ulcers   . History of urinary  tract infection   . Hormone disorder   . IBD (inflammatory bowel disease)   . Migraine without aura   . Neck pain   . PONV (postoperative nausea and vomiting)   . Shortness of breath dyspnea    anxiety related       PAST SURGICAL HISTORY: Past Surgical History:  Procedure Laterality Date  . ABDOMINAL HYSTERECTOMY    . laparoscopically assisted  vaginal hysterectomy  01/2006   Still has ovaries  . LAPAROSCOPY     x 2, diagnosed with endometriosis (prior to hysterectomy), only treated medically.  Marland Kitchen MASTOPEXY Bilateral 05/20/2016   Procedure: MASTOPEXY;  Surgeon: Irene Limbo, MD;  Location: [facility];  Service: Plastics;  Laterality: Bilateral;  . PORTACATH PLACEMENT Right 12/25/2015   Procedure: INSERTION PORT-A-CATH WITH ULTRA SOUND;  Surgeon: [name], MD;  Location[facility];  Service: General;  Laterality: Right;  . RADIOACTIVE SEED GUIDED MASTECTOMY WITH AXILLARY SENTINEL LYMPH NODE BIOPSY Left 05/12/2016   Procedure: BREAST LUMPECTOMY WITH RADIOACTIVE SEED AND SENTINEL LYMPH NODE BIOPSY AND BLUE DYE INJECTION;  Surgeon: [name], MD;  Location: [facility];  Service: General;  Laterality: Left;     FAMILY HISTORY:  Family History  Problem Relation Age of Onset  . Sarcoidosis Mother   . Hypertension Father   . Throat cancer Maternal Grandfather     smoker and heavy drinker; dx in his late 12s-50s  . Cancer Paternal Grandmother     possible gatric vs bladder cancer  . Bladder Cancer Paternal Grandmother   . Breast cancer Paternal Aunt     dxin her 74s; dad's maternal half sister  . Breast cancer Other     PGFs mother  . Anesthesia problems Neg Hx   . Hypotension Neg Hx   . Malignant hyperthermia Neg Hx   . Pseudochol deficiency Neg Hx      SOCIAL HISTORY:  reports that she has never smoked. She has never used smokeless tobacco. She reports that she drinks alcohol. She reports that she does not use drugs. The patient is married. 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 Her husband recently finished [employer] and is going to be returning to [place] in the near future, though the patient intends to stay in [place] until she completes all of her treatment. Her son is a Ship broker at [employer].   ALLERGIES: Amoxicillin and Diflucan [fluconazole]   MEDICATIONS:  Current Outpatient Prescriptions  Medication Sig Dispense Refill  . methocarbamol (ROBAXIN) 500 MG tablet Take 1 tablet (500 mg total) by mouth every 8 (eight) hours as needed for muscle spasms. 30 tablet 0  . sertraline (ZOLOFT) 100 MG tablet Take 1 tablet (100 mg total) by mouth daily. 30 tablet 3  . traMADol (ULTRAM) 50 MG tablet TAKE 1 TABLET EVERY 6 HOURS 60 tablet 0  . zolpidem (AMBIEN) 10 MG tablet Take 1/2 to 1 tab po qhs prn 30 tablet 1  . LORazepam (ATIVAN) 0.5 MG tablet TAKE 1 TABLET AT BEDTIME AS NEEDED FOR NAUSEA AND VOMITING (Patient not taking: Reported on 07/09/2016) 30 tablet 0  . ondansetron (ZOFRAN) 8 MG tablet Take 1 tablet (8 mg total) by mouth every 8 (eight) hours as needed for nausea or vomiting. (Patient not taking: Reported on 07/09/2016) 30 tablet 2  . oxyCODONE (OXY IR/ROXICODONE) 5 MG immediate release tablet Take 1-2 tablets (5-10 mg  total) by mouth every 4 (four) hours as needed for moderate pain. (Patient not taking: Reported on 07/09/2016) 50 tablet 0  . valACYclovir (VALTREX) 500 MG tablet Take 1 tablet (500 mg total) by mouth daily. (Patient not taking: Reported on 07/09/2016) 60 tablet 1   No current facility-administered medications for this encounter.      REVIEW OF SYSTEMS:  On review of systems, the patient reports that she is doing well overall. The patient reports some soreness and pain in the left breast since surgery but denies edema. She also reports some irritation on the back of her hand following IV yesterday when her PAC could not be accessed. She continues to participate in PT to maintain range of motion in her left arm. She denies shortness of breath, cough, fevers, chills, night sweats, unintended weight changes. She denies any bowel or bladder disturbances, and denies abdominal pain, nausea or vomiting. She denies any new musculoskeletal or joint aches or  pains. A complete review of systems is obtained and is otherwise negative.  PHYSICAL EXAM:  height is _0  (1.626 m) and weight is 198 lb (89.8 kg). Her oral temperature is 98.2 F (36.8 C). Her blood pressure is 124/76 and her pulse is 80. Her respiration is 16.   In general this is a well appearing African-American female in no acute distress. She is alert and oriented x4 and appropriate throughout the examination. HEENT reveals that the patient is normocephalic, atraumatic. EOMs are intact. PERRLA. Skin is intact without any evidence of gross lesions. Cardiovascular exam reveals a regular rate and rhythm, no clicks rubs or murmurs are auscultated. Chest is clear to auscultation bilaterally. Lymphatic assessment is performed and does not reveal any adenopathy in the cervical, supraclavicular, axillary, or inguinal chains. Abdomen has active bowel sounds in all quadrants and is intact. The abdomen is soft, non tender, non distended. Lower extremities are negative for pretibial pitting edema, deep calf tenderness, cyanosis or clubbing. Bilateral breasts are examined and are well-healed in terms of her surgical resection. On the left you can see just barely a distinguishing characteristic of the left incision line that the patient represented states represents her previous lumpectomy site. She also has a axillary incision. Both of which are well-healed without evidence of cellulitic streaking bleeding, drainage or fullness. No evidence of upper extremity edema is identified at either extremities.  ECOG = 1  0 - Asymptomatic (Fully active, able to carry on all predisease activities without restriction)  1 - Symptomatic but completely ambulatory (Restricted in physically strenuous activity but ambulatory and able to carry out work of a light or sedentary nature. For example, light housework, office work)  2 - Symptomatic, <50% in bed during the day (Ambulatory and capable of all self care but unable to  carry out any work activities. Up and about more than 50% of waking hours)  3 - Symptomatic, >50% in bed, but not bedbound (Capable of only limited self-care, confined to bed or chair 50% or more of waking hours)  4 - Bedbound (Completely disabled. Cannot carry on any self-care. Totally confined to bed or chair)  5 - Death   Eustace Pen MM, Creech RH, Tormey DC, et al. [phone]). "Toxicity and response criteria of the [facility]". [place] Oncol. 5 (6): 649-55    LABORATORY DATA:  Lab Results  Component Value Date   WBC 6.2 07/08/2016   HGB 10.7 (L) 07/08/2016   HCT 31.2 (L) 07/08/2016   MCV 93.2  07/08/2016   PLT 220 07/08/2016   Lab Results  Component Value Date   NA 138 07/08/2016   K 3.6 07/08/2016   CL 107 12/04/2015   CO2 24 07/08/2016   Lab Results  Component Value Date   ALT 18 07/08/2016   AST 19 07/08/2016   ALKPHOS 81 07/08/2016   BILITOT 0.27 07/08/2016      RADIOGRAPHY: No results found.     IMPRESSION/PLAN:  1. Stage IA, T1c, N0, ER Positive, PR negative, HER2 equivocal  invasive ductal carcinoma of the left breast, post operative. Dr. Lisbeth Renshaw met with the patient today regarding her diagnosis and options for treatment. Now that she has completed her chemotherapy, and surgical intervention, we discussed that it would be time for her to consider radiotherapy to the left breast. She will also concurrently continue Herceptin which she is due to finish in June 2018. We discussed the process of CT simulation and the placement of tattoos. We discussed a typical radiation treatment would include 33 fractions over 6 1/2 weeks as an outpatient. We discussed the possibility of asymptomatic lung damage. We discussed the low likelihood of secondary malignancies. We discussed the possible side effects; including but not limited to skin redness, fatigue, permanent skin darkening, and breast swelling.  We discussed the use of cardiac sparing with deep  inspiration breath hold if needed. The patient is enthusiastic about proceeding with treatment. A consent form is discussed, signed, and placed in the patient's chart. She is scheduled for CT Simulation next week with the intent to begin radiation treatments soon after.    Carola Rhine, PAC  This document serves as a record of services personally performed by Kyung Rudd, MD and Shona [name], PA. It was created on their behalf by Maryla Morrow, a trained medical scribe. The creation of this record is based on the scribe's personal observations and the provider's statements to them. This document has been checked and approved by the attending provider.

## 2016-07-10 ENCOUNTER — Ambulatory Visit: Payer: BLUE CROSS/BLUE SHIELD | Admitting: Physical Therapy

## 2016-07-14 ENCOUNTER — Encounter: Payer: Self-pay | Admitting: Physical Therapy

## 2016-07-14 ENCOUNTER — Ambulatory Visit: Payer: BLUE CROSS/BLUE SHIELD | Admitting: Physical Therapy

## 2016-07-14 ENCOUNTER — Inpatient Hospital Stay (HOSPITAL_COMMUNITY)
Admission: RE | Admit: 2016-07-14 | Discharge: 2016-07-14 | Disposition: A | Discharge status: Home / Self Care | Payer: BLUE CROSS/BLUE SHIELD | Source: Ambulatory Visit

## 2016-07-14 DIAGNOSIS — M25512 Pain in left shoulder: Secondary | ICD-10-CM

## 2016-07-14 DIAGNOSIS — M25612 Stiffness of left shoulder, not elsewhere classified: Secondary | ICD-10-CM
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
## 2016-07-15 NOTE — Progress Notes (Signed)
Pt met with counselor for a session today at 3pm.   Pt shared with counselor about a recent conversation with family members that she had been planning to have; pt was pleased with the outcome of the conversation and the improved relational closeness. Pt's affect and mood were lighter than in previous sessions; pt said that she feels she's getting back to herself after chemo treatments.  Pt and counselor explored pt's journey through treatment and the ways that she is now reclaiming her interests and habits. Pt named several goals that she has for herself in the coming months; counselor reflected pt's determination and evident joy in "feeling like herself" again. Counselor named the renewed sense of gratitude experienced by some patients after treatment, pt identified strongly with this idea. Pt reiterated how helpful counseling has been for her and said that she wants to continue meeting.  Pt will contact counselor when she receives her radiation schedule.  Rosana Fret, Counseling Intern Supervisor, Lorrin Jackson, Chaplain

## 2016-07-16 ENCOUNTER — Ambulatory Visit: Payer: BLUE CROSS/BLUE SHIELD | Admitting: Physical Therapy

## 2016-07-16 ENCOUNTER — Other Ambulatory Visit: Payer: Self-pay | Admitting: *Deleted

## 2016-07-16 ENCOUNTER — Ambulatory Visit
Admission: RE | Admit: 2016-07-16 | Discharge: 2016-07-16 | Disposition: A | Discharge status: Home / Self Care | Payer: BLUE CROSS/BLUE SHIELD | Source: Ambulatory Visit | Attending: Radiation Oncology | Admitting: Radiation Oncology

## 2016-07-16 ENCOUNTER — Telehealth: Payer: Self-pay | Admitting: *Deleted

## 2016-07-16 VITALS — BP 128/84 | HR 84 | Temp 98.0°F | Resp 12

## 2016-07-16 DIAGNOSIS — C50412 Malignant neoplasm of upper-outer quadrant of left female breast: Secondary | ICD-10-CM

## 2016-07-16 DIAGNOSIS — Z170 Estrogen receptor positive status [ER+]: Secondary | ICD-10-CM

## 2016-07-16 DIAGNOSIS — M25612 Stiffness of left shoulder, not elsewhere classified: Secondary | ICD-10-CM

## 2016-07-16 DIAGNOSIS — M25512 Pain in left shoulder: Secondary | ICD-10-CM

## 2016-07-16 DIAGNOSIS — Z510 Encounter for antineoplastic radiation therapy: Secondary | ICD-10-CM | POA: Diagnosis not present

## 2016-07-16 DIAGNOSIS — M439 Deforming dorsopathy, unspecified: Secondary | ICD-10-CM

## 2016-07-16 DIAGNOSIS — R222 Localized swelling, mass and lump, trunk: Secondary | ICD-10-CM

## 2016-07-16 NOTE — Progress Notes (Signed)
  Radiation Oncology         [phone]) [phone] ________________________________  Name: [name] MRN: [id number]  Date: 07/16/2016  DOB: [birth year]  DIAGNOSIS:     ICD-9-CM ICD-10-CM   1. Malignant neoplasm of upper-outer quadrant of left breast in female, estrogen receptor positive ([place]) 174.4 C50.412    V86.0 Z17.0      SIMULATION AND TREATMENT PLANNING NOTE  The patient presented for simulation prior to beginning her course of radiation treatment for her diagnosis of left-sided breast cancer. The patient was placed in a supine position on a breast board. A customized vac-lock bag was constructed and this complex treatment device will be used on a daily basis during her treatment. In this fashion, a CT scan was obtained through the chest area and an isocenter was placed near the chest wall within the breast.  The patient will be planned to receive a course of radiation initially to a dose of 50.4 Gy. This will consist of a whole breast radiotherapy technique. To accomplish this, 2 customized blocks have been designed which will correspond to medial and lateral whole breast tangent fields. This treatment will be accomplished at 1.8 Gy per fraction. A forward planning technique will also be evaluated to determine if this approach improves the plan. It is anticipated that the patient will then receive a 10 Gy boost to the seroma cavity which has been contoured. This will be accomplished at 2 Gy per fraction.   This initial treatment will consist of a 3-D conformal technique. The seroma has been contoured as the primary target structure. Additionally, dose volume histograms of both this target as well as the lungs and heart will also be evaluated. Such an approach is necessary to ensure that the target area is adequately covered while the nearby critical  normal structures are adequately spared.  Plan:  The final anticipated total dose therefore will correspond to 60.4 Gy.   Special  treatment procedure was performed today due to the extra time and effort required by myself to plan and prepare this patient for deep inspiration breath hold technique.  I have determined cardiac sparing to be of benefit to this patient to prevent long term cardiac damage due to radiation of the heart.  Bellows were placed on the patient's abdomen. To facilitate cardiac sparing, the patient was coached by the radiation therapists on breath hold techniques and breathing practice was performed. Practice waveforms were obtained. The patient was then scanned while maintaining breath hold in the treatment position.  This image was then transferred over to the imaging specialist. The imaging specialist then created a fusion of the free breathing and breath hold scans using the chest wall as the stable structure. I personally reviewed the fusion in axial, coronal and sagittal image planes.  Excellent cardiac sparing was obtained.  I felt the patient is an appropriate candidate for breath hold and the patient will be treated as such.  The image fusion was then reviewed with the patient to reinforce the necessity of reproducible breath hold.      _______________________________   Jodelle Gross, MD, PhD

## 2016-07-16 NOTE — Telephone Encounter (Signed)
Pt states she needs a refill of robaxin, tramadol and ambien.

## 2016-07-16 NOTE — Progress Notes (Signed)
Received a call from one of the radiation therapist in the simulation area reporting that a patient is feeling lightheaded and would like their blood pressure checked.  Assessed patients vital signs and documented.  Pt reports she feels this way when she is feeling anxious.  Instructed patient to change positions slowly especially when going from laying to standing, to daggle legs.  She reports she had oatmeal for breakfast.  Instructed patient to come over to nursing if she was still feeling this way after simulation.  Pt agreed to plan.
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
## 2016-07-16 NOTE — Progress Notes (Signed)
  Radiation Oncology         [phone]) [phone] ________________________________  Name: DEAMBER WANZER MRN: [id number]  Date: 07/16/2016  DOB: 1976-06-05  Optical Surface Tracking Plan:  Since intensity modulated radiotherapy (IMRT) and 3D conformal radiation treatment methods are predicated on accurate and precise positioning for treatment, intrafraction motion monitoring is medically necessary to ensure accurate and safe treatment delivery.  The ability to quantify intrafraction motion without excessive ionizing radiation dose can only be performed with optical surface tracking. Accordingly, surface imaging offers the opportunity to obtain 3D measurements of patient position throughout IMRT and 3D treatments without excessive radiation exposure.  I am ordering optical surface tracking for this patient's upcoming course of radiotherapy. ________________________________  Kyung Rudd, MD 07/16/2016 11:46 AM    Reference:   Ursula Alert, J, et al. Surface imaging-based analysis of intrafraction motion for breast radiotherapy patients.Journal of [place], n. 6, nov. 2014. ISSN [id number].   Available at: <http://www.jacmp.org/index.php/jacmp/article/view/4957>.

## 2016-07-17 ENCOUNTER — Other Ambulatory Visit: Payer: Self-pay | Admitting: *Deleted

## 2016-07-17 ENCOUNTER — Telehealth: Payer: Self-pay | Admitting: *Deleted

## 2016-07-17 DIAGNOSIS — C50219 Malignant neoplasm of upper-inner quadrant of unspecified female breast: Secondary | ICD-10-CM

## 2016-07-17 DIAGNOSIS — Z171 Estrogen receptor negative status [ER-]: Secondary | ICD-10-CM

## 2016-07-17 MED ORDER — TRAMADOL HCL 50 MG PO TABS: 50.0000 mg | ORAL_TABLET | Freq: Four times a day (QID) | ORAL | 0 refills | Status: DC | PRN

## 2016-07-17 MED ORDER — ZOLPIDEM TARTRATE 10 MG PO TABS: ORAL_TABLET | ORAL | 1 refills | Status: DC

## 2016-07-17 MED ORDER — METHOCARBAMOL 500 MG PO TABS: 500.0000 mg | ORAL_TABLET | Freq: Three times a day (TID) | ORAL | 1 refills | Status: DC | PRN

## 2016-07-17 NOTE — Telephone Encounter (Signed)
This RN refilled medications via phone in including adding refills x 1 on each prescription.  This RN notified Jeanett Schlein per above with apology regarding refill not obtained yesterday - informed Jeanett Schlein refill x 1 given with request for her or patient to call when last refill obtained to prevent pt from being with out needed medications.  Jeanett Schlein stated appreciation and understanding per above.

## 2016-07-17 NOTE — Telephone Encounter (Signed)
"  I called yesterday and last night for for my daughter's  refills.  [facility] has not received refills yet.  She is out of Tramadol.  Call my cell [phone]."

## 2016-07-18 ENCOUNTER — Ambulatory Visit (HOSPITAL_COMMUNITY)
Admission: RE | Admit: 2016-07-18 | Discharge: 2016-07-18 | Disposition: A | Discharge status: Home / Self Care | Payer: BLUE CROSS/BLUE SHIELD | Source: Ambulatory Visit | Attending: Cardiology | Admitting: Cardiology

## 2016-07-18 ENCOUNTER — Ambulatory Visit (HOSPITAL_BASED_OUTPATIENT_CLINIC_OR_DEPARTMENT_OTHER)
Admission: RE | Admit: 2016-07-18 | Discharge: 2016-07-18 | Disposition: A | Discharge status: Home / Self Care | Payer: BLUE CROSS/BLUE SHIELD | Source: Ambulatory Visit | Attending: Internal Medicine | Admitting: Internal Medicine

## 2016-07-18 VITALS — BP 112/72 | HR 75 | Wt 197.0 lb

## 2016-07-18 DIAGNOSIS — Z803 Family history of malignant neoplasm of breast: Secondary | ICD-10-CM | POA: Insufficient documentation

## 2016-07-18 DIAGNOSIS — F419 Anxiety disorder, unspecified: Secondary | ICD-10-CM | POA: Diagnosis not present

## 2016-07-18 DIAGNOSIS — C50412 Malignant neoplasm of upper-outer quadrant of left female breast: Secondary | ICD-10-CM

## 2016-07-18 DIAGNOSIS — Z170 Estrogen receptor positive status [ER+]: Secondary | ICD-10-CM | POA: Diagnosis not present

## 2016-07-18 DIAGNOSIS — C50912 Malignant neoplasm of unspecified site of left female breast: Secondary | ICD-10-CM | POA: Insufficient documentation

## 2016-07-18 LAB — ECHOCARDIOGRAM COMPLETE
E decel time: 158 msec
E/e' ratio: 7.14
FS: 35 % (ref 28–44)
IVS/LV PW RATIO, ED: 0.94
LA ID, A-P, ES: 35 mm
LA diam end sys: 35 mm
LA diam index: 1.79 cm/m2
LA vol A4C: 40.1 ml
LA vol index: 22.8 mL/m2
LA vol: 44.5 mL
LV E/e' medial: 7.14
LV E/e'average: 7.14
LV PW d: 8.5 mm — AB (ref 0.6–1.1)
LV e' LATERAL: 13.8 cm/s
LVOT SV: 53 mL
LVOT VTI: 21 cm
LVOT area: 2.54 cm2
LVOT diameter: 18 mm
LVOT peak vel: 106 cm/s
Lateral S' vel: 11.6 cm/s
MV Dec: 158
MV Peak grad: 4 mmHg
MV pk A vel: 78.6 m/s
MV pk E vel: 98.5 m/s
TAPSE: 22.2 mm
TDI e' lateral: 13.8
TDI e' medial: 8.38

## 2016-07-18 NOTE — Addendum Note (Signed)
Encounter addended by: Kennieth Rad, RN on: 07/18/2016 10:52 AM<BR>    Actions taken: Order list changed, Diagnosis association updated

## 2016-07-18 NOTE — Progress Notes (Signed)
  Echocardiogram 2D Echocardiogram has been performed.  [name] 07/18/2016, 10:00 AM

## 2016-07-18 NOTE — Progress Notes (Signed)
Patient ID: [name], female   DOB: 1976/04/21, 41 y.o.   MRN: [id number]   CARDI-ONCOLOGY CLINIC NOTE  Referring Physician: Dr Jana Hakim  No Pcp Per Patient as PCP - General (General Practice) [name], MD as Consulting Physician (Oncology) [name], MD as Consulting Physician (General Surgery) [name], MD as Consulting Physician (Obstetrics and Gynecology)  HPI: 41 y.o. [place] woman status post left breast upper outer quadrant biopsy 11/20/2015 for a clinical T1c N0, stage IA invasive ductal carcinoma, grade 2, estrogen receptor moderately positive, progesterone receptor negative, HER-2 positive. Also has history of anxiety and endometriosis.    Relocated from [place] to [place] to live with her Mom while she is undergoing treatment. She is getting 6 cycles of carboplatin/docetaxel/trastuzumab/pertuzumab then will get Herceptin alone to complete 1 year.   S/p mastectomy with Dr. Donne Hazel in 11/17. Lumpectomy and reconstruction   She finished her chemo on 04/15/16 and receiving Herceptin q3 weeks. Will finish Herceptin in 6/18. Starts XRT next week. Tolerating well. Occasional SOB with vigorous exertion. No orthopnea or edema.    - Echo (9/17): EF 60-65%, lateral s' 12.8 cm/sec, GLS -20.1%.  - Echo (07/19/15): EF 60-65% lateral s' 12.4 cm/sec GLS -21.2%   FH: Grandmother - MI years ago.   Review of Systems:  All systems reviewed and negative except as per HPI.   Past Medical History:  Diagnosis Date  . Allergy   . Anxiety    has panic attacks  . Asthma    childhood, none now  . Breast cancer ([place]) 11/20/15   left breast  . Depression   . Endometriosis   . Family history of breast cancer   . GERD (gastroesophageal reflux disease)   . Herniated disc, cervical   . History of bronchitis as a child   . History of cancer chemotherapy    left breast  . History of stomach ulcers   . History of urinary tract infection   . Hormone  disorder   . IBD (inflammatory bowel disease)   . Migraine without aura   . Neck pain   . PONV (postoperative nausea and vomiting)   . Shortness of breath dyspnea    anxiety related    Current Outpatient Prescriptions  Medication Sig Dispense Refill  . methocarbamol (ROBAXIN) 500 MG tablet Take 1 tablet (500 mg total) by mouth every 8 (eight) hours as needed for muscle spasms. 30 tablet 1  . sertraline (ZOLOFT) 100 MG tablet Take 1 tablet (100 mg total) by mouth daily. 30 tablet 3  . traMADol (ULTRAM) 50 MG tablet Take 1 tablet (50 mg total) by mouth every 6 (six) hours as needed. 60 tablet 0  . zolpidem (AMBIEN) 10 MG tablet Take 1/2 to 1 tab po qhs prn 30 tablet 1  . LORazepam (ATIVAN) 0.5 MG tablet TAKE 1 TABLET AT BEDTIME AS NEEDED FOR NAUSEA AND VOMITING (Patient not taking: Reported on 07/18/2016) 30 tablet 0  . ondansetron (ZOFRAN) 8 MG tablet Take 1 tablet (8 mg total) by mouth every 8 (eight) hours as needed for nausea or vomiting. (Patient not taking: Reported on 07/18/2016) 30 tablet 2  . oxyCODONE (OXY IR/ROXICODONE) 5 MG immediate release tablet Take 1-2 tablets (5-10 mg total) by mouth every 4 (four) hours as needed for moderate pain. (Patient not taking: Reported on 07/18/2016) 50 tablet 0  . valACYclovir (VALTREX) 500 MG tablet Take 1 tablet (500 mg total) by mouth daily. (Patient not taking: Reported on 07/18/2016) 60  tablet 1   No current facility-administered medications for this encounter.     Allergies  Allergen Reactions  . Amoxicillin Shortness Of Breath and Itching    Has patient had a PCN reaction causing immediate rash, facial/tongue/throat swelling, SOB or lightheadedness with hypotension: Yes Has patient had a PCN reaction causing severe rash involving mucus membranes or skin necrosis: No Has patient had a PCN reaction that required hospitalization Yes Has patient had a PCN reaction occurring within the last 10 years: No If all of the above answers are "NO", then  may proceed with Cephalosporin use.   . Diflucan [Fluconazole] Nausea Only    heartburn      Social History   Social History  . Marital status: Married    Spouse name: N/A  . Number of children: 1  . Years of education: N/A   Occupational History  . Not on file.   Social History Main Topics  . Smoking status: Never Smoker  . Smokeless tobacco: Never Used  . Alcohol use 0.0 oz/week     Comment: socially   . Drug use: No  . Sexual activity: Yes    Partners: Male   Other Topics Concern  . Not on file   Social History Narrative  . No narrative on file      Family History  Problem Relation Age of Onset  . Sarcoidosis Mother   . Hypertension Father   . Throat cancer Maternal Grandfather     smoker and heavy drinker; dx in his late 28s-50s  . Cancer Paternal Grandmother     possible gatric vs bladder cancer  . Bladder Cancer Paternal Grandmother   . Breast cancer Paternal Aunt     dxin her 60s; dad's maternal half sister  . Breast cancer Other     PGFs mother  . Anesthesia problems Neg Hx   . Hypotension Neg Hx   . Malignant hyperthermia Neg Hx   . Pseudochol deficiency Neg Hx     Vitals:   07/18/16 1000  BP: 112/72  Pulse: 75  SpO2: 100%  Weight: 197 lb (89.4 kg)    PHYSICAL EXAM: General:  Well appearing. No respiratory difficulty HEENT: normal Neck: supple. no JVD. Carotids 2+ bilat; no bruits. No lymphadenopathy or thryomegaly appreciated. Cor: PMI nondisplaced. Regular rate & rhythm. No rubs, gallops.  1/6 early SEM RUB. R upper chest scar from porta cath.  Lungs: clear Abdomen: soft, nontender, nondistended. No hepatosplenomegaly. No bruits or masses. Good bowel sounds. Extremities: no cyanosis, clubbing, rash, edema Neuro: alert & oriented x 3, cranial nerves grossly intact. moves all 4 extremities w/o difficulty. Affect pleasant.   ASSESSMENT & PLAN: 1) L Breast Cancer : T1c N0, stage IA invasive ductal carcinoma, grade 2, estrogen receptor  moderately positive, progesterone receptor negative, HER-2+ --I reviewed echos personally. EF and Doppler parameters stable. No HF on exam. Continue Herceptin.  --Will do one more surveillance echo in 4/18 prior to her finishing Herceptin in 6/18  Glori Bickers MD 07/18/2016

## 2016-07-21 ENCOUNTER — Encounter: Payer: BLUE CROSS/BLUE SHIELD | Admitting: Physical Therapy

## 2016-07-21 ENCOUNTER — Ambulatory Visit: Admit: 2016-07-21 | Payer: BLUE CROSS/BLUE SHIELD | Admitting: Obstetrics and Gynecology

## 2016-07-21 SURGERY — SALPINGO-OOPHORECTOMY, LAPAROSCOPIC
Anesthesia: General | Laterality: Bilateral

## 2016-07-23 ENCOUNTER — Inpatient Hospital Stay: Admission: RE | Admit: 2016-07-23 | Payer: Self-pay | Source: Ambulatory Visit

## 2016-07-23 ENCOUNTER — Encounter: Payer: BLUE CROSS/BLUE SHIELD | Admitting: Physical Therapy

## 2016-07-23 ENCOUNTER — Ambulatory Visit: Payer: BLUE CROSS/BLUE SHIELD | Admitting: Radiation Oncology

## 2016-07-23 DIAGNOSIS — Z510 Encounter for antineoplastic radiation therapy: Secondary | ICD-10-CM | POA: Diagnosis not present

## 2016-07-24 ENCOUNTER — Ambulatory Visit: Admission: RE | Admit: 2016-07-24 | Payer: BLUE CROSS/BLUE SHIELD | Source: Ambulatory Visit

## 2016-07-24 ENCOUNTER — Ambulatory Visit: Payer: BLUE CROSS/BLUE SHIELD | Admitting: Radiation Oncology

## 2016-07-25 ENCOUNTER — Encounter: Payer: Self-pay | Admitting: Radiation Oncology

## 2016-07-25 ENCOUNTER — Ambulatory Visit: Payer: BLUE CROSS/BLUE SHIELD | Admitting: Radiation Oncology

## 2016-07-28 ENCOUNTER — Ambulatory Visit: Payer: BLUE CROSS/BLUE SHIELD | Admitting: Physical Therapy

## 2016-07-28 ENCOUNTER — Other Ambulatory Visit: Payer: Self-pay | Admitting: *Deleted

## 2016-07-28 ENCOUNTER — Ambulatory Visit
Admission: RE | Admit: 2016-07-28 | Discharge: 2016-07-28 | Disposition: A | Discharge status: Home / Self Care | Payer: BLUE CROSS/BLUE SHIELD | Source: Ambulatory Visit | Attending: Radiation Oncology | Admitting: Radiation Oncology

## 2016-07-28 DIAGNOSIS — Z170 Estrogen receptor positive status [ER+]: Secondary | ICD-10-CM

## 2016-07-28 DIAGNOSIS — C50412 Malignant neoplasm of upper-outer quadrant of left female breast: Secondary | ICD-10-CM

## 2016-07-28 DIAGNOSIS — Z510 Encounter for antineoplastic radiation therapy: Secondary | ICD-10-CM | POA: Diagnosis not present

## 2016-07-29 ENCOUNTER — Other Ambulatory Visit: Payer: Self-pay | Admitting: *Deleted

## 2016-07-29 ENCOUNTER — Telehealth: Payer: Self-pay | Admitting: *Deleted

## 2016-07-29 ENCOUNTER — Ambulatory Visit (HOSPITAL_BASED_OUTPATIENT_CLINIC_OR_DEPARTMENT_OTHER): Payer: BLUE CROSS/BLUE SHIELD | Admitting: Oncology

## 2016-07-29 ENCOUNTER — Ambulatory Visit
Admission: RE | Admit: 2016-07-29 | Discharge: 2016-07-29 | Disposition: A | Discharge status: Home / Self Care | Payer: BLUE CROSS/BLUE SHIELD | Source: Ambulatory Visit | Attending: Radiation Oncology | Admitting: Radiation Oncology

## 2016-07-29 ENCOUNTER — Other Ambulatory Visit: Payer: BLUE CROSS/BLUE SHIELD

## 2016-07-29 ENCOUNTER — Ambulatory Visit (HOSPITAL_BASED_OUTPATIENT_CLINIC_OR_DEPARTMENT_OTHER): Payer: BLUE CROSS/BLUE SHIELD

## 2016-07-29 VITALS — BP 113/65 | HR 67 | Temp 98.7°F | Resp 16

## 2016-07-29 DIAGNOSIS — C50412 Malignant neoplasm of upper-outer quadrant of left female breast: Secondary | ICD-10-CM

## 2016-07-29 DIAGNOSIS — Z170 Estrogen receptor positive status [ER+]: Secondary | ICD-10-CM

## 2016-07-29 DIAGNOSIS — Z79810 Long term (current) use of selective estrogen receptor modulators (SERMs): Secondary | ICD-10-CM

## 2016-07-29 DIAGNOSIS — Z510 Encounter for antineoplastic radiation therapy: Secondary | ICD-10-CM | POA: Diagnosis not present

## 2016-07-29 DIAGNOSIS — Z5112 Encounter for antineoplastic immunotherapy: Secondary | ICD-10-CM | POA: Diagnosis not present

## 2016-07-29 MED ORDER — SODIUM CHLORIDE 0.9% FLUSH
10.0000 mL | INTRAVENOUS | Status: DC | PRN
  Administered 2016-07-29: 10 mL
  Filled 2016-07-29: qty 10

## 2016-07-29 MED ORDER — ALTEPLASE 2 MG IJ SOLR
2.0000 mg | Freq: Once | INTRAMUSCULAR | Status: DC | PRN
  Filled 2016-07-29: qty 2

## 2016-07-29 MED ORDER — SODIUM CHLORIDE 0.9 % IV SOLN
Freq: Once | INTRAVENOUS | Status: AC
  Administered 2016-07-29: 14:00:00 via INTRAVENOUS

## 2016-07-29 MED ORDER — ALRA NON-METALLIC DEODORANT (RAD-ONC)
1.0000 "application " | Freq: Once | TOPICAL | Status: AC
  Administered 2016-07-29: 1 via TOPICAL

## 2016-07-29 MED ORDER — DIPHENHYDRAMINE HCL 25 MG PO CAPS
ORAL_CAPSULE | ORAL | Status: AC
Start: 2016-07-29 — End: 2016-07-29
  Filled 2016-07-29: qty 1

## 2016-07-29 MED ORDER — ACETAMINOPHEN 325 MG PO TABS
ORAL_TABLET | ORAL | Status: AC
  Filled 2016-07-29: qty 2

## 2016-07-29 MED ORDER — SODIUM CHLORIDE 0.9 % IV SOLN
6.0000 mg/kg | Freq: Once | INTRAVENOUS | Status: AC
  Administered 2016-07-29: 504 mg via INTRAVENOUS
  Filled 2016-07-29: qty 24

## 2016-07-29 MED ORDER — DIPHENHYDRAMINE HCL 25 MG PO CAPS
25.0000 mg | ORAL_CAPSULE | Freq: Once | ORAL | Status: AC
  Administered 2016-07-29: 25 mg via ORAL

## 2016-07-29 MED ORDER — HEPARIN SOD (PORK) LOCK FLUSH 100 UNIT/ML IV SOLN
500.0000 [IU] | Freq: Once | INTRAVENOUS | Status: AC | PRN
  Administered 2016-07-29: 500 [IU]
  Filled 2016-07-29: qty 5

## 2016-07-29 MED ORDER — ACETAMINOPHEN 325 MG PO TABS
650.0000 mg | ORAL_TABLET | Freq: Once | ORAL | Status: AC
  Administered 2016-07-29: 650 mg via ORAL

## 2016-07-29 MED ORDER — RADIAPLEXRX EX GEL
Freq: Once | CUTANEOUS | Status: AC
  Administered 2016-07-29: 11:00:00 via TOPICAL

## 2016-07-29 NOTE — Telephone Encounter (Signed)
This RN accessed port with noted need to apply more pressure to outer rim due to tilt in positioning of port.  Flushed with 10 cc NS with no resistance and no noted discomfort.  No blood return noted and pt flushed with additional NS with repositioning with no discomfort or difficulty nor any visible changes in port site.  No blood return post above.  Cath Flo administered and site labeled appropriately.  Treatment room nurse notified.

## 2016-07-29 NOTE — Progress Notes (Signed)
Blood return via PAC noted, but not enough for lab draw.  Per Val, RN, labs not needed today.

## 2016-07-29 NOTE — Progress Notes (Signed)
Patient education done, my business card, Radiation  therapy and you book, radiaplex gel ,alra deodorant given, discussed ways to manage side effects,skin irritation, pain, fatigue,  Use of radiaplex bid after rad tx and either bedtime or if afternoon rad tys then again in am, just not 4 hours prior to rad txs, alra after rad tx and prn, luke warm water showers or baths, use of electric shaver only if shaving under arm, no under wire bras, dove soap unscented preferred, increase  protein in diet, increase water,fluids, stay hydrated, , no scrubbing or scratching, sees RN/MD weekly and as needed teach back given,  10:51 AM

## 2016-07-29 NOTE — Progress Notes (Signed)
[facility]  [phone] [phone]) [phone]     ID: [name] DOB: 25-Nov-1975  MR#: [id number]  ZHY#:[id number]  Patient Care Team: No Pcp Per Patient as PCP - General (General Practice) [name], MD as Consulting Physician (Oncology) [name], MD as Consulting Physician (General Surgery) [name], MD as Consulting Physician (Obstetrics and Gynecology) PCP: No PCP Per Patient OTHER MD:  CHIEF COMPLAINT: HER-2 positive, Estrogen receptor moderately positive breast cancer  CURRENT TREATMENT: anti-HER-2 immunotherapy; adjuvant radiation   BREAST CANCER HISTORY: From the original intake note:  Hend woke up the morning of 11/09/2015 with some pain in her left axilla. She examined herself and found a lump in her left breast. She saw her gynecologist the same day, and she confirms a lump. The patient then proceeded directly to mammography. I do not have that report. However it did show the mass. [name] was then scheduled for ultrasound-guided biopsy 11/21/2015. This showed ([place] [phone] at the [employer] [facility] [employer] [facility] school of medicine) and invasive ductal carcinoma, grade 2 estrogen receptor positive, with moderate intensity (10-50%), progesterone receptor negative, and HER-2 equivocal by immunohistochemistry. Fish was obtained and was equivocal as well, with a signals ratio of 1.8 to, the number per cell being 5.7.  On 12/05/2015 the patient had a CT/ angiogram of the chest for evaluation of her left-sided chest pain. This showed no evidence of a clot. The left breast nodule measured 1.3 cm on the study. There was no enlarged axillary adenopathy. There was also no evidence of lung metastasis or blastic or destructive lytic bone lesions. The upper abdomen was unremarkable.  With that information the patient presents for further evaluation and treatment.  INTERVAL HISTORY: [name] returns today for follow-up of her HER-2/neu  positive breast cancer. She is currently receiving Herceptin alone and she tolerates that well. She was just evaluated by Dr. Haroldine Laws in her heart is fine. He is planning one last echo sometime in mid April.  She started her radiation treatments yesterday. So far that is going well.   REVIEW OF SYSTEMS: For the past week she has felt unusually tired. She is not depressed. She tells me her husband's had a wonderful interview with the pelvic to centers office and temp of [place] and that they're hopeful regarding that. Her hair is coming back and, a little different but nevertheless coming back. She is exercising twice a week and physical therapy. She sleeps moderately well usually with Ambien. Despite all this she just hasn't had any energy for the past week. Aside from that a detailed review of systems today was stable  PAST MEDICAL HISTORY: Past Medical History:  Diagnosis Date  . Allergy   . Anxiety    has panic attacks  . Asthma    childhood, none now  . Breast cancer ([place]) 11/20/15   left breast  . Depression   . Endometriosis   . Family history of breast cancer   . GERD (gastroesophageal reflux disease)   . Herniated disc, cervical   . History of bronchitis as a child   . History of cancer chemotherapy    left breast  . History of stomach ulcers   . History of urinary tract infection   . Hormone disorder   . IBD (inflammatory bowel disease)   . Migraine without aura   . Neck pain   . PONV (postoperative nausea and vomiting)   . Shortness of breath dyspnea    anxiety related  PAST SURGICAL HISTORY: Past Surgical History:  Procedure Laterality Date  . ABDOMINAL HYSTERECTOMY    . laparoscopically assisted vaginal hysterectomy  01/2006   Still has ovaries  . LAPAROSCOPY     x 2, diagnosed with endometriosis (prior to hysterectomy), only treated medically.  Marland Kitchen MASTOPEXY Bilateral 05/20/2016   Procedure: MASTOPEXY;  Surgeon: Irene Limbo, MD;  Location: [facility];  Service: Plastics;  Laterality: Bilateral;  . PORTACATH PLACEMENT Right 12/25/2015   Procedure: INSERTION PORT-A-CATH WITH ULTRA SOUND;  Surgeon: [name], MD;  Location[facility];  Service: General;  Laterality: Right;  . RADIOACTIVE SEED GUIDED MASTECTOMY WITH AXILLARY SENTINEL LYMPH NODE BIOPSY Left 05/12/2016   Procedure: BREAST LUMPECTOMY WITH RADIOACTIVE SEED AND SENTINEL LYMPH NODE BIOPSY AND BLUE DYE INJECTION;  Surgeon: [name], MD;  Location: [facility];  Service: General;  Laterality: Left;    FAMILY HISTORY Family History  Problem Relation Age of Onset  . Sarcoidosis Mother   . Hypertension Father   . Throat cancer Maternal Grandfather     smoker and heavy drinker; dx in his late 79s-50s  . Cancer Paternal Grandmother     possible gatric vs bladder cancer  . Bladder Cancer Paternal Grandmother   . Breast cancer Paternal Aunt     dxin her 67s; dad's maternal half sister  . Breast cancer Other     PGFs mother  . Anesthesia problems Neg Hx   . Hypotension Neg Hx   . Malignant hyperthermia Neg Hx   . Pseudochol deficiency Neg Hx   The patient's parents are both living, in their early 80s. The patient has one brother and one sister. On the father's side 1 great grandmother was diagnosed with breast cancer in her 33s. The patient's father's mother was diagnosed with stomach cancer. One of the patient's father's sisters was diagnosed with breast cancer in her 5s. On the maternal side patient's mother's father was diagnosed with throat cancer at age.   GYNECOLOGIC HISTORY:  No LMP recorded. Patient has had a hysterectomy.  menarche age 57, first live birth age 31. The patient is GX P1. She stopped having periods in 2008, when she underwent a simple hysterectomy without salpingo-oophorectomy. She has a history of endometriosis and was started on Provera in March of this year, with her next dose due next week. (However she has decided to  forego further Provera treatments at least for now).   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 He is licensed in [place] but not in [place]. He is now in this area looking for a job. The patient's son [name] is a Ship broker in music at [employer]: Not in place   HEALTH MAINTENANCE: Social History  Substance Use Topics  . Smoking status: Never Smoker  . Smokeless tobacco: Never Used  . Alcohol use 0.0 oz/week     Comment: socially      Colonoscopy:  PAP:  Bone density:  Lipid panel:  Allergies  Allergen Reactions  . Amoxicillin Shortness Of Breath and Itching    Has patient had a PCN reaction causing immediate rash, facial/tongue/throat swelling, SOB or lightheadedness with hypotension: Yes Has patient had a PCN reaction causing severe rash involving mucus membranes or skin necrosis: No Has patient had a PCN reaction that required hospitalization  Yes Has patient had a PCN reaction occurring within the last 10 years: No If all of the above answers are "NO", then may proceed with Cephalosporin use.   Nestor Lewandowsky [Fluconazole] Nausea Only    heartburn    Current Outpatient Prescriptions  Medication Sig Dispense Refill  . hyaluronate sodium (RADIAPLEXRX) GEL Apply 1 application topically 2 (two) times daily.    . methocarbamol (ROBAXIN) 500 MG tablet Take 1 tablet (500 mg total) by mouth every 8 (eight) hours as needed for muscle spasms. 30 tablet 1  . [START ON 11/14/2583] non-metallic deodorant (ALRA) MISC Apply 1 application topically daily as needed.    . sertraline (ZOLOFT) 100 MG tablet Take 1 tablet (100 mg total) by mouth daily. 30 tablet 3  . traMADol (ULTRAM) 50 MG tablet Take 1 tablet (50 mg total) by mouth every 6 (six) hours as needed. 60 tablet 0  . zolpidem (AMBIEN) 10 MG tablet  Take 1/2 to 1 tab po qhs prn 30 tablet 1   No current facility-administered medications for this visit.    Facility-Administered Medications Ordered in Other Visits  Medication Dose Route Frequency Provider Last Rate Last Dose  . alteplase (CATHFLO ACTIVASE) injection 2 mg  2 mg Intracatheter Once PRN [name], MD        OBJECTIVEShea Stakes African-American woman in no acute distress  Vitals:   07/29/16 1257  BP: 134/81  Pulse: 81  Resp: 18  Temp: 98.6 F (37 C)     Body mass index is 34.43 kg/m.    ECOG FS:1 - Symptomatic but completely ambulatory Filed Weights   07/29/16 1257  Weight: 200 lb 9.6 oz (91 kg)    Sclerae unicteric, EOMs intact Oropharynx clear and moist No cervical or supraclavicular adenopathy Lungs no rales or rhonchi Heart regular rate and rhythm Abd soft, nontender, positive bowel sounds MSK no focal spinal tenderness, no upper extremity lymphedema Neuro: nonfocal, well oriented, appropriate affect Breasts: Status post bilateral reduction mammoplasties, with left lumpectomy. The cosmetic result is excellent. Both axillae are benign.  LAB RESULTS:  CMP     Component Value Date/Time   NA 138 07/08/2016 0924   K 3.6 07/08/2016 0924   CL 107 12/04/2015 1858   CO2 24 07/08/2016 0924   GLUCOSE 104 07/08/2016 0924   BUN 18.0 07/08/2016 0924   CREATININE 0.8 07/08/2016 0924   CALCIUM 9.2 07/08/2016 0924   PROT 7.1 07/08/2016 0924   ALBUMIN 3.5 07/08/2016 0924   AST 19 07/08/2016 0924   ALT 18 07/08/2016 0924   ALKPHOS 81 07/08/2016 0924   BILITOT 0.27 07/08/2016 0924   GFRNONAA >60 12/04/2015 1858   GFRAA >60 12/04/2015 1858    INo results found for: SPEP, UPEP  Lab Results  Component Value Date   WBC 6.2 07/08/2016   NEUTROABS 3.6 07/08/2016   HGB 10.7 (L) 07/08/2016   HCT 31.2 (L) 07/08/2016   MCV 93.2 07/08/2016   PLT 220 07/08/2016      Chemistry      Component Value Date/Time   NA 138 07/08/2016 0924   K 3.6  07/08/2016 0924   CL 107 12/04/2015 1858   CO2 24 07/08/2016 0924   BUN 18.0 07/08/2016 0924   CREATININE 0.8 07/08/2016 0924      Component Value Date/Time   CALCIUM 9.2 07/08/2016 0924   ALKPHOS 81 07/08/2016 0924   AST 19 07/08/2016 0924   ALT 18 07/08/2016 0924   BILITOT 0.27 07/08/2016 2778  No results found for: LABCA2  No components found for: FBPPH432  No results for input(s): INR in the last 168 hours.  Urinalysis    Component Value Date/Time   COLORURINE STRAW (A) 12/11/2008 1923   APPEARANCEUR CLEAR 12/11/2008 1923   LABSPEC 1.020 12/11/2008 1923   PHURINE 6.0 12/11/2008 1923   GLUCOSEU NEGATIVE 12/11/2008 1923   HGBUR NEGATIVE 12/11/2008 1923   BILIRUBINUR NEGATIVE 12/11/2008 1923   KETONESUR NEGATIVE 12/11/2008 1923   PROTEINUR NEGATIVE 12/11/2008 1923   UROBILINOGEN 0.2 12/11/2008 1923   NITRITE NEGATIVE 12/11/2008 1923   LEUKOCYTESUR  12/11/2008 1923    NEGATIVE MICROSCOPIC NOT DONE ON URINES WITH NEGATIVE PROTEIN, BLOOD, LEUKOCYTES, NITRITE, OR GLUCOSE <1000 mg/dL.      ELIGIBLE FOR AVAILABLE RESEARCH PROTOCOL: no  STUDIES: No results found.  ASSESSMENT: 41 y.o. [place] woman status post left breast upper outer quadrant biopsy 11/20/2015 for a clinical T1c N0, stage IA invasive ductal carcinoma, grade 2, estrogen receptor moderately positive, progesterone receptor negative, HER-2 equivocal by both immunohistochemistry and FISH  (1) neoadjuvant tamoxifen started 12/11/2015, stopped at the start of chemotherapy  (2) genetics testing 01/03/2016 through the Breast/Ovarian gene panel offered by [facility] found no deleterious mutations in ATM, BARD1, BRCA1, BRCA2, BRIP1, CDH1, CHEK2, EPCAM, FANCC, MLH1, MSH2, MSH6, NBN, PALB2, PMS2, PTEN, RAD51C, RAD51D, TP53, and XRCC2  (3) neoadjuvant chemotherapy consisting of carboplatin, docetaxel, trastuzumab and pertuzumab every 21 days 6 starting 01/01/2016, completed 04/15/2016  (4) trastuzumab to be  continued to complete a year (through June 2018)  (a echocardiogram 03/28/2016) Shows an ejection fraction of 60-65%.   (5) left lumpectomy and sentinel lymph node sampling 05/12/2016 showed a complete pathologic response (ypT0, ypN0)  (a) Status post bilateral mastopexy 05/20/2016  (6) adjuvant radiation In process  (7) anti-estrogens to be continued 5-10 years, more likely the latter    PLAN: [name] is feeling tired, but I don't have a good explanation for it. It could be a viral illness that very mild. It could be recovery from her surgery.  More likely I think is the transition she is undergoing. This is an emotional. Most of my patients, when they are done within more intensive therapy and are more or less co-staining. Of course she eventually will feel tired from the radiation but it is too soon for that.  We discussed finding a normal and the fact that it will be about 3 years for her to really settle into the person she is going to become. I warned her not to expect to be the same person she was before her sats not possible. This is important also for her to discuss with her husband.  I think she would be a good candidate for the finding normal group and I gave her the appropriate she will continue to receive trastuzumab every 3 weeks to complete a year, and I will see her with the March treatment. We will try to coordinate the therapy with the radiation treatments so that she does not have to spend half a day here waiting between 1 treatment on the other.  She knows to call for any problems that may develop before the next visit here.   [name], MD   07/29/2016 1:16 PM Medical Oncology and Hematology [facility] [address] [place], [place] [address] Tel. [phone]    Fax. [phone]

## 2016-07-29 NOTE — Patient Instructions (Signed)
[facility] Discharge Instructions for Patients Receiving Chemotherapy  Today you received the following chemotherapy agents herceptin   To help prevent nausea and vomiting after your treatment, we encourage you to take your nausea medication as directed   If you develop nausea and vomiting that is not controlled by your nausea medication, call the clinic.   BELOW ARE SYMPTOMS THAT SHOULD BE REPORTED IMMEDIATELY:  *FEVER GREATER THAN 100.5 F  *CHILLS WITH OR WITHOUT FEVER  NAUSEA AND VOMITING THAT IS NOT CONTROLLED WITH YOUR NAUSEA MEDICATION  *UNUSUAL SHORTNESS OF BREATH  *UNUSUAL BRUISING OR BLEEDING  TENDERNESS IN MOUTH AND THROAT WITH OR WITHOUT PRESENCE OF ULCERS  *URINARY PROBLEMS  *BOWEL PROBLEMS  UNUSUAL RASH Items with * indicate a potential emergency and should be followed up as soon as possible.  Feel free to call the clinic you have any questions or concerns. The clinic phone number is [phone].  

## 2016-07-30 ENCOUNTER — Ambulatory Visit
Admission: RE | Admit: 2016-07-30 | Discharge: 2016-07-30 | Disposition: A | Discharge status: Home / Self Care | Payer: BLUE CROSS/BLUE SHIELD | Source: Ambulatory Visit | Attending: Radiation Oncology | Admitting: Radiation Oncology

## 2016-07-30 ENCOUNTER — Ambulatory Visit: Payer: BLUE CROSS/BLUE SHIELD | Admitting: Physical Therapy

## 2016-07-30 DIAGNOSIS — M25512 Pain in left shoulder: Secondary | ICD-10-CM

## 2016-07-30 DIAGNOSIS — Z510 Encounter for antineoplastic radiation therapy: Secondary | ICD-10-CM | POA: Diagnosis not present

## 2016-07-30 DIAGNOSIS — M439 Deforming dorsopathy, unspecified: Secondary | ICD-10-CM

## 2016-07-30 DIAGNOSIS — M25612 Stiffness of left shoulder, not elsewhere classified: Secondary | ICD-10-CM | POA: Diagnosis not present
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
## 2016-07-30 NOTE — Patient Instructions (Signed)
EXTENSION: Standing - Resistance Band: Stable (Active)    Stand, right arm at side. Against yellow resistance band, draw arm backward, as far as possible, keeping elbow straight. Complete __2 _ sets of _10 __ repetitions. Perform 1___ sessions per day.  Copyright  VHI. All rights reserved.

## 2016-07-31 ENCOUNTER — Ambulatory Visit
Admission: RE | Admit: 2016-07-31 | Discharge: 2016-07-31 | Disposition: A | Discharge status: Home / Self Care | Payer: BLUE CROSS/BLUE SHIELD | Source: Ambulatory Visit | Attending: Radiation Oncology | Admitting: Radiation Oncology

## 2016-07-31 ENCOUNTER — Ambulatory Visit: Payer: BLUE CROSS/BLUE SHIELD | Admitting: Physical Therapy

## 2016-07-31 ENCOUNTER — Encounter: Payer: Self-pay | Admitting: Physical Therapy

## 2016-07-31 DIAGNOSIS — R293 Abnormal posture: Secondary | ICD-10-CM

## 2016-07-31 DIAGNOSIS — M25612 Stiffness of left shoulder, not elsewhere classified: Secondary | ICD-10-CM

## 2016-07-31 DIAGNOSIS — M25512 Pain in left shoulder: Secondary | ICD-10-CM

## 2016-07-31 DIAGNOSIS — Z510 Encounter for antineoplastic radiation therapy: Secondary | ICD-10-CM | POA: Diagnosis not present

## 2016-07-31 NOTE — Patient Instructions (Signed)
Rhomboid stretch: In sitting, reach with your left hand across to the outside of your right calf.  Gently pull up on the calf without bending your elbow. Hold 10 seconds Repeat 3 times, 2-3 times each day.
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
## 2016-08-01 ENCOUNTER — Ambulatory Visit
Admission: RE | Admit: 2016-08-01 | Discharge: 2016-08-01 | Disposition: A | Discharge status: Home / Self Care | Payer: BLUE CROSS/BLUE SHIELD | Source: Ambulatory Visit | Attending: Radiation Oncology | Admitting: Radiation Oncology

## 2016-08-01 ENCOUNTER — Encounter: Payer: Self-pay | Admitting: Radiation Oncology

## 2016-08-01 VITALS — BP 135/84 | HR 80 | Temp 98.0°F | Resp 20 | Wt 199.4 lb

## 2016-08-01 DIAGNOSIS — C50412 Malignant neoplasm of upper-outer quadrant of left female breast: Secondary | ICD-10-CM

## 2016-08-01 DIAGNOSIS — Z510 Encounter for antineoplastic radiation therapy: Secondary | ICD-10-CM | POA: Diagnosis not present

## 2016-08-01 DIAGNOSIS — Z170 Estrogen receptor positive status [ER+]: Secondary | ICD-10-CM

## 2016-08-01 NOTE — Progress Notes (Signed)
   Department of Radiation Oncology  Phone:  [phone] Fax:        [phone]  Weekly Treatment Note    Name: [name] Date: 08/01/2016 MRN: [id number] DOB: 13-Aug-1975   Diagnosis:     ICD-9-CM ICD-10-CM   1. Malignant neoplasm of upper-outer quadrant of left breast in female, estrogen receptor positive ([place]) 174.4 C50.412    V86.0 Z17.0      Current dose: 9 Gy  Current fraction:5   MEDICATIONS: Current Outpatient Prescriptions  Medication Sig Dispense Refill  . hyaluronate sodium (RADIAPLEXRX) GEL Apply 1 application topically 2 (two) times daily.    . methocarbamol (ROBAXIN) 500 MG tablet Take 1 tablet (500 mg total) by mouth every 8 (eight) hours as needed for muscle spasms. 30 tablet 1  . non-metallic deodorant (ALRA) MISC Apply 1 application topically daily as needed.    . sertraline (ZOLOFT) 100 MG tablet Take 1 tablet (100 mg total) by mouth daily. 30 tablet 3  . traMADol (ULTRAM) 50 MG tablet Take 1 tablet (50 mg total) by mouth every 6 (six) hours as needed. 60 tablet 0  . zolpidem (AMBIEN) 10 MG tablet Take 1/2 to 1 tab po qhs prn 30 tablet 1   No current facility-administered medications for this encounter.      ALLERGIES: Amoxicillin and Diflucan [fluconazole]   LABORATORY DATA:  Lab Results  Component Value Date   WBC 6.2 07/08/2016   HGB 10.7 (L) 07/08/2016   HCT 31.2 (L) 07/08/2016   MCV 93.2 07/08/2016   PLT 220 07/08/2016   Lab Results  Component Value Date   NA 138 07/08/2016   K 3.6 07/08/2016   CL 107 12/04/2015   CO2 24 07/08/2016   Lab Results  Component Value Date   ALT 18 07/08/2016   AST 19 07/08/2016   ALKPHOS 81 07/08/2016   BILITOT 0.27 07/08/2016     NARRATIVE: [name] was seen today for weekly treatment management. The chart was checked and the patient's films were reviewed.  The patient is doing very well after her first week of treatment. No difficulties so far. The patient has begun using skin  cream. All of her questions were answered today.  PHYSICAL EXAMINATION: weight is 199 lb 6.4 oz (90.4 kg). Her oral temperature is 98 F (36.7 C). Her blood pressure is 135/84 and her pulse is 80. Her respiration is 20.        ASSESSMENT: The patient is doing satisfactorily with treatment.  PLAN: We will continue with the patient's radiation treatment as planned.

## 2016-08-01 NOTE — Progress Notes (Signed)
Weekly rad txs left breast 5/33 completed, no skin changes, radiaplex used bid, skin intact; no pain, appetite good,no fatigue 3:34 PM BP 135/84 (BP Location: Right Arm, Patient Position: Sitting, Cuff Size: Large)   Pulse 80   Temp 98 F (36.7 C) (Oral)   Resp 20   Wt 199 lb 6.4 oz (90.4 kg)   BMI 34.23 kg/m   Wt Readings from Last 3 Encounters:  08/01/16 199 lb 6.4 oz (90.4 kg)  07/29/16 200 lb 9.6 oz (91 kg)  07/18/16 197 lb (89.4 kg)

## 2016-08-04 ENCOUNTER — Ambulatory Visit
Admission: RE | Admit: 2016-08-04 | Discharge: 2016-08-04 | Disposition: A | Discharge status: Home / Self Care | Payer: BLUE CROSS/BLUE SHIELD | Source: Ambulatory Visit | Attending: Radiation Oncology | Admitting: Radiation Oncology

## 2016-08-04 ENCOUNTER — Ambulatory Visit: Payer: BLUE CROSS/BLUE SHIELD | Admitting: Physical Therapy

## 2016-08-04 DIAGNOSIS — Z510 Encounter for antineoplastic radiation therapy: Secondary | ICD-10-CM | POA: Diagnosis not present

## 2016-08-05 ENCOUNTER — Ambulatory Visit: Payer: BLUE CROSS/BLUE SHIELD

## 2016-08-05 ENCOUNTER — Ambulatory Visit
Admission: RE | Admit: 2016-08-05 | Discharge: 2016-08-05 | Disposition: A | Discharge status: Home / Self Care | Payer: BLUE CROSS/BLUE SHIELD | Source: Ambulatory Visit | Attending: Radiation Oncology | Admitting: Radiation Oncology

## 2016-08-05 DIAGNOSIS — M25612 Stiffness of left shoulder, not elsewhere classified: Secondary | ICD-10-CM | POA: Diagnosis not present

## 2016-08-05 DIAGNOSIS — R293 Abnormal posture: Secondary | ICD-10-CM

## 2016-08-05 DIAGNOSIS — Z510 Encounter for antineoplastic radiation therapy: Secondary | ICD-10-CM | POA: Diagnosis not present

## 2016-08-05 DIAGNOSIS — M25512 Pain in left shoulder: Secondary | ICD-10-CM
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
## 2016-08-06 ENCOUNTER — Ambulatory Visit: Payer: BLUE CROSS/BLUE SHIELD | Admitting: Physical Therapy

## 2016-08-06 ENCOUNTER — Ambulatory Visit
Admission: RE | Admit: 2016-08-06 | Discharge: 2016-08-06 | Disposition: A | Discharge status: Home / Self Care | Payer: BLUE CROSS/BLUE SHIELD | Source: Ambulatory Visit | Attending: Radiation Oncology | Admitting: Radiation Oncology

## 2016-08-06 DIAGNOSIS — Z510 Encounter for antineoplastic radiation therapy: Secondary | ICD-10-CM | POA: Diagnosis not present

## 2016-08-07 ENCOUNTER — Ambulatory Visit
Admission: RE | Admit: 2016-08-07 | Discharge: 2016-08-07 | Disposition: A | Discharge status: Home / Self Care | Payer: BLUE CROSS/BLUE SHIELD | Source: Ambulatory Visit | Attending: Radiation Oncology | Admitting: Radiation Oncology

## 2016-08-07 DIAGNOSIS — Z510 Encounter for antineoplastic radiation therapy: Secondary | ICD-10-CM | POA: Diagnosis not present

## 2016-08-08 ENCOUNTER — Ambulatory Visit
Admission: RE | Admit: 2016-08-08 | Discharge: 2016-08-08 | Disposition: A | Discharge status: Home / Self Care | Payer: BLUE CROSS/BLUE SHIELD | Source: Ambulatory Visit | Attending: Radiation Oncology | Admitting: Radiation Oncology

## 2016-08-08 ENCOUNTER — Ambulatory Visit (HOSPITAL_BASED_OUTPATIENT_CLINIC_OR_DEPARTMENT_OTHER): Payer: BLUE CROSS/BLUE SHIELD | Admitting: Adult Health

## 2016-08-08 ENCOUNTER — Encounter: Payer: Self-pay | Admitting: Radiation Oncology

## 2016-08-08 ENCOUNTER — Encounter: Payer: Self-pay | Admitting: Adult Health

## 2016-08-08 VITALS — BP 133/86 | HR 85 | Temp 97.6°F | Resp 18 | Ht 64.0 in | Wt 200.6 lb

## 2016-08-08 VITALS — BP 120/86 | HR 79 | Temp 97.9°F | Resp 16 | Wt 200.8 lb

## 2016-08-08 DIAGNOSIS — K2100 Gastro-esophageal reflux disease with esophagitis, without bleeding: Secondary | ICD-10-CM

## 2016-08-08 DIAGNOSIS — Z170 Estrogen receptor positive status [ER+]: Secondary | ICD-10-CM

## 2016-08-08 DIAGNOSIS — C50412 Malignant neoplasm of upper-outer quadrant of left female breast: Secondary | ICD-10-CM

## 2016-08-08 DIAGNOSIS — R1310 Dysphagia, unspecified: Secondary | ICD-10-CM

## 2016-08-08 DIAGNOSIS — R1319 Other dysphagia: Secondary | ICD-10-CM

## 2016-08-08 DIAGNOSIS — Z510 Encounter for antineoplastic radiation therapy: Secondary | ICD-10-CM | POA: Diagnosis not present

## 2016-08-08 MED ORDER — PANTOPRAZOLE SODIUM 40 MG PO TBEC: 40.0000 mg | DELAYED_RELEASE_TABLET | Freq: Every day | ORAL | 5 refills | Status: DC

## 2016-08-08 NOTE — Progress Notes (Signed)
[facility]  [phone] [phone]     ID: [name] DOB: 11-09-1975  MR#: [id number]  JOI#:[id number]  Patient Care Team: No Pcp Per Patient as PCP - General (General Practice) Jordan Cruel, MD as Consulting Physician (Oncology) Jordan Bookbinder, MD as Consulting Physician (General Surgery) Jordan Dom, MD as Consulting Physician (Obstetrics and Gynecology) PCP: No PCP Per Patient OTHER MD:  CHIEF COMPLAINT: HER-2 positive, Estrogen receptor moderately positive breast cancer  CURRENT TREATMENT: anti-HER-2 immunotherapy; adjuvant radiation   BREAST CANCER HISTORY: From the original intake note:  [name] woke up the morning of 11/09/2015 with some pain in her left axilla. She examined herself and found a lump in her left breast. She saw her gynecologist the same day, and she confirms a lump. The patient then proceeded directly to mammography. I do not have that report. However it did show the mass. [name] was then scheduled for ultrasound-guided biopsy 11/21/2015. This showed ([place] [phone] at the [employer] [facility] [employer] [facility] [employer]) and invasive ductal carcinoma, grade 2 estrogen receptor positive, with moderate intensity (10-50%), progesterone receptor negative, and HER-2 equivocal by immunohistochemistry. Fish was obtained and was equivocal as well, with a signals ratio of 1.8 to, the number per cell being 5.7.  On 12/05/2015 the patient had a CT/ angiogram of the chest for evaluation of her left-sided chest pain. This showed no evidence of a clot. The left breast nodule measured 1.3 cm on the study. There was no enlarged axillary adenopathy. There was also no evidence of lung metastasis or blastic or destructive lytic bone lesions. The upper abdomen was unremarkable.  With that information the patient presents for further evaluation and treatment.  INTERVAL HISTORY: [name] returns today for follow-up of her HER-2/neu  positive breast cancer. She is currently receiving Herceptin alone and she tolerates that well. She is concerned today regarding pain across her abdomen, GERD that isn't relieved by any otc medications with no relief.  She has gotten to the point where she has the sensation that food gets stuck with swallowing.  She also has a cough at night. She denies DOE, and her last echo and Dr. Haroldine [name] appointment was in mid January, 2018.  Denies nausea,vomiting, constipation, diarrhea, hematochezia, melena.  REVIEW OF SYSTEMS: A complete 14 point ros was reviewed and is negative other than what is mentioned above.  PAST MEDICAL HISTORY: Past Medical History:  Diagnosis Date  . Allergy   . Anxiety    has panic attacks  . Asthma    childhood, none now  . Breast cancer ([place]) 11/20/15   left breast  . Depression   . Endometriosis   . Family history of breast cancer   . GERD (gastroesophageal reflux disease)   . Herniated disc, cervical   . History of bronchitis as a child   . History of cancer chemotherapy    left breast  . History of stomach ulcers   . History of urinary tract infection   . Hormone disorder   . IBD (inflammatory bowel disease)   . Migraine without aura   . Neck pain   . PONV (postoperative nausea and vomiting)   . Shortness of breath dyspnea    anxiety related    PAST SURGICAL HISTORY: Past Surgical History:  Procedure Laterality Date  . ABDOMINAL HYSTERECTOMY    . laparoscopically assisted vaginal hysterectomy  01/2006   Still has ovaries  . LAPAROSCOPY     x 2, diagnosed with  endometriosis (prior to hysterectomy), only treated medically.  Marland Kitchen MASTOPEXY Bilateral 05/20/2016   Procedure: MASTOPEXY;  Surgeon: Jordan Limbo, MD;  Location: [facility];  Service: Plastics;  Laterality: Bilateral;  . PORTACATH PLACEMENT Right 12/25/2015   Procedure: INSERTION PORT-A-CATH WITH ULTRA SOUND;  Surgeon: Jordan Bookbinder, MD;  Location: [facility];  Service:  General;  Laterality: Right;  . RADIOACTIVE SEED GUIDED MASTECTOMY WITH AXILLARY SENTINEL LYMPH NODE BIOPSY Left 05/12/2016   Procedure: BREAST LUMPECTOMY WITH RADIOACTIVE SEED AND SENTINEL LYMPH NODE BIOPSY AND BLUE DYE INJECTION;  Surgeon: Jordan Bookbinder, MD;  Location: [facility];  Service: General;  Laterality: Left;    FAMILY HISTORY Family History  Problem Relation Age of Onset  . Sarcoidosis Mother   . Hypertension Father   . Throat cancer Maternal Grandfather     smoker and heavy drinker; dx in his late 44s-50s  . Cancer Paternal Grandmother     possible gatric vs bladder cancer  . Bladder Cancer Paternal Grandmother   . Breast cancer Paternal Aunt     dxin her 51s; dad's maternal half sister  . Breast cancer Other     PGFs mother  . Anesthesia problems Neg Hx   . Hypotension Neg Hx   . Malignant hyperthermia Neg Hx   . Pseudochol deficiency Neg Hx   The patient's parents are both living, in their early 18s. The patient has one brother and one sister. On the father's side 1 great grandmother was diagnosed with breast cancer in her 58s. The patient's father's mother was diagnosed with stomach cancer. One of the patient's father's sisters was diagnosed with breast cancer in her 4s. On the maternal side patient's mother's father was diagnosed with throat cancer at age.   GYNECOLOGIC HISTORY:  No LMP recorded. Patient has had a hysterectomy.  menarche age 28, first live birth age 75. The patient is GX P1. She stopped having periods in 2008, when she underwent a simple hysterectomy without salpingo-oophorectomy. She has a history of endometriosis and was started on Provera in March of this year, with her next dose due next week. (However she has decided to forego further Provera treatments at least for now).   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 He is licensed in [place] but not in [place]. He is now in this area looking for a job. The patient's son [name] is a Ship broker in music at [employer]: Not in place   HEALTH MAINTENANCE: Social History  Substance Use Topics  . Smoking status: Never Smoker  . Smokeless tobacco: Never Used  . Alcohol use 0.0 oz/week     Comment: socially      Colonoscopy:  PAP:  Bone density:  Lipid panel:  Allergies  Allergen Reactions  . Amoxicillin Shortness Of Breath and Itching    Has patient had a PCN reaction causing immediate rash, facial/tongue/throat swelling, SOB or lightheadedness with hypotension: Yes Has patient had a PCN reaction causing severe rash involving mucus membranes or skin necrosis: No Has patient had a PCN reaction that required hospitalization Yes Has patient had a PCN reaction occurring within the last 10 years: No If all of the above answers are "NO", then may proceed with Cephalosporin use.   . Diflucan [Fluconazole] Nausea Only    heartburn  Current Outpatient Prescriptions  Medication Sig Dispense Refill  . hyaluronate sodium (RADIAPLEXRX) GEL Apply 1 application topically 2 (two) times daily.    . methocarbamol (ROBAXIN) 500 MG tablet Take 1 tablet (500 mg total) by mouth every 8 (eight) hours as needed for muscle spasms. 30 tablet 1  . non-metallic deodorant (ALRA) MISC Apply 1 application topically daily as needed.    . sertraline (ZOLOFT) 100 MG tablet Take 1 tablet (100 mg total) by mouth daily. 30 tablet 3  . traMADol (ULTRAM) 50 MG tablet Take 1 tablet (50 mg total) by mouth every 6 (six) hours as needed. 60 tablet 0  . zolpidem (AMBIEN) 10 MG tablet Take 1/2 to 1 tab po qhs prn 30 tablet 1  . pantoprazole (PROTONIX) 40 MG tablet Take 1 tablet (40 mg total) by mouth daily. 30 tablet 5   No current facility-administered medications for this visit.     OBJECTIVE:  Young-appearing African-American woman in no acute distress  Vitals:   08/08/16 1433  BP: 133/86  Pulse: 85  Resp: 18  Temp: 97.6 F (36.4 C)     Body mass index is 34.43 kg/m.    ECOG FS:1 - Symptomatic but completely ambulatory Filed Weights   08/08/16 1433  Weight: 200 lb 9.6 oz (91 kg)    GENERAL: Patient is a well appearing female in no acute distress HEENT:  Sclerae anicteric.  Oropharynx clear and moist. No ulcerations or evidence of oropharyngeal candidiasis. Neck is supple.  NODES:  No cervical, supraclavicular, or axillary lymphadenopathy palpated.  BREAST EXAM:  Deferred. LUNGS:  Clear to auscultation bilaterally.  No wheezes or rhonchi. HEART:  Regular rate and rhythm. No murmur appreciated. ABDOMEN:  Soft, nontender.  Positive, normoactive bowel sounds. No organomegaly palpated. MSK:  No focal spinal tenderness to palpation. Full range of motion bilaterally in the upper extremities. EXTREMITIES:  No peripheral edema.   SKIN:  Clear with no obvious rashes or skin changes. No nail dyscrasia. NEURO:  Nonfocal. Well oriented.  Appropriate affect.  Marland Kitchen  LAB RESULTS:  CMP     Component Value Date/Time   NA 138 07/08/2016 0924   K 3.6 07/08/2016 0924   CL 107 12/04/2015 1858   CO2 24 07/08/2016 0924   GLUCOSE 104 07/08/2016 0924   BUN 18.0 07/08/2016 0924   CREATININE 0.8 07/08/2016 0924   CALCIUM 9.2 07/08/2016 0924   PROT 7.1 07/08/2016 0924   ALBUMIN 3.5 07/08/2016 0924   AST 19 07/08/2016 0924   ALT 18 07/08/2016 0924   ALKPHOS 81 07/08/2016 0924   BILITOT 0.27 07/08/2016 0924   GFRNONAA >60 12/04/2015 1858   GFRAA >60 12/04/2015 1858    INo results found for: SPEP, UPEP  Lab Results  Component Value Date   WBC 6.2 07/08/2016   NEUTROABS 3.6 07/08/2016   HGB 10.7 (L) 07/08/2016   HCT 31.2 (L) 07/08/2016   MCV 93.2 07/08/2016   PLT 220 07/08/2016      Chemistry      Component Value Date/Time   NA 138 07/08/2016 0924   K 3.6 07/08/2016 0924    CL 107 12/04/2015 1858   CO2 24 07/08/2016 0924   BUN 18.0 07/08/2016 0924   CREATININE 0.8 07/08/2016 0924      Component Value Date/Time   CALCIUM 9.2 07/08/2016 0924   ALKPHOS 81 07/08/2016 0924   AST 19 07/08/2016 0924   ALT 18 07/08/2016 0924   BILITOT 0.27 07/08/2016 8338  No results found for: LABCA2  No components found for: [id number]  No results for input(s): INR in the last 168 hours.  Urinalysis    Component Value Date/Time   COLORURINE STRAW (A) 12/11/2008 1923   APPEARANCEUR CLEAR 12/11/2008 1923   LABSPEC 1.020 12/11/2008 1923   PHURINE 6.0 12/11/2008 1923   GLUCOSEU NEGATIVE 12/11/2008 1923   HGBUR NEGATIVE 12/11/2008 1923   BILIRUBINUR NEGATIVE 12/11/2008 1923   KETONESUR NEGATIVE 12/11/2008 1923   PROTEINUR NEGATIVE 12/11/2008 1923   UROBILINOGEN 0.2 12/11/2008 1923   NITRITE NEGATIVE 12/11/2008 1923   LEUKOCYTESUR  12/11/2008 1923    NEGATIVE MICROSCOPIC NOT DONE ON URINES WITH NEGATIVE PROTEIN, BLOOD, LEUKOCYTES, NITRITE, OR GLUCOSE <1000 mg/dL.      ELIGIBLE FOR AVAILABLE RESEARCH PROTOCOL: no  STUDIES: No results found.  ASSESSMENT: 41 y.o. [place] woman status post left breast upper outer quadrant biopsy 11/20/2015 for a clinical T1c N0, stage IA invasive ductal carcinoma, grade 2, estrogen receptor moderately positive, progesterone receptor negative, HER-2 equivocal by both immunohistochemistry and FISH  (1) neoadjuvant tamoxifen started 12/11/2015, stopped at the start of chemotherapy  (2) genetics testing 01/03/2016 through the Breast/Ovarian gene panel offered by [facility] found no deleterious mutations in ATM, BARD1, BRCA1, BRCA2, BRIP1, CDH1, CHEK2, EPCAM, FANCC, MLH1, MSH2, MSH6, NBN, PALB2, PMS2, PTEN, RAD51C, RAD51D, TP53, and XRCC2  (3) neoadjuvant chemotherapy consisting of carboplatin, docetaxel, trastuzumab and pertuzumab every 21 days 6 starting 01/01/2016, completed 04/15/2016  (4) trastuzumab to be continued to complete  a year (through June 2018)  (a echocardiogram 07/24/2016) Shows an ejection fraction of 60-65%.   (5) left lumpectomy and sentinel lymph node sampling 05/12/2016 showed a complete pathologic response (ypT0, ypN0)  (a) Status post bilateral mastopexy 05/20/2016  (6) adjuvant radiation In process  (7) anti-estrogens to be continued 5-10 years, more likely the latter    PLAN:  I started Jasiel on Protonix to take first thing in the morning on an empty stomach.  She will also need a barium swallow.  I gave her my number and she will call me next week if not better or if any worse, we may need to do CT scan, but will wait until we see how this goes.  I will see her on 2/13 while she is in treatment room as well.      Charlestine Massed, NP   08/08/2016 4:31 PM Medical Oncology and Hematology [facility] [address] [place], [place] [address] Tel. [phone]    Fax. [phone]

## 2016-08-08 NOTE — Progress Notes (Signed)
   Department of Radiation Oncology  Phone:  [phone] Fax:        [phone]  Weekly Treatment Note    Name: [name] Date: 08/08/2016 MRN: [id number] DOB: [birth year]   Diagnosis:     ICD-9-CM ICD-10-CM   1. Malignant neoplasm of upper-outer quadrant of left breast in female, estrogen receptor positive ([place]) 174.4 C50.412    V86.0 Z17.0      Current dose: 18 Gy  Current fraction: 10   MEDICATIONS: Current Outpatient Prescriptions  Medication Sig Dispense Refill  . hyaluronate sodium (RADIAPLEXRX) GEL Apply 1 application topically 2 (two) times daily.    . methocarbamol (ROBAXIN) 500 MG tablet Take 1 tablet (500 mg total) by mouth every 8 (eight) hours as needed for muscle spasms. 30 tablet 1  . non-metallic deodorant (ALRA) MISC Apply 1 application topically daily as needed.    . pantoprazole (PROTONIX) 40 MG tablet Take 1 tablet (40 mg total) by mouth daily. 30 tablet 5  . sertraline (ZOLOFT) 100 MG tablet Take 1 tablet (100 mg total) by mouth daily. 30 tablet 3  . traMADol (ULTRAM) 50 MG tablet Take 1 tablet (50 mg total) by mouth every 6 (six) hours as needed. 60 tablet 0  . zolpidem (AMBIEN) 10 MG tablet Take 1/2 to 1 tab po qhs prn 30 tablet 1   No current facility-administered medications for this encounter.      ALLERGIES: Amoxicillin and Diflucan [fluconazole]   LABORATORY DATA:  Lab Results  Component Value Date   WBC 6.2 07/08/2016   HGB 10.7 (L) 07/08/2016   HCT 31.2 (L) 07/08/2016   MCV 93.2 07/08/2016   PLT 220 07/08/2016   Lab Results  Component Value Date   NA 138 07/08/2016   K 3.6 07/08/2016   CL 107 12/04/2015   CO2 24 07/08/2016   Lab Results  Component Value Date   ALT 18 07/08/2016   AST 19 07/08/2016   ALKPHOS 81 07/08/2016   BILITOT 0.27 07/08/2016     NARRATIVE: [name] was seen today for weekly treatment management. The chart was checked and the patient's films were reviewed.  The patient states he  has had some tightness in the breast area but otherwise doing quite well. She is using skin cream daily twice a day.  Weekly rad txs left breast, mild erythema and slight tanning, some irritation stated under inframmary fold, skin intact, uses radaiplex bid, no nfatigue, appetite good, to start protonix for acid reflux 3:39 PM BP 120/86 (BP Location: Right Arm, Patient Position: Sitting, Cuff Size: Large)   Pulse 79   Temp 97.9 F (36.6 C) (Oral)   Resp 16   Wt 200 lb 12.8 oz (91.1 kg)   BMI 34.47 kg/m   Wt Readings from Last 3 Encounters:  08/08/16 200 lb 12.8 oz (91.1 kg)  08/08/16 200 lb 9.6 oz (91 kg)  08/01/16 199 lb 6.4 oz (90.4 kg)    PHYSICAL EXAMINATION: weight is 200 lb 12.8 oz (91.1 kg). Her oral temperature is 97.9 F (36.6 C). Her blood pressure is 120/86 and her pulse is 79. Her respiration is 16.      Mild hyperpigmentation in the treatment area.  ASSESSMENT: The patient is doing satisfactorily with treatment.  PLAN: We will continue with the patient's radiation treatment as planned.

## 2016-08-08 NOTE — Progress Notes (Signed)
Weekly rad txs left breast, mild erythema and slight tanning, some irritation stated under inframmary fold, skin intact, uses radaiplex bid, no nfatigue, appetite good, to start protonix for acid reflux 3:31 PM BP 120/86 (BP Location: Right Arm, Patient Position: Sitting, Cuff Size: Large)   Pulse 79   Temp 97.9 F (36.6 C) (Oral)   Resp 16   Wt 200 lb 12.8 oz (91.1 kg)   BMI 34.47 kg/m   Wt Readings from Last 3 Encounters:  08/08/16 200 lb 12.8 oz (91.1 kg)  08/08/16 200 lb 9.6 oz (91 kg)  08/01/16 199 lb 6.4 oz (90.4 kg)

## 2016-08-11 ENCOUNTER — Ambulatory Visit (HOSPITAL_COMMUNITY)
Admission: RE | Admit: 2016-08-11 | Discharge: 2016-08-11 | Disposition: A | Discharge status: Home / Self Care | Payer: BLUE CROSS/BLUE SHIELD | Source: Ambulatory Visit | Attending: Adult Health | Admitting: Adult Health

## 2016-08-11 ENCOUNTER — Ambulatory Visit
Admission: RE | Admit: 2016-08-11 | Discharge: 2016-08-11 | Disposition: A | Discharge status: Home / Self Care | Payer: BLUE CROSS/BLUE SHIELD | Source: Ambulatory Visit | Attending: Radiation Oncology | Admitting: Radiation Oncology

## 2016-08-11 ENCOUNTER — Ambulatory Visit (HOSPITAL_BASED_OUTPATIENT_CLINIC_OR_DEPARTMENT_OTHER): Payer: BLUE CROSS/BLUE SHIELD | Admitting: Adult Health

## 2016-08-11 ENCOUNTER — Telehealth: Payer: Self-pay | Admitting: Adult Health

## 2016-08-11 ENCOUNTER — Encounter: Payer: Self-pay | Admitting: Adult Health

## 2016-08-11 VITALS — BP 130/77 | HR 83 | Temp 98.1°F | Resp 18 | Ht 64.0 in | Wt 202.4 lb

## 2016-08-11 DIAGNOSIS — M549 Dorsalgia, unspecified: Secondary | ICD-10-CM

## 2016-08-11 DIAGNOSIS — M546 Pain in thoracic spine: Secondary | ICD-10-CM | POA: Diagnosis present

## 2016-08-11 DIAGNOSIS — Z170 Estrogen receptor positive status [ER+]: Secondary | ICD-10-CM

## 2016-08-11 DIAGNOSIS — M542 Cervicalgia: Secondary | ICD-10-CM | POA: Diagnosis not present

## 2016-08-11 DIAGNOSIS — C50412 Malignant neoplasm of upper-outer quadrant of left female breast: Secondary | ICD-10-CM | POA: Diagnosis not present

## 2016-08-11 DIAGNOSIS — M545 Low back pain: Secondary | ICD-10-CM | POA: Insufficient documentation

## 2016-08-11 DIAGNOSIS — N200 Calculus of kidney: Secondary | ICD-10-CM | POA: Insufficient documentation

## 2016-08-11 DIAGNOSIS — Z510 Encounter for antineoplastic radiation therapy: Secondary | ICD-10-CM | POA: Diagnosis not present

## 2016-08-11 MED ORDER — DICLOFENAC SODIUM 50 MG PO TBEC: 50.0000 mg | DELAYED_RELEASE_TABLET | Freq: Two times a day (BID) | ORAL | 0 refills | Status: DC

## 2016-08-11 MED ORDER — TRAMADOL HCL 50 MG PO TABS: 50.0000 mg | ORAL_TABLET | Freq: Four times a day (QID) | ORAL | 0 refills | Status: DC | PRN

## 2016-08-11 MED ORDER — METHOCARBAMOL 500 MG PO TABS: 500.0000 mg | ORAL_TABLET | Freq: Three times a day (TID) | ORAL | 1 refills | Status: DC | PRN

## 2016-08-11 NOTE — Progress Notes (Signed)
[facility]  [phone] [phone]) [phone]     ID: [name] DOB: Oct 08, 1975  MR#: [id number]  HFS#:[id number]  Patient Care Team: No Pcp Per Patient as PCP - General (General Practice) [name], MD as Consulting Physician (Oncology) [name], MD as Consulting Physician (General Surgery) [name], MD as Consulting Physician (Obstetrics and Gynecology) PCP: No PCP Per Patient OTHER MD:  CHIEF COMPLAINT: HER-2 positive, Estrogen receptor moderately positive breast cancer  CURRENT TREATMENT: anti-HER-2 immunotherapy; adjuvant radiation   BREAST CANCER HISTORY: From the original intake note:  [name] woke up the morning of 11/09/2015 with some pain in her left axilla. She examined herself and found a lump in her left breast. She saw her gynecologist the same day, and she confirms a lump. The patient then proceeded directly to mammography. I do not have that report. However it did show the mass. [name] was then scheduled for ultrasound-guided biopsy 11/21/2015. This showed ([place] [phone] at the [facility] school of medicine) and invasive ductal carcinoma, grade 2 estrogen receptor positive, with moderate intensity (10-50%), progesterone receptor negative, and HER-2 equivocal by immunohistochemistry. Fish was obtained and was equivocal as well, with a signals ratio of 1.8 to, the number per cell being 5.7.  On 12/05/2015 the patient had a CT/ angiogram of the chest for evaluation of her left-sided chest pain. This showed no evidence of a clot. The left breast nodule measured 1.3 cm on the study. There was no enlarged axillary adenopathy. There was also no evidence of lung metastasis or blastic or destructive lytic bone lesions. The upper abdomen was unremarkable.  With that information the patient presents for further evaluation and treatment.  INTERVAL HISTORY: [name] returns today for evaluation of pain in her  neck.  It is located about C7 of her spine.  She says it radiates downward into her spine. The pain is constant and varies in severity with no spcific pattern.  There is no precipitating factor.  She says it improves with sitting straight and worsens with movement.  Tramadol and Robaxin help the pain but do not take it away completely.    She also has a mild pressure in her chest and continues to have difficulty with reflux, regurgitating food, and dysphagia.  She says that she has started taking the protonix and it has helped some.  She continues to await scheduling of the barium swallow ordered Friday.    REVIEW OF SYSTEMS: A complete 14 point ros was reviewed and is negative other than what is mentioned above.  PAST MEDICAL HISTORY: Past Medical History:  Diagnosis Date  . Allergy   . Anxiety    has panic attacks  . Asthma    childhood, none now  . Breast cancer ([place]) 11/20/15   left breast  . Depression   . Endometriosis   . Family history of breast cancer   . GERD (gastroesophageal reflux disease)   . Herniated disc, cervical   . History of bronchitis as a child   . History of cancer chemotherapy    left breast  . History of stomach ulcers   . History of urinary tract infection   . Hormone disorder   . IBD (inflammatory bowel disease)   . Migraine without aura   . Neck pain   . PONV (postoperative nausea and vomiting)   . Shortness of breath dyspnea    anxiety related    PAST SURGICAL HISTORY: Past Surgical History:  Procedure Laterality Date  . ABDOMINAL HYSTERECTOMY    . laparoscopically assisted vaginal hysterectomy  01/2006   Still has ovaries  . LAPAROSCOPY     x 2, diagnosed with endometriosis (prior to hysterectomy), only treated medically.  [name] Kitchen MASTOPEXY Bilateral 05/20/2016   Procedure: MASTOPEXY;  Surgeon: Irene Limbo, MD;  Location: [facility];  Service: Plastics;  Laterality: Bilateral;  . PORTACATH PLACEMENT Right 12/25/2015    Procedure: INSERTION PORT-A-CATH WITH ULTRA SOUND;  Surgeon: [name], MD;  Location: [facility];  Service: General;  Laterality: Right;  . RADIOACTIVE SEED GUIDED MASTECTOMY WITH AXILLARY SENTINEL LYMPH NODE BIOPSY Left 05/12/2016   Procedure: BREAST LUMPECTOMY WITH RADIOACTIVE SEED AND SENTINEL LYMPH NODE BIOPSY AND BLUE DYE INJECTION;  Surgeon: [name], MD;  Location: [facility];  Service: General;  Laterality: Left;    FAMILY HISTORY Family History  Problem Relation Age of Onset  . Sarcoidosis Mother   . Hypertension Father   . Throat cancer Maternal Grandfather     smoker and heavy drinker; dx in his late 52s-50s  . Cancer Paternal Grandmother     possible gatric vs bladder cancer  . Bladder Cancer Paternal Grandmother   . Breast cancer Paternal Aunt     dxin her 67s; dad's maternal half sister  . Breast cancer Other     PGFs mother  . Anesthesia problems Neg Hx   . Hypotension Neg Hx   . Malignant hyperthermia Neg Hx   . Pseudochol deficiency Neg Hx   The patient's parents are both living, in their early 78s. The patient has one brother and one sister. On the father's side 1 great grandmother was diagnosed with breast cancer in her 22s. The patient's father's mother was diagnosed with stomach cancer. One of the patient's father's sisters was diagnosed with breast cancer in her 40s. On the maternal side patient's mother's father was diagnosed with throat cancer at age.   GYNECOLOGIC HISTORY:  No LMP recorded. Patient has had a hysterectomy.  menarche age 64, first live birth age 79. The patient is GX P1. She stopped having periods in 2008, when she underwent a simple hysterectomy without salpingo-oophorectomy. She has a history of endometriosis and was started on Provera in March of this year, with her next dose due next week. (However she has decided to forego further Provera treatments at least for now).   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 He is licensed in [place] but not in [place]. He is now in this area looking for a job. The patient's son [name] is a Ship broker in music at [employer]: Not in place   HEALTH MAINTENANCE: Social History  Substance Use Topics  . Smoking status: Never Smoker  . Smokeless tobacco: Never Used  . Alcohol use 0.0 oz/week     Comment: socially      Colonoscopy:  PAP:  Bone density:  Lipid panel:  Allergies  Allergen Reactions  . Amoxicillin Shortness Of Breath and Itching    Has patient had a PCN reaction causing immediate rash, facial/tongue/throat swelling, SOB or lightheadedness with hypotension: Yes Has patient had a PCN reaction causing severe rash involving mucus membranes or skin necrosis: No Has patient had a PCN reaction that required hospitalization Yes Has patient had a PCN reaction  occurring within the last 10 years: No If all of the above answers are "NO", then may proceed with Cephalosporin use.   Nestor Lewandowsky [Fluconazole] Nausea Only    heartburn    Current Outpatient Prescriptions  Medication Sig Dispense Refill  . hyaluronate sodium (RADIAPLEXRX) GEL Apply 1 application topically 2 (two) times daily.    . methocarbamol (ROBAXIN) 500 MG tablet Take 1 tablet (500 mg total) by mouth every 8 (eight) hours as needed for muscle spasms. 30 tablet 1  . non-metallic deodorant (ALRA) MISC Apply 1 application topically daily as needed.    . pantoprazole (PROTONIX) 40 MG tablet Take 1 tablet (40 mg total) by mouth daily. 30 tablet 5  . sertraline (ZOLOFT) 100 MG tablet Take 1 tablet (100 mg total) by mouth daily. 30 tablet 3  . traMADol (ULTRAM) 50 MG tablet Take 1 tablet (50 mg total) by mouth every 6 (six) hours as needed. 60 tablet 0  . zolpidem (AMBIEN) 10 MG tablet Take 1/2 to 1 tab po qhs prn  30 tablet 1  . diclofenac (VOLTAREN) 50 MG EC tablet Take 1 tablet (50 mg total) by mouth 2 (two) times daily. 60 tablet 0   No current facility-administered medications for this visit.     OBJECTIVE: Young-appearing African-American woman in no acute distress  Vitals:   08/11/16 1421  BP: 130/77  Pulse: 83  Resp: 18  Temp: 98.1 F (36.7 C)     Body mass index is 34.74 kg/m.    ECOG FS:1 - Symptomatic but completely ambulatory Filed Weights   08/11/16 1421  Weight: 202 lb 6.4 oz (91.8 kg)    GENERAL: Patient is a well appearing female in no acute distress HEENT:  Sclerae anicteric.  Oropharynx clear and moist. No ulcerations or evidence of oropharyngeal candidiasis. Neck is supple.  NODES:  No cervical, supraclavicular, or axillary lymphadenopathy palpated.  BREAST EXAM:  Deferred. LUNGS:  Clear to auscultation bilaterally.  No wheezes or rhonchi. HEART:  Regular rate and rhythm. No murmur appreciated. ABDOMEN:  Soft, nontender.  Positive, normoactive bowel sounds. No organomegaly palpated. MSK:  Focal C7 tenderness to palpation, and tenderness around T8-T9.  No tenderness to sternocleidomastoid muscle/trapezius muscle, rhomboid mucscle bilaterally.  Right SI joint tenderness.  No sciatic notch tenderness.  FROM without difficulty. EXTREMITIES:  No peripheral edema.   SKIN:  Clear with no obvious rashes or skin changes. No nail dyscrasia. NEURO:  Nonfocal. Well oriented.  Appropriate affect.  [name] Kitchen  LAB RESULTS:  CMP     Component Value Date/Time   NA 138 07/08/2016 0924   K 3.6 07/08/2016 0924   CL 107 12/04/2015 1858   CO2 24 07/08/2016 0924   GLUCOSE 104 07/08/2016 0924   BUN 18.0 07/08/2016 0924   CREATININE 0.8 07/08/2016 0924   CALCIUM 9.2 07/08/2016 0924   PROT 7.1 07/08/2016 0924   ALBUMIN 3.5 07/08/2016 0924   AST 19 07/08/2016 0924   ALT 18 07/08/2016 0924   ALKPHOS 81 07/08/2016 0924   BILITOT 0.27 07/08/2016 0924   GFRNONAA >60 12/04/2015 1858   GFRAA  >60 12/04/2015 1858    INo results found for: SPEP, UPEP  Lab Results  Component Value Date   WBC 6.2 07/08/2016   NEUTROABS 3.6 07/08/2016   HGB 10.7 (L) 07/08/2016   HCT 31.2 (L) 07/08/2016   MCV 93.2 07/08/2016   PLT 220 07/08/2016      Chemistry      Component Value Date/Time   NA  138 07/08/2016 0924   K 3.6 07/08/2016 0924   CL 107 12/04/2015 1858   CO2 24 07/08/2016 0924   BUN 18.0 07/08/2016 0924   CREATININE 0.8 07/08/2016 0924      Component Value Date/Time   CALCIUM 9.2 07/08/2016 0924   ALKPHOS 81 07/08/2016 0924   AST 19 07/08/2016 0924   ALT 18 07/08/2016 0924   BILITOT 0.27 07/08/2016 0924       No results found for: LABCA2  No components found for: LABCA125  No results for input(s): INR in the last 168 hours.  Urinalysis    Component Value Date/Time   COLORURINE STRAW (A) 12/11/2008 1923   APPEARANCEUR CLEAR 12/11/2008 1923   LABSPEC 1.020 12/11/2008 1923   PHURINE 6.0 12/11/2008 1923   GLUCOSEU NEGATIVE 12/11/2008 1923   HGBUR NEGATIVE 12/11/2008 1923   BILIRUBINUR NEGATIVE 12/11/2008 1923   KETONESUR NEGATIVE 12/11/2008 1923   PROTEINUR NEGATIVE 12/11/2008 1923   UROBILINOGEN 0.2 12/11/2008 1923   NITRITE NEGATIVE 12/11/2008 1923   LEUKOCYTESUR  12/11/2008 1923    NEGATIVE MICROSCOPIC NOT DONE ON URINES WITH NEGATIVE PROTEIN, BLOOD, LEUKOCYTES, NITRITE, OR GLUCOSE <1000 mg/dL.      ELIGIBLE FOR AVAILABLE RESEARCH PROTOCOL: no  STUDIES: No results found.  ASSESSMENT: 41 y.o. [place] woman status post left breast upper outer quadrant biopsy 11/20/2015 for a clinical T1c N0, stage IA invasive ductal carcinoma, grade 2, estrogen receptor moderately positive, progesterone receptor negative, HER-2 equivocal by both immunohistochemistry and FISH  (1) neoadjuvant tamoxifen started 12/11/2015, stopped at the start of chemotherapy  (2) genetics testing 01/03/2016 through the Breast/Ovarian gene panel offered by [facility] found no  deleterious mutations in ATM, BARD1, BRCA1, BRCA2, BRIP1, CDH1, CHEK2, EPCAM, FANCC, MLH1, MSH2, MSH6, NBN, PALB2, PMS2, PTEN, RAD51C, RAD51D, TP53, and XRCC2  (3) neoadjuvant chemotherapy consisting of carboplatin, docetaxel, trastuzumab and pertuzumab every 21 days 6 starting 01/01/2016, completed 04/15/2016  (4) trastuzumab to be continued to complete a year (through June 2018)  (a echocardiogram 07/24/2016) Shows an ejection fraction of 60-65%.   (5) left lumpectomy and sentinel lymph node sampling 05/12/2016 showed a complete pathologic response (ypT0, ypN0)  (a) Status post bilateral mastopexy 05/20/2016  (6) adjuvant radiation In process  (7) anti-estrogens to be continued 5-10 years, more likely the latter    PLAN:  I have refilled Zyara's tramadol today for her neck pain.  I am unsure why her neck is in pain.  I added Diclofenac ER BID to be taken with food.  Patient to continue Pantoprazole.  She will get xrays of her spine today and we will go from there.  Patient to call for any other concerns or questions.     Charlestine Massed, NP   08/11/2016 4:04 PM Medical Oncology and Hematology [facility] [address] [place], [place] [address] Tel. [phone]    Fax. [phone]

## 2016-08-11 NOTE — Telephone Encounter (Signed)
Received voice mail from patient confused about the scheduling of barium swallow.  Attempted to call patient, LMOM.  I sent a message to scheduling (in addition to LOS that was sent Friday) to schedule the barium swallow and f/u appt with me on 2/13.    Charlestine Massed, NP

## 2016-08-11 NOTE — Telephone Encounter (Signed)
Received phone call from [name] about developing new pain in her neck and shooting down her right leg over the weekend.  This came out of the blue and she denies any precipitating factor or thing that could have brought it on.  Patient would like to be seen today, sent scheduling message to schedulers.    Charlestine Massed, NP

## 2016-08-12 ENCOUNTER — Ambulatory Visit: Payer: BLUE CROSS/BLUE SHIELD | Attending: General Surgery | Admitting: Physical Therapy

## 2016-08-12 ENCOUNTER — Ambulatory Visit
Admission: RE | Admit: 2016-08-12 | Discharge: 2016-08-12 | Disposition: A | Discharge status: Home / Self Care | Payer: BLUE CROSS/BLUE SHIELD | Source: Ambulatory Visit | Attending: Radiation Oncology | Admitting: Radiation Oncology

## 2016-08-12 DIAGNOSIS — Z510 Encounter for antineoplastic radiation therapy: Secondary | ICD-10-CM | POA: Diagnosis not present

## 2016-08-12 DIAGNOSIS — R293 Abnormal posture: Secondary | ICD-10-CM | POA: Insufficient documentation

## 2016-08-12 DIAGNOSIS — M25512 Pain in left shoulder: Secondary | ICD-10-CM | POA: Insufficient documentation

## 2016-08-12 DIAGNOSIS — M25612 Stiffness of left shoulder, not elsewhere classified: Secondary | ICD-10-CM | POA: Insufficient documentation

## 2016-08-12 NOTE — Progress Notes (Signed)
Patient met with counseling intern for a counseling session 08/12/16 at 10am.   Pt shared with counselor about some recent physical pain she had been experiencing. Together, counselor and pt processed the ways her cancer journey was affecting the pt; physically, emotionally and spiritually. Counselor named the pt's desire to live with the duality of realism and hope and validated how difficult that can be to practice. Pt shared that she has started writing a book about her experience with cancer; counselor identified this as a way the pt is coping with her loss and finding meaning in her cancer journey.  Pt shared that this experience had brought up questions about her mortality. Pt and counselor talked about the reality of death and how the pt desires to live in light of her mortality. Pt shared that she was not afraid to die, but wanted to be as prepared for it as possible. Pt identified how she has had to let go of control throughout this cancer journey. Pt mentioned wanting to create a living will; counselor referred pt to social workers to help with this task. After session was complete, pt spoke with Johnnye Lana [id number] will.  Next Session scheduled for 08/18/16 at [facility], Counseling Intern Direct Supervisor, Lorrin Jackson, Chaplain

## 2016-08-12 NOTE — Therapy (Addendum)
[place], [place], [address] Phone: [phone]   Fax:  [phone]  Patient Details  Name: [name] MRN: [id number] Date of Birth: 01-31-1976 Referring Provider:  [name], MD  Encounter Date: 08/12/2016  [name] comes into today barely able to walk into clinic because of pain going down her whole spine from neck to low back and down lateral right leg.  It started last Friday after she was at a game with her son and has not dissipated.  She had xrays and is waiting for the report.  She is distressed and fearful about a recurrance. She is not able to tolerate any treatment today but time was spent talking about doing isomtric exercise for abdominals and glutes as tolerated and walking as she is able.  Note sent to Charlestine Massed to inform her of patient status today and request that she call her. Pt advised that I will leave this episode open and pt can come back for a renewal for generalized strengthening with a new order from the doctor clearing her for acivitiy.  Donato Heinz. Owens Shark, PT   Norwood Levo 08/12/2016, 4:02 PM  [place], [place], [address] Phone: [phone]   Fax:  [phone]   PHYSICAL THERAPY DISCHARGE SUMMARY  Visits from Start of Care: 6  Current functional level related to goals / functional outcomes: Unknown    Remaining deficits: unkonwn    Education / Equipment: Home exercise  Plan: Patient agrees to discharge.  Patient goals were not met. Patient is being discharged due to not returning since the last visit.  ?????    Donato Heinz. Owens Shark, PT

## 2016-08-13 ENCOUNTER — Ambulatory Visit
Admission: RE | Admit: 2016-08-13 | Discharge: 2016-08-13 | Disposition: A | Discharge status: Home / Self Care | Payer: BLUE CROSS/BLUE SHIELD | Source: Ambulatory Visit | Attending: Radiation Oncology | Admitting: Radiation Oncology

## 2016-08-13 DIAGNOSIS — Z510 Encounter for antineoplastic radiation therapy: Secondary | ICD-10-CM | POA: Diagnosis not present

## 2016-08-14 ENCOUNTER — Ambulatory Visit
Admission: RE | Admit: 2016-08-14 | Discharge: 2016-08-14 | Disposition: A | Discharge status: Home / Self Care | Payer: BLUE CROSS/BLUE SHIELD | Source: Ambulatory Visit | Attending: Radiation Oncology | Admitting: Radiation Oncology

## 2016-08-14 DIAGNOSIS — Z510 Encounter for antineoplastic radiation therapy: Secondary | ICD-10-CM | POA: Diagnosis not present

## 2016-08-15 ENCOUNTER — Ambulatory Visit
Admission: RE | Admit: 2016-08-15 | Discharge: 2016-08-15 | Disposition: A | Discharge status: Home / Self Care | Payer: BLUE CROSS/BLUE SHIELD | Source: Ambulatory Visit | Attending: Radiation Oncology | Admitting: Radiation Oncology

## 2016-08-15 ENCOUNTER — Encounter: Payer: Self-pay | Admitting: Radiation Oncology

## 2016-08-15 VITALS — BP 144/82 | HR 87 | Temp 97.9°F | Resp 20 | Wt 203.2 lb

## 2016-08-15 DIAGNOSIS — Z510 Encounter for antineoplastic radiation therapy: Secondary | ICD-10-CM | POA: Diagnosis not present

## 2016-08-15 DIAGNOSIS — C50412 Malignant neoplasm of upper-outer quadrant of left female breast: Secondary | ICD-10-CM

## 2016-08-15 DIAGNOSIS — Z170 Estrogen receptor positive status [ER+]: Secondary | ICD-10-CM

## 2016-08-15 NOTE — Progress Notes (Signed)
Department of Radiation Oncology  Phone:  [phone] Fax:        [phone]  Weekly Treatment Note    Name: [name] Date: 08/15/2016 MRN: [id number] DOB: 08-Jun-1976   Diagnosis:     ICD-9-CM ICD-10-CM   1. Malignant neoplasm of upper-outer quadrant of left breast in female, estrogen receptor positive ([place]) 174.4 C50.412    V86.0 Z17.0      Current dose: 27 Gy  Current fraction:15   MEDICATIONS: Current Outpatient Prescriptions  Medication Sig Dispense Refill  . diclofenac (VOLTAREN) 50 MG EC tablet Take 1 tablet (50 mg total) by mouth 2 (two) times daily. 60 tablet 0  . hyaluronate sodium (RADIAPLEXRX) GEL Apply 1 application topically 2 (two) times daily.    . methocarbamol (ROBAXIN) 500 MG tablet Take 1 tablet (500 mg total) by mouth every 8 (eight) hours as needed for muscle spasms. 30 tablet 1  . non-metallic deodorant (ALRA) MISC Apply 1 application topically daily as needed.    . pantoprazole (PROTONIX) 40 MG tablet Take 1 tablet (40 mg total) by mouth daily. 30 tablet 5  . sertraline (ZOLOFT) 100 MG tablet Take 1 tablet (100 mg total) by mouth daily. 30 tablet 3  . traMADol (ULTRAM) 50 MG tablet Take 1 tablet (50 mg total) by mouth every 6 (six) hours as needed. 60 tablet 0  . zolpidem (AMBIEN) 10 MG tablet Take 1/2 to 1 tab po qhs prn 30 tablet 1   No current facility-administered medications for this encounter.      ALLERGIES: Amoxicillin and Diflucan [fluconazole]   LABORATORY DATA:  Lab Results  Component Value Date   WBC 6.2 07/08/2016   HGB 10.7 (L) 07/08/2016   HCT 31.2 (L) 07/08/2016   MCV 93.2 07/08/2016   PLT 220 07/08/2016   Lab Results  Component Value Date   NA 138 07/08/2016   K 3.6 07/08/2016   CL 107 12/04/2015   CO2 24 07/08/2016   Lab Results  Component Value Date   ALT 18 07/08/2016   AST 19 07/08/2016   ALKPHOS 81 07/08/2016   BILITOT 0.27 07/08/2016     NARRATIVE: [name] was seen today for  weekly treatment management. The chart was checked and the patient's films were reviewed.  The patient states that she is doing fairly well this week. Some increased irritation within the nipple region.  Weekly rad tx  Left breast, 15/33 completed, mild pink color, skin intact, swelling and stinging/burning for a couple minutes at at time on breast especially;y near nipple, using  radiaplex 2-3x day, mild fatigue BP (!) 144/82 (BP Location: Right Arm, Patient Position: Sitting, Cuff Size: Large)   Pulse 87   Temp 97.9 F (36.6 C) (Oral)   Resp 20   Wt 203 lb 3.2 oz (92.2 kg)   BMI 34.88 kg/m   Wt Readings from Last 3 Encounters:  08/15/16 203 lb 3.2 oz (92.2 kg)  08/11/16 202 lb 6.4 oz (91.8 kg)  08/08/16 200 lb 12.8 oz (91.1 kg)    PHYSICAL EXAMINATION: weight is 203 lb 3.2 oz (92.2 kg). Her oral temperature is 97.9 F (36.6 C). Her blood pressure is 144/82 (abnormal) and her pulse is 87. Her respiration is 20.      Mild hyperpigmentation in the treatment area. Overall her skin looks quite good for where she is in her treatment.  ASSESSMENT: The patient is doing satisfactorily with treatment.  PLAN: We will continue with the patient's radiation treatment  as planned.

## 2016-08-15 NOTE — Progress Notes (Signed)
Weekly rad tx  Left breast, 15/33 completed, mild pink color, skin intact, swelling and stinging/burning for a couple minutes at at time on breast especially;y near nipple, using  radiaplex 2-3x day, mild fatigue BP (!) 144/82 (BP Location: Right Arm, Patient Position: Sitting, Cuff Size: Large)   Pulse 87   Temp 97.9 F (36.6 C) (Oral)   Resp 20   Wt 203 lb 3.2 oz (92.2 kg)   BMI 34.88 kg/m   Wt Readings from Last 3 Encounters:  08/15/16 203 lb 3.2 oz (92.2 kg)  08/11/16 202 lb 6.4 oz (91.8 kg)  08/08/16 200 lb 12.8 oz (91.1 kg)

## 2016-08-18 ENCOUNTER — Other Ambulatory Visit: Payer: Self-pay | Admitting: Emergency Medicine

## 2016-08-18 ENCOUNTER — Other Ambulatory Visit: Payer: Self-pay | Admitting: Adult Health

## 2016-08-18 ENCOUNTER — Encounter: Payer: Self-pay | Admitting: *Deleted

## 2016-08-18 ENCOUNTER — Ambulatory Visit
Admission: RE | Admit: 2016-08-18 | Discharge: 2016-08-18 | Disposition: A | Discharge status: Home / Self Care | Payer: BLUE CROSS/BLUE SHIELD | Source: Ambulatory Visit | Attending: Radiation Oncology | Admitting: Radiation Oncology

## 2016-08-18 ENCOUNTER — Ambulatory Visit (HOSPITAL_COMMUNITY)
Admission: RE | Admit: 2016-08-18 | Discharge: 2016-08-18 | Disposition: A | Discharge status: Home / Self Care | Payer: BLUE CROSS/BLUE SHIELD | Source: Ambulatory Visit | Attending: Adult Health | Admitting: Adult Health

## 2016-08-18 DIAGNOSIS — Z510 Encounter for antineoplastic radiation therapy: Secondary | ICD-10-CM | POA: Diagnosis not present

## 2016-08-18 DIAGNOSIS — R1310 Dysphagia, unspecified: Secondary | ICD-10-CM

## 2016-08-18 DIAGNOSIS — C50412 Malignant neoplasm of upper-outer quadrant of left female breast: Secondary | ICD-10-CM

## 2016-08-18 DIAGNOSIS — R1319 Other dysphagia: Secondary | ICD-10-CM

## 2016-08-18 DIAGNOSIS — R1013 Epigastric pain: Secondary | ICD-10-CM | POA: Diagnosis not present

## 2016-08-18 DIAGNOSIS — K229 Disease of esophagus, unspecified: Secondary | ICD-10-CM | POA: Insufficient documentation

## 2016-08-18 DIAGNOSIS — Z170 Estrogen receptor positive status [ER+]: Secondary | ICD-10-CM

## 2016-08-18 NOTE — Progress Notes (Signed)
[facility] Social Work  Clinical Social Work was referred by patient to review and complete healthcare advance directives.  Clinical Social Worker met with patient in [place] office.  The patient designated [name] B. Fray as their primary healthcare agent and Excell Seltzer as their secondary agent.  Patient also completed healthcare living will.    Clinical Social Worker notarized documents and made copies for patient/family. Clinical Social Worker will send documents to medical records to be scanned into patient's chart. Clinical Social Worker encouraged patient/family to contact with any additional questions or concerns.  Johnnye Lana, MSW, LCSW, OSW-C Clinical Social Worker [facility] [phone]

## 2016-08-19 ENCOUNTER — Ambulatory Visit (HOSPITAL_BASED_OUTPATIENT_CLINIC_OR_DEPARTMENT_OTHER): Payer: BLUE CROSS/BLUE SHIELD

## 2016-08-19 ENCOUNTER — Ambulatory Visit
Admission: RE | Admit: 2016-08-19 | Discharge: 2016-08-19 | Disposition: A | Discharge status: Home / Self Care | Payer: BLUE CROSS/BLUE SHIELD | Source: Ambulatory Visit | Attending: Radiation Oncology | Admitting: Radiation Oncology

## 2016-08-19 ENCOUNTER — Other Ambulatory Visit (HOSPITAL_BASED_OUTPATIENT_CLINIC_OR_DEPARTMENT_OTHER): Payer: BLUE CROSS/BLUE SHIELD

## 2016-08-19 ENCOUNTER — Ambulatory Visit (HOSPITAL_BASED_OUTPATIENT_CLINIC_OR_DEPARTMENT_OTHER): Payer: BLUE CROSS/BLUE SHIELD | Admitting: Adult Health

## 2016-08-19 ENCOUNTER — Telehealth: Payer: Self-pay | Admitting: Oncology

## 2016-08-19 ENCOUNTER — Encounter: Payer: Self-pay | Admitting: Adult Health

## 2016-08-19 VITALS — BP 128/70 | HR 76 | Temp 98.4°F | Resp 20

## 2016-08-19 DIAGNOSIS — C50412 Malignant neoplasm of upper-outer quadrant of left female breast: Secondary | ICD-10-CM

## 2016-08-19 DIAGNOSIS — Z5112 Encounter for antineoplastic immunotherapy: Secondary | ICD-10-CM

## 2016-08-19 DIAGNOSIS — K219 Gastro-esophageal reflux disease without esophagitis: Secondary | ICD-10-CM

## 2016-08-19 DIAGNOSIS — M546 Pain in thoracic spine: Secondary | ICD-10-CM

## 2016-08-19 DIAGNOSIS — Z452 Encounter for adjustment and management of vascular access device: Secondary | ICD-10-CM | POA: Diagnosis not present

## 2016-08-19 DIAGNOSIS — Z170 Estrogen receptor positive status [ER+]: Secondary | ICD-10-CM | POA: Diagnosis not present

## 2016-08-19 DIAGNOSIS — Z510 Encounter for antineoplastic radiation therapy: Secondary | ICD-10-CM | POA: Diagnosis not present

## 2016-08-19 LAB — COMPREHENSIVE METABOLIC PANEL
ALT: 14 U/L (ref 0–55)
AST: 16 U/L (ref 5–34)
Albumin: 3.7 g/dL (ref 3.5–5.0)
Alkaline Phosphatase: 85 U/L (ref 40–150)
Anion Gap: 8 mEq/L (ref 3–11)
BUN: 10.9 mg/dL (ref 7.0–26.0)
CO2: 24 mEq/L (ref 22–29)
Calcium: 9.3 mg/dL (ref 8.4–10.4)
Chloride: 104 mEq/L (ref 98–109)
Creatinine: 0.8 mg/dL (ref 0.6–1.1)
EGFR: >90 mL/min/{1.73_m2} (ref >90)
Glucose: 77 mg/dl (ref 70–140)
Potassium: 3.6 mEq/L (ref 3.5–5.1)
Sodium: 137 mEq/L (ref 136–145)
Total Bilirubin: 0.28 mg/dL (ref 0.20–1.20)
Total Protein: 7.4 g/dL (ref 6.4–8.3)

## 2016-08-19 LAB — CBC WITH DIFFERENTIAL/PLATELET
BASO%: 0.2 % (ref 0.0–2.0)
Basophils Absolute: 0 10*3/uL (ref 0.0–0.1)
EOS%: 5.1 % (ref 0.0–7.0)
Eosinophils Absolute: 0.3 10*3/uL (ref 0.0–0.5)
HCT: 31.6 % — ABNORMAL LOW (ref 34.8–46.6)
HGB: 10.8 g/dL — ABNORMAL LOW (ref 11.6–15.9)
LYMPH%: 24 % (ref 14.0–49.7)
MCH: 30 pg (ref 25.1–34.0)
MCHC: 34.2 g/dL (ref 31.5–36.0)
MCV: 87.8 fL (ref 79.5–101.0)
MONO#: 0.6 10*3/uL (ref 0.1–0.9)
MONO%: 8.7 % (ref 0.0–14.0)
NEUT#: 4.1 10*3/uL (ref 1.5–6.5)
NEUT%: 62 % (ref 38.4–76.8)
Platelets: 211 10*3/uL (ref 145–400)
RBC: 3.6 10*6/uL — ABNORMAL LOW (ref 3.70–5.45)
RDW: 12.9 % (ref 11.2–14.5)
WBC: 6.7 10*3/uL (ref 3.9–10.3)
lymph#: 1.6 10*3/uL (ref 0.9–3.3)

## 2016-08-19 MED ORDER — TRASTUZUMAB CHEMO 150 MG IV SOLR
6.0000 mg/kg | Freq: Once | INTRAVENOUS | Status: AC
  Administered 2016-08-19: 504 mg via INTRAVENOUS
  Filled 2016-08-19: qty 24

## 2016-08-19 MED ORDER — ACETAMINOPHEN 325 MG PO TABS
ORAL_TABLET | ORAL | Status: AC
  Filled 2016-08-19: qty 2

## 2016-08-19 MED ORDER — DIPHENHYDRAMINE HCL 25 MG PO CAPS
25.0000 mg | ORAL_CAPSULE | Freq: Once | ORAL | Status: AC
  Administered 2016-08-19: 25 mg via ORAL

## 2016-08-19 MED ORDER — SODIUM CHLORIDE 0.9% FLUSH
10.0000 mL | INTRAVENOUS | Status: DC | PRN
  Administered 2016-08-19: 10 mL
  Filled 2016-08-19: qty 10

## 2016-08-19 MED ORDER — SODIUM CHLORIDE 0.9 % IV SOLN
Freq: Once | INTRAVENOUS | Status: AC
  Administered 2016-08-19: 15:00:00 via INTRAVENOUS

## 2016-08-19 MED ORDER — HEPARIN SOD (PORK) LOCK FLUSH 100 UNIT/ML IV SOLN
500.0000 [IU] | Freq: Once | INTRAVENOUS | Status: AC | PRN
  Administered 2016-08-19: 500 [IU]
  Filled 2016-08-19: qty 5

## 2016-08-19 MED ORDER — ALTEPLASE 2 MG IJ SOLR
2.0000 mg | Freq: Once | INTRAMUSCULAR | Status: AC | PRN
  Administered 2016-08-19: 2 mg
  Filled 2016-08-19: qty 2

## 2016-08-19 MED ORDER — ACETAMINOPHEN 325 MG PO TABS
650.0000 mg | ORAL_TABLET | Freq: Once | ORAL | Status: AC
  Administered 2016-08-19: 650 mg via ORAL

## 2016-08-19 MED ORDER — DIPHENHYDRAMINE HCL 25 MG PO CAPS
ORAL_CAPSULE | ORAL | Status: AC
  Filled 2016-08-19: qty 1

## 2016-08-19 NOTE — Telephone Encounter (Signed)
No los per 08/19/16 visit.

## 2016-08-19 NOTE — Patient Instructions (Signed)
[facility] Discharge Instructions for Patients Receiving Chemotherapy  Today you received the following chemotherapy agents: Herceptin   To help prevent nausea and vomiting after your treatment, we encourage you to take your nausea medication as directed.    If you develop nausea and vomiting that is not controlled by your nausea medication, call the clinic.   BELOW ARE SYMPTOMS THAT SHOULD BE REPORTED IMMEDIATELY:  *FEVER GREATER THAN 100.5 F  *CHILLS WITH OR WITHOUT FEVER  NAUSEA AND VOMITING THAT IS NOT CONTROLLED WITH YOUR NAUSEA MEDICATION  *UNUSUAL SHORTNESS OF BREATH  *UNUSUAL BRUISING OR BLEEDING  TENDERNESS IN MOUTH AND THROAT WITH OR WITHOUT PRESENCE OF ULCERS  *URINARY PROBLEMS  *BOWEL PROBLEMS  UNUSUAL RASH Items with * indicate a potential emergency and should be followed up as soon as possible.  Feel free to call the clinic you have any questions or concerns. The clinic phone number is [phone].  Please show the CHEMO ALERT CARD at check-in to the Emergency Department and triage nurse.   

## 2016-08-19 NOTE — Progress Notes (Signed)
[name]  Telephone:([phone]) [phone] Fax:([phone]) [phone]     ID: [name] DOB: 1975-12-03  MR#: [id number]  RPR#:[id number]  Patient Care Team: No Pcp Per Patient as PCP - General (General Practice) [name], MD as Consulting Physician (Oncology) [name], MD as Consulting Physician (General Surgery) [name], MD as Consulting Physician (Obstetrics and Gynecology) PCP: No PCP Per Patient OTHER MD:  CHIEF COMPLAINT: HER-2 positive, Estrogen receptor moderately positive breast cancer  CURRENT TREATMENT: anti-HER-2 immunotherapy; adjuvant radiation   BREAST CANCER HISTORY: From the original intake note:  [name] woke up the morning of 11/09/2015 with some pain in her left axilla. She examined herself and found a lump in her left breast. She saw her gynecologist the same day, and she confirms a lump. The patient then proceeded directly to mammography. I do not have that report. However it did show the mass. [name] was then scheduled for ultrasound-guided biopsy 11/21/2015. This showed ([place] [phone] at the [facility]) and invasive ductal carcinoma, grade 2 estrogen receptor positive, with moderate intensity (10-50%), progesterone receptor negative, and HER-2 equivocal by immunohistochemistry. Fish was obtained and was equivocal as well, with a signals ratio of 1.8 to, the number per cell being 5.7.  On 12/05/2015 the patient had a CT/ angiogram of the chest for evaluation of her left-sided chest pain. This showed no evidence of a clot. The left breast nodule measured 1.3 cm on the study. There was no enlarged axillary adenopathy. There was also no evidence of lung metastasis or blastic or destructive lytic bone lesions. The upper abdomen was unremarkable.  With that information the patient presents for further evaluation and treatment.  INTERVAL HISTORY: [name] is here today for herceptin therapy.  She  continues to receive adjuvant radiation and is tolerating it well  She had a barium swallow yesterday and there is concern for reflux, and a peptic area seen. She has been recommended an upper GI before but hasn't had it done due to insurance.  She continues to have back pain and wants physical therapy to help with this.  She occasionally has diarrhea.  She continues to cough, and have some mild shortness of breath.  She denies chest pain, palpitations, swelling, PND, or any other concerns.  Last echo was in early January.    REVIEW OF SYSTEMS: A complete 14 point ros was reviewed and is negative other than what is mentioned above.  PAST MEDICAL HISTORY: Past Medical History:  Diagnosis Date  . Allergy   . Anxiety    has panic attacks  . Asthma    childhood, none now  . Breast cancer ([place]) 11/20/15   left breast  . Depression   . Endometriosis   . Family history of breast cancer   . GERD (gastroesophageal reflux disease)   . Herniated disc, cervical   . History of bronchitis as a child   . History of cancer chemotherapy    left breast  . History of stomach ulcers   . History of urinary tract infection   . Hormone disorder   . IBD (inflammatory bowel disease)   . Migraine without aura   . Neck pain   . PONV (postoperative nausea and vomiting)   . Shortness of breath dyspnea    anxiety related    PAST SURGICAL HISTORY: Past Surgical History:  Procedure Laterality Date  . ABDOMINAL HYSTERECTOMY    . laparoscopically assisted vaginal hysterectomy  01/2006  Still has ovaries  . LAPAROSCOPY     x 2, diagnosed with endometriosis (prior to hysterectomy), only treated medically.  Marland Kitchen MASTOPEXY Bilateral 05/20/2016   Procedure: MASTOPEXY;  Surgeon: Irene Limbo, MD;  Location: [facility];  Service: Plastics;  Laterality: Bilateral;  . PORTACATH PLACEMENT Right 12/25/2015   Procedure: INSERTION PORT-A-CATH WITH ULTRA SOUND;  Surgeon: [name], MD;  Location:  [facility];  Service: General;  Laterality: Right;  . RADIOACTIVE SEED GUIDED MASTECTOMY WITH AXILLARY SENTINEL LYMPH NODE BIOPSY Left 05/12/2016   Procedure: BREAST LUMPECTOMY WITH RADIOACTIVE SEED AND SENTINEL LYMPH NODE BIOPSY AND BLUE DYE INJECTION;  Surgeon: [name], MD;  Location: [facility];  Service: General;  Laterality: Left;    FAMILY HISTORY Family History  Problem Relation Age of Onset  . Sarcoidosis Mother   . Hypertension Father   . Throat cancer Maternal Grandfather     smoker and heavy drinker; dx in his late 68s-50s  . Cancer Paternal Grandmother     possible gatric vs bladder cancer  . Bladder Cancer Paternal Grandmother   . Breast cancer Paternal Aunt     dxin her 44s; dad's maternal half sister  . Breast cancer Other     PGFs mother  . Anesthesia problems Neg Hx   . Hypotension Neg Hx   . Malignant hyperthermia Neg Hx   . Pseudochol deficiency Neg Hx   The patient's parents are both living, in their early 62s. The patient has one brother and one sister. On the father's side 1 great grandmother was diagnosed with breast cancer in her 37s. The patient's father's mother was diagnosed with stomach cancer. One of the patient's father's sisters was diagnosed with breast cancer in her 41s. On the maternal side patient's mother's father was diagnosed with throat cancer at age.   GYNECOLOGIC HISTORY:  No LMP recorded. Patient has had a hysterectomy.  menarche age 51, first live birth age 18. The patient is GX P1. She stopped having periods in 2008, when she underwent a simple hysterectomy without salpingo-oophorectomy. She has a history of endometriosis and was started on Provera in March of this year, with her next dose due next week. (However she has decided to forego further Provera treatments at least for now).   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 He is licensed in [place] but not in [place]. He is now in this area looking for a job. The patient's son [name] is a Ship broker in music at [place]: Not in place   HEALTH MAINTENANCE: Social History  Substance Use Topics  . Smoking status: Never Smoker  . Smokeless tobacco: Never Used  . Alcohol use 0.0 oz/week     Comment: socially      Colonoscopy:  PAP:  Bone density:  Lipid panel:  Allergies  Allergen Reactions  . Amoxicillin Shortness Of Breath and Itching    Has patient had a PCN reaction causing immediate rash, facial/tongue/throat swelling, SOB or lightheadedness with hypotension: Yes Has patient had a PCN reaction causing severe rash involving mucus membranes or skin necrosis: No Has patient had a PCN reaction that required hospitalization Yes Has patient had a PCN reaction occurring within the last 10 years: No If all of the above answers are "NO", then may proceed with  Cephalosporin use.   . Diflucan [Fluconazole] Nausea Only    heartburn    Current Outpatient Prescriptions  Medication Sig Dispense Refill  . diclofenac (VOLTAREN) 50 MG EC tablet Take 1 tablet (50 mg total) by mouth 2 (two) times daily. 60 tablet 0  . hyaluronate sodium (RADIAPLEXRX) GEL Apply 1 application topically 2 (two) times daily.    . methocarbamol (ROBAXIN) 500 MG tablet Take 1 tablet (500 mg total) by mouth every 8 (eight) hours as needed for muscle spasms. 30 tablet 1  . non-metallic deodorant (ALRA) MISC Apply 1 application topically daily as needed.    . pantoprazole (PROTONIX) 40 MG tablet Take 1 tablet (40 mg total) by mouth daily. 30 tablet 5  . sertraline (ZOLOFT) 100 MG tablet Take 1 tablet (100 mg total) by mouth daily. 30 tablet 3  . traMADol (ULTRAM) 50 MG tablet Take 1 tablet (50 mg total) by mouth every 6 (six) hours as needed. 60 tablet 0  . zolpidem (AMBIEN) 10 MG tablet Take 1/2 to 1  tab po qhs prn 30 tablet 1   No current facility-administered medications for this visit.     OBJECTIVE: Young-appearing African-American woman in no acute distress  There were no vitals filed for this visit.   There is no height or weight on file to calculate BMI.    ECOG FS:1 - Symptomatic but completely ambulatory There were no vitals filed for this visit. VS done in treatment room, please see epic GENERAL: Patient is a well appearing female in no acute distress HEENT:  Sclerae anicteric.  Oropharynx clear and moist. No ulcerations or evidence of oropharyngeal candidiasis. Neck is supple.  NODES:  No cervical, supraclavicular, or axillary lymphadenopathy palpated.  BREAST EXAM:  Deferred. LUNGS:  Clear to auscultation bilaterally.  No wheezes or rhonchi. HEART:  Regular rate and rhythm. No murmur appreciated. No JVD ABDOMEN:  Soft, nontender.  Positive, normoactive bowel sounds. No organomegaly palpated. MSK:  No focal spinal tenderness to palpation. Full range of motion bilaterally in the upper extremities. EXTREMITIES:  No peripheral edema.   SKIN:  Clear with no obvious rashes or skin changes. No nail dyscrasia. NEURO:  Nonfocal. Well oriented.  Appropriate affect.   Marland Kitchen  LAB RESULTS:  CMP     Component Value Date/Time   NA 137 08/19/2016 1221   K 3.6 08/19/2016 1221   CL 107 12/04/2015 1858   CO2 24 08/19/2016 1221   GLUCOSE 77 08/19/2016 1221   BUN 10.9 08/19/2016 1221   CREATININE 0.8 08/19/2016 1221   CALCIUM 9.3 08/19/2016 1221   PROT 7.4 08/19/2016 1221   ALBUMIN 3.7 08/19/2016 1221   AST 16 08/19/2016 1221   ALT 14 08/19/2016 1221   ALKPHOS 85 08/19/2016 1221   BILITOT 0.28 08/19/2016 1221   GFRNONAA >60 12/04/2015 1858   GFRAA >60 12/04/2015 1858    INo results found for: SPEP, UPEP  Lab Results  Component Value Date   WBC 6.7 08/19/2016   NEUTROABS 4.1 08/19/2016   HGB 10.8 (L) 08/19/2016   HCT 31.6 (L) 08/19/2016   MCV 87.8 08/19/2016   PLT 211  08/19/2016      Chemistry      Component Value Date/Time   NA 137 08/19/2016 1221   K 3.6 08/19/2016 1221   CL 107 12/04/2015 1858   CO2 24 08/19/2016 1221   BUN 10.9 08/19/2016 1221   CREATININE 0.8 08/19/2016 1221      Component Value Date/Time   CALCIUM  9.3 08/19/2016 1221   ALKPHOS 85 08/19/2016 1221   AST 16 08/19/2016 1221   ALT 14 08/19/2016 1221   BILITOT 0.28 08/19/2016 1221       No results found for: LABCA2  No components found for: LABCA125  No results for input(s): INR in the last 168 hours.  Urinalysis    Component Value Date/Time   COLORURINE STRAW (A) 12/11/2008 1923   APPEARANCEUR CLEAR 12/11/2008 1923   LABSPEC 1.020 12/11/2008 1923   PHURINE 6.0 12/11/2008 1923   GLUCOSEU NEGATIVE 12/11/2008 1923   HGBUR NEGATIVE 12/11/2008 1923   BILIRUBINUR NEGATIVE 12/11/2008 1923   KETONESUR NEGATIVE 12/11/2008 1923   PROTEINUR NEGATIVE 12/11/2008 1923   UROBILINOGEN 0.2 12/11/2008 1923   NITRITE NEGATIVE 12/11/2008 1923   LEUKOCYTESUR  12/11/2008 1923    NEGATIVE MICROSCOPIC NOT DONE ON URINES WITH NEGATIVE PROTEIN, BLOOD, LEUKOCYTES, NITRITE, OR GLUCOSE <1000 mg/dL.      ELIGIBLE FOR AVAILABLE RESEARCH PROTOCOL: no  STUDIES: Dg Cervical Spine 2-3 Views  Result Date: 08/11/2016 CLINICAL DATA:  Posterior neck and thoracic pain radiating down the right arm for 4 days. EXAM: CERVICAL SPINE - 2-3 VIEW COMPARISON:  04/29/2014 FINDINGS: There is unchanged cervical spine straightening. No listhesis. Prevertebral soft tissues are within normal limits. No cervical spine fracture is identified. Intervertebral disc space heights are preserved without significant degenerative change identified. Right jugular Port-A-Cath is partially visualized. IMPRESSION: No evidence of acute osseous abnormality or significant degenerative change in the cervical spine. Electronically Signed   By: Logan Bores M.D.   On: 08/11/2016 17:19   Dg Thoracic Spine 2 View  Result Date:  08/11/2016 CLINICAL DATA:  Acute thoracic spine pain without known injury. EXAM: THORACIC SPINE 2 VIEWS COMPARISON:  None. FINDINGS: There is no evidence of thoracic spine fracture. Alignment is normal. No other significant bone abnormalities are identified. IMPRESSION: Normal thoracic spine. Electronically Signed   By: Marijo Conception, M.D.   On: 08/11/2016 17:24   Dg Lumbar Spine 2-3 Views  Result Date: 08/11/2016 CLINICAL DATA:  Acute lower back and right leg pain for 4 days without known injury. EXAM: LUMBAR SPINE - 2-3 VIEW COMPARISON:  None. FINDINGS: There is no evidence of lumbar spine fracture. Alignment is normal. Intervertebral disc spaces are maintained. Right renal calculus is noted. IMPRESSION: No abnormality seen in the lumbar spine. Right renal calculus is noted. Electronically Signed   By: Marijo Conception, M.D.   On: 08/11/2016 17:25   Dg Esophagus  Result Date: 08/18/2016 CLINICAL DATA:  Dysphagia. Burning sensation with dry cough and occasional food regurgitation. EXAM: ESOPHOGRAM/BARIUM SWALLOW TECHNIQUE: Combined double contrast and single contrast examination performed using effervescent crystals, thick barium liquid, and thin barium liquid. FLUOROSCOPY TIME:  Fluoroscopy Time:  1 minutes 7 seconds Radiation Exposure Index (if provided by the fluoroscopic device): 7.5 mGy Number of Acquired Spot Images: 0 COMPARISON:  None. FINDINGS: Lateral pharyngeal imaging was normal. No diverticulum, obstruction, or aspiration. Fold thickening along the lower esophagus with mildly irregular borders but no stricture, hiatal hernia or generalized reticulation. This appearance normalized with maximal distention and no fixed mass was noted. Motility was normal. Gastroesophageal reflux was seen. IMPRESSION: Mild irregular fold thickening in the lower esophagus that is likely peptic. No hiatal hernia or stricture. Electronically Signed   By: Monte Fantasia M.D.   On: 08/18/2016 14:14    ASSESSMENT: 41  y.o. [place] woman status post left breast upper outer quadrant biopsy 11/20/2015 for a clinical T1c N0,  stage IA invasive ductal carcinoma, grade 2, estrogen receptor moderately positive, progesterone receptor negative, HER-2 equivocal by both immunohistochemistry and FISH  (1) neoadjuvant tamoxifen started 12/11/2015, stopped at the start of chemotherapy  (2) genetics testing 01/03/2016 through the Breast/Ovarian gene panel offered by [facility] found no deleterious mutations in ATM, BARD1, BRCA1, BRCA2, BRIP1, CDH1, CHEK2, EPCAM, FANCC, MLH1, MSH2, MSH6, NBN, PALB2, PMS2, PTEN, RAD51C, RAD51D, TP53, and XRCC2  (3) neoadjuvant chemotherapy consisting of carboplatin, docetaxel, trastuzumab and pertuzumab every 21 days 6 starting 01/01/2016, completed 04/15/2016  (4) trastuzumab to be continued to complete a year (through June 2018)  (a echocardiogram 07/24/2016) Shows an ejection fraction of 60-65%.   (5) left lumpectomy and sentinel lymph node sampling 05/12/2016 showed a complete pathologic response (ypT0, ypN0)  (a) Status post bilateral mastopexy 05/20/2016  (6) adjuvant radiation In process  (7) anti-estrogens to be continued 5-10 years, more likely the latter    PLAN:  [place] is doing well today, her labs are stable and I reviewed those with her today.  She will proceed with herceptin.  We discussed her barium swallow results.  She is taking protonix, however I think she would benefit from eval by a gastroenterologist.  She is in agreement.  I will set up PT for the back pain.  She will have chest xray later this week, however I informed her that I really think her cough and lung issues are due in part by her reflux.  We will get the ball rolling on these orders and I will touch base with her after her chest xray.    A total of (30) minutes of face-to-face time was spent with this patient with greater than 50% of that time in counseling and care-coordination.    Charlestine Massed, NP    08/19/2016 1:27 PM Medical Oncology and Hematology [facility] [address] [place], [place] [address] Tel. [phone]    Fax. [phone]

## 2016-08-20 ENCOUNTER — Ambulatory Visit
Admission: RE | Admit: 2016-08-20 | Discharge: 2016-08-20 | Disposition: A | Discharge status: Home / Self Care | Payer: BLUE CROSS/BLUE SHIELD | Source: Ambulatory Visit | Attending: Radiation Oncology | Admitting: Radiation Oncology

## 2016-08-20 ENCOUNTER — Encounter: Payer: Self-pay | Admitting: Radiation Oncology

## 2016-08-20 DIAGNOSIS — Z510 Encounter for antineoplastic radiation therapy: Secondary | ICD-10-CM | POA: Diagnosis not present

## 2016-08-21 ENCOUNTER — Ambulatory Visit
Admission: RE | Admit: 2016-08-21 | Discharge: 2016-08-21 | Disposition: A | Discharge status: Home / Self Care | Payer: BLUE CROSS/BLUE SHIELD | Source: Ambulatory Visit | Attending: Radiation Oncology | Admitting: Radiation Oncology

## 2016-08-21 DIAGNOSIS — Z510 Encounter for antineoplastic radiation therapy: Secondary | ICD-10-CM | POA: Diagnosis not present

## 2016-08-22 ENCOUNTER — Ambulatory Visit
Admission: RE | Admit: 2016-08-22 | Discharge: 2016-08-22 | Disposition: A | Discharge status: Home / Self Care | Payer: BLUE CROSS/BLUE SHIELD | Source: Ambulatory Visit | Attending: Radiation Oncology | Admitting: Radiation Oncology

## 2016-08-22 ENCOUNTER — Encounter: Payer: Self-pay | Admitting: Radiation Oncology

## 2016-08-22 ENCOUNTER — Ambulatory Visit (HOSPITAL_COMMUNITY)
Admission: RE | Admit: 2016-08-22 | Discharge: 2016-08-22 | Disposition: A | Discharge status: Home / Self Care | Payer: BLUE CROSS/BLUE SHIELD | Source: Ambulatory Visit | Attending: Adult Health | Admitting: Adult Health

## 2016-08-22 VITALS — BP 129/75 | HR 82 | Temp 98.5°F | Resp 20 | Wt 205.6 lb

## 2016-08-22 DIAGNOSIS — C50412 Malignant neoplasm of upper-outer quadrant of left female breast: Secondary | ICD-10-CM

## 2016-08-22 DIAGNOSIS — Z170 Estrogen receptor positive status [ER+]: Secondary | ICD-10-CM | POA: Insufficient documentation

## 2016-08-22 DIAGNOSIS — Z510 Encounter for antineoplastic radiation therapy: Secondary | ICD-10-CM | POA: Diagnosis not present

## 2016-08-22 DIAGNOSIS — R918 Other nonspecific abnormal finding of lung field: Secondary | ICD-10-CM | POA: Insufficient documentation

## 2016-08-22 MED ORDER — RADIAPLEXRX EX GEL: Freq: Once | CUTANEOUS | Status: DC

## 2016-08-22 NOTE — Progress Notes (Signed)
Weekly radtx left breast  20/33 completed, mild erythema and tanning, skin intact, using radiaplex 3x day, gave another tube, v/o tightness,pulling under left arm and stinging around nipple area,takes tramadol prn 10:30 AM BP 129/75 (BP Location: Right Arm, Patient Position: Sitting, Cuff Size: Large)   Pulse 82   Temp 98.5 F (36.9 C) (Oral)   Resp 20   Wt 205 lb 9.6 oz (93.3 kg)   BMI 35.29 kg/m   Wt Readings from Last 3 Encounters:  08/22/16 205 lb 9.6 oz (93.3 kg)  08/15/16 203 lb 3.2 oz (92.2 kg)  08/11/16 202 lb 6.4 oz (91.8 kg)

## 2016-08-23 NOTE — Progress Notes (Signed)
Department of Radiation Oncology  Phone:  [phone] Fax:        [phone]  Weekly Treatment Note    Name: [name] Date: 08/23/2016 MRN: [id number] DOB: [birth year]   Diagnosis:     ICD-9-CM ICD-10-CM   1. Malignant neoplasm of upper-outer quadrant of left female breast, unspecified estrogen receptor status ([place]) 174.4 C50.412 DISCONTINUED: hyaluronate sodium (RADIAPLEXRX) gel     Current dose: 36 Gy  Current fraction: 20   MEDICATIONS: Current Outpatient Prescriptions  Medication Sig Dispense Refill  . diclofenac (VOLTAREN) 50 MG EC tablet Take 1 tablet (50 mg total) by mouth 2 (two) times daily. 60 tablet 0  . hyaluronate sodium (RADIAPLEXRX) GEL Apply 1 application topically 2 (two) times daily.    . methocarbamol (ROBAXIN) 500 MG tablet Take 1 tablet (500 mg total) by mouth every 8 (eight) hours as needed for muscle spasms. 30 tablet 1  . non-metallic deodorant (ALRA) MISC Apply 1 application topically daily as needed.    . pantoprazole (PROTONIX) 40 MG tablet Take 1 tablet (40 mg total) by mouth daily. 30 tablet 5  . sertraline (ZOLOFT) 100 MG tablet Take 1 tablet (100 mg total) by mouth daily. 30 tablet 3  . traMADol (ULTRAM) 50 MG tablet Take 1 tablet (50 mg total) by mouth every 6 (six) hours as needed. 60 tablet 0  . zolpidem (AMBIEN) 10 MG tablet Take 1/2 to 1 tab po qhs prn 30 tablet 1   No current facility-administered medications for this encounter.      ALLERGIES: Amoxicillin and Diflucan [fluconazole]   LABORATORY DATA:  Lab Results  Component Value Date   WBC 6.7 08/19/2016   HGB 10.8 (L) 08/19/2016   HCT 31.6 (L) 08/19/2016   MCV 87.8 08/19/2016   PLT 211 08/19/2016   Lab Results  Component Value Date   NA 137 08/19/2016   K 3.6 08/19/2016   CL 107 12/04/2015   CO2 24 08/19/2016   Lab Results  Component Value Date   ALT 14 08/19/2016   AST 16 08/19/2016   ALKPHOS 85 08/19/2016   BILITOT 0.28 08/19/2016     NARRATIVE:  [name] was seen today for weekly treatment management. The chart was checked and the patient's films were reviewed.  The patient has noted some ongoing/increased irritation of the skin with some tightness, especially irritation in the nipple area lower region. No major other changes.  Weekly radtx left breast  20/33 completed, mild erythema and tanning, skin intact, using radiaplex 3x day, gave another tube, v/o tightness,pulling under left arm and stinging around nipple area,takes tramadol prn 11:19 AM BP 129/75 (BP Location: Right Arm, Patient Position: Sitting, Cuff Size: Large)   Pulse 82   Temp 98.5 F (36.9 C) (Oral)   Resp 20   Wt 205 lb 9.6 oz (93.3 kg)   BMI 35.29 kg/m   Wt Readings from Last 3 Encounters:  08/22/16 205 lb 9.6 oz (93.3 kg)  08/15/16 203 lb 3.2 oz (92.2 kg)  08/11/16 202 lb 6.4 oz (91.8 kg)    PHYSICAL EXAMINATION: weight is 205 lb 9.6 oz (93.3 kg). Her oral temperature is 98.5 F (36.9 C). Her blood pressure is 129/75 and her pulse is 82. Her respiration is 20.      Moderate hyperpigmentation and treatment area. Overall her skin looks quite good for where she is in her treatment.  ASSESSMENT: The patient is doing satisfactorily with treatment.  PLAN: We will continue with the  patient's radiation treatment as planned.

## 2016-08-25 ENCOUNTER — Ambulatory Visit
Admission: RE | Admit: 2016-08-25 | Discharge: 2016-08-25 | Disposition: A | Discharge status: Home / Self Care | Payer: BLUE CROSS/BLUE SHIELD | Source: Ambulatory Visit | Attending: Radiation Oncology | Admitting: Radiation Oncology

## 2016-08-25 DIAGNOSIS — Z510 Encounter for antineoplastic radiation therapy: Secondary | ICD-10-CM | POA: Diagnosis not present

## 2016-08-26 ENCOUNTER — Telehealth: Payer: Self-pay | Admitting: *Deleted

## 2016-08-26 ENCOUNTER — Ambulatory Visit
Admission: RE | Admit: 2016-08-26 | Discharge: 2016-08-26 | Disposition: A | Discharge status: Home / Self Care | Payer: BLUE CROSS/BLUE SHIELD | Source: Ambulatory Visit | Attending: Radiation Oncology | Admitting: Radiation Oncology

## 2016-08-26 DIAGNOSIS — Z510 Encounter for antineoplastic radiation therapy: Secondary | ICD-10-CM | POA: Diagnosis not present

## 2016-08-26 DIAGNOSIS — R05 Cough: Secondary | ICD-10-CM

## 2016-08-26 DIAGNOSIS — R059 Cough, unspecified: Secondary | ICD-10-CM

## 2016-08-26 MED ORDER — AZITHROMYCIN 250 MG PO TABS: ORAL_TABLET | ORAL | 0 refills | Status: DC

## 2016-08-26 NOTE — Telephone Encounter (Signed)
Telephone call to patient to discuss Xray results. Patient still experiencing dry cough and slight SOB with exertion. Pt advised Z-pak sent to her pharmacy. She will return to [facility] to have another Xray completed in 3 weeks. Pt verbalized an understanding of the directions for antibiotic and repeat xray. Pt will contact this office with any concerns or questions.

## 2016-08-26 NOTE — Progress Notes (Signed)
Patient met with counseling intern for counseling session today at 2pm.  Patient shared recent personal steps towards growth and development. Patient and counselor discussed the work of establishing healthy patterns and how this work often takes many small steps. Patient recounted reconnecting with her husband emotionally and shared how their communication has improved recently. Pt shared how the greatest gift her husband could give her is his presence and his ear. Pt attended [facility]'s "[employer] the [employer]" event and said that she found it very helpful. Pt shared that she and her husband are having to get reacquainted because they have both changed throughout the cancer journey.  Pt and counselor discussed pt's commitment to better self-care and how pt's feels she has left her over-achieving, type-A self behind. Pt named several self-care outlets that she is enjoying including writing and walking. Pt considers attending counseling as another form of self-care and said she can feel the difference when she misses a session.  Next Session scheduled for 2/27 at [facility], Counseling Intern Lorrin Jackson, [place], Contractor

## 2016-08-27 ENCOUNTER — Ambulatory Visit
Admission: RE | Admit: 2016-08-27 | Discharge: 2016-08-27 | Disposition: A | Discharge status: Home / Self Care | Payer: BLUE CROSS/BLUE SHIELD | Source: Ambulatory Visit | Attending: Radiation Oncology | Admitting: Radiation Oncology

## 2016-08-27 DIAGNOSIS — Z510 Encounter for antineoplastic radiation therapy: Secondary | ICD-10-CM | POA: Diagnosis not present

## 2016-08-28 ENCOUNTER — Ambulatory Visit
Admission: RE | Admit: 2016-08-28 | Discharge: 2016-08-28 | Disposition: A | Discharge status: Home / Self Care | Payer: BLUE CROSS/BLUE SHIELD | Source: Ambulatory Visit | Attending: Radiation Oncology | Admitting: Radiation Oncology

## 2016-08-28 DIAGNOSIS — Z510 Encounter for antineoplastic radiation therapy: Secondary | ICD-10-CM | POA: Diagnosis not present

## 2016-08-29 ENCOUNTER — Ambulatory Visit: Payer: BLUE CROSS/BLUE SHIELD

## 2016-08-29 ENCOUNTER — Ambulatory Visit: Payer: BLUE CROSS/BLUE SHIELD | Admitting: Radiation Oncology

## 2016-08-29 ENCOUNTER — Ambulatory Visit
Admission: RE | Admit: 2016-08-29 | Payer: BLUE CROSS/BLUE SHIELD | Source: Ambulatory Visit | Admitting: Radiation Oncology

## 2016-09-01 ENCOUNTER — Ambulatory Visit: Payer: BLUE CROSS/BLUE SHIELD | Admitting: Radiation Oncology

## 2016-09-01 ENCOUNTER — Ambulatory Visit
Admission: RE | Admit: 2016-09-01 | Discharge: 2016-09-01 | Disposition: A | Discharge status: Home / Self Care | Payer: BLUE CROSS/BLUE SHIELD | Source: Ambulatory Visit | Attending: Radiation Oncology | Admitting: Radiation Oncology

## 2016-09-01 DIAGNOSIS — C50412 Malignant neoplasm of upper-outer quadrant of left female breast: Secondary | ICD-10-CM

## 2016-09-01 DIAGNOSIS — Z170 Estrogen receptor positive status [ER+]: Secondary | ICD-10-CM

## 2016-09-01 DIAGNOSIS — Z510 Encounter for antineoplastic radiation therapy: Secondary | ICD-10-CM | POA: Diagnosis not present

## 2016-09-01 NOTE — Progress Notes (Signed)
Patient seen in the back by MD, patient couldn't stary for nursing assessment, vitals 3:53 PM

## 2016-09-01 NOTE — Progress Notes (Signed)
   Department of Radiation Oncology  Phone:  [phone] Fax:        [phone]  Weekly Treatment Note    Name: [name] Date: 09/01/2016 MRN: [id number] DOB: 1975/10/09   Diagnosis:   No diagnosis found.   Current dose: 45Gy   Current fraction: 25   MEDICATIONS: Current Outpatient Prescriptions  Medication Sig Dispense Refill  . azithromycin (ZITHROMAX) 250 MG tablet Take 2 pills today then one a day for 4 additional days 6 tablet 0  . diclofenac (VOLTAREN) 50 MG EC tablet Take 1 tablet (50 mg total) by mouth 2 (two) times daily. 60 tablet 0  . hyaluronate sodium (RADIAPLEXRX) GEL Apply 1 application topically 2 (two) times daily.    . methocarbamol (ROBAXIN) 500 MG tablet Take 1 tablet (500 mg total) by mouth every 8 (eight) hours as needed for muscle spasms. 30 tablet 1  . non-metallic deodorant (ALRA) MISC Apply 1 application topically daily as needed.    . pantoprazole (PROTONIX) 40 MG tablet Take 1 tablet (40 mg total) by mouth daily. 30 tablet 5  . sertraline (ZOLOFT) 100 MG tablet Take 1 tablet (100 mg total) by mouth daily. 30 tablet 3  . traMADol (ULTRAM) 50 MG tablet Take 1 tablet (50 mg total) by mouth every 6 (six) hours as needed. 60 tablet 0  . zolpidem (AMBIEN) 10 MG tablet Take 1/2 to 1 tab po qhs prn 30 tablet 1   No current facility-administered medications for this encounter.      ALLERGIES: Amoxicillin and Diflucan [fluconazole]   LABORATORY DATA:  Lab Results  Component Value Date   WBC 6.7 08/19/2016   HGB 10.8 (L) 08/19/2016   HCT 31.6 (L) 08/19/2016   MCV 87.8 08/19/2016   PLT 211 08/19/2016   Lab Results  Component Value Date   NA 137 08/19/2016   K 3.6 08/19/2016   CL 107 12/04/2015   CO2 24 08/19/2016   Lab Results  Component Value Date   ALT 14 08/19/2016   AST 16 08/19/2016   ALKPHOS 85 08/19/2016   BILITOT 0.28 08/19/2016     NARRATIVE: [name] was seen today for weekly treatment management. The  chart was checked and the patient's films were reviewed.  The patient states that she is doing well. Some continued tightness in the treatment region .  11:48 AM There were no vitals taken for this visit.  Wt Readings from Last 3 Encounters:  08/22/16 205 lb 9.6 oz (93.3 kg)  08/15/16 203 lb 3.2 oz (92.2 kg)  08/11/16 202 lb 6.4 oz (91.8 kg)    PHYSICAL EXAMINATION:  Alert, no acute distress. In good spirits.   ASSESSMENT: The patient is doing satisfactorily with treatment.  PLAN: We will continue with the patient's radiation treatment as planned.   ------------------------------------------------  Jodelle Gross, MD, PhD  This document serves as a record of services personally performed by Kyung Rudd, MD. It was created on his behalf by Darcus Austin, a trained medical scribe. The creation of this record is based on the scribe's personal observations and the provider's statements to them. This document has been checked and approved by the attending provider.

## 2016-09-02 ENCOUNTER — Ambulatory Visit
Admission: RE | Admit: 2016-09-02 | Discharge: 2016-09-02 | Disposition: A | Discharge status: Home / Self Care | Payer: BLUE CROSS/BLUE SHIELD | Source: Ambulatory Visit | Attending: Radiation Oncology | Admitting: Radiation Oncology

## 2016-09-02 DIAGNOSIS — Z510 Encounter for antineoplastic radiation therapy: Secondary | ICD-10-CM | POA: Diagnosis not present

## 2016-09-03 ENCOUNTER — Ambulatory Visit
Admission: RE | Admit: 2016-09-03 | Discharge: 2016-09-03 | Disposition: A | Discharge status: Home / Self Care | Payer: BLUE CROSS/BLUE SHIELD | Source: Ambulatory Visit | Attending: Radiation Oncology | Admitting: Radiation Oncology

## 2016-09-03 ENCOUNTER — Ambulatory Visit: Payer: BLUE CROSS/BLUE SHIELD | Admitting: Radiation Oncology

## 2016-09-03 DIAGNOSIS — Z510 Encounter for antineoplastic radiation therapy: Secondary | ICD-10-CM | POA: Diagnosis not present

## 2016-09-03 NOTE — Progress Notes (Signed)
Checked under left breast, peeling areas, and erythema, gave samples of neosporin to use in that area , radiaplex rest of left bresat,patient thanked RN and will see Friday after treatment 3:38 PM

## 2016-09-04 ENCOUNTER — Ambulatory Visit
Admission: RE | Admit: 2016-09-04 | Discharge: 2016-09-04 | Disposition: A | Discharge status: Home / Self Care | Payer: BLUE CROSS/BLUE SHIELD | Source: Ambulatory Visit | Attending: Radiation Oncology | Admitting: Radiation Oncology

## 2016-09-04 ENCOUNTER — Encounter: Payer: Self-pay | Admitting: Physician Assistant

## 2016-09-04 ENCOUNTER — Ambulatory Visit: Payer: BLUE CROSS/BLUE SHIELD

## 2016-09-04 DIAGNOSIS — Z510 Encounter for antineoplastic radiation therapy: Secondary | ICD-10-CM | POA: Diagnosis not present

## 2016-09-05 ENCOUNTER — Ambulatory Visit
Admission: RE | Admit: 2016-09-05 | Discharge: 2016-09-05 | Disposition: A | Discharge status: Home / Self Care | Payer: BLUE CROSS/BLUE SHIELD | Source: Ambulatory Visit | Attending: Radiation Oncology | Admitting: Radiation Oncology

## 2016-09-05 ENCOUNTER — Ambulatory Visit: Payer: BLUE CROSS/BLUE SHIELD

## 2016-09-05 ENCOUNTER — Encounter: Payer: Self-pay | Admitting: Radiation Oncology

## 2016-09-05 VITALS — BP 121/88 | HR 88 | Temp 97.6°F | Resp 18 | Wt 202.6 lb

## 2016-09-05 DIAGNOSIS — C50412 Malignant neoplasm of upper-outer quadrant of left female breast: Secondary | ICD-10-CM

## 2016-09-05 DIAGNOSIS — Z170 Estrogen receptor positive status [ER+]: Secondary | ICD-10-CM

## 2016-09-05 DIAGNOSIS — Z510 Encounter for antineoplastic radiation therapy: Secondary | ICD-10-CM | POA: Diagnosis not present

## 2016-09-05 NOTE — Progress Notes (Signed)
Weekly radtx left breast 29/33 completd, erythema,hyperpigmentation on breast, slight swelling, under inframmary fold peeling, dry, using neosporin with pain relief in that area radiaplex elsewhere on the breast, pain where skin peeled BP 121/88 (BP Location: Right Arm, Patient Position: Sitting, Cuff Size: Large)   Pulse 88   Temp 97.6 F (36.4 C) (Oral)   Resp 18   Wt 202 lb 9.6 oz (91.9 kg)   BMI 34.78 kg/m   Wt Readings from Last 3 Encounters:  09/05/16 202 lb 9.6 oz (91.9 kg)  09/03/16 203 lb 6.4 oz (92.3 kg)  08/22/16 205 lb 9.6 oz (93.3 kg)

## 2016-09-07 NOTE — Progress Notes (Signed)
Complex simulation note  Diagnosis: Left-sided breast cancer  Narrative The patient has initially been planned to receive a course of whole breast radiation to a dose of 50.4 Gy in 28 fractions. The patient will now receive an additional boost to the seroma cavity which has been contoured. This will correspond to a boost of 10 Gy at 2 Gy per fraction. To accomplish this, an additional 3 customized blocks have been designed for this purpose. A complex isodose plan is requested to ensure that the target area is adequately covered with radiation dose and that the nearby normal structures such as the lung are adequately spared. The patient's final total dose will be 60.4 Gy.  ------------------------------------------------  John S. Moody, MD, PhD   

## 2016-09-07 NOTE — Progress Notes (Signed)
Department of Radiation Oncology  Phone:  [phone] Fax:        [phone]  Weekly Treatment Note    Name: [name] Date: 09/07/2016 MRN: [id number] DOB: Jul 19, 1975   Diagnosis:     ICD-9-CM ICD-10-CM   1. Malignant neoplasm of upper-outer quadrant of left breast in female, estrogen receptor positive ([place]) 174.4 C50.412    V86.0 Z17.0      Current dose: 52.4 Gy  Current fraction: 29   MEDICATIONS: Current Outpatient Prescriptions  Medication Sig Dispense Refill  . azithromycin (ZITHROMAX) 250 MG tablet Take 2 pills today then one a day for 4 additional days 6 tablet 0  . diclofenac (VOLTAREN) 50 MG EC tablet Take 1 tablet (50 mg total) by mouth 2 (two) times daily. 60 tablet 0  . hyaluronate sodium (RADIAPLEXRX) GEL Apply 1 application topically 2 (two) times daily.    . methocarbamol (ROBAXIN) 500 MG tablet Take 1 tablet (500 mg total) by mouth every 8 (eight) hours as needed for muscle spasms. 30 tablet 1  . non-metallic deodorant (ALRA) MISC Apply 1 application topically daily as needed.    . pantoprazole (PROTONIX) 40 MG tablet Take 1 tablet (40 mg total) by mouth daily. 30 tablet 5  . sertraline (ZOLOFT) 100 MG tablet Take 1 tablet (100 mg total) by mouth daily. 30 tablet 3  . traMADol (ULTRAM) 50 MG tablet Take 1 tablet (50 mg total) by mouth every 6 (six) hours as needed. 60 tablet 0  . zolpidem (AMBIEN) 10 MG tablet Take 1/2 to 1 tab po qhs prn 30 tablet 1   No current facility-administered medications for this encounter.      ALLERGIES: Amoxicillin and Diflucan [fluconazole]   LABORATORY DATA:  Lab Results  Component Value Date   WBC 6.7 08/19/2016   HGB 10.8 (L) 08/19/2016   HCT 31.6 (L) 08/19/2016   MCV 87.8 08/19/2016   PLT 211 08/19/2016   Lab Results  Component Value Date   NA 137 08/19/2016   K 3.6 08/19/2016   CL 107 12/04/2015   CO2 24 08/19/2016   Lab Results  Component Value Date   ALT 14 08/19/2016   AST 16 08/19/2016     ALKPHOS 85 08/19/2016   BILITOT 0.28 08/19/2016     NARRATIVE: [name] was seen today for weekly treatment management. The chart was checked and the patient's films were reviewed.  The patient states she is continuing to do fairly well. Some continued irritation in the treatment area and she continues to use skin cream daily. Also using Neosporin.  Weekly radtx left breast 29/33 completd, erythema,hyperpigmentation on breast, slight swelling, under inframmary fold peeling, dry, using neosporin with pain relief in that area radiaplex elsewhere on the breast, pain where skin peeled BP 121/88 (BP Location: Right Arm, Patient Position: Sitting, Cuff Size: Large)   Pulse 88   Temp 97.6 F (36.4 C) (Oral)   Resp 18   Wt 202 lb 9.6 oz (91.9 kg)   BMI 34.78 kg/m   Wt Readings from Last 3 Encounters:  09/05/16 202 lb 9.6 oz (91.9 kg)  09/03/16 203 lb 6.4 oz (92.3 kg)  08/22/16 205 lb 9.6 oz (93.3 kg)    PHYSICAL EXAMINATION: weight is 202 lb 9.6 oz (91.9 kg). Her oral temperature is 97.6 F (36.4 C). Her blood pressure is 121/88 and her pulse is 88. Her respiration is 18.      Hyperpigmentation with some dry desquamation present.  ASSESSMENT:  The patient is doing satisfactorily with treatment.  PLAN: We will continue with the patient's radiation treatment as planned.

## 2016-09-08 ENCOUNTER — Ambulatory Visit
Admission: RE | Admit: 2016-09-08 | Discharge: 2016-09-08 | Disposition: A | Discharge status: Home / Self Care | Payer: BLUE CROSS/BLUE SHIELD | Source: Ambulatory Visit | Attending: Radiation Oncology | Admitting: Radiation Oncology

## 2016-09-08 ENCOUNTER — Ambulatory Visit: Payer: BLUE CROSS/BLUE SHIELD

## 2016-09-08 ENCOUNTER — Ambulatory Visit: Payer: BLUE CROSS/BLUE SHIELD | Admitting: Radiation Oncology

## 2016-09-08 DIAGNOSIS — Z510 Encounter for antineoplastic radiation therapy: Secondary | ICD-10-CM | POA: Diagnosis not present

## 2016-09-09 ENCOUNTER — Other Ambulatory Visit (HOSPITAL_BASED_OUTPATIENT_CLINIC_OR_DEPARTMENT_OTHER): Payer: BLUE CROSS/BLUE SHIELD

## 2016-09-09 ENCOUNTER — Ambulatory Visit (HOSPITAL_BASED_OUTPATIENT_CLINIC_OR_DEPARTMENT_OTHER): Payer: BLUE CROSS/BLUE SHIELD

## 2016-09-09 ENCOUNTER — Ambulatory Visit: Payer: BLUE CROSS/BLUE SHIELD

## 2016-09-09 ENCOUNTER — Ambulatory Visit
Admission: RE | Admit: 2016-09-09 | Discharge: 2016-09-09 | Disposition: A | Discharge status: Home / Self Care | Payer: BLUE CROSS/BLUE SHIELD | Source: Ambulatory Visit | Attending: Radiation Oncology | Admitting: Radiation Oncology

## 2016-09-09 ENCOUNTER — Ambulatory Visit: Payer: BLUE CROSS/BLUE SHIELD | Admitting: Oncology

## 2016-09-09 VITALS — BP 124/76 | HR 81 | Temp 98.2°F | Resp 18

## 2016-09-09 DIAGNOSIS — Z170 Estrogen receptor positive status [ER+]: Secondary | ICD-10-CM

## 2016-09-09 DIAGNOSIS — Z5112 Encounter for antineoplastic immunotherapy: Secondary | ICD-10-CM

## 2016-09-09 DIAGNOSIS — C50412 Malignant neoplasm of upper-outer quadrant of left female breast: Secondary | ICD-10-CM | POA: Diagnosis not present

## 2016-09-09 DIAGNOSIS — Z510 Encounter for antineoplastic radiation therapy: Secondary | ICD-10-CM | POA: Diagnosis not present

## 2016-09-09 LAB — CBC WITH DIFFERENTIAL/PLATELET
BASO%: 0.1 % (ref 0.0–2.0)
Basophils Absolute: 0 10*3/uL (ref 0.0–0.1)
EOS%: 3.4 % (ref 0.0–7.0)
Eosinophils Absolute: 0.2 10*3/uL (ref 0.0–0.5)
HCT: 31.6 % — ABNORMAL LOW (ref 34.8–46.6)
HGB: 10.9 g/dL — ABNORMAL LOW (ref 11.6–15.9)
LYMPH%: 18 % (ref 14.0–49.7)
MCH: 29.9 pg (ref 25.1–34.0)
MCHC: 34.5 g/dL (ref 31.5–36.0)
MCV: 86.8 fL (ref 79.5–101.0)
MONO#: 0.6 10*3/uL (ref 0.1–0.9)
MONO%: 9.6 % (ref 0.0–14.0)
NEUT#: 4.6 10*3/uL (ref 1.5–6.5)
NEUT%: 68.9 % (ref 38.4–76.8)
Platelets: 215 10*3/uL (ref 145–400)
RBC: 3.64 10*6/uL — ABNORMAL LOW (ref 3.70–5.45)
RDW: 13.2 % (ref 11.2–14.5)
WBC: 6.7 10*3/uL (ref 3.9–10.3)
lymph#: 1.2 10*3/uL (ref 0.9–3.3)
nRBC: 0 % (ref 0–0)

## 2016-09-09 LAB — COMPREHENSIVE METABOLIC PANEL
ALT: 15 U/L (ref 0–55)
AST: 16 U/L (ref 5–34)
Albumin: 3.7 g/dL (ref 3.5–5.0)
Alkaline Phosphatase: 97 U/L (ref 40–150)
Anion Gap: 8 mEq/L (ref 3–11)
BUN: 11.3 mg/dL (ref 7.0–26.0)
CO2: 24 mEq/L (ref 22–29)
Calcium: 9.1 mg/dL (ref 8.4–10.4)
Chloride: 107 mEq/L (ref 98–109)
Creatinine: 0.9 mg/dL (ref 0.6–1.1)
EGFR: >90 mL/min/{1.73_m2} (ref >90)
Glucose: 99 mg/dl (ref 70–140)
Potassium: 3.6 mEq/L (ref 3.5–5.1)
Sodium: 139 mEq/L (ref 136–145)
Total Bilirubin: <0.2 mg/dL (ref 0.2–1.2)
Total Protein: 7.4 g/dL (ref 6.4–8.3)

## 2016-09-09 MED ORDER — DIPHENHYDRAMINE HCL 25 MG PO CAPS
ORAL_CAPSULE | ORAL | Status: AC
  Filled 2016-09-09: qty 1

## 2016-09-09 MED ORDER — SODIUM CHLORIDE 0.9 % IV SOLN
Freq: Once | INTRAVENOUS | Status: AC
  Administered 2016-09-09: 16:00:00 via INTRAVENOUS

## 2016-09-09 MED ORDER — SODIUM CHLORIDE 0.9% FLUSH
10.0000 mL | INTRAVENOUS | Status: DC | PRN
  Administered 2016-09-09: 10 mL
  Filled 2016-09-09: qty 10

## 2016-09-09 MED ORDER — ACETAMINOPHEN 325 MG PO TABS
ORAL_TABLET | ORAL | Status: AC
  Filled 2016-09-09: qty 2

## 2016-09-09 MED ORDER — HEPARIN SOD (PORK) LOCK FLUSH 100 UNIT/ML IV SOLN
500.0000 [IU] | Freq: Once | INTRAVENOUS | Status: AC | PRN
  Administered 2016-09-09: 500 [IU]
  Filled 2016-09-09: qty 5

## 2016-09-09 MED ORDER — DIPHENHYDRAMINE HCL 25 MG PO CAPS
25.0000 mg | ORAL_CAPSULE | Freq: Once | ORAL | Status: AC
  Administered 2016-09-09: 25 mg via ORAL

## 2016-09-09 MED ORDER — SODIUM CHLORIDE 0.9 % IV SOLN
6.0000 mg/kg | Freq: Once | INTRAVENOUS | Status: AC
  Administered 2016-09-09: 504 mg via INTRAVENOUS
  Filled 2016-09-09: qty 24

## 2016-09-09 MED ORDER — ACETAMINOPHEN 325 MG PO TABS
650.0000 mg | ORAL_TABLET | Freq: Once | ORAL | Status: AC
  Administered 2016-09-09: 650 mg via ORAL

## 2016-09-10 ENCOUNTER — Ambulatory Visit
Admission: RE | Admit: 2016-09-10 | Discharge: 2016-09-10 | Disposition: A | Discharge status: Home / Self Care | Payer: BLUE CROSS/BLUE SHIELD | Source: Ambulatory Visit | Attending: Radiation Oncology | Admitting: Radiation Oncology

## 2016-09-10 ENCOUNTER — Ambulatory Visit: Payer: BLUE CROSS/BLUE SHIELD

## 2016-09-10 ENCOUNTER — Encounter: Payer: Self-pay | Admitting: Radiation Oncology

## 2016-09-10 VITALS — BP 146/90 | HR 84 | Temp 97.9°F | Resp 16 | Wt 205.8 lb

## 2016-09-10 DIAGNOSIS — Z170 Estrogen receptor positive status [ER+]: Secondary | ICD-10-CM

## 2016-09-10 DIAGNOSIS — C50412 Malignant neoplasm of upper-outer quadrant of left female breast: Secondary | ICD-10-CM

## 2016-09-10 DIAGNOSIS — Z510 Encounter for antineoplastic radiation therapy: Secondary | ICD-10-CM | POA: Diagnosis not present

## 2016-09-10 NOTE — Progress Notes (Addendum)
Weekly rad tx left breast 32/33 completed, dry desquamation under axilla and under inframmary fold, painful, using neosporin in those areas, hyperpigmentation on breast, using radiaplex  Bid, mild fatigue   Wt Readings from Last 3 Encounters:  09/05/16 202 lb 9.6 oz (91.9 kg)  09/03/16 203 lb 6.4 oz (92.3 kg)  08/22/16 205 lb 9.6 oz (93.3 kg)   3:41 PM BP (!) 146/90 (BP Location: Right Arm, Patient Position: Sitting, Cuff Size: Large)   Pulse 84   Temp 97.9 F (36.6 C) (Oral)   Resp 16   Wt 205 lb 12.8 oz (93.4 kg)   BMI 35.33 kg/m

## 2016-09-10 NOTE — Progress Notes (Signed)
Department of Radiation Oncology  Phone:  [phone] Fax:        [phone]  Weekly Treatment Note    Name: [name] Date: 09/10/2016 MRN: [id number] DOB: 09-21-1975   Diagnosis:     ICD-9-CM ICD-10-CM   1. Malignant neoplasm of upper-outer quadrant of left breast in female, estrogen receptor positive ([place]) 174.4 C50.412    V86.0 Z17.0      Current dose: 58.4 Gy  Current fraction: 33   MEDICATIONS: Current Outpatient Prescriptions  Medication Sig Dispense Refill  . azithromycin (ZITHROMAX) 250 MG tablet Take 2 pills today then one a day for 4 additional days 6 tablet 0  . diclofenac (VOLTAREN) 50 MG EC tablet Take 1 tablet (50 mg total) by mouth 2 (two) times daily. 60 tablet 0  . hyaluronate sodium (RADIAPLEXRX) GEL Apply 1 application topically 2 (two) times daily.    . methocarbamol (ROBAXIN) 500 MG tablet Take 1 tablet (500 mg total) by mouth every 8 (eight) hours as needed for muscle spasms. 30 tablet 1  . non-metallic deodorant (ALRA) MISC Apply 1 application topically daily as needed.    . pantoprazole (PROTONIX) 40 MG tablet Take 1 tablet (40 mg total) by mouth daily. 30 tablet 5  . sertraline (ZOLOFT) 100 MG tablet Take 1 tablet (100 mg total) by mouth daily. 30 tablet 3  . traMADol (ULTRAM) 50 MG tablet Take 1 tablet (50 mg total) by mouth every 6 (six) hours as needed. 60 tablet 0  . zolpidem (AMBIEN) 10 MG tablet Take 1/2 to 1 tab po qhs prn 30 tablet 1   No current facility-administered medications for this encounter.      ALLERGIES: Amoxicillin and Diflucan [fluconazole]   LABORATORY DATA:  Lab Results  Component Value Date   WBC 6.7 09/09/2016   HGB 10.9 (L) 09/09/2016   HCT 31.6 (L) 09/09/2016   MCV 86.8 09/09/2016   PLT 215 09/09/2016   Lab Results  Component Value Date   NA 139 09/09/2016   K 3.6 09/09/2016   CL 107 12/04/2015   CO2 24 09/09/2016   Lab Results  Component Value Date   ALT 15 09/09/2016   AST 16 09/09/2016     ALKPHOS 97 09/09/2016   BILITOT <0.2 09/09/2016     NARRATIVE: [name] was seen today for weekly treatment management. The chart was checked and the patient's films were reviewed.  The patient is doing well. Continued irritation of the skin, especially in the inframammary region.  Weekly rad tx left breast 32/33 completed, dry desquamation under axilla and under inframmary fold, painful, using neosporin in those areas, hyperpigmentation on breast, using radiaplex  Bid, mild fatigue   Wt Readings from Last 3 Encounters:  09/10/16 205 lb 12.8 oz (93.4 kg)  09/05/16 202 lb 9.6 oz (91.9 kg)  09/03/16 203 lb 6.4 oz (92.3 kg)   9:38 PM BP (!) 146/90 (BP Location: Right Arm, Patient Position: Sitting, Cuff Size: Large)   Pulse 84   Temp 97.9 F (36.6 C) (Oral)   Resp 16   Wt 205 lb 12.8 oz (93.4 kg)   BMI 35.33 kg/m   PHYSICAL EXAMINATION: weight is 205 lb 12.8 oz (93.4 kg). Her oral temperature is 97.9 F (36.6 C). Her blood pressure is 146/90 (abnormal) and her pulse is 84. Her respiration is 16.      Dry desquamation in the inframammary region. No moist desquamation.  ASSESSMENT: The patient is doing satisfactorily with treatment.  PLAN: We will continue with the patient's radiation treatment as planned.   The patient finishes her final fraction of radiation treatments tomorrow. We will have her return to clinic in 1 month for routine follow-up.

## 2016-09-11 ENCOUNTER — Ambulatory Visit: Payer: BLUE CROSS/BLUE SHIELD | Admitting: Radiation Oncology

## 2016-09-11 ENCOUNTER — Ambulatory Visit
Admission: RE | Admit: 2016-09-11 | Discharge: 2016-09-11 | Disposition: A | Discharge status: Home / Self Care | Payer: BLUE CROSS/BLUE SHIELD | Source: Ambulatory Visit | Attending: Radiation Oncology | Admitting: Radiation Oncology

## 2016-09-11 DIAGNOSIS — Z510 Encounter for antineoplastic radiation therapy: Secondary | ICD-10-CM | POA: Diagnosis not present

## 2016-09-15 ENCOUNTER — Encounter: Payer: Self-pay | Admitting: Radiation Oncology

## 2016-09-15 NOTE — Progress Notes (Signed)
  Radiation Oncology         [phone] ________________________________  Name: [name] MRN: [id number]  Date: 09/15/2016  DOB: 1975-11-21  End of Treatment Note  Diagnosis:  Stage IA, T1c, N0, ER Positive, PR negative, HER2 equivocal  invasive ductal carcinoma of the left breast, post operative.     Indication for treatment: Curative       Radiation treatment dates: 07/28/16-09/11/16  Site/dose:   1) Left breast/ 50.4 Gy in 28 fractions   2) Left breast boost/ 10 Gy in 5 fractions  Beams/energy:   1) 3D /6X    2) Isodose plan/ 6X  Narrative: The patient tolerated radiation treatment relatively well. During treatment, the patient complained of pain under the inframammary fold. This area exhibited dry desquamation and irritation. Hyperpigmentation was noted in the treatment area.  Plan: The patient has completed radiation treatment. The patient will return to radiation oncology clinic for routine followup in one month. I advised them to call or return sooner if they have any questions or concerns related to their recovery or treatment.  ------------------------------------------------  Jodelle Gross, MD, PhD  This document serves as a record of services personally performed by Kyung Rudd, MD. It was created on his behalf by Bethann Humble, a trained medical scribe. The creation of this record is based on the scribe's personal observations and the provider's statements to them. This document has been checked and approved by the attending provider.

## 2016-09-16 ENCOUNTER — Encounter: Payer: Self-pay | Admitting: Physician Assistant

## 2016-09-16 ENCOUNTER — Ambulatory Visit (INDEPENDENT_AMBULATORY_CARE_PROVIDER_SITE_OTHER): Payer: BLUE CROSS/BLUE SHIELD | Admitting: Physician Assistant

## 2016-09-16 VITALS — BP 110/78 | HR 96 | Ht 64.0 in | Wt 208.2 lb

## 2016-09-16 DIAGNOSIS — K6289 Other specified diseases of anus and rectum: Secondary | ICD-10-CM

## 2016-09-16 DIAGNOSIS — K219 Gastro-esophageal reflux disease without esophagitis: Secondary | ICD-10-CM

## 2016-09-16 DIAGNOSIS — C50912 Malignant neoplasm of unspecified site of left female breast: Secondary | ICD-10-CM | POA: Diagnosis not present

## 2016-09-16 DIAGNOSIS — R194 Change in bowel habit: Secondary | ICD-10-CM

## 2016-09-16 DIAGNOSIS — Z8371 Family history of colonic polyps: Secondary | ICD-10-CM | POA: Diagnosis not present

## 2016-09-16 DIAGNOSIS — K589 Irritable bowel syndrome without diarrhea: Secondary | ICD-10-CM

## 2016-09-16 MED ORDER — NA SULFATE-K SULFATE-MG SULF 17.5-3.13-1.6 GM/177ML PO SOLN: 1.0000 | Freq: Once | ORAL | 0 refills | Status: AC

## 2016-09-16 MED ORDER — OMEPRAZOLE 40 MG PO CPDR: 40.0000 mg | DELAYED_RELEASE_CAPSULE | Freq: Every day | ORAL | 11 refills | Status: DC

## 2016-09-16 NOTE — Patient Instructions (Addendum)
If you are age 43 or older, your body mass index should be between 23-30. Your Body mass index is 35.75 kg/m. If this is out of the aforementioned range listed, please consider follow up with your Primary Care Provider.  If you are age 67 or younger, your body mass index should be between 19-25. Your Body mass index is 35.75 kg/m. If this is out of the aformentioned range listed, please consider follow up with your Primary Care Provider.   You have been scheduled for an endoscopy and colonoscopy. Please follow the written instructions given to you at your visit today. Please pick up your prep supplies at the pharmacy within the next 1-3 days. If you use inhalers (even only as needed), please bring them with you on the day of your procedure. Your physician has requested that you go to www.startemmi.com and enter the access code given to you at your visit today. This web site gives a general overview about your procedure. However, you should still follow specific instructions given to you by our office regarding your preparation for the procedure.  We have sent the following medications to your pharmacy for you to pick up at your convenience:  Suprep Omeprazole  We are giving you information on anti-reflux regimen and GERD Diet.  Follow up in three weeks.  Please call the office in one week to set up appointment.  Thank you for choosing me and [facility].

## 2016-09-16 NOTE — Progress Notes (Signed)
Subjective:    Patient ID: [name], female    DOB: Mar 04, 1976, 41 y.o.   MRN: [id number]  HPI [name] is a pleasant 41 year old African-American female, new to GI today referred by Dr. Jana Hakim for evaluation of persistent heartburn and reflux. Patient has history of breast cancer diagnosed in 2017, she has completed chemotherapy and just finished 6 weeks of radiation. She states that she has had some chronic problems with GERD, but had never required regular medication. She says she would periodically take over-the-counter Zantac which was effective. Over the past 4-5 months she has had daily symptoms with heartburn and indigestion and has had frequent nocturnal symptoms waking with heartburn, cough and feeling as if there is fluid in her throat. She has had some vague intermittent dysphagia as well. Appetite has been fine, weight has been stable and currently denies any regular abdominal pain. She has altered her diet some but continues with persistent daily symptoms. She is currently using Zantac on an as-needed basis. Patient also relates that she would like to have a colonoscopy. She has no family history of colon cancer but mother has had colon polyps. She says she has been told she has IBS in the past and has ongoing problems with alternating bowel habits and intermittent episodes of rectal pain. She has external hemorrhoids that bother her intermittently. She says she has been very concerned about increased cancer risk since being  diagnosed with breast cancer.  Review of Systems Pertinent positive and negative review of systems were noted in the above HPI section.  All other review of systems was otherwise negative.  Outpatient Encounter Prescriptions as of 09/16/2016  Medication Sig  . hyaluronate sodium (RADIAPLEXRX) GEL Apply 1 application topically 2 (two) times daily.  . non-metallic deodorant Jethro Poling) MISC Apply 1 application topically daily as needed.  . ranitidine (ZANTAC) 150  MG capsule Take 150 mg by mouth as needed for heartburn.  . sertraline (ZOLOFT) 100 MG tablet Take 1 tablet (100 mg total) by mouth daily.  . traMADol (ULTRAM) 50 MG tablet Take 1 tablet (50 mg total) by mouth every 6 (six) hours as needed.  . zolpidem (AMBIEN) 10 MG tablet Take 1/2 to 1 tab po qhs prn  . Na Sulfate-K Sulfate-Mg Sulf 17.5-3.13-1.6 GM/180ML SOLN Take 1 kit by mouth once.  Marland Kitchen omeprazole (PRILOSEC) 40 MG capsule Take 1 capsule (40 mg total) by mouth daily. [place]  . [DISCONTINUED] azithromycin (ZITHROMAX) 250 MG tablet Take 2 pills today then one a day for 4 additional days  . [DISCONTINUED] diclofenac (VOLTAREN) 50 MG EC tablet Take 1 tablet (50 mg total) by mouth 2 (two) times daily.  . [DISCONTINUED] methocarbamol (ROBAXIN) 500 MG tablet Take 1 tablet (500 mg total) by mouth every 8 (eight) hours as needed for muscle spasms.  . [DISCONTINUED] pantoprazole (PROTONIX) 40 MG tablet Take 1 tablet (40 mg total) by mouth daily.   No facility-administered encounter medications on file as of 09/16/2016.    Allergies  Allergen Reactions  . Amoxicillin Shortness Of Breath and Itching    Has patient had a PCN reaction causing immediate rash, facial/tongue/throat swelling, SOB or lightheadedness with hypotension: Yes Has patient had a PCN reaction causing severe rash involving mucus membranes or skin necrosis: No Has patient had a PCN reaction that required hospitalization Yes Has patient had a PCN reaction occurring within the last 10 years: No If all of the above answers are "NO", then may proceed with Cephalosporin use.   Marland Kitchen  Diflucan [Fluconazole] Nausea Only    heartburn   Patient Active Problem List   Diagnosis Date Noted  . Diarrhea 04/02/2016  . Dehydration 04/02/2016  . Genetic testing 01/07/2016  . Family history of breast cancer   . Breast cancer of upper-outer quadrant of left female breast ([place]) 12/11/2015   Social History   Social History  .  Marital status: Married    Spouse name: N/A  . Number of children: 1  . Years of education: N/A   Occupational History  . Not on file.   Social History Main Topics  . Smoking status: Never Smoker  . Smokeless tobacco: Never Used  . Alcohol use 0.0 oz/week     Comment: socially   . Drug use: No  . Sexual activity: Yes    Partners: Male    Birth control/ protection: Surgical   Other Topics Concern  . Not on file   Social History Narrative  . No narrative on file    Ms. Campbell's family history includes Bladder Cancer in her paternal grandmother; Breast cancer in her other and paternal aunt; Cancer in her paternal grandmother; Hypertension in her father; Sarcoidosis in her mother; Throat cancer in her maternal grandfather.      Objective:    Vitals:   09/16/16 1029  BP: 110/78  Pulse: 96    Physical Exam   well-developed African-American female in no acute distress, pleasant blood pressure 110/78 pulse 96, height 5 foot 4, weight 208, BMI 35.7. HEENT ;nontraumatic  Norm cephalic EOMI PERRLA sclera anicteric, Cardiovascular;regular rate and rhythm with S1-S2 , Pulmonary ;clear bilaterally, abdomen ;obese soft nontender nondistended bowel sounds are active there is no palpable mass or hepatosplenomegaly, Rectal ;exam not done, Extremities; no clubbing cyanosis or edema skin warm and dry, Neuropsych ;mood and affect appropriate       Assessment & Plan:   #10 41 year old African-American female with history of GERD with worsening persistent daily symptoms over the past 4-5 months and frequent nocturnal awakening with heartburn and cough and sour brash. #2 personal history of breast cancer diagnosed 2017 has completed chemotherapy and radiation #3 alternating bowel habits #4 intermittent rectal pain-etiology not clear #5 family history of colon polyps in patient's mother  Plan; We reviewed in antireflux regimen, including nothing by mouth for 3 hours prior to bedtime and  elevation of the head of the bed at least 45. She was provided with an anti-reflux diet and additional lifestyle management Will start omeprazole 40 mg by mouth every morning-would hope to treat her for 3-4 months until symptoms are under good control and then wean back to H2 blocker Schedule for EGD and colonoscopy with Dr. Ardis Hughs. Both procedures were discussed in detail with the patient including risks and benefits and she is agreeable to proceed. Office follow-up with myself in 2-3 months.  Amy S Esterwood PA-C 09/16/2016   Cc: Wilber Bihari Cornett*

## 2016-09-16 NOTE — Progress Notes (Signed)
I agree with the above note, plan 

## 2016-09-17 ENCOUNTER — Telehealth: Payer: Self-pay | Admitting: *Deleted

## 2016-09-17 ENCOUNTER — Other Ambulatory Visit: Payer: Self-pay | Admitting: *Deleted

## 2016-09-17 DIAGNOSIS — M542 Cervicalgia: Secondary | ICD-10-CM

## 2016-09-17 MED ORDER — TRAMADOL HCL 50 MG PO TABS: 50.0000 mg | ORAL_TABLET | Freq: Four times a day (QID) | ORAL | 0 refills | Status: DC | PRN

## 2016-09-17 NOTE — Telephone Encounter (Signed)
Refill called to [facility] on [address]

## 2016-09-17 NOTE — Telephone Encounter (Signed)
Patient called requesting refill of tramadol.

## 2016-09-22 ENCOUNTER — Telehealth: Payer: Self-pay | Admitting: Emergency Medicine

## 2016-09-22 NOTE — Telephone Encounter (Signed)
Spoke with patient; she was seen in the ED over the weekend and treated for bronchial pneumonia with a Zpak. Patient states she is feeling better today; states the breathing and pain is better today. Advised patient to keep all future appointments as scheduled and to call this office for any changes or worsening. Patient verbalized understanding.

## 2016-09-26 ENCOUNTER — Ambulatory Visit: Payer: BLUE CROSS/BLUE SHIELD | Admitting: Adult Health

## 2016-09-30 ENCOUNTER — Encounter: Payer: Self-pay | Admitting: Adult Health

## 2016-09-30 ENCOUNTER — Other Ambulatory Visit: Payer: Self-pay | Admitting: Oncology

## 2016-09-30 ENCOUNTER — Other Ambulatory Visit: Payer: Self-pay

## 2016-09-30 ENCOUNTER — Ambulatory Visit (HOSPITAL_BASED_OUTPATIENT_CLINIC_OR_DEPARTMENT_OTHER): Payer: BLUE CROSS/BLUE SHIELD | Admitting: Adult Health

## 2016-09-30 ENCOUNTER — Other Ambulatory Visit (HOSPITAL_BASED_OUTPATIENT_CLINIC_OR_DEPARTMENT_OTHER): Payer: BLUE CROSS/BLUE SHIELD

## 2016-09-30 ENCOUNTER — Ambulatory Visit (HOSPITAL_BASED_OUTPATIENT_CLINIC_OR_DEPARTMENT_OTHER): Payer: BLUE CROSS/BLUE SHIELD

## 2016-09-30 VITALS — BP 133/81 | HR 85 | Temp 98.1°F | Resp 18 | Wt 206.3 lb

## 2016-09-30 DIAGNOSIS — Z5112 Encounter for antineoplastic immunotherapy: Secondary | ICD-10-CM | POA: Diagnosis not present

## 2016-09-30 DIAGNOSIS — Z170 Estrogen receptor positive status [ER+]: Secondary | ICD-10-CM | POA: Diagnosis not present

## 2016-09-30 DIAGNOSIS — C50412 Malignant neoplasm of upper-outer quadrant of left female breast: Secondary | ICD-10-CM

## 2016-09-30 LAB — CBC WITH DIFFERENTIAL/PLATELET
BASO%: 0.3 % (ref 0.0–2.0)
Basophils Absolute: 0 10*3/uL (ref 0.0–0.1)
EOS%: 4.7 % (ref 0.0–7.0)
Eosinophils Absolute: 0.3 10*3/uL (ref 0.0–0.5)
HCT: 32.2 % — ABNORMAL LOW (ref 34.8–46.6)
HGB: 10.9 g/dL — ABNORMAL LOW (ref 11.6–15.9)
LYMPH%: 19.2 % (ref 14.0–49.7)
MCH: 29.8 pg (ref 25.1–34.0)
MCHC: 33.8 g/dL (ref 31.5–36.0)
MCV: 88.2 fL (ref 79.5–101.0)
MONO#: 0.5 10*3/uL (ref 0.1–0.9)
MONO%: 8.5 % (ref 0.0–14.0)
NEUT#: 3.7 10*3/uL (ref 1.5–6.5)
NEUT%: 67.3 % (ref 38.4–76.8)
Platelets: 297 10*3/uL (ref 145–400)
RBC: 3.65 10*6/uL — ABNORMAL LOW (ref 3.70–5.45)
RDW: 13.6 % (ref 11.2–14.5)
WBC: 5.6 10*3/uL (ref 3.9–10.3)
lymph#: 1.1 10*3/uL (ref 0.9–3.3)

## 2016-09-30 LAB — COMPREHENSIVE METABOLIC PANEL
ALT: 14 U/L (ref 0–55)
AST: 16 U/L (ref 5–34)
Albumin: 3.6 g/dL (ref 3.5–5.0)
Alkaline Phosphatase: 95 U/L (ref 40–150)
Anion Gap: 8 mEq/L (ref 3–11)
BUN: 11.6 mg/dL (ref 7.0–26.0)
CO2: 25 mEq/L (ref 22–29)
Calcium: 9.6 mg/dL (ref 8.4–10.4)
Chloride: 107 mEq/L (ref 98–109)
Creatinine: 0.9 mg/dL (ref 0.6–1.1)
EGFR: >90 mL/min/{1.73_m2} (ref >90)
Glucose: 108 mg/dl (ref 70–140)
Potassium: 4 mEq/L (ref 3.5–5.1)
Sodium: 140 mEq/L (ref 136–145)
Total Bilirubin: 0.31 mg/dL (ref 0.20–1.20)
Total Protein: 7.5 g/dL (ref 6.4–8.3)

## 2016-09-30 MED ORDER — METHYLPREDNISOLONE 4 MG PO TBPK: ORAL_TABLET | ORAL | 0 refills | Status: DC

## 2016-09-30 MED ORDER — DIPHENHYDRAMINE HCL 25 MG PO CAPS
25.0000 mg | ORAL_CAPSULE | Freq: Once | ORAL | Status: AC
  Administered 2016-09-30: 25 mg via ORAL

## 2016-09-30 MED ORDER — SODIUM CHLORIDE 0.9% FLUSH
10.0000 mL | INTRAVENOUS | Status: DC | PRN
  Administered 2016-09-30: 10 mL
  Filled 2016-09-30: qty 10

## 2016-09-30 MED ORDER — DIPHENHYDRAMINE HCL 25 MG PO CAPS
ORAL_CAPSULE | ORAL | Status: AC
  Filled 2016-09-30: qty 1

## 2016-09-30 MED ORDER — TRASTUZUMAB CHEMO 150 MG IV SOLR
6.0000 mg/kg | Freq: Once | INTRAVENOUS | Status: AC
  Administered 2016-09-30: 504 mg via INTRAVENOUS
  Filled 2016-09-30: qty 24

## 2016-09-30 MED ORDER — TAMOXIFEN CITRATE 20 MG PO TABS: 20.0000 mg | ORAL_TABLET | Freq: Every day | ORAL | 5 refills | Status: DC

## 2016-09-30 MED ORDER — SODIUM CHLORIDE 0.9 % IV SOLN
Freq: Once | INTRAVENOUS | Status: AC
  Administered 2016-09-30: 14:00:00 via INTRAVENOUS

## 2016-09-30 MED ORDER — HEPARIN SOD (PORK) LOCK FLUSH 100 UNIT/ML IV SOLN
500.0000 [IU] | Freq: Once | INTRAVENOUS | Status: AC | PRN
  Administered 2016-09-30: 500 [IU]
  Filled 2016-09-30: qty 5

## 2016-09-30 MED ORDER — ACETAMINOPHEN 325 MG PO TABS
ORAL_TABLET | ORAL | Status: AC
  Filled 2016-09-30: qty 2

## 2016-09-30 MED ORDER — LIDOCAINE-PRILOCAINE 2.5-2.5 % EX CREA: 1.0000 "application " | TOPICAL_CREAM | CUTANEOUS | 1 refills | Status: DC | PRN

## 2016-09-30 MED ORDER — ACETAMINOPHEN 325 MG PO TABS
650.0000 mg | ORAL_TABLET | Freq: Once | ORAL | Status: AC
  Administered 2016-09-30: 650 mg via ORAL

## 2016-09-30 NOTE — Patient Instructions (Signed)
[facility] Discharge Instructions for Patients Receiving Chemotherapy  Today you received the following chemotherapy agents: Herceptin   To help prevent nausea and vomiting after your treatment, we encourage you to take your nausea medication as directed.    If you develop nausea and vomiting that is not controlled by your nausea medication, call the clinic.   BELOW ARE SYMPTOMS THAT SHOULD BE REPORTED IMMEDIATELY:  *FEVER GREATER THAN 100.5 F  *CHILLS WITH OR WITHOUT FEVER  NAUSEA AND VOMITING THAT IS NOT CONTROLLED WITH YOUR NAUSEA MEDICATION  *UNUSUAL SHORTNESS OF BREATH  *UNUSUAL BRUISING OR BLEEDING  TENDERNESS IN MOUTH AND THROAT WITH OR WITHOUT PRESENCE OF ULCERS  *URINARY PROBLEMS  *BOWEL PROBLEMS  UNUSUAL RASH Items with * indicate a potential emergency and should be followed up as soon as possible.  Feel free to call the clinic you have any questions or concerns. The clinic phone number is [phone].  Please show the CHEMO ALERT CARD at check-in to the Emergency Department and triage nurse.   

## 2016-09-30 NOTE — Progress Notes (Signed)
[place]  [phone] [phone]     ID: JENNI THEW DOB: May 22, 1976  MR#: [id number]  VVK#:[id number]  Patient Care Team: No Pcp Per Patient as PCP - General (General Practice) [name], MD as Consulting Physician (Oncology) [name], MD as Consulting Physician (General Surgery) [name], MD as Consulting Physician (Obstetrics and Gynecology) PCP: No PCP Per Patient OTHER MD:  CHIEF COMPLAINT: HER-2 positive, Estrogen receptor moderately positive breast cancer  CURRENT TREATMENT: anti-HER-2 immunotherapy; adjuvant radiation   BREAST CANCER HISTORY: From the original intake note:  Cortney woke up the morning of 11/09/2015 with some pain in her left axilla. She examined herself and found a lump in her left breast. She saw her gynecologist the same day, and she confirms a lump. The patient then proceeded directly to mammography. I do not have that report. However it did show the mass. [name] was then scheduled for ultrasound-guided biopsy 11/21/2015. This showed ([place] [id number] at the [employer]) and invasive ductal carcinoma, grade 2 estrogen receptor positive, with moderate intensity (10-50%), progesterone receptor negative, and HER-2 equivocal by immunohistochemistry. Fish was obtained and was equivocal as well, with a signals ratio of 1.8 to, the number per cell being 5.7.  On 12/05/2015 the patient had a CT/ angiogram of the chest for evaluation of her left-sided chest pain. This showed no evidence of a clot. The left breast nodule measured 1.3 cm on the study. There was no enlarged axillary adenopathy. There was also no evidence of lung metastasis or blastic or destructive lytic bone lesions. The upper abdomen was unremarkable.  With that information the patient presents for further evaluation and treatment.  INTERVAL HISTORY: Emberley is here today after finishing up radiation and  prior to starting Tamoxifen.  She is doing well.  She denies any chest pain, palpitations, or shortness of breath.  She did go to ER due to pleuritic chest pain a few weeks ago and was given another course of Azithromycin however it hasn't improved.  The only thing that helps her pleuritic chest pain is diclofenac and Tramadol.  She did undergo CTA at hospital that was otherwise normal according to [facility].    REVIEW OF SYSTEMS: Zahraa denies fevers, chills, nausea, vomiting, chest pain, palpitations, DOE, orthopnea, swelling of her lower extremities or any other concerns.    PAST MEDICAL HISTORY: Past Medical History:  Diagnosis Date  . Allergy   . Anxiety    has panic attacks  . Asthma    childhood, none now  . Breast cancer ([place]) 11/20/15   left breast  . Depression   . Endometriosis   . Family history of breast cancer   . GERD (gastroesophageal reflux disease)   . Herniated disc, cervical   . History of bronchitis as a child   . History of cancer chemotherapy    left breast  . History of stomach ulcers   . History of urinary tract infection   . Hormone disorder   . IBD (inflammatory bowel disease)   . Migraine without aura   . Neck pain   . PONV (postoperative nausea and vomiting)   . Shortness of breath dyspnea    anxiety related    PAST SURGICAL HISTORY: Past Surgical History:  Procedure Laterality Date  . ABDOMINAL HYSTERECTOMY    . laparoscopically assisted vaginal hysterectomy  01/2006   Still has ovaries  . LAPAROSCOPY     x 2, diagnosed with  endometriosis (prior to hysterectomy), only treated medically.  Marland Kitchen MASTOPEXY Bilateral 05/20/2016   Procedure: MASTOPEXY;  Surgeon: Irene Limbo, MD;  Location: [place];  Service: Plastics;  Laterality: Bilateral;  . PORTACATH PLACEMENT Right 12/25/2015   Procedure: INSERTION PORT-A-CATH WITH ULTRA SOUND;  Surgeon: [name], MD;  Location: [facility];  Service: General;  Laterality: Right;  .  RADIOACTIVE SEED GUIDED MASTECTOMY WITH AXILLARY SENTINEL LYMPH NODE BIOPSY Left 05/12/2016   Procedure: BREAST LUMPECTOMY WITH RADIOACTIVE SEED AND SENTINEL LYMPH NODE BIOPSY AND BLUE DYE INJECTION;  Surgeon: [name], MD;  Location: [place];  Service: General;  Laterality: Left;    FAMILY HISTORY Family History  Problem Relation Age of Onset  . Sarcoidosis Mother   . Hypertension Father   . Throat cancer Maternal Grandfather     smoker and heavy drinker; dx in his late 43s-50s  . Cancer Paternal Grandmother     possible gastric vs bladder cancer  . Bladder Cancer Paternal Grandmother   . Breast cancer Paternal Aunt     dxin her 43s; dad's maternal half sister  . Breast cancer Other     PGFs mother  . Anesthesia problems Neg Hx   . Hypotension Neg Hx   . Malignant hyperthermia Neg Hx   . Pseudochol deficiency Neg Hx   . Colon cancer Neg Hx   . Stomach cancer Neg Hx   . Rectal cancer Neg Hx   . Esophageal cancer Neg Hx   . Liver cancer Neg Hx   The patient's parents are both living, in their early 50s. The patient has one brother and one sister. On the father's side 1 great grandmother was diagnosed with breast cancer in her 43s. The patient's father's mother was diagnosed with stomach cancer. One of the patient's father's sisters was diagnosed with breast cancer in her 20s. On the maternal side patient's mother's father was diagnosed with throat cancer at age.   GYNECOLOGIC HISTORY:  No LMP recorded. Patient has had a hysterectomy.  menarche age 41, first live birth age 41. The patient is GX P1. She stopped having periods in 2008, when she underwent a simple hysterectomy without salpingo-oophorectomy. She has a history of endometriosis and was started on Provera in March of this year, with her next dose due next week. (However she has decided to forego further Provera treatments at least for now).   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 He is licensed in [place] but not in [place]. He is now in this area looking for a job. The patient's son [name] is a Ship broker in music at [place]: Not in place   HEALTH MAINTENANCE: Social History  Substance Use Topics  . Smoking status: Never Smoker  . Smokeless tobacco: Never Used  . Alcohol use 0.0 oz/week     Comment: socially      Colonoscopy:  PAP:  Bone density:  Lipid panel:  Allergies  Allergen Reactions  . Amoxicillin Shortness Of Breath and Itching    Has patient had a PCN reaction causing immediate rash, facial/tongue/throat swelling, SOB or lightheadedness with hypotension: Yes Has patient had a PCN reaction causing severe rash involving mucus membranes or skin necrosis: No Has patient had a PCN reaction that required hospitalization Yes Has patient had a  PCN reaction occurring within the last 10 years: No If all of the above answers are "NO", then may proceed with Cephalosporin use.   . Diflucan [Fluconazole] Nausea Only    heartburn    Current Outpatient Prescriptions  Medication Sig Dispense Refill  . diclofenac (VOLTAREN) 50 MG EC tablet Take 1 tablet by mouth 2 (two) times daily as needed.  0  . hyaluronate sodium (RADIAPLEXRX) GEL Apply 1 application topically 2 (two) times daily.    Marland Kitchen lidocaine-prilocaine (EMLA) cream Apply 1 application topically as needed. 1 hour before port access, cover with plastic wrap 30 g 1  . methocarbamol (ROBAXIN) 500 MG tablet Take 500 mg by mouth every 8 (eight) hours as needed.  1  . non-metallic deodorant (ALRA) MISC Apply 1 application topically daily as needed.    Marland Kitchen omeprazole (PRILOSEC) 40 MG capsule Take 1 capsule (40 mg total) by mouth daily. 30 MINUTES BEFORE BREAKFAST 30 capsule 11  . sertraline (ZOLOFT) 100 MG tablet Take 1 tablet (100 mg  total) by mouth daily. 30 tablet 3  . traMADol (ULTRAM) 50 MG tablet Take 1 tablet (50 mg total) by mouth every 6 (six) hours as needed. 60 tablet 0  . zolpidem (AMBIEN) 10 MG tablet Take 1/2 to 1 tab po qhs prn 30 tablet 1   No current facility-administered medications for this visit.     OBJECTIVE:   Vitals:   09/30/16 1257  BP: 133/81  Pulse: 85  Resp: 18  Temp: 98.1 F (36.7 C)     Body mass index is 35.41 kg/m.    ECOG FS:1 - Symptomatic but completely ambulatory Filed Weights   09/30/16 1257  Weight: 206 lb 4.8 oz (93.6 kg)   GENERAL: Patient is a well appearing female in no acute distress HEENT:  Sclerae anicteric. PERRL. Oropharynx clear and moist. No ulcerations or evidence of oropharyngeal candidiasis. Neck is supple.  NODES:  No cervical, supraclavicular, or axillary lymphadenopathy palpated.  BREAST EXAM:  Deferred. LUNGS:  Clear to auscultation bilaterally.  No wheezes or rhonchi. HEART:  Regular rate and rhythm. No murmur appreciated. No JVD ABDOMEN:  Soft, nontender.  Positive, normoactive bowel sounds. No organomegaly palpated. MSK:  No focal spinal tenderness to palpation. Full range of motion bilaterally in the upper extremities. EXTREMITIES:  No peripheral edema.   SKIN:  Clear with no obvious rashes or skin changes. No nail dyscrasia. NEURO:  Nonfocal. Well oriented.  Appropriate affect.   Marland Kitchen  LAB RESULTS:  CMP     Component Value Date/Time   NA 139 09/09/2016 1533   K 3.6 09/09/2016 1533   CL 107 12/04/2015 1858   CO2 24 09/09/2016 1533   GLUCOSE 99 09/09/2016 1533   BUN 11.3 09/09/2016 1533   CREATININE 0.9 09/09/2016 1533   CALCIUM 9.1 09/09/2016 1533   PROT 7.4 09/09/2016 1533   ALBUMIN 3.7 09/09/2016 1533   AST 16 09/09/2016 1533   ALT 15 09/09/2016 1533   ALKPHOS 97 09/09/2016 1533   BILITOT <0.2 09/09/2016 1533   GFRNONAA >60 12/04/2015 1858   GFRAA >60 12/04/2015 1858    INo results found for: SPEP, UPEP  Lab Results  Component  Value Date   WBC 5.6 09/30/2016   NEUTROABS 3.7 09/30/2016   HGB 10.9 (L) 09/30/2016   HCT 32.2 (L) 09/30/2016   MCV 88.2 09/30/2016   PLT 297 09/30/2016      Chemistry      Component Value Date/Time   NA 139 09/09/2016  1533   K 3.6 09/09/2016 1533   CL 107 12/04/2015 1858   CO2 24 09/09/2016 1533   BUN 11.3 09/09/2016 1533   CREATININE 0.9 09/09/2016 1533      Component Value Date/Time   CALCIUM 9.1 09/09/2016 1533   ALKPHOS 97 09/09/2016 1533   AST 16 09/09/2016 1533   ALT 15 09/09/2016 1533   BILITOT <0.2 09/09/2016 1533       No results found for: LABCA2  No components found for: LABCA125  No results for input(s): INR in the last 168 hours.  Urinalysis    Component Value Date/Time   COLORURINE STRAW (A) 12/11/2008 1923   APPEARANCEUR CLEAR 12/11/2008 1923   LABSPEC 1.020 12/11/2008 1923   PHURINE 6.0 12/11/2008 1923   GLUCOSEU NEGATIVE 12/11/2008 1923   HGBUR NEGATIVE 12/11/2008 1923   BILIRUBINUR NEGATIVE 12/11/2008 1923   KETONESUR NEGATIVE 12/11/2008 1923   PROTEINUR NEGATIVE 12/11/2008 1923   UROBILINOGEN 0.2 12/11/2008 1923   NITRITE NEGATIVE 12/11/2008 1923   LEUKOCYTESUR  12/11/2008 1923    NEGATIVE MICROSCOPIC NOT DONE ON URINES WITH NEGATIVE PROTEIN, BLOOD, LEUKOCYTES, NITRITE, OR GLUCOSE <1000 mg/dL.      ELIGIBLE FOR AVAILABLE RESEARCH PROTOCOL: no  STUDIES: No results found.  ASSESSMENT: 41 y.o. [place] woman status post left breast upper outer quadrant biopsy 11/20/2015 for a clinical T1c N0, stage IA invasive ductal carcinoma, grade 2, estrogen receptor moderately positive, progesterone receptor negative, HER-2 equivocal by both immunohistochemistry and FISH  (1) neoadjuvant tamoxifen started 12/11/2015, stopped at the start of chemotherapy  (2) genetics testing 01/03/2016 through the Breast/Ovarian gene panel offered by [facility] found no deleterious mutations in ATM, BARD1, BRCA1, BRCA2, BRIP1, CDH1, CHEK2, EPCAM, FANCC, MLH1, MSH2,  MSH6, NBN, PALB2, PMS2, PTEN, RAD51C, RAD51D, TP53, and XRCC2  (3) neoadjuvant chemotherapy consisting of carboplatin, docetaxel, trastuzumab and pertuzumab every 21 days 6 starting 01/01/2016, completed 04/15/2016  (4) trastuzumab to be continued to complete a year (through June 2018)  (a echocardiogram 07/24/2016) Shows an ejection fraction of 60-65%.   (5) left lumpectomy and sentinel lymph node sampling 05/12/2016 showed a complete pathologic response (ypT0, ypN0)  (a) Status post bilateral mastopexy 05/20/2016  (6) adjuvant radiation from 07/28/2016 to 09/11/2016, left breast 50.4 Gy in 28 fractions, left breast boost 10 Gy in 5 fractions  (7) anti-estrogens to be continued 5-10 years, more likely the latter    PLAN: Joelene will proceed with Trastuzumab today.  She is doing well.  I went ahead and gave her a medrol dosepak for this pain.  Hopefully it will help.  I counseled her to take it with a good breakfast first thing in the morning, and to call me for any stomach pain whatsoever while taking it.  She will return every three weeks for continued Trastuzumab.  She will restart Tamoxifen and I reviewed this with her in detail.  Dr. Jana Hakim is aware of this and her current medication list.  She will return in three months for f/u with Dr. Jana Hakim. She is due for follow up with Dr. Haroldine Laws in the next month, I will request this be set up today.      A total of (30) minutes of face-to-face time was spent with this patient with greater than 50% of that time in counseling and care-coordination.    Scot Dock, NP   09/30/2016 1:09 PM Medical Oncology and Hematology [facility] [address] [place], [place] [address] Tel. [phone]    Fax. [phone]

## 2016-10-01 ENCOUNTER — Telehealth (HOSPITAL_COMMUNITY): Payer: Self-pay | Admitting: Vascular Surgery

## 2016-10-01 NOTE — Telephone Encounter (Signed)
Left pt message to make brst appt w/ [name]

## 2016-10-02 ENCOUNTER — Other Ambulatory Visit: Payer: Self-pay | Admitting: *Deleted

## 2016-10-02 DIAGNOSIS — Z171 Estrogen receptor negative status [ER-]: Secondary | ICD-10-CM

## 2016-10-02 DIAGNOSIS — C50219 Malignant neoplasm of upper-inner quadrant of unspecified female breast: Secondary | ICD-10-CM

## 2016-10-02 MED ORDER — ZOLPIDEM TARTRATE 10 MG PO TABS: ORAL_TABLET | ORAL | 1 refills | Status: DC

## 2016-10-06 ENCOUNTER — Ambulatory Visit (INDEPENDENT_AMBULATORY_CARE_PROVIDER_SITE_OTHER): Payer: BLUE CROSS/BLUE SHIELD | Admitting: Podiatry

## 2016-10-06 ENCOUNTER — Encounter: Payer: Self-pay | Admitting: Podiatry

## 2016-10-06 DIAGNOSIS — M79675 Pain in left toe(s): Secondary | ICD-10-CM

## 2016-10-06 DIAGNOSIS — L03032 Cellulitis of left toe: Secondary | ICD-10-CM

## 2016-10-06 DIAGNOSIS — L600 Ingrowing nail: Secondary | ICD-10-CM | POA: Diagnosis not present

## 2016-10-06 DIAGNOSIS — M79672 Pain in left foot: Secondary | ICD-10-CM

## 2016-10-06 NOTE — Patient Instructions (Signed)
Ingrown nail surgery was done on left great toe medial border. Follow soaking instruction.  Some redness and drainage is expected. Call the office if the area gets feverish with increased redness and drainage. Return in one week.  

## 2016-10-06 NOTE — Progress Notes (Signed)
SUBJECTIVE: 41 y.o.year old femalepresents with painful ingrown nail for duration of a week. This is the same site we trimmed and debrided in December 2017.  HPI: Her last Chemotherapy was 4 weeks ago for Breast Cancer. Diagnosed with breast cancer since May 2017.   OBJECTIVE: DERMATOLOGIC EXAMINATION: Painful ingrown nail left great toe medial border.  VASCULAR EXAMINATION OF LOWER LIMBS: Pedal pulses: All pedal pulses are palpable with normal pulsation.  Temperature gradient from tibial crest to dorsum of foot is within normal bilateral.  NEUROLOGIC EXAMINATION OF THE LOWER LIMBS: All epicritic and tactile sensations grossly intact.  MUSCULOSKELETAL EXAMINATION: Rectus foot without gross deformities.  ASSESSMENT: Ingrown nail left great toe medial border with proud flesh.  PLAN: Reviewed clinical findings and available treatment options. P&A matrixectomy left great toe medial border done under local.  Local used with 50/50 mixture 0.5% Marcaine plain and 1% Xylocaine with epinephrine. Patient tolerated well and return appointment with dressing supply.

## 2016-10-08 ENCOUNTER — Encounter (HOSPITAL_COMMUNITY): Payer: Self-pay | Admitting: Vascular Surgery

## 2016-10-08 ENCOUNTER — Telehealth (HOSPITAL_COMMUNITY): Payer: Self-pay | Admitting: Vascular Surgery

## 2016-10-08 NOTE — Telephone Encounter (Signed)
Left pt message to make f/u appt w/ [facility] w/ ECHO

## 2016-10-13 ENCOUNTER — Other Ambulatory Visit: Payer: Self-pay | Admitting: *Deleted

## 2016-10-13 ENCOUNTER — Ambulatory Visit (INDEPENDENT_AMBULATORY_CARE_PROVIDER_SITE_OTHER): Payer: BLUE CROSS/BLUE SHIELD | Admitting: Podiatry

## 2016-10-13 ENCOUNTER — Encounter: Payer: Self-pay | Admitting: Podiatry

## 2016-10-13 DIAGNOSIS — L03032 Cellulitis of left toe: Secondary | ICD-10-CM

## 2016-10-13 MED ORDER — SERTRALINE HCL 100 MG PO TABS: 100.0000 mg | ORAL_TABLET | Freq: Every day | ORAL | 3 refills | Status: DC

## 2016-10-13 NOTE — Progress Notes (Signed)
1 week post op left great toe nail medial border surgery. Wound is clean and dry. No complaints. Continue soak till the area gets completely dry. Return as needed.

## 2016-10-13 NOTE — Patient Instructions (Signed)
1 week post op left great toe nail medial border surgery. Wound is clean and dry. No complaints. Continue soak till the area gets completely dry. Return as needed.

## 2016-10-14 ENCOUNTER — Telehealth: Payer: Self-pay | Admitting: *Deleted

## 2016-10-16 ENCOUNTER — Other Ambulatory Visit: Payer: Self-pay | Admitting: *Deleted

## 2016-10-16 MED ORDER — SERTRALINE HCL 100 MG PO TABS: 100.0000 mg | ORAL_TABLET | Freq: Every day | ORAL | 3 refills | Status: DC

## 2016-10-20 ENCOUNTER — Encounter: Payer: Self-pay | Admitting: Gastroenterology

## 2016-10-21 ENCOUNTER — Other Ambulatory Visit: Payer: Self-pay | Admitting: *Deleted

## 2016-10-21 ENCOUNTER — Ambulatory Visit (HOSPITAL_COMMUNITY)
Admission: RE | Admit: 2016-10-21 | Discharge: 2016-10-21 | Disposition: A | Discharge status: Home / Self Care | Payer: BLUE CROSS/BLUE SHIELD | Source: Ambulatory Visit | Attending: Oncology | Admitting: Oncology

## 2016-10-21 ENCOUNTER — Other Ambulatory Visit (HOSPITAL_BASED_OUTPATIENT_CLINIC_OR_DEPARTMENT_OTHER): Payer: BLUE CROSS/BLUE SHIELD

## 2016-10-21 ENCOUNTER — Ambulatory Visit (HOSPITAL_BASED_OUTPATIENT_CLINIC_OR_DEPARTMENT_OTHER): Payer: BLUE CROSS/BLUE SHIELD

## 2016-10-21 ENCOUNTER — Encounter: Payer: Self-pay | Admitting: Pharmacist

## 2016-10-21 VITALS — BP 140/89 | HR 78 | Temp 98.1°F | Resp 18

## 2016-10-21 DIAGNOSIS — C50412 Malignant neoplasm of upper-outer quadrant of left female breast: Secondary | ICD-10-CM

## 2016-10-21 DIAGNOSIS — Z452 Encounter for adjustment and management of vascular access device: Secondary | ICD-10-CM | POA: Diagnosis not present

## 2016-10-21 DIAGNOSIS — Z170 Estrogen receptor positive status [ER+]: Secondary | ICD-10-CM

## 2016-10-21 DIAGNOSIS — Z5112 Encounter for antineoplastic immunotherapy: Secondary | ICD-10-CM | POA: Diagnosis not present

## 2016-10-21 LAB — CBC WITH DIFFERENTIAL/PLATELET
BASO%: 0.5 % (ref 0.0–2.0)
Basophils Absolute: 0 10*3/uL (ref 0.0–0.1)
EOS%: 3.6 % (ref 0.0–7.0)
Eosinophils Absolute: 0.2 10*3/uL (ref 0.0–0.5)
HCT: 32.3 % — ABNORMAL LOW (ref 34.8–46.6)
HGB: 10.9 g/dL — ABNORMAL LOW (ref 11.6–15.9)
LYMPH%: 17 % (ref 14.0–49.7)
MCH: 29.9 pg (ref 25.1–34.0)
MCHC: 33.8 g/dL (ref 31.5–36.0)
MCV: 88.3 fL (ref 79.5–101.0)
MONO#: 0.6 10*3/uL (ref 0.1–0.9)
MONO%: 9.1 % (ref 0.0–14.0)
NEUT#: 4.7 10*3/uL (ref 1.5–6.5)
NEUT%: 69.8 % (ref 38.4–76.8)
Platelets: 233 10*3/uL (ref 145–400)
RBC: 3.66 10*6/uL — ABNORMAL LOW (ref 3.70–5.45)
RDW: 14.4 % (ref 11.2–14.5)
WBC: 6.7 10*3/uL (ref 3.9–10.3)
lymph#: 1.1 10*3/uL (ref 0.9–3.3)

## 2016-10-21 LAB — COMPREHENSIVE METABOLIC PANEL
ALT: 17 U/L (ref 0–55)
AST: 17 U/L (ref 5–34)
Albumin: 3.5 g/dL (ref 3.5–5.0)
Alkaline Phosphatase: 87 U/L (ref 40–150)
Anion Gap: 9 mEq/L (ref 3–11)
BUN: 13.8 mg/dL (ref 7.0–26.0)
CO2: 24 mEq/L (ref 22–29)
Calcium: 9.3 mg/dL (ref 8.4–10.4)
Chloride: 108 mEq/L (ref 98–109)
Creatinine: 0.9 mg/dL (ref 0.6–1.1)
EGFR: >90 mL/min/{1.73_m2} (ref >90)
Glucose: 103 mg/dl (ref 70–140)
Potassium: 4 mEq/L (ref 3.5–5.1)
Sodium: 141 mEq/L (ref 136–145)
Total Bilirubin: 0.27 mg/dL (ref 0.20–1.20)
Total Protein: 7.3 g/dL (ref 6.4–8.3)

## 2016-10-21 MED ORDER — DIPHENHYDRAMINE HCL 25 MG PO CAPS
25.0000 mg | ORAL_CAPSULE | Freq: Once | ORAL | Status: AC
  Administered 2016-10-21: 25 mg via ORAL

## 2016-10-21 MED ORDER — HEPARIN SOD (PORK) LOCK FLUSH 100 UNIT/ML IV SOLN
500.0000 [IU] | Freq: Once | INTRAVENOUS | Status: AC | PRN
  Administered 2016-10-21: 500 [IU]
  Filled 2016-10-21: qty 5

## 2016-10-21 MED ORDER — DIPHENHYDRAMINE HCL 25 MG PO CAPS
ORAL_CAPSULE | ORAL | Status: AC
  Filled 2016-10-21: qty 1

## 2016-10-21 MED ORDER — ACETAMINOPHEN 325 MG PO TABS
ORAL_TABLET | ORAL | Status: AC
  Filled 2016-10-21: qty 2

## 2016-10-21 MED ORDER — TRASTUZUMAB CHEMO 150 MG IV SOLR
6.0000 mg/kg | Freq: Once | INTRAVENOUS | Status: AC
  Administered 2016-10-21: 504 mg via INTRAVENOUS
  Filled 2016-10-21: qty 24

## 2016-10-21 MED ORDER — ACETAMINOPHEN 325 MG PO TABS
650.0000 mg | ORAL_TABLET | Freq: Once | ORAL | Status: AC
  Administered 2016-10-21: 650 mg via ORAL

## 2016-10-21 MED ORDER — SODIUM CHLORIDE 0.9 % IV SOLN
Freq: Once | INTRAVENOUS | Status: AC
  Administered 2016-10-21: 13:00:00 via INTRAVENOUS

## 2016-10-21 MED ORDER — ALTEPLASE 2 MG IJ SOLR
2.0000 mg | Freq: Once | INTRAMUSCULAR | Status: AC | PRN
  Administered 2016-10-21: 2 mg
  Filled 2016-10-21: qty 2

## 2016-10-21 MED ORDER — SODIUM CHLORIDE 0.9% FLUSH
10.0000 mL | INTRAVENOUS | Status: DC | PRN
  Administered 2016-10-21: 10 mL
  Filled 2016-10-21: qty 10

## 2016-10-21 NOTE — Progress Notes (Signed)
Consent documentation  Study code: [id number]  Met with patient during her infusion appointment on 10/21/16 at 1200. Patient was alone during this meeting. Provided an overview of the "Pharmacogenetic analysis of toxicities related to administration of taxanes in breast cancer patients" study.  Consent form was reviewed with the patient (reviewed the study purpose, patient's role, possible side effects, cost, risk, information protection) All of the patient's questions were answered and she agreed to participate in the study. A signed copy of the consent form was given to the patient. All eligibility criteria have been met and patient has been enrolled in the study.  Study sample was collected via buccal swab after consent was obtained. Patient was informed that the sample would be sent to the lab for processing after samples were collected from 25 patients and that it would take approximately 2 weeks for the lab to process that sample.   Met with the patient for 15 minutes.  Darl Pikes, PharmD, [place] Clinical Pharmacist- Oncology Pharmacy Resident

## 2016-10-21 NOTE — Progress Notes (Signed)
Pt complained of chest pain while breathing on assessment, spoke with Elmyra Ricks RN, chest x-ray scheduled after infusion today, and pt to see Dr Jana Hakim after chest x-ray. Pt notified and verbalized understanding.

## 2016-10-21 NOTE — Patient Instructions (Signed)
[facility] Discharge Instructions for Patients Receiving Chemotherapy  Today you received the following chemotherapy agents: Herceptin   To help prevent nausea and vomiting after your treatment, we encourage you to take your nausea medication as directed.    If you develop nausea and vomiting that is not controlled by your nausea medication, call the clinic.   BELOW ARE SYMPTOMS THAT SHOULD BE REPORTED IMMEDIATELY:  *FEVER GREATER THAN 100.5 F  *CHILLS WITH OR WITHOUT FEVER  NAUSEA AND VOMITING THAT IS NOT CONTROLLED WITH YOUR NAUSEA MEDICATION  *UNUSUAL SHORTNESS OF BREATH  *UNUSUAL BRUISING OR BLEEDING  TENDERNESS IN MOUTH AND THROAT WITH OR WITHOUT PRESENCE OF ULCERS  *URINARY PROBLEMS  *BOWEL PROBLEMS  UNUSUAL RASH Items with * indicate a potential emergency and should be followed up as soon as possible.  Feel free to call the clinic you have any questions or concerns. The clinic phone number is [phone].  Please show the CHEMO ALERT CARD at check-in to the Emergency Department and triage nurse.   

## 2016-10-28 ENCOUNTER — Encounter (HOSPITAL_COMMUNITY): Payer: Self-pay

## 2016-10-28 ENCOUNTER — Ambulatory Visit (HOSPITAL_BASED_OUTPATIENT_CLINIC_OR_DEPARTMENT_OTHER)
Admission: RE | Admit: 2016-10-28 | Discharge: 2016-10-28 | Disposition: A | Discharge status: Home / Self Care | Payer: BLUE CROSS/BLUE SHIELD | Source: Ambulatory Visit | Attending: Cardiology | Admitting: Cardiology

## 2016-10-28 ENCOUNTER — Ambulatory Visit (HOSPITAL_COMMUNITY)
Admission: RE | Admit: 2016-10-28 | Discharge: 2016-10-28 | Disposition: A | Discharge status: Home / Self Care | Payer: BLUE CROSS/BLUE SHIELD | Source: Ambulatory Visit | Attending: Oncology | Admitting: Oncology

## 2016-10-28 VITALS — BP 127/73 | HR 82 | Wt 214.5 lb

## 2016-10-28 DIAGNOSIS — J45909 Unspecified asthma, uncomplicated: Secondary | ICD-10-CM | POA: Insufficient documentation

## 2016-10-28 DIAGNOSIS — F419 Anxiety disorder, unspecified: Secondary | ICD-10-CM | POA: Diagnosis not present

## 2016-10-28 DIAGNOSIS — C50412 Malignant neoplasm of upper-outer quadrant of left female breast: Secondary | ICD-10-CM | POA: Diagnosis not present

## 2016-10-28 DIAGNOSIS — Z888 Allergy status to other drugs, medicaments and biological substances status: Secondary | ICD-10-CM | POA: Insufficient documentation

## 2016-10-28 DIAGNOSIS — N809 Endometriosis, unspecified: Secondary | ICD-10-CM | POA: Insufficient documentation

## 2016-10-28 DIAGNOSIS — Z880 Allergy status to penicillin: Secondary | ICD-10-CM | POA: Diagnosis not present

## 2016-10-28 DIAGNOSIS — Z8711 Personal history of peptic ulcer disease: Secondary | ICD-10-CM | POA: Insufficient documentation

## 2016-10-28 DIAGNOSIS — G43909 Migraine, unspecified, not intractable, without status migrainosus: Secondary | ICD-10-CM | POA: Diagnosis not present

## 2016-10-28 DIAGNOSIS — Z803 Family history of malignant neoplasm of breast: Secondary | ICD-10-CM | POA: Diagnosis not present

## 2016-10-28 DIAGNOSIS — Z800 Family history of malignant neoplasm of digestive organs: Secondary | ICD-10-CM | POA: Diagnosis not present

## 2016-10-28 DIAGNOSIS — K219 Gastro-esophageal reflux disease without esophagitis: Secondary | ICD-10-CM | POA: Insufficient documentation

## 2016-10-28 DIAGNOSIS — Z87440 Personal history of urinary (tract) infections: Secondary | ICD-10-CM | POA: Insufficient documentation

## 2016-10-28 DIAGNOSIS — Z9889 Other specified postprocedural states: Secondary | ICD-10-CM | POA: Diagnosis not present

## 2016-10-28 DIAGNOSIS — Z8052 Family history of malignant neoplasm of bladder: Secondary | ICD-10-CM | POA: Insufficient documentation

## 2016-10-28 DIAGNOSIS — F329 Major depressive disorder, single episode, unspecified: Secondary | ICD-10-CM | POA: Diagnosis not present

## 2016-10-28 LAB — ECHOCARDIOGRAM COMPLETE
E decel time: 169 msec
E/e' ratio: 6.65
FS: 33 % (ref 28–44)
IVS/LV PW RATIO, ED: 0.93
LA ID, A-P, ES: 34 mm
LA diam end sys: 34 mm
LA diam index: 1.72 cm/m2
LA vol A4C: 49.3 ml
LA vol index: 24.4 mL/m2
LA vol: 48.4 mL
LV E/e' medial: 6.65
LV E/e'average: 6.65
LV PW d: 9.05 mm — AB (ref 0.6–1.1)
LV e' LATERAL: 14.5 cm/s
LVOT SV: 50 mL
LVOT VTI: 19.8 cm
LVOT area: 2.54 cm2
LVOT diameter: 18 mm
LVOT peak grad rest: 4 mmHg
LVOT peak vel: 97.9 cm/s
Lateral S' vel: 12.9 cm/s
MV Dec: 169
MV Peak grad: 4 mmHg
MV pk A vel: 81.4 m/s
MV pk E vel: 96.4 m/s
TAPSE: 22 mm
TDI e' lateral: 14.5
TDI e' medial: 9.9

## 2016-10-28 NOTE — Progress Notes (Signed)
  Echocardiogram 2D Echocardiogram has been performed.  [name] 10/28/2016, 12:21 PM

## 2016-10-28 NOTE — Patient Instructions (Signed)
Follow up in 3 months with an Echo.

## 2016-10-29 ENCOUNTER — Telehealth: Payer: Self-pay | Admitting: Gastroenterology

## 2016-10-29 NOTE — Progress Notes (Signed)
Patient ID: JEANIE MCCARD, female   DOB: 22-Feb-1976, 41 y.o.   MRN: [id number]   CARDI-ONCOLOGY CLINIC NOTE  Referring Physician: Dr Jana Hakim  [name], MD as Consulting Physician (Oncology) [name], MD as Consulting Physician (General Surgery) [name], MD as Consulting Physician (Obstetrics and Gynecology)  HPI: 41 y.o. [place] woman status post left breast upper outer quadrant biopsy 11/20/2015 for a clinical T1c N0, stage IA invasive ductal carcinoma, grade 2, estrogen receptor moderately positive, progesterone receptor negative, HER-2 positive. Also has history of anxiety and endometriosis.    Relocated from [place] to [place] to live with her Mom while she is undergoing treatment. She is getting 6 cycles of carboplatin/docetaxel/trastuzumab/pertuzumab then will get Herceptin alone to complete 1 year.   S/p mastectomy with Dr. Donne Hazel in 11/17. Lumpectomy and reconstruction   She finished her chemo on 04/15/16 andhas been receiving Herceptin q3 weeks. Will finish Herceptin in 6/18. She has completed XRT.  She reports dyspnea climbing 2 flights of steps but no dyspnea on flat ground.  More fatigued with activity. Weight is up about 17 lbs.  She says that she has been inactive.   - Echo (9/17): EF 60-65%, lateral s' 12.8 cm/sec, GLS -20.1%.  - Echo (07/19/15): EF 60-65% lateral s' 12.4 cm/sec GLS -21.2% - Echo (4/18): EF 60-65%, GLS -17.2%, normal RV size and systolic function.   FH: Grandmother - MI years ago.   Review of Systems:  All systems reviewed and negative except as per HPI.   Past Medical History:  Diagnosis Date  . Allergy   . Anxiety    has panic attacks  . Asthma    childhood, none now  . Breast cancer ([place]) 11/20/15   left breast  . Depression   . Endometriosis   . Family history of breast cancer   . GERD (gastroesophageal reflux disease)   . Herniated disc, cervical   . History of bronchitis as a child   . History of  cancer chemotherapy    left breast  . History of stomach ulcers   . History of urinary tract infection   . Hormone disorder   . IBD (inflammatory bowel disease)   . Migraine without aura   . Neck pain   . PONV (postoperative nausea and vomiting)   . Shortness of breath dyspnea    anxiety related    Current Outpatient Prescriptions  Medication Sig Dispense Refill  . diclofenac (VOLTAREN) 50 MG EC tablet Take 1 tablet by mouth 2 (two) times daily as needed.  0  . hyaluronate sodium (RADIAPLEXRX) GEL Apply 1 application topically 2 (two) times daily.    Marland Kitchen lidocaine-prilocaine (EMLA) cream Apply 1 application topically as needed. 1 hour before port access, cover with plastic wrap 30 g 1  . methocarbamol (ROBAXIN) 500 MG tablet Take 500 mg by mouth every 8 (eight) hours as needed.  1  . methylPREDNISolone (MEDROL) 4 MG TBPK tablet Taper 6,5,4,3,2,1 21 tablet 0  . non-metallic deodorant (ALRA) MISC Apply 1 application topically daily as needed.    Marland Kitchen omeprazole (PRILOSEC) 40 MG capsule Take 1 capsule (40 mg total) by mouth daily. 30 MINUTES BEFORE BREAKFAST 30 capsule 11  . sertraline (ZOLOFT) 100 MG tablet Take 1 tablet (100 mg total) by mouth daily. 30 tablet 3  . tamoxifen (NOLVADEX) 20 MG tablet Take 1 tablet (20 mg total) by mouth daily. 30 tablet 5  . traMADol (ULTRAM) 50 MG tablet Take 1 tablet (50 mg total)  by mouth every 6 (six) hours as needed. 60 tablet 0  . zolpidem (AMBIEN) 10 MG tablet Take 1/2 to 1 tab po qhs prn 30 tablet 1   No current facility-administered medications for this encounter.     Allergies  Allergen Reactions  . Amoxicillin Shortness Of Breath and Itching    Has patient had a PCN reaction causing immediate rash, facial/tongue/throat swelling, SOB or lightheadedness with hypotension: Yes Has patient had a PCN reaction causing severe rash involving mucus membranes or skin necrosis: No Has patient had a PCN reaction that required hospitalization Yes Has patient  had a PCN reaction occurring within the last 10 years: No If all of the above answers are "NO", then may proceed with Cephalosporin use.   . Diflucan [Fluconazole] Nausea Only    heartburn      Social History   Social History  . Marital status: Married    Spouse name: N/A  . Number of children: 1  . Years of education: N/A   Occupational History  . Not on file.   Social History Main Topics  . Smoking status: Never Smoker  . Smokeless tobacco: Never Used  . Alcohol use 0.0 oz/week     Comment: socially   . Drug use: No  . Sexual activity: Yes    Partners: Male    Birth control/ protection: Surgical   Other Topics Concern  . Not on file   Social History Narrative  . No narrative on file      Family History  Problem Relation Age of Onset  . Sarcoidosis Mother   . Hypertension Father   . Throat cancer Maternal Grandfather     smoker and heavy drinker; dx in his late 63s-50s  . Cancer Paternal Grandmother     possible gastric vs bladder cancer  . Bladder Cancer Paternal Grandmother   . Breast cancer Paternal Aunt     dxin her 14s; dad's maternal half sister  . Breast cancer Other     PGFs mother  . Anesthesia problems Neg Hx   . Hypotension Neg Hx   . Malignant hyperthermia Neg Hx   . Pseudochol deficiency Neg Hx   . Colon cancer Neg Hx   . Stomach cancer Neg Hx   . Rectal cancer Neg Hx   . Esophageal cancer Neg Hx   . Liver cancer Neg Hx     Vitals:   10/28/16 1208  BP: 127/73  Pulse: 82  SpO2: 100%  Weight: 214 lb 8 oz (97.3 kg)    PHYSICAL EXAM: General:  Well appearing. No respiratory difficulty HEENT: normal Neck: supple. JVP 7 cm. Carotids 2+ bilat; no bruits. No lymphadenopathy or thyromegaly appreciated. Cor: PMI nondisplaced. Regular rate & rhythm. No rubs, gallops.  1/6 early SEM RUB. R upper chest scar from porta cath.  Lungs: clear to auscultation bilaterally Abdomen: soft, nontender, nondistended. No hepatosplenomegaly. No bruits or  masses. Good bowel sounds. Extremities: no cyanosis, clubbing, rash, edema Neuro: alert & oriented x 3, cranial nerves grossly intact. moves all 4 extremities w/o difficulty. Affect pleasant.   ASSESSMENT & PLAN: 1) L Breast Cancer : T1c N0, stage IA invasive ductal carcinoma, grade 2, estrogen receptor moderately positive, progesterone receptor negative, HER-2+.  She will complete Herceptin in 6/18.  I reviewed today's echo: EF is preserved but global longitudinal strain is less negative.  However, images are not as clear as prior.  She also has had weight gain and dyspnea with moderate activity though  she does not look volume overloaded on exam.  Continue Herceptin until completion unless symptoms worsen.  I will repeat an echo in 3 months after Herceptin has been completed.   Loralie Champagne MD 10/29/2016

## 2016-10-29 NOTE — Telephone Encounter (Signed)
Called patient and let her know that we don't do prior authorizations on Suprep. Coupon (pay no more than $50) put at front desk for pat to pick up. Pt informed.

## 2016-10-29 NOTE — Progress Notes (Signed)
Patient met with counseling intern for a final counseling session today at 2pm.  Patient and counselor reflected back on their work together. Patient named several parts of their counseling work that had been especially helpful, such as reflections and summarizations. Patient shared that their work together has inspired her to consider a counseling degree herself.  Counselor named the work that the patient has been doing in her personal life to see her cancer-journey as a positive experience overall. Patient was happy to name several ways that she has grown throughout this experience, including not taking things as seriously, pursuing her dreams with more focus, and caring less what others think of her. Counselor and patient discussed the patient's newly developed skill in speaking her needs/wants directly to the people in her life. Patient thanked the counselor for her work and said she had made an impact on her future.  No referrals made. No further sessions scheduled.  Rosana Fret, Counseling Mudlogger, Lorrin Jackson, Chaplain

## 2016-10-30 ENCOUNTER — Telehealth: Payer: Self-pay | Admitting: *Deleted

## 2016-10-30 ENCOUNTER — Telehealth: Payer: Self-pay | Admitting: Oncology

## 2016-10-30 NOTE — Telephone Encounter (Signed)
lvm to inform pt of r/s appt to 5/11 per LOS

## 2016-10-30 NOTE — Telephone Encounter (Signed)
Call received from patient stating that she is unable to come in for scheduled appt on 11/11/16 and would like to change her lab and infusion appts to 11/13/16 or 11/14/16.  Message sent to scheduling.

## 2016-11-03 ENCOUNTER — Ambulatory Visit (AMBULATORY_SURGERY_CENTER): Payer: BLUE CROSS/BLUE SHIELD | Admitting: Gastroenterology

## 2016-11-03 ENCOUNTER — Encounter: Payer: Self-pay | Admitting: Gastroenterology

## 2016-11-03 VITALS — BP 123/71 | HR 74 | Temp 98.0°F | Resp 13 | Ht 64.0 in | Wt 208.0 lb

## 2016-11-03 DIAGNOSIS — K2950 Unspecified chronic gastritis without bleeding: Secondary | ICD-10-CM | POA: Diagnosis not present

## 2016-11-03 DIAGNOSIS — K219 Gastro-esophageal reflux disease without esophagitis: Secondary | ICD-10-CM

## 2016-11-03 DIAGNOSIS — K589 Irritable bowel syndrome without diarrhea: Secondary | ICD-10-CM | POA: Diagnosis present

## 2016-11-03 DIAGNOSIS — K2990 Gastroduodenitis, unspecified, without bleeding: Secondary | ICD-10-CM

## 2016-11-03 DIAGNOSIS — K2970 Gastritis, unspecified, without bleeding: Secondary | ICD-10-CM

## 2016-11-03 DIAGNOSIS — K649 Unspecified hemorrhoids: Secondary | ICD-10-CM

## 2016-11-03 DIAGNOSIS — K6289 Other specified diseases of anus and rectum: Secondary | ICD-10-CM | POA: Diagnosis not present

## 2016-11-03 MED ORDER — SODIUM CHLORIDE 0.9 % IV SOLN: 500.0000 mL | INTRAVENOUS | Status: DC

## 2016-11-03 NOTE — Progress Notes (Signed)
Report given to PACU, vss 

## 2016-11-03 NOTE — Patient Instructions (Signed)
Impression/recommendations:  Endoscopy:  Gastritis (handout given)  Colonoscopy:  Hemorrhoids (handout given)  YOU HAD AN ENDOSCOPIC PROCEDURE TODAY AT [facility]:   Refer to the procedure report that was given to you for any specific questions about what was found during the examination.  If the procedure report does not answer your questions, please call your gastroenterologist to clarify.  If you requested that your care partner not be given the details of your procedure findings, then the procedure report has been included in a sealed envelope for you to review at your convenience later.  YOU SHOULD EXPECT: Some feelings of bloating in the abdomen. Passage of more gas than usual.  Walking can help get rid of the air that was put into your GI tract during the procedure and reduce the bloating. If you had a lower endoscopy (such as a colonoscopy or flexible sigmoidoscopy) you may notice spotting of blood in your stool or on the toilet paper. If you underwent a bowel prep for your procedure, you may not have a normal bowel movement for a few days.  Please Note:  You might notice some irritation and congestion in your nose or some drainage.  This is from the oxygen used during your procedure.  There is no need for concern and it should clear up in a day or so.  SYMPTOMS TO REPORT IMMEDIATELY:   Following lower endoscopy (colonoscopy or flexible sigmoidoscopy):  Excessive amounts of blood in the stool  Significant tenderness or worsening of abdominal pains  Swelling of the abdomen that is new, acute  Fever of 100F or higher   Following upper endoscopy (EGD)  Vomiting of blood or coffee ground material  New chest pain or pain under the shoulder blades  Painful or persistently difficult swallowing  New shortness of breath  Fever of 100F or higher  Black, tarry-looking stools  For urgent or emergent issues, a gastroenterologist can be reached at any hour by calling  [phone].   DIET:  We do recommend a small meal at first, but then you may proceed to your regular diet.  Drink plenty of fluids but you should avoid alcoholic beverages for 24 hours.  ACTIVITY:  You should plan to take it easy for the rest of today and you should NOT DRIVE or use heavy machinery until tomorrow (because of the sedation medicines used during the test).    FOLLOW UP: Our staff will call the number listed on your records the next business day following your procedure to check on you and address any questions or concerns that you may have regarding the information given to you following your procedure. If we do not reach you, we will leave a message.  However, if you are feeling well and you are not experiencing any problems, there is no need to return our call.  We will assume that you have returned to your regular daily activities without incident.  If any biopsies were taken you will be contacted by phone or by letter within the next 1-3 weeks.  Please call us at [phone] if you have not heard about the biopsies in 3 weeks.    SIGNATURES/CONFIDENTIALITY: You and/or your care partner have signed paperwork which will be entered into your electronic medical record.  These signatures attest to the fact that that the information above on your After Visit Summary has been reviewed and is understood.  Full responsibility of the confidentiality of this discharge information lies with you and/or  your care-partner. 

## 2016-11-03 NOTE — Op Note (Signed)
[facility] Patient Name: [name] Procedure Date: 11/03/2016 1:16 PM MRN: [id number] Endoscopist: Milus Banister , MD Age: 41 Referring MD:  Date of Birth: [birth year] Gender: Female Account #: [id number] Procedure:                Colonoscopy Indications:              Rectal pain Medicines:                Monitored Anesthesia Care Procedure:                Pre-Anesthesia Assessment:                           - Prior to the procedure, a History and Physical                            was performed, and patient medications and                            allergies were reviewed. The patient's tolerance of                            previous anesthesia was also reviewed. The risks                            and benefits of the procedure and the sedation                            options and risks were discussed with the patient.                            All questions were answered, and informed consent                            was obtained. Prior Anticoagulants: The patient has                            taken no previous anticoagulant or antiplatelet                            agents. ASA Grade Assessment: II - A patient with                            mild systemic disease. After reviewing the risks                            and benefits, the patient was deemed in                            satisfactory condition to undergo the procedure.                           After obtaining informed consent, the colonoscope  was passed under direct vision. Throughout the                            procedure, the patient's blood pressure, pulse, and                            oxygen saturations were monitored continuously. The                            Model CF-HQ190L [id number]) scope was introduced                            through the anus and advanced to the the cecum,                            identified by appendiceal orifice and ileocecal                             valve. The colonoscopy was performed without                            difficulty. The patient tolerated the procedure                            well. The quality of the bowel preparation was                            good. The ileocecal valve, appendiceal orifice, and                            rectum were photographed. Scope In: 1:19:12 PM Scope Out: 1:29:18 PM Scope Withdrawal Time: 0 hours 6 minutes 3 seconds  Total Procedure Duration: 0 hours 10 minutes 6 seconds  Findings:                 External and internal hemorrhoids were found. The                            hemorrhoids were small.                           The exam was otherwise without abnormality on                            direct and retroflexion views. Complications:            No immediate complications. Estimated blood loss:                            None. Estimated Blood Loss:     Estimated blood loss: none. Impression:               - External and internal hemorrhoids.                           - The examination was otherwise normal on direct  and retroflexion views. Recommendation:           - Patient has a contact number available for                            emergencies. The signs and symptoms of potential                            delayed complications were discussed with the                            patient. Return to normal activities tomorrow.                            Written discharge instructions were provided to the                            patient.                           - Resume previous diet.                           - Continue present medications.                           - Repeat colonoscopy at age 47 for screening                            purposes. Milus Banister, MD 11/03/2016 1:35:43 PM This report has been signed electronically.

## 2016-11-03 NOTE — Progress Notes (Signed)
Called to room to assist during endoscopic procedure.  Patient ID and intended procedure confirmed with present staff. Received instructions for my participation in the procedure from the performing physician.  

## 2016-11-03 NOTE — Op Note (Signed)
[facility] Patient Name: [name] Procedure Date: 11/03/2016 1:16 PM MRN: [id number] Endoscopist: Milus Banister , MD Age: 41 Referring MD:  Date of Birth: 12-26-1975 Gender: Female Account #: [id number] Procedure:                Upper GI endoscopy Indications:              Heartburn Medicines:                Monitored Anesthesia Care Procedure:                Pre-Anesthesia Assessment:                           - Prior to the procedure, a History and Physical                            was performed, and patient medications and                            allergies were reviewed. The patient's tolerance of                            previous anesthesia was also reviewed. The risks                            and benefits of the procedure and the sedation                            options and risks were discussed with the patient.                            All questions were answered, and informed consent                            was obtained. Prior Anticoagulants: The patient has                            taken no previous anticoagulant or antiplatelet                            agents. ASA Grade Assessment: II - A patient with                            mild systemic disease. After reviewing the risks                            and benefits, the patient was deemed in                            satisfactory condition to undergo the procedure.                           After obtaining informed consent, the endoscope was  passed under direct vision. Throughout the                            procedure, the patient's blood pressure, pulse, and                            oxygen saturations were monitored continuously. The                            Model GIF-HQ190 [id number]) scope was introduced                            through the mouth, and advanced to the second part                            of duodenum. The upper GI endoscopy was                             accomplished without difficulty. The patient                            tolerated the procedure well. Scope In: Scope Out: Findings:                 The esophagus was normal.                           Mild inflammation characterized by erythema and                            friability was found in the gastric antrum.                            Biopsies were taken with a cold forceps for                            histology.                           The examined duodenum was normal. Complications:            No immediate complications. Estimated blood loss:                            None. Estimated Blood Loss:     Estimated blood loss: none. Impression:               - Normal esophagus.                           - Gastritis. Biopsied.                           - Normal examined duodenum. Recommendation:           - Patient has a contact number available for  emergencies. The signs and symptoms of potential                            delayed complications were discussed with the                            patient. Return to normal activities tomorrow.                            Written discharge instructions were provided to the                            patient.                           - Resume previous diet.                           - Continue present medications.                           - Await pathology results. If biopsies show H.                            pylori you will be started on appropriate                            antibiotics. If not, will increase to twice daily                            PPI and change H2 blocker to bedtime dosing. Milus Banister, MD 11/03/2016 1:37:43 PM This report has been signed electronically.

## 2016-11-03 NOTE — Progress Notes (Signed)
Pt. Reports no change in her surgical or medical history since pre-visit 09/16/2016.

## 2016-11-04 ENCOUNTER — Telehealth: Payer: Self-pay

## 2016-11-04 ENCOUNTER — Telehealth: Payer: Self-pay | Admitting: *Deleted

## 2016-11-04 NOTE — Telephone Encounter (Signed)
  Follow up Call-  Call back number 11/03/2016  Post procedure Call Back phone  # [phone] or [phone]  Permission to leave phone message Yes  Some recent data might be hidden     Patient questions:  Do you have a fever, pain , or abdominal swelling? No. Pain Score  0 *  Have you tolerated food without any problems? Yes.    Have you been able to return to your normal activities? Yes.    Do you have any questions about your discharge instructions: Diet   No. Medications  No. Follow up visit  No.  Do you have questions or concerns about your Care? No.  Actions: * If pain score is 4 or above: No action needed, pain <4.  Information provided via mother and message left with her,mother stated that "she is doing fine".

## 2016-11-04 NOTE — Telephone Encounter (Signed)
Left message on answering machine. 

## 2016-11-05 ENCOUNTER — Other Ambulatory Visit: Payer: Self-pay

## 2016-11-05 MED ORDER — SERTRALINE HCL 100 MG PO TABS: 100.0000 mg | ORAL_TABLET | Freq: Every day | ORAL | 3 refills | Status: DC

## 2016-11-06 ENCOUNTER — Telehealth (HOSPITAL_COMMUNITY): Payer: Self-pay | Admitting: Vascular Surgery

## 2016-11-06 NOTE — Telephone Encounter (Signed)
Left pt message to make f/u appt w/ [facility] w/ ECHO in July

## 2016-11-11 ENCOUNTER — Other Ambulatory Visit: Payer: BLUE CROSS/BLUE SHIELD

## 2016-11-11 ENCOUNTER — Ambulatory Visit: Payer: BLUE CROSS/BLUE SHIELD

## 2016-11-14 ENCOUNTER — Telehealth: Payer: Self-pay | Admitting: Oncology

## 2016-11-14 ENCOUNTER — Ambulatory Visit: Payer: BLUE CROSS/BLUE SHIELD

## 2016-11-14 ENCOUNTER — Other Ambulatory Visit: Payer: BLUE CROSS/BLUE SHIELD

## 2016-11-14 NOTE — Telephone Encounter (Signed)
lvm to inform pt of r/s 5/11 appts to 5/16 at 230 per sch msg

## 2016-11-19 ENCOUNTER — Ambulatory Visit (HOSPITAL_BASED_OUTPATIENT_CLINIC_OR_DEPARTMENT_OTHER): Payer: BLUE CROSS/BLUE SHIELD

## 2016-11-19 ENCOUNTER — Ambulatory Visit (HOSPITAL_BASED_OUTPATIENT_CLINIC_OR_DEPARTMENT_OTHER): Payer: BLUE CROSS/BLUE SHIELD | Admitting: Oncology

## 2016-11-19 VITALS — BP 122/74 | HR 73 | Temp 98.2°F | Resp 20

## 2016-11-19 DIAGNOSIS — Z170 Estrogen receptor positive status [ER+]: Secondary | ICD-10-CM

## 2016-11-19 DIAGNOSIS — C50412 Malignant neoplasm of upper-outer quadrant of left female breast: Secondary | ICD-10-CM | POA: Diagnosis not present

## 2016-11-19 DIAGNOSIS — Z5112 Encounter for antineoplastic immunotherapy: Secondary | ICD-10-CM | POA: Diagnosis not present

## 2016-11-19 LAB — CBC WITH DIFFERENTIAL/PLATELET
BASO%: 0.5 % (ref 0.0–2.0)
Basophils Absolute: 0 10*3/uL (ref 0.0–0.1)
EOS%: 3.5 % (ref 0.0–7.0)
Eosinophils Absolute: 0.2 10*3/uL (ref 0.0–0.5)
HCT: 33 % — ABNORMAL LOW (ref 34.8–46.6)
HGB: 11.3 g/dL — ABNORMAL LOW (ref 11.6–15.9)
LYMPH%: 23.6 % (ref 14.0–49.7)
MCH: 30.3 pg (ref 25.1–34.0)
MCHC: 34.2 g/dL (ref 31.5–36.0)
MCV: 88.7 fL (ref 79.5–101.0)
MONO#: 0.8 10*3/uL (ref 0.1–0.9)
MONO%: 11.6 % (ref 0.0–14.0)
NEUT#: 4.1 10*3/uL (ref 1.5–6.5)
NEUT%: 60.8 % (ref 38.4–76.8)
Platelets: 260 10*3/uL (ref 145–400)
RBC: 3.72 10*6/uL (ref 3.70–5.45)
RDW: 13.7 % (ref 11.2–14.5)
WBC: 6.8 10*3/uL (ref 3.9–10.3)
lymph#: 1.6 10*3/uL (ref 0.9–3.3)

## 2016-11-19 LAB — COMPREHENSIVE METABOLIC PANEL
ALT: 16 U/L (ref 0–55)
AST: 17 U/L (ref 5–34)
Albumin: 3.7 g/dL (ref 3.5–5.0)
Alkaline Phosphatase: 94 U/L (ref 40–150)
Anion Gap: 6 mEq/L (ref 3–11)
BUN: 9.8 mg/dL (ref 7.0–26.0)
CO2: 26 mEq/L (ref 22–29)
Calcium: 9.4 mg/dL (ref 8.4–10.4)
Chloride: 107 mEq/L (ref 98–109)
Creatinine: 0.8 mg/dL (ref 0.6–1.1)
EGFR: >90 mL/min/{1.73_m2} (ref >90)
Glucose: 90 mg/dl (ref 70–140)
Potassium: 3.7 mEq/L (ref 3.5–5.1)
Sodium: 139 mEq/L (ref 136–145)
Total Bilirubin: 0.25 mg/dL (ref 0.20–1.20)
Total Protein: 7.4 g/dL (ref 6.4–8.3)

## 2016-11-19 MED ORDER — DIPHENHYDRAMINE HCL 25 MG PO CAPS
ORAL_CAPSULE | ORAL | Status: AC
Start: 2016-11-19 — End: 2016-11-19
  Filled 2016-11-19: qty 1

## 2016-11-19 MED ORDER — SODIUM CHLORIDE 0.9% FLUSH
10.0000 mL | INTRAVENOUS | Status: DC | PRN
  Administered 2016-11-19: 10 mL
  Filled 2016-11-19: qty 10

## 2016-11-19 MED ORDER — HEPARIN SOD (PORK) LOCK FLUSH 100 UNIT/ML IV SOLN
500.0000 [IU] | Freq: Once | INTRAVENOUS | Status: AC | PRN
  Administered 2016-11-19: 500 [IU]
  Filled 2016-11-19: qty 5

## 2016-11-19 MED ORDER — SODIUM CHLORIDE 0.9 % IV SOLN
Freq: Once | INTRAVENOUS | Status: AC
  Administered 2016-11-19: 15:00:00 via INTRAVENOUS

## 2016-11-19 MED ORDER — ACETAMINOPHEN 325 MG PO TABS
650.0000 mg | ORAL_TABLET | Freq: Once | ORAL | Status: AC
  Administered 2016-11-19: 650 mg via ORAL

## 2016-11-19 MED ORDER — ACETAMINOPHEN 325 MG PO TABS
ORAL_TABLET | ORAL | Status: AC
  Filled 2016-11-19: qty 2

## 2016-11-19 MED ORDER — SODIUM CHLORIDE 0.9 % IV SOLN: 6.0000 mg/kg | Freq: Once | INTRAVENOUS | Status: DC

## 2016-11-19 MED ORDER — SODIUM CHLORIDE 0.9 % IV SOLN
600.0000 mg | Freq: Once | INTRAVENOUS | Status: AC
  Administered 2016-11-19: 600 mg via INTRAVENOUS
  Filled 2016-11-19: qty 28.57

## 2016-11-19 MED ORDER — DIPHENHYDRAMINE HCL 25 MG PO CAPS
25.0000 mg | ORAL_CAPSULE | Freq: Once | ORAL | Status: AC
  Administered 2016-11-19: 25 mg via ORAL

## 2016-11-19 NOTE — Patient Instructions (Signed)
[facility] Discharge Instructions for Patients Receiving Chemotherapy  Today you received the following chemotherapy agents: Herceptin   To help prevent nausea and vomiting after your treatment, we encourage you to take your nausea medication as directed.    If you develop nausea and vomiting that is not controlled by your nausea medication, call the clinic.   BELOW ARE SYMPTOMS THAT SHOULD BE REPORTED IMMEDIATELY:  *FEVER GREATER THAN 100.5 F  *CHILLS WITH OR WITHOUT FEVER  NAUSEA AND VOMITING THAT IS NOT CONTROLLED WITH YOUR NAUSEA MEDICATION  *UNUSUAL SHORTNESS OF BREATH  *UNUSUAL BRUISING OR BLEEDING  TENDERNESS IN MOUTH AND THROAT WITH OR WITHOUT PRESENCE OF ULCERS  *URINARY PROBLEMS  *BOWEL PROBLEMS  UNUSUAL RASH Items with * indicate a potential emergency and should be followed up as soon as possible.  Feel free to call the clinic you have any questions or concerns. The clinic phone number is [phone].  Please show the CHEMO ALERT CARD at check-in to the Emergency Department and triage nurse.   

## 2016-11-19 NOTE — Patient Instructions (Signed)
[facility] Discharge Instructions for Patients Receiving Chemotherapy  Today you received the following chemotherapy agents: Herceptin   To help prevent nausea and vomiting after your treatment, we encourage you to take your nausea medication as directed.    If you develop nausea and vomiting that is not controlled by your nausea medication, call the clinic.   BELOW ARE SYMPTOMS THAT SHOULD BE REPORTED IMMEDIATELY:  *FEVER GREATER THAN 100.5 F  *CHILLS WITH OR WITHOUT FEVER  NAUSEA AND VOMITING THAT IS NOT CONTROLLED WITH YOUR NAUSEA MEDICATION  *UNUSUAL SHORTNESS OF BREATH  *UNUSUAL BRUISING OR BLEEDING  TENDERNESS IN MOUTH AND THROAT WITH OR WITHOUT PRESENCE OF ULCERS  *URINARY PROBLEMS  *BOWEL PROBLEMS  UNUSUAL RASH Items with * indicate a potential emergency and should be followed up as soon as possible.  Feel free to call the clinic you have any questions or concerns. The clinic phone number is [phone].  Please show the CHEMO ALERT CARD at check-in to the Emergency Department and triage nurse.   

## 2016-11-22 ENCOUNTER — Encounter: Payer: Self-pay | Admitting: Oncology

## 2016-11-24 ENCOUNTER — Telehealth (HOSPITAL_COMMUNITY): Payer: Self-pay | Admitting: Vascular Surgery

## 2016-11-24 NOTE — Telephone Encounter (Signed)
Left pt message to make f/u brst w/ echo

## 2016-12-02 ENCOUNTER — Ambulatory Visit: Payer: BLUE CROSS/BLUE SHIELD

## 2016-12-02 ENCOUNTER — Telehealth: Payer: Self-pay | Admitting: Oncology

## 2016-12-02 ENCOUNTER — Other Ambulatory Visit: Payer: BLUE CROSS/BLUE SHIELD

## 2016-12-02 NOTE — Telephone Encounter (Signed)
Patient was here for her appointment she was late due to a power outage and backed up traffic. She could not wait to be seen because she had another appointment at another location and would be late.  She wanted to reschedule for tomorrow if possible.

## 2016-12-02 NOTE — Telephone Encounter (Signed)
LOS sent to scheduling marked as URGENT

## 2016-12-03 ENCOUNTER — Other Ambulatory Visit: Payer: Self-pay | Admitting: *Deleted

## 2016-12-03 ENCOUNTER — Telehealth: Payer: Self-pay | Admitting: *Deleted

## 2016-12-03 DIAGNOSIS — M542 Cervicalgia: Secondary | ICD-10-CM

## 2016-12-03 MED ORDER — TRAMADOL HCL 50 MG PO TABS: 50.0000 mg | ORAL_TABLET | Freq: Four times a day (QID) | ORAL | 0 refills | Status: DC | PRN

## 2016-12-05 ENCOUNTER — Other Ambulatory Visit: Payer: BLUE CROSS/BLUE SHIELD

## 2016-12-05 ENCOUNTER — Ambulatory Visit: Payer: BLUE CROSS/BLUE SHIELD

## 2016-12-09 ENCOUNTER — Ambulatory Visit: Payer: BLUE CROSS/BLUE SHIELD

## 2016-12-09 ENCOUNTER — Other Ambulatory Visit (HOSPITAL_BASED_OUTPATIENT_CLINIC_OR_DEPARTMENT_OTHER): Payer: BLUE CROSS/BLUE SHIELD

## 2016-12-09 DIAGNOSIS — C50412 Malignant neoplasm of upper-outer quadrant of left female breast: Secondary | ICD-10-CM

## 2016-12-09 DIAGNOSIS — Z170 Estrogen receptor positive status [ER+]: Secondary | ICD-10-CM

## 2016-12-09 LAB — CBC WITH DIFFERENTIAL/PLATELET
BASO%: 0.3 % (ref 0.0–2.0)
Basophils Absolute: 0 10*3/uL (ref 0.0–0.1)
EOS%: 3.7 % (ref 0.0–7.0)
Eosinophils Absolute: 0.3 10*3/uL (ref 0.0–0.5)
HCT: 32.9 % — ABNORMAL LOW (ref 34.8–46.6)
HGB: 11.2 g/dL — ABNORMAL LOW (ref 11.6–15.9)
LYMPH%: 20.1 % (ref 14.0–49.7)
MCH: 30 pg (ref 25.1–34.0)
MCHC: 33.9 g/dL (ref 31.5–36.0)
MCV: 88.6 fL (ref 79.5–101.0)
MONO#: 0.6 10*3/uL (ref 0.1–0.9)
MONO%: 7.8 % (ref 0.0–14.0)
NEUT#: 5.6 10*3/uL (ref 1.5–6.5)
NEUT%: 68.1 % (ref 38.4–76.8)
Platelets: 258 10*3/uL (ref 145–400)
RBC: 3.72 10*6/uL (ref 3.70–5.45)
RDW: 13.5 % (ref 11.2–14.5)
WBC: 8.3 10*3/uL (ref 3.9–10.3)
lymph#: 1.7 10*3/uL (ref 0.9–3.3)

## 2016-12-09 LAB — COMPREHENSIVE METABOLIC PANEL
ALT: 10 U/L (ref 0–55)
AST: 13 U/L (ref 5–34)
Albumin: 3.7 g/dL (ref 3.5–5.0)
Alkaline Phosphatase: 93 U/L (ref 40–150)
Anion Gap: 10 mEq/L (ref 3–11)
BUN: 12.7 mg/dL (ref 7.0–26.0)
CO2: 25 mEq/L (ref 22–29)
Calcium: 9.2 mg/dL (ref 8.4–10.4)
Chloride: 106 mEq/L (ref 98–109)
Creatinine: 0.9 mg/dL (ref 0.6–1.1)
EGFR: >90 mL/min/{1.73_m2} (ref >90)
Glucose: 101 mg/dl (ref 70–140)
Potassium: 3.7 mEq/L (ref 3.5–5.1)
Sodium: 141 mEq/L (ref 136–145)
Total Bilirubin: 0.22 mg/dL (ref 0.20–1.20)
Total Protein: 7.5 g/dL (ref 6.4–8.3)

## 2016-12-09 NOTE — Progress Notes (Signed)
Multiple attempts made by other RN's to access PAC without success. Patient agreed to IV attempt. Attempt x 2 unsuccessful. Patient asking to return next week for Herceptin. Val, RN will message scheduling to arrange.

## 2016-12-09 NOTE — Patient Instructions (Signed)
[facility] Discharge Instructions for Patients Receiving Chemotherapy  Today you received the following chemotherapy agents: trastuzumab (Herceptin)  To help prevent nausea and vomiting after your treatment, we encourage you to take your nausea medication as directed.  If you develop nausea and vomiting that is not controlled by your nausea medication, call the clinic.   BELOW ARE SYMPTOMS THAT SHOULD BE REPORTED IMMEDIATELY:  *FEVER GREATER THAN 100.5 F  *CHILLS WITH OR WITHOUT FEVER  NAUSEA AND VOMITING THAT IS NOT CONTROLLED WITH YOUR NAUSEA MEDICATION  *UNUSUAL SHORTNESS OF BREATH  *UNUSUAL BRUISING OR BLEEDING  TENDERNESS IN MOUTH AND THROAT WITH OR WITHOUT PRESENCE OF ULCERS  *URINARY PROBLEMS  *BOWEL PROBLEMS  UNUSUAL RASH Items with * indicate a potential emergency and should be followed up as soon as possible.  Feel free to call the clinic you have any questions or concerns. The clinic phone number is [phone].  Please show the CHEMO ALERT CARD at check-in to the Emergency Department and triage nurse.   

## 2016-12-10 ENCOUNTER — Telehealth: Payer: Self-pay | Admitting: Oncology

## 2016-12-10 NOTE — Telephone Encounter (Signed)
Spoke with patient re appointment for 6/12.

## 2016-12-16 ENCOUNTER — Ambulatory Visit (HOSPITAL_BASED_OUTPATIENT_CLINIC_OR_DEPARTMENT_OTHER): Payer: BLUE CROSS/BLUE SHIELD

## 2016-12-16 VITALS — BP 119/79 | HR 79 | Temp 98.2°F | Resp 16

## 2016-12-16 DIAGNOSIS — Z5112 Encounter for antineoplastic immunotherapy: Secondary | ICD-10-CM

## 2016-12-16 DIAGNOSIS — C50412 Malignant neoplasm of upper-outer quadrant of left female breast: Secondary | ICD-10-CM

## 2016-12-16 MED ORDER — SODIUM CHLORIDE 0.9% FLUSH
10.0000 mL | INTRAVENOUS | Status: DC | PRN
  Administered 2016-12-16: 10 mL
  Filled 2016-12-16: qty 10

## 2016-12-16 MED ORDER — SODIUM CHLORIDE 0.9 % IV SOLN
Freq: Once | INTRAVENOUS | Status: AC
  Administered 2016-12-16: 16:00:00 via INTRAVENOUS

## 2016-12-16 MED ORDER — ACETAMINOPHEN 325 MG PO TABS
650.0000 mg | ORAL_TABLET | Freq: Once | ORAL | Status: AC
  Administered 2016-12-16: 650 mg via ORAL

## 2016-12-16 MED ORDER — TRASTUZUMAB CHEMO 150 MG IV SOLR
600.0000 mg | Freq: Once | INTRAVENOUS | Status: AC
  Administered 2016-12-16: 600 mg via INTRAVENOUS
  Filled 2016-12-16: qty 28.57

## 2016-12-16 MED ORDER — DIPHENHYDRAMINE HCL 25 MG PO CAPS
25.0000 mg | ORAL_CAPSULE | Freq: Once | ORAL | Status: AC
  Administered 2016-12-16: 25 mg via ORAL

## 2016-12-16 MED ORDER — HEPARIN SOD (PORK) LOCK FLUSH 100 UNIT/ML IV SOLN
500.0000 [IU] | Freq: Once | INTRAVENOUS | Status: AC | PRN
  Administered 2016-12-16: 500 [IU]
  Filled 2016-12-16: qty 5

## 2016-12-16 MED ORDER — ACETAMINOPHEN 325 MG PO TABS
ORAL_TABLET | ORAL | Status: AC
  Filled 2016-12-16: qty 2

## 2016-12-16 MED ORDER — DIPHENHYDRAMINE HCL 25 MG PO CAPS
ORAL_CAPSULE | ORAL | Status: AC
  Filled 2016-12-16: qty 1

## 2016-12-16 NOTE — Patient Instructions (Signed)
[facility] Discharge Instructions for Patients Receiving Chemotherapy  Today you received the following chemotherapy agents Herceptin  To help prevent nausea and vomiting after your treatment, we encourage you to take your nausea medication    If you develop nausea and vomiting that is not controlled by your nausea medication, call the clinic.   BELOW ARE SYMPTOMS THAT SHOULD BE REPORTED IMMEDIATELY:  *FEVER GREATER THAN 100.5 F  *CHILLS WITH OR WITHOUT FEVER  NAUSEA AND VOMITING THAT IS NOT CONTROLLED WITH YOUR NAUSEA MEDICATION  *UNUSUAL SHORTNESS OF BREATH  *UNUSUAL BRUISING OR BLEEDING  TENDERNESS IN MOUTH AND THROAT WITH OR WITHOUT PRESENCE OF ULCERS  *URINARY PROBLEMS  *BOWEL PROBLEMS  UNUSUAL RASH Items with * indicate a potential emergency and should be followed up as soon as possible.  Feel free to call the clinic you have any questions or concerns. The clinic phone number is [phone].  Please show the CHEMO ALERT CARD at check-in to the Emergency Department and triage nurse.   

## 2016-12-17 ENCOUNTER — Telehealth: Payer: Self-pay

## 2016-12-17 NOTE — Telephone Encounter (Signed)
LVM with pt explaining that her appt's for 12/23/16 will be rescheduled to 01/06/18 d/t her getting Trastuzumab on 12/16/16 she will not be due again until 07/03.  MSG sent to schedulers to call pt with new appt times for 01/06/17

## 2016-12-19 ENCOUNTER — Telehealth: Payer: Self-pay | Admitting: Oncology

## 2016-12-19 NOTE — Telephone Encounter (Signed)
sw pt to confirm r/s 6/19 appt to 7/3 per sch msg. Pt to return call to confirm date/time

## 2016-12-23 ENCOUNTER — Ambulatory Visit: Payer: BLUE CROSS/BLUE SHIELD

## 2016-12-23 ENCOUNTER — Ambulatory Visit: Payer: BLUE CROSS/BLUE SHIELD | Admitting: Oncology

## 2016-12-23 ENCOUNTER — Other Ambulatory Visit: Payer: BLUE CROSS/BLUE SHIELD

## 2017-01-01 ENCOUNTER — Other Ambulatory Visit: Payer: Self-pay | Admitting: *Deleted

## 2017-01-01 DIAGNOSIS — Z171 Estrogen receptor negative status [ER-]: Secondary | ICD-10-CM

## 2017-01-01 DIAGNOSIS — C50219 Malignant neoplasm of upper-inner quadrant of unspecified female breast: Secondary | ICD-10-CM

## 2017-01-01 MED ORDER — ZOLPIDEM TARTRATE 10 MG PO TABS: ORAL_TABLET | ORAL | 1 refills | Status: DC

## 2017-01-06 ENCOUNTER — Ambulatory Visit: Payer: BLUE CROSS/BLUE SHIELD | Admitting: Adult Health

## 2017-01-06 ENCOUNTER — Ambulatory Visit: Payer: BLUE CROSS/BLUE SHIELD

## 2017-01-06 ENCOUNTER — Other Ambulatory Visit: Payer: BLUE CROSS/BLUE SHIELD

## 2017-01-09 ENCOUNTER — Ambulatory Visit (HOSPITAL_BASED_OUTPATIENT_CLINIC_OR_DEPARTMENT_OTHER): Payer: BLUE CROSS/BLUE SHIELD | Admitting: Oncology

## 2017-01-09 ENCOUNTER — Ambulatory Visit (HOSPITAL_BASED_OUTPATIENT_CLINIC_OR_DEPARTMENT_OTHER): Payer: BLUE CROSS/BLUE SHIELD

## 2017-01-09 ENCOUNTER — Other Ambulatory Visit (HOSPITAL_BASED_OUTPATIENT_CLINIC_OR_DEPARTMENT_OTHER): Payer: BLUE CROSS/BLUE SHIELD

## 2017-01-09 ENCOUNTER — Encounter: Payer: Self-pay | Admitting: *Deleted

## 2017-01-09 VITALS — BP 131/79 | HR 73 | Temp 98.3°F | Resp 18 | Ht 64.0 in | Wt 216.2 lb

## 2017-01-09 DIAGNOSIS — Z171 Estrogen receptor negative status [ER-]: Secondary | ICD-10-CM

## 2017-01-09 DIAGNOSIS — Z5112 Encounter for antineoplastic immunotherapy: Secondary | ICD-10-CM | POA: Diagnosis not present

## 2017-01-09 DIAGNOSIS — C50412 Malignant neoplasm of upper-outer quadrant of left female breast: Secondary | ICD-10-CM

## 2017-01-09 DIAGNOSIS — Z170 Estrogen receptor positive status [ER+]: Secondary | ICD-10-CM

## 2017-01-09 DIAGNOSIS — M542 Cervicalgia: Secondary | ICD-10-CM

## 2017-01-09 DIAGNOSIS — C50219 Malignant neoplasm of upper-inner quadrant of unspecified female breast: Secondary | ICD-10-CM

## 2017-01-09 DIAGNOSIS — Z452 Encounter for adjustment and management of vascular access device: Secondary | ICD-10-CM | POA: Diagnosis not present

## 2017-01-09 LAB — CBC WITH DIFFERENTIAL/PLATELET
BASO%: 0.3 % (ref 0.0–2.0)
Basophils Absolute: 0 10*3/uL (ref 0.0–0.1)
EOS%: 3.6 % (ref 0.0–7.0)
Eosinophils Absolute: 0.3 10*3/uL (ref 0.0–0.5)
HCT: 34.2 % — ABNORMAL LOW (ref 34.8–46.6)
HGB: 11.5 g/dL — ABNORMAL LOW (ref 11.6–15.9)
LYMPH%: 19 % (ref 14.0–49.7)
MCH: 29.8 pg (ref 25.1–34.0)
MCHC: 33.8 g/dL (ref 31.5–36.0)
MCV: 88.2 fL (ref 79.5–101.0)
MONO#: 0.7 10*3/uL (ref 0.1–0.9)
MONO%: 7.9 % (ref 0.0–14.0)
NEUT#: 5.8 10*3/uL (ref 1.5–6.5)
NEUT%: 69.2 % (ref 38.4–76.8)
Platelets: 265 10*3/uL (ref 145–400)
RBC: 3.87 10*6/uL (ref 3.70–5.45)
RDW: 12.9 % (ref 11.2–14.5)
WBC: 8.3 10*3/uL (ref 3.9–10.3)
lymph#: 1.6 10*3/uL (ref 0.9–3.3)

## 2017-01-09 LAB — COMPREHENSIVE METABOLIC PANEL
ALT: 11 U/L (ref 0–55)
AST: 16 U/L (ref 5–34)
Albumin: 3.6 g/dL (ref 3.5–5.0)
Alkaline Phosphatase: 85 U/L (ref 40–150)
Anion Gap: 6 mEq/L (ref 3–11)
BUN: 10.3 mg/dL (ref 7.0–26.0)
CO2: 24 mEq/L (ref 22–29)
Calcium: 9.1 mg/dL (ref 8.4–10.4)
Chloride: 108 mEq/L (ref 98–109)
Creatinine: 0.8 mg/dL (ref 0.6–1.1)
EGFR: >90 mL/min/{1.73_m2} (ref >90)
Glucose: 90 mg/dl (ref 70–140)
Potassium: 4.1 mEq/L (ref 3.5–5.1)
Sodium: 138 mEq/L (ref 136–145)
Total Bilirubin: 0.25 mg/dL (ref 0.20–1.20)
Total Protein: 7.4 g/dL (ref 6.4–8.3)

## 2017-01-09 MED ORDER — SODIUM CHLORIDE 0.9 % IV SOLN
Freq: Once | INTRAVENOUS | Status: AC
  Administered 2017-01-09: 16:00:00 via INTRAVENOUS

## 2017-01-09 MED ORDER — ALTEPLASE 2 MG IJ SOLR
2.0000 mg | Freq: Once | INTRAMUSCULAR | Status: AC | PRN
  Administered 2017-01-09: 2 mg
  Filled 2017-01-09: qty 2

## 2017-01-09 MED ORDER — ACETAMINOPHEN 325 MG PO TABS
650.0000 mg | ORAL_TABLET | Freq: Once | ORAL | Status: AC
Start: 2017-01-09 — End: 2017-01-09
  Administered 2017-01-09: 650 mg via ORAL

## 2017-01-09 MED ORDER — ACETAMINOPHEN 325 MG PO TABS
ORAL_TABLET | ORAL | Status: AC
  Filled 2017-01-09: qty 2

## 2017-01-09 MED ORDER — TRAMADOL HCL 50 MG PO TABS: 50.0000 mg | ORAL_TABLET | Freq: Four times a day (QID) | ORAL | 0 refills | Status: DC | PRN

## 2017-01-09 MED ORDER — SODIUM CHLORIDE 0.9% FLUSH
10.0000 mL | INTRAVENOUS | Status: DC | PRN
  Administered 2017-01-09: 10 mL
  Filled 2017-01-09: qty 10

## 2017-01-09 MED ORDER — DIPHENHYDRAMINE HCL 25 MG PO CAPS
25.0000 mg | ORAL_CAPSULE | Freq: Once | ORAL | Status: AC
  Administered 2017-01-09: 25 mg via ORAL

## 2017-01-09 MED ORDER — DIPHENHYDRAMINE HCL 25 MG PO CAPS
ORAL_CAPSULE | ORAL | Status: AC
  Filled 2017-01-09: qty 1

## 2017-01-09 MED ORDER — ZOLPIDEM TARTRATE 10 MG PO TABS: ORAL_TABLET | ORAL | 1 refills | Status: DC

## 2017-01-09 MED ORDER — TRASTUZUMAB CHEMO 150 MG IV SOLR
600.0000 mg | Freq: Once | INTRAVENOUS | Status: AC
  Administered 2017-01-09: 600 mg via INTRAVENOUS
  Filled 2017-01-09: qty 28.57

## 2017-01-09 MED ORDER — LIDOCAINE-PRILOCAINE 2.5-2.5 % EX CREA
TOPICAL_CREAM | CUTANEOUS | Status: AC
  Filled 2017-01-09: qty 5

## 2017-01-09 MED ORDER — HEPARIN SOD (PORK) LOCK FLUSH 100 UNIT/ML IV SOLN
500.0000 [IU] | Freq: Once | INTRAVENOUS | Status: AC | PRN
  Administered 2017-01-09: 500 [IU]
  Filled 2017-01-09: qty 5

## 2017-01-09 NOTE — Progress Notes (Signed)
[place]  [phone] [phone]     ID: [name] DOB: 09/01/1975  MR#: [id number]  [facility]#:[id number]  Patient Care Team: [name], MD as PCP - General (Oncology) Magrinat, Virgie Dad, MD as Consulting Physician (Oncology) [name], MD as Consulting Physician (General Surgery) [name], MD as Consulting Physician (Obstetrics and Gynecology) PCP: Jana Hakim Virgie Dad, MD OTHER MD:  CHIEF COMPLAINT: HER-2 positive, Estrogen receptor moderately positive breast cancer  CURRENT TREATMENT: Completing a year of anti-HER-2 immunotherapy.   BREAST CANCER HISTORY: From the original intake note:  [name] woke up the morning of 11/09/2015 with some pain in her left axilla. She examined herself and found a lump in her left breast. She saw her gynecologist the same day, and she confirms a lump. The patient then proceeded directly to mammography. I do not have that report. However it did show the mass. [name] was then scheduled for ultrasound-guided biopsy 11/21/2015. This showed ([place] [phone] at the [employer]) and invasive ductal carcinoma, grade 2 estrogen receptor positive, with moderate intensity (10-50%), progesterone receptor negative, and HER-2 equivocal by immunohistochemistry. Fish was obtained and was equivocal as well, with a signals ratio of 1.8 to, the number per cell being 5.7.  On 12/05/2015 the patient had a CT/ angiogram of the chest for evaluation of her left-sided chest pain. This showed no evidence of a clot. The left breast nodule measured 1.3 cm on the study. There was no enlarged axillary adenopathy. There was also no evidence of lung metastasis or blastic or destructive lytic bone lesions. The upper abdomen was unremarkable.  With that information the patient presents for further evaluation and treatment.  INTERVAL HISTORY: [name] returns today for follow-up and treatment  of her HER-2/neu positive breast cancer. Today is her last trastuzumab dose. She completes a year. She has tolerated this remarkably well. Her last echocardiogram was late April 2018 and showed a well-preserved ejection fraction.  She is also on tamoxifen, for her moderately positive estrogen receptor portion of her cancer. She generally tolerates this well although as with many young patients she finds it difficult to separate some of the symptoms she is experiencing from possible side effects of the medication.  REVIEW OF SYSTEMS: [name] continues to have multiple symptoms related to her earlier treatment. She still has some peripheral neuropathy involving the feet in particular and sometimes she feels like her steps are on even. She is severely fatigued. She is exercising by walking and recently got a past to the gym through her sister so she is doing some stationary biking. However every joint aches and she has to force herself to do anything physical. He describes the pains as throbbing and constant and making it difficult to walk. She complains of palpitations poor circulation shortness of breath, dry cough, easy bruising, headaches, weakness, forgetfulness and depression. She is having hot flashes. Recall she is status post hysterectomy but not salpingo-oophorectomy. She is taking turmeric which is not helping her symptoms. She says her appetite is down but she continues to gain weight. A detailed review of systems today was otherwise stable  PAST MEDICAL HISTORY: Past Medical History:  Diagnosis Date  . Allergy   . Anxiety    has panic attacks  . Asthma    childhood, none now  . Breast cancer ([place]) 11/20/15   left breast  . Depression   . Endometriosis   . Family history of breast cancer   .  GERD (gastroesophageal reflux disease)   . Herniated disc, cervical   . History of bronchitis as a child   . History of cancer chemotherapy    left breast  . History of stomach ulcers   . History  of urinary tract infection   . Hormone disorder   . IBD (inflammatory bowel disease)   . Migraine without aura   . Neck pain   . PONV (postoperative nausea and vomiting)   . Shortness of breath dyspnea    anxiety related    PAST SURGICAL HISTORY: Past Surgical History:  Procedure Laterality Date  . ABDOMINAL HYSTERECTOMY    . laparoscopically assisted vaginal hysterectomy  01/2006   Still has ovaries  . LAPAROSCOPY     x 2, diagnosed with endometriosis (prior to hysterectomy), only treated medically.  [name] Kitchen MASTOPEXY Bilateral 05/20/2016   Procedure: MASTOPEXY;  Surgeon: Irene Limbo, MD;  Location: [facility];  Service: Plastics;  Laterality: Bilateral;  . PORTACATH PLACEMENT Right 12/25/2015   Procedure: INSERTION PORT-A-CATH WITH ULTRA SOUND;  Surgeon: [name], MD;  Location: [facility];  Service: General;  Laterality: Right;  . RADIOACTIVE SEED GUIDED MASTECTOMY WITH AXILLARY SENTINEL LYMPH NODE BIOPSY Left 05/12/2016   Procedure: BREAST LUMPECTOMY WITH RADIOACTIVE SEED AND SENTINEL LYMPH NODE BIOPSY AND BLUE DYE INJECTION;  Surgeon: [name], MD;  Location: [facility];  Service: General;  Laterality: Left;    FAMILY HISTORY Family History  Problem Relation Age of Onset  . Sarcoidosis Mother   . Hypertension Father   . Throat cancer Maternal Grandfather        smoker and heavy drinker; dx in his late 20s-50s  . Cancer Paternal Grandmother        possible gastric vs bladder cancer  . Bladder Cancer Paternal Grandmother   . Breast cancer Paternal Aunt        dxin her 74s; dad's maternal half sister  . Breast cancer Other        PGFs mother  . Anesthesia problems Neg Hx   . Hypotension Neg Hx   . Malignant hyperthermia Neg Hx   . Pseudochol deficiency Neg Hx   . Colon cancer Neg Hx   . Stomach cancer Neg Hx   . Rectal cancer Neg Hx   . Esophageal cancer Neg Hx   . Liver cancer Neg Hx   The patient's parents are both living,  in their early 68s. The patient has one brother and one sister. On the father's side 1 great grandmother was diagnosed with breast cancer in her 62s. The patient's father's mother was diagnosed with stomach cancer. One of the patient's father's sisters was diagnosed with breast cancer in her 83s. On the maternal side patient's mother's father was diagnosed with throat cancer at age.   GYNECOLOGIC HISTORY:  No LMP recorded. Patient has had a hysterectomy.  menarche age 35, first live birth age 47. The patient is GX P1. She stopped having periods in 2008, when she underwent a simple hysterectomy without salpingo-oophorectomy. She has a history of endometriosis and was started on Provera in March of this year, with her next dose due next week. (However she has decided to forego further Provera treatments at least for now).   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 He is licensed in [place] but not in [place].  He is now in this area looking for a job. The patient's son [name] is a Ship broker in music at [employer]: Not in place   HEALTH MAINTENANCE: Social History  Substance Use Topics  . Smoking status: Never Smoker  . Smokeless tobacco: Never Used  . Alcohol use 0.0 oz/week     Comment: socially      Colonoscopy:  PAP:  Bone density:  Lipid panel:  Allergies  Allergen Reactions  . Amoxicillin Shortness Of Breath and Itching    Has patient had a PCN reaction causing immediate rash, facial/tongue/throat swelling, SOB or lightheadedness with hypotension: Yes Has patient had a PCN reaction causing severe rash involving mucus membranes or skin necrosis: No Has patient had a PCN reaction that required hospitalization Yes Has patient had a PCN reaction occurring within the last 10 years: No If all of the  above answers are "NO", then may proceed with Cephalosporin use.   . Diflucan [Fluconazole] Nausea Only    heartburn    Current Outpatient Prescriptions  Medication Sig Dispense Refill  . methocarbamol (ROBAXIN) 500 MG tablet Take 500 mg by mouth every 8 (eight) hours as needed.  1  . omeprazole (PRILOSEC) 40 MG capsule Take 1 capsule (40 mg total) by mouth daily. 30 MINUTES BEFORE BREAKFAST 30 capsule 11  . sertraline (ZOLOFT) 100 MG tablet Take 1 tablet (100 mg total) by mouth daily. 30 tablet 3  . tamoxifen (NOLVADEX) 20 MG tablet Take 1 tablet (20 mg total) by mouth daily. 30 tablet 5  . traMADol (ULTRAM) 50 MG tablet Take 1 tablet (50 mg total) by mouth every 6 (six) hours as needed. 60 tablet 0  . zolpidem (AMBIEN) 10 MG tablet Take 1/2 to 1 tab po qhs prn 30 tablet 1   No current facility-administered medications for this visit.    Facility-Administered Medications Ordered in Other Visits  Medication Dose Route Frequency Provider Last Rate Last Dose  . sodium chloride flush (NS) 0.9 % injection 10 mL  10 mL Intracatheter PRN Magrinat, Virgie Dad, MD   10 mL at 01/09/17 1659    OBJECTIVE: Young African-American woman who appears stated age  Vitals:   01/09/17 1437  BP: 131/79  Pulse: 73  Resp: 18  Temp: 98.3 F (36.8 C)     Body mass index is 37.11 kg/m.    ECOG FS:1 - Symptomatic but completely ambulatory Filed Weights   01/09/17 1437  Weight: 216 lb 3.2 oz (98.1 kg)   Sclerae unicteric, pupils round and equal Oropharynx clear and moist No cervical or supraclavicular adenopathy Lungs no rales or rhonchi Heart regular rate and rhythm Abd soft, nontender, positive bowel sounds MSK no focal spinal tenderness, no upper extremity lymphedema Neuro: nonfocal, well oriented, appropriate affect Breasts: Status post bilateral mastopexy with an excellent cosmetic result. The left breast is also status post lumpectomy and radiation, with no evidence of local recurrence. Both  axillae are benign.   [name] Kitchen  LAB RESULTS:  CMP     Component Value Date/Time   NA 138 01/09/2017 1422   K 4.1 01/09/2017 1422   CL 107 12/04/2015 1858   CO2 24 01/09/2017 1422   GLUCOSE 90 01/09/2017 1422   BUN 10.3 01/09/2017 1422   CREATININE 0.8 01/09/2017 1422   CALCIUM 9.1 01/09/2017 1422   PROT 7.4 01/09/2017 1422   ALBUMIN 3.6 01/09/2017 1422   AST 16 01/09/2017 1422   ALT 11 01/09/2017 1422  ALKPHOS 85 01/09/2017 1422   BILITOT 0.25 01/09/2017 1422   GFRNONAA >60 12/04/2015 1858   GFRAA >60 12/04/2015 1858    INo results found for: SPEP, UPEP  Lab Results  Component Value Date   WBC 8.3 01/09/2017   NEUTROABS 5.8 01/09/2017   HGB 11.5 (L) 01/09/2017   HCT 34.2 (L) 01/09/2017   MCV 88.2 01/09/2017   PLT 265 01/09/2017      Chemistry      Component Value Date/Time   NA 138 01/09/2017 1422   K 4.1 01/09/2017 1422   CL 107 12/04/2015 1858   CO2 24 01/09/2017 1422   BUN 10.3 01/09/2017 1422   CREATININE 0.8 01/09/2017 1422      Component Value Date/Time   CALCIUM 9.1 01/09/2017 1422   ALKPHOS 85 01/09/2017 1422   AST 16 01/09/2017 1422   ALT 11 01/09/2017 1422   BILITOT 0.25 01/09/2017 1422       No results found for: LABCA2  No components found for: LABCA125  No results for input(s): INR in the last 168 hours.  Urinalysis    Component Value Date/Time   COLORURINE STRAW (A) 12/11/2008 1923   APPEARANCEUR CLEAR 12/11/2008 1923   LABSPEC 1.020 12/11/2008 1923   PHURINE 6.0 12/11/2008 1923   GLUCOSEU NEGATIVE 12/11/2008 1923   HGBUR NEGATIVE 12/11/2008 1923   BILIRUBINUR NEGATIVE 12/11/2008 1923   KETONESUR NEGATIVE 12/11/2008 1923   PROTEINUR NEGATIVE 12/11/2008 1923   UROBILINOGEN 0.2 12/11/2008 1923   NITRITE NEGATIVE 12/11/2008 1923   LEUKOCYTESUR  12/11/2008 1923    NEGATIVE MICROSCOPIC NOT DONE ON URINES WITH NEGATIVE PROTEIN, BLOOD, LEUKOCYTES, NITRITE, OR GLUCOSE <1000 mg/dL.      ELIGIBLE FOR AVAILABLE RESEARCH PROTOCOL:  no  STUDIES: No results found.  ASSESSMENT: 41 y.o. [place] woman status post left breast upper outer quadrant biopsy 11/20/2015 for a clinical T1c N0, stage IA invasive ductal carcinoma, grade 2, estrogen receptor moderately positive, progesterone receptor negative, HER-2 equivocal by both immunohistochemistry and FISH  (1) neoadjuvant tamoxifen started 12/11/2015, stopped at the start of chemotherapy  (2) genetics testing 01/03/2016 through the Breast/Ovarian gene panel offered by [facility] found no deleterious mutations in ATM, BARD1, BRCA1, BRCA2, BRIP1, CDH1, CHEK2, EPCAM, FANCC, MLH1, MSH2, MSH6, NBN, PALB2, PMS2, PTEN, RAD51C, RAD51D, TP53, and XRCC2  (3) neoadjuvant chemotherapy consisting of carboplatin, docetaxel, trastuzumab and pertuzumab every 21 days 6 starting 01/01/2016, completed 04/15/2016  (4) trastuzumab to be continued to complete a year (through 01/09/2017)  (a) echocardiogram 10/28/2016 shows an ejection fraction of 60-65%.   (5) left lumpectomy and sentinel lymph node sampling 05/12/2016 showed a complete pathologic response (ypT0, ypN0)  (a) Status post bilateral mastopexy 05/20/2016  (6) adjuvant radiation from 07/28/2016 to 09/11/2016, left breast 50.4 Gy in 28 fractions, left breast boost 10 Gy in 5 fractions  (7) tamoxifen resumed 09/30/2016  PLAN: Jessicaann completes her year of trastuzumab today. This is a real milestone for her. She essentially completes all treatment since her tumor was estrogen receptor negative.  I find at the end of treatment in breast cancer is in a special atraumatic time. 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 There is a incidence of posttraumatic stress, depression, and emotional turmoil. In young women this is exacerbated by concerns regarding menopause, weight gain disfigurement from surgery, and other image problems. Essentially all relationships are disturbed and need to be reworked to a "near normal".    Luckily in Verlie's case her marriage has  held. She has multiple residual symptoms from all her treatments but all these physical memories will fade over the next year or so and she can count on being 80-85% "back" in about a year.  The process of receiving the personality to absorb the trauma she has experienced I find frequently takes up to 3 years. After that time however many of my patients are very satisfied with her "near normal", and likely new "me" better than the old one, who frequently seems callow and lacking empathy in retrospect  I discussed all this at length today with Paulita because she is not living in [place] anymore, coming here is difficult, and she and her husband will be moving to [place] as soon as he can find a job there. Ideally she would participate in our "finding urinary normal" group but that will not be possible. I'm hoping today's discussion was helpful to her.  She is now ready to have her port removed. She will see me sometime in October if she is still in [place]. She knows to call for any problems that may develop before her next visit here.      [name], MD   01/09/2017 7:38 PM Medical Oncology and Hematology [facility] [address] [place], [place] [address] Tel. [phone]    Fax. [phone]

## 2017-01-09 NOTE — Patient Instructions (Signed)
[facility] Discharge Instructions for Patients Receiving Chemotherapy  Today you received the following chemotherapy agents: Herceptin   To help prevent nausea and vomiting after your treatment, we encourage you to take your nausea medication as directed.    If you develop nausea and vomiting that is not controlled by your nausea medication, call the clinic.   BELOW ARE SYMPTOMS THAT SHOULD BE REPORTED IMMEDIATELY:  *FEVER GREATER THAN 100.5 F  *CHILLS WITH OR WITHOUT FEVER  NAUSEA AND VOMITING THAT IS NOT CONTROLLED WITH YOUR NAUSEA MEDICATION  *UNUSUAL SHORTNESS OF BREATH  *UNUSUAL BRUISING OR BLEEDING  TENDERNESS IN MOUTH AND THROAT WITH OR WITHOUT PRESENCE OF ULCERS  *URINARY PROBLEMS  *BOWEL PROBLEMS  UNUSUAL RASH Items with * indicate a potential emergency and should be followed up as soon as possible.  Feel free to call the clinic you have any questions or concerns. The clinic phone number is [phone].  Please show the CHEMO ALERT CARD at check-in to the Emergency Department and triage nurse.   

## 2017-01-28 ENCOUNTER — Ambulatory Visit (INDEPENDENT_AMBULATORY_CARE_PROVIDER_SITE_OTHER): Payer: BLUE CROSS/BLUE SHIELD | Admitting: Family Medicine

## 2017-01-28 ENCOUNTER — Encounter: Payer: Self-pay | Admitting: Family Medicine

## 2017-01-28 VITALS — BP 118/78 | HR 89 | Temp 98.2°F | Ht 64.0 in | Wt 216.6 lb

## 2017-01-28 DIAGNOSIS — R5383 Other fatigue: Secondary | ICD-10-CM | POA: Diagnosis not present

## 2017-01-28 DIAGNOSIS — M2550 Pain in unspecified joint: Secondary | ICD-10-CM | POA: Diagnosis not present

## 2017-01-28 DIAGNOSIS — F324 Major depressive disorder, single episode, in partial remission: Secondary | ICD-10-CM

## 2017-01-28 DIAGNOSIS — F5101 Primary insomnia: Secondary | ICD-10-CM | POA: Diagnosis not present

## 2017-01-28 DIAGNOSIS — G43909 Migraine, unspecified, not intractable, without status migrainosus: Secondary | ICD-10-CM | POA: Insufficient documentation

## 2017-01-28 DIAGNOSIS — R4189 Other symptoms and signs involving cognitive functions and awareness: Secondary | ICD-10-CM | POA: Diagnosis not present

## 2017-01-28 DIAGNOSIS — Z171 Estrogen receptor negative status [ER-]: Secondary | ICD-10-CM | POA: Diagnosis not present

## 2017-01-28 DIAGNOSIS — E669 Obesity, unspecified: Secondary | ICD-10-CM

## 2017-01-28 DIAGNOSIS — F329 Major depressive disorder, single episode, unspecified: Secondary | ICD-10-CM

## 2017-01-28 DIAGNOSIS — K219 Gastro-esophageal reflux disease without esophagitis: Secondary | ICD-10-CM

## 2017-01-28 DIAGNOSIS — N809 Endometriosis, unspecified: Secondary | ICD-10-CM

## 2017-01-28 DIAGNOSIS — C50412 Malignant neoplasm of upper-outer quadrant of left female breast: Secondary | ICD-10-CM | POA: Diagnosis not present

## 2017-01-28 HISTORY — DX: Migraine, unspecified, not intractable, without status migrainosus: G43.909

## 2017-01-28 LAB — CBC WITH DIFFERENTIAL/PLATELET
Basophils Absolute: 0.1 10*3/uL (ref 0.0–0.1)
Basophils Relative: 1 % (ref 0.0–3.0)
Eosinophils Absolute: 0.3 10*3/uL (ref 0.0–0.7)
Eosinophils Relative: 3.5 % (ref 0.0–5.0)
HCT: 34.6 % — ABNORMAL LOW (ref 36.0–46.0)
Hemoglobin: 11.4 g/dL — ABNORMAL LOW (ref 12.0–15.0)
Lymphocytes Relative: 22.3 % (ref 12.0–46.0)
Lymphs Abs: 1.9 10*3/uL (ref 0.7–4.0)
MCHC: 33.1 g/dL (ref 30.0–36.0)
MCV: 88.5 fl (ref 78.0–100.0)
Monocytes Absolute: 0.8 10*3/uL (ref 0.1–1.0)
Monocytes Relative: 9.6 % (ref 3.0–12.0)
Neutro Abs: 5.3 10*3/uL (ref 1.4–7.7)
Neutrophils Relative %: 63.6 % (ref 43.0–77.0)
Platelets: 281 10*3/uL (ref 150.0–400.0)
RBC: 3.91 Mil/uL (ref 3.87–5.11)
RDW: 13.1 % (ref 11.5–15.5)
WBC: 8.3 10*3/uL (ref 4.0–10.5)

## 2017-01-28 LAB — COMPREHENSIVE METABOLIC PANEL
ALT: 15 U/L (ref 0–35)
AST: 18 U/L (ref 0–37)
Albumin: 4.1 g/dL (ref 3.5–5.2)
Alkaline Phosphatase: 81 U/L (ref 39–117)
BUN: 12 mg/dL (ref 6–23)
CO2: 27 mEq/L (ref 19–32)
Calcium: 9.5 mg/dL (ref 8.4–10.5)
Chloride: 103 mEq/L (ref 96–112)
Creatinine, Ser: 0.95 mg/dL (ref 0.40–1.20)
GFR: 82.49 mL/min (ref >60.00)
Glucose, Bld: 104 mg/dL — ABNORMAL HIGH (ref 70–99)
Potassium: 3.9 mEq/L (ref 3.5–5.1)
Sodium: 136 mEq/L (ref 135–145)
Total Bilirubin: 0.2 mg/dL (ref 0.2–1.2)
Total Protein: 7.6 g/dL (ref 6.0–8.3)

## 2017-01-28 LAB — SEDIMENTATION RATE: Sed Rate: 55 mm/hr — ABNORMAL HIGH (ref 0–20)

## 2017-01-28 LAB — C-REACTIVE PROTEIN: CRP: 0.6 mg/dL (ref 0.5–20.0)

## 2017-01-28 LAB — TSH: TSH: 3.59 u[IU]/mL (ref 0.35–4.50)

## 2017-01-28 MED ORDER — TRAMADOL HCL 50 MG PO TABS: 50.0000 mg | ORAL_TABLET | Freq: Four times a day (QID) | ORAL | 0 refills | Status: DC | PRN

## 2017-01-28 NOTE — Progress Notes (Signed)
[name] is a 41 y.o. female is here to [facility].   Patient Care Team: Briscoe Deutscher, DO as PCP - General (Family Medicine) Magrinat, Virgie Dad, MD as Consulting Physician (Oncology) [name], MD as Consulting Physician (General Surgery) [name], MD as Consulting Physician (Obstetrics and Gynecology)   History of Present Illness:   Shaune Pascal CMA acting as scribe for Dr. Juleen China.  HPI: Patient comes in today to establish care. Patient is just finishing Chemo and radiation for breast cancer. She had a partial mastectomy on the left side.   Cognitive Issues: She is having trouble remembering names of family members and sometimes will have a hard time finishing a sentence. This started while taking chemo. She had a scary situation where she could not remember what city she was in. This has happened a couple times. Will place referral for Neurology and get labs today.  Pain: Left shoulder and arm pain. Pain is also in the back, knees, feet, and fingers. It is a deep ache. Pain is basically in the joints.  Some swelling in the knee, feet, and ankle. None today. Will get some labs today.   Fatigue: They have told her that the fatigue is from the radiation. She is not able to work out anymore due to the fatigue and pain.  Talked about a [facility] referral today.   Depression: She started Zoloft in June or July of 2017 for anxiety. History on depression and suicide attempt when she was in her twenties.She has taken pills and had to have a stomach pumped. She has also tried to drive her car off of the road before.  She has been seeing a counselor for this from May 2017 to May 2018 on a weekly basis.  Has seen behavorial health. Has never been told that she has bipolar or other diseases other than depression. Stable on Zoloft and Ambien.   Health Maintenance Due  Topic Date Due  . HIV Screening  11/18/1988  . TETANUS/TDAP  11/18/1992   PMHx, SurgHx, SocialHx,  Medications, and Allergies were reviewed in the Visit Navigator and updated as appropriate.   Past Medical History:  Diagnosis Date  . Breast cancer ([place]) of left breast 11/20/2015  . Depression   . Endometriosis   . GAD (generalized anxiety disorder)   . GERD (gastroesophageal reflux disease)   . Herniated disc, cervical   . History of cancer chemotherapy   . History of stomach ulcers   . Hormone disorder   . IBD (inflammatory bowel disease)   . Migraine 01/28/2017  . Migraine without aura   . Panic attacks   . PONV (postoperative nausea and vomiting)   . Seasonal allergies    Past Surgical History:  Procedure Laterality Date  . ABDOMINAL HYSTERECTOMY    . LAPAROSCOPIC ASSISTED VAGINAL HYSTERECTOMY     Spared ovaries  . LAPAROSCOPY     x 2, diagnosed with endometriosis (prior to hysterectomy), only treated medically.  Marland Kitchen MASTOPEXY Bilateral 05/20/2016   Procedure: MASTOPEXY;  Surgeon: Irene Limbo, MD;  Location: [place];  Service: Plastics;  Laterality: Bilateral;  . PORTACATH PLACEMENT Right 12/25/2015   Procedure: INSERTION PORT-A-CATH WITH ULTRA SOUND;  Surgeon: [name], MD;  Location[facility];  Service: General;  Laterality: Right;  . RADIOACTIVE SEED GUIDED MASTECTOMY WITH AXILLARY SENTINEL LYMPH NODE BIOPSY Left 05/12/2016   Procedure: BREAST LUMPECTOMY WITH RADIOACTIVE SEED AND SENTINEL LYMPH NODE BIOPSY AND BLUE DYE INJECTION;  Surgeon: Rodman Key  Donne Hazel, MD;  Location: [place];  Service: General;  Laterality: Left;   Family History  Problem Relation Age of Onset  . Sarcoidosis Mother   . Hypertension Father   . Throat cancer Maternal Grandfather        smoker and heavy drinker; dx in his late 27s-50s  . Cancer Paternal Grandmother        possible gastric vs bladder cancer  . Bladder Cancer Paternal Grandmother   . Breast cancer Paternal Aunt        dxin her 66s; dad's maternal half sister  . Breast cancer Other         PGFs mother  . Anesthesia problems Neg Hx   . Hypotension Neg Hx   . Malignant hyperthermia Neg Hx   . Pseudochol deficiency Neg Hx   . Colon cancer Neg Hx   . Stomach cancer Neg Hx   . Rectal cancer Neg Hx   . Esophageal cancer Neg Hx   . Liver cancer Neg Hx    Social History  Substance Use Topics  . Smoking status: Never Smoker  . Smokeless tobacco: Never Used  . Alcohol use 0.0 oz/week     Comment: socially    Current Medications and Allergies:   .  methocarbamol (ROBAXIN) 500 MG tablet, Take 500 mg by mouth every 8 (eight) hours as needed., Disp: , Rfl: 1 .  omeprazole (PRILOSEC) 40 MG capsule, Take 1 capsule (40 mg total) by mouth daily. [place], Disp: 30 capsule, Rfl: 11 .  sertraline (ZOLOFT) 100 MG tablet, Take 1 tablet (100 mg total) by mouth daily., Disp: 30 tablet, Rfl: 3 .  tamoxifen (NOLVADEX) 20 MG tablet, Take 1 tablet (20 mg total) by mouth daily., Disp: 30 tablet, Rfl: 5 .  traMADol (ULTRAM) 50 MG tablet, Take 1 tablet (50 mg total) by mouth every 6 (six) hours as needed., Disp: 60 tablet, Rfl: 0 .  zolpidem (AMBIEN) 10 MG tablet, Take 1/2 to 1 tab po qhs prn, Disp: 30 tablet, Rfl: 1  Allergies  Allergen Reactions  . Amoxicillin Shortness Of Breath and Itching    Has patient had a PCN reaction causing immediate rash, facial/tongue/throat swelling, SOB or lightheadedness with hypotension: Yes Has patient had a PCN reaction causing severe rash involving mucus membranes or skin necrosis: No Has patient had a PCN reaction that required hospitalization Yes Has patient had a PCN reaction occurring within the last 10 years: No If all of the above answers are "NO", then may proceed with Cephalosporin use.   . Diflucan [Fluconazole] Nausea Only    heartburn   Review of Systems:   Pertinent items are noted in the HPI. Otherwise, ROS is negative.  Vitals:   Vitals:   01/28/17 1056  BP: 118/78  Pulse: 89  Temp: 98.2 F (36.8 C)  TempSrc:  Oral  SpO2: 98%  Weight: 216 lb 9.6 oz (98.2 kg)  Height: 5\' 4"  (1.626 m)     Body mass index is 37.18 kg/m.  Physical Exam:   Physical Exam  Constitutional: She is oriented to person, place, and time. She appears well-developed and well-nourished. No distress.  HENT:  Head: Normocephalic and atraumatic.  Right Ear: External ear normal.  Left Ear: External ear normal.  Nose: Nose normal.  Mouth/Throat: Oropharynx is clear and moist.  Eyes: Pupils are equal, round, and reactive to light. EOM are normal.  Neck: Normal range of motion. Neck supple.  Cardiovascular: Normal rate, regular  rhythm, normal heart sounds and intact distal pulses.   Pulmonary/Chest: Effort normal and breath sounds normal.  Abdominal: Soft. Bowel sounds are normal.  Musculoskeletal: She exhibits tenderness.  Neurological: She is alert and oriented to person, place, and time. She displays normal reflexes. No cranial nerve deficit or sensory deficit. She exhibits normal muscle tone. Coordination normal.  Skin: Skin is warm.  Psychiatric: She has a normal mood and affect. Her behavior is normal.  Nursing note and vitals reviewed.  Assessment and Plan:   Problem  Fatigue   The patient has some deconditioning associated with her recent illness. She understands that this is associated with breast cancer, surgery, and chemotherapy. She was offered and accepted a prescription to the [facility] for exercise guidance.   Polyarthralgia   The patient has multiple joint pain including her shoulders, knees, hands, and back. She does not have a history of the same prior to chemotherapy. She is interested in a Rheumatology referral. Currently, she takes Ultram 4-5 pills daily. She states that this does not control her pain. She has tried anti-inflammatories in the past but says that they have done nothing for her. She is not interested in narcotics. The patient's Ultram was refilled today. I did ask her to try diclofenac twice a  day for baseline pain and inflammation.   Cognitive Change  Depression   Long history of the same with previous suicidal ideations and attempts. Patient is very open in speaking about this. She was previously followed by a therapist. She is currently involved in a few breast cancer survivor groups. Today she denies any concerns. She is stable on her current medications.   Obesity (Bmi 30-39.9)   The patient is asked to make an attempt to improve diet and exercise patterns to aid in medical management of this problem.    Breast Cancer of Upper-Outer Quadrant of Left Female Breast ([facility])   The patient recently finished chemotherapy. Her Oncologist is Dr. Jana Hakim.   Endometriosis   Patient is status post hysterectomy. She still retains her ovaries. She is interested with gynecology about this, stating that he still experiences pain from endometriosis.   History of Hysterectomy for Benign Disease   Orders Placed This Encounter  Procedures  . Rheumatoid factor  . Sedimentation rate  . C-reactive protein  . CBC with Differential/Platelet  . Comprehensive metabolic panel  . TSH  . Ambulatory referral to Neurology  . Ambulatory referral to Rheumatology   . Reviewed expectations re: course of current medical issues. . Discussed self-management of symptoms. . Outlined signs and symptoms indicating need for more acute intervention. . Patient verbalized understanding and all questions were answered. Marland Kitchen Health Maintenance issues including appropriate healthy diet, exercise, and smoking avoidance were discussed with patient. . See orders for this visit as documented in the electronic medical record. . Patient received an After Visit Summary.  CMA served as Education administrator during this visit. History, Physical, and Plan performed by medical provider. The above documentation has been reviewed and is accurate and complete. Briscoe Deutscher, D.O.  Briscoe Deutscher, DO [place] [place] 01/28/2017  Future Appointments Date Time Provider [place]  03/31/2017 2:00 PM Gardenia Phlegm, NP [facility] None  04/15/2017 3:30 PM [facility] LAB 6 [facility] None  04/15/2017 4:00 PM Magrinat, Virgie Dad, MD [facility] None  04/30/2017 11:00 AM Briscoe Deutscher, DO [facility] None

## 2017-01-29 ENCOUNTER — Telehealth: Payer: Self-pay | Admitting: Surgical

## 2017-01-29 ENCOUNTER — Telehealth: Payer: Self-pay | Admitting: Obstetrics and Gynecology

## 2017-01-29 ENCOUNTER — Telehealth: Payer: Self-pay | Admitting: Family Medicine

## 2017-01-29 LAB — RHEUMATOID FACTOR: Rhuematoid fact SerPl-aCnc: <14 IU/mL (ref <14)

## 2017-01-29 NOTE — Telephone Encounter (Signed)
Patient called in to check on two medications that she thought was going to be prescribed yesterday at the visit. I explained that we was waiting on lab results before prescribing. She said that it was a fluid pill for her swelling and Voltaren.

## 2017-01-29 NOTE — Telephone Encounter (Signed)
Patient called to inquire about medications needing to be refilled, transferred to [facility] to advise.

## 2017-01-29 NOTE — Telephone Encounter (Signed)
Okay for Voltaren. Okay for HCTZ 12.5 mg po daily prn edema. Recheck BMP in 1-2 weeks.

## 2017-01-29 NOTE — Telephone Encounter (Signed)
Patient called and states that her and Dr Talbert Nan discussed removing her ovaries after her cancer treatments.  Patient is done with treatment and wants to schedule a Surgery consult.

## 2017-01-30 ENCOUNTER — Encounter: Payer: Self-pay | Admitting: Psychology

## 2017-01-30 ENCOUNTER — Other Ambulatory Visit: Payer: Self-pay

## 2017-01-30 MED ORDER — DICLOFENAC SODIUM 75 MG PO TBEC: 75.0000 mg | DELAYED_RELEASE_TABLET | Freq: Two times a day (BID) | ORAL | 0 refills | Status: DC

## 2017-01-30 MED ORDER — HYDROCHLOROTHIAZIDE 12.5 MG PO CAPS: 12.5000 mg | ORAL_CAPSULE | Freq: Every day | ORAL | 3 refills | Status: DC

## 2017-01-30 NOTE — Telephone Encounter (Signed)
Return call to patient. Left message to call back and we will assist with schedule consult with Dr Talbert Nan. May also need annual as well.

## 2017-01-30 NOTE — Telephone Encounter (Signed)
Done please see other phone message.

## 2017-01-30 NOTE — Telephone Encounter (Signed)
Rx sent to pharmacy   

## 2017-02-02 NOTE — Telephone Encounter (Signed)
Last annual 09-27-15. Last office visit 03-20-2016. Call to patient.  Left message to call back.

## 2017-02-03 NOTE — Telephone Encounter (Signed)
Patient returning your call.

## 2017-02-04 NOTE — Telephone Encounter (Signed)
Call to patient. States she is out with son and minimal time to talk. States is beginning to have some endometriosis type pain and wants to begin plans for removal of ovaries.  Advised could start with annual so we can update her physical exam and make surgical plan from there. Patient agreeable. Annual scheduled for 02-24-17 with Dr Talbert Nan.   Routing to provider for final review. Patient agreeable to disposition. Will close encounter.

## 2017-02-19 ENCOUNTER — Encounter: Payer: Self-pay | Admitting: Family Medicine

## 2017-02-19 DIAGNOSIS — E559 Vitamin D deficiency, unspecified: Secondary | ICD-10-CM | POA: Insufficient documentation

## 2017-02-24 ENCOUNTER — Encounter: Payer: Self-pay | Admitting: Obstetrics and Gynecology

## 2017-02-24 ENCOUNTER — Ambulatory Visit (INDEPENDENT_AMBULATORY_CARE_PROVIDER_SITE_OTHER): Payer: BLUE CROSS/BLUE SHIELD | Admitting: Obstetrics and Gynecology

## 2017-02-24 VITALS — BP 138/80 | HR 84 | Resp 14 | Ht 64.0 in | Wt 215.0 lb

## 2017-02-24 DIAGNOSIS — R102 Pelvic and perineal pain: Secondary | ICD-10-CM | POA: Diagnosis not present

## 2017-02-24 DIAGNOSIS — N762 Acute vulvitis: Secondary | ICD-10-CM | POA: Diagnosis not present

## 2017-02-24 DIAGNOSIS — Z01419 Encounter for gynecological examination (general) (routine) without abnormal findings: Secondary | ICD-10-CM

## 2017-02-24 DIAGNOSIS — Z853 Personal history of malignant neoplasm of breast: Secondary | ICD-10-CM

## 2017-02-24 DIAGNOSIS — Z8742 Personal history of other diseases of the female genital tract: Secondary | ICD-10-CM

## 2017-02-24 MED ORDER — BETAMETHASONE VALERATE 0.1 % EX OINT: TOPICAL_OINTMENT | CUTANEOUS | 0 refills | Status: DC

## 2017-02-24 NOTE — Progress Notes (Addendum)
41 y.o. G1P1001 MarriedAfrican AmericanF here for annual exam. Patient wants to discuss having ovaries removed.  She is currently in remission from her breast cancer. She is having lots of side effects from the treatment. She took herself off of the tamoxifen secondary to pelvic pain and joint pain. Oncologist doesn't know that she is off of the tamoxifen yet.  She had a lumpectomy (and breast reduction at the same time), then chemotherapy and radiation. Negative BRCA testing.  Status post left breast upper outer quadrant biopsy 11/20/2015 for a clinical T1c N0, stage IA invasive ductal carcinoma, grade 2, estrogen receptor moderately positive, progesterone receptor negative, HER-2 equivocal by both immunohistochemistry and FISH She has a h/o endometriosis, had 2 laparoscopies prior to her hysterectomy. In 2007 she had a LAVH, no endometriosis was seen.  She was having hot flashes and night sweats when she was on tamoxifen, not during chemo and not currently.  She c/o cyclic pelvic pain. Last for about a week, sometimes lower level for another week. The pain can be severe 20/10 in severity. The very severe pain can last for up to 10 minutes at a time. The pain is intermittent, in her rectum, lower back and lower abdomen/vagina. She can have pain with BM during the week or two that she is hurting. No "endometriosis pain" while on chemo, only vaginal dryness. Vaginal dryness is now better.  Sexually active, having intermittent pain after intercourse (vagina/back/rectum).  She c/o irritation on her vulva    No LMP recorded. Patient has had a hysterectomy.          Sexually active: Yes.    The current method of family planning is status post hysterectomy.    Exercising: No.  The patient does not participate in regular exercise at present. Smoker:  no  Health Maintenance: Pap:  01/2015 WNL per patient  History of abnormal Pap:  no MMG:  05/2016-breast cancer- just finished treatments in  June Colonoscopy:  11-03-16 WNL  BMD:   Never TDaP:  unsure Gardasil: N/A   reports that she has never smoked. She has never used smokeless tobacco. She reports that she drinks alcohol. She reports that she does not use drugs. Son is 24. She is not currently working. She isn't able to work secondary to the fatigue and pain (joints, pelvis). Drinks about 4 glasses of wine a week.   Past Medical History:  Diagnosis Date  . Breast cancer ([place]) of left breast 11/20/2015  . Depression   . Endometriosis   . GAD (generalized anxiety disorder)   . GERD (gastroesophageal reflux disease)   . Herniated disc, cervical   . History of cancer chemotherapy   . History of stomach ulcers   . Hormone disorder   . IBD (inflammatory bowel disease)   . Migraine 01/28/2017  . Migraine without aura   . Panic attacks   . PONV (postoperative nausea and vomiting)   . Seasonal allergies     Past Surgical History:  Procedure Laterality Date  . LAPAROSCOPIC ASSISTED VAGINAL HYSTERECTOMY     Spared ovaries  . LAPAROSCOPY     x 2, diagnosed with endometriosis (prior to hysterectomy), only treated medically.  Marland Kitchen MASTOPEXY Bilateral 05/20/2016   Procedure: MASTOPEXY;  Surgeon: Irene Limbo, MD;  Location: [place];  Service: Plastics;  Laterality: Bilateral;  . PORTACATH PLACEMENT Right 12/25/2015   Procedure: INSERTION PORT-A-CATH WITH ULTRA SOUND;  Surgeon: [name], MD;  [facility];  Service: General;  Laterality: Right;  .  RADIOACTIVE SEED GUIDED MASTECTOMY WITH AXILLARY SENTINEL LYMPH NODE BIOPSY Left 05/12/2016   Procedure: BREAST LUMPECTOMY WITH RADIOACTIVE SEED AND SENTINEL LYMPH NODE BIOPSY AND BLUE DYE INJECTION;  Surgeon: [name], MD;  Location: [place];  Service: General;  Laterality: Left;    Current Outpatient Prescriptions  Medication Sig Dispense Refill  . diclofenac (VOLTAREN) 75 MG EC tablet Take 1 tablet (75 mg total) by mouth 2  (two) times daily. 60 tablet 0  . hydrochlorothiazide (MICROZIDE) 12.5 MG capsule Take 1 capsule (12.5 mg total) by mouth daily. 30 capsule 3  . omeprazole (PRILOSEC) 40 MG capsule Take 1 capsule (40 mg total) by mouth daily. 30 MINUTES BEFORE BREAKFAST 30 capsule 11  . sertraline (ZOLOFT) 100 MG tablet Take 1 tablet (100 mg total) by mouth daily. 30 tablet 3  . traMADol (ULTRAM) 50 MG tablet Take 1 tablet (50 mg total) by mouth every 6 (six) hours as needed. 60 tablet 0  . zolpidem (AMBIEN) 10 MG tablet Take 1/2 to 1 tab po qhs prn 30 tablet 1   No current facility-administered medications for this visit.     Family History  Problem Relation Age of Onset  . Sarcoidosis Mother   . Hypertension Father   . Throat cancer Maternal Grandfather        smoker and heavy drinker; dx in his late 68s-50s  . Cancer Paternal Grandmother        possible gastric vs bladder cancer  . Bladder Cancer Paternal Grandmother   . Breast cancer Paternal Aunt        dxin her 89s; dad's maternal half sister  . Breast cancer Other        PGFs mother  . Anesthesia problems Neg Hx   . Hypotension Neg Hx   . Malignant hyperthermia Neg Hx   . Pseudochol deficiency Neg Hx   . Colon cancer Neg Hx   . Stomach cancer Neg Hx   . Rectal cancer Neg Hx   . Esophageal cancer Neg Hx   . Liver cancer Neg Hx     Review of Systems  Constitutional: Negative.   HENT: Negative.   Eyes: Negative.   Respiratory: Negative.   Cardiovascular: Negative.   Gastrointestinal: Negative.   Endocrine: Negative.   Genitourinary: Positive for dyspareunia.       Labia and rectal itching Loss of sexual interest Left breast pain - post breast CA treatment Pelvic pain/ lower back pain  Musculoskeletal: Positive for myalgias.       Muscle weakness  Skin: Negative.   Allergic/Immunologic: Negative.   Neurological: Negative.   Psychiatric/Behavioral: Negative.   She is wondering about a yeast infection on her skin, no d/c.    Exam:   BP 138/80 (BP Location: Right Arm, Patient Position: Sitting, Cuff Size: Normal)   Pulse 84   Resp 14   Ht '5\' 4"'  (1.626 m)   Wt 215 lb (97.5 kg)   BMI 36.90 kg/m   Weight change: '@WEIGHTCHANGE' @ Height:   Height: '5\' 4"'  (162.6 cm)  Ht Readings from Last 3 Encounters:  02/24/17 '5\' 4"'  (1.626 m)  01/28/17 '5\' 4"'  (1.626 m)  01/09/17 '5\' 4"'  (1.626 m)    General appearance: alert, cooperative and appears stated age Head: Normocephalic, without obvious abnormality, atraumatic Neck: no adenopathy, supple, symmetrical, trachea midline and thyroid normal to inspection and palpation Lungs: clear to auscultation bilaterally Cardiovascular: regular rate and rhythm Breasts: evidence of bilateral breast reconstruction. No lumps or skin changes.  Breast are tender bilaterally. Abdomen: soft, non-tender; bowel sounds normal; no masses,  no organomegaly Extremities: extremities normal, atraumatic, no cyanosis or edema Skin: Skin color, texture, turgor normal. No rashes or lesions Lymph nodes: Cervical, supraclavicular, and axillary nodes normal. No abnormal inguinal nodes palpated Neurologic: Grossly normal   Pelvic: External genitalia:  no lesions              Urethra:  normal appearing urethra with no masses, tenderness or lesions              Bartholins and Skenes: normal                 Vagina: normal appearing vagina with normal color and discharge, no lesions              Cervix: absent               Bimanual Exam:  Uterus:  uterus absent              Adnexa: no mass, fullness, tenderness               Rectovaginal: Confirms               Anus:  normal sphincter tone, no lesions  Pelvic floor: tender on the left  Chaperone was present for exam.  A:  Well Woman with normal exam  Vulvar irritation, normal exam  H/O breast cancer, was on tamoxifen, stopped taking secondary to side effects  She has a h/o endometriosis and is having worsening cyclic abdominal/pelvic pain  She has  some pain with BM cyclically, recently with normal colonoscopy  Pelvic floor tenderness  P:   No pap needed  FSH, estradiol  Affirm  Given the severity of the cyclic pain, I recommend we proceed with diagnostic laparoscopy, BSO, possible treatment of endometriosis. We discussed the risks of surgery, including but not limited to: risks of damage to bowel/bladder/vessels or ureters.   We discussed that this will cause surgical menopause, discussed possible treatment options (won't be able to take HRT)  I have reviewed her last surgery note from 2007, LAVH, no signs of endometriosis were noted at that surgery  Mammogram this fall  She will f/u with Dr Jana Hakim  Addendum: steroid ointment was given for vulvar irritation  CC: Dr Jana Hakim

## 2017-02-25 LAB — ESTRADIOL: Estradiol: 70.4 pg/mL

## 2017-02-25 LAB — VAGINITIS/VAGINOSIS, DNA PROBE
Candida Species: NEGATIVE
Gardnerella vaginalis: NEGATIVE
Trichomonas vaginosis: NEGATIVE

## 2017-02-25 LAB — FOLLICLE STIMULATING HORMONE: FSH: 27.8 m[IU]/mL

## 2017-03-01 IMAGING — DX DG CHEST 2V
2 series · 2 of 2 positions shown · non-contrast
Comparison: Thoracic spine radiographs performed 04/29/2014

CLINICAL DATA: Acute onset of left-sided chest pain and cough.
Initial encounter.

EXAM:
CHEST  2 VIEW

[chest pa]
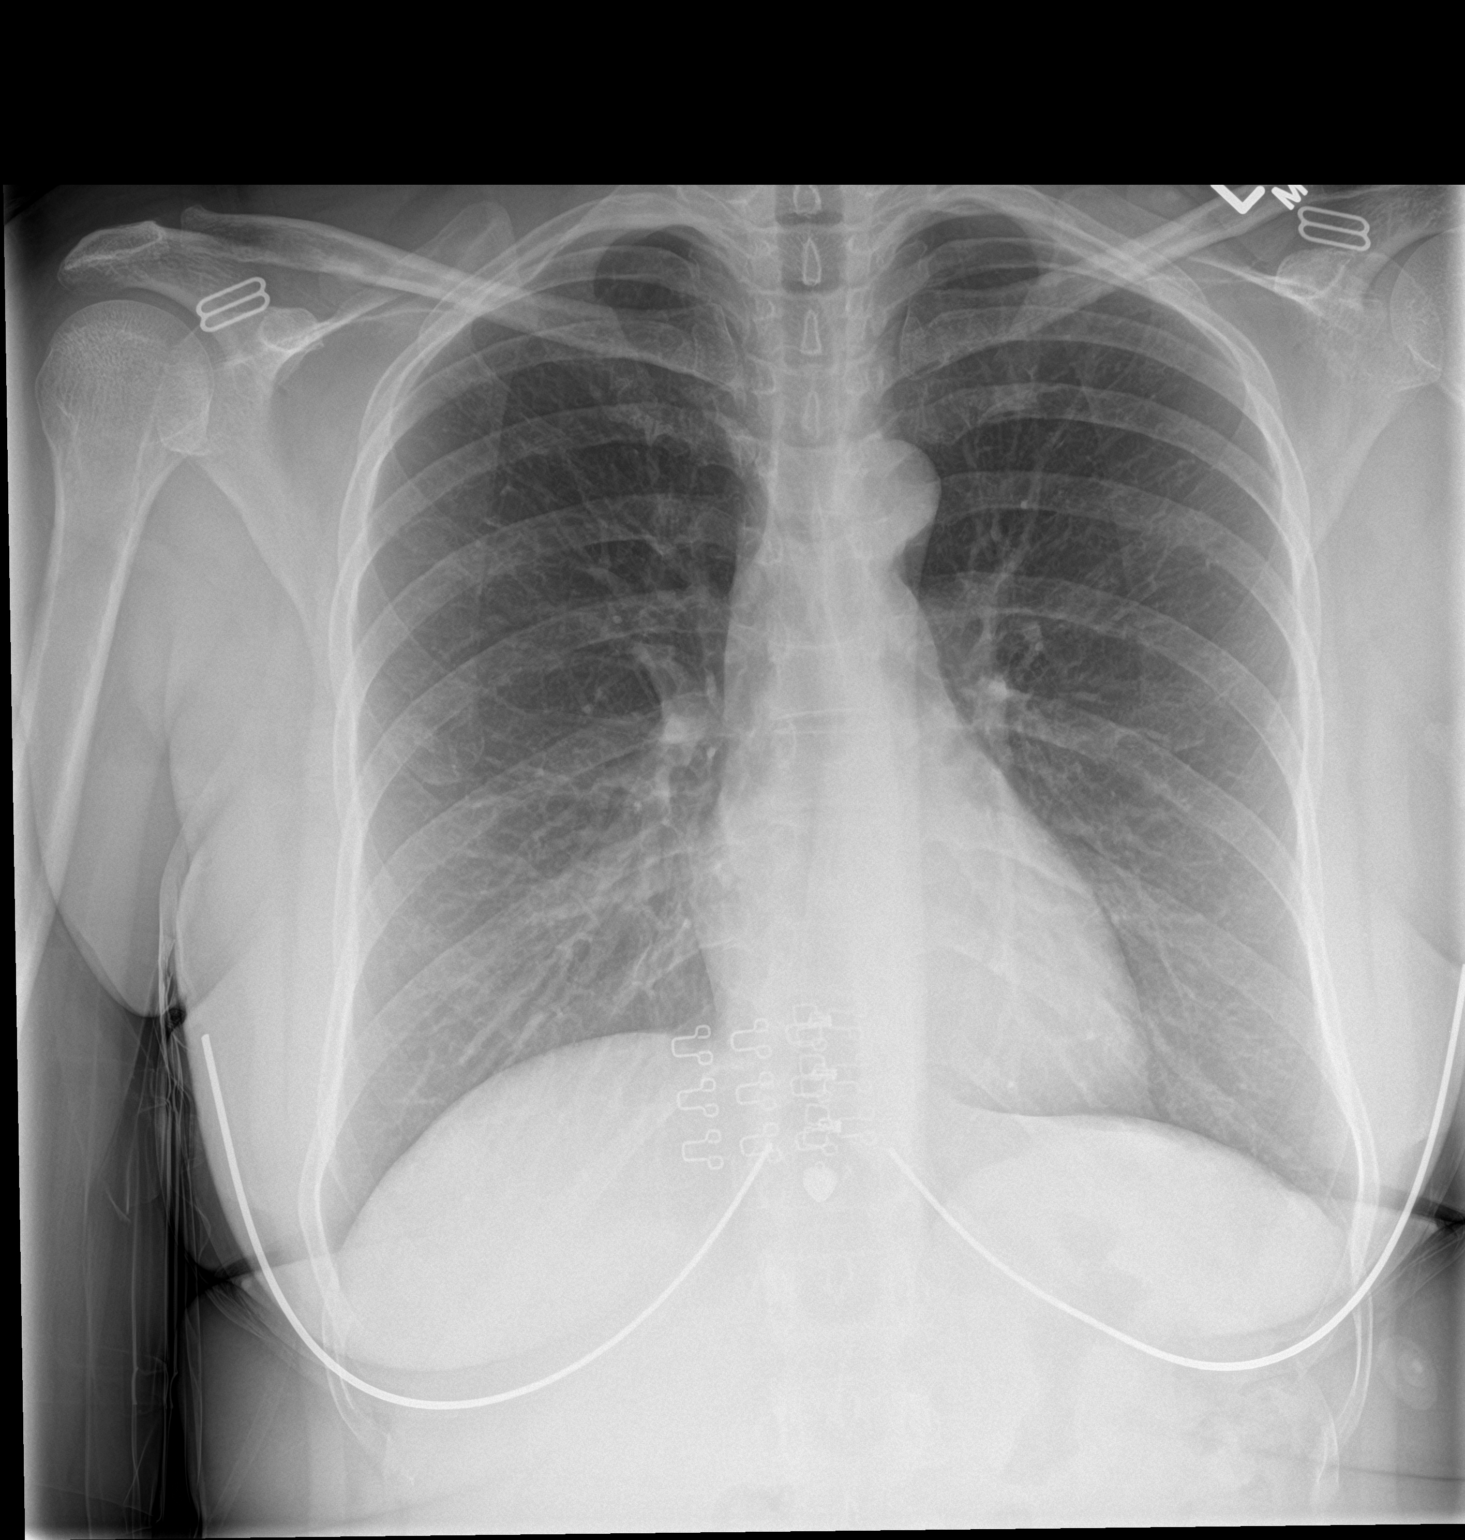

[chest lat]
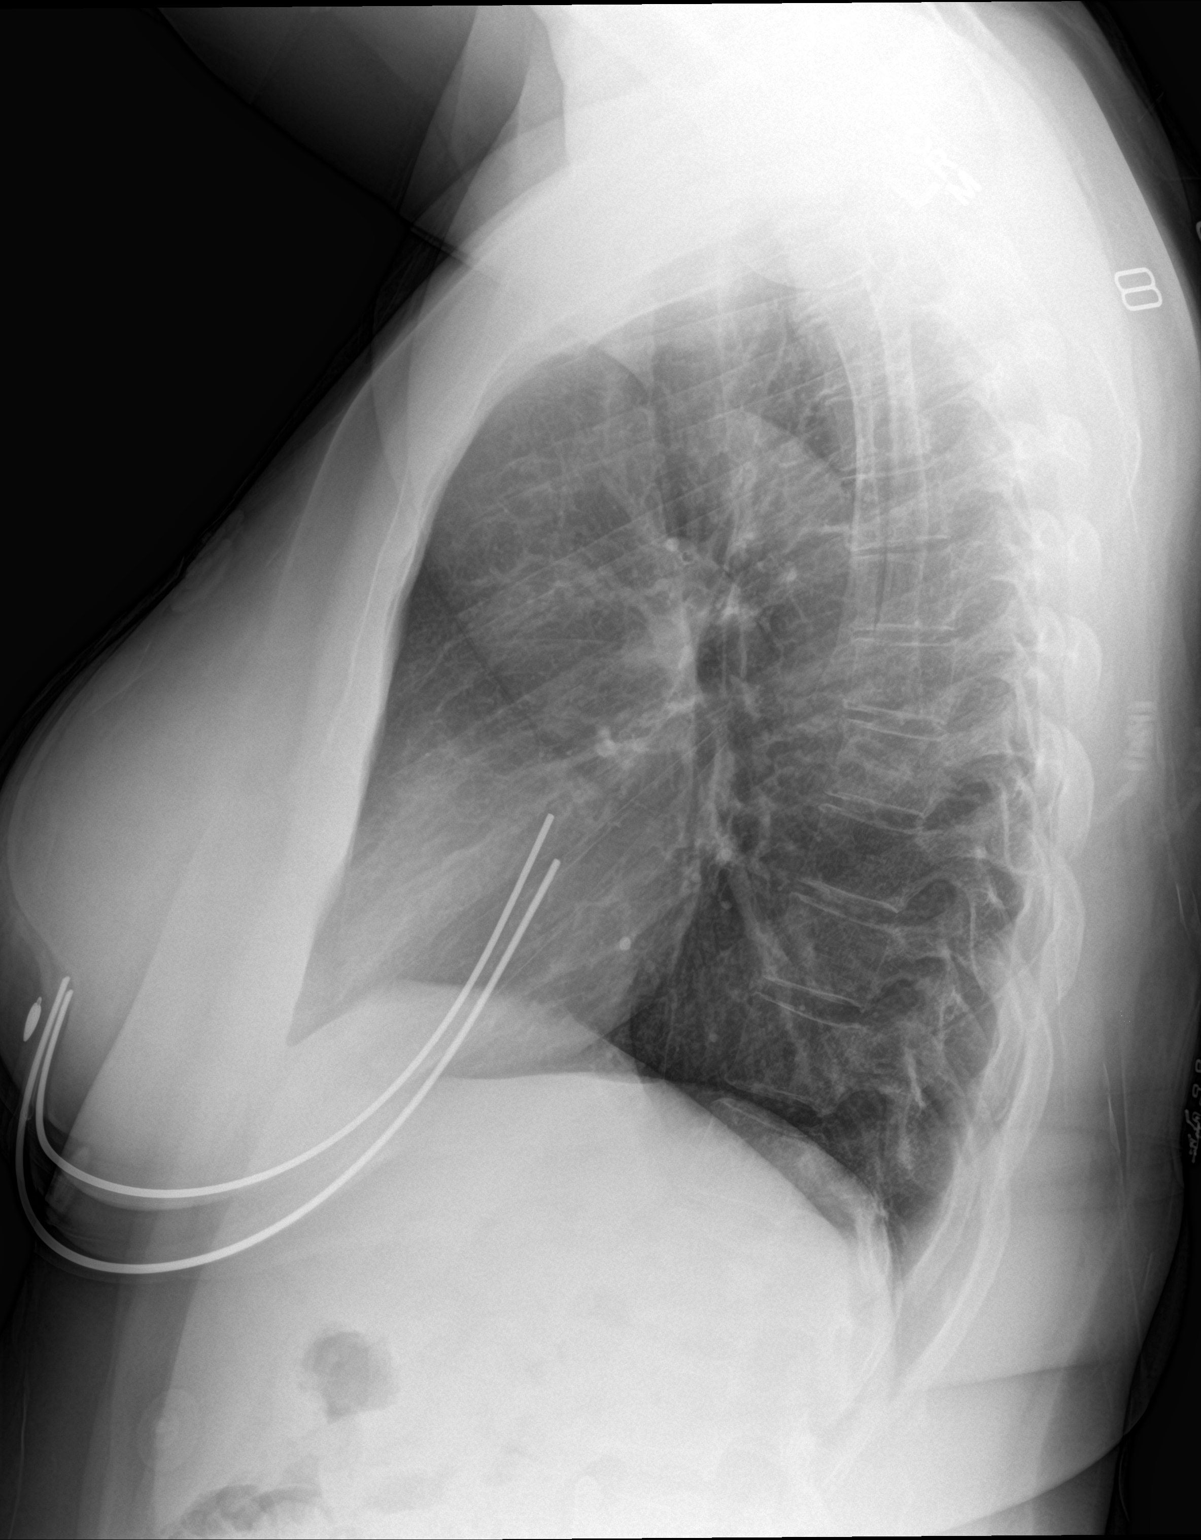

[2 of 2 positions shown; findings below may reference images not displayed]

FINDINGS: The lungs are well-aerated and clear. There is no evidence of focal
opacification, pleural effusion or pneumothorax.

The heart is normal in size; the mediastinal contour is within
normal limits. No acute osseous abnormalities are seen.
IMPRESSION: No acute cardiopulmonary process seen.

## 2017-03-02 IMAGING — CT CT ANGIO CHEST
2 of 6 series · 19 of 36 positions shown · IV contrast (Omni 300)
Comparison: Chest radiograph 1 day prior.

CLINICAL DATA: Left-sided chest pain. Cough. Symptoms for 3 days.
Recent diagnosis of left breast cancer.

EXAM:
CT ANGIOGRAPHY CHEST WITH CONTRAST
TECHNIQUE: Multidetector CT imaging of the chest was performed using the
standard protocol during bolus administration of intravenous
contrast. Multiplanar CT image reconstructions and MIPs were
obtained to evaluate the vascular anatomy.
CONTRAST:  100 cc Isovue 370 IV

[Series 7: pe thins · axial · 0.68mm/px · z∈[-264,-17]mm · 18 of 547 slices shown]
[im 27/547  lung]
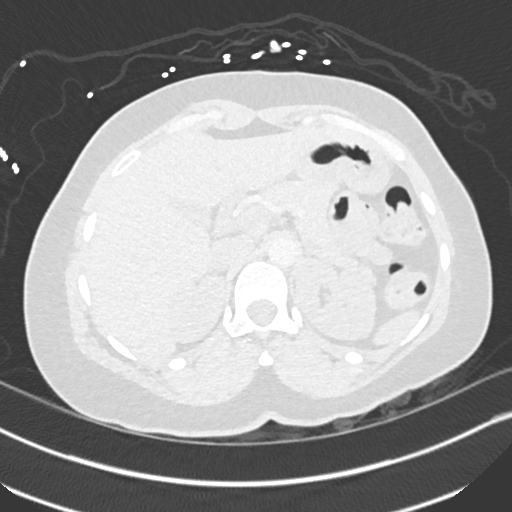
[im 53/547  mediastinal]
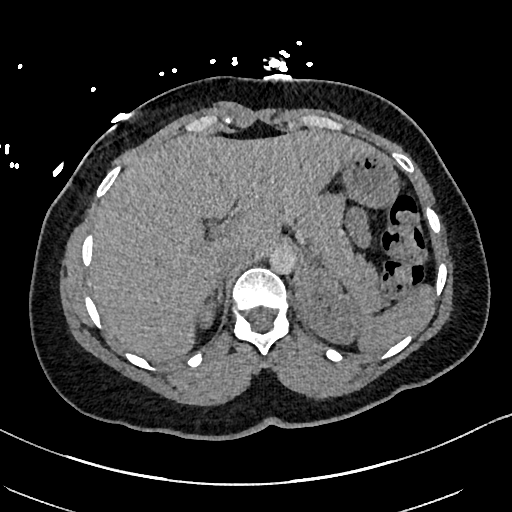
[im 79/547  lung]
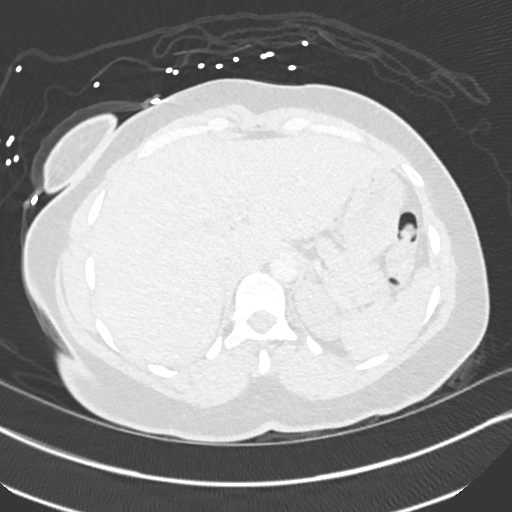
[im 105/547  mediastinal]
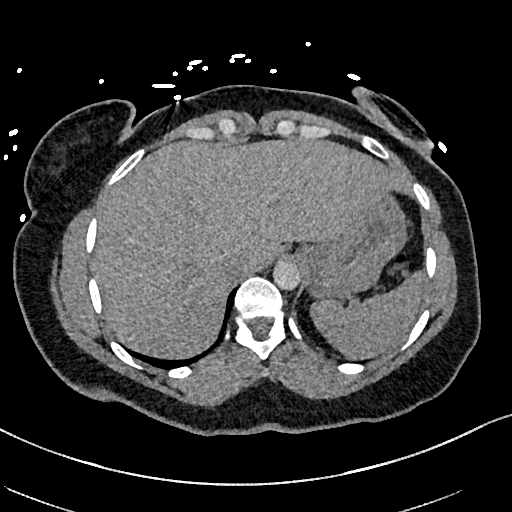
[im 157/547  lung]
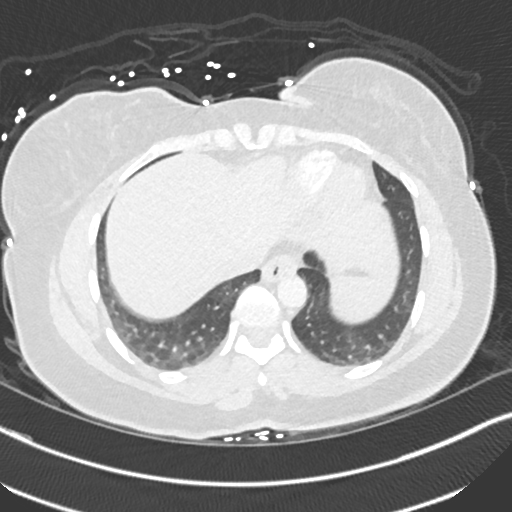
[im 183/547  mediastinal]
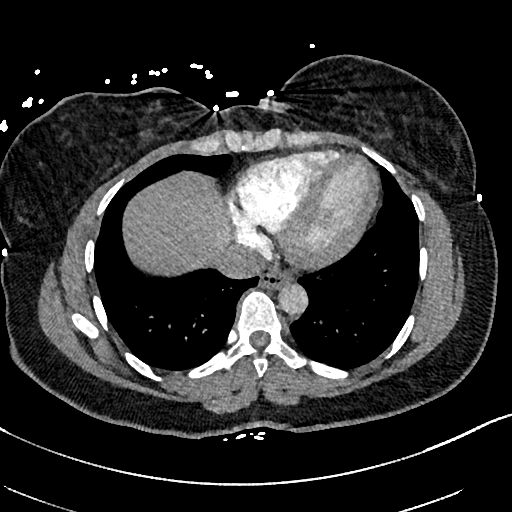
[im 209/547  lung]
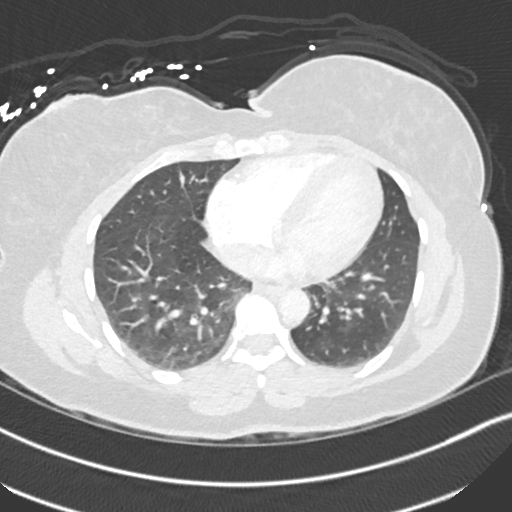
[im 235/547  mediastinal]
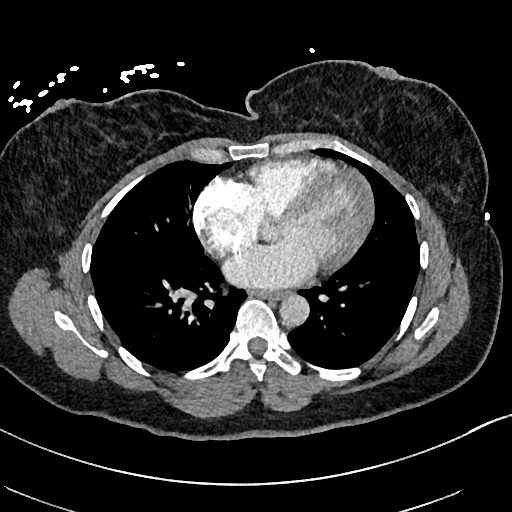
[im 261/547  lung]
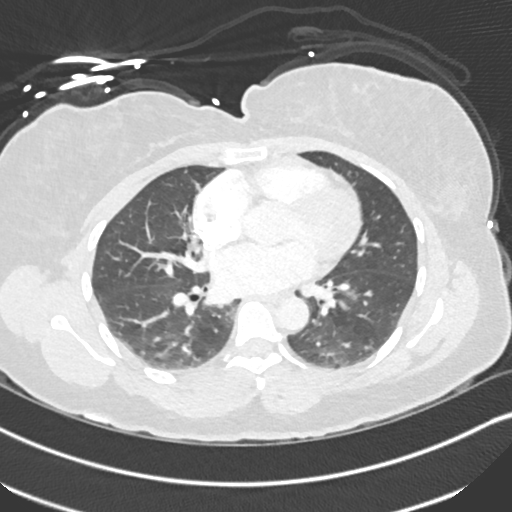
[im 287/547  mediastinal]
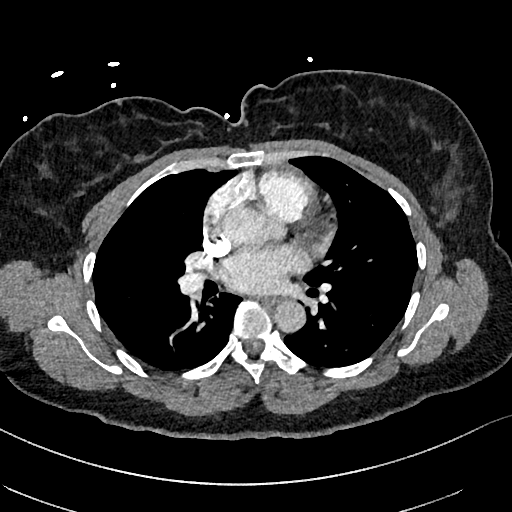
[im 313/547  lung]
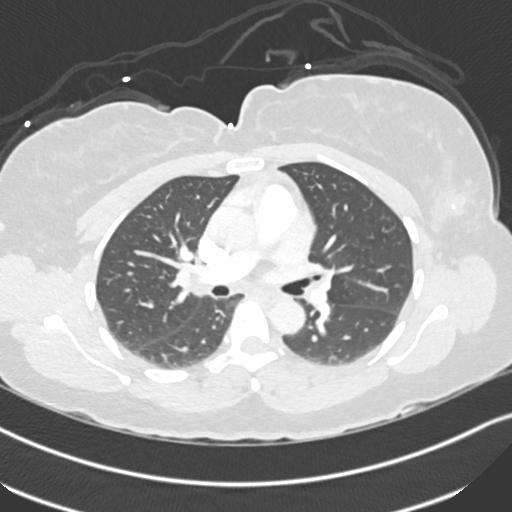
[im 339/547  mediastinal]
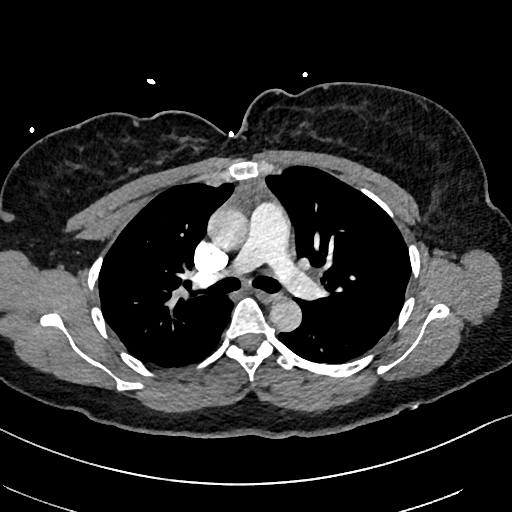
[im 365/547  lung]
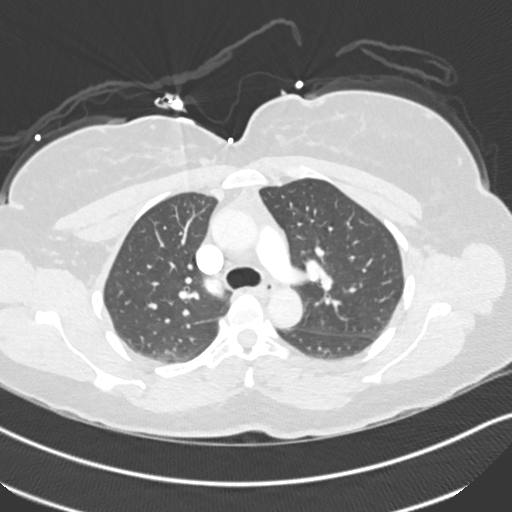
[im 417/547  mediastinal]
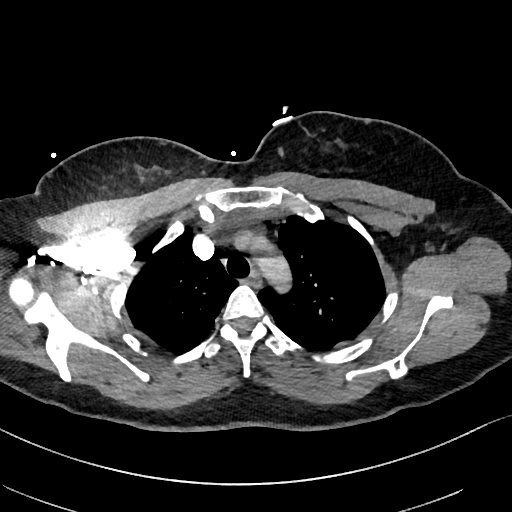
[im 443/547  lung]
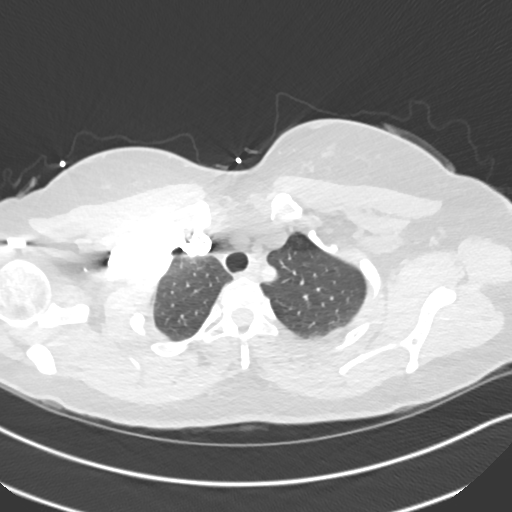
[im 469/547  mediastinal]
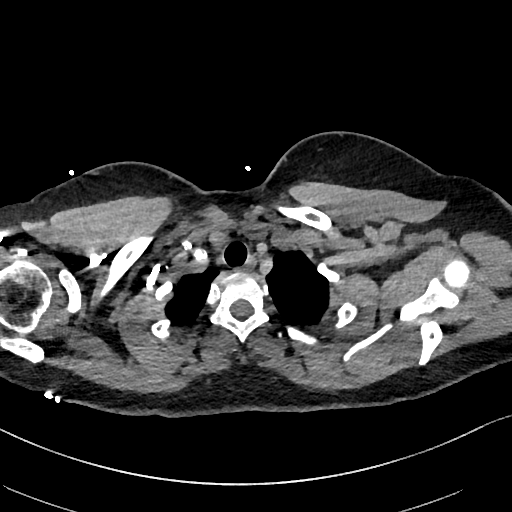
[im 495/547  lung]
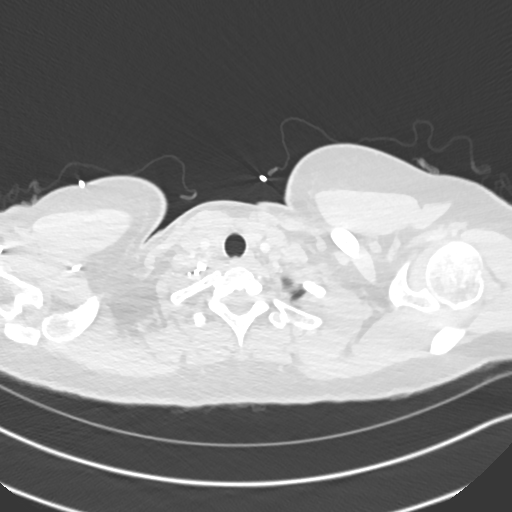
[im 521/547  mediastinal]
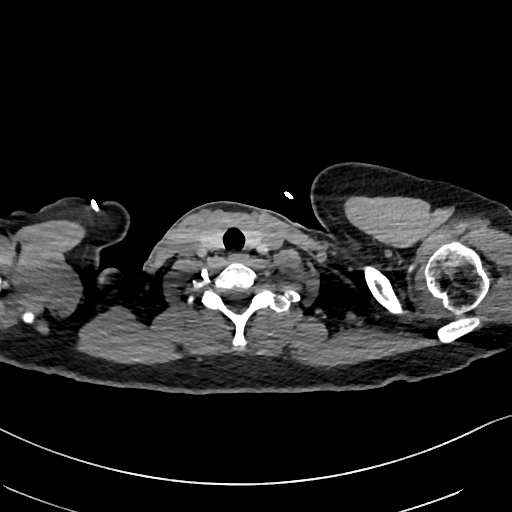

[Series 8: pe 2mm cor · coronal · 0.59mm/px · 1 of 107 slices shown]
[im 54/107  mediastinal]
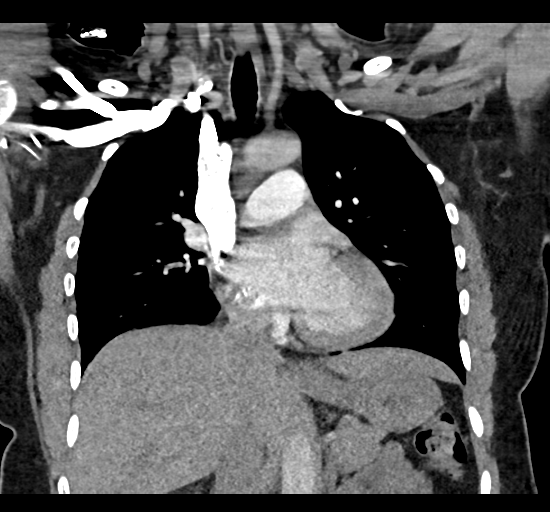

[19 of 36 positions shown; findings below may reference images not displayed]

FINDINGS: There are no filling defects within the pulmonary arteries to
suggest pulmonary embolus to the level of the segmental branches.
The subsegmental branches cannot be assessed due to contrast bolus
timing. Normal caliber thoracic aorta without evidence of
dissection. There is a prominent right hilar lymph node measuring 12
mm. Subcarinal lymph node measures 10 mm. No left hilar adenopathy.
No pericardial effusion.

Heterogeneous attenuation of the lower lobes may be due to air
trapping or atelectasis. No confluent consolidation. Linear
atelectasis in the right upper lobe. No pulmonary nodule or mass. No
pleural effusion.

No enlarged axillary lymph nodes. Nodule in the left breast measures
13 mm. No acute abnormality in the upper abdomen.

There are no acute or suspicious osseous abnormalities. No blastic
or destructive lytic lesions.

Review of the MIP images confirms the above findings.
IMPRESSION: 1. No pulmonary embolus.
2. Prominent right hilar lymph node measures 12 mm, nonspecific.
3. Heterogeneous attenuation of the lower lobes may be due to air
trapping or atelectasis.

## 2017-03-03 ENCOUNTER — Telehealth: Payer: Self-pay | Admitting: Obstetrics and Gynecology

## 2017-03-03 NOTE — Telephone Encounter (Signed)
Call to patient to discuss surgery date options. Patient requests early October surgery date. Dates discussed and she will review with family and call back.

## 2017-03-03 NOTE — Telephone Encounter (Signed)
Patient is calling to let Gay Filler know 04/27/17 will be the best date for her for surgery.

## 2017-03-03 NOTE — Telephone Encounter (Signed)
Return call to patient. Advised 10-22 is not available. Can have 10-15 or 10-29. Patient requests 05-04-17. Advised will schedule and call her back to confirm and complete surgery information.

## 2017-03-03 NOTE — Telephone Encounter (Signed)
Spoke with patient regarding benefit for recommended surgery. Patient understood and agreeable. Patient ready to schedule, but advises she would like to plan on an October surgery date. Patient aware this is professional benefit only. Patient aware will be contacted by hospital for separate benefits. Forwarding to Conservation officer, historic buildings for scheduling.  Forwarding to Lamont Snowball, RN

## 2017-03-06 NOTE — Telephone Encounter (Signed)
Call to patient to complete surgery arrangements. Surgery is scheduled for 05-04-17 at 0730 at [facility]. Left message to call back.

## 2017-03-12 NOTE — Telephone Encounter (Signed)
Return call to Sally. °

## 2017-03-12 NOTE — Telephone Encounter (Signed)
Return call to patient. Surgery date of 05-04-17 at 0730 at [facility] confirmed with patient. Surgery instruction sheet reviewed and printed copy mailed with surgery center brochure. Questions answered and instructed to call back as needed.  Routing to provider for final review. Patient agreeable to disposition. Will close encounter.

## 2017-03-20 ENCOUNTER — Encounter: Payer: Self-pay | Admitting: Pharmacist

## 2017-03-20 DIAGNOSIS — Z171 Estrogen receptor negative status [ER-]: Secondary | ICD-10-CM

## 2017-03-20 DIAGNOSIS — C50412 Malignant neoplasm of upper-outer quadrant of left female breast: Secondary | ICD-10-CM

## 2017-03-20 NOTE — Progress Notes (Signed)
Telephone documentation  Study code: [id number]  Attempted to call patient to follow-up on "Pharmacogenetic analysis of toxicities related to administration of taxanes in breast cancer patients" study. Unable to reach patient. Provided my office number for the patient to call back.  Darl Pikes, PharmD, BCPS Hematology/Oncology Clinical Pharmacist [facility] [phone]

## 2017-03-22 IMAGING — DX DG CHEST 1V PORT
1 series · 1 of 1 positions shown · non-contrast
Comparison: PA and lateral chest 12/04/2015.  CT chest 12/05/2015.

CLINICAL DATA: Status post Port-A-Cath placement on the right
today.

EXAM:
PORTABLE CHEST 1 VIEW

[chest ap]
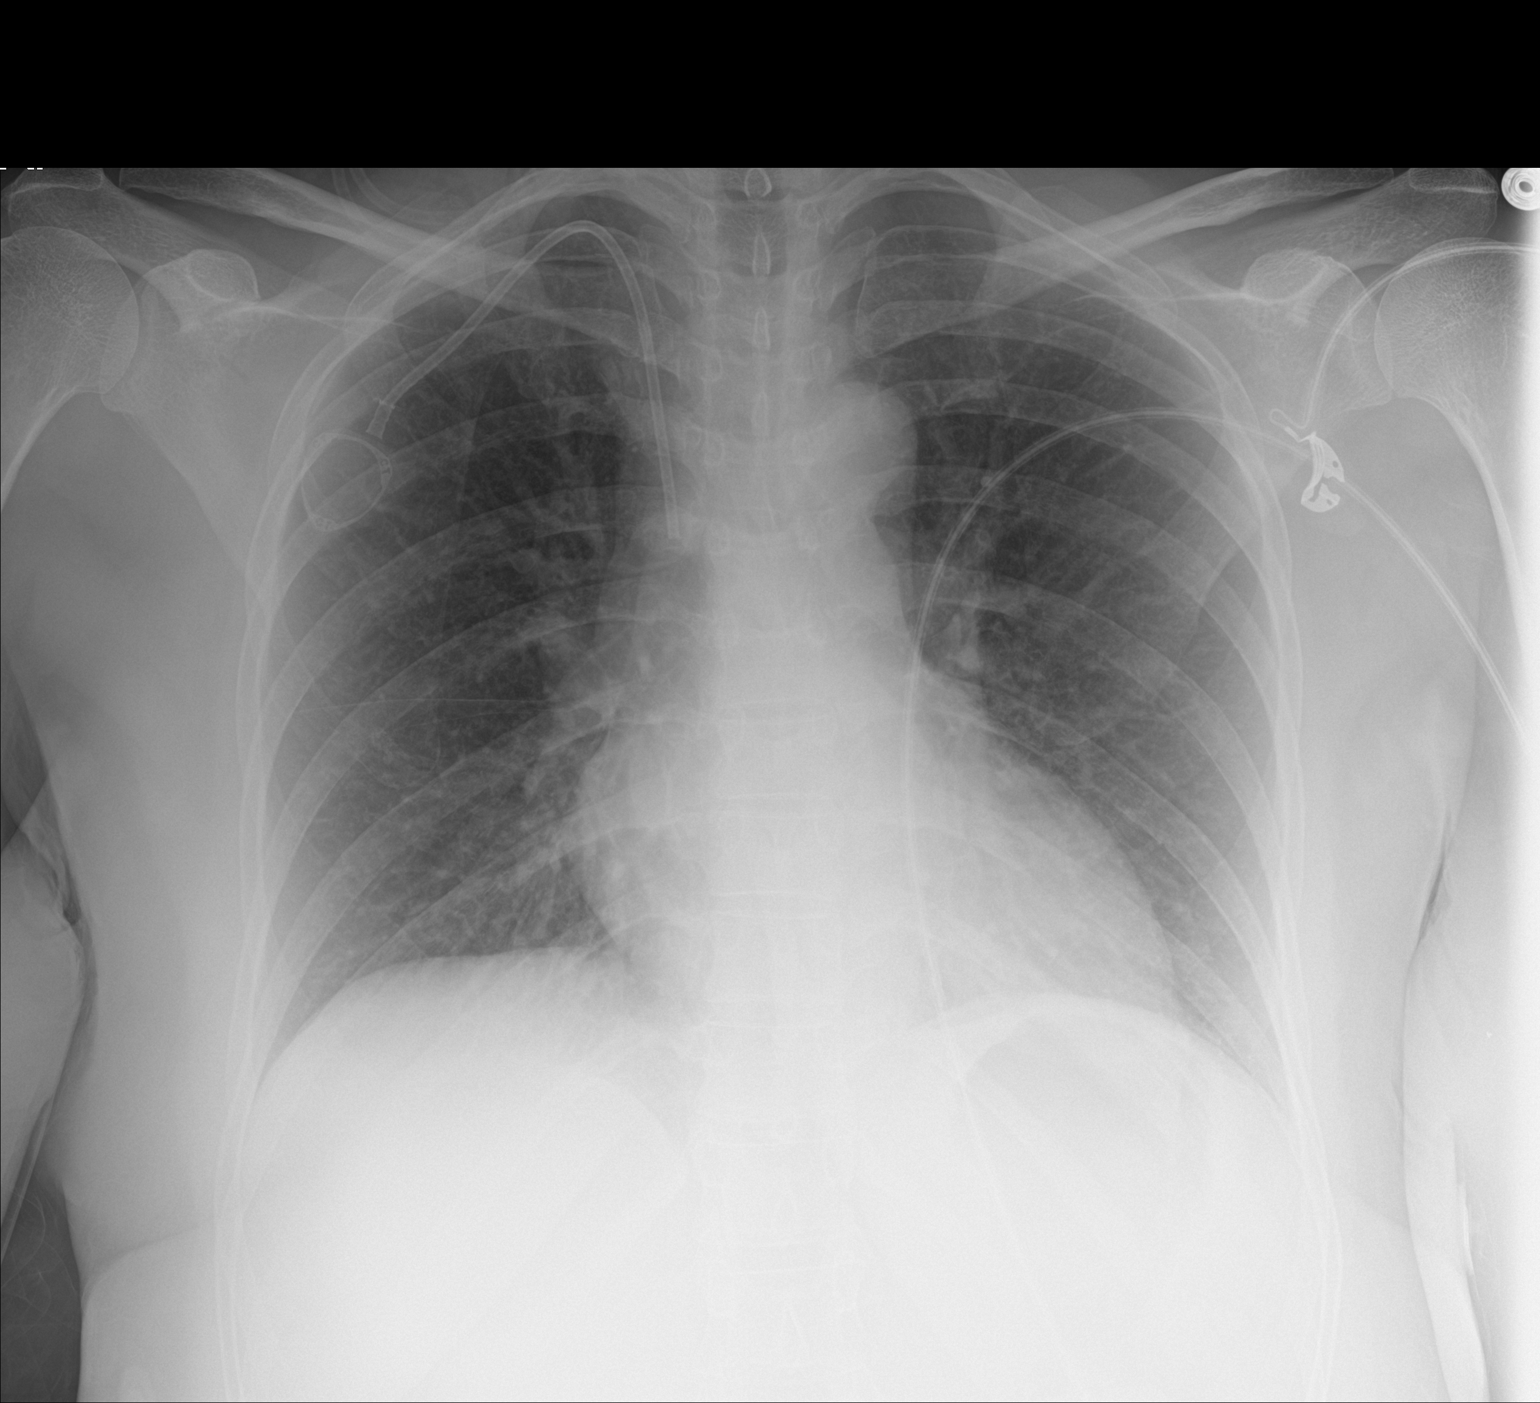

[1 of 1 positions shown; findings below may reference images not displayed]

FINDINGS: New right IJ approach Port-A-Cath is in place with the tip
projecting in the mid superior vena cava. Tubing is intact. Lungs
are clear. No pneumothorax. Heart size normal. No focal bony
abnormality.
IMPRESSION: Tip of Port-A-Cath projects in the mid superior vena cava. Negative
for pneumothorax or acute disease.

## 2017-03-23 ENCOUNTER — Other Ambulatory Visit: Payer: Self-pay | Admitting: Oncology

## 2017-03-30 ENCOUNTER — Telehealth: Payer: Self-pay

## 2017-03-30 NOTE — Telephone Encounter (Signed)
Left VM reminding patient of SCP visit 03/31/17 and to call center with questions.

## 2017-03-31 ENCOUNTER — Ambulatory Visit (HOSPITAL_BASED_OUTPATIENT_CLINIC_OR_DEPARTMENT_OTHER): Payer: BLUE CROSS/BLUE SHIELD | Admitting: Adult Health

## 2017-03-31 VITALS — HR 85 | Temp 98.3°F | Resp 20 | Ht 64.0 in | Wt 212.1 lb

## 2017-03-31 DIAGNOSIS — C50412 Malignant neoplasm of upper-outer quadrant of left female breast: Secondary | ICD-10-CM | POA: Diagnosis not present

## 2017-03-31 DIAGNOSIS — C50219 Malignant neoplasm of upper-inner quadrant of unspecified female breast: Secondary | ICD-10-CM

## 2017-03-31 DIAGNOSIS — Z171 Estrogen receptor negative status [ER-]: Secondary | ICD-10-CM

## 2017-03-31 DIAGNOSIS — Z79810 Long term (current) use of selective estrogen receptor modulators (SERMs): Secondary | ICD-10-CM

## 2017-03-31 DIAGNOSIS — Z170 Estrogen receptor positive status [ER+]: Secondary | ICD-10-CM

## 2017-03-31 DIAGNOSIS — M2550 Pain in unspecified joint: Secondary | ICD-10-CM

## 2017-03-31 MED ORDER — TRAMADOL HCL 50 MG PO TABS: 50.0000 mg | ORAL_TABLET | Freq: Four times a day (QID) | ORAL | 0 refills | Status: DC | PRN

## 2017-03-31 MED ORDER — ZOLPIDEM TARTRATE 10 MG PO TABS: ORAL_TABLET | ORAL | 1 refills | Status: DC

## 2017-03-31 NOTE — Progress Notes (Signed)
CLINIC:  Survivorship   REASON FOR VISIT:  Routine follow-up post-treatment for a recent history of breast cancer.  BRIEF ONCOLOGIC HISTORY:    Breast cancer of upper-outer quadrant of left female breast ([place])   11/20/2015 Initial Biopsy    Left breast upper outer quadrant biopsy: IDC, grade 2, ER+, PR-, HER-2 equivocal by both IHC and FISH.       12/11/2015 Initial Diagnosis    Breast cancer of upper-outer quadrant of left female breast ([place])     01/01/2016 - 04/15/2016 Neo-Adjuvant Chemotherapy    Carboplatin Docetaxel Trastuzumab and pertuzumab every 21 days x 6 starting 6/27/20147 through 04/15/2016      01/03/2016 Genetic Testing    Genetic testing was normal, and did not reveal a deleterious mutation in these genes. Genetic testing did detect two Variants of Unknown Significance, one in the ATM gene called c.7400T>C and the other in the PMS2 gene called c.572A>G.  Genes tested include: ATM, BARD1, BRCA1, BRCA2, BRIP1, CDH1, CHEK2, EPCAM, FANCC, MLH1, MSH2, MSH6, NBN, PALB2, PMS2, PTEN, RAD51C, RAD51D, TP53, and XRCC2.      05/06/2016 - 01/09/2017 Adjuvant Chemotherapy    Maintenance Trastuzumab       05/12/2016 Surgery    Left lumpectomy and SLNB: complete pathologic response      07/28/2016 - 09/11/2016 Radiation Therapy    1) Left breast/ 50.4 Gy in 28 fractions  2) Left breast boost/ 10 Gy in 5 fractions      09/2016 -  Anti-estrogen oral therapy    Tamoxifen       INTERVAL HISTORY:  [name] presents to the [facility] today for our initial meeting to review her survivorship care plan detailing her treatment course for breast cancer, as well as monitoring long-term side effects of that treatment, education regarding health maintenance, screening, and overall wellness and health promotion.     Overall, [name] reports feeling moderately well.  She is not taking an anti-estrogens due to difficulty tolerating the side effects.  She has recently been  diagnosed with fibromyalgia, inflammatory arthritis and is followed by a rheumatologist.  She is having difficulty with memory and forgetfulness.  She has appointment with neurology in November for this.  Endometriosis pain has returned and she is having bilateral oopherectomy in October, 2018.      REVIEW OF SYSTEMS:  Review of Systems  Constitutional: Negative for appetite change, chills, fatigue, fever and unexpected weight change.  HENT:   Negative for hearing loss and lump/mass.   Eyes: Negative for eye problems and icterus.  Respiratory: Negative for chest tightness, cough and shortness of breath.   Cardiovascular: Negative for chest pain, leg swelling and palpitations.  Gastrointestinal: Negative for abdominal distention, abdominal pain, constipation, diarrhea, nausea and vomiting.  Endocrine: Negative for hot flashes.  Genitourinary: Negative for difficulty urinating.   Musculoskeletal: Positive for arthralgias, back pain and myalgias.  Neurological: Negative for dizziness and extremity weakness.  Hematological: Negative for adenopathy. Does not bruise/bleed easily.  Psychiatric/Behavioral: Negative for depression. The patient is not nervous/anxious.   Breast: Denies any new nodularity, masses, tenderness, nipple changes, or nipple discharge.      ONCOLOGY TREATMENT TEAM:  1. Surgeon:  Dr. Donne Hazel  at [facility] 2. Medical Oncologist: Dr. Jana Hakim  3. Radiation Oncologist: Dr. Lisbeth Renshaw    PAST MEDICAL/SURGICAL HISTORY:  Past Medical History:  Diagnosis Date  . Breast cancer ([place]) of left breast 11/20/2015  . Depression   . Endometriosis   . GAD (  generalized anxiety disorder)   . GERD (gastroesophageal reflux disease)   . Herniated disc, cervical   . History of cancer chemotherapy   . History of stomach ulcers   . Hormone disorder   . IBD (inflammatory bowel disease)   . Migraine 01/28/2017  . Migraine without aura   . Panic attacks   . PONV  (postoperative nausea and vomiting)   . Seasonal allergies    Past Surgical History:  Procedure Laterality Date  . LAPAROSCOPIC ASSISTED VAGINAL HYSTERECTOMY     Spared ovaries  . LAPAROSCOPY     x 2, diagnosed with endometriosis (prior to hysterectomy), only treated medically.  Marland Kitchen MASTOPEXY Bilateral 05/20/2016   Procedure: MASTOPEXY;  Surgeon: Irene Limbo, MD;  Location: [facility];  Service: Plastics;  Laterality: Bilateral;  . PORTACATH PLACEMENT Right 12/25/2015   Procedure: INSERTION PORT-A-CATH WITH ULTRA SOUND;  Surgeon: [name], MD;  Location: [facility];  Service: General;  Laterality: Right;  . RADIOACTIVE SEED GUIDED MASTECTOMY WITH AXILLARY SENTINEL LYMPH NODE BIOPSY Left 05/12/2016   Procedure: BREAST LUMPECTOMY WITH RADIOACTIVE SEED AND SENTINEL LYMPH NODE BIOPSY AND BLUE DYE INJECTION;  Surgeon: [name], MD;  Location: [facility];  Service: General;  Laterality: Left;     ALLERGIES:  Allergies  Allergen Reactions  . Amoxicillin Shortness Of Breath and Itching    Has patient had a PCN reaction causing immediate rash, facial/tongue/throat swelling, SOB or lightheadedness with hypotension: Yes Has patient had a PCN reaction causing severe rash involving mucus membranes or skin necrosis: No Has patient had a PCN reaction that required hospitalization Yes Has patient had a PCN reaction occurring within the last 10 years: No If all of the above answers are "NO", then may proceed with Cephalosporin use.   Nestor Lewandowsky [Fluconazole] Nausea Only    heartburn     CURRENT MEDICATIONS:  Outpatient Encounter Prescriptions as of 03/31/2017  Medication Sig  . betamethasone valerate ointment (VALISONE) 0.1 % Apply a pea sized amount topically BID for 1-2 weeks.  . diclofenac (VOLTAREN) 75 MG EC tablet Take 1 tablet (75 mg total) by mouth 2 (two) times daily.  . hydrochlorothiazide (MICROZIDE) 12.5 MG capsule Take 1 capsule (12.5 mg  total) by mouth daily.  Marland Kitchen omeprazole (PRILOSEC) 40 MG capsule Take 1 capsule (40 mg total) by mouth daily. [name]  . predniSONE (DELTASONE) 10 MG tablet TAKE 1 TABLET BY MOUTH DAILY FOR 8 WEEKS  . sertraline (ZOLOFT) 100 MG tablet TAKE 1 TABLET BY MOUTH DAILY  . traMADol (ULTRAM) 50 MG tablet Take 1 tablet (50 mg total) by mouth every 6 (six) hours as needed.  . zolpidem (AMBIEN) 10 MG tablet Take 1/2 to 1 tab po qhs prn   No facility-administered encounter medications on file as of 03/31/2017.      ONCOLOGIC FAMILY HISTORY:  Family History  Problem Relation Age of Onset  . Sarcoidosis Mother   . Hypertension Father   . Throat cancer Maternal Grandfather        smoker and heavy drinker; dx in his late 58s-50s  . Cancer Paternal Grandmother        possible gastric vs bladder cancer  . Bladder Cancer Paternal Grandmother   . Breast cancer Paternal Aunt        dxin her 57s; dad's maternal half sister  . Breast cancer Other        PGFs mother  . Anesthesia problems Neg Hx   . Hypotension  Neg Hx   . Malignant hyperthermia Neg Hx   . Pseudochol deficiency Neg Hx   . Colon cancer Neg Hx   . Stomach cancer Neg Hx   . Rectal cancer Neg Hx   . Esophageal cancer Neg Hx   . Liver cancer Neg Hx      GENETIC COUNSELING/TESTING: See above  SOCIAL HISTORY:  [name] is married and lives in [place], [place].  She has 1 child and they live in [place].  [name] is currently unemployed.  She denies any current or history of tobacco, alcohol, or illicit drug use.     PHYSICAL EXAMINATION:  Vital Signs:   Vitals:   03/31/17 1418  Pulse: 85  Resp: 20  Temp: 98.3 F (36.8 C)  SpO2: 99%   Filed Weights   03/31/17 1418  Weight: 212 lb 1.6 oz (96.2 kg)   General: Well-nourished, well-appearing female in no acute distress.  She is unaccompanied today.   HEENT: Head is normocephalic.  Pupils equal and reactive to light. Conjunctivae clear  without exudate.  Sclerae anicteric. Oral mucosa is pink, moist.  Oropharynx is pink without lesions or erythema.  Lymph: No cervical, supraclavicular, or infraclavicular lymphadenopathy noted on palpation.  Cardiovascular: Regular rate and rhythm.Marland Kitchen Respiratory: Clear to auscultation bilaterally. Chest expansion symmetric; breathing non-labored.  GI: Abdomen soft and round; non-tender, non-distended. Bowel sounds normoactive.  GU: Deferred.  Neuro: No focal deficits. Steady gait.  Psych: Mood and affect normal and appropriate for situation.  Extremities: No edema. MSK: No focal spinal tenderness to palpation.  Full range of motion in bilateral upper extremities Skin: Warm and dry.  LABORATORY DATA:  None for this visit.  DIAGNOSTIC IMAGING:  None for this visit.      ASSESSMENT AND PLAN:  Ms.. [name] is a pleasant 41 y.o. female with Stage IIA left breast invasive ductal carcinoma, ER+/PR-/HER2-, diagnosed in 11/2015, treated with neoadjuvant chemotherapy, lumpectomy, maintenance Trastuzumab, adjuvant radiation therapy, and anti-estrogen therapy with Tamoxifen that she is not currently taking.  She presents to the Survivorship Clinic for our initial meeting and routine follow-up post-completion of treatment for breast cancer.    1. Stage IIA left breast cancer:  [name] is continuing to recover from definitive treatment for breast cancer. She will follow-up with her medical oncologist, Dr. Jana Hakim next month with history and physical exam per surveillance protocol.  See #2 about her anti-estrogen treatment. Today, a comprehensive survivorship care plan and treatment summary was reviewed with the patient today detailing her breast cancer diagnosis, treatment course, potential late/long-term effects of treatment, appropriate follow-up care with recommendations for the future, and patient education resources.  A copy of this summary, along with a letter will be sent to the patient's  primary care provider via mail/fax/In Basket message after today's visit.    2. Arthralgias, back pain: Due to this the patient has not been able to tolerate any anti estrogens.  She is not taking Tamoxifen.  She is planning on having her ovaries out later this fall.  I ? Whether or not she would be a candidate for Fulvestrant after that?  I encouraged her to review with Dr. Jana Hakim.  She was encouraged to continue to f/u with her rheumatologist.  We reviewed exercise in the ater and that Yoga might be beneficial.    3. Bone health:  Given [name]'s history of breast cancer she is at slight risk for bone demineralization.  She was given education on specific activities  to promote bone health.  4. Cancer screening:  Due to [name]'s history and her age, she should receive screening for skin cancers, colon cancer (when due), and gynecologic cancers.  The information and recommendations are listed on the patient's comprehensive care plan/treatment summary and were reviewed in detail with the patient.    5. Health maintenance and wellness promotion: [name] was encouraged to consume 5-7 servings of fruits and vegetables per day. We reviewed the "Nutrition Rainbow" handout, as well as the handout "Take Control of Your Health and Reduce Your Cancer Risk" from the [place].  She was also encouraged to engage in moderate to vigorous exercise for 30 minutes per day most days of the week. We discussed the [employer] fitness program, which is designed for cancer survivors to help them become more physically fit after cancer treatments.  She was instructed to limit her alcohol consumption and continue to abstain from tobacco use.     6. Support services/counseling: It is not uncommon for this period of the patient's cancer care trajectory to be one of many emotions and stressors.  We discussed an opportunity for her to participate in the next session of [facility] ("Finding Your New  Normal") support group series designed for patients after they have completed treatment.   [name] was encouraged to take advantage of our many other support services programs, support groups, and/or counseling in coping with her new life as a cancer survivor after completing anti-cancer treatment.  She was offered support today through active listening and expressive supportive counseling.  She was given information regarding our available services and encouraged to contact me with any questions or for help enrolling in any of our support group/programs.    Dispo:   -Return to cancer center for follow up with Dr. Jana Hakim on 04/15/2017  -Mammogram due next available -Follow up with surgery 07/2017 -She is welcome to return back to the Survivorship Clinic at any time; no additional follow-up needed at this time.  -Consider referral back to survivorship as a long-term survivor for continued surveillance  A total of (30) minutes of face-to-face time was spent with this patient with greater than 50% of that time in counseling and care-coordination.   Gardenia Phlegm, [place] [phone]   Note: PRIMARY CARE PROVIDER Briscoe Deutscher, [place] [phone]

## 2017-04-01 ENCOUNTER — Telehealth: Payer: Self-pay | Admitting: Adult Health

## 2017-04-01 ENCOUNTER — Encounter: Payer: Self-pay | Admitting: Adult Health

## 2017-04-01 NOTE — Telephone Encounter (Signed)
No 9/25 los

## 2017-04-03 ENCOUNTER — Encounter: Payer: Self-pay | Admitting: Family Medicine

## 2017-04-03 LAB — G-6-PD, QUANT, BLOOD AND RBC: G-6-PD, Quant: 254

## 2017-04-03 LAB — HLA-B27 ANTIGEN: HLA B27: NEGATIVE

## 2017-04-03 LAB — SEDIMENTATION RATE: Sed Rate: 56

## 2017-04-14 ENCOUNTER — Telehealth: Payer: Self-pay | Admitting: Oncology

## 2017-04-14 ENCOUNTER — Encounter: Payer: Self-pay | Admitting: Obstetrics and Gynecology

## 2017-04-14 ENCOUNTER — Ambulatory Visit (INDEPENDENT_AMBULATORY_CARE_PROVIDER_SITE_OTHER): Payer: BLUE CROSS/BLUE SHIELD | Admitting: Obstetrics and Gynecology

## 2017-04-14 VITALS — BP 122/80 | HR 84 | Resp 16 | Wt 206.0 lb

## 2017-04-14 DIAGNOSIS — Z853 Personal history of malignant neoplasm of breast: Secondary | ICD-10-CM

## 2017-04-14 DIAGNOSIS — R102 Pelvic and perineal pain: Secondary | ICD-10-CM

## 2017-04-14 DIAGNOSIS — Z01818 Encounter for other preprocedural examination: Secondary | ICD-10-CM

## 2017-04-14 DIAGNOSIS — Z8742 Personal history of other diseases of the female genital tract: Secondary | ICD-10-CM

## 2017-04-14 NOTE — Progress Notes (Signed)
GYNECOLOGY  VISIT   HPI: 41 y.o.   Married  [place] American  female   G1P1001 with No LMP recorded. Patient has had a hysterectomy.   here for surgery consult. Patient is scheduled for laparoscopy 05-04-17.   She has a h/o breast cancer, s/p lumpectomy, chemo and radiation. Negative BRCA testing.  She has a h/o endometriosis and had 2 laparoscopies prior to her hysterectomy in 2007. At the time of her hysterectomy no endometriosis was seen.  She has cyclic pelvic pain, can be severe. Some cyclic pain with BM, recent normal colonoscopy.  She was on Tamoxifen, went off secondary to side effects. She has a f/u with the Oncologist next week.  She has a h/o fibromyalgia and inflammatory arthritis. She is on prednisone, was on 20 mg x 2 weeks, just switched to 10 mg x 2 week, then will go to 5 mg a day for 2 weeks.   GYNECOLOGIC HISTORY: No LMP recorded. Patient has had a hysterectomy. Contraception:Hysterectomy  Menopausal hormone therapy: none         OB History    Gravida Para Term Preterm AB Living   _0 SAB TAB Ectopic Multiple Live Births                     Patient Active Problem List   Diagnosis Date Noted  . Vitamin D deficiency 02/19/2017  . Fatigue 01/28/2017  . Polyarthralgia 01/28/2017  . Cognitive change 01/28/2017  . Depression 01/28/2017  . Obesity (BMI 30-39.9) 01/28/2017  . Primary insomnia 01/28/2017  . Gastroesophageal reflux disease without esophagitis 01/28/2017  . Genetic testing 01/07/2016  . Breast cancer of upper-outer quadrant of left female breast ([place]) 12/11/2015  . Endometriosis 05/12/2014  . History of hysterectomy for benign disease 05/12/2014    Past Medical History:  Diagnosis Date  . Arthritis   . Breast cancer ([place]) of left breast 11/20/2015  . Depression   . Endometriosis   . Fibromyalgia   . GAD (generalized anxiety disorder)   . GERD (gastroesophageal reflux disease)   . Herniated disc, cervical   . History of cancer  chemotherapy   . History of stomach ulcers   . Hormone disorder   . IBD (inflammatory bowel disease)   . Migraine 01/28/2017  . Migraine without aura   . Panic attacks   . PONV (postoperative nausea and vomiting)   . Seasonal allergies     Past Surgical History:  Procedure Laterality Date  . LAPAROSCOPIC ASSISTED VAGINAL HYSTERECTOMY     Spared ovaries  . LAPAROSCOPY     x 2, diagnosed with endometriosis (prior to hysterectomy), only treated medically.  Marland Kitchen MASTOPEXY Bilateral 05/20/2016   Procedure: MASTOPEXY;  Surgeon: Irene Limbo, MD;  Location: [facility];  Service: Plastics;  Laterality: Bilateral;  . PORTACATH PLACEMENT Right 12/25/2015   Procedure: INSERTION PORT-A-CATH WITH ULTRA SOUND;  Surgeon: [name], MD;  [facility];  Service: General;  Laterality: Right;  . RADIOACTIVE SEED GUIDED MASTECTOMY WITH AXILLARY SENTINEL LYMPH NODE BIOPSY Left 05/12/2016   Procedure: BREAST LUMPECTOMY WITH RADIOACTIVE SEED AND SENTINEL LYMPH NODE BIOPSY AND BLUE DYE INJECTION;  Surgeon: [name], MD;  Location: [facility];  Service: General;  Laterality: Left;    Current Outpatient Prescriptions  Medication Sig Dispense Refill  . hydrochlorothiazide (MICROZIDE) 12.5 MG capsule Take 1 capsule (12.5 mg total) by mouth daily. 30 capsule 3  . omeprazole (  PRILOSEC) 40 MG capsule Take 1 capsule (40 mg total) by mouth daily. 30 MINUTES BEFORE BREAKFAST 30 capsule 11  . predniSONE (DELTASONE) 10 MG tablet TAKE 1 TABLET BY MOUTH DAILY FOR 8 WEEKS  0  . sertraline (ZOLOFT) 100 MG tablet TAKE 1 TABLET BY MOUTH DAILY 30 tablet 3  . traMADol (ULTRAM) 50 MG tablet Take 1 tablet (50 mg total) by mouth every 6 (six) hours as needed. 60 tablet 0  . zolpidem (AMBIEN) 10 MG tablet Take 1/2 to 1 tab po qhs prn 30 tablet 1   No current facility-administered medications for this visit.      ALLERGIES: Amoxicillin and Diflucan [fluconazole]  Family History   Problem Relation Age of Onset  . Sarcoidosis Mother   . Hypertension Father   . Throat cancer Maternal Grandfather        smoker and heavy drinker; dx in his late 37s-50s  . Cancer Paternal Grandmother        possible gastric vs bladder cancer  . Bladder Cancer Paternal Grandmother   . Breast cancer Paternal Aunt        dxin her 79s; dad's maternal half sister  . Breast cancer Other        PGFs mother  . Anesthesia problems Neg Hx   . Hypotension Neg Hx   . Malignant hyperthermia Neg Hx   . Pseudochol deficiency Neg Hx   . Colon cancer Neg Hx   . Stomach cancer Neg Hx   . Rectal cancer Neg Hx   . Esophageal cancer Neg Hx   . Liver cancer Neg Hx     Social History   Social History  . Marital status: Married    Spouse name: N/A  . Number of children: 1  . Years of education: N/A   Occupational History  . Not on file.   Social History Main Topics  . Smoking status: Never Smoker  . Smokeless tobacco: Never Used  . Alcohol use 0.0 oz/week     Comment: socially   . Drug use: No  . Sexual activity: Yes    Partners: Male    Birth control/ protection: Surgical   Other Topics Concern  . Not on file   Social History Narrative  . No narrative on file    Review of Systems  Constitutional:       Weight gain   HENT: Negative.   Eyes: Negative.   Respiratory: Negative.   Cardiovascular: Negative.   Gastrointestinal: Negative.   Genitourinary:       Loss of sexual interest  Left breast pain   Musculoskeletal: Positive for joint pain and myalgias.  Skin: Negative.   Neurological: Negative.   Endo/Heme/Allergies: Negative.   Psychiatric/Behavioral: Negative.     PHYSICAL EXAMINATION:    BP 122/80 (BP Location: Right Arm, Patient Position: Sitting, Cuff Size: Normal)   Pulse 84   Resp 16   Wt 206 lb (93.4 kg)   BMI 35.36 kg/m     General appearance: alert, cooperative and appears stated age Neck: no adenopathy, supple, symmetrical, trachea midline and  thyroid normal to inspection and palpation Heart: regular rate and rhythm Lungs: CTAB Abdomen: soft, non-tender; bowel sounds normal; no masses,  no organomegaly Extremities: normal, atraumatic, no cyanosis Skin: normal color, texture and turgor, no rashes or lesions Lymph: normal cervical supraclavicular and inguinal nodes Neurologic: grossly normal    ASSESSMENT H/O breast cancer Cyclic pelvic pain, h/o endometriosis, prior u/s without evidence of endometriosis.  Perimenopausal  She is on a prednisone taper. Will have been on steroids for over a month at the time of surgery.    PLAN Plan diagnostic laparoscopy, BSO, possible treatment of endometriosis with jplasma, cystoscopy if needed Discussed risks, including but not limited to: infection; bleeding; injury to bowel, bladder, vessel, or ureters; need for laparotomy Discussed 2 week recovery Will treat with a bowel prep Will send home with anaprox and percocet Will have her come in for an am cortisol level around 10/24 to see if she needs stress dose steroids.      An After Visit Summary was printed and given to the patient.   CC: Dr Briscoe Deutscher, Dr Lurline Del

## 2017-04-14 NOTE — Telephone Encounter (Signed)
Had to reschedule appt per patients availability

## 2017-04-15 ENCOUNTER — Telehealth: Payer: Self-pay | Admitting: *Deleted

## 2017-04-15 ENCOUNTER — Other Ambulatory Visit: Payer: BLUE CROSS/BLUE SHIELD

## 2017-04-15 ENCOUNTER — Ambulatory Visit: Payer: BLUE CROSS/BLUE SHIELD | Admitting: Oncology

## 2017-04-15 NOTE — Telephone Encounter (Signed)
Left message to call -eh   [name], MD  Nicholes Rough, LPN    Please let the patient know that we need to check an am cortisol level on her the week prior to surgery to determine if she needs extra steroids around her surgery.  I've placed an order.  Please have her come in on 10/24 at 8 am for a lab visit. She should be fasting and not have taken her steroids for at least 24 hours.  Thanks,  Sharee Pimple

## 2017-04-17 NOTE — Telephone Encounter (Signed)
Patient returning call.

## 2017-04-17 NOTE — H&P (Signed)
GYNECOLOGY  VISIT   HPI: 41 y.o.   Married  [place] American  female   G1P1001 with No LMP recorded. Patient has had a hysterectomy.   here for surgery consult. Patient is scheduled for laparoscopy 05-04-17.   She has a h/o breast cancer, s/p lumpectomy, chemo and radiation. Negative BRCA testing.  She has a h/o endometriosis and had 2 laparoscopies prior to her hysterectomy in 2007. At the time of her hysterectomy no endometriosis was seen.  She has cyclic pelvic pain, can be severe. Some cyclic pain with BM, recent normal colonoscopy.  She was on Tamoxifen, went off secondary to side effects. She has a f/u with the Oncologist next week.  She has a h/o fibromyalgia and inflammatory arthritis. She is on prednisone, was on 20 mg x 2 weeks, just switched to 10 mg x 2 week, then will go to 5 mg a day for 2 weeks.   GYNECOLOGIC HISTORY: No LMP recorded. Patient has had a hysterectomy. Contraception:Hysterectomy  Menopausal hormone therapy: none                 OB History    Gravida Para Term Preterm AB Living   _0 SAB TAB Ectopic Multiple Live Births                         Patient Active Problem List   Diagnosis Date Noted  . Vitamin D deficiency 02/19/2017  . Fatigue 01/28/2017  . Polyarthralgia 01/28/2017  . Cognitive change 01/28/2017  . Depression 01/28/2017  . Obesity (BMI 30-39.9) 01/28/2017  . Primary insomnia 01/28/2017  . Gastroesophageal reflux disease without esophagitis 01/28/2017  . Genetic testing 01/07/2016  . Breast cancer of upper-outer quadrant of left female breast ([place]) 12/11/2015  . Endometriosis 05/12/2014  . History of hysterectomy for benign disease 05/12/2014        Past Medical History:  Diagnosis Date  . Arthritis   . Breast cancer ([place]) of left breast 11/20/2015  . Depression   . Endometriosis   . Fibromyalgia   . GAD (generalized anxiety disorder)   . GERD (gastroesophageal reflux disease)   . Herniated disc,  cervical   . History of cancer chemotherapy   . History of stomach ulcers   . Hormone disorder   . IBD (inflammatory bowel disease)   . Migraine 01/28/2017  . Migraine without aura   . Panic attacks   . PONV (postoperative nausea and vomiting)   . Seasonal allergies          Past Surgical History:  Procedure Laterality Date  . LAPAROSCOPIC ASSISTED VAGINAL HYSTERECTOMY     Spared ovaries  . LAPAROSCOPY     x 2, diagnosed with endometriosis (prior to hysterectomy), only treated medically.  Marland Kitchen MASTOPEXY Bilateral 05/20/2016   Procedure: MASTOPEXY;  Surgeon: Irene Limbo, MD;  Location: [facility];  Service: Plastics;  Laterality: Bilateral;  . PORTACATH PLACEMENT Right 12/25/2015   Procedure: INSERTION PORT-A-CATH WITH ULTRA SOUND;  Surgeon: [name], MD;  Location[facility];  Service: General;  Laterality: Right;  . RADIOACTIVE SEED GUIDED MASTECTOMY WITH AXILLARY SENTINEL LYMPH NODE BIOPSY Left 05/12/2016   Procedure: BREAST LUMPECTOMY WITH RADIOACTIVE SEED AND SENTINEL LYMPH NODE BIOPSY AND BLUE DYE INJECTION;  Surgeon: [name], MD;  Location: [facility];  Service: General;  Laterality: Left;          Current Outpatient Prescriptions  Medication Sig Dispense Refill  . hydrochlorothiazide (MICROZIDE) 12.5 MG capsule Take 1 capsule (12.5 mg total) by mouth daily. 30 capsule 3  . omeprazole (PRILOSEC) 40 MG capsule Take 1 capsule (40 mg total) by mouth daily. 30 MINUTES BEFORE BREAKFAST 30 capsule 11  . predniSONE (DELTASONE) 10 MG tablet TAKE 1 TABLET BY MOUTH DAILY FOR 8 WEEKS  0  . sertraline (ZOLOFT) 100 MG tablet TAKE 1 TABLET BY MOUTH DAILY 30 tablet 3  . traMADol (ULTRAM) 50 MG tablet Take 1 tablet (50 mg total) by mouth every 6 (six) hours as needed. 60 tablet 0  . zolpidem (AMBIEN) 10 MG tablet Take 1/2 to 1 tab po qhs prn 30 tablet 1   No current facility-administered medications for this visit.       ALLERGIES: Amoxicillin and Diflucan [fluconazole]       Family History  Problem Relation Age of Onset  . Sarcoidosis Mother   . Hypertension Father   . Throat cancer Maternal Grandfather        smoker and heavy drinker; dx in his late 79s-50s  . Cancer Paternal Grandmother        possible gastric vs bladder cancer  . Bladder Cancer Paternal Grandmother   . Breast cancer Paternal Aunt        dxin her 19s; dad's maternal half sister  . Breast cancer Other        PGFs mother  . Anesthesia problems Neg Hx   . Hypotension Neg Hx   . Malignant hyperthermia Neg Hx   . Pseudochol deficiency Neg Hx   . Colon cancer Neg Hx   . Stomach cancer Neg Hx   . Rectal cancer Neg Hx   . Esophageal cancer Neg Hx   . Liver cancer Neg Hx     Social History   Social History  . Marital status: Married    Spouse name: N/A  . Number of children: 1  . Years of education: N/A      Occupational History  . Not on file.         Social History Main Topics  . Smoking status: Never Smoker  . Smokeless tobacco: Never Used  . Alcohol use 0.0 oz/week     Comment: socially   . Drug use: No  . Sexual activity: Yes    Partners: Male    Birth control/ protection: Surgical       Other Topics Concern  . Not on file      Social History Narrative  . No narrative on file    Review of Systems  Constitutional:       Weight gain   HENT: Negative.   Eyes: Negative.   Respiratory: Negative.   Cardiovascular: Negative.   Gastrointestinal: Negative.   Genitourinary:       Loss of sexual interest  Left breast pain   Musculoskeletal: Positive for joint pain and myalgias.  Skin: Negative.   Neurological: Negative.   Endo/Heme/Allergies: Negative.   Psychiatric/Behavioral: Negative.     PHYSICAL EXAMINATION:    BP 122/80 (BP Location: Right Arm, Patient Position: Sitting, Cuff Size: Normal)   Pulse 84   Resp 16   Wt 206 lb (93.4 kg)   BMI 35.36  kg/m     General appearance: alert, cooperative and appears stated age Neck: no adenopathy, supple, symmetrical, trachea midline and thyroid normal to inspection and palpation Heart: regular rate and rhythm Lungs: CTAB Abdomen: soft, non-tender; bowel sounds normal; no masses,  no organomegaly Extremities: normal, atraumatic, no cyanosis Skin: normal color, texture and turgor, no rashes or lesions Lymph: normal cervical supraclavicular and inguinal nodes Neurologic: grossly normal    ASSESSMENT H/O breast cancer Cyclic pelvic pain, h/o endometriosis, prior u/s without evidence of endometriosis.  Perimenopausal She is on a prednisone taper. Will have been on steroids for over a month at the time of surgery.    PLAN Plan diagnostic laparoscopy, BSO, possible treatment of endometriosis with jplasma, cystoscopy if needed Discussed risks, including but not limited to: infection; bleeding; injury to bowel, bladder, vessel, or ureters; need for laparotomy Discussed 2 week recovery Will treat with a bowel prep Will send home with anaprox and percocet Will have her come in for an am cortisol level around 10/24 to see if she needs stress dose steroids.

## 2017-04-20 NOTE — Telephone Encounter (Signed)
Left another message to call back- eh

## 2017-04-20 NOTE — Telephone Encounter (Signed)
Spoke with patient and scheduled for labs and gave fasting and steroid instructions

## 2017-04-21 ENCOUNTER — Ambulatory Visit
Admission: RE | Admit: 2017-04-21 | Discharge: 2017-04-21 | Disposition: A | Discharge status: Home / Self Care | Payer: BLUE CROSS/BLUE SHIELD | Source: Ambulatory Visit | Attending: Adult Health | Admitting: Adult Health

## 2017-04-21 ENCOUNTER — Other Ambulatory Visit (HOSPITAL_BASED_OUTPATIENT_CLINIC_OR_DEPARTMENT_OTHER): Payer: BLUE CROSS/BLUE SHIELD

## 2017-04-21 ENCOUNTER — Ambulatory Visit (HOSPITAL_BASED_OUTPATIENT_CLINIC_OR_DEPARTMENT_OTHER): Payer: BLUE CROSS/BLUE SHIELD | Admitting: Oncology

## 2017-04-21 ENCOUNTER — Telehealth: Payer: Self-pay | Admitting: Oncology

## 2017-04-21 ENCOUNTER — Encounter: Payer: Self-pay | Admitting: Pharmacist

## 2017-04-21 VITALS — BP 125/78 | HR 89 | Temp 98.4°F | Resp 20 | Ht 64.0 in | Wt 211.6 lb

## 2017-04-21 DIAGNOSIS — C50412 Malignant neoplasm of upper-outer quadrant of left female breast: Secondary | ICD-10-CM | POA: Diagnosis not present

## 2017-04-21 DIAGNOSIS — Z170 Estrogen receptor positive status [ER+]: Secondary | ICD-10-CM

## 2017-04-21 DIAGNOSIS — Z171 Estrogen receptor negative status [ER-]: Secondary | ICD-10-CM

## 2017-04-21 DIAGNOSIS — M2550 Pain in unspecified joint: Secondary | ICD-10-CM

## 2017-04-21 HISTORY — DX: Personal history of antineoplastic chemotherapy: Z92.21

## 2017-04-21 HISTORY — DX: Personal history of irradiation: Z92.3

## 2017-04-21 LAB — COMPREHENSIVE METABOLIC PANEL
ALT: 12 U/L (ref 0–55)
AST: 11 U/L (ref 5–34)
Albumin: 3.3 g/dL — ABNORMAL LOW (ref 3.5–5.0)
Alkaline Phosphatase: 90 U/L (ref 40–150)
Anion Gap: 9 mEq/L (ref 3–11)
BUN: 12.5 mg/dL (ref 7.0–26.0)
CO2: 27 mEq/L (ref 22–29)
Calcium: 8.9 mg/dL (ref 8.4–10.4)
Chloride: 102 mEq/L (ref 98–109)
Creatinine: 0.8 mg/dL (ref 0.6–1.1)
EGFR: >60 mL/min/{1.73_m2} (ref >60)
Glucose: 118 mg/dl (ref 70–140)
Potassium: 3.1 mEq/L — ABNORMAL LOW (ref 3.5–5.1)
Sodium: 139 mEq/L (ref 136–145)
Total Bilirubin: 0.24 mg/dL (ref 0.20–1.20)
Total Protein: 7 g/dL (ref 6.4–8.3)

## 2017-04-21 LAB — CBC WITH DIFFERENTIAL/PLATELET
BASO%: 0.4 % (ref 0.0–2.0)
Basophils Absolute: 0 10*3/uL (ref 0.0–0.1)
EOS%: 0.9 % (ref 0.0–7.0)
Eosinophils Absolute: 0.1 10*3/uL (ref 0.0–0.5)
HCT: 33.3 % — ABNORMAL LOW (ref 34.8–46.6)
HGB: 11.1 g/dL — ABNORMAL LOW (ref 11.6–15.9)
LYMPH%: 24.1 % (ref 14.0–49.7)
MCH: 29.1 pg (ref 25.1–34.0)
MCHC: 33.4 g/dL (ref 31.5–36.0)
MCV: 87 fL (ref 79.5–101.0)
MONO#: 0.7 10*3/uL (ref 0.1–0.9)
MONO%: 5.9 % (ref 0.0–14.0)
NEUT#: 7.7 10*3/uL — ABNORMAL HIGH (ref 1.5–6.5)
NEUT%: 68.7 % (ref 38.4–76.8)
Platelets: 264 10*3/uL (ref 145–400)
RBC: 3.83 10*6/uL (ref 3.70–5.45)
RDW: 14.2 % (ref 11.2–14.5)
WBC: 11.2 10*3/uL — ABNORMAL HIGH (ref 3.9–10.3)
lymph#: 2.7 10*3/uL (ref 0.9–3.3)

## 2017-04-21 NOTE — Progress Notes (Signed)
Telephone documentation  Study code: [id number]  Spoke with patient over the phone today and informed her that her buccal swab for "Pharmacogenetic analysis of toxicities related to administration of taxanes in breast cancer patients" study did NOT contain enough DNA to yield results. Provide my office number to the patient if she had additional questions.  Alyson N. Leonard, PharmD, BCPS Hematology/Oncology Clinical Pharmacist [facility] [phone]    

## 2017-04-21 NOTE — Telephone Encounter (Signed)
Gave patient avs and calendar with appts per 10/16 los °

## 2017-04-21 NOTE — Progress Notes (Signed)
[facility]  [phone] [phone]     ID: RHYA SHAN DOB: 10/07/1975  MR#: [id number]  GYI#:[id number]  Patient Care Team: Briscoe Deutscher, DO as PCP - General (Family Medicine) Magrinat, Virgie Dad, MD as Consulting Physician (Oncology) [name], MD as Consulting Physician (General Surgery) [name], MD as Consulting Physician (Obstetrics and Gynecology) Delice Bison, Charlestine Massed, NP as Nurse Practitioner (Hematology and Oncology) Kyung Rudd, MD as Consulting Physician (Radiation Oncology) Lahoma Rocker, MD as Referring Physician (Rheumatology) PCP: Briscoe Deutscher, DO OTHER MD:  CHIEF COMPLAINT: HER-2 positive, Estrogen receptor moderately positive breast cancer  CURRENT TREATMENT: Observation  BREAST CANCER HISTORY: From the original intake note:  Rakiya woke up the morning of 11/09/2015 with some pain in her left axilla. She examined herself and found a lump in her left breast. She saw her gynecologist the same day, and she confirms a lump. The patient then proceeded directly to mammography. I do not have that report. However it did show the mass. [name] was then scheduled for ultrasound-guided biopsy 11/21/2015. This showed ([place] [phone] at [employer]) and invasive ductal carcinoma, grade 2 estrogen receptor positive, with moderate intensity (10-50%), progesterone receptor negative, and HER-2 equivocal by immunohistochemistry. Fish was obtained and was equivocal as well, with a signals ratio of 1.8 to, the number per cell being 5.7.  On 12/05/2015 the patient had a CT/ angiogram of the chest for evaluation of her left-sided chest pain. This showed no evidence of a clot. The left breast nodule measured 1.3 cm on the study. There was no enlarged axillary adenopathy. There was also no evidence of lung metastasis or blastic or destructive lytic bone lesions. The upper abdomen was  unremarkable.  With that information the patient presents for further evaluation and treatment.  INTERVAL HISTORY: Tianah returns today for follow-up and treatment of her estrogen receptor positive breast cancer. She stopped tamoxifen after about 2 months because of side affects. When she stopped taking tamoxifen, she no longer had headaches, nausea, and left leg pain. She continued to have joint pain and is now being followed a rheumatologist. She was prescribed prednisone. Pt was told she had inflammatory arthritis and fibromyalgia.   REVIEW OF SYSTEMS: Neima is doing well. She denies unusual headaches, visual changes, nausea, vomiting, or dizziness. There has been no unusual cough, phlegm production, or pleurisy. This been no change in bowel or bladder habits. She denies unexplained fatigue or unexplained weight loss, bleeding, rash, or fever. A detailed review of systems was otherwise entirely negative.    PAST MEDICAL HISTORY: Past Medical History:  Diagnosis Date  . Arthritis   . Breast cancer ([place]) of left breast 11/20/2015  . Depression   . Endometriosis   . Fibromyalgia   . GAD (generalized anxiety disorder)   . GERD (gastroesophageal reflux disease)   . Herniated disc, cervical   . History of cancer chemotherapy   . History of stomach ulcers   . Hormone disorder   . IBD (inflammatory bowel disease)   . Migraine 01/28/2017  . Migraine without aura   . Panic attacks   . PONV (postoperative nausea and vomiting)   . Seasonal allergies     PAST SURGICAL HISTORY: Past Surgical History:  Procedure Laterality Date  . LAPAROSCOPIC ASSISTED VAGINAL HYSTERECTOMY     Spared ovaries  . LAPAROSCOPY     x 2, diagnosed with endometriosis (prior to hysterectomy), only treated medically.  Marland Kitchen MASTOPEXY Bilateral  05/20/2016   Procedure: MASTOPEXY;  Surgeon: Irene Limbo, MD;  Location: [facility];  Service: Plastics;  Laterality: Bilateral;  . PORTACATH PLACEMENT  Right 12/25/2015   Procedure: INSERTION PORT-A-CATH WITH ULTRA SOUND;  Surgeon: [name], MD;  [facility];  Service: General;  Laterality: Right;  . RADIOACTIVE SEED GUIDED MASTECTOMY WITH AXILLARY SENTINEL LYMPH NODE BIOPSY Left 05/12/2016   Procedure: BREAST LUMPECTOMY WITH RADIOACTIVE SEED AND SENTINEL LYMPH NODE BIOPSY AND BLUE DYE INJECTION;  Surgeon: [name], MD;  Location: [facility];  Service: General;  Laterality: Left;    FAMILY HISTORY Family History  Problem Relation Age of Onset  . Sarcoidosis Mother   . Hypertension Father   . Throat cancer Maternal Grandfather        smoker and heavy drinker; dx in his late 43s-50s  . Cancer Paternal Grandmother        possible gastric vs bladder cancer  . Bladder Cancer Paternal Grandmother   . Breast cancer Paternal Aunt        dxin her 72s; dad's maternal half sister  . Breast cancer Other        PGFs mother  . Anesthesia problems Neg Hx   . Hypotension Neg Hx   . Malignant hyperthermia Neg Hx   . Pseudochol deficiency Neg Hx   . Colon cancer Neg Hx   . Stomach cancer Neg Hx   . Rectal cancer Neg Hx   . Esophageal cancer Neg Hx   . Liver cancer Neg Hx    The patient's parents are both living, in their early 32s. The patient has one brother and one sister. On the father's side 1 great grandmother was diagnosed with breast cancer in her 54s. The patient's father's mother was diagnosed with stomach cancer. One of the patient's father's sisters was diagnosed with breast cancer in her 53s. On the maternal side patient's mother's father was diagnosed with throat cancer at age.   GYNECOLOGIC HISTORY:  No LMP recorded. Patient has had a hysterectomy.  menarche age 41, first live birth age 24. The patient is GX P1. She stopped having periods in 2008, when she underwent a simple hysterectomy without salpingo-oophorectomy. She has a history of endometriosis and was started on Provera in March of this  year, with her next dose due next week. (However she has decided to forego further Provera treatments at least for now).   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 Her husband, [name] Prokop just graduated from Sports coach school. He is licensed in [place] but not in [place]. He is now in this area looking for a job. The patient's son [name] is a Ship broker in music at [employer]: Not in place   HEALTH MAINTENANCE: Social History  Substance Use Topics  . Smoking status: Never Smoker  . Smokeless tobacco: Never Used  . Alcohol use 0.0 oz/week     Comment: socially      Colonoscopy:  PAP:  Bone density:  Lipid panel:  Allergies  Allergen Reactions  . Amoxicillin Shortness Of Breath and Itching    Has patient had a PCN reaction causing immediate rash, facial/tongue/throat swelling, SOB or lightheadedness with hypotension: Yes Has patient had a PCN reaction causing severe rash involving mucus membranes or skin necrosis: No Has patient had a PCN reaction that required hospitalization Yes Has patient  had a PCN reaction occurring within the last 10 years: No If all of the above answers are "NO", then may proceed with Cephalosporin use.   Nestor Lewandowsky [Fluconazole] Nausea Only    heartburn    Current Outpatient Prescriptions  Medication Sig Dispense Refill  . hydrochlorothiazide (MICROZIDE) 12.5 MG capsule Take 1 capsule (12.5 mg total) by mouth daily. 30 capsule 3  . omeprazole (PRILOSEC) 40 MG capsule Take 1 capsule (40 mg total) by mouth daily. 30 MINUTES BEFORE BREAKFAST 30 capsule 11  . predniSONE (DELTASONE) 10 MG tablet TAKE 1 TABLET BY MOUTH DAILY FOR 8 WEEKS  0  . sertraline (ZOLOFT) 100 MG tablet TAKE 1 TABLET BY MOUTH DAILY 30 tablet 3  . traMADol (ULTRAM) 50 MG tablet Take 1 tablet (50 mg total) by mouth every 6 (six) hours as needed. 60 tablet 0  .  zolpidem (AMBIEN) 10 MG tablet Take 1/2 to 1 tab po qhs prn 30 tablet 1   No current facility-administered medications for this visit.     OBJECTIVE: Young African-American woman In no acute distress  Vitals:   04/21/17 0958  BP: 125/78  Pulse: 89  Resp: 20  Temp: 98.4 F (36.9 C)  SpO2: 100%     Body mass index is 36.32 kg/m.    ECOG FS:1 - Symptomatic but completely ambulatory Filed Weights   04/21/17 0958  Weight: 211 lb 9.6 oz (96 kg)   Sclerae unicteric, EOMs intact Oropharynx clear and moist No cervical or supraclavicular adenopathy Lungs no rales or rhonchi Heart regular rate and rhythm Abd soft, nontender, positive bowel sounds MSK no focal spinal tenderness, no upper extremity lymphedema Neuro: nonfocal, well oriented, appropriate affect Breasts: Status post bilateral mastopexy's. The left breast is also status post radiation. There is no evidence of local recurrence. Both axillae are benign.   LAB RESULTS:  CMP     Component Value Date/Time   NA 139 04/21/2017 0942   K 3.1 (L) 04/21/2017 0942   CL 103 01/28/2017 1124   CO2 27 04/21/2017 0942   GLUCOSE 118 04/21/2017 0942   BUN 12.5 04/21/2017 0942   CREATININE 0.8 04/21/2017 0942   CALCIUM 8.9 04/21/2017 0942   PROT 7.0 04/21/2017 0942   ALBUMIN 3.3 (L) 04/21/2017 0942   AST 11 04/21/2017 0942   ALT 12 04/21/2017 0942   ALKPHOS 90 04/21/2017 0942   BILITOT 0.24 04/21/2017 0942   GFRNONAA >60 12/04/2015 1858   GFRAA >60 12/04/2015 1858    INo results found for: SPEP, UPEP  Lab Results  Component Value Date   WBC 11.2 (H) 04/21/2017   NEUTROABS 7.7 (H) 04/21/2017   HGB 11.1 (L) 04/21/2017   HCT 33.3 (L) 04/21/2017   MCV 87.0 04/21/2017   PLT 264 04/21/2017      Chemistry      Component Value Date/Time   NA 139 04/21/2017 0942   K 3.1 (L) 04/21/2017 0942   CL 103 01/28/2017 1124   CO2 27 04/21/2017 0942   BUN 12.5 04/21/2017 0942   CREATININE 0.8 04/21/2017 0942      Component Value  Date/Time   CALCIUM 8.9 04/21/2017 0942   ALKPHOS 90 04/21/2017 0942   AST 11 04/21/2017 0942   ALT 12 04/21/2017 0942   BILITOT 0.24 04/21/2017 0942       No results found for: LABCA2  No components found for: [id number]  No results for input(s): INR in the last 168 hours.  Urinalysis    Component  Value Date/Time   COLORURINE STRAW (A) 12/11/2008 1923   APPEARANCEUR CLEAR 12/11/2008 1923   LABSPEC 1.020 12/11/2008 1923   PHURINE 6.0 12/11/2008 1923   GLUCOSEU NEGATIVE 12/11/2008 1923   HGBUR NEGATIVE 12/11/2008 1923   BILIRUBINUR NEGATIVE 12/11/2008 1923   KETONESUR NEGATIVE 12/11/2008 1923   PROTEINUR NEGATIVE 12/11/2008 1923   UROBILINOGEN 0.2 12/11/2008 1923   NITRITE NEGATIVE 12/11/2008 1923   LEUKOCYTESUR  12/11/2008 1923    NEGATIVE MICROSCOPIC NOT DONE ON URINES WITH NEGATIVE PROTEIN, BLOOD, LEUKOCYTES, NITRITE, OR GLUCOSE <1000 mg/dL.      ELIGIBLE FOR AVAILABLE RESEARCH PROTOCOL: no  STUDIES: Pt is scheduled for a Diagnostic Bilateral Breast Mammogram with TOMO, 04/21/2017.  CXR, 10/21/2016, showed no active cardiopulmonary disease.  ASSESSMENT: 41 y.o. [place] woman status post left breast upper outer quadrant biopsy 11/20/2015 for a clinical T1c N0, stage IA invasive ductal carcinoma, grade 2, estrogen receptor moderately positive, progesterone receptor negative, HER-2 equivocal by both immunohistochemistry and FISH  (1) neoadjuvant tamoxifen started 12/11/2015, stopped at the start of chemotherapy  (2) genetics testing 01/03/2016 through the Breast/Ovarian gene panel offered by [facility] found no deleterious mutations in ATM, BARD1, BRCA1, BRCA2, BRIP1, CDH1, CHEK2, EPCAM, FANCC, MLH1, MSH2, MSH6, NBN, PALB2, PMS2, PTEN, RAD51C, RAD51D, TP53, and XRCC2  (3) neoadjuvant chemotherapy consisting of carboplatin, docetaxel, trastuzumab and pertuzumab every 21 days 6 starting 01/01/2016, completed 04/15/2016  (4) trastuzumab to be continued to complete a year  (through 01/09/2017)  (a) echocardiogram 10/28/2016 shows an ejection fraction of 60-65%.   (5) left lumpectomy and sentinel lymph node sampling 05/12/2016 showed a complete pathologic response (ypT0, ypN0)  (a) Status post bilateral mastopexy 05/20/2016  (6) adjuvant radiation from 07/28/2016 to 09/11/2016, left breast 50.4 Gy in 28 fractions, left breast boost 10 Gy in 5 fractions  (7) tamoxifen resumed 09/30/2016, discontinued September 2018, secondary to side effects  PLAN: Yaa is now just about a year out from definitive surgery for her breast cancer with no evidence of disease recurrence. This is favorable.  She has been unable to tolerate tamoxifen. Many of my patients to complain of headaches and cramps in the early weeks, these usually do resolve with further treatment, but we do have other options. Since she will be having her ovaries removed in a couple of weeks, she will become eligible for aromatase inhibitors.  Today we discussed anastrozole in detail and she has a good understanding of the possible toxicities, side effects and complete locations of this agent. The main concern I have of course is that she already has fibromyalgia symptoms and it may be very difficult to differentiate that from the aches and pains that about 20% of patients do experience on anastrozole.  Accordingly I have asked her to contact me the first week in December. She will continue to work with a rheumatologist to bring her arthritis symptoms under control. If they are, we will start her on anastrozole in December and she will return to see me in March.  She does have a good prognosis even without anti-estrogens, but I do think anti-estrogens would drop her risk of recurrence by 3-5%, which is a significant improvement assuming she can tolerate those drugs  She knows to call for any other issues that may develop before her next visit here. Magrinat, Virgie Dad, MD  04/21/17 10:30 AM Medical Oncology and  Hematology [facility] [address] [place], [place] [address] Tel. [phone]    Fax. [phone]  This document serves as  a record of services personally performed by [name], MD. It was created on her behalf by Margit Banda, a trained medical scribe. The creation of this record is based on the scribe's personal observations and the provider's statements to them. This document has been checked and approved by the attending provider.

## 2017-04-22 ENCOUNTER — Other Ambulatory Visit: Payer: Self-pay | Admitting: Oncology

## 2017-04-22 NOTE — Progress Notes (Unsigned)
I was called by [name], [phone]) who did an eye exam on [name] a couple of days ago. She tells me [name]'s optic nerves are a little indistinct. They were not this way last time she was imaged by her, perhaps in May.  [name] has been having some eye pain and some headaches which is reason I referred her to neurology.  I have called her to try to set up a brain MRI as soon as possible.

## 2017-04-22 NOTE — Progress Notes (Unsigned)
[name]  [phone] Fax:([phone]) [phone]     ID: [name] DOB: Oct 05, 1975  MR#: [id number]  SAY#:[id number]  Patient Care Team: Briscoe Deutscher, DO as PCP - General (Family Medicine) Magrinat, Virgie Dad, MD as Consulting Physician (Oncology) [name], MD as Consulting Physician (General Surgery) [name], MD as Consulting Physician (Obstetrics and Gynecology) Delice Bison, Charlestine Massed, NP as Nurse Practitioner (Hematology and Oncology) Kyung Rudd, MD as Consulting Physician (Radiation Oncology) Lahoma Rocker, MD as Referring Physician (Rheumatology) PCP: Briscoe Deutscher, DO OTHER MD:  CHIEF COMPLAINT: [name]-2 positive, Estrogen receptor moderately positive breast cancer  CURRENT TREATMENT: Observation  BREAST CANCER HISTORY: From the original intake note:  [name] woke up the morning of 11/09/2015 with some pain in [name] left axilla. She examined herself and found a lump in [name] left breast. She saw [name] gynecologist the same day, and she confirms a lump. The patient then proceeded directly to mammography. I do not have that report. However it did show the mass. [name] was then scheduled for ultrasound-guided biopsy 11/21/2015. This showed ([place] [phone] at the [facility]) and invasive ductal carcinoma, grade 2 estrogen receptor positive, with moderate intensity (10-50%), progesterone receptor negative, and [name]-2 equivocal by immunohistochemistry. Fish was obtained and was equivocal as well, with a signals ratio of 1.8 to, the number per cell being 5.7.  On 12/05/2015 the patient had a CT/ angiogram of the chest for evaluation of [name] left-sided chest pain. This showed no evidence of a clot. The left breast nodule measured 1.3 cm on the study. There was no enlarged axillary adenopathy. There was also no evidence of lung metastasis or blastic or destructive lytic bone lesions. The upper abdomen was  unremarkable.  With that information the patient presents for further evaluation and treatment.  INTERVAL HISTORY: [name] returns today for follow-up and treatment of [name] estrogen receptor positive breast cancer. She stopped tamoxifen after about 2 months because of side affects. When she stopped taking tamoxifen, she no longer had headaches, nausea, and left leg pain. She continued to have joint pain and is now being followed a rheumatologist. She was prescribed prednisone. [name] was told she had inflammatory arthritis and fibromyalgia.   REVIEW OF SYSTEMS: [name] is doing well. She denies unusual headaches, visual changes, nausea, vomiting, or dizziness. There has been no unusual cough, phlegm production, or pleurisy. This been no change in bowel or bladder habits. She denies unexplained fatigue or unexplained weight loss, bleeding, rash, or fever. A detailed review of systems was otherwise entirely negative.    PAST MEDICAL HISTORY: Past Medical History:  Diagnosis Date  . Arthritis   . Breast cancer ([place]) of left breast 11/20/2015  . Depression   . Endometriosis   . Fibromyalgia   . GAD (generalized anxiety disorder)   . GERD (gastroesophageal reflux disease)   . Herniated disc, cervical   . History of cancer chemotherapy   . History of stomach ulcers   . Hormone disorder   . IBD (inflammatory bowel disease)   . Migraine 01/28/2017  . Migraine without aura   . Panic attacks   . Personal history of chemotherapy   . Personal history of radiation therapy   . PONV (postoperative nausea and vomiting)   . Seasonal allergies     PAST SURGICAL HISTORY: Past Surgical History:  Procedure Laterality Date  . LAPAROSCOPIC ASSISTED VAGINAL HYSTERECTOMY     Spared ovaries  . LAPAROSCOPY  x 2, diagnosed with endometriosis (prior to hysterectomy), only treated medically.  Marland Kitchen MASTOPEXY Bilateral 05/20/2016   Procedure: MASTOPEXY;  Surgeon: Irene Limbo, MD;  Location: [facility];  Service: Plastics;  Laterality: Bilateral;  . PORTACATH PLACEMENT Right 12/25/2015   Procedure: INSERTION PORT-A-CATH WITH ULTRA SOUND;  Surgeon: [name], MD;  Location: [facility];  Service: General;  Laterality: Right;  . RADIOACTIVE SEED GUIDED MASTECTOMY WITH AXILLARY SENTINEL LYMPH NODE BIOPSY Left 05/12/2016   Procedure: BREAST LUMPECTOMY WITH RADIOACTIVE SEED AND SENTINEL LYMPH NODE BIOPSY AND BLUE DYE INJECTION;  Surgeon: [name], MD;  Location: [facility];  Service: General;  Laterality: Left;    FAMILY HISTORY Family History  Problem Relation Age of Onset  . Sarcoidosis Mother   . Hypertension Father   . Throat cancer Maternal Grandfather        smoker and heavy drinker; dx in his late 73s-50s  . Cancer Paternal Grandmother        possible gastric vs bladder cancer  . Bladder Cancer Paternal Grandmother   . Breast cancer Paternal Aunt        dxin [name] 57s; dad's maternal half sister  . Breast cancer Other        PGFs mother  . Anesthesia problems Neg Hx   . Hypotension Neg Hx   . Malignant hyperthermia Neg Hx   . Pseudochol deficiency Neg Hx   . Colon cancer Neg Hx   . Stomach cancer Neg Hx   . Rectal cancer Neg Hx   . Esophageal cancer Neg Hx   . Liver cancer Neg Hx    The patient's parents are both living, in their early 33s. The patient has one brother and one sister. On the father's side 1 great grandmother was diagnosed with breast cancer in [name] 9s. The patient's father's mother was diagnosed with stomach cancer. One of the patient's father's sisters was diagnosed with breast cancer in [name] 72s. On the maternal side patient's mother's father was diagnosed with throat cancer at age.   GYNECOLOGIC HISTORY:  No LMP recorded. Patient has had a hysterectomy.  menarche age 56, first live birth age 63. The patient is GX P1. She stopped having periods in 2008, when she underwent a simple hysterectomy without salpingo-oophorectomy.  She has a history of endometriosis and was started on Provera in March of this year, with [name] next dose due next week. (However she has decided to forego further Provera treatments at least for now).   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 [name] husband, [name] Debes just graduated from Sports coach school. He is licensed in [place] but not in [place]. He is now in this area looking for a job. The patient's son [name] is a Ship broker in music at [employer]: Not in place   HEALTH MAINTENANCE: Social History  Substance Use Topics  . Smoking status: Never Smoker  . Smokeless tobacco: Never Used  . Alcohol use 0.0 oz/week     Comment: socially      Colonoscopy:  PAP:  Bone density:  Lipid panel:  Allergies  Allergen Reactions  . Amoxicillin Shortness Of Breath and Itching    Has patient had a PCN reaction causing immediate rash, facial/tongue/throat swelling, SOB or lightheadedness with hypotension: Yes Has patient had a PCN reaction causing severe rash involving mucus membranes or  skin necrosis: No Has patient had a PCN reaction that required hospitalization Yes Has patient had a PCN reaction occurring within the last 10 years: No If all of the above answers are "NO", then may proceed with Cephalosporin use.   Nestor Lewandowsky [Fluconazole] Nausea Only    heartburn    Current Outpatient Prescriptions  Medication Sig Dispense Refill  . hydrochlorothiazide (MICROZIDE) 12.5 MG capsule Take 1 capsule (12.5 mg total) by mouth daily. 30 capsule 3  . omeprazole (PRILOSEC) 40 MG capsule Take 1 capsule (40 mg total) by mouth daily. 30 MINUTES BEFORE BREAKFAST 30 capsule 11  . predniSONE (DELTASONE) 10 MG tablet TAKE 1 TABLET BY MOUTH DAILY FOR 8 WEEKS  0  . sertraline (ZOLOFT) 100 MG tablet TAKE 1 TABLET BY MOUTH DAILY 30 tablet 3  . traMADol (ULTRAM) 50 MG tablet Take 1  tablet (50 mg total) by mouth every 6 (six) hours as needed. 60 tablet 0  . zolpidem (AMBIEN) 10 MG tablet Take 1/2 to 1 tab po qhs prn 30 tablet 1   No current facility-administered medications for this visit.     OBJECTIVE: Young African-American [name] In no acute distress  There were no vitals filed for this visit.   There is no height or weight on file to calculate BMI.    ECOG FS:1 - Symptomatic but completely ambulatory There were no vitals filed for this visit. Sclerae unicteric, EOMs intact Oropharynx clear and moist No cervical or supraclavicular adenopathy Lungs no rales or rhonchi Heart regular rate and rhythm Abd soft, nontender, positive bowel sounds MSK no focal spinal tenderness, no upper extremity lymphedema Neuro: nonfocal, well oriented, appropriate affect Breasts: Status post bilateral mastopexy's. The left breast is also status post radiation. There is no evidence of local recurrence. Both axillae are benign.   LAB RESULTS:  CMP     Component Value Date/Time   NA 139 04/21/2017 0942   K 3.1 (L) 04/21/2017 0942   CL 103 01/28/2017 1124   CO2 27 04/21/2017 0942   GLUCOSE 118 04/21/2017 0942   BUN 12.5 04/21/2017 0942   CREATININE 0.8 04/21/2017 0942   CALCIUM 8.9 04/21/2017 0942   PROT 7.0 04/21/2017 0942   ALBUMIN 3.3 (L) 04/21/2017 0942   AST 11 04/21/2017 0942   ALT 12 04/21/2017 0942   ALKPHOS 90 04/21/2017 0942   BILITOT 0.24 04/21/2017 0942   GFRNONAA >60 12/04/2015 1858   GFRAA >60 12/04/2015 1858    INo results found for: SPEP, UPEP  Lab Results  Component Value Date   WBC 11.2 (H) 04/21/2017   NEUTROABS 7.7 (H) 04/21/2017   HGB 11.1 (L) 04/21/2017   HCT 33.3 (L) 04/21/2017   MCV 87.0 04/21/2017   PLT 264 04/21/2017      Chemistry      Component Value Date/Time   NA 139 04/21/2017 0942   K 3.1 (L) 04/21/2017 0942   CL 103 01/28/2017 1124   CO2 27 04/21/2017 0942   BUN 12.5 04/21/2017 0942   CREATININE 0.8 04/21/2017 0942        Component Value Date/Time   CALCIUM 8.9 04/21/2017 0942   ALKPHOS 90 04/21/2017 0942   AST 11 04/21/2017 0942   ALT 12 04/21/2017 0942   BILITOT 0.24 04/21/2017 0942       No results found for: LABCA2  No components found for: JSHFW263  No results for input(s): INR in the last 168 hours.  Urinalysis    Component Value Date/Time  COLORURINE STRAW (A) 12/11/2008 1923   APPEARANCEUR CLEAR 12/11/2008 1923   LABSPEC 1.020 12/11/2008 1923   PHURINE 6.0 12/11/2008 1923   GLUCOSEU NEGATIVE 12/11/2008 1923   HGBUR NEGATIVE 12/11/2008 1923   BILIRUBINUR NEGATIVE 12/11/2008 1923   KETONESUR NEGATIVE 12/11/2008 1923   PROTEINUR NEGATIVE 12/11/2008 1923   UROBILINOGEN 0.2 12/11/2008 1923   NITRITE NEGATIVE 12/11/2008 1923   LEUKOCYTESUR  12/11/2008 1923    NEGATIVE MICROSCOPIC NOT DONE ON URINES WITH NEGATIVE PROTEIN, BLOOD, LEUKOCYTES, NITRITE, OR GLUCOSE <1000 mg/dL.      ELIGIBLE FOR AVAILABLE RESEARCH PROTOCOL: no  STUDIES: [name] is scheduled for a Diagnostic Bilateral Breast Mammogram with TOMO, 04/21/2017.  CXR, 10/21/2016, showed no active cardiopulmonary disease.  ASSESSMENT: 41 y.o. [name] status post left breast upper outer quadrant biopsy 11/20/2015 for a clinical T1c N0, stage IA invasive ductal carcinoma, grade 2, estrogen receptor moderately positive, progesterone receptor negative, [name]-2 equivocal by both immunohistochemistry and FISH  (1) neoadjuvant tamoxifen started 12/11/2015, stopped at the start of chemotherapy  (2) genetics testing 01/03/2016 through the Breast/Ovarian gene panel offered by [facility] found no deleterious mutations in ATM, BARD1, BRCA1, BRCA2, BRIP1, CDH1, CHEK2, EPCAM, FANCC, MLH1, MSH2, MSH6, NBN, PALB2, PMS2, PTEN, RAD51C, RAD51D, TP53, and XRCC2  (3) neoadjuvant chemotherapy consisting of carboplatin, docetaxel, trastuzumab and pertuzumab every 21 days 6 starting 01/01/2016, completed 04/15/2016  (4) trastuzumab to be continued to  complete a year (through 01/09/2017)  (a) echocardiogram 10/28/2016 shows an ejection fraction of 60-65%.   (5) left lumpectomy and sentinel lymph node sampling 05/12/2016 showed a complete pathologic response (ypT0, ypN0)  (a) Status post bilateral mastopexy 05/20/2016  (6) adjuvant radiation from 07/28/2016 to 09/11/2016, left breast 50.4 Gy in 28 fractions, left breast boost 10 Gy in 5 fractions  (7) tamoxifen resumed 09/30/2016, discontinued September 2018, secondary to side effects  PLAN: Laparis is now just about a year out from definitive surgery for [name] breast cancer with no evidence of disease recurrence. This is favorable.  She has been unable to tolerate tamoxifen. Many of my patients to complain of headaches and cramps in the early weeks, these usually do resolve with further treatment, but we do have other options. Since she will be having [name] ovaries removed in a couple of weeks, she will become eligible for aromatase inhibitors.  Today we discussed anastrozole in detail and she has a good understanding of the possible toxicities, side effects and complete locations of this agent. The main concern I have of course is that she already has fibromyalgia symptoms and it may be very difficult to differentiate that from the aches and pains that about 20% of patients do experience on anastrozole.  Accordingly I have asked [name] to contact me the first week in December. She will continue to work with a rheumatologist to bring [name] arthritis symptoms under control. If they are, we will start [name] on anastrozole in December and she will return to see me in March.  She does have a good prognosis even without anti-estrogens, but I do think anti-estrogens would drop [name] risk of recurrence by 3-5%, which is a significant improvement assuming she can tolerate those drugs  She knows to call for any other issues that may develop before [name] next visit here. Magrinat, Virgie Dad, MD  04/22/17 10:54  AM Medical Oncology and Hematology [facility] [address] [place], [place] [address] Tel. [phone]    Fax. [phone]  This document serves as a record of services  personally performed by [name], MD. It was created on [name] behalf by Margit Banda, a trained medical scribe. The creation of this record is based on the scribe's personal observations and the provider's statements to them. This document has been checked and approved by the attending provider.

## 2017-04-23 ENCOUNTER — Other Ambulatory Visit: Payer: Self-pay | Admitting: Oncology

## 2017-04-23 ENCOUNTER — Ambulatory Visit (HOSPITAL_COMMUNITY): Admission: RE | Admit: 2017-04-23 | Payer: BLUE CROSS/BLUE SHIELD | Source: Ambulatory Visit

## 2017-04-23 ENCOUNTER — Telehealth: Payer: Self-pay

## 2017-04-23 DIAGNOSIS — R51 Headache: Secondary | ICD-10-CM

## 2017-04-23 DIAGNOSIS — C50412 Malignant neoplasm of upper-outer quadrant of left female breast: Secondary | ICD-10-CM

## 2017-04-23 DIAGNOSIS — Z170 Estrogen receptor positive status [ER+]: Secondary | ICD-10-CM

## 2017-04-23 DIAGNOSIS — R519 Headache, unspecified: Secondary | ICD-10-CM

## 2017-04-23 MED ORDER — ACETAZOLAMIDE 125 MG PO TABS: 125.0000 mg | ORAL_TABLET | Freq: Two times a day (BID) | ORAL | 0 refills | Status: DC

## 2017-04-23 NOTE — Progress Notes (Signed)
I am updating my prior note on [name]: Recall I was called by her eye doctor yesterday to report papilledema. [name] has been having some headaches and she still having headaches. I suggested she go to the emergency room at [facility] since she is in [place] at present. She was evaluated there initially with a CT and then she initially told me they could not do an MRI so I'll set her up for an MRI this morning at [facility]. However when I called her this morning she told me around midnight last night she did have an MRI at [facility]. She was told that there is no evidence of bleed no evidence of cancer and that it was essentially an unremarkable exam. Note that I do not have access to this exam.  I am going to call her in acetazolamide low dose to see if a few doses helps the headache. I will also see if her neurologist (she has an appointment October 30) could possibly evaluate her sooner.  She will call she has any other issues relating to this problem.

## 2017-04-23 NOTE — Telephone Encounter (Signed)
LVM at [facility] Neurology requesting an appt for [name] sooner that 10/30 if they possibly have any open spot to see her next week per Dr Virgie Dad request

## 2017-04-24 ENCOUNTER — Encounter (HOSPITAL_BASED_OUTPATIENT_CLINIC_OR_DEPARTMENT_OTHER): Payer: Self-pay | Admitting: *Deleted

## 2017-04-24 NOTE — Telephone Encounter (Signed)
No entry 

## 2017-04-27 ENCOUNTER — Encounter (HOSPITAL_BASED_OUTPATIENT_CLINIC_OR_DEPARTMENT_OTHER): Payer: Self-pay | Admitting: *Deleted

## 2017-04-27 NOTE — Progress Notes (Signed)
NPO AFTER MN.  ARRIVE AT 0530.  CURRENT LAB RESULTS IN CHART AND EPIC.  NEEDS LOVENOX [place] ON ARRIVAL.  WILL TAKE AM MEDS DOS W/ SIPS OF WATER WITH EXCEPTION NO HCTZ.

## 2017-04-28 ENCOUNTER — Other Ambulatory Visit: Payer: Self-pay

## 2017-04-28 ENCOUNTER — Encounter: Payer: Self-pay | Admitting: Family Medicine

## 2017-04-28 ENCOUNTER — Ambulatory Visit (INDEPENDENT_AMBULATORY_CARE_PROVIDER_SITE_OTHER): Payer: BLUE CROSS/BLUE SHIELD | Admitting: Family Medicine

## 2017-04-28 VITALS — BP 120/74 | HR 91 | Temp 98.0°F | Ht 64.0 in | Wt 211.2 lb

## 2017-04-28 DIAGNOSIS — R5382 Chronic fatigue, unspecified: Secondary | ICD-10-CM | POA: Diagnosis not present

## 2017-04-28 DIAGNOSIS — R519 Headache, unspecified: Secondary | ICD-10-CM

## 2017-04-28 DIAGNOSIS — R51 Headache: Secondary | ICD-10-CM

## 2017-04-28 NOTE — Progress Notes (Signed)
[name] is a 41 y.o. female is here for follow up.  History of Present Illness:   [name] CMA acting as scribe for Dr. Juleen [name].  HPI: Patient comes in today for follow up. She stated that she was in the ED last week and they did an MRI. They did not find anything that was causing the pressure behind the eyes. She stated that her oncologist prescribed HCTZ and this has helped. She also has some bruising on her arm that she is concerned about.   Health Maintenance Due  Topic Date Due  . HIV Screening  11/18/1988  . TETANUS/TDAP  11/18/1992   No flowsheet data found.   PMHx, SurgHx, SocialHx, FamHx, Medications, and Allergies were reviewed in the Visit Navigator and updated as appropriate.   Patient Active Problem List   Diagnosis Date Noted  . Vitamin D deficiency 02/19/2017  . Fatigue 01/28/2017  . Polyarthralgia 01/28/2017  . Cognitive change 01/28/2017  . Depression 01/28/2017  . Obesity (BMI 30-39.9) 01/28/2017  . Primary insomnia 01/28/2017  . Gastroesophageal reflux disease without esophagitis 01/28/2017  . Malignant neoplasm of upper-outer quadrant of left breast in female, estrogen receptor positive ([name]) 07/29/2016  . Genetic testing 01/07/2016  . Breast cancer of upper-outer quadrant of left female breast ([name]) 12/11/2015  . Endometriosis 05/12/2014  . History of hysterectomy for benign disease 05/12/2014   Social History  Substance Use Topics  . Smoking status: Never Smoker  . Smokeless tobacco: Never Used  . Alcohol use 0.0 oz/week     Comment: socially    Current Medications and Allergies:   .  hydrochlorothiazide (MICROZIDE) 12.5 MG capsule, Take 1 capsule (12.5 mg total) by mouth daily. (Patient taking differently: Take 12.5 mg by mouth 2 (two) times daily. ), Disp: 30 capsule, Rfl: 3 .  omeprazole (PRILOSEC) 40 MG capsule, Take 1 capsule (40 mg total) by mouth daily. [place] (Patient taking differently: Take 40 mg by  mouth every morning. [place]), Disp: 30 capsule, Rfl: 11 .  predniSONE (DELTASONE) 10 MG tablet, TAKE 1 TABLET BY MOUTH DAILY FOR 8 WEEKS--- CURRENTLY PER PT IS TAKING HALF TABLET=5MG --- TAKES IN AM, Disp: , Rfl: 0 .  sertraline (ZOLOFT) 100 MG tablet, TAKE 1 TABLET BY MOUTH DAILY (Patient taking differently: TAKE 1 TABLET BY MOUTH DAILY--- TAKES IN AM), Disp: 30 tablet, Rfl: 3 .  traMADol (ULTRAM) 50 MG tablet, Take 1 tablet (50 mg total) by mouth every 6 (six) hours as needed., Disp: 60 tablet, Rfl: 0 .  zolpidem (AMBIEN) 10 MG tablet, Take 1/2 to 1 tab po qhs prn (Patient taking differently: Take 10 mg by mouth at bedtime as needed. Take 1/2 to 1 tab po qhs prn), Disp: 30 tablet, Rfl: 1  Allergies  Allergen Reactions  . Amoxicillin Shortness Of Breath and Itching    Has patient had a PCN reaction causing immediate rash, facial/tongue/throat swelling, SOB or lightheadedness with hypotension: Yes Has patient had a PCN reaction causing severe rash involving mucus membranes or skin necrosis: No Has patient had a PCN reaction that required hospitalization Yes Has patient had a PCN reaction occurring within the last 10 years: No If all of the above answers are "NO", then may proceed with Cephalosporin use.   . Diflucan [Fluconazole] Nausea Only    heartburn   Review of Systems   Pertinent items are noted in the HPI. Otherwise, ROS is negative.  Vitals:   Vitals:  04/28/17 1432  BP: 120/74  Pulse: 91  Temp: 98 F (36.7 C)  TempSrc: Oral  Weight: 211 lb 3.2 oz (95.8 kg)  Height: 5\' 4"  (1.626 m)     Body mass index is 36.25 kg/m.   Physical Exam:   Physical Exam  Constitutional: She appears well-nourished.  HENT:  Head: Normocephalic and atraumatic.  Eyes: Pupils are equal, round, and reactive to light. EOM are normal.  Neck: Normal range of motion. Neck supple.  Cardiovascular: Normal rate, regular rhythm, normal heart sounds and intact distal pulses.     Pulmonary/Chest: Effort normal.  Abdominal: Soft.  Skin: Skin is warm.  Bruise to right upper arm (injection site).  Psychiatric: She has a normal mood and affect. Her behavior is normal.  Nursing note and vitals reviewed.   Assessment and Plan:   [name] was seen today for follow-up.  Diagnoses and all orders for this visit:  Chronic fatigue Comments: See below. Reviewed healthy lifestyle choices. Orders: -     Split night study; Future  Morning headache Comments: Upcoming appointment with Neurology for HA, set up by Oncology. Recent MRI was negative. Will go ahead and order sleep study. Orders: -     Split night study; Future   . Reviewed expectations re: course of current medical issues. . Discussed self-management of symptoms. . Outlined signs and symptoms indicating need for more acute intervention. . Patient verbalized understanding and all questions were answered. Marland Kitchen Health Maintenance issues including appropriate healthy diet, exercise, and smoking avoidance were discussed with patient. . See orders for this visit as documented in the electronic medical record. . Patient received an After Visit Summary.  CMA served as Education administrator during this visit. History, Physical, and Plan performed by medical provider. The above documentation has been reviewed and is accurate and complete. Briscoe Deutscher, D.O.  Briscoe Deutscher, DO [place], [place] 04/29/2017  Future Appointments Date Time Provider [place]  05/05/2017 9:00 AM [name], PsyD [facility] None  05/12/2017 10:30 AM [facility]- NEUROPSY[facility] TECH [facility] None  05/19/2017 1:00 PM [name], PsyD [facility] None  05/19/2017 3:15 PM [name], MD [place] None  06/23/2017 9:30 AM [facility] LAB 1 [facility] None  06/23/2017 10:00 AM Magrinat, Virgie Dad, MD [facility] None  07/29/2017 1:30 PM Briscoe Deutscher, DO [facility] None  03/03/2018 1:15 PM [name], MD [place] None

## 2017-04-29 ENCOUNTER — Other Ambulatory Visit: Payer: Self-pay | Admitting: Obstetrics and Gynecology

## 2017-04-29 ENCOUNTER — Other Ambulatory Visit (INDEPENDENT_AMBULATORY_CARE_PROVIDER_SITE_OTHER): Payer: BLUE CROSS/BLUE SHIELD

## 2017-04-29 ENCOUNTER — Other Ambulatory Visit: Payer: Self-pay

## 2017-04-29 DIAGNOSIS — R238 Other skin changes: Secondary | ICD-10-CM

## 2017-04-29 DIAGNOSIS — R233 Spontaneous ecchymoses: Secondary | ICD-10-CM

## 2017-04-29 DIAGNOSIS — K068 Other specified disorders of gingiva and edentulous alveolar ridge: Secondary | ICD-10-CM

## 2017-04-29 DIAGNOSIS — Z01818 Encounter for other preprocedural examination: Secondary | ICD-10-CM

## 2017-04-29 NOTE — Progress Notes (Signed)
The patient came in to have a cortisol level checked and is reporting easy bruising and bleeding from her gums.  She had a large bruise on her upper arm from a recent flu shot.  She has a recent normal LFT's and creatinine Will check PT/INR, PTT, CBC with differential. Trying to order peripheral blood smear as well.  Will get in touch with Dr Juleen China

## 2017-04-30 ENCOUNTER — Telehealth: Payer: Self-pay | Admitting: Family Medicine

## 2017-04-30 ENCOUNTER — Ambulatory Visit: Payer: BLUE CROSS/BLUE SHIELD | Admitting: Family Medicine

## 2017-04-30 LAB — CBC WITH DIFFERENTIAL/PLATELET
Basophils Absolute: 0 10*3/uL (ref 0.0–0.2)
Basos: 0 %
EOS (ABSOLUTE): 0.2 10*3/uL (ref 0.0–0.4)
Eos: 2 %
Hematocrit: 36 % (ref 34.0–46.6)
Hemoglobin: 11.6 g/dL (ref 11.1–15.9)
Immature Grans (Abs): 0 10*3/uL (ref 0.0–0.1)
Immature Granulocytes: 0 %
Lymphocytes Absolute: 2 10*3/uL (ref 0.7–3.1)
Lymphs: 21 %
MCH: 28.9 pg (ref 26.6–33.0)
MCHC: 32.2 g/dL (ref 31.5–35.7)
MCV: 90 fL (ref 79–97)
Monocytes Absolute: 0.8 10*3/uL (ref 0.1–0.9)
Monocytes: 8 %
Neutrophils Absolute: 6.2 10*3/uL (ref 1.4–7.0)
Neutrophils: 69 %
Platelets: 303 10*3/uL (ref 150–379)
RBC: 4.01 x10E6/uL (ref 3.77–5.28)
RDW: 15.1 % (ref 12.3–15.4)
WBC: 9.2 10*3/uL (ref 3.4–10.8)

## 2017-04-30 LAB — PROTIME-INR
INR: 1 (ref 0.8–1.2)
Prothrombin Time: 10.4 s (ref 9.1–12.0)

## 2017-04-30 LAB — APTT: aPTT: 29 s (ref 24–33)

## 2017-04-30 LAB — CORTISOL-AM, BLOOD: Cortisol - AM: 11.5 ug/dL (ref 6.2–19.4)

## 2017-04-30 NOTE — Telephone Encounter (Signed)
Patient calling to check on status of surgery clearance. It's scheduled for this Monday 10/29.  See previous note from [place].  Ty,  -LL

## 2017-04-30 NOTE — Progress Notes (Signed)
04/30/2017   Labs reviewed. No concerns. Patient optimized for surgery.  [name]

## 2017-04-30 NOTE — Telephone Encounter (Signed)
Patient's mother calling to check on status of notes below. Patient's mother stated without Dr. Alcario Drought recommendation to reschedule surgery, patient will be charged $200.00. Patient is having "anxiety attacks" due to being uncomfortable going forward with surgery until seeing urologist. Patient's mother stated patient still has "pressure behind eyes" and "platelets are low". Please advise.

## 2017-04-30 NOTE — Telephone Encounter (Signed)
error 

## 2017-04-30 NOTE — Telephone Encounter (Signed)
Patient is scheduled to have surgery on Monday. Dr. Talbert Nan states that patient seems to have concerns about bruising. Dr. Talbert Nan would like to discuss this with you if possible. Please call.

## 2017-04-30 NOTE — Progress Notes (Signed)
She is optimized for surgery. No concerns on my end. Thanks!

## 2017-05-01 ENCOUNTER — Telehealth: Payer: Self-pay | Admitting: Family Medicine

## 2017-05-01 ENCOUNTER — Telehealth: Payer: Self-pay | Admitting: Obstetrics and Gynecology

## 2017-05-01 NOTE — Telephone Encounter (Signed)
Left Message for patient to return call  

## 2017-05-01 NOTE — Telephone Encounter (Signed)
Call to patient. Reports she is not comfortable proceeding on Monday due to other medical issues. Migraines, dizzy spells, pressure behind eyes and "passed out."  Scheduled to see neurology next week. Concerned about cancel fee. Advised will not be charged this since cancelled due to medical concerns. Patient will call back when ready to reschedule.  Patient speaking with PCP reagarding these symptoms.   Surgery case canceled with hospital Dr Talbert Nan notified.   Routing to provider for final review. Patient agreeable to disposition. Will close encounter.

## 2017-05-01 NOTE — Telephone Encounter (Signed)
Please advise 

## 2017-05-01 NOTE — Telephone Encounter (Signed)
Patient called asking to speak with you regarding a surgery that she has scheduled on Monday. She said that she is wanting to cancel the surgery but wanted to discuss it with either the assistant or Dr Juleen China. Please advise.

## 2017-05-01 NOTE — Telephone Encounter (Signed)
Patient calling to state she has cancelled surgery that was scheduled. Stated she wanted to pass that along, and asked for a return phone call when possible.

## 2017-05-01 NOTE — Telephone Encounter (Signed)
Labs are normal. Both Dr. Talbert Nan and I are "go" for surgery. If she is having panic attacks, we can talk.

## 2017-05-01 NOTE — Telephone Encounter (Signed)
Scheduled patient to come in to discuss surgery on November 1st.

## 2017-05-01 NOTE — Telephone Encounter (Signed)
Patient has questions about her surgery on Monday.

## 2017-05-01 NOTE — Telephone Encounter (Signed)
Patient is scheduled to come in to see you next week.

## 2017-05-04 ENCOUNTER — Ambulatory Visit (HOSPITAL_BASED_OUTPATIENT_CLINIC_OR_DEPARTMENT_OTHER)
Admission: RE | Admit: 2017-05-04 | Payer: BLUE CROSS/BLUE SHIELD | Source: Ambulatory Visit | Admitting: Obstetrics and Gynecology

## 2017-05-04 HISTORY — DX: Personal history of other diseases of the digestive system: Z87.19

## 2017-05-04 HISTORY — DX: Unspecified hemorrhoids: K64.9

## 2017-05-04 HISTORY — DX: Personal history of antineoplastic chemotherapy: Z92.21

## 2017-05-04 HISTORY — DX: Personal history of irradiation: Z92.3

## 2017-05-04 HISTORY — DX: Presence of spectacles and contact lenses: Z97.3

## 2017-05-04 HISTORY — DX: Personal history of other mental and behavioral disorders: Z86.59

## 2017-05-04 HISTORY — DX: Pelvic and perineal pain: R10.2

## 2017-05-04 HISTORY — DX: Unspecified osteoarthritis, unspecified site: M19.90

## 2017-05-04 HISTORY — DX: Localized edema: R60.0

## 2017-05-04 HISTORY — DX: Major depressive disorder, single episode, unspecified: F32.9

## 2017-05-04 HISTORY — DX: Malignant neoplasm of upper-outer quadrant of left female breast: C50.412

## 2017-05-04 HISTORY — DX: Personal history of other diseases of the female genital tract: Z87.42

## 2017-05-04 HISTORY — DX: Dyspnea, unspecified: R06.00

## 2017-05-04 HISTORY — DX: Other specified arthritis, unspecified site: M13.80

## 2017-05-04 HISTORY — DX: Pelvic and perineal pain unspecified side: R10.20

## 2017-05-04 SURGERY — LAPAROSCOPY, DIAGNOSTIC
Anesthesia: General
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
## 2017-05-05 ENCOUNTER — Encounter: Payer: Self-pay | Admitting: Psychology

## 2017-05-05 ENCOUNTER — Ambulatory Visit (INDEPENDENT_AMBULATORY_CARE_PROVIDER_SITE_OTHER): Payer: BLUE CROSS/BLUE SHIELD | Admitting: Psychology

## 2017-05-05 DIAGNOSIS — F4321 Adjustment disorder with depressed mood: Secondary | ICD-10-CM

## 2017-05-05 DIAGNOSIS — R413 Other amnesia: Secondary | ICD-10-CM

## 2017-05-05 DIAGNOSIS — F419 Anxiety disorder, unspecified: Secondary | ICD-10-CM

## 2017-05-07 ENCOUNTER — Ambulatory Visit: Payer: BLUE CROSS/BLUE SHIELD | Admitting: Family Medicine

## 2017-05-07 ENCOUNTER — Telehealth: Payer: Self-pay | Admitting: Family Medicine

## 2017-05-07 DIAGNOSIS — G479 Sleep disorder, unspecified: Secondary | ICD-10-CM

## 2017-05-07 NOTE — Telephone Encounter (Signed)
[employer] is reqesting a peer to peer in 24hours RE sleep study.  Dr. Juleen China or CMA needs to call within 24 hours.  Will need patient subscriber #.  [employer] [phone]   Please call Elvina Sidle back after speaking to [facility] (# noted in contact field)  Ty,  -LL

## 2017-05-07 NOTE — Telephone Encounter (Signed)
Forwarding

## 2017-05-08 ENCOUNTER — Telehealth: Payer: Self-pay | Admitting: Obstetrics and Gynecology

## 2017-05-08 NOTE — Telephone Encounter (Signed)
Patient calling in reference to notes below. Please call patient and advise on status of sleep study.

## 2017-05-08 NOTE — Telephone Encounter (Signed)
Patient returned Dr. Gentry Fitz call from earlier today. Please see closed telephone note from 05/01/17 for reference.

## 2017-05-08 NOTE — Telephone Encounter (Signed)
Called to check on the patient. Left a message that I was checking on her and to call back.

## 2017-05-12 ENCOUNTER — Ambulatory Visit (INDEPENDENT_AMBULATORY_CARE_PROVIDER_SITE_OTHER): Payer: BLUE CROSS/BLUE SHIELD | Admitting: Psychology

## 2017-05-12 ENCOUNTER — Encounter: Payer: Self-pay | Admitting: Psychology

## 2017-05-12 ENCOUNTER — Encounter: Payer: Self-pay | Admitting: Family Medicine

## 2017-05-12 ENCOUNTER — Ambulatory Visit (INDEPENDENT_AMBULATORY_CARE_PROVIDER_SITE_OTHER): Payer: BLUE CROSS/BLUE SHIELD | Admitting: Family Medicine

## 2017-05-12 VITALS — BP 116/76 | HR 90 | Temp 98.1°F | Ht 64.0 in | Wt 212.0 lb

## 2017-05-12 DIAGNOSIS — C50412 Malignant neoplasm of upper-outer quadrant of left female breast: Secondary | ICD-10-CM

## 2017-05-12 DIAGNOSIS — E669 Obesity, unspecified: Secondary | ICD-10-CM | POA: Diagnosis not present

## 2017-05-12 DIAGNOSIS — Z170 Estrogen receptor positive status [ER+]: Secondary | ICD-10-CM

## 2017-05-12 DIAGNOSIS — R4189 Other symptoms and signs involving cognitive functions and awareness: Secondary | ICD-10-CM | POA: Diagnosis not present

## 2017-05-12 DIAGNOSIS — F5102 Adjustment insomnia: Secondary | ICD-10-CM

## 2017-05-12 DIAGNOSIS — R413 Other amnesia: Secondary | ICD-10-CM

## 2017-05-12 DIAGNOSIS — G479 Sleep disorder, unspecified: Secondary | ICD-10-CM

## 2017-05-12 MED ORDER — LORAZEPAM 1 MG PO TABS: 1.0000 mg | ORAL_TABLET | Freq: Every day | ORAL | 1 refills | Status: DC

## 2017-05-12 NOTE — Telephone Encounter (Signed)
Sending to Sleep instead.

## 2017-05-12 NOTE — Telephone Encounter (Signed)
Patient came for an appointment.

## 2017-05-12 NOTE — Telephone Encounter (Signed)
FYI

## 2017-05-12 NOTE — Progress Notes (Signed)
   Neuropsychology Note  [name] returned today for 2 hours of neuropsychological testing with technician, [name], BS, under the supervision of Dr. Macarthur Critchley. The patient did not appear overtly distressed by the testing session, per behavioral observation or via self-report to the technician. Rest breaks were offered. [name] will return within 2 weeks for a feedback session with Dr. Si Raider at which time her test performances, clinical impressions and treatment recommendations will be reviewed in detail. The patient understands she can contact our office should she require our assistance before this time.  Full report to follow.

## 2017-05-12 NOTE — Telephone Encounter (Signed)
Per [employer], ordering physician must call [employer] for peer to peer. Attempted to call [employer] for the second time, unable to move forward without Peer to Peer.

## 2017-05-12 NOTE — Progress Notes (Signed)
[name] is a 41 y.o. female is here for follow up.  History of Present Illness:   HPI:   1. Adjustment insomnia. Patient complains of frequent night time awakening and difficulty falling asleep for the past 6 months.  Associated symptoms include anxiety and daytime somnolence. Average duration of sleep 3-4 hours/night with 1-2 awakenings/night. The patient has been taking: Ambien. Side effects from the medication: none. A sleep study was ordered but has been difficult to get covered by insurance, so sending to a Sleep Specialist.    2. Obesity (BMI 30-39.9).   Wt Readings from Last 3 Encounters:  05/12/17 212 lb (96.2 kg)  04/28/17 211 lb 3.2 oz (95.8 kg)  04/21/17 211 lb 9.6 oz (96 kg)    3. Malignant neoplasm of upper-outer quadrant of left breast in female, estrogen receptor positive ([place]). Followed by Oncology. Last note reviewed.    4. Cognitive change. Evaluation today. Waiting on results.    5. Sleep disorder. As above.    Health Maintenance Due  Topic Date Due  . HIV Screening  11/18/1988  . TETANUS/TDAP  11/18/1992   No flowsheet data found. PMHx, SurgHx, SocialHx, FamHx, Medications, and Allergies were reviewed in the Visit Navigator and updated as appropriate.   Patient Active Problem List   Diagnosis Date Noted  . Vitamin D deficiency 02/19/2017  . Fatigue 01/28/2017  . Polyarthralgia 01/28/2017  . Cognitive change 01/28/2017  . Depression 01/28/2017  . Obesity (BMI 30-39.9) 01/28/2017  . Primary insomnia 01/28/2017  . Gastroesophageal reflux disease without esophagitis 01/28/2017  . Malignant neoplasm of upper-outer quadrant of left breast in female, estrogen receptor positive ([place]) 07/29/2016  . Genetic testing 01/07/2016  . Breast cancer of upper-outer quadrant of left female breast ([place]) 12/11/2015  . Endometriosis 05/12/2014  . History of hysterectomy for benign disease 05/12/2014   Social History   Tobacco Use  . Smoking status: Never  Smoker  . Smokeless tobacco: Never Used  Substance Use Topics  . Alcohol use: Yes    Alcohol/week: 0.0 oz    Comment: socially   . Drug use: No   Current Medications and Allergies:   .  hydrochlorothiazide (MICROZIDE) 12.5 MG capsule, Take 1 capsule (12.5 mg total) by mouth daily. (Patient taking differently: Take 12.5 mg by mouth 2 (two) times daily. ), Disp: 30 capsule, Rfl: 3 .  omeprazole (PRILOSEC) 40 MG capsule, Take 1 capsule (40 mg total) by mouth daily. [place] (Patient taking differently: Take 40 mg by mouth every morning. [place]), Disp: 30 capsule, Rfl: 11 .  predniSONE (DELTASONE) 10 MG tablet, TAKE 1 TABLET BY MOUTH DAILY FOR 8 WEEKS--- CURRENTLY PER PT IS TAKING HALF TABLET = 5MG --- TAKES IN AM, Disp: , Rfl: 0 .  sertraline (ZOLOFT) 100 MG tablet, TAKE 1 TABLET BY MOUTH DAILY (Patient taking differently: TAKE 1 TABLET BY MOUTH DAILY--- TAKES IN AM), Disp: 30 tablet, Rfl: 3 .  traMADol (ULTRAM) 50 MG tablet, Take 1 tablet (50 mg total) by mouth every 6 (six) hours as needed., Disp: 60 tablet, Rfl: 0 .  zolpidem (AMBIEN) 10 MG tablet, Take 1/2 to 1 tab po qhs prn (Patient taking differently: Take 10 mg by mouth at bedtime as needed. Take 1/2 to 1 tab po qhs prn), Disp: 30 tablet, Rfl: 1  Allergies  Allergen Reactions  . Amoxicillin Shortness Of Breath and Itching  . Diflucan [Fluconazole] Nausea Only    Heartburn   Review of  Systems   Pertinent items are noted in the HPI. Otherwise, ROS is negative.  Vitals:   Vitals:   05/12/17 1448  BP: 116/76  Pulse: 90  Temp: 98.1 F (36.7 C)  TempSrc: Oral  SpO2: 96%  Weight: 212 lb (96.2 kg)  Height: 5\' 4"  (1.626 m)     Body mass index is 36.39 kg/m.   Physical Exam:   Physical Exam  Constitutional: She appears well-nourished.  HENT:  Head: Normocephalic and atraumatic.  Eyes: EOM are normal. Pupils are equal, round, and reactive to light.  Neck: Normal range of motion. Neck  supple.  Cardiovascular: Normal rate, regular rhythm, normal heart sounds and intact distal pulses.  Pulmonary/Chest: Effort normal.  Abdominal: Soft.  Skin: Skin is warm.  Psychiatric: She has a normal mood and affect. Her behavior is normal.  Nursing note and vitals reviewed.   Results for orders placed or performed in visit on 04/29/17  Cortisol-am, blood  Result Value Ref Range   Cortisol - AM 11.5 6.2 - 19.4 ug/dL  INR/PT  Result Value Ref Range   INR 1.0 0.8 - 1.2   Prothrombin Time 10.4 9.1 - 12.0 sec  PTT  Result Value Ref Range   aPTT 29 24 - 33 sec  CBC with Differential/Platelet  Result Value Ref Range   WBC 9.2 3.4 - 10.8 x10E3/uL   RBC 4.01 3.77 - 5.28 x10E6/uL   Hemoglobin 11.6 11.1 - 15.9 g/dL   Hematocrit 36.0 34.0 - 46.6 %   MCV 90 79 - 97 fL   MCH 28.9 26.6 - 33.0 pg   MCHC 32.2 31.5 - 35.7 g/dL   RDW 15.1 12.3 - 15.4 %   Platelets 303 150 - 379 x10E3/uL   Neutrophils 69 Not Estab. %   Lymphs 21 Not Estab. %   Monocytes 8 Not Estab. %   Eos 2 Not Estab. %   Basos 0 Not Estab. %   Neutrophils Absolute 6.2 1.4 - 7.0 x10E3/uL   Lymphocytes Absolute 2.0 0.7 - 3.1 x10E3/uL   Monocytes Absolute 0.8 0.1 - 0.9 x10E3/uL   EOS (ABSOLUTE) 0.2 0.0 - 0.4 x10E3/uL   Basophils Absolute 0.0 0.0 - 0.2 x10E3/uL   Immature Granulocytes 0 Not Estab. %   Immature Grans (Abs) 0.0 0.0 - 0.1 x10E3/uL   Assessment and Plan:   Diagnoses and all orders for this visit:  Adjustment insomnia Comments: DC Ambien. Trial Ativan.  Orders: -     LORazepam (ATIVAN) 1 MG tablet; Take 1 tablet (1 mg total) at bedtime by mouth.  Obesity (BMI 30-39.9) Comments: She has been struggling with this. We reviewed healthy lifestyle choices. Okay for medication-assistance in the future.  Malignant neoplasm of upper-outer quadrant of left breast in female, estrogen receptor positive ([place]) Comments: Patient did not tolerate Tamoxifen. She will be a candidate for Anastrozole s/p  oophorectomy. We discussed the importance of follow up with her Merchandiser, retail.   Cognitive change Comments: Now followed by NeuroPsych. Evaluation today. She will follow up to discuss results next week.   Sleep disorder Comments: Insurance is not covering a split night study ordered by me. Will refer to Sleep Specialist for evaluation.    . Reviewed expectations re: course of current medical issues. . Discussed self-management of symptoms. . Outlined signs and symptoms indicating need for more acute intervention. . Patient verbalized understanding and all questions were answered. Marland Kitchen Health Maintenance issues including appropriate healthy diet, exercise, and smoking avoidance were discussed with  patient. . See orders for this visit as documented in the electronic medical record. . Patient received an After Visit Summary.  Briscoe Deutscher, DO [place], [place] 05/12/2017  Future Appointments  Date Time Provider [place]  05/19/2017  1:00 PM [name], PsyD [facility] None  06/23/2017  9:30 AM [facility] LAB 1 [facility] None  06/23/2017 10:00 AM Magrinat, Virgie Dad, MD [facility] None  07/29/2017  1:30 PM Briscoe Deutscher, DO [facility] None  03/03/2018  1:15 PM [name], MD [place] None

## 2017-05-13 NOTE — Addendum Note (Signed)
Addended by: Durwin Glaze on: 05/13/2017 12:20 PM   Modules accepted: Orders

## 2017-05-14 NOTE — Telephone Encounter (Signed)
Called patient again, left a message that I was checking on her. Told her to call if I could help her in any way.
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
## 2017-05-19 ENCOUNTER — Ambulatory Visit (INDEPENDENT_AMBULATORY_CARE_PROVIDER_SITE_OTHER): Payer: BLUE CROSS/BLUE SHIELD | Admitting: Psychology

## 2017-05-19 ENCOUNTER — Ambulatory Visit: Payer: BLUE CROSS/BLUE SHIELD | Admitting: Obstetrics and Gynecology

## 2017-05-19 ENCOUNTER — Encounter: Payer: Self-pay | Admitting: Psychology

## 2017-05-19 DIAGNOSIS — R413 Other amnesia: Secondary | ICD-10-CM

## 2017-05-19 DIAGNOSIS — G3184 Mild cognitive impairment, so stated: Secondary | ICD-10-CM

## 2017-05-19 NOTE — Patient Instructions (Signed)
Testing results demonstrated several areas of normal cognitive function. Meanwhile, there was a clear relative weakness in your ability to learn and remember new information that is presented orally. The problem appears to be at the level of encoding (initially taking the information in, rather than taking it in but then not recalling it later). Also, there was a relative weakness in what we call working memory, which is your ability to hold information in mind and do something with it (e.g., mental arithmetic). Overall, these areas of weakness suggest trouble "holding onto" information that is spoken to you. As such, utilizing visual/written modalities is highly recommended. You will likely need to take notes, for example, and have visual reminders of information. Additional cognitive strategies that may help you are detailed below. I also think cognitive rehabilitation may be useful; your doctor can consider a referral to [facility] for this.  I also see evidence of depression and anxiety, which can exacerbate cognitive problems in daily life. I highly recommend that you have weekly individual counseling to help with depression/anxiety/stress management.   Finally, I'm recommending your doctor have a brain MRI completed just to make sure we are not missing anything else.    Strategies to enhance cognitive functioning Attention, concentration, memory encoding and consolidation    . Make a plan and be prepared o If you find that you are more attentive at certain times of the day (i.e., the morning), plan important activities and discussions at that time o Determine which activities take the most time and which are most important, then prioritize your "to do list" based on this information o Break tasks into simpler parts, understand the steps involved before starting o Rehearse the steps mentally or write them down. If you write them down, you can use this as a checklist to check off  as you complete them. o Visualize completing the task  . Use external aids  o Write everything down that you do not need to know or work with right now. Don't store extra information in your brain that you don't need right now.  o Use a calendar or planner to make checklists, due dates and "to do" lists. o Use ONLY ONE calendar or planner and look at it frequently o Set alarms for important deadlines or appointments  . Minimize interruptions and distractions  o Find a good work environment, e.g., quiet room with a desk, close curtains, use earplugs, mask sounds with a fan or white noise machine o Turn off cell phone and/or email alerts during important tasks. In fact, it is helpful to schedule a block of time each day where you limit phone and email interruptions and focus on just the more detailed work you have. o Try to minimize the amount of background noise (i.e., television, music) when engaged in important tasks or conversations with others (note that some individuals find soft background music helpful in minimize distraction, so you may need to experiment with optimal level of noise for specific situations)  . Use active effort = consciously attending to details, closely analyzing o Failures of encoding may reflect failure to attend to one's own actions o Be prepared to work more slowly than you usually do  o When reading, allow time for re-reading sections  o Check your work for errors  . Avoid multitasking o Do not attempt to complete more than one task at one time. Focus on one task until it is completed and then move on to the next one. o  Avoid other activities while engaged in important tasks, such as talking on the phone while balancing the checkbook, making a shopping list during a meeting.   . Use self-talk during tasks o Repeat the steps of the activity to yourself as you complete them o Talk to yourself about your progress o This helps you maintain focus on the task and  makes it easier to remember completing the task (Similar to "active effort" above)  . Conserve energy o Conserve energy to avoid fatigue and its effects on cognition - Get enough sleep - Pace yourself  and make sure to take breaks - Be open to receiving help - Exercise for increased energy  . Conversational vigilance = paying attention during a conversation  o Listen actively: focus on the speaker and position yourself so that you can clearly hear the him/her, and have open/relaxed posture  o Eye contact: Maintaining eye contact with the person you are speaking with may increase the likelihood that important information is properly received  o Ask questions: Ask questions for clarification (e.g., request that the speaker explain something in a different way) or ask for information to be repeated if you become distracted, or if you do not hear or understand something during a conversation  o Paraphrase: Summarize or repeat back important information from a conversation in your own words to facilitate communication and ensure that you have heard correctly and understand

## 2017-05-22 LAB — PATHOLOGIST SMEAR REVIEW
Basophils Absolute: 0 10*3/uL (ref 0.0–0.2)
Basos: 0 %
EOS (ABSOLUTE): 0.8 10*3/uL — ABNORMAL HIGH (ref 0.0–0.4)
Eos: 8 %
Hematocrit: 37 % (ref 34.0–46.6)
Hemoglobin: 11.5 g/dL (ref 11.1–15.9)
Immature Grans (Abs): 0 10*3/uL (ref 0.0–0.1)
Immature Granulocytes: 0 %
Lymphocytes Absolute: 2.1 10*3/uL (ref 0.7–3.1)
Lymphs: 22 %
MCH: 28.4 pg (ref 26.6–33.0)
MCHC: 31.1 g/dL — ABNORMAL LOW (ref 31.5–35.7)
MCV: 91 fL (ref 79–97)
Monocytes Absolute: 0.8 10*3/uL (ref 0.1–0.9)
Monocytes: 8 %
Neutrophils Absolute: 5.9 10*3/uL (ref 1.4–7.0)
Neutrophils: 62 %
Platelets: 314 10*3/uL (ref 150–379)
RBC: 4.05 x10E6/uL (ref 3.77–5.28)
RDW: 15.1 % (ref 12.3–15.4)
WBC: 9.5 10*3/uL (ref 3.4–10.8)

## 2017-05-22 LAB — SPECIMEN STATUS REPORT

## 2017-05-23 ENCOUNTER — Other Ambulatory Visit: Payer: Self-pay | Admitting: Oncology

## 2017-05-25 ENCOUNTER — Telehealth: Payer: Self-pay | Admitting: Family Medicine

## 2017-05-25 DIAGNOSIS — R4189 Other symptoms and signs involving cognitive functions and awareness: Secondary | ICD-10-CM

## 2017-05-25 NOTE — Telephone Encounter (Signed)
Per previous message-  Referral recommended by Neuro can be sent ot [facility], and they will be able to assist patient with the cognitive neuro-rehab.   Once referral is placed I can send to appropriate location, and they are aware it is coming to them for scheduling purposes.

## 2017-05-25 NOTE — Telephone Encounter (Signed)
I have placed the new referral. Let me know if it is not correct.

## 2017-06-08 ENCOUNTER — Ambulatory Visit: Payer: Self-pay | Admitting: *Deleted

## 2017-06-08 NOTE — Telephone Encounter (Signed)
  Reason for Disposition . [1] SEVERE pain and [2] not improved 2 hours after analgesic medication (e.g., ibuprofen or acetaminophen)  Answer Assessment - Initial Assessment Questions 1. LOCATION: "Which ear is involved?"     Lt ear pain and left side of face hurts 2. ONSET: "When did the ear start hurting"     For a few days 3. SEVERITY: "How bad is the pain?"  (Scale 1-10; mild, moderate or severe)   - MILD (1-3): doesn't interfere with normal activities    - MODERATE (4-7): interferes with normal activities or awakens from sleep    - SEVERE (8-10): excruciating pain, unable to do any normal activities      7-8 4. URI SYMPTOMS: " Do you have a runny nose or cough?"     no 5. FEVER: "Do you have a fever?" If so, ask: "What is your temperature, how was it measured, and when did it start?"    no 6. CAUSE: "Have you been swimming recently?", "How often do you use Q-TIPS?", "Have you had any recent air travel or scuba diving?"     no 7. OTHER SYMPTOMS: "Do you have any other symptoms?" (e.g., headache, stiff neck, dizziness, vomiting, runny nose, decreased hearing)    Intermittent ringing in both ears 8. PREGNANCY: "Is there any chance you are pregnant?" "When was your last menstrual period?"   no  Protocols used: EAR - SWIMMER'S-A-AH, EARACHE-A-AH

## 2017-06-09 ENCOUNTER — Ambulatory Visit (INDEPENDENT_AMBULATORY_CARE_PROVIDER_SITE_OTHER): Payer: BLUE CROSS/BLUE SHIELD | Admitting: Family Medicine

## 2017-06-09 ENCOUNTER — Encounter: Payer: Self-pay | Admitting: Family Medicine

## 2017-06-09 ENCOUNTER — Other Ambulatory Visit: Payer: Self-pay | Admitting: Family Medicine

## 2017-06-09 VITALS — BP 116/72 | HR 90 | Temp 98.3°F | Ht 64.0 in | Wt 215.6 lb

## 2017-06-09 DIAGNOSIS — R51 Headache: Secondary | ICD-10-CM | POA: Diagnosis not present

## 2017-06-09 DIAGNOSIS — H4710 Unspecified papilledema: Secondary | ICD-10-CM | POA: Diagnosis not present

## 2017-06-09 DIAGNOSIS — G479 Sleep disorder, unspecified: Secondary | ICD-10-CM | POA: Diagnosis not present

## 2017-06-09 DIAGNOSIS — H9202 Otalgia, left ear: Secondary | ICD-10-CM | POA: Diagnosis not present

## 2017-06-09 DIAGNOSIS — R4189 Other symptoms and signs involving cognitive functions and awareness: Secondary | ICD-10-CM | POA: Diagnosis not present

## 2017-06-09 DIAGNOSIS — R519 Headache, unspecified: Secondary | ICD-10-CM

## 2017-06-09 NOTE — Progress Notes (Signed)
[name] is a 41 y.o. female here for an acute visit.  History of Present Illness:   HPI: [name] is a very pleasant patient of mine presenting for an acute issue today.  She has had about 3 weeks of left otalgia as well as left mastoid pain.  She feels the discomfort is worse when she lays on her left side.  She has had no upper respiratory infection signs or symptoms including rhinitis, postnasal drip, sore throat, overt ear pain.  She has had no trauma.  No swimming or other exposures.  She denies any fevers, dizziness, vision changes.  She has done nothing for treatment.  [name] reports that she also saw her ophthalmologist at [facility] today.  She has had a few month history of frequent headaches and has noticed that her floaters were becoming more frequent.  The ophthalmologist, Dr. Jerline Pain, noted disc edema bilaterally.  States he reports that her ophthalmologist was concerned about papilledema and was concerned that the patient may need a lumbar puncture.  Of note the patient did have a negative MRI this year.  She is currently on prednisone for rheumatoid arthritis.  She has had tinnitus.  She is in remission from breast cancer and has completed radiation.  Did have records sent over from [facility] and will have the scan into her chart.  PMHx, SurgHx, SocialHx, Medications, and Allergies were reviewed in the Visit Navigator and updated as appropriate.  Current Medications:   .  hydrochlorothiazide (MICROZIDE) 12.5 MG capsule, TAKE 1 CAPSULE BY MOUTH EVERY DAY, Disp: 30 capsule, Rfl: 2 .  LORazepam (ATIVAN) 1 MG tablet, Take 1 tablet (1 mg total) at bedtime by mouth., Disp: 30 tablet, Rfl: 1 .  omeprazole (PRILOSEC) 40 MG capsule, Take 1 capsule (40 mg total) by mouth daily. [place] (Patient taking differently: Take 40 mg by mouth every morning. [place]), Disp: 30 capsule, Rfl: 11 .  predniSONE (DELTASONE) 10 MG tablet, TAKE  1 TABLET BY MOUTH DAILY FOR 8 WEEKS--- CURRENTLY PER PT IS TAKING HALF TABLET=5MG --- TAKES IN AM, Disp: , Rfl: 0 .  sertraline (ZOLOFT) 100 MG tablet, TAKE 1 TABLET BY MOUTH DAILY (Patient taking differently: TAKE 1 TABLET BY MOUTH DAILY--- TAKES IN AM), Disp: 30 tablet, Rfl: 3 .  traMADol (ULTRAM) 50 MG tablet, Take 1 tablet (50 mg total) by mouth every 6 (six) hours as needed., Disp: 60 tablet, Rfl: 0 .  zolpidem (AMBIEN) 10 MG tablet, Take 1/2 to 1 tab po qhs prn (Patient taking differently: Take 10 mg by mouth at bedtime as needed. Take 1/2 to 1 tab po qhs prn), Disp: 30 tablet, Rfl: 1 .  folic acid (FOLVITE) 1 MG tablet, Take 1 mg by mouth daily., Disp: , Rfl: 3 .  methotrexate (RHEUMATREX) 2.5 MG tablet, 4 TABLETS WEEKLY ORALLY 30 DAYS, Disp: , Rfl: 3   Allergies  Allergen Reactions  . Amoxicillin Shortness Of Breath and Itching    Has patient had a PCN reaction causing immediate rash, facial/tongue/throat swelling, SOB or lightheadedness with hypotension: Yes Has patient had a PCN reaction causing severe rash involving mucus membranes or skin necrosis: No Has patient had a PCN reaction that required hospitalization Yes Has patient had a PCN reaction occurring within the last 10 years: No If all of the above answers are "NO", then may proceed with Cephalosporin use.   . Diflucan [Fluconazole] Nausea Only    heartburn   Review  of Systems:   Pertinent items are noted in the HPI. Otherwise, ROS is negative.  Vitals:   Vitals:   06/09/17 1419  BP: 116/72  Pulse: 90  Temp: 98.3 F (36.8 C)  TempSrc: Oral  SpO2: 98%  Weight: 215 lb 9.6 oz (97.8 kg)  Height: 5\' 4"  (1.626 m)     Body mass index is 37.01 kg/m.   Physical Exam:   Physical Exam  Constitutional: She is oriented to person, place, and time. She appears well-developed and well-nourished. No distress.  HENT:  Head: Normocephalic and atraumatic.    Right Ear: External ear normal.  Left Ear: External ear normal.    Nose: Nose normal.  Mouth/Throat: Oropharynx is clear and moist.  Postauricular ttp. No mastoid edema or skin changes. FROM.   Eyes: Conjunctivae and EOM are normal. Pupils are equal, round, and reactive to light.  Neck: Normal range of motion. Neck supple. No thyromegaly present.  Cardiovascular: Normal rate, regular rhythm, normal heart sounds and intact distal pulses.  Pulmonary/Chest: Effort normal and breath sounds normal.  Abdominal: Soft. Bowel sounds are normal.  Musculoskeletal: Normal range of motion.  Neurological: She is alert and oriented to person, place, and time.  Skin: Skin is warm.  Psychiatric: She has a normal mood and affect. Her behavior is normal.  Nursing note and vitals reviewed.   Assessment and Plan:   Diagnoses and all orders for this visit:  Referred otalgia of left ear Comments: No red flags for mastoiditis today.  Ears are clear.  We reviewed etiologies and red flags.  Labs pending. Orders: -     CBC with Differential/Platelet -     Comprehensive metabolic panel -     Sedimentation rate -     C-reactive protein  Papilledema Comments: Concern for pseudotumor cerebri.  Up with chronic headaches.  Reported previous MRI in the last few months was negative.  Lumbar puncture ordered as below.  Per protocol, CT head without contrast must happen as well. Orders: -     Lumbar Puncture; Future -     DG FLUORO GUIDED LOC OF NEEDLE/CATH TIP FOR SPINAL INJECT LT; Future -     CT Head Wo Contrast; Future  Sleep disorder Comments: Sleep study pending.  Cognitive change Comments: Patient has had evaluation by neuropsych.  Therapy was recommended.  Of note, I did put that referral in, but we have been having difficulty with follow-through.  Morning headache Comments: As above.   . Reviewed expectations re: course of current medical issues. . Discussed self-management of symptoms. . Outlined signs and symptoms indicating need for more acute  intervention. . Patient verbalized understanding and all questions were answered. Marland Kitchen Health Maintenance issues including appropriate healthy diet, exercise, and smoking avoidance were discussed with patient. . See orders for this visit as documented in the electronic medical record. . Patient received an After Visit Summary.  Briscoe Deutscher, DO [place], [place] 06/09/2017  Future Appointments  Date Time Provider [place]  06/11/2017 10:00 AM [facility]-CT 3 [facility]CT [facility]  06/11/2017  1:00 PM [facility]-R/F 1 [facility]DG [facility]  06/18/2017  9:00 AM Jaffe, Adam R, DO [facility] None  06/23/2017  9:30 AM [facility] LAB 1 [facility] None  06/23/2017 10:00 AM Magrinat, Virgie Dad, MD [facility] None  06/23/2017 11:00 AM Chesley Mires, MD [facility] None  07/29/2017  1:30 PM Briscoe Deutscher, DO [facility]  03/03/2018  1:15 PM [name], MD [place] None

## 2017-06-10 ENCOUNTER — Ambulatory Visit (HOSPITAL_COMMUNITY): Admission: RE | Admit: 2017-06-10 | Payer: BLUE CROSS/BLUE SHIELD | Source: Ambulatory Visit

## 2017-06-10 LAB — COMPREHENSIVE METABOLIC PANEL
ALT: 13 U/L (ref 0–35)
AST: 12 U/L (ref 0–37)
Albumin: 4.2 g/dL (ref 3.5–5.2)
Alkaline Phosphatase: 90 U/L (ref 39–117)
BUN: 11 mg/dL (ref 6–23)
CO2: 28 mEq/L (ref 19–32)
Calcium: 8.9 mg/dL (ref 8.4–10.5)
Chloride: 100 mEq/L (ref 96–112)
Creatinine, Ser: 0.73 mg/dL (ref 0.40–1.20)
GFR: 111.61 mL/min (ref >60.00)
Glucose, Bld: 91 mg/dL (ref 70–99)
Potassium: 3.1 mEq/L — ABNORMAL LOW (ref 3.5–5.1)
Sodium: 138 mEq/L (ref 135–145)
Total Bilirubin: 0.2 mg/dL (ref 0.2–1.2)
Total Protein: 7.5 g/dL (ref 6.0–8.3)

## 2017-06-10 LAB — CBC WITH DIFFERENTIAL/PLATELET
Basophils Absolute: 0.1 10*3/uL (ref 0.0–0.1)
Basophils Relative: 0.7 % (ref 0.0–3.0)
Eosinophils Absolute: 0.1 10*3/uL (ref 0.0–0.7)
Eosinophils Relative: 1.1 % (ref 0.0–5.0)
HCT: 36.6 % (ref 36.0–46.0)
Hemoglobin: 11.9 g/dL — ABNORMAL LOW (ref 12.0–15.0)
Lymphocytes Relative: 16.9 % (ref 12.0–46.0)
Lymphs Abs: 2 10*3/uL (ref 0.7–4.0)
MCHC: 32.6 g/dL (ref 30.0–36.0)
MCV: 89.1 fl (ref 78.0–100.0)
Monocytes Absolute: 0.9 10*3/uL (ref 0.1–1.0)
Monocytes Relative: 7.3 % (ref 3.0–12.0)
Neutro Abs: 8.9 10*3/uL — ABNORMAL HIGH (ref 1.4–7.7)
Neutrophils Relative %: 74 % (ref 43.0–77.0)
Platelets: 317 10*3/uL (ref 150.0–400.0)
RBC: 4.1 Mil/uL (ref 3.87–5.11)
RDW: 13.9 % (ref 11.5–15.5)
WBC: 12 10*3/uL — ABNORMAL HIGH (ref 4.0–10.5)

## 2017-06-10 LAB — SEDIMENTATION RATE: Sed Rate: 64 mm/hr — ABNORMAL HIGH (ref 0–20)

## 2017-06-10 LAB — C-REACTIVE PROTEIN: CRP: 1.1 mg/dL (ref 0.5–20.0)

## 2017-06-11 ENCOUNTER — Ambulatory Visit (HOSPITAL_COMMUNITY)
Admission: RE | Admit: 2017-06-11 | Discharge: 2017-06-11 | Disposition: A | Discharge status: Home / Self Care | Payer: BLUE CROSS/BLUE SHIELD | Source: Ambulatory Visit | Attending: Family Medicine | Admitting: Family Medicine

## 2017-06-11 DIAGNOSIS — Z853 Personal history of malignant neoplasm of breast: Secondary | ICD-10-CM | POA: Diagnosis not present

## 2017-06-11 DIAGNOSIS — R51 Headache: Secondary | ICD-10-CM | POA: Diagnosis not present

## 2017-06-11 DIAGNOSIS — J322 Chronic ethmoidal sinusitis: Secondary | ICD-10-CM | POA: Insufficient documentation

## 2017-06-11 DIAGNOSIS — H4710 Unspecified papilledema: Secondary | ICD-10-CM | POA: Diagnosis not present

## 2017-06-11 LAB — CSF CELL COUNT WITH DIFFERENTIAL
RBC Count, CSF: 1 /mm3 — ABNORMAL HIGH
Tube #: 3
WBC, CSF: 1 /mm3 (ref 0–5)

## 2017-06-11 LAB — GLUCOSE, CSF: Glucose, CSF: 62 mg/dL (ref 40–70)

## 2017-06-11 LAB — PROTEIN, CSF: Total  Protein, CSF: 26 mg/dL (ref 15–45)

## 2017-06-11 MED ORDER — LIDOCAINE HCL (PF) 1 % IJ SOLN
5.0000 mL | Freq: Once | INTRAMUSCULAR | Status: AC
  Administered 2017-06-11: 5 mL via INTRADERMAL

## 2017-06-11 MED ORDER — ACETAMINOPHEN 325 MG PO TABS: 650.0000 mg | ORAL_TABLET | ORAL | Status: DC | PRN

## 2017-06-11 NOTE — Progress Notes (Deleted)
Dr. Bridgett Larsson notified concerning patients blood pressure and pain medication.  Patient blood pressure was 160/80 in left upper arm and 180/90 in right upper arm.  Iv was removed by accident.  Patients pain was 6/10 at times an 4/10 at all times.  Dr. Bridgett Larsson did not want to treat blood pressure.  Dr. Bridgett Larsson aware of IV being removed by accident.

## 2017-06-11 NOTE — Discharge Instructions (Signed)
Myelogram and Lumbar Puncture Discharge Instructions ° °1. Go home and rest quietly for the next 24 hours.  It is important to lie flat for the next 24 hours.  Get up only to go to the restroom.  You may lie in the bed or on a couch on your back, your stomach, your left side or your right side.  You may have one pillow under your head.  You may have pillows between your knees while you are on your side or under your knees while you are on your back. ° °2. DO NOT drive today.  Recline the seat as far back as it will go, while still wearing your seat belt, on the way home. ° °3. You may get up to go to the bathroom as needed.  You may sit up for 10 minutes to eat.  You may resume your normal diet and medications unless otherwise indicated. ° °4. The incidence of headache, nausea, or vomiting is about 5% (one in 20 patients).  If you develop a headache, lie flat and drink plenty of fluids until the headache goes away.  Caffeinated beverages may be helpful.  If you develop severe nausea and vomiting or a headache that does not go away with flat bed rest, call [phone]. ° °5. You may resume normal activities after your 24 hours of bed rest is over; however, do not exert yourself strongly or do any heavy lifting tomorrow. ° °6. Call your physician for a follow-up appointment.  The results of your myelogram will be sent directly to your physician by the following day. ° °7. If you have any questions or if complications develop after you arrive home, please call [phone]. ° °Discharge instructions have been explained to the patient.  The patient, or the person responsible for the patient, fully understands these instructions. ° ° °

## 2017-06-14 LAB — CSF CULTURE W GRAM STAIN: Culture: NO GROWTH

## 2017-06-17 ENCOUNTER — Telehealth: Payer: Self-pay | Admitting: Family Medicine

## 2017-06-17 LAB — OLIGOCLONAL BANDS, CSF + SERM

## 2017-06-17 NOTE — Telephone Encounter (Signed)
[name]. Called for lab results from 06/09/17.  Reviewed CBC, CMP, C Reactive Protein, and Sed Rate.  Also reviewed the results on CSF (noted this on result note)   Dr. Juleen China asked how [name]. Is feeling after her lumbar puncture and if her ear still hurts.  The [name]. stated she continues to have left ear pain; reported pain is unchanged from previous.  Also, reported she has intermittent lower back pain that radiates down into right hip and thigh.  Stated she is not sure if this is related to her Fibromyalgia or from the lumbar puncture.  Questioned if she has numbness into the right leg; stated she has had numbness in her legs for awhile, and that this is not new.   Advised will make Dr. Juleen China aware of her complaints.

## 2017-06-17 NOTE — Telephone Encounter (Signed)
Pt called in for lab results however when I was giving her the results she responded that she had received these results earlier from a nurse.   I checked and saw where she was given the results earlier by Michaele Offer, RN from [facility].   Also put answers to Dr. Alcario Drought questions regarding the lumbar puncture and ear pain were in Carol's note.    I asked pt if I could help her with anything else since she already had her lab results.   She said,  "No".

## 2017-06-18 ENCOUNTER — Ambulatory Visit: Payer: BLUE CROSS/BLUE SHIELD | Admitting: Neurology

## 2017-06-23 ENCOUNTER — Encounter: Payer: Self-pay | Admitting: Pulmonary Disease

## 2017-06-23 ENCOUNTER — Other Ambulatory Visit (HOSPITAL_BASED_OUTPATIENT_CLINIC_OR_DEPARTMENT_OTHER): Payer: BLUE CROSS/BLUE SHIELD

## 2017-06-23 ENCOUNTER — Ambulatory Visit (INDEPENDENT_AMBULATORY_CARE_PROVIDER_SITE_OTHER): Payer: BLUE CROSS/BLUE SHIELD | Admitting: Pulmonary Disease

## 2017-06-23 ENCOUNTER — Ambulatory Visit (HOSPITAL_BASED_OUTPATIENT_CLINIC_OR_DEPARTMENT_OTHER): Payer: BLUE CROSS/BLUE SHIELD | Admitting: Oncology

## 2017-06-23 ENCOUNTER — Telehealth: Payer: Self-pay | Admitting: Oncology

## 2017-06-23 VITALS — BP 116/72 | HR 90 | Ht 64.0 in | Wt 218.4 lb

## 2017-06-23 VITALS — BP 129/73 | HR 86 | Temp 97.8°F | Resp 18 | Ht 64.0 in | Wt 219.4 lb

## 2017-06-23 DIAGNOSIS — C50412 Malignant neoplasm of upper-outer quadrant of left female breast: Secondary | ICD-10-CM

## 2017-06-23 DIAGNOSIS — Z853 Personal history of malignant neoplasm of breast: Secondary | ICD-10-CM

## 2017-06-23 DIAGNOSIS — Z170 Estrogen receptor positive status [ER+]: Secondary | ICD-10-CM

## 2017-06-23 DIAGNOSIS — R29818 Other symptoms and signs involving the nervous system: Secondary | ICD-10-CM | POA: Diagnosis not present

## 2017-06-23 LAB — COMPREHENSIVE METABOLIC PANEL
ALT: 12 U/L (ref 0–55)
AST: 11 U/L (ref 5–34)
Albumin: 3.4 g/dL — ABNORMAL LOW (ref 3.5–5.0)
Alkaline Phosphatase: 97 U/L (ref 40–150)
Anion Gap: 10 mEq/L (ref 3–11)
BUN: 11.2 mg/dL (ref 7.0–26.0)
CO2: 25 mEq/L (ref 22–29)
Calcium: 8.7 mg/dL (ref 8.4–10.4)
Chloride: 103 mEq/L (ref 98–109)
Creatinine: 0.8 mg/dL (ref 0.6–1.1)
EGFR: >60 mL/min/{1.73_m2} (ref >60)
Glucose: 101 mg/dl (ref 70–140)
Potassium: 3.1 mEq/L — ABNORMAL LOW (ref 3.5–5.1)
Sodium: 138 mEq/L (ref 136–145)
Total Bilirubin: 0.31 mg/dL (ref 0.20–1.20)
Total Protein: 7.3 g/dL (ref 6.4–8.3)

## 2017-06-23 LAB — CBC WITH DIFFERENTIAL/PLATELET
BASO%: 0.5 % (ref 0.0–2.0)
Basophils Absolute: 0.1 10*3/uL (ref 0.0–0.1)
EOS%: 1 % (ref 0.0–7.0)
Eosinophils Absolute: 0.1 10*3/uL (ref 0.0–0.5)
HCT: 34.2 % — ABNORMAL LOW (ref 34.8–46.6)
HGB: 11.5 g/dL — ABNORMAL LOW (ref 11.6–15.9)
LYMPH%: 25.9 % (ref 14.0–49.7)
MCH: 29 pg (ref 25.1–34.0)
MCHC: 33.5 g/dL (ref 31.5–36.0)
MCV: 86.5 fL (ref 79.5–101.0)
MONO#: 0.7 10*3/uL (ref 0.1–0.9)
MONO%: 6.7 % (ref 0.0–14.0)
NEUT#: 7.2 10*3/uL — ABNORMAL HIGH (ref 1.5–6.5)
NEUT%: 65.9 % (ref 38.4–76.8)
Platelets: 305 10*3/uL (ref 145–400)
RBC: 3.95 10*6/uL (ref 3.70–5.45)
RDW: 13.9 % (ref 11.2–14.5)
WBC: 10.9 10*3/uL — ABNORMAL HIGH (ref 3.9–10.3)
lymph#: 2.8 10*3/uL (ref 0.9–3.3)

## 2017-06-23 NOTE — Patient Instructions (Signed)
Will arrange for home sleep study Will call to arrange for follow up after sleep study reviewed  

## 2017-06-23 NOTE — Progress Notes (Signed)
   Subjective:    Patient ID: [name], female    DOB: 17-Feb-1976, 41 y.o.   MRN: [id number]  HPI    Review of Systems  Constitutional: Positive for unexpected weight change. Negative for fever.  HENT: Positive for dental problem and ear pain. Negative for congestion, nosebleeds, postnasal drip, rhinorrhea, sinus pressure, sneezing, sore throat and trouble swallowing.   Eyes: Negative for redness and itching.  Respiratory: Positive for cough and shortness of breath. Negative for chest tightness and wheezing.   Cardiovascular: Positive for palpitations and leg swelling.  Gastrointestinal: Positive for nausea. Negative for vomiting.  Genitourinary: Negative for dysuria.  Musculoskeletal: Positive for joint swelling.  Skin: Negative for rash.  Allergic/Immunologic: Negative.  Negative for environmental allergies, food allergies and immunocompromised state.  Neurological: Positive for headaches.  Hematological: Bruises/bleeds easily.  Psychiatric/Behavioral: Negative for dysphoric mood. The patient is nervous/anxious.        Objective:   Physical Exam        Assessment & Plan:
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
## 2017-06-23 NOTE — Progress Notes (Signed)
[facility]  Chief Complaint  Patient presents with  . sleep consult    Per pt's husband, pt stops breathing in middle of the night, wakes up about every other hour. Pt wakes up tired with severe headaches and feel that she has pressure behind her eyes when she wakes up in the morning. Pt has night terrors for last 6 months.     Vital signs: BP 116/72 (BP Location: Left Arm, Cuff Size: Normal)   Pulse 90   Ht 5\' 4"  (1.626 m)   Wt 218 lb 6.4 oz (99.1 kg)   SpO2 97%   BMI 37.49 kg/m   History of Present Illness: [name] is a 41 y.o. female for evaluation of sleep problems.  She has history of breast cancer.  She recently completed therapy.  During this process she has noticed more trouble with her sleep.  She snores, and her husband has told her that she stops breathing while asleep.  She has trouble sleeping on her back, and wakes up feeling like she can't breath.  She also wakes up in the middle of dreams and feels anxious with her heart racing.    She goes to sleep at 10 pm.  She falls asleep after 30 minutes.  She wakes up 2 or 3 times a night in the middle of dreams.  She gets out of bed at 7 am.  She feels tired in the morning, and gets headaches almost every morning.   She does not use anything to help her stay awake.  She has been using ambien to help sleep.  She takes this 2 or 3 times per week.  This helps her fall asleep, but not stay asleep.  She denies sleep walking, sleep talking, bruxism, or nightmares.  There is no history of restless legs.  She denies sleep hallucinations, sleep paralysis, or cataplexy.  The Epworth score is 14 out of 24.    Physical Exam:  General - pleasant Eyes - pupils reactive, wears glasses ENT - no sinus tenderness, no oral exudate, no LAN, enlarged tongue, MP 4 Cardiac - regular, no murmur Chest - no wheeze, rales Abd - soft, non tender Ext - no edema Skin - no rashes Neuro - normal  strength Psych - normal mood  Discussion: 41 yo female with snoring, sleep disruption, daytime sleepiness, and apnea.  She has history of depression.  Her BMI is > 35.  I am concerned she could have sleep apnea.  We discussed how sleep apnea can affect various health problems, including risks for hypertension, cardiovascular disease, and diabetes.  We also discussed how sleep disruption can increase risks for accidents, such as while driving.  Weight loss as a means of improving sleep apnea was also reviewed.  Additional treatment options discussed were CPAP therapy, oral appliance, and surgical intervention.  Assessment/Plan:  Suspected sleep apnea. - will arrange for home sleep study   Insomnia. - I suspect much of this could actually be related to arousals from sleep apnea - will reassess her need for ambien after review of her sleep study   Patient Instructions  Will arrange for home sleep study Will call to arrange for follow up after sleep study reviewed     Chesley Mires, MD [place] 06/23/2017, 11:46 AM Pager:  [phone]  Flow Sheet  Pulmonary tests:  Sleep tests:  Cardiac tests: Echo 10/28/16 >> EF 60 to 65%  Events:  Review of Systems: Constitutional: Positive  for unexpected weight change. Negative for fever.  HENT: Positive for dental problem and ear pain. Negative for congestion, nosebleeds, postnasal drip, rhinorrhea, sinus pressure, sneezing, sore throat and trouble swallowing.   Eyes: Negative for redness and itching.  Respiratory: Positive for cough and shortness of breath. Negative for chest tightness and wheezing.   Cardiovascular: Positive for palpitations and leg swelling.  Gastrointestinal: Positive for nausea. Negative for vomiting.  Genitourinary: Negative for dysuria.  Musculoskeletal: Positive for joint swelling.  Skin: Negative for rash.  Allergic/Immunologic: Negative.  Negative for environmental allergies, food  allergies and immunocompromised state.  Neurological: Positive for headaches.  Hematological: Bruises/bleeds easily.  Psychiatric/Behavioral: Negative for dysphoric mood. The patient is nervous/anxious.    Past Medical History: She  has a past medical history of Breast cancer ([place]) (11/2015), Carcinoma of upper-outer quadrant of female breast, left [facility]) (dx 05/ 2017:  oncologist-  dr Jana Hakim), Chronic inflammatory arthritis, Depression, Dyspnea, Edema of both lower extremities, Fibromyalgia, GAD (generalized anxiety disorder), GERD (gastroesophageal reflux disease), Hemorrhoids, Herniated disc, cervical, History of antineoplastic chemotherapy (01-01-2016 to 04-15-2016), History of endometriosis, History of gastritis, History of panic attacks, History of radiation therapy (07-28-2016  to  09-11-2016), History of stomach ulcers (PER PT 2016), Major depression, Migraine, Pelvic pain, PONV (postoperative nausea and vomiting), Seasonal allergies, Seasonal allergies, and Wears contact lenses.  Past Surgical History: She  has a past surgical history that includes Portacath placement (Right, 12/25/2015); Radioactive seed guided mastectomy with axillary sentinel lymph node biopsy (Left, 05/12/2016); Mastopexy (Bilateral, 05/20/2016); Laparoscopic assisted vaginal hysterectomy (01-14-2006   dr leggett  [facility]); laparoscopy (2002); and Colonoscopy with esophagogastroduodenoscopy (egd) (11-03-2016   dr Ardis Hughs).  Family History: Her family history includes Bladder Cancer in her paternal grandmother; Breast cancer in her other and paternal aunt; Cancer in her paternal grandmother; Hypertension in her father; Sarcoidosis in her mother; Throat cancer in her maternal grandfather.  Social History: She  reports that  has never smoked. she has never used smokeless tobacco. She reports that she drinks alcohol. She reports that she does not use drugs.  Medications: Allergies as of 06/23/2017      Reactions   Amoxicillin  Shortness Of Breath, Itching   Has patient had a PCN reaction causing immediate rash, facial/tongue/throat swelling, SOB or lightheadedness with hypotension: Yes Has patient had a PCN reaction causing severe rash involving mucus membranes or skin necrosis: No Has patient had a PCN reaction that required hospitalization Yes Has patient had a PCN reaction occurring within the last 10 years: No If all of the above answers are "NO", then may proceed with Cephalosporin use.   Diflucan [fluconazole] Nausea Only   heartburn      Medication List        Accurate as of 06/23/17 11:46 AM. Always use your most recent med list.          folic acid 1 MG tablet Commonly known as:  FOLVITE Take 1 mg by mouth daily.   hydrochlorothiazide 12.5 MG capsule Commonly known as:  MICROZIDE TAKE 1 CAPSULE BY MOUTH EVERY DAY   LORazepam 1 MG tablet Commonly known as:  ATIVAN Take 1 tablet (1 mg total) at bedtime by mouth.   methotrexate 2.5 MG tablet Commonly known as:  RHEUMATREX 4 TABLETS WEEKLY ORALLY 30 DAYS   omeprazole 40 MG capsule Commonly known as:  PRILOSEC Take 1 capsule (40 mg total) by mouth daily. 30 MINUTES BEFORE BREAKFAST   predniSONE 10 MG tablet Commonly known as:  DELTASONE TAKE  1 TABLET BY MOUTH DAILY FOR 8 WEEKS--- CURRENTLY PER PT IS TAKING HALF TABLET=5MG --- TAKES IN AM   RESTASIS MULTIDOSE 0.05 % ophthalmic emulsion Generic drug:  cycloSPORINE Place 1 drop into both eyes 2 (two) times daily.   sertraline 100 MG tablet Commonly known as:  ZOLOFT TAKE 1 TABLET BY MOUTH DAILY   traMADol 50 MG tablet Commonly known as:  ULTRAM Take 1 tablet (50 mg total) by mouth every 6 (six) hours as needed.   zolpidem 10 MG tablet Commonly known as:  AMBIEN Take 1/2 to 1 tab po qhs prn

## 2017-06-23 NOTE — Telephone Encounter (Signed)
Gave patient AVs and calendar of upcoming December 2019 appointments.

## 2017-06-25 ENCOUNTER — Telehealth: Payer: Self-pay | Admitting: Family Medicine

## 2017-06-25 NOTE — Telephone Encounter (Signed)
Please call the number provided for Dr. Jerline Pain from [facility] to advise.   Copied from [place] [phone]. Topic: Inquiry >> Jun 25, 2017  1:21 PM Oliver Pila B wrote: Reason for CRM: Dr. Jerline Pain from [facility] called to get clarification on a lumbar puncture done 06/09/17 contact Dr. Jerline Pain @ [phone]

## 2017-06-25 NOTE — Telephone Encounter (Signed)
Called Dr Marigene Ehlers office to let them know that you were out of the office. All she wanted to know was the opening pressure of the lumbar puncture. I have given her that information she will call back if she needs any further information.

## 2017-07-28 DIAGNOSIS — G4733 Obstructive sleep apnea (adult) (pediatric): Secondary | ICD-10-CM | POA: Diagnosis not present

## 2017-07-29 ENCOUNTER — Ambulatory Visit (INDEPENDENT_AMBULATORY_CARE_PROVIDER_SITE_OTHER): Payer: BLUE CROSS/BLUE SHIELD | Admitting: Family Medicine

## 2017-07-29 VITALS — BP 124/82 | HR 84 | Temp 98.2°F | Ht 64.0 in | Wt 213.4 lb

## 2017-07-29 DIAGNOSIS — M542 Cervicalgia: Secondary | ICD-10-CM

## 2017-07-29 DIAGNOSIS — I10 Essential (primary) hypertension: Secondary | ICD-10-CM | POA: Diagnosis not present

## 2017-07-29 DIAGNOSIS — G4700 Insomnia, unspecified: Secondary | ICD-10-CM | POA: Diagnosis not present

## 2017-07-29 DIAGNOSIS — E876 Hypokalemia: Secondary | ICD-10-CM

## 2017-07-29 MED ORDER — ZOLPIDEM TARTRATE 10 MG PO TABS: 10.0000 mg | ORAL_TABLET | Freq: Every evening | ORAL | 1 refills | Status: DC | PRN

## 2017-07-29 NOTE — Progress Notes (Signed)
[name] is a 42 y.o. female is here for follow up.  History of Present Illness:   HPI:  Papilledema Concern for pseudotumor cerebri at last visit. LP WNL. Followed by EYE. See media for notes.  Sleep disorder Sleep study pending. Saw Sood on 06/23/17. Sleep study last night.  Cognitive change Patient has had evaluation by neuropsych.  Therapy was recommended.  Of note, I did put that referral in, but we have been having difficulty with follow-through.  Health Maintenance Due  Topic Date Due  . HIV Screening  11/18/1988  . TETANUS/TDAP  11/18/1992  . INFLUENZA VACCINE  02/04/2017   No flowsheet data found.   PMHx, SurgHx, SocialHx, FamHx, Medications, and Allergies were reviewed in the Visit Navigator and updated as appropriate.   Patient Active Problem List   Diagnosis Date Noted  . Hypokalemia 07/29/2017  . Vitamin D deficiency 02/19/2017  . Fatigue 01/28/2017  . Polyarthralgia 01/28/2017  . Cognitive change 01/28/2017  . Depression 01/28/2017  . Obesity (BMI 30-39.9) 01/28/2017  . Primary insomnia 01/28/2017  . Gastroesophageal reflux disease without esophagitis 01/28/2017  . Malignant neoplasm of upper-outer quadrant of left breast in female, estrogen receptor positive ([place]) 07/29/2016  . Genetic testing 01/07/2016  . Breast cancer of upper-outer quadrant of left female breast ([place]) 12/11/2015  . Endometriosis 05/12/2014  . History of hysterectomy for benign disease 05/12/2014   Social History   Tobacco Use  . Smoking status: Never Smoker  . Smokeless tobacco: Never Used  Substance Use Topics  . Alcohol use: Yes    Alcohol/week: 0.0 oz    Comment: socially   . Drug use: No   Current Medications and Allergies:   .  folic acid (FOLVITE) 1 MG tablet, Take 1 mg by mouth daily., Disp: , Rfl: 3 .  hydrochlorothiazide (MICROZIDE) 12.5 MG capsule, TAKE 1 CAPSULE BY MOUTH EVERY DAY, Disp: 30 capsule, Rfl: 2 .  LORazepam (ATIVAN) 1 MG tablet, Take 1  tablet (1 mg total) at bedtime by mouth., Disp: 30 tablet, Rfl: 1 .  methotrexate (RHEUMATREX) 2.5 MG tablet, 4 TABLETS WEEKLY ORALLY 30 DAYS, Disp: , Rfl: 3 .  omeprazole (PRILOSEC) 40 MG capsule, Take 1 capsule (40 mg total) by mouth daily. [place] (Patient taking differently: Take 40 mg by mouth every morning. Lluveras), Disp: 30 capsule, Rfl: 11 .  predniSONE (DELTASONE) 10 MG tablet, TAKE 1 TABLET BY MOUTH DAILY FOR 8 WEEKS--- CURRENTLY PER PT IS TAKING HALF TABLET=5MG --- TAKES IN AM, Disp: , Rfl: 0 .  RESTASIS MULTIDOSE 0.05 % ophthalmic emulsion, Place 1 drop into both eyes 2 (two) times daily., Disp: , Rfl: 0 .  sertraline (ZOLOFT) 100 MG tablet, TAKE 1 TABLET BY MOUTH DAILY (Patient taking differently: TAKE 1 TABLET BY MOUTH DAILY--- TAKES IN AM), Disp: 30 tablet, Rfl: 3 .  traMADol (ULTRAM) 50 MG tablet, Take 1 tablet (50 mg total) by mouth every 6 (six) hours as needed., Disp: 60 tablet, Rfl: 0 .  zolpidem (AMBIEN) 10 MG tablet, Take 1/2 to 1 tab po qhs prn (Patient taking differently: Take 10 mg by mouth at bedtime as needed. Take 1/2 to 1 tab po qhs prn), Disp: 30 tablet, Rfl: 1   Allergies  Allergen Reactions  . Amoxicillin Shortness Of Breath and Itching    Has patient had a PCN reaction causing immediate rash, facial/tongue/throat swelling, SOB or lightheadedness with hypotension: Yes Has patient had a PCN reaction causing severe rash  involving mucus membranes or skin necrosis: No Has patient had a PCN reaction that required hospitalization Yes Has patient had a PCN reaction occurring within the last 10 years: No If all of the above answers are "NO", then may proceed with Cephalosporin use.   . Diflucan [Fluconazole] Nausea Only    heartburn   Review of Systems   Pertinent items are noted in the HPI. Otherwise, ROS is negative.  Vitals:   Vitals:   07/29/17 1343  BP: 124/82  Pulse: 84  Temp: 98.2 F (36.8 C)  TempSrc: Oral    SpO2: 94%  Weight: 213 lb 6.4 oz (96.8 kg)  Height: 5\' 4"  (1.626 m)     Body mass index is 36.63 kg/m.   Physical Exam:   Physical Exam  Constitutional: She is oriented to person, place, and time. She appears well-developed and well-nourished. No distress.  HENT:  Head: Normocephalic and atraumatic.    Right Ear: External ear normal.  Left Ear: External ear normal.  Nose: Nose normal.  Mouth/Throat: Oropharynx is clear and moist.  Postauricular ttp. No mastoid edema or skin changes. FROM.   Eyes: Conjunctivae and EOM are normal. Pupils are equal, round, and reactive to light.  Neck: Normal range of motion. Neck supple. No thyromegaly present.  Cardiovascular: Normal rate, regular rhythm, normal heart sounds and intact distal pulses.  Pulmonary/Chest: Effort normal and breath sounds normal.  Abdominal: Soft. Bowel sounds are normal.  Musculoskeletal: Normal range of motion.  Neurological: She is alert and oriented to person, place, and time.  Skin: Skin is warm.  Psychiatric: She has a normal mood and affect. Her behavior is normal.  Nursing note and vitals reviewed.   Results for orders placed or performed in visit on 07/29/17  Comprehensive metabolic panel  Result Value Ref Range   Sodium 137 135 - 145 mEq/L   Potassium 3.3 (L) 3.5 - 5.1 mEq/L   Chloride 100 96 - 112 mEq/L   CO2 28 19 - 32 mEq/L   Glucose, Bld 92 70 - 99 mg/dL   BUN 14 6 - 23 mg/dL   Creatinine, Ser 0.81 0.40 - 1.20 mg/dL   Total Bilirubin 0.4 0.2 - 1.2 mg/dL   Alkaline Phosphatase 86 39 - 117 U/L   AST 17 0 - 37 U/L   ALT 14 0 - 35 U/L   Total Protein 8.2 6.0 - 8.3 g/dL   Albumin 4.4 3.5 - 5.2 g/dL   Calcium 9.6 8.4 - 10.5 mg/dL   GFR 98.92 >60.00 mL/min   Assessment and Plan:   Diagnoses and all orders for this visit:  Hypokalemia Comments: Results for orders placed or performed in visit on 07/29/17  Comprehensive metabolic panel  Result Value Ref Range   Sodium 137 135 - 145 mEq/L    Potassium 3.3 (L) 3.5 - 5.1 mEq/L   Chloride 100 96 - 112 mEq/L   CO2 28 19 - 32 mEq/L   Glucose, Bld 92 70 - 99 mg/dL   BUN 14 6 - 23 mg/dL   Creatinine, Ser 0.81 0.40 - 1.20 mg/dL   Total Bilirubin 0.4 0.2 - 1.2 mg/dL   Alkaline Phosphatase 86 39 - 117 U/L   AST 17 0 - 37 U/L   ALT 14 0 - 35 U/L   Total Protein 8.2 6.0 - 8.3 g/dL   Albumin 4.4 3.5 - 5.2 g/dL   Calcium 9.6 8.4 - 10.5 mg/dL   GFR 98.92 >60.00 mL/min   Will DC  HCTZ and replace with Spironolactone as patient states that she has had low potassium for years, even before the HCTZ. Orders: -     Comprehensive metabolic panel  Neck pain Comments:  Patient continues to have pain just behind her left ear.  Ear exam and labs have all been normal.  She does have a history of an inflammatory arthritis.  Her grandmother has rheumatoid arthritis and her mother has sarcoidosis.  This pain may be due to more proximal neck pain.  I will ask sports medicine to see her. Orders: -     Ambulatory referral to Sports Medicine  Insomnia, unspecified type Comments: Controlled using Ambien intermittently, 1/2 dose.  No signs of complications, medication side effects, or red flags.  Continue current regimen.   Orders: -     zolpidem (AMBIEN) 10 MG tablet; Take 1 tablet (10 mg total) by mouth at bedtime as needed. Take 1/2 to 1 tab po qhs prn  Essential hypertension Comments: Avoiding excessive salt intake? []   YES  [x]   NO Trying to exercise on a regular basis? []   YES  [x]   NO Review: taking medications as instructed, no medication side effects noted, no TIAs, no chest pain on exertion, no dyspnea on exertion, no swelling of ankles.  Smoker: No.  Wt Readings from Last 3 Encounters:  07/29/17 213 lb 6.4 oz (96.8 kg)  06/23/17 218 lb 6.4 oz (99.1 kg)  06/23/17 219 lb 6.4 oz (99.5 kg)   BP Readings from Last 3 Encounters:  07/29/17 124/82  06/23/17 116/72  06/23/17 129/73   Lab Results  Component Value Date   CREATININE 0.81  07/29/2017    . Reviewed expectations re: course of current medical issues. . Discussed self-management of symptoms. . Outlined signs and symptoms indicating need for more acute intervention. . Patient verbalized understanding and all questions were answered. Marland Kitchen Health Maintenance issues including appropriate healthy diet, exercise, and smoking avoidance were discussed with patient. . See orders for this visit as documented in the electronic medical record. . Patient received an After Visit Summary.  Briscoe Deutscher, DO [place] [place] 07/30/2017  Future Appointments  Date Time Provider [place]  09/29/2017  8:00 AM Pieter Partridge, DO [facility] None  10/27/2017  2:20 PM Briscoe Deutscher, DO [facility]  03/03/2018  1:15 PM [name], MD [place] None  06/08/2018  9:00 AM [facility] LAB 5 [facility] None  06/08/2018  9:30 AM Magrinat, Virgie Dad, MD [facility] None

## 2017-07-30 ENCOUNTER — Encounter: Payer: Self-pay | Admitting: Family Medicine

## 2017-07-30 LAB — COMPREHENSIVE METABOLIC PANEL
ALT: 14 U/L (ref 0–35)
AST: 17 U/L (ref 0–37)
Albumin: 4.4 g/dL (ref 3.5–5.2)
Alkaline Phosphatase: 86 U/L (ref 39–117)
BUN: 14 mg/dL (ref 6–23)
CO2: 28 mEq/L (ref 19–32)
Calcium: 9.6 mg/dL (ref 8.4–10.5)
Chloride: 100 mEq/L (ref 96–112)
Creatinine, Ser: 0.81 mg/dL (ref 0.40–1.20)
GFR: 98.92 mL/min (ref >60.00)
Glucose, Bld: 92 mg/dL (ref 70–99)
Potassium: 3.3 mEq/L — ABNORMAL LOW (ref 3.5–5.1)
Sodium: 137 mEq/L (ref 135–145)
Total Bilirubin: 0.4 mg/dL (ref 0.2–1.2)
Total Protein: 8.2 g/dL (ref 6.0–8.3)

## 2017-07-30 MED ORDER — SPIRONOLACTONE 25 MG PO TABS: 25.0000 mg | ORAL_TABLET | Freq: Every day | ORAL | 3 refills | Status: DC

## 2017-08-04 ENCOUNTER — Encounter: Payer: Self-pay | Admitting: Sports Medicine

## 2017-08-04 ENCOUNTER — Ambulatory Visit: Payer: BLUE CROSS/BLUE SHIELD | Admitting: Sports Medicine

## 2017-08-04 VITALS — BP 124/84 | HR 80 | Ht 64.0 in | Wt 217.4 lb

## 2017-08-04 DIAGNOSIS — M9901 Segmental and somatic dysfunction of cervical region: Secondary | ICD-10-CM | POA: Diagnosis not present

## 2017-08-04 DIAGNOSIS — M24552 Contracture, left hip: Secondary | ICD-10-CM

## 2017-08-04 DIAGNOSIS — M542 Cervicalgia: Secondary | ICD-10-CM | POA: Diagnosis not present

## 2017-08-04 DIAGNOSIS — M9908 Segmental and somatic dysfunction of rib cage: Secondary | ICD-10-CM

## 2017-08-04 DIAGNOSIS — M9902 Segmental and somatic dysfunction of thoracic region: Secondary | ICD-10-CM

## 2017-08-04 NOTE — Patient Instructions (Signed)
Also check out [employer]" which is a program developed by Dr. Minerva Ends.   There are links to a couple of his YouTube Videos below and I would like to see performing one of his videos 5-6 days per week.    A good intro video is: "Independence from Pain 7-minute Video" - [email or web address]   His more advanced video is: "Powerful Posture and Pain Relief: 12 minutes of Foundation Training" - https://[email or web address].[email or web address]  Do not try to attempt this entire video when first beginning.    Try breaking of each exercise that he goes into shorter segments.  Otherwise if they perform an exercise for 45 seconds, start with 15 seconds and rest and then resume when they begin the new activity.    If you work your way up to doing this 12 minute video, I expect you will see significant improvements in your pain.  If you enjoy his videos and would like to find out more you can look on his website: [email or web address]  He has a workout streaming option as well as a DVD set available for purchase.  Amazon has the best price for his DVDs.     Please perform the exercise program that we have prepared for you and gone over in detail on a daily basis.  In addition to the handout you were provided you can access your program through: [email or web address]   Your unique program code is: [email or web address]

## 2017-08-04 NOTE — Assessment & Plan Note (Addendum)
Patient does have significantly tight hip flexors and I believe this is contributing to some of her mid back and neck pain. Long discussion today regarding increasing her physical activity as well as trying to perform daily therapeutic exercises while avoiding exacerbating activities.  Follow-up for repeat evaluation and consideration of repeat osteopathic ablation

## 2017-08-04 NOTE — Progress Notes (Signed)
Juanda Bond. [place], [place] [facility] [place]  [name] - 42 y.o. female MRN [id number]  Date of birth: Apr 15, 1976  Visit Date: 08/04/2017  PCP: Briscoe Deutscher, DO   Referred by: Briscoe Deutscher, DO   Scribe for today's visit: Josepha Pigg, CMA     SUBJECTIVE:  [name] is here for New Patient (Initial Visit) (neck pain) .  Referred by: Dr. Briscoe Deutscher Her neck and t-spine/rib pain symptoms INITIALLY: Began about 2 months ago for neck pain; 2 weeks ago for rib pain. Sx started after finishing chemo. She does not recall doing anything strenuous.  Described as moderate-severe, rib pain is tender to touch and feels at times like something is popping out of place. Rib pain radiates from the sides to the spine. Neck pain is more of a deep ache. No radiation of pain into arms or fingers. Worsened when first getting up in the mornings. Pain is worse with twisting and turning. She has noticed increased pain behind the LT ear when wearing glasses. Back pain is worse on LT side.  Improved with medication Additional associated symptoms include: pain is located just behind the LT ear, ear exams have been normal. She has history of inflammatory arthritis. Grandmother has RA and Mother has sarcoidosis.     At this time symptoms show no change compared to onset  She has been taking Tramadol with some relief. She has tried icing her neck with some relief. She has tried applying heat and Pennsaid to her ribs/back with some relief.    ROS Reports night time disturbances. Denies fevers, chills, or night sweats. Denies unexplained weight loss. Reports personal history of cancer, LT breast cancer. Denies changes in bowel or bladder habits. Denies recent unreported falls. Reports new or worsening dyspnea or wheezing. Reports headaches.  Reports numbness, tingling or weakness in the extremities (side effects of treatment).  Denies  dizziness or presyncopal episodes Denies lower extremity edema     HISTORY & PERTINENT PRIOR DATA:  Prior History reviewed and updated per electronic medical record.  Significant history, findings, studies and interim changes include:  reports that  has never smoked. she has never used smokeless tobacco. No results for input(s): HGBA1C, LABURIC, CREATINE in the last 8760 hours. Breast Cancer Survivorship Care Plan Reviewed and Scanned into Media Problem  Hip Flexor Tightness, Left  Neck Pain    OBJECTIVE:  VS:  HT:5\' 4"  (162.6 cm)   WT:217 lb 6.4 oz (98.6 kg)  BMI:37.3    BP:124/84  HR:80bpm  TEMP: ( )  RESP:97 %   PHYSICAL EXAM: Constitutional: WDWN, Non-toxic appearing. Psychiatric: Alert & appropriately interactive.  Not depressed or anxious appearing. Respiratory: No increased work of breathing.  Trachea Midline Eyes: Pupils are equal.  EOM intact without nystagmus.  No scleral icterus  NEUROVASCULAR exam: No clubbing or cyanosis appreciated No significant venous stasis changes Capillary Refill: normal, less than 2 seconds   UPPER Extremities   Generalized UE Edema: No significant edema Radial Pulses: Normal & symmetrically palpable Dermatomes intact to light touch Normal strength in all myotomes Reflexes: Normal & symmetric DTRs    Findings:  Low back exam is overall reassuring with generalized tightness and slight dextroscoliosis midthoracic.  She has osteopathic findings per procedure note.  Upper extremity strength and sensation is intact.  She does have small amount of change and slight functional rotation of the cervical spine does improve with manipulation.  Negative Spurling's  compression test negative Lhermitte's compression test negative brachial plexus squeeze.  Osteopathic exam per procedure note     ASSESSMENT & PLAN:   1. Neck pain   2. Hip flexor tightness, left   3. Segmental and somatic dysfunction of cervical region   4. Segmental and  somatic dysfunction of thoracic region   5. Segmental and somatic dysfunction of rib cage    PLAN:    Neck pain Functional scoliosis with underlying tight hip flexors that are contributing to a significant anterior chain dominance with muscle spasms causing some discomfort in both the neck and the mid back.  She does have fibromyalgia as well and this is complicating her activity level but I did encourage her to try to begin exercising on a daily basis.  Hip flexor tightness, left Patient does have significantly tight hip flexors and I believe this is contributing to some of her mid back and neck pain. Long discussion today regarding increasing her physical activity as well as trying to perform daily therapeutic exercises while avoiding exacerbating activities.  Follow-up for repeat evaluation and consideration of repeat osteopathic ablation   ++++++++++++++++++++++++++++++++++++++++++++ Orders & Meds: Orders Placed This Encounter  Procedures  . OSTEOPATHIC MANIPULATION TREATMENT  . Misc procedure    No orders of the defined types were placed in this encounter.   ++++++++++++++++++++++++++++++++++++++++++++ Follow-up: Return in about 3 weeks (around 08/25/2017).   Pertinent documentation may be included in additional procedure notes, imaging studies, problem based documentation and patient instructions. Please see these sections of the encounter for additional information regarding this visit. CMA/ATC served as Education administrator during this visit. History, Physical, and Plan performed by medical provider. Documentation and orders reviewed and attested to.      Gerda Diss, [place] [facility] Physician

## 2017-08-04 NOTE — Procedures (Signed)
PROCEDURE NOTE : OSTEOPATHIC MANIPULATION The decision today to treat with Osteopathic Manipulative Therapy (OMT) was based on physical exam findings. Verbal consent was obtained following a discussion with the patient regarding the of risks, benefits and potential side effects, including an acute pain flare,post manipulation soreness and need for repeat treatments. Additionally, we specifically discussed the minimal risk of  injury to neurovascular structures associated with Cervical manipulation.   Contraindications to OMT reviewed and include: Acute fracture/dislocation  Manipulation was performed as below: Regions Treated: cervial spine, thoracic spine, ribs Techniques used: HVLA, muscle energy, myofascial release and Articulatory The patient tolerated the treatment well and reported Improved symptoms following treatment today. Patient was given medications, exercises, stretches and lifestyle modifications per AVS and verbally.     OSTEOPATHIC/STRUCTURAL EXAM FINDINGS:    C2 FRS right  C4 through C6 rotated left  T2 FRS right  Posterior rib 10 on the right  T2 through T6 neutral rotated left, side bent right

## 2017-08-04 NOTE — Assessment & Plan Note (Signed)
Functional scoliosis with underlying tight hip flexors that are contributing to a significant anterior chain dominance with muscle spasms causing some discomfort in both the neck and the mid back.  She does have fibromyalgia as well and this is complicating her activity level but I did encourage her to try to begin exercising on a daily basis.

## 2017-08-04 NOTE — Procedures (Signed)
PROCEDURE NOTE: THERAPEUTIC EXERCISES (97110) 15 minutes spent for Therapeutic exercises as below and as referenced in the AVS. This included exercises focusing on stretching, strengthening, with significant focus on eccentric aspects.  Proper technique shown and discussed handout in great detail with ATC. All questions were discussed and answered.   Long term goals include an improvement in range of motion, strength, endurance as well as avoiding reinjury. Frequency of visits is one time as determined during today's  office visit. Frequency of exercises to be performed is as per handout.  EXERCISES REVIEWED:  Thoracic mobility exercises  Goodman exercises and hip flexor stretching

## 2017-08-15 ENCOUNTER — Telehealth: Payer: Self-pay | Admitting: Pulmonary Disease

## 2017-08-15 ENCOUNTER — Encounter: Payer: Self-pay | Admitting: Pulmonary Disease

## 2017-08-15 DIAGNOSIS — G4733 Obstructive sleep apnea (adult) (pediatric): Secondary | ICD-10-CM

## 2017-08-15 HISTORY — DX: Obstructive sleep apnea (adult) (pediatric): G47.33

## 2017-08-15 NOTE — Telephone Encounter (Signed)
HST 07/28/17 >> AHI 9.2, SaO2 low 71%.   Will have my nurse inform pt that sleep study shows mild sleep apnea.  Options are 1) CPAP now, 2) ROV first.  If She is agreeable to CPAP, then please send order for auto CPAP range 5 to 15 cm H2O with heated humidity and mask of choice.  Have download sent 1 month after starting CPAP and set up ROV 2 months after starting CPAP.  ROV can be with me or NP.

## 2017-08-17 NOTE — Telephone Encounter (Signed)
Pt is calling back [phone]

## 2017-08-17 NOTE — Telephone Encounter (Signed)
Spoke with pt, aware of results/recs.  Pt states she has several questions regarding cpap therapy and options, and would like an OV.  Scheduled pt to see TP as [facility] does not have any openings for the next few weeks.  Nothing further needed at this time.

## 2017-08-17 NOTE — Telephone Encounter (Signed)
lmtcb x1 for pt. 

## 2017-08-20 ENCOUNTER — Other Ambulatory Visit: Payer: Self-pay | Admitting: *Deleted

## 2017-08-20 DIAGNOSIS — R29818 Other symptoms and signs involving the nervous system: Secondary | ICD-10-CM

## 2017-08-25 ENCOUNTER — Encounter: Payer: Self-pay | Admitting: Adult Health

## 2017-08-25 ENCOUNTER — Ambulatory Visit (INDEPENDENT_AMBULATORY_CARE_PROVIDER_SITE_OTHER): Payer: BLUE CROSS/BLUE SHIELD | Admitting: Adult Health

## 2017-08-25 VITALS — BP 122/74 | HR 87 | Ht 64.0 in | Wt 219.2 lb

## 2017-08-25 DIAGNOSIS — G4733 Obstructive sleep apnea (adult) (pediatric): Secondary | ICD-10-CM | POA: Diagnosis not present

## 2017-08-25 DIAGNOSIS — E669 Obesity, unspecified: Secondary | ICD-10-CM | POA: Diagnosis not present
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
## 2017-08-25 NOTE — Patient Instructions (Signed)
Begin CPAP At bedtime   Try to wear at least 4-6hr each night  Work on healthy weight  Do not drive if sleepy  Follow up with Dr. Halford Chessman  Or Parrett NP in 6-8 weeks and As needed

## 2017-08-25 NOTE — Assessment & Plan Note (Signed)
Mild sleep apnea begin CPAP at bedtime  Plan  Patient Instructions  Begin CPAP At bedtime   Try to wear at least 4-6hr each night  Work on healthy weight  Do not drive if sleepy  Follow up with Dr. Halford Chessman  Or Parrett NP in 6-8 weeks and As needed

## 2017-08-25 NOTE — Assessment & Plan Note (Signed)
-   Weight loss 

## 2017-08-26 ENCOUNTER — Telehealth: Payer: Self-pay | Admitting: Family Medicine

## 2017-08-26 DIAGNOSIS — M2550 Pain in unspecified joint: Secondary | ICD-10-CM

## 2017-08-26 NOTE — Telephone Encounter (Signed)
Pt is requesting a refill on tramadol. The prescriber is unknown.  And she wants to change her pharmacy location.  LOV  07/29/17 NOV  10/27/17  Please review.

## 2017-08-26 NOTE — Telephone Encounter (Signed)
Copied from [place]. Topic: Quick Communication - See Telephone Encounter >> Aug 26, 2017  2:53 PM Hewitt Shorts wrote: CRM for notification. See Telephone encounter for: pt is needing a refill on tramadol [facility] [place]  [id number]  [phone] best number for pt [phone]   08/26/17.

## 2017-08-26 NOTE — Telephone Encounter (Signed)
See note

## 2017-08-26 NOTE — Progress Notes (Signed)
Reviewed and agree with assessment/plan.   Vineet Sood, MD [facility] [place] [facility] 07/02/2016, 12:24 PM Pager:  [phone]  

## 2017-08-27 MED ORDER — TRAMADOL HCL 50 MG PO TABS: 50.0000 mg | ORAL_TABLET | Freq: Four times a day (QID) | ORAL | 0 refills | Status: DC | PRN

## 2017-08-27 NOTE — Telephone Encounter (Signed)
Please advise on refill. It does not look like you have filled this.

## 2017-08-27 NOTE — Telephone Encounter (Signed)
Sent to pharmacy 

## 2017-09-03 ENCOUNTER — Encounter: Payer: Self-pay | Admitting: Oncology

## 2017-09-03 NOTE — Progress Notes (Signed)
Patient left a voicemail regarding applying for disability.  Called patient back and asked whom determined she was disabled. She states her PCP. Advised patient she may her to reach out to the [place] to apply. She verbalized understanding.

## 2017-09-14 ENCOUNTER — Other Ambulatory Visit: Payer: Self-pay | Admitting: Physician Assistant

## 2017-09-14 ENCOUNTER — Telehealth: Payer: Self-pay | Admitting: *Deleted

## 2017-09-14 NOTE — Telephone Encounter (Signed)
LM for the patient to call me.  I need to know if she takes the Omeprazole 40 mg once or twice daily.

## 2017-09-16 ENCOUNTER — Other Ambulatory Visit: Payer: Self-pay | Admitting: *Deleted

## 2017-09-16 MED ORDER — OMEPRAZOLE 40 MG PO CPDR: DELAYED_RELEASE_CAPSULE | ORAL | 3 refills | Status: DC

## 2017-09-16 NOTE — Telephone Encounter (Signed)
Spoke to patient and asked if she took the Omeprazole 40 mg, once or twice daily? She said once daily but she finds that she has trouble with reflux later in the day after dinner.  I advised her to take 1 tablet 30 min before breakfast and 30 min before dinner.  I asked her to call me in 2-3 weeks to let me know how she is doing. She thanked me for calling.

## 2017-09-29 ENCOUNTER — Ambulatory Visit: Payer: BLUE CROSS/BLUE SHIELD | Admitting: Neurology

## 2017-10-07 NOTE — Telephone Encounter (Signed)
LM for the patient asking again if she takes the Omeprazole 40 mg once or twice daily.

## 2017-10-14 ENCOUNTER — Encounter: Payer: Self-pay | Admitting: Pulmonary Disease

## 2017-10-14 ENCOUNTER — Other Ambulatory Visit: Payer: Self-pay | Admitting: Family Medicine

## 2017-10-14 ENCOUNTER — Ambulatory Visit: Payer: BLUE CROSS/BLUE SHIELD | Admitting: Pulmonary Disease

## 2017-10-14 VITALS — BP 118/82 | HR 85 | Ht 64.0 in | Wt 215.0 lb

## 2017-10-14 DIAGNOSIS — Z9989 Dependence on other enabling machines and devices: Secondary | ICD-10-CM

## 2017-10-14 DIAGNOSIS — G4733 Obstructive sleep apnea (adult) (pediatric): Secondary | ICD-10-CM | POA: Diagnosis not present

## 2017-10-14 DIAGNOSIS — M2550 Pain in unspecified joint: Secondary | ICD-10-CM

## 2017-10-14 NOTE — Telephone Encounter (Signed)
Rx refill request: Ultram 50 mg  LOV: 07/29/17  PCP: [place] : [place]

## 2017-10-14 NOTE — Telephone Encounter (Signed)
Copied from [place] [id number]. Topic: Quick Communication - Rx Refill/Question >> Oct 14, 2017  3:12 PM Waylan Rocher, Lumin L wrote: Medication: traMADol (ULTRAM) 50 MG tablet Has the patient contacted their pharmacy? Yes.   (Agent: If no, request that the patient contact the pharmacy for the refill.) Preferred Pharmacy (with phone number or street name): [id number] [place], [place] AT [place] [address] [place] [address] Phone: [phone] Fax: [phone] Agent: Please be advised that RX refills may take up to 3 business days. We ask that you follow-up with your pharmacy.

## 2017-10-14 NOTE — Progress Notes (Signed)
[facility]  Chief Complaint  Patient presents with  . Follow-up    report she is still getting use to the C-pap    Vital signs: BP 118/82 (BP Location: Left Arm, Cuff Size: Normal)   Pulse 85   Ht 5\' 4"  (1.626 m)   Wt 215 lb (97.5 kg)   SpO2 97%   BMI 36.90 kg/m   History of Present Illness: [name] is a 42 y.o. female with obstructive sleep apnea.  She has been using CPAP.  She has tried several different masks.  She feels the pressure is too high at time and she gets mask leak.  She is still adjusting to having a mask on her face, and her tubing gets tangled.   Physical Exam:  General - pleasant Eyes - pupils reactive ENT - no sinus tenderness, no oral exudate, no LAN, MP 4, enlarged tongue Cardiac - regular, no murmur Chest - no wheeze, rales Abd - soft, non tender Ext - no edema Skin - no rashes Neuro - normal strength Psych - normal mood   Assessment/Plan:  Obstructive sleep apnea. - she has some benefit from CPAP, but still adjusting to mask and pressure - discussed options to help with mask fit - will change her to CPAP 6 cm H2O - if she continues to have trouble with CPAP, then could look into an oral appliance   Patient Instructions  Will have your CPAP changed to 6 cm H2O  Follow up in 4 months    Chesley Mires, MD [place] 10/14/2017, 1:53 PM  Flow Sheet  Sleep tests: HST 07/28/17 >> AHI 9.2, SaO2 low 71%.  Cardiac tests: Echo 10/28/16 >> EF 60 to 65%  Past Medical History: She  has a past medical history of Breast cancer ([place]) (11/2015), Carcinoma of upper-outer quadrant of female breast, left [facility]) (dx 05/ 2017:  oncologist-  dr Jana Hakim), Chronic inflammatory arthritis, Depression, Dyspnea, Edema of both lower extremities, Fibromyalgia, GAD (generalized anxiety disorder), GERD (gastroesophageal reflux disease), Hemorrhoids, Herniated disc, cervical, History of  antineoplastic chemotherapy (01-01-2016 to 04-15-2016), History of endometriosis, History of gastritis, History of panic attacks, History of radiation therapy (07-28-2016  to  09-11-2016), History of stomach ulcers (PER PT 2016), Major depression, Migraine, OSA (obstructive sleep apnea) (08/15/2017), Pelvic pain, PONV (postoperative nausea and vomiting), Seasonal allergies, Seasonal allergies, and Wears contact lenses.  Past Surgical History: She  has a past surgical history that includes Portacath placement (Right, 12/25/2015); Radioactive seed guided mastectomy with axillary sentinel lymph node biopsy (Left, 05/12/2016); Mastopexy (Bilateral, 05/20/2016); Laparoscopic assisted vaginal hysterectomy (01-14-2006   dr leggett  [facility]); laparoscopy (2002); and Colonoscopy with esophagogastroduodenoscopy (egd) (11-03-2016   dr Ardis Hughs).  Family History: Her family history includes Bladder Cancer in her paternal grandmother; Breast cancer in her other and paternal aunt; Cancer in her paternal grandmother; Hypertension in her father; Sarcoidosis in her mother; Throat cancer in her maternal grandfather.  Social History: She  reports that she has never smoked. She has never used smokeless tobacco. She reports that she drinks alcohol. She reports that she does not use drugs.  Medications: Allergies as of 10/14/2017      Reactions   Amoxicillin Shortness Of Breath, Itching   Has patient had a PCN reaction causing immediate rash, facial/tongue/throat swelling, SOB or lightheadedness with hypotension: Yes Has patient had a PCN reaction causing severe rash involving mucus membranes or skin necrosis: No Has patient had a PCN reaction that  required hospitalization Yes Has patient had a PCN reaction occurring within the last 10 years: No If all of the above answers are "NO", then may proceed with Cephalosporin use.   Diflucan [fluconazole] Nausea Only   heartburn      Medication List        Accurate as of 10/14/17   1:53 PM. Always use your most recent med list.          folic acid 1 MG tablet Commonly known as:  FOLVITE Take 1 mg by mouth daily.   methotrexate 2.5 MG tablet Commonly known as:  RHEUMATREX 4 TABLETS WEEKLY ORALLY 30 DAYS   omeprazole 40 MG capsule Commonly known as:  PRILOSEC Take 1 tablet before breakfast and before dinner.   predniSONE 10 MG tablet Commonly known as:  DELTASONE TAKE 1 TABLET BY MOUTH DAILY FOR 8 WEEKS--- CURRENTLY PER PT IS TAKING HALF TABLET=5MG --- TAKES IN AM   RESTASIS MULTIDOSE 0.05 % ophthalmic emulsion Generic drug:  cycloSPORINE Place 1 drop into both eyes 2 (two) times daily.   sertraline 100 MG tablet Commonly known as:  ZOLOFT TAKE 1 TABLET BY MOUTH DAILY   spironolactone 25 MG tablet Commonly known as:  ALDACTONE Take 1 tablet (25 mg total) by mouth daily.   traMADol 50 MG tablet Commonly known as:  ULTRAM Take 1 tablet (50 mg total) by mouth every 6 (six) hours as needed.   zolpidem 10 MG tablet Commonly known as:  AMBIEN Take 1 tablet (10 mg total) by mouth at bedtime as needed. Take 1/2 to 1 tab po qhs prn

## 2017-10-14 NOTE — Patient Instructions (Signed)
Will have your CPAP changed to 6 cm H2O  Follow up in 4 months

## 2017-10-15 MED ORDER — TRAMADOL HCL 50 MG PO TABS: 50.0000 mg | ORAL_TABLET | Freq: Four times a day (QID) | ORAL | 0 refills | Status: DC | PRN

## 2017-10-15 NOTE — Telephone Encounter (Signed)
Ok to refill 

## 2017-10-17 ENCOUNTER — Other Ambulatory Visit: Payer: Self-pay | Admitting: Family Medicine

## 2017-10-17 DIAGNOSIS — M2550 Pain in unspecified joint: Secondary | ICD-10-CM

## 2017-10-19 ENCOUNTER — Other Ambulatory Visit: Payer: Self-pay | Admitting: Oncology

## 2017-10-20 ENCOUNTER — Telehealth: Payer: Self-pay | Admitting: Family Medicine

## 2017-10-20 DIAGNOSIS — M2550 Pain in unspecified joint: Secondary | ICD-10-CM

## 2017-10-20 MED ORDER — TRAMADOL HCL 50 MG PO TABS: 50.0000 mg | ORAL_TABLET | Freq: Four times a day (QID) | ORAL | 0 refills | Status: DC | PRN

## 2017-10-20 NOTE — Telephone Encounter (Signed)
[facility] [place]

## 2017-10-20 NOTE — Telephone Encounter (Signed)
**  Remind patient they can make refill requests via MyChart**  Medication refill request (Name & Dosage):  traMADol (ULTRAM) 50 MG tablet [[id number]]    Preferred [facility] ([facility] & [id number] [place], [place].    Other comments (if applicable):   Refill states printed- patient says [facility]. Patient states she is completely out of medication, took last one this morning.

## 2017-10-20 NOTE — Telephone Encounter (Signed)
Pt requesting the traMADol (ULTRAM) 50 MG tablet be [facility] to [id number] [place], [place] AT [place] due to her moving.

## 2017-10-21 NOTE — Telephone Encounter (Signed)
Patient called because the [facility] does not [facility] prescription. Please call the patient to advise.

## 2017-10-21 NOTE — Telephone Encounter (Signed)
Called in Tramadol to the pharmacy. They did not receive the faxes that we had sent. I notified the patient that the prescription is there now.

## 2017-10-26 NOTE — Progress Notes (Signed)
[name] is a 42 y.o. female is here for follow up.  History of Present Illness:   HPI: Patient states she noticed a knot on left breast near scar from surgery. No redness or swelling but skin is irritated. She is also noticed rash on both feet that started over the weekend.   Health Maintenance Due  Topic Date Due  . HIV Screening  11/18/1988  . TETANUS/TDAP  11/18/1992   No flowsheet data found.   PMHx, SurgHx, SocialHx, FamHx, Medications, and Allergies were reviewed in the Visit Navigator and updated as appropriate.   Patient Active Problem List   Diagnosis Date Noted  . OSA (obstructive sleep apnea) 08/15/2017  . Hip flexor tightness, left 08/04/2017  . Neck pain 08/04/2017  . Hypokalemia 07/29/2017  . Vitamin D deficiency 02/19/2017  . Fatigue 01/28/2017  . Polyarthralgia 01/28/2017  . Cognitive change 01/28/2017  . Depression 01/28/2017  . Obesity (BMI 30-39.9) 01/28/2017  . Primary insomnia 01/28/2017  . Gastroesophageal reflux disease without esophagitis 01/28/2017  . Malignant neoplasm of upper-outer quadrant of left breast in female, estrogen receptor positive ([place]) 07/29/2016  . Genetic testing 01/07/2016  . Breast cancer of upper-outer quadrant of left female breast ([place]) 12/11/2015  . Endometriosis 05/12/2014  . History of hysterectomy for benign disease 05/12/2014   Social History   Tobacco Use  . Smoking status: Never Smoker  . Smokeless tobacco: Never Used  Substance Use Topics  . Alcohol use: Yes    Alcohol/week: 0.0 oz    Comment: socially   . Drug use: No   Current Medications and Allergies:   .  folic acid (FOLVITE) 1 MG tablet, Take 1 mg by mouth daily., Disp: , Rfl: 3 .  methotrexate (RHEUMATREX) 2.5 MG tablet, 4 TABLETS WEEKLY ORALLY 30 DAYS, Disp: , Rfl: 3 .  omeprazole (PRILOSEC) 40 MG capsule, Take 1 tablet before breakfast and before dinner., Disp: 180 capsule, Rfl: 3 .  predniSONE (DELTASONE) 10 MG tablet, TAKE 1 TABLET BY  MOUTH DAILY FOR 8 WEEKS--- CURRENTLY PER PT IS TAKING HALF TABLET=5MG --- TAKES IN AM, Disp: , Rfl: 0 .  RESTASIS MULTIDOSE 0.05 % ophthalmic emulsion, Place 1 drop into both eyes 2 (two) times daily., Disp: , Rfl: 0 .  sertraline (ZOLOFT) 100 MG tablet, TAKE 1 TABLET BY MOUTH DAILY--- TAKES IN AM, Disp: 30 tablet, Rfl: 3 .  spironolactone (ALDACTONE) 25 MG tablet, Take 1 tablet (25 mg total) by mouth daily., Disp: 30 tablet, Rfl: 3 .  traMADol (ULTRAM) 50 MG tablet, Take 1 tablet (50 mg total) by mouth every 6 (six) hours as needed., Disp: 60 tablet, Rfl: 0 .  zolpidem (AMBIEN) 10 MG tablet, Take 1 tablet (10 mg total) by mouth at bedtime as needed. Take 1/2 to 1 tab po qhs prn, Disp: 30 tablet, Rfl: 1   Allergies  Allergen Reactions  . Amoxicillin Shortness Of Breath and Itching    Has patient had a PCN reaction causing immediate rash, facial/tongue/throat swelling, SOB or lightheadedness with hypotension: Yes Has patient had a PCN reaction causing severe rash involving mucus membranes or skin necrosis: No Has patient had a PCN reaction that required hospitalization Yes Has patient had a PCN reaction occurring within the last 10 years: No If all of the above answers are "NO", then may proceed with Cephalosporin use.   . Diflucan [Fluconazole] Nausea Only    heartburn   Review of Systems   Pertinent items are noted in the HPI. Otherwise,  ROS is negative.  Vitals:   Vitals:   10/27/17 1427  BP: 120/88  Pulse: 82  Temp: 98.4 F (36.9 C)  TempSrc: Oral  SpO2: 96%  Weight: 218 lb 9.6 oz (99.2 kg)  Height: 5\' 4"  (1.626 m)     Body mass index is 37.52 kg/m.   Physical Exam:   Physical Exam  Constitutional: She appears well-developed and well-nourished. No distress.  HENT:  Head: Normocephalic and atraumatic.  Eyes: Pupils are equal, round, and reactive to light. EOM are normal.  Neck: Normal range of motion. Neck supple.  Cardiovascular: Normal rate, regular rhythm, normal  heart sounds and intact distal pulses.  Pulmonary/Chest: Effort normal.    Abdominal: Soft.  Skin: Skin is warm. Rash noted.  Bilateral great toe rash.  Psychiatric: She has a normal mood and affect. Her behavior is normal.  Nursing note and vitals reviewed.  Assessment and Plan:   [name] was seen today for follow-up.  Diagnoses and all orders for this visit:  Encounter for screening for HIV -     HIV antibody  Insomnia, unspecified type Comments: PRN use.  Orders: -     zolpidem (AMBIEN) 10 MG tablet; Take 1 tablet (10 mg total) by mouth at bedtime as needed. Take 1/2 to 1 tab po qhs prn  Left breast mass -     US BREAST LTD UNI LEFT INC AXILLA; Future -     US BREAST LTD UNI RIGHT INC AXILLA; Future -     MM DIAG BREAST TOMO UNI LEFT; Future  Contact dermatitis, unspecified contact dermatitis type, unspecified trigger -     triamcinolone (KENALOG) 0.025 % ointment; Apply 1 application topically 2 (two) times daily.  Morbid obesity ([place]) Comments: Doing well. Continue current treatment.  Orders: -     phentermine (ADIPEX-P) 37.5 MG tablet; Take 1 tablet (37.5 mg total) by mouth daily before breakfast.   . Reviewed expectations re: course of current medical issues. . Discussed self-management of symptoms. . Outlined signs and symptoms indicating need for more acute intervention. . Patient verbalized understanding and all questions were answered. Marland Kitchen Health Maintenance issues including appropriate healthy diet, exercise, and smoking avoidance were discussed with patient. . See orders for this visit as documented in the electronic medical record. . Patient received an After Visit Summary.  Briscoe Deutscher, DO [place] [place] 10/27/2017  Future Appointments  Date Time Provider [place]  10/28/2017  8:40 AM [facility] DIAG TOMO 1 [facility] GI-BREAST CE  10/28/2017  8:50 AM [facility] [place] 1 [facility] GI-BREAST CE  11/27/2017  2:00 PM Briscoe Deutscher, DO [facility] PEC    01/12/2018  3:00 PM Pieter Partridge, DO [facility] None  03/03/2018  1:15 PM [name], MD [place] None  06/08/2018  9:00 AM [facility] LAB 5 [facility] None  06/08/2018  9:30 AM Magrinat, Virgie Dad, MD [facility] None

## 2017-10-27 ENCOUNTER — Ambulatory Visit: Payer: BLUE CROSS/BLUE SHIELD | Admitting: Family Medicine

## 2017-10-27 ENCOUNTER — Encounter: Payer: Self-pay | Admitting: Family Medicine

## 2017-10-27 VITALS — BP 120/88 | HR 82 | Temp 98.4°F | Ht 64.0 in | Wt 218.6 lb

## 2017-10-27 DIAGNOSIS — Z114 Encounter for screening for human immunodeficiency virus [HIV]: Secondary | ICD-10-CM | POA: Diagnosis not present

## 2017-10-27 DIAGNOSIS — G4700 Insomnia, unspecified: Secondary | ICD-10-CM

## 2017-10-27 DIAGNOSIS — N6320 Unspecified lump in the left breast, unspecified quadrant: Secondary | ICD-10-CM | POA: Diagnosis not present

## 2017-10-27 DIAGNOSIS — L259 Unspecified contact dermatitis, unspecified cause: Secondary | ICD-10-CM | POA: Diagnosis not present

## 2017-10-27 MED ORDER — TRIAMCINOLONE ACETONIDE 0.025 % EX OINT: 1.0000 "application " | TOPICAL_OINTMENT | Freq: Two times a day (BID) | CUTANEOUS | 0 refills | Status: DC

## 2017-10-27 MED ORDER — PHENTERMINE HCL 37.5 MG PO TABS: 37.5000 mg | ORAL_TABLET | Freq: Every day | ORAL | 0 refills | Status: DC

## 2017-10-27 MED ORDER — ZOLPIDEM TARTRATE 10 MG PO TABS: 10.0000 mg | ORAL_TABLET | Freq: Every evening | ORAL | 1 refills | Status: DC | PRN

## 2017-10-28 ENCOUNTER — Ambulatory Visit
Admission: RE | Admit: 2017-10-28 | Discharge: 2017-10-28 | Disposition: A | Discharge status: Home / Self Care | Payer: BLUE CROSS/BLUE SHIELD | Source: Ambulatory Visit | Attending: Family Medicine | Admitting: Family Medicine

## 2017-10-28 DIAGNOSIS — N6320 Unspecified lump in the left breast, unspecified quadrant: Secondary | ICD-10-CM

## 2017-10-29 ENCOUNTER — Encounter: Payer: Self-pay | Admitting: Neurology

## 2017-11-03 ENCOUNTER — Encounter: Payer: Self-pay | Admitting: Family Medicine

## 2017-11-03 ENCOUNTER — Other Ambulatory Visit: Payer: Self-pay | Admitting: Family Medicine

## 2017-11-03 DIAGNOSIS — G4700 Insomnia, unspecified: Secondary | ICD-10-CM

## 2017-11-03 NOTE — Telephone Encounter (Signed)
Copied from [place] [id number]. Topic: Quick Communication - Rx Refill/Question >> Nov 03, 2017  3:06 PM Synthia Innocent wrote: Medication: zolpidem (AMBIEN) 10 MG tablet  Has the patient contacted their pharmacy? Yes.   (Agent: If no, request that the patient contact the pharmacy for the refill.) Preferred Pharmacy (with phone number or street name):[facility] [place]: Please be advised that RX refills may take up to 3 business days. We ask that you follow-up with your pharmacy.

## 2017-11-04 ENCOUNTER — Telehealth: Payer: Self-pay | Admitting: Family Medicine

## 2017-11-04 ENCOUNTER — Other Ambulatory Visit: Payer: Self-pay | Admitting: Family Medicine

## 2017-11-04 NOTE — Telephone Encounter (Signed)
Copied from [place] [id number]. Topic: General - Other >> Nov 04, 2017  3:01 PM Yvette Rack wrote: Reason for CRM: patient calling back about her Ambien refill that the pharmacy never received

## 2017-11-05 NOTE — Telephone Encounter (Signed)
Pt has called the pharmacy again at 12:00. They still do not have the medication. She is requesting someone from the office call the pharmacy and call it in because she really needs this medication.

## 2017-11-05 NOTE — Telephone Encounter (Signed)
Script resent. Called patient to see if she received no answer no v/m

## 2017-11-05 NOTE — Telephone Encounter (Signed)
Called pharmacy never received script verbal given. Called patient and let her know.

## 2017-11-07 IMAGING — DX DG CERVICAL SPINE 2 OR 3 VIEWS
2 series · 2 of 2 positions shown · non-contrast
Comparison: 04/29/2014

CLINICAL DATA: Posterior neck and thoracic pain radiating down the
right arm for 4 days.

EXAM:
CERVICAL SPINE - 2-3 VIEW

[c-spine lat]
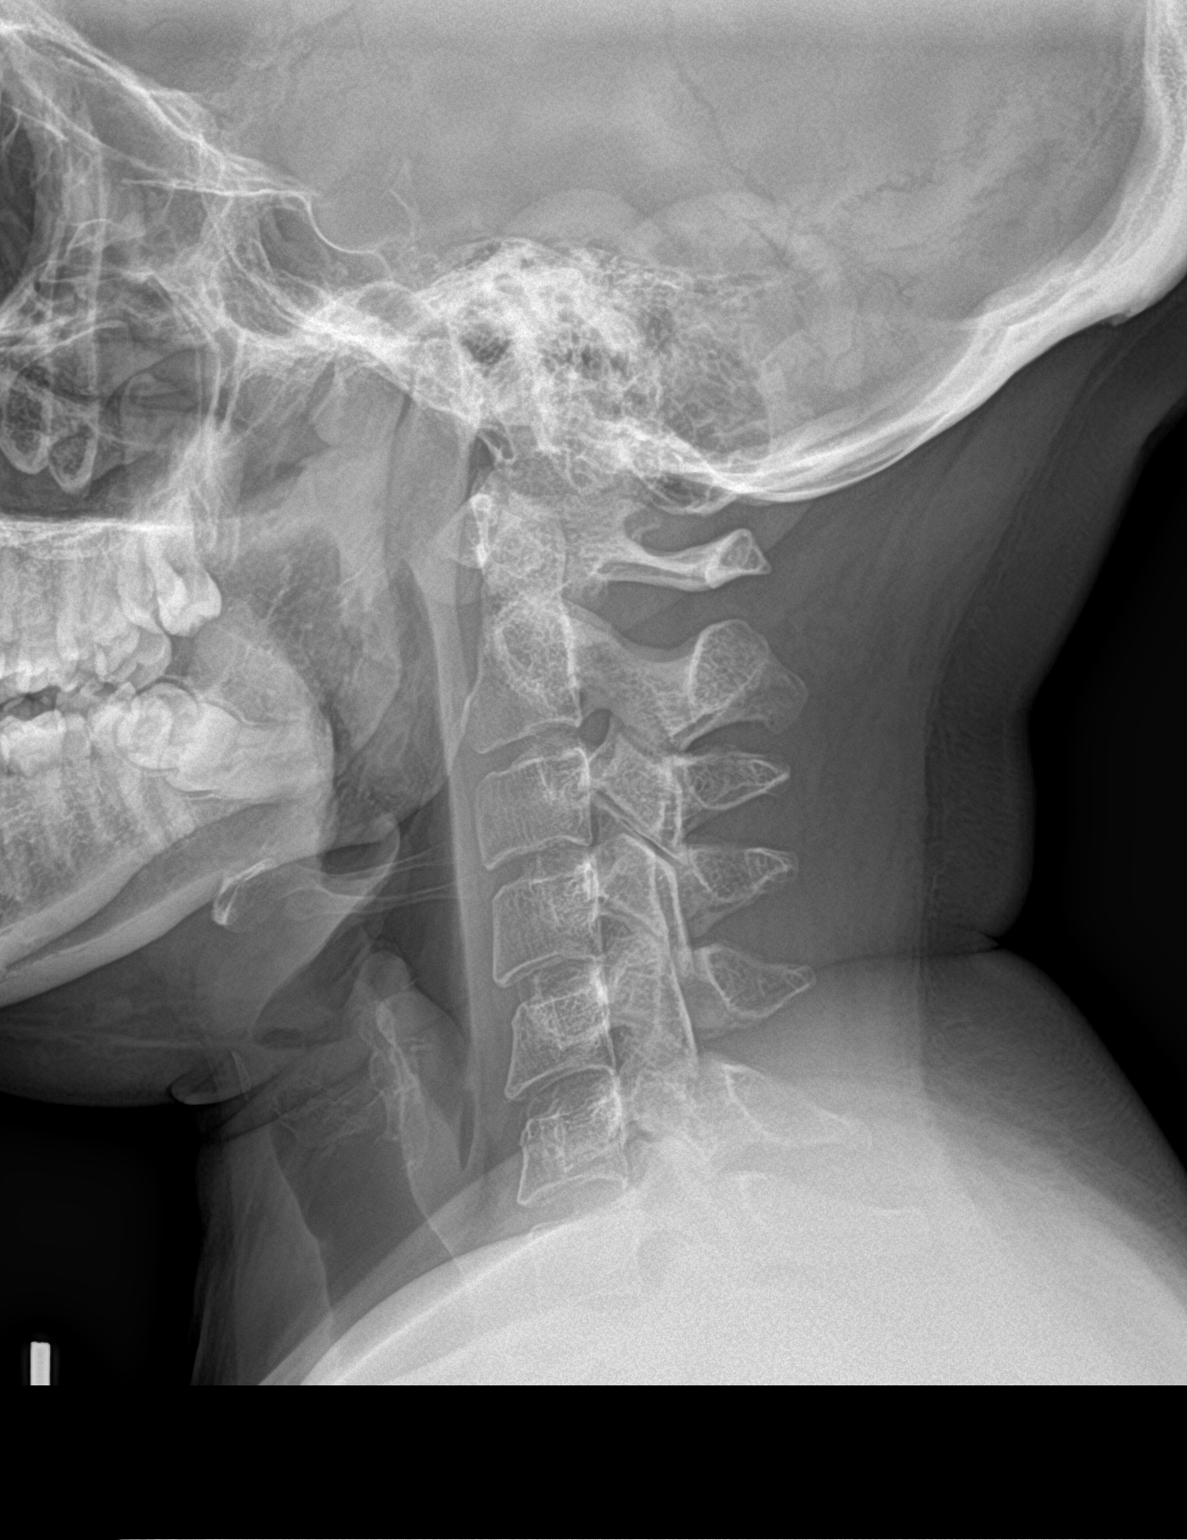

[c-spine ap]
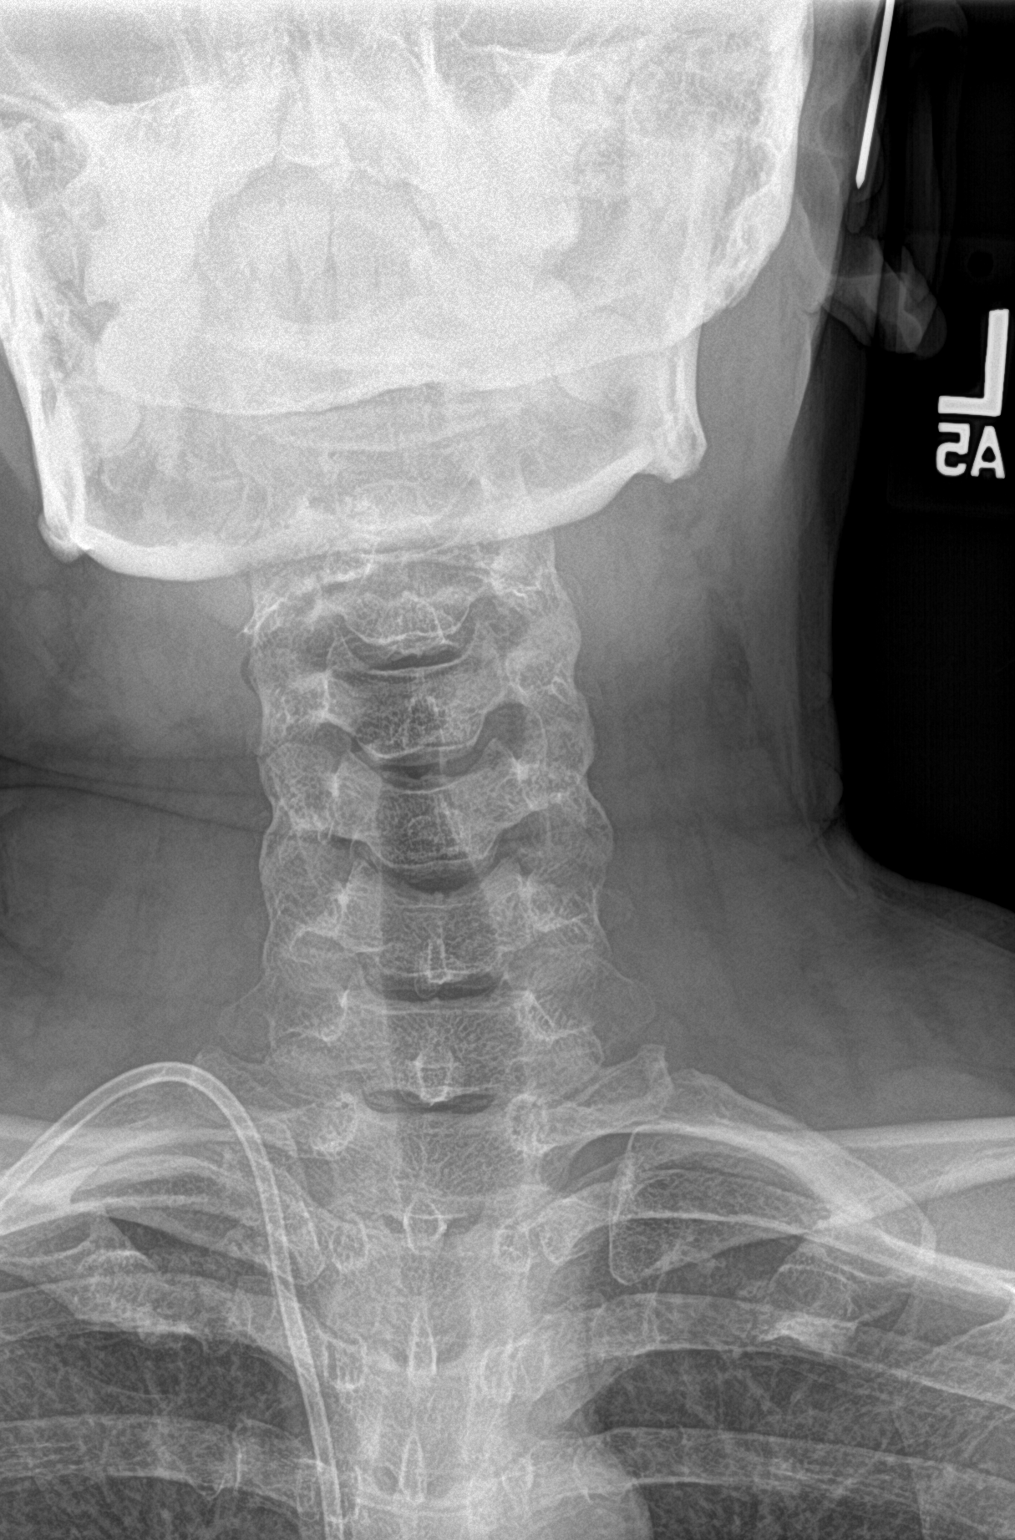

[2 of 2 positions shown; findings below may reference images not displayed]

FINDINGS: There is unchanged cervical spine straightening. No listhesis.
Prevertebral soft tissues are within normal limits. No cervical
spine fracture is identified. Intervertebral disc space heights are
preserved without significant degenerative change identified. Right
jugular Port-A-Cath is partially visualized.
IMPRESSION: No evidence of acute osseous abnormality or significant degenerative
change in the cervical spine.

## 2017-11-07 IMAGING — DX DG THORACIC SPINE 2V
3 series · 3 of 3 positions shown · non-contrast
Comparison: None.

CLINICAL DATA: Acute thoracic spine pain without known injury.

EXAM:
THORACIC SPINE 2 VIEWS

[t-spine lat]
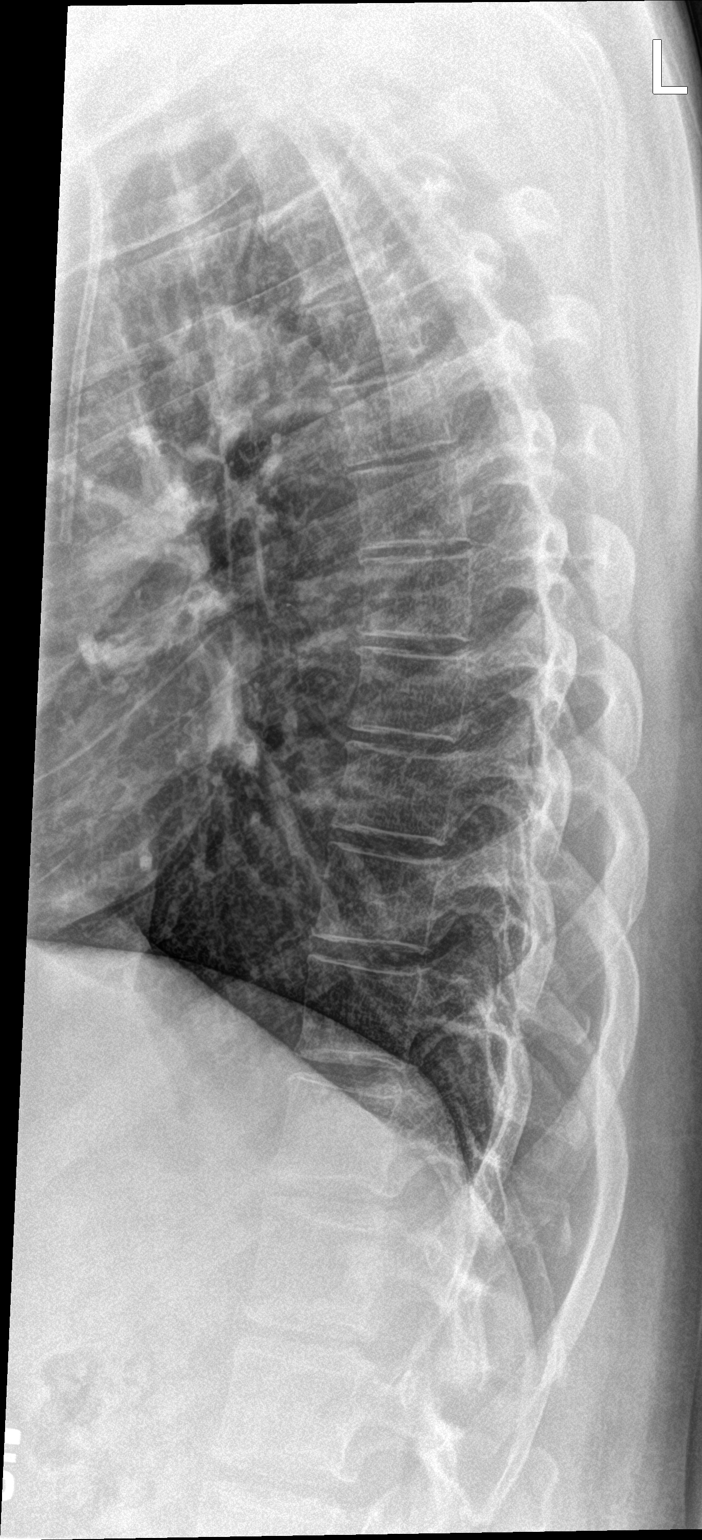

[t-spine swimmers]
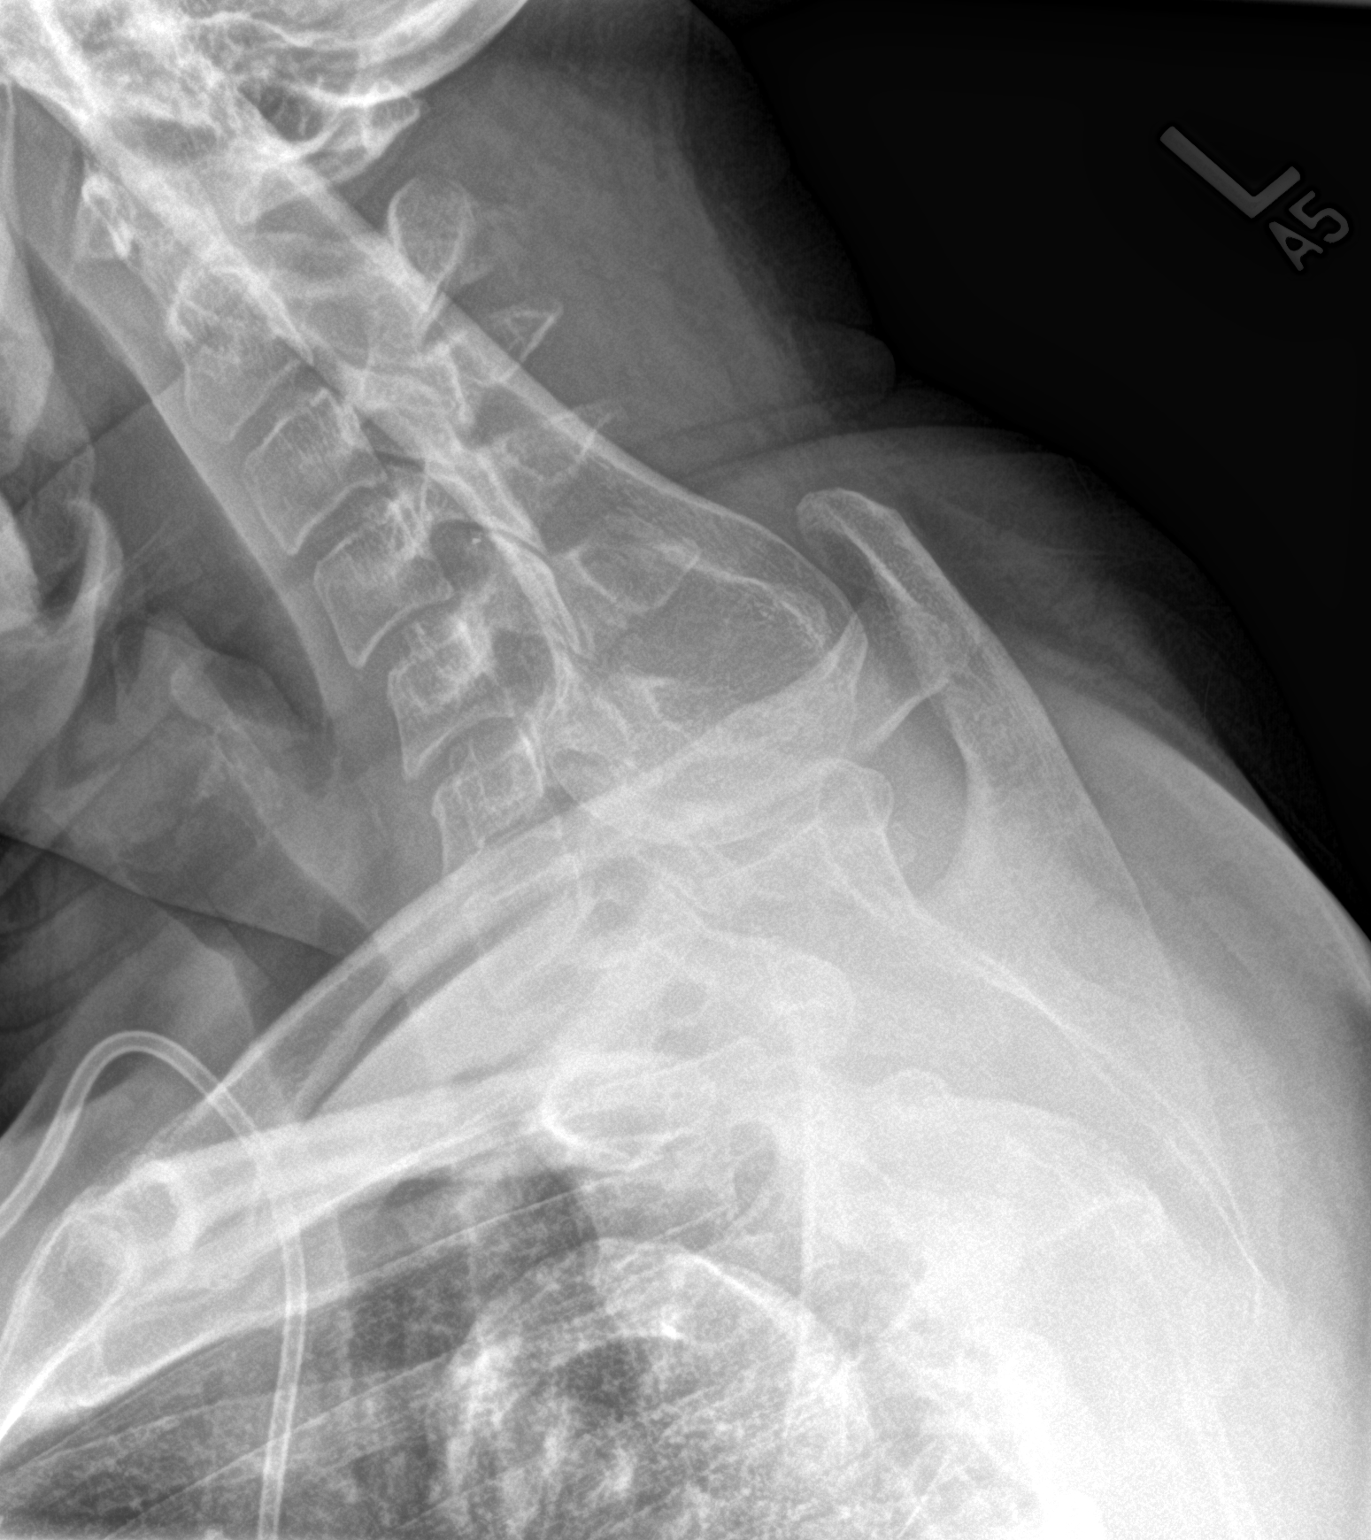

[t-spine ap]
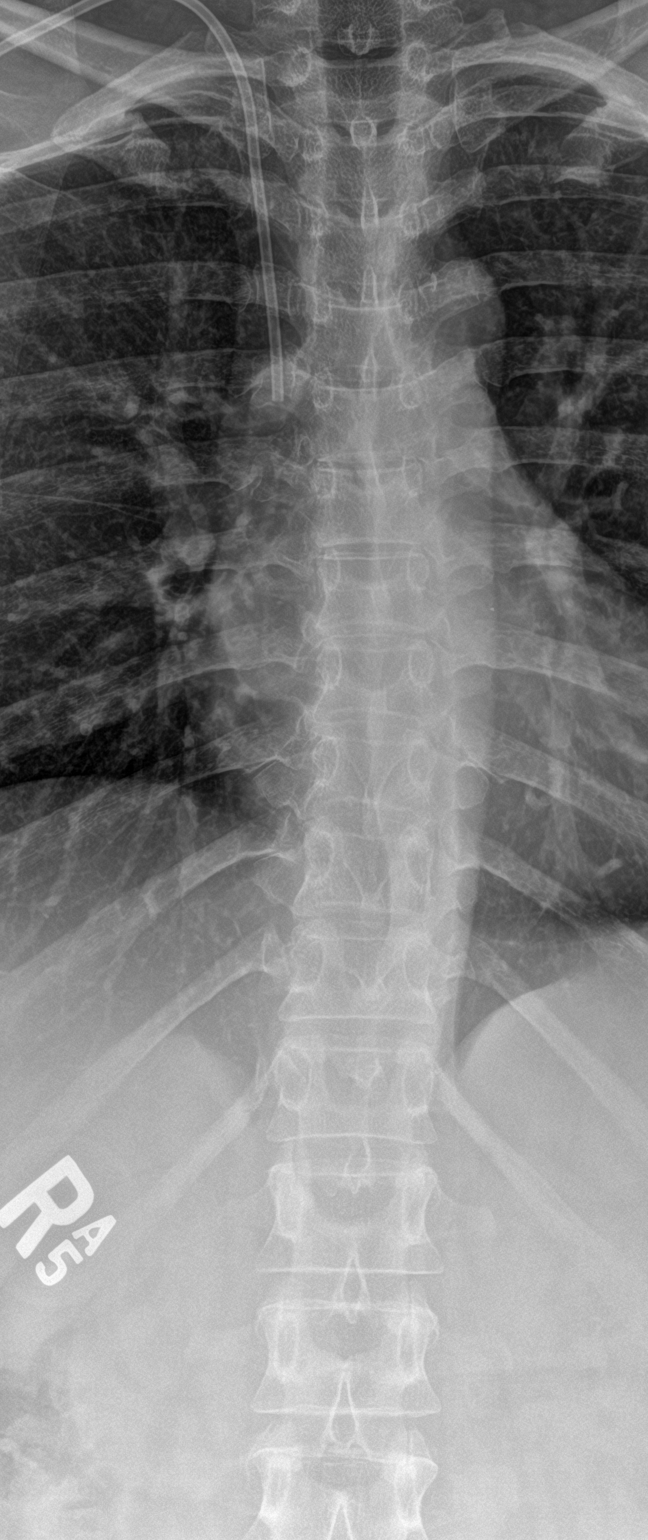

[3 of 3 positions shown; findings below may reference images not displayed]

FINDINGS: There is no evidence of thoracic spine fracture. Alignment is
normal. No other significant bone abnormalities are identified.
IMPRESSION: Normal thoracic spine.

## 2017-11-07 IMAGING — DX DG LUMBAR SPINE 2-3V
3 series · 3 of 3 positions shown · non-contrast
Comparison: None.

CLINICAL DATA: Acute lower back and right leg pain for 4 days
without known injury.

EXAM:
LUMBAR SPINE - 2-3 VIEW

[l-spine lat]
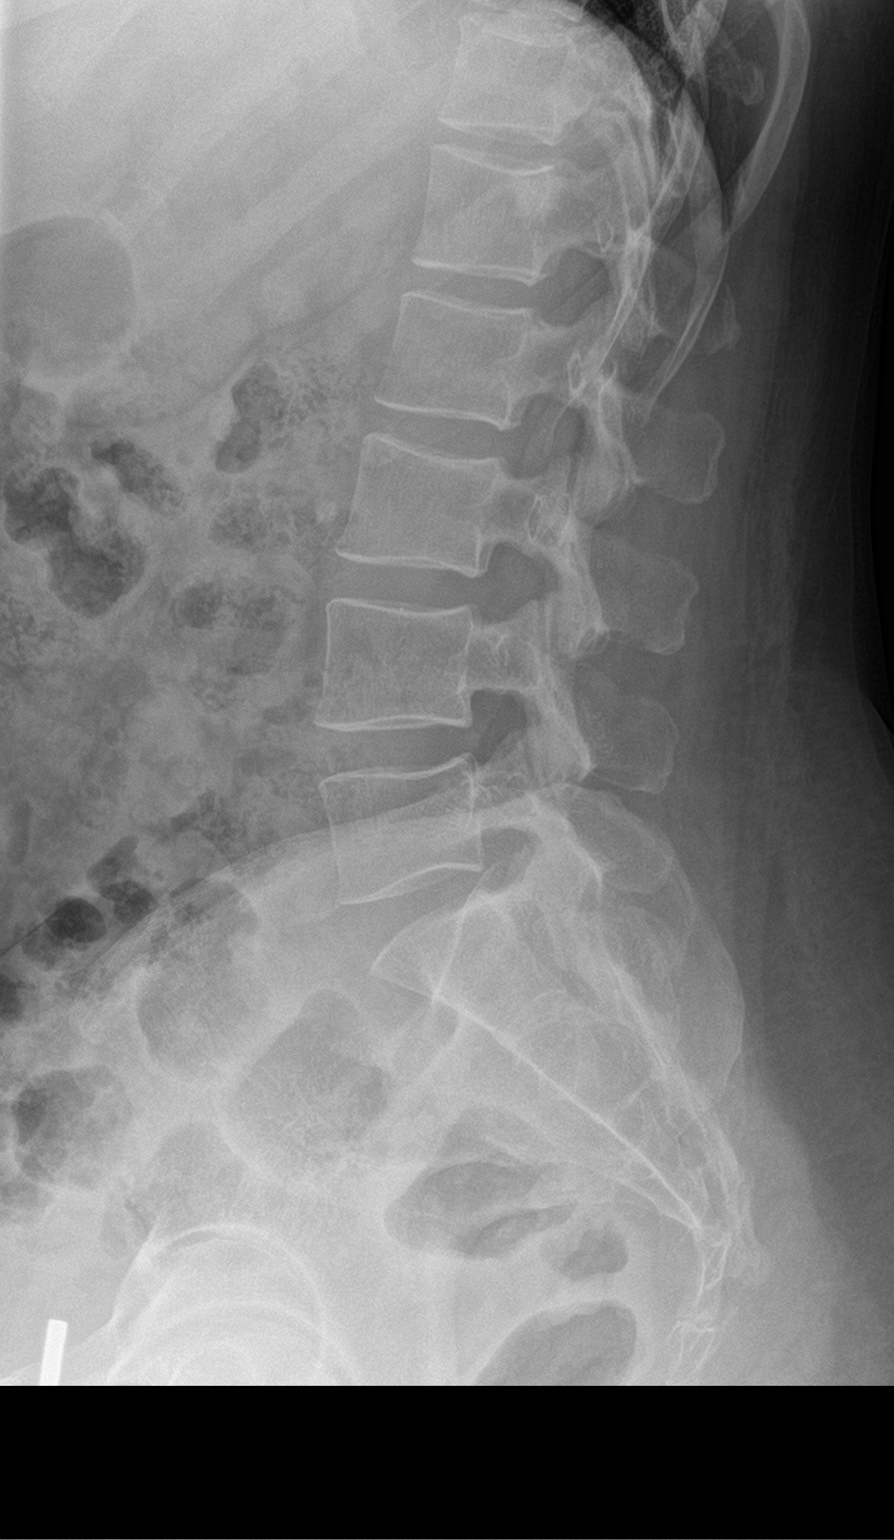

[l-spine spot]
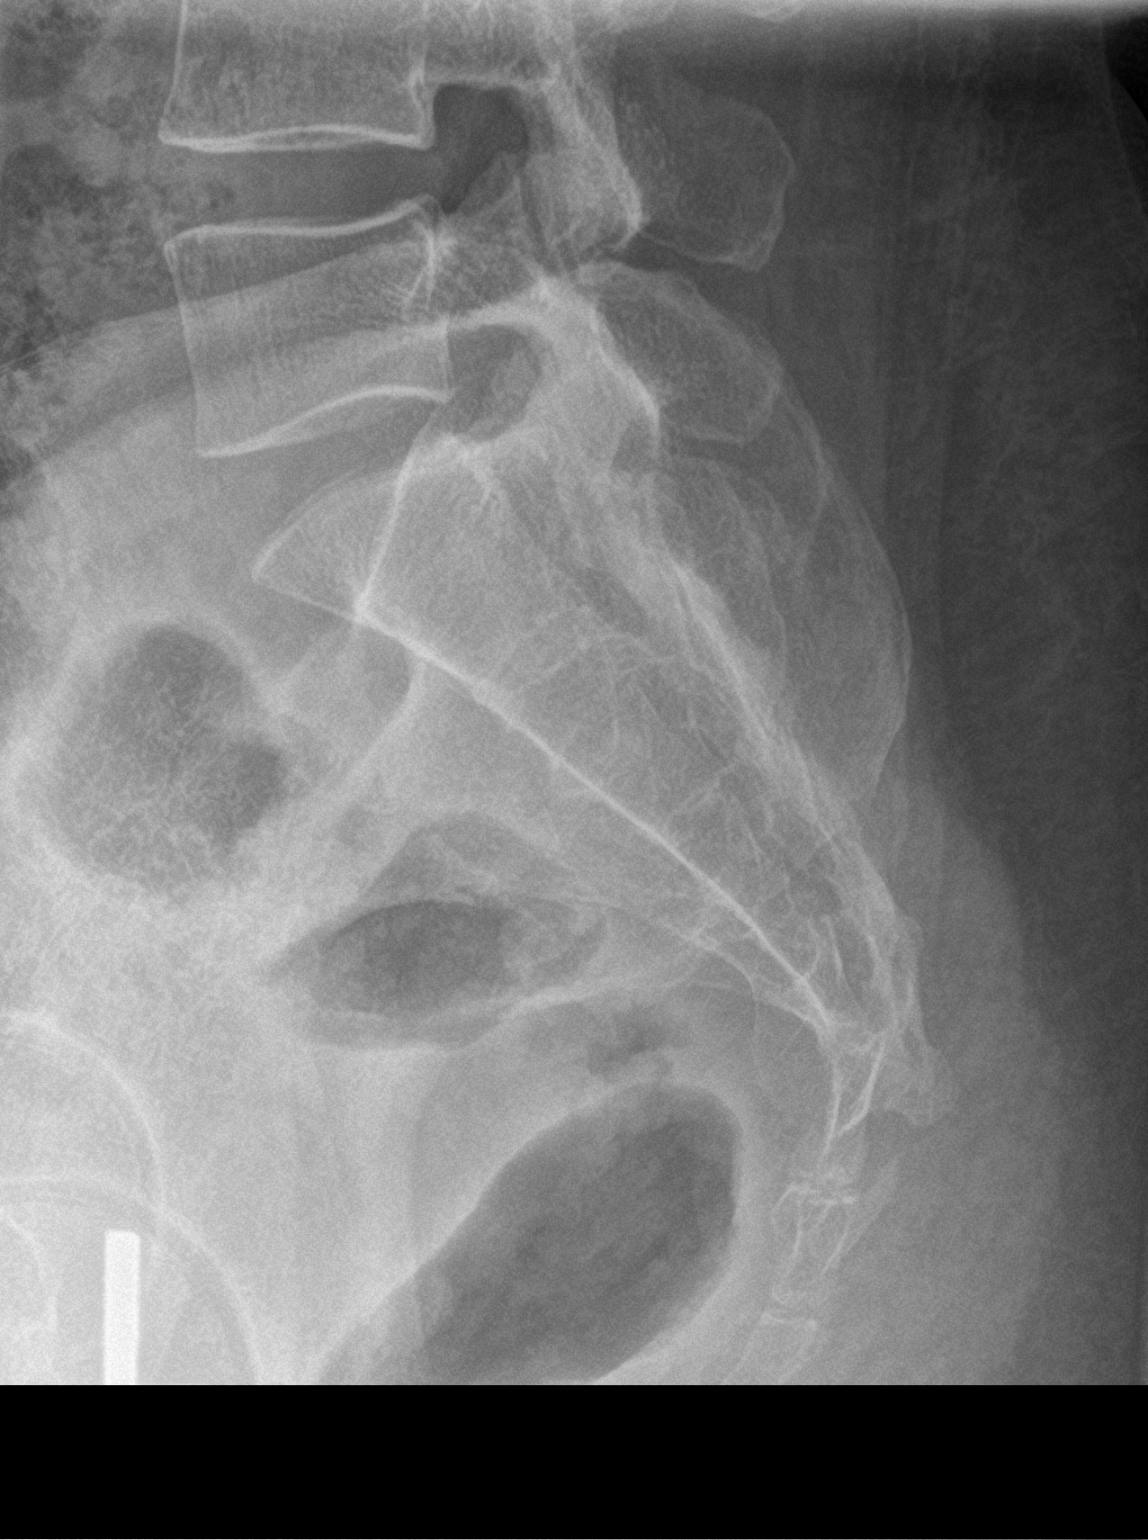

[l-spine ap]
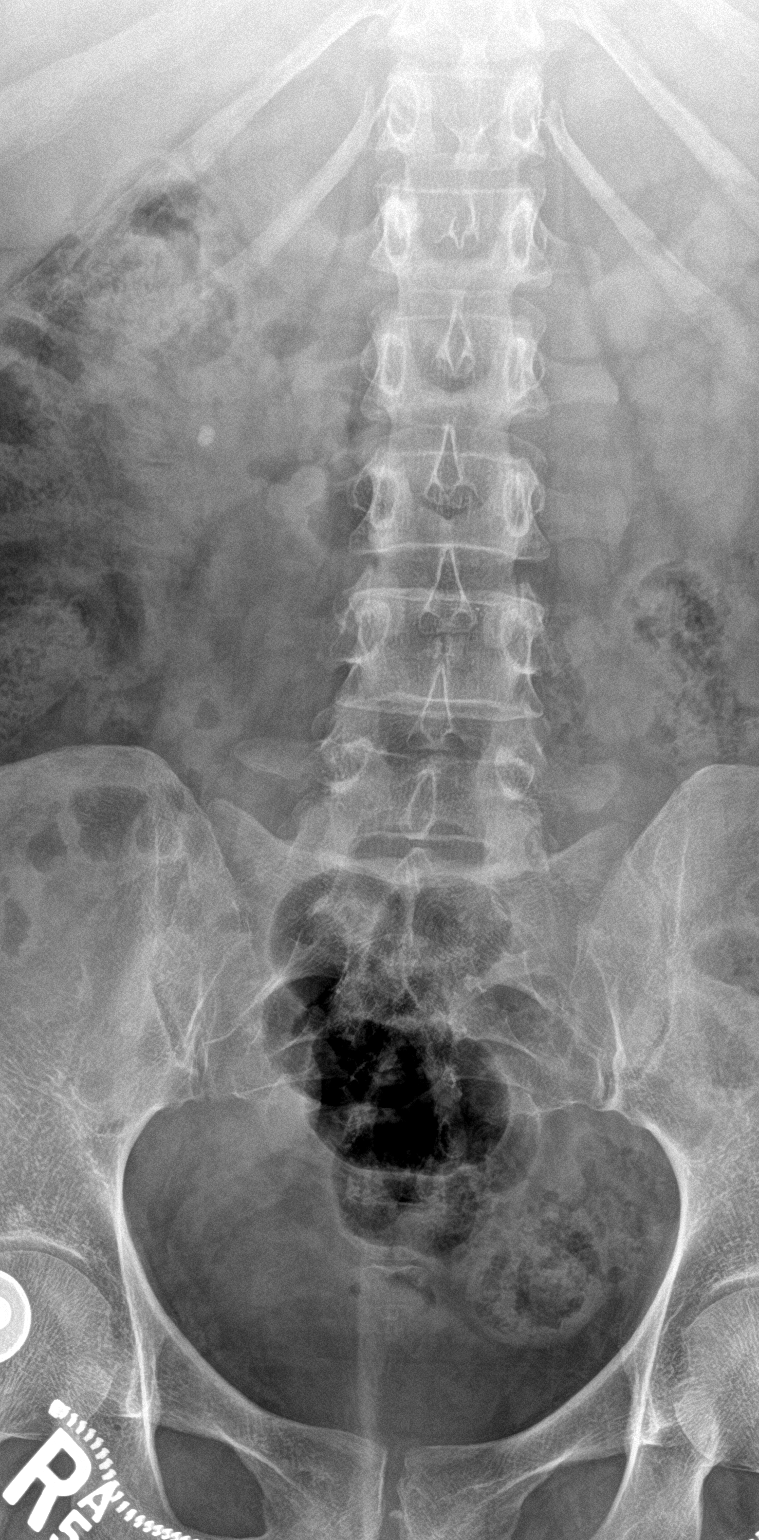

[3 of 3 positions shown; findings below may reference images not displayed]

FINDINGS: There is no evidence of lumbar spine fracture. Alignment is normal.
Intervertebral disc spaces are maintained. Right renal calculus is
noted.
IMPRESSION: No abnormality seen in the lumbar spine. Right renal calculus is
noted.

## 2017-11-14 IMAGING — RF DG ESOPHAGUS
14 of 18 series · 14 of 18 positions shown · non-contrast
Comparison: None.

CLINICAL DATA: Dysphagia. Burning sensation with dry cough and
occasional food regurgitation.

EXAM:
ESOPHOGRAM/BARIUM SWALLOW
TECHNIQUE: Combined double contrast and single contrast examination performed
using effervescent crystals, thick barium liquid, and thin barium
liquid.
FLUOROSCOPY TIME:  Fluoroscopy Time:  1 minutes 7 seconds
Radiation Exposure Index (if provided by the fluoroscopic device):
7.5 mGy
Number of Acquired Spot Images: 0

[Series 1: run · 1 of 1 slices shown (1 of 14)]
[im 1/1]
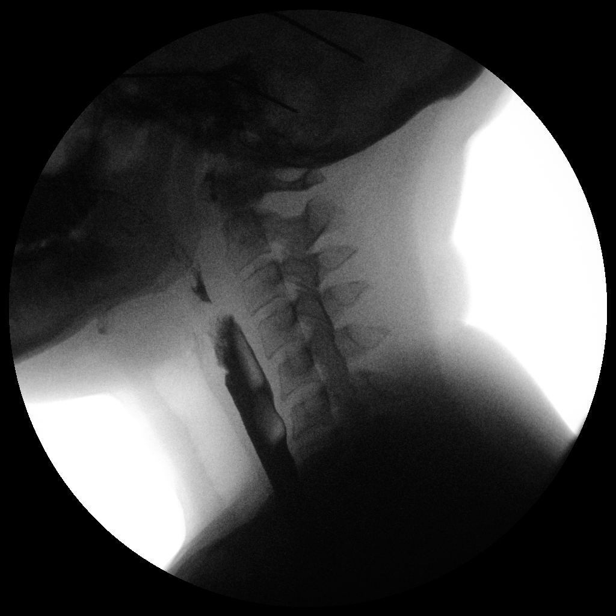

[Series 2: run · 1 of 1 slices shown (2 of 14)]
[im 1/1]
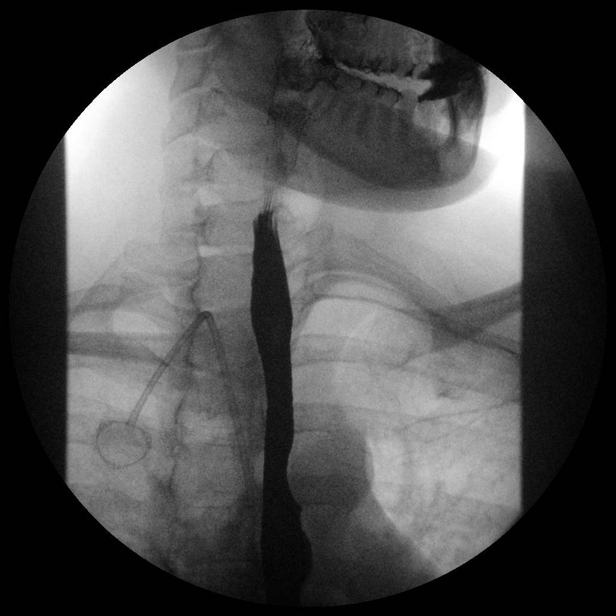

[Series 4: run · 1 of 1 slices shown (3 of 14)]
[im 1/1]
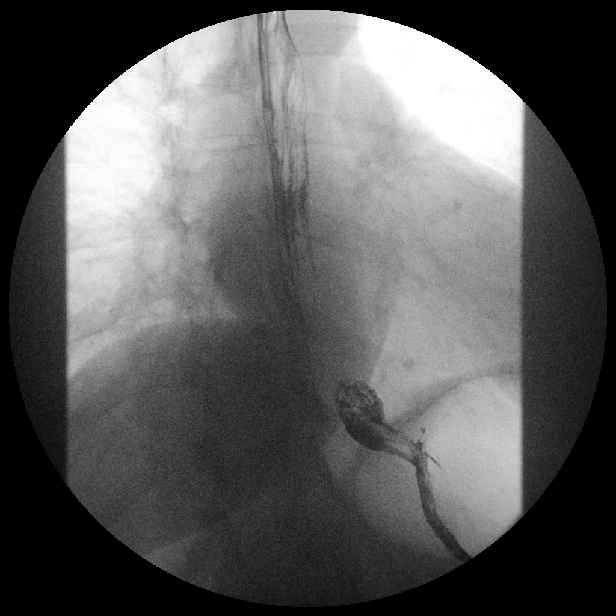

[Series 5: run · 1 of 1 slices shown (4 of 14)]
[im 1/1]
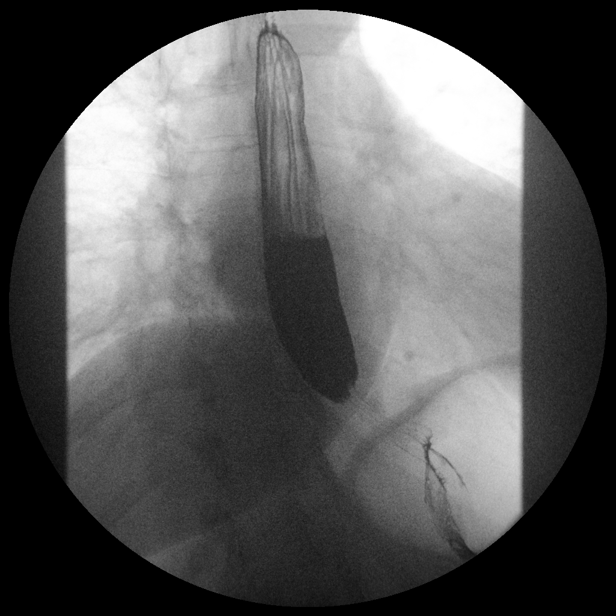

[Series 6: run · 1 of 1 slices shown (5 of 14)]
[im 1/1]
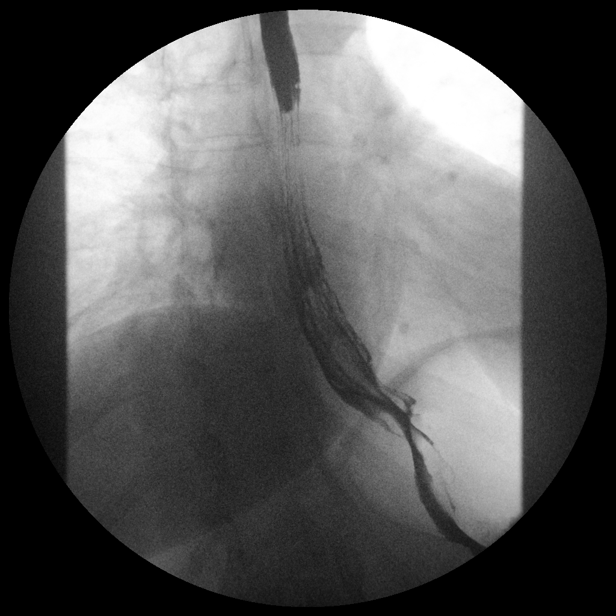

[Series 8: run · 1 of 1 slices shown (6 of 14)]
[im 1/1]
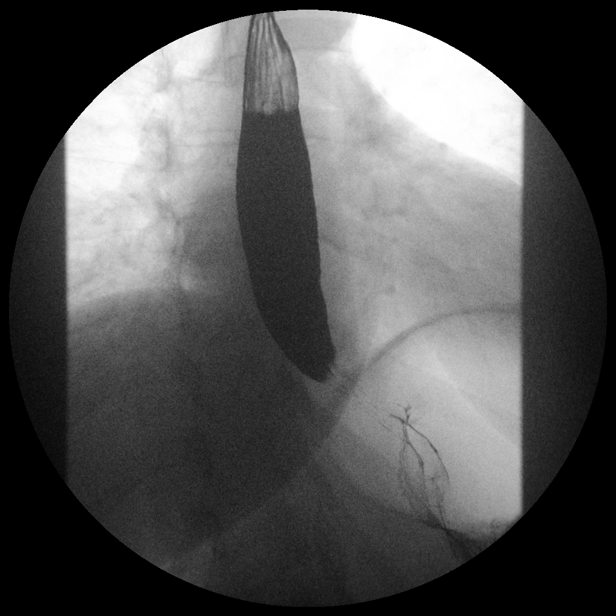

[Series 9: run · 1 of 1 slices shown (7 of 14)]
[im 1/1]
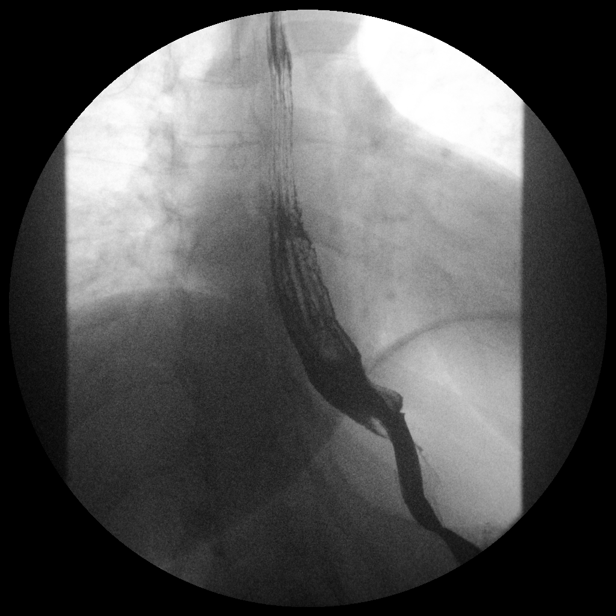

[Series 10: run · 1 of 1 slices shown (8 of 14)]
[im 1/1]
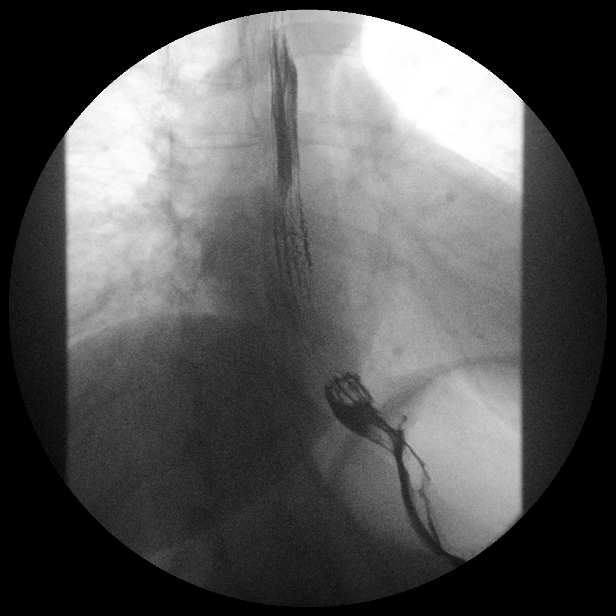

[Series 11: run · 1 of 1 slices shown (9 of 14)]
[im 1/1]
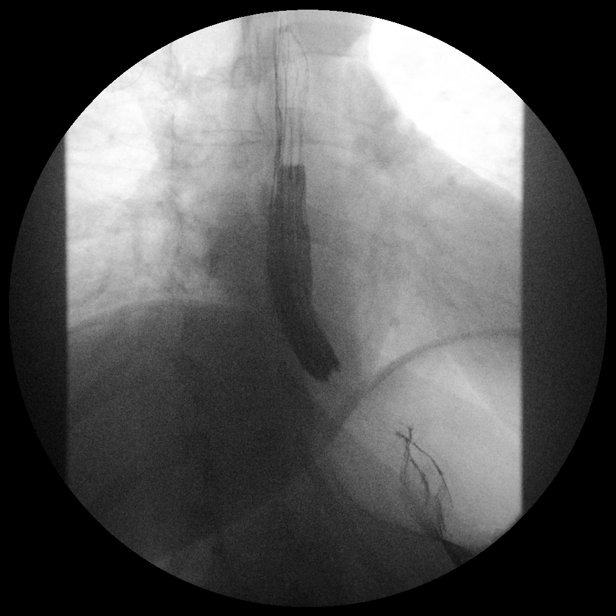

[Series 13: run · 1 of 1 slices shown (10 of 14)]
[im 1/1]
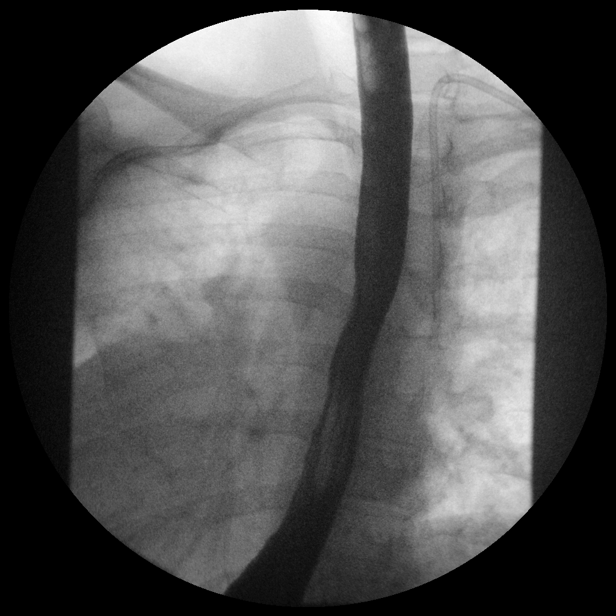

[Series 14: run · 1 of 1 slices shown (11 of 14)]
[im 1/1]
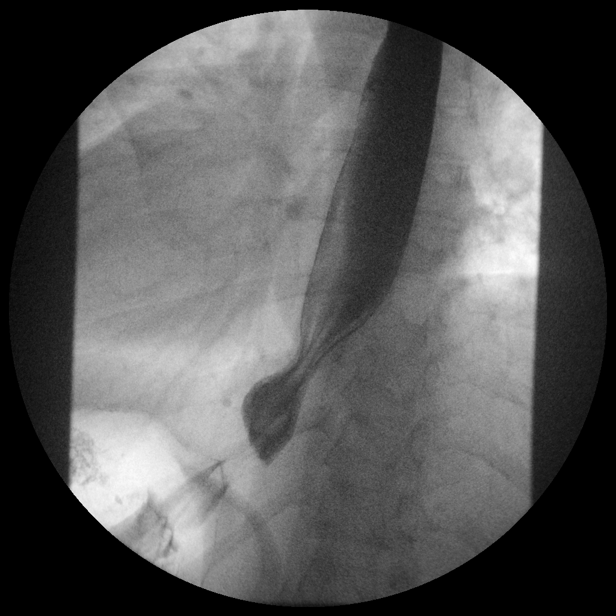

[Series 15: run · 1 of 1 slices shown (12 of 14)]
[im 1/1]
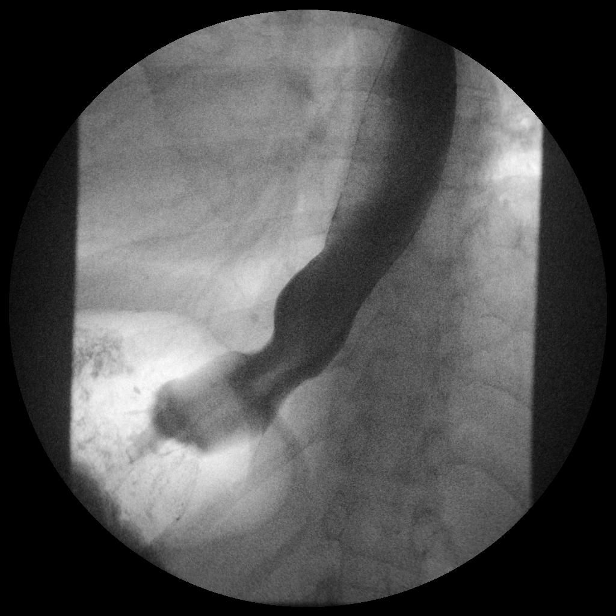

[Series 17: run · 1 of 1 slices shown (13 of 14)]
[im 1/1]
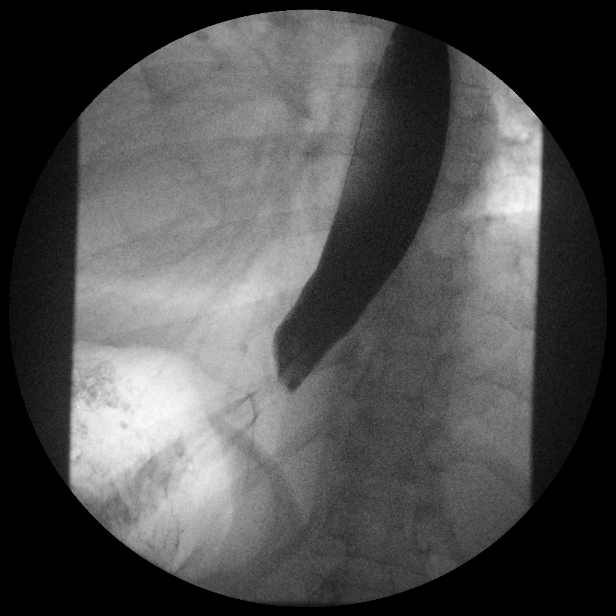

[Series 18: run · 1 of 1 slices shown (14 of 14)]
[im 1/1]
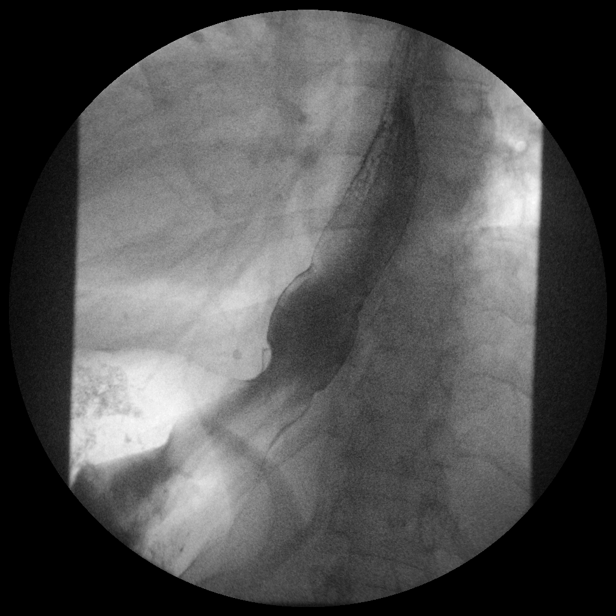

[14 of 18 positions shown; findings below may reference images not displayed]

FINDINGS: Lateral pharyngeal imaging was normal. No diverticulum, obstruction,
or aspiration.

Fold thickening along the lower esophagus with mildly irregular
borders but no stricture, hiatal hernia or generalized reticulation.
This appearance normalized with maximal distention and no fixed mass
was noted. Motility was normal. Gastroesophageal reflux was seen.
IMPRESSION: Mild irregular fold thickening in the lower esophagus that is likely
peptic. No hiatal hernia or stricture.

## 2017-11-16 ENCOUNTER — Other Ambulatory Visit: Payer: Self-pay | Admitting: Family Medicine

## 2017-11-16 DIAGNOSIS — E876 Hypokalemia: Secondary | ICD-10-CM

## 2017-11-16 DIAGNOSIS — I10 Essential (primary) hypertension: Secondary | ICD-10-CM

## 2017-11-18 IMAGING — DX DG CHEST 2V
2 series · 2 of 2 positions shown · non-contrast
Comparison: 12/25/2015.

CLINICAL DATA: Cough.  Shortness of breath .

EXAM:
CHEST  2 VIEW

[chest pa]
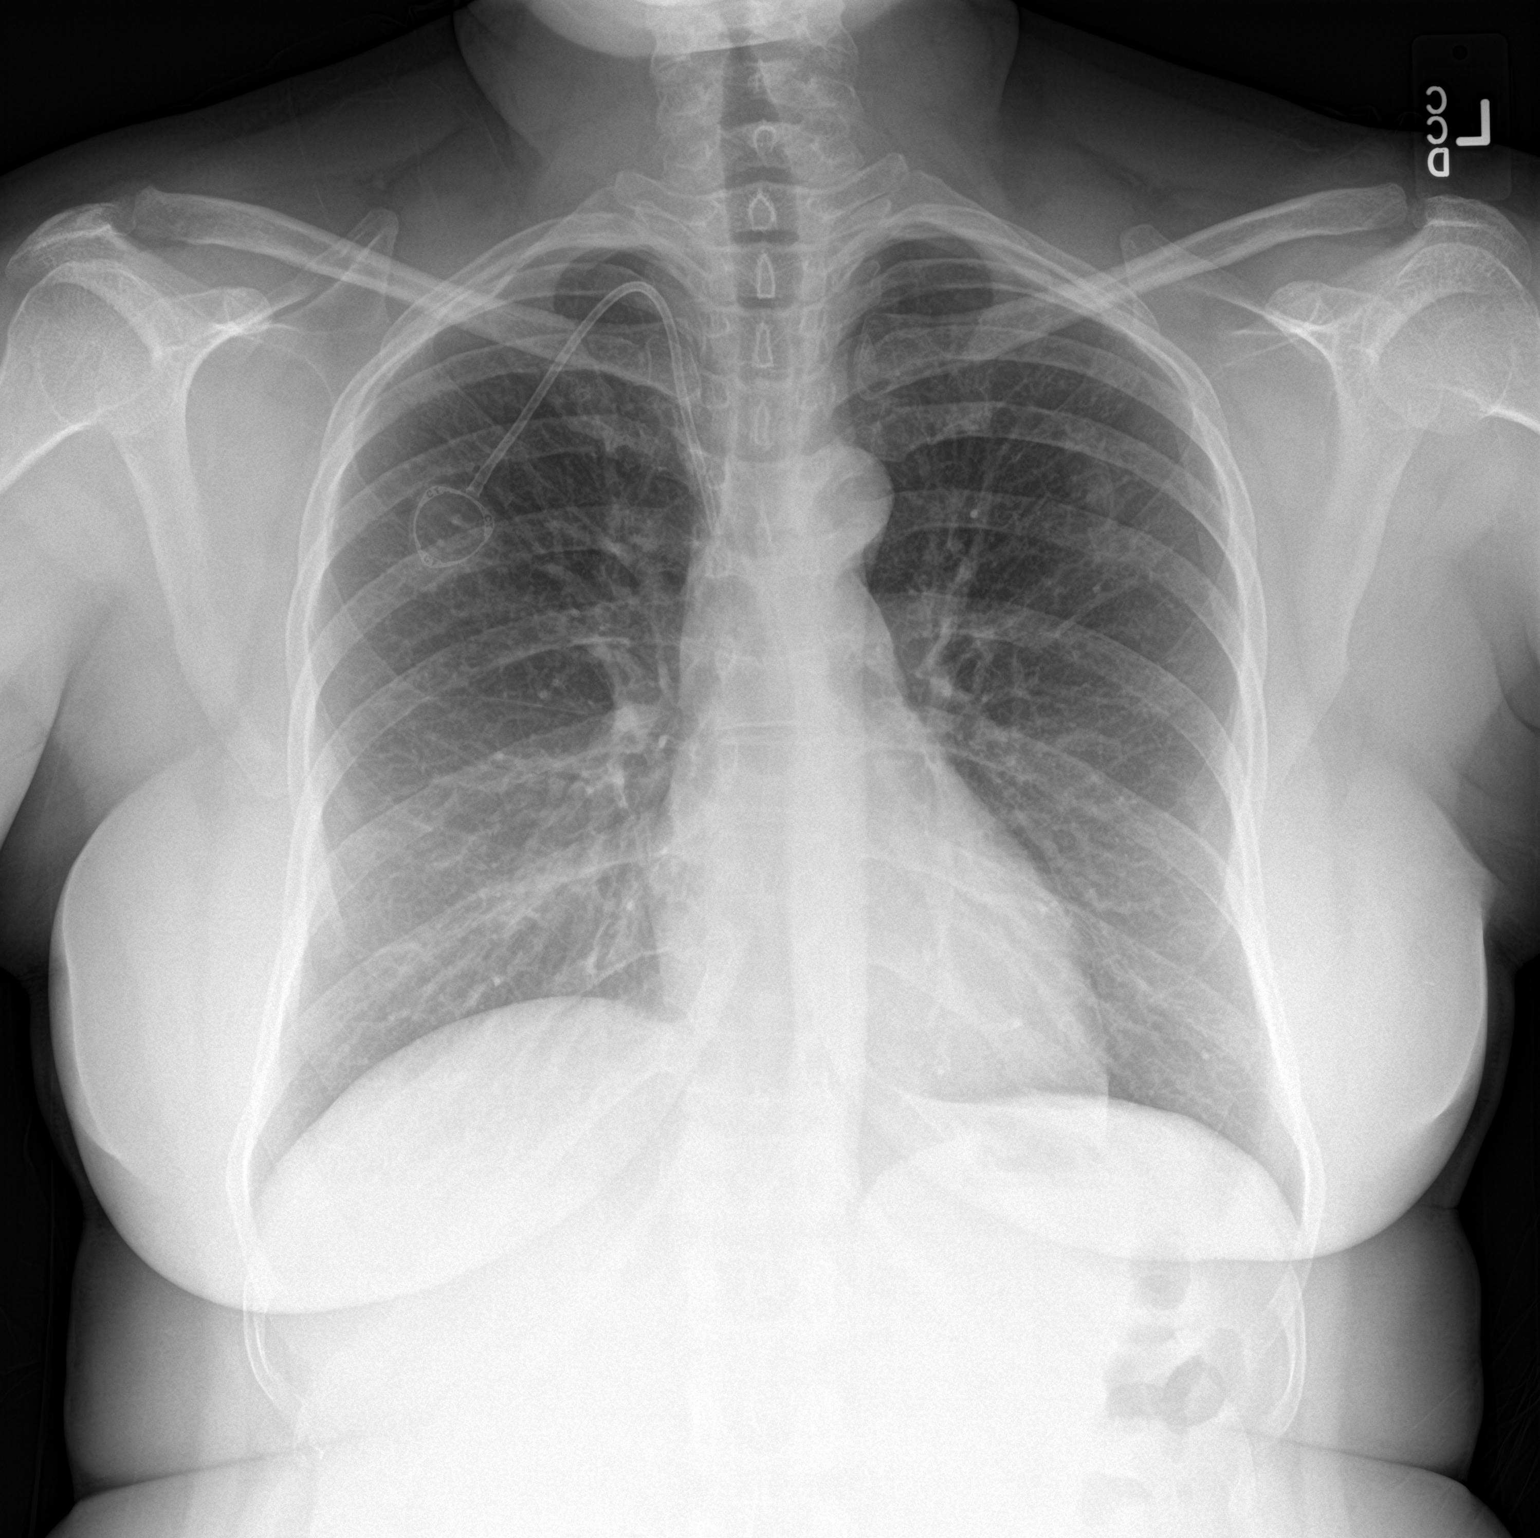

[chest lat]
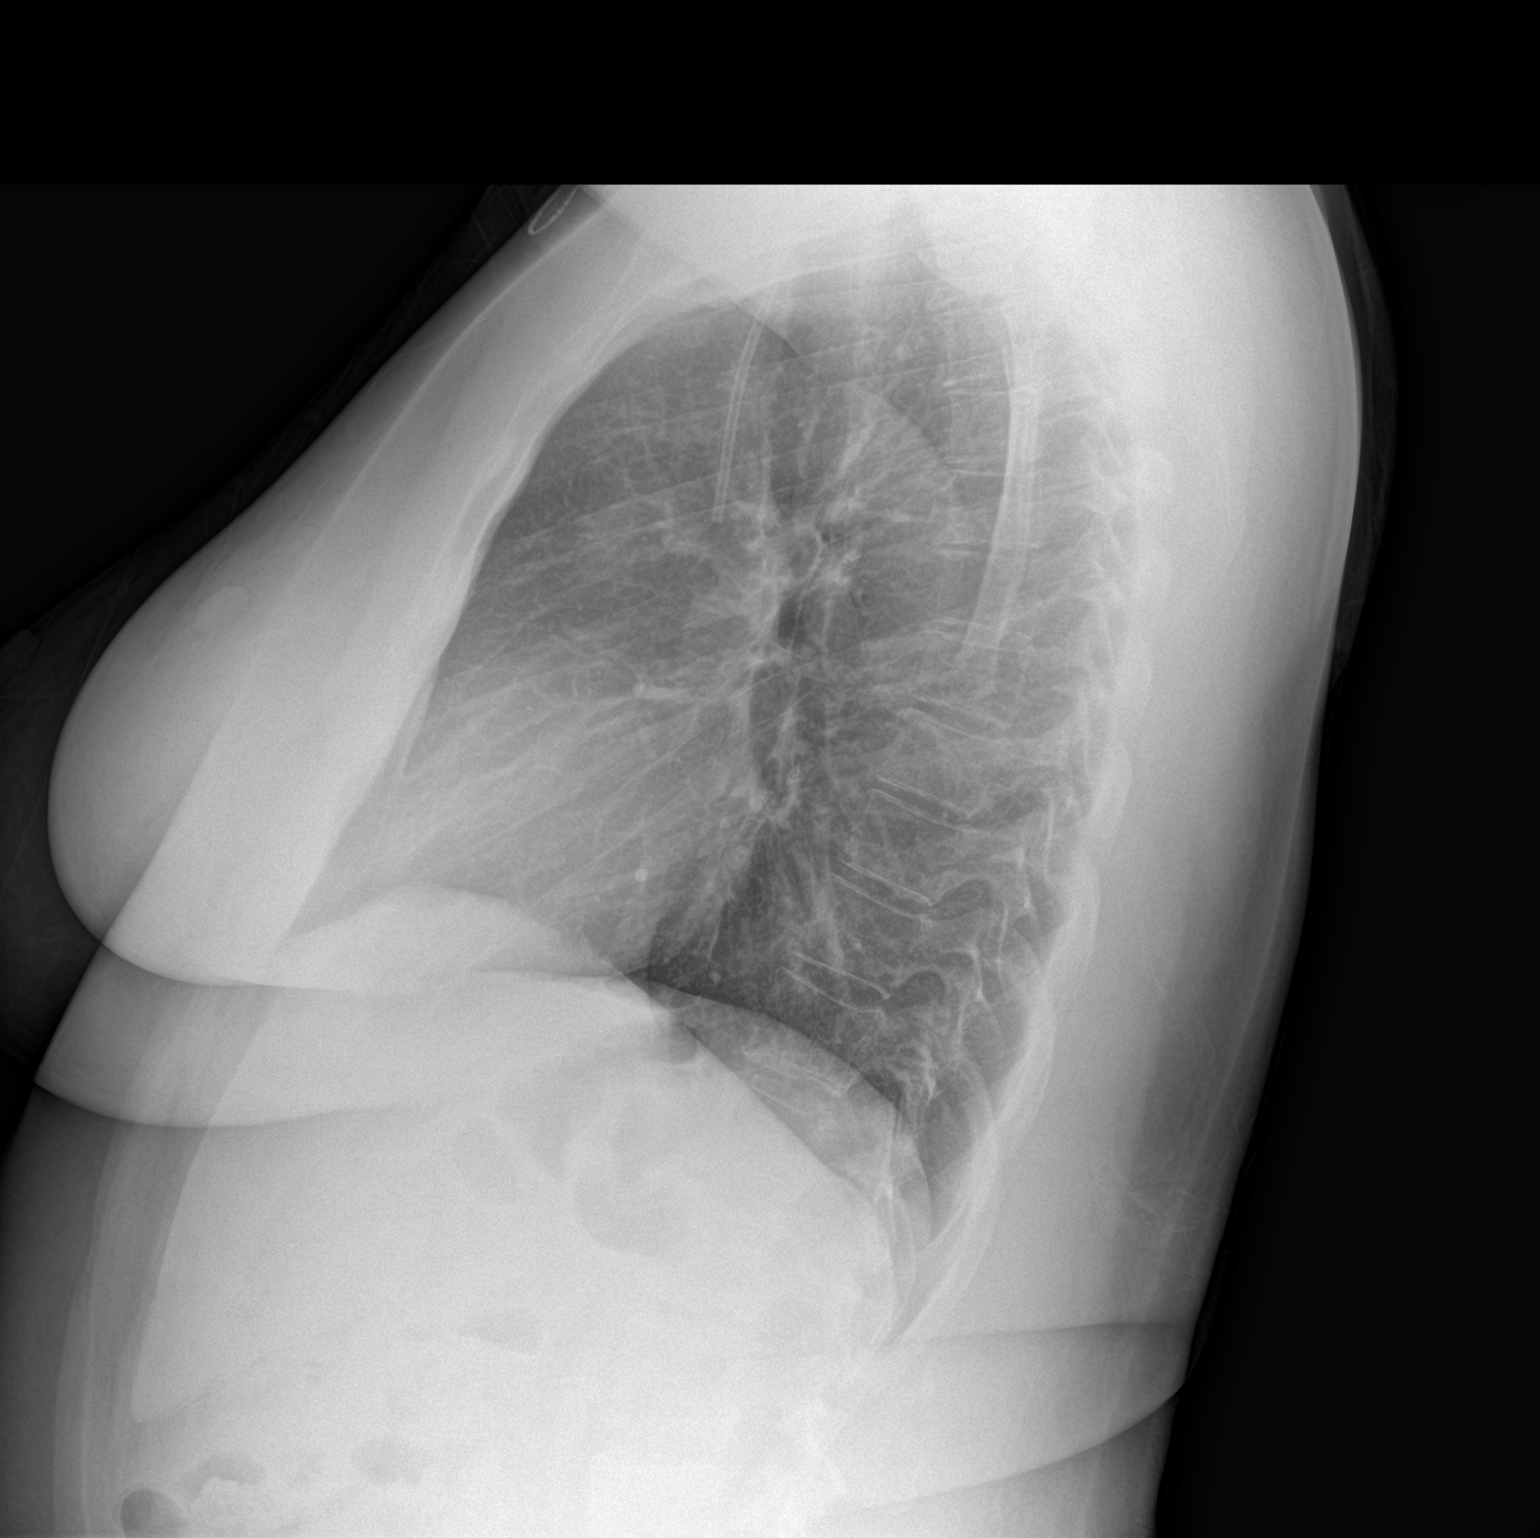

[2 of 2 positions shown; findings below may reference images not displayed]

FINDINGS: PowerPort catheter noted with tip projected over the superior vena
cava. Heart size normal. Left apical mild infiltrate. No pleural
effusion or pneumothorax. No acute bony abnormality .
IMPRESSION: 1. PowerPort catheter noted with tip projected over superior vena
cava.

2. Left apical infiltrate. Follow-up chest x-ray suggested to
demonstrate clearing.

## 2017-11-23 ENCOUNTER — Telehealth: Payer: Self-pay | Admitting: Pulmonary Disease

## 2017-11-23 NOTE — Telephone Encounter (Signed)
CPAP 10/20/17 to 11/18/17 >> used on 27 of 30 nights with average 4 hrs 50 min.  Average AHI 0.8 with CPAP 6 cm H2O.   Please let her know that CPAP report shows good control of sleep apnea with current settings.

## 2017-11-24 NOTE — Telephone Encounter (Signed)
Left voice mail on machine for patient to return phone call back regarding results. X1  

## 2017-11-25 NOTE — Telephone Encounter (Signed)
Left voice mail on machine for patient to return phone call back regarding results. X2 

## 2017-11-27 ENCOUNTER — Encounter: Payer: Self-pay | Admitting: Family Medicine

## 2017-11-27 ENCOUNTER — Ambulatory Visit (INDEPENDENT_AMBULATORY_CARE_PROVIDER_SITE_OTHER): Payer: BLUE CROSS/BLUE SHIELD | Admitting: Family Medicine

## 2017-11-27 ENCOUNTER — Ambulatory Visit: Payer: BLUE CROSS/BLUE SHIELD | Admitting: Family Medicine

## 2017-11-27 VITALS — BP 134/88 | HR 108 | Temp 97.8°F | Ht 64.0 in | Wt 210.0 lb

## 2017-11-27 DIAGNOSIS — F329 Major depressive disorder, single episode, unspecified: Secondary | ICD-10-CM | POA: Diagnosis not present

## 2017-11-27 DIAGNOSIS — M2550 Pain in unspecified joint: Secondary | ICD-10-CM | POA: Diagnosis not present

## 2017-11-27 DIAGNOSIS — R4189 Other symptoms and signs involving cognitive functions and awareness: Secondary | ICD-10-CM

## 2017-11-27 MED ORDER — PHENTERMINE HCL 37.5 MG PO TABS: 37.5000 mg | ORAL_TABLET | Freq: Every day | ORAL | 2 refills | Status: DC

## 2017-11-27 NOTE — Progress Notes (Signed)
[name] is a 42 y.o. female is here for follow up.  History of Present Illness:   [name], CMA acting as scribe for Dr. Briscoe Deutscher.   HPI: Patient in for refill and follow up on Phentermine. Not having any problems with medication. Down 10 pounds. Eating smaller portions. Exercising more. Notes less stress eating. Still using CPAP. Will have f/u in a few months with Sleep.  Interested in [place] locally [place]).   Not working due to chronic fibromyalgia pain, memory and word-finding issues, and severe depression. Stable, no SI. Crying spells. Will be seeing Neurology in one month. Good family support.   Health Maintenance Due  Topic Date Due  . HIV Screening  11/18/1988  . TETANUS/TDAP  11/18/1992   No flowsheet data found. PMHx, SurgHx, SocialHx, FamHx, Medications, and Allergies were reviewed in the Visit Navigator and updated as appropriate.   Patient Active Problem List   Diagnosis Date Noted  . OSA (obstructive sleep apnea) 08/15/2017  . Hip flexor tightness, left 08/04/2017  . Neck pain 08/04/2017  . Hypokalemia 07/29/2017  . Vitamin D deficiency 02/19/2017  . Fatigue 01/28/2017  . Polyarthralgia 01/28/2017  . Cognitive change 01/28/2017  . Depression 01/28/2017  . Obesity (BMI 30-39.9) 01/28/2017  . Primary insomnia 01/28/2017  . Gastroesophageal reflux disease without esophagitis 01/28/2017  . Malignant neoplasm of upper-outer quadrant of left breast in female, estrogen receptor positive ([place]) 07/29/2016  . Genetic testing 01/07/2016  . Breast cancer of upper-outer quadrant of left female breast ([place]) 12/11/2015  . Endometriosis 05/12/2014  . History of hysterectomy for benign disease 05/12/2014   Social History   Tobacco Use  . Smoking status: Never Smoker  . Smokeless tobacco: Never Used  Substance Use Topics  . Alcohol use: Yes    Alcohol/week: 0.0 oz    Comment: socially   . Drug use: No   Current Medications  and Allergies:   .  folic acid (FOLVITE) 1 MG tablet, Take 1 mg by mouth daily., Disp: , Rfl: 3 .  methotrexate 50 MG/2ML injection, INJECT 0.8ML WEEKLY INJECTION 30 DAYS, Disp: , Rfl: 2 .  omeprazole (PRILOSEC) 40 MG capsule, Take 1 tablet before breakfast and before dinner., Disp: 180 capsule, Rfl: 3 .  phentermine (ADIPEX-P) 37.5 MG tablet, Take 1 tablet (37.5 mg total) by mouth daily before breakfast., Disp: 30 tablet, Rfl: 0 .  predniSONE (DELTASONE) 10 MG tablet, TAKE 1 TABLET BY MOUTH DAILY FOR 8 WEEKS--- CURRENTLY PER PT IS TAKING HALF TABLET=5MG --- TAKES IN AM, Disp: , Rfl: 0 .  RESTASIS MULTIDOSE 0.05 % ophthalmic emulsion, Place 1 drop into both eyes 2 (two) times daily., Disp: , Rfl: 0 .  sertraline (ZOLOFT) 100 MG tablet, TAKE 1 TABLET BY MOUTH DAILY--- TAKES IN AM, Disp: 30 tablet, Rfl: 3 .  spironolactone (ALDACTONE) 25 MG tablet, TAKE 1 TABLET BY MOUTH EVERY DAY, Disp: 30 tablet, Rfl: 3 .  traMADol (ULTRAM) 50 MG tablet, Take 1 tablet (50 mg total) by mouth every 6 (six) hours as needed., Disp: 60 tablet, Rfl: 0 .  triamcinolone (KENALOG) 0.025 % ointment, Apply 1 application topically 2 (two) times daily., Disp: 30 g, Rfl: 0 .  zolpidem (AMBIEN) 10 MG tablet, Take 1 tablet (10 mg total) by mouth at bedtime as needed. Take 1/2 to 1 tab po qhs prn, Disp: 30 tablet, Rfl: 1   Allergies  Allergen Reactions  . Amoxicillin Shortness Of Breath and Itching    Has patient  had a PCN reaction causing immediate rash, facial/tongue/throat swelling, SOB or lightheadedness with hypotension: Yes Has patient had a PCN reaction causing severe rash involving mucus membranes or skin necrosis: No Has patient had a PCN reaction that required hospitalization Yes Has patient had a PCN reaction occurring within the last 10 years: No If all of the above answers are "NO", then may proceed with Cephalosporin use.   . Diflucan [Fluconazole] Nausea Only    heartburn   Review of Systems   Pertinent  items are noted in the HPI. Otherwise, ROS is negative.  Vitals:   Vitals:   11/27/17 1415  BP: 134/88  Pulse: (!) 108  Temp: 97.8 F (36.6 C)  TempSrc: Oral  SpO2: 97%  Weight: 210 lb (95.3 kg)  Height: 5\' 4"  (1.626 m)     Body mass index is 36.05 kg/m.  Physical Exam:   Physical Exam  Constitutional: She appears well-nourished.  HENT:  Head: Normocephalic and atraumatic.  Eyes: Pupils are equal, round, and reactive to light. EOM are normal.  Neck: Normal range of motion. Neck supple.  Cardiovascular: Normal rate, regular rhythm, normal heart sounds and intact distal pulses.  Pulmonary/Chest: Effort normal.  Abdominal: Soft.  Skin: Skin is warm.  Psychiatric: She has a normal mood and affect. Her behavior is normal.  Nursing note and vitals reviewed.   Assessment and Plan:   [name] was seen today for follow-up.  Diagnoses and all orders for this visit:  Cognitive change  Morbid obesity ([place]) Comments: Doing well with weight loss. Continue treatment and recheck in 3 months. Orders: -     phentermine (ADIPEX-P) 37.5 MG tablet; Take 1 tablet (37.5 mg total) by mouth daily before breakfast.  Reactive depression  Polyarthralgia    . Reviewed expectations re: course of current medical issues. . Discussed self-management of symptoms. . Outlined signs and symptoms indicating need for more acute intervention. . Patient verbalized understanding and all questions were answered. Marland Kitchen Health Maintenance issues including appropriate healthy diet, exercise, and smoking avoidance were discussed with patient. . See orders for this visit as documented in the electronic medical record. . Patient received an After Visit Summary.  Briscoe Deutscher, DO [place], [place] 11/29/2017  Future Appointments  Date Time Provider [place]  01/12/2018  2:50 PM Pieter Partridge, DO [facility] None  03/01/2018  1:40 PM Briscoe Deutscher, DO [facility]  03/03/2018  1:15 PM [name], MD [place] None  06/08/2018  9:00 AM [facility] 5 [facility] None  06/08/2018  9:30 AM Magrinat, Virgie Dad, MD [facility] None

## 2017-12-01 NOTE — Telephone Encounter (Signed)
Called and spoke with patient regarding results.  Informed the patient of results and recommendations today. Pt verbalized understanding and denied any questions or concerns at this time.  Nothing further needed.  

## 2017-12-16 LAB — HEPATIC FUNCTION PANEL
ALT: 22 (ref 7–35)
AST: 14 (ref 13–35)
Alkaline Phosphatase: 88 (ref 25–125)
Bilirubin, Total: 0.2

## 2017-12-16 LAB — BASIC METABOLIC PANEL
BUN: 12 (ref 4–21)
Creatinine: 0.9 (ref 0.5–1.1)
Glucose: 95
Potassium: 4.5 (ref 3.4–5.3)
Sodium: 139 (ref 137–147)

## 2017-12-16 LAB — CBC AND DIFFERENTIAL
HCT: 38 (ref 36–46)
Hemoglobin: 13 (ref 12.0–16.0)
Platelets: 336 (ref 150–399)
WBC: 7.5

## 2017-12-24 ENCOUNTER — Ambulatory Visit: Payer: BLUE CROSS/BLUE SHIELD | Admitting: Physician Assistant

## 2017-12-24 VITALS — BP 110/74 | HR 100 | Temp 98.2°F | Wt 204.4 lb

## 2017-12-24 DIAGNOSIS — H6122 Impacted cerumen, left ear: Secondary | ICD-10-CM

## 2017-12-24 DIAGNOSIS — G44209 Tension-type headache, unspecified, not intractable: Secondary | ICD-10-CM | POA: Diagnosis not present

## 2017-12-24 DIAGNOSIS — M2550 Pain in unspecified joint: Secondary | ICD-10-CM | POA: Diagnosis not present

## 2017-12-24 MED ORDER — TRAMADOL HCL 50 MG PO TABS: 50.0000 mg | ORAL_TABLET | Freq: Four times a day (QID) | ORAL | 0 refills | Status: DC | PRN

## 2017-12-24 MED ORDER — TRAMADOL HCL 50 MG PO TABS: 50.0000 mg | ORAL_TABLET | Freq: Four times a day (QID) | ORAL | 0 refills | Status: AC | PRN

## 2017-12-24 NOTE — Patient Instructions (Signed)
It was great to meet you!  Please go to the ER if you develop any suddenly worsening/severe headache, sudden vision changes, weakness/numbness on 1 side of the body, slurred speech, or confusion.  I recommend holding the Phentermine while having headache symptoms.  Please follow-up with Dr. Juleen China on Tuesday, sooner if needed.
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
## 2017-12-25 ENCOUNTER — Encounter: Payer: Self-pay | Admitting: Physician Assistant

## 2017-12-29 ENCOUNTER — Ambulatory Visit: Payer: BLUE CROSS/BLUE SHIELD | Admitting: Family Medicine

## 2017-12-29 IMAGING — US US TRANSVAGINAL NON-OB
1 series · 13 of 25 positions shown · non-contrast
Comparison: 06/12/2011.

CLINICAL DATA: Pelvic pain in a patient status post hysterectomy

EXAM:
TRANSABDOMINAL AND TRANSVAGINAL ULTRASOUND OF PELVIS
DOPPLER ULTRASOUND OF OVARIES
TECHNIQUE: Both transabdominal and transvaginal ultrasound examinations of the
pelvis were performed. Transabdominal technique was performed for
global imaging of the pelvis including uterus, ovaries, adnexal
regions, and pelvic cul-de-sac.
It was necessary to proceed with endovaginal exam following the
transabdominal exam to visualize the ovaries. Color and duplex
Doppler ultrasound was utilized to evaluate blood flow to the
ovaries.

[Series 1: us transvaginal non-ob · 0.22mm/px · 57 acquisitions, 13 frames shown]
[im 1/57]
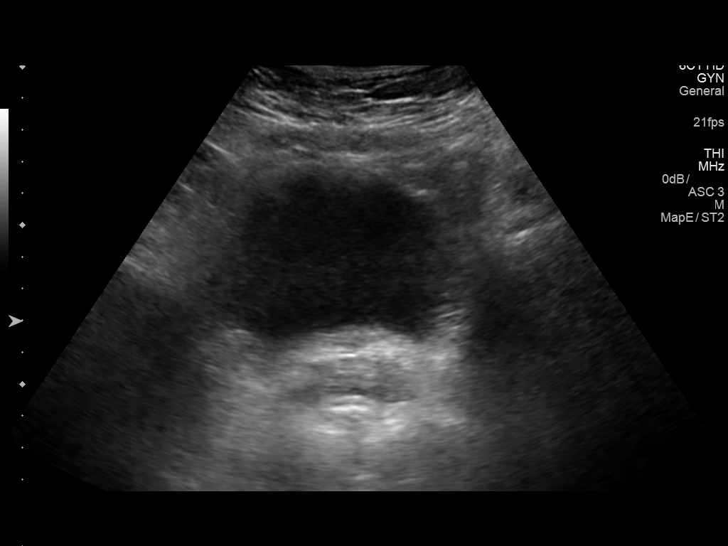
[im 5/57]
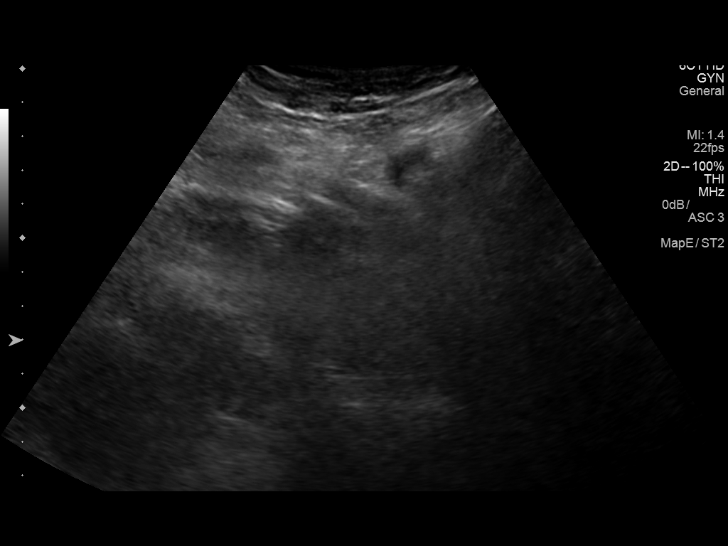
[im 10/57]
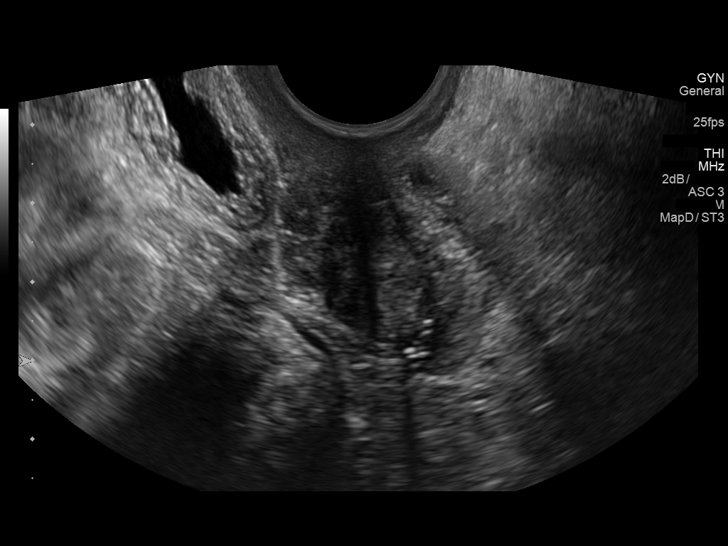
[im 15/57]
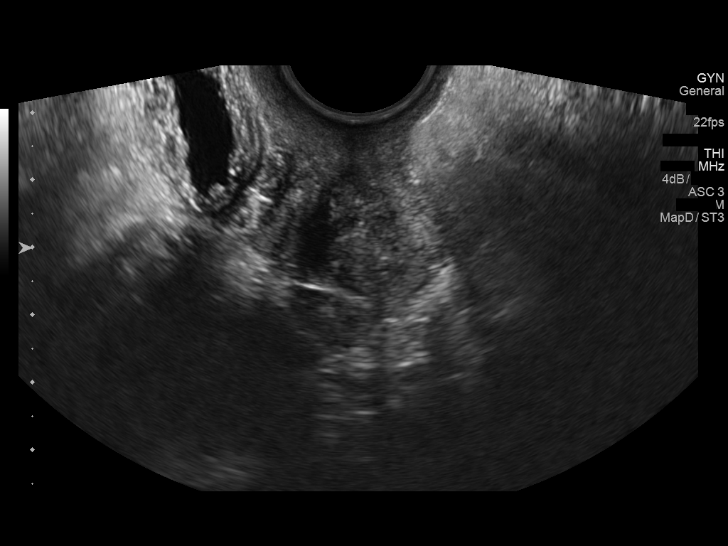
[im 19/57]
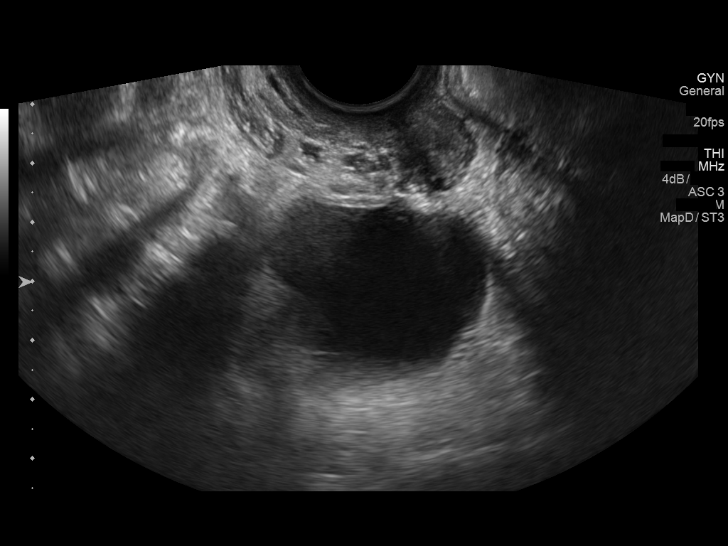
[im 24/57]
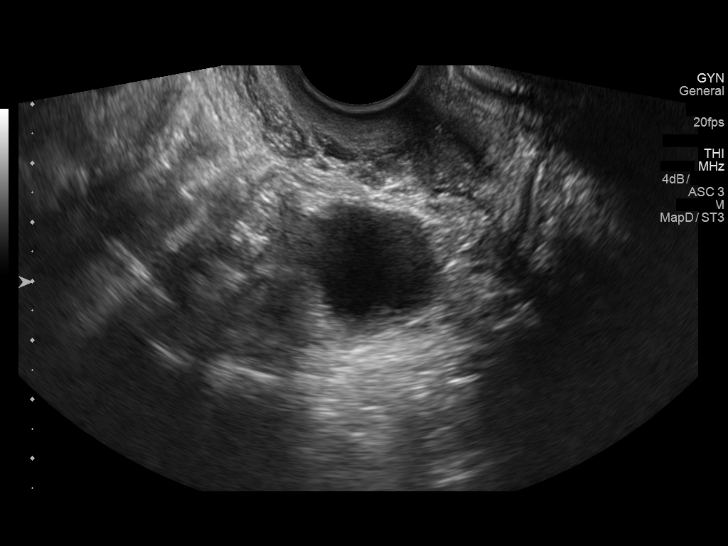
[im 29/57]
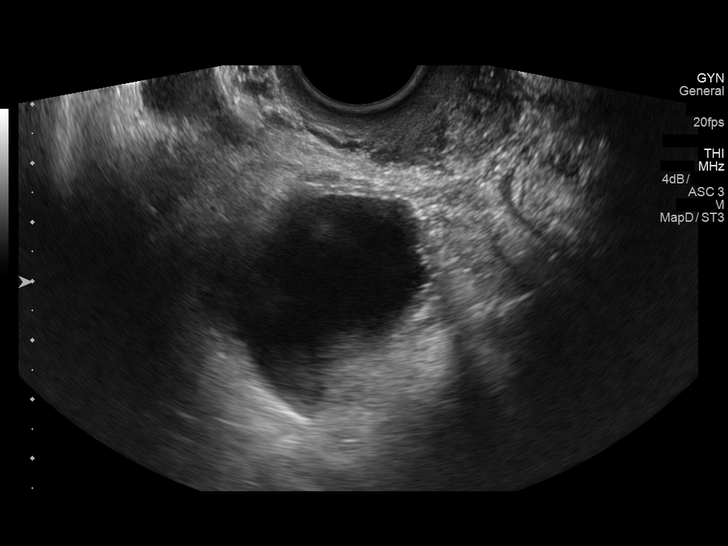
[im 33/57]
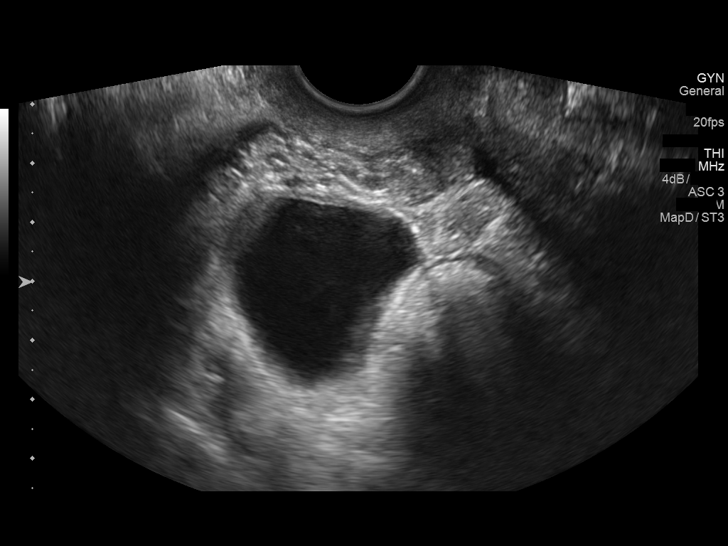
[im 38/57]
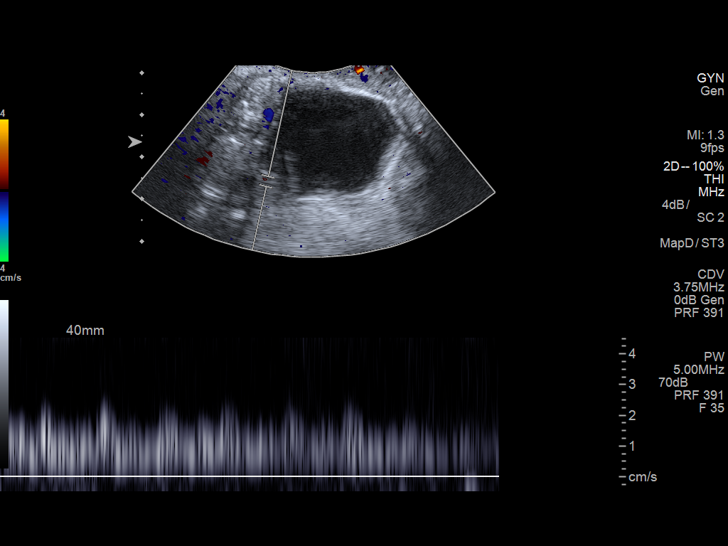
[im 43/57]
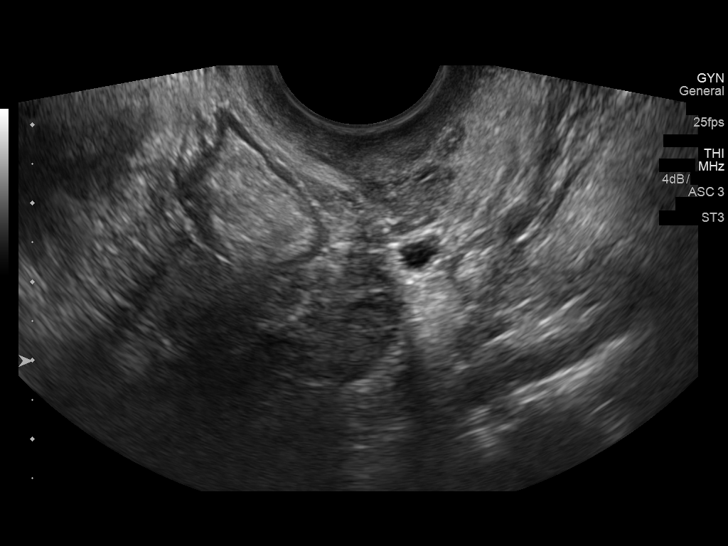
[im 47/57]
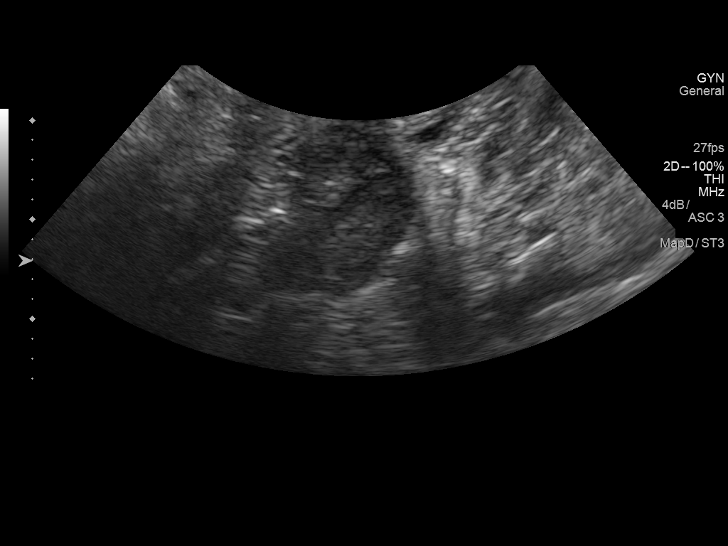
[im 52/57]
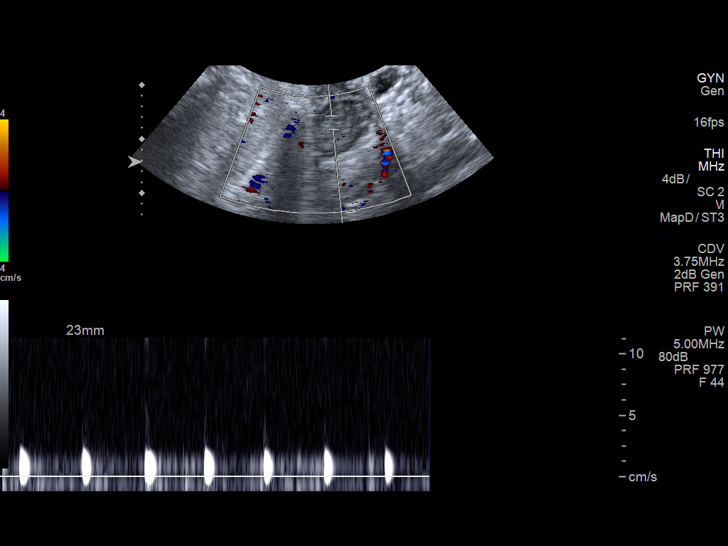
[im 57/57]
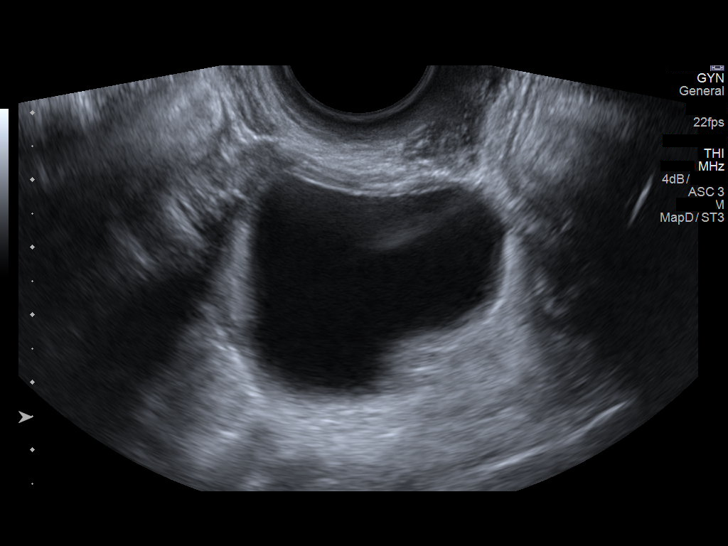

[13 of 25 positions shown; findings below may reference images not displayed]

FINDINGS: Uterus

Measurements: Surgically absent..

Endometrium

Thickness: N/A.

Right ovary

Measurements: 5.7 x 3.9 x 3.8 cm. 3.8 x 3.4 x 3.7 cm simple cyst
identified within the ovary.

Left ovary

Measurements: 1.8 x 1.0 x 1.0 cm. Normal appearance/no adnexal mass.

Pulsed Doppler evaluation of both ovaries demonstrates normal
low-resistance arterial and venous waveforms.

Other findings

No abnormal free fluid.
IMPRESSION: 1. 3.8 cm cyst with benign characteristics identified in the right
adnexal space. In a premenopausal female, consensus criteria
indicate no imaging follow-up necessary. This recommendation follows
the consensus statement: Management of Asymptomatic Ovarian and
Other Adnexal Cysts Imaged at US: Society of Radiologists in
Ultrasound Consensus Conference Statement. Radiology 9686;
2. Status post hysterectomy.

## 2017-12-29 NOTE — Progress Notes (Addendum)
[name] is a 42 y.o. female is here for follow up.  History of Present Illness:   [name], CMA acting as scribe for Dr. Briscoe [name].   HPI: Patient in for evaluation on depression. Feel like Zoloft is not helpful any more. She has been having increased stress at home. Having stress with husband. She is not currently seeing any one for therapy. Is looking for one in her area. She has been having increased fatigue and anxiety. She has thought of suicide but not in the last week. She does not feel like she would ever follow through for her child sake. She does feel like she has a good support system at this time.   Health Maintenance Due  Topic Date Due  . HIV Screening  11/18/1988  . TETANUS/TDAP  11/18/1992   Depression screen PHQ 2/9 12/30/2017  Decreased Interest 1  Down, Depressed, Hopeless 3  PHQ - 2 Score 4  Altered sleeping 1  Tired, decreased energy 3  Change in appetite 1  Feeling bad or failure about yourself  1  Trouble concentrating 1  Moving slowly or fidgety/restless 0  Suicidal thoughts 1  PHQ-9 Score 12  Difficult doing work/chores Very difficult  Some recent data might be hidden     PMHx, SurgHx, SocialHx, FamHx, Medications, and Allergies were reviewed in the Visit Navigator and updated as appropriate.   Patient Active Problem List   Diagnosis Date Noted  . OSA (obstructive sleep apnea) 08/15/2017  . Hip flexor tightness, left 08/04/2017  . Neck pain 08/04/2017  . Hypokalemia 07/29/2017  . Vitamin D deficiency 02/19/2017  . Fatigue 01/28/2017  . Polyarthralgia 01/28/2017  . Cognitive change 01/28/2017  . Depression 01/28/2017  . Obesity (BMI 30-39.9) 01/28/2017  . Primary insomnia 01/28/2017  . Gastroesophageal reflux disease without esophagitis 01/28/2017  . Malignant neoplasm of upper-outer quadrant of left breast in female, estrogen receptor positive ([place]) 07/29/2016  . Genetic testing 01/07/2016  . Breast cancer of upper-outer  quadrant of left female breast ([place]) 12/11/2015  . Endometriosis 05/12/2014  . History of hysterectomy for benign disease 05/12/2014   Social History   Tobacco Use  . Smoking status: Never Smoker  . Smokeless tobacco: Never Used  Substance Use Topics  . Alcohol use: Yes    Alcohol/week: 0.0 oz    Comment: socially   . Drug use: No   Current Medications and Allergies:   Current Outpatient Medications:  .  folic acid (FOLVITE) 1 MG tablet, Take 1 mg by mouth daily., Disp: , Rfl: 3 .  methotrexate 50 MG/2ML injection, INJECT 0.8ML WEEKLY INJECTION 30 DAYS, Disp: , Rfl: 2 .  omeprazole (PRILOSEC) 40 MG capsule, Take 1 tablet before breakfast and before dinner., Disp: 180 capsule, Rfl: 3 .  phentermine (ADIPEX-P) 37.5 MG tablet, Take 1 tablet (37.5 mg total) by mouth daily before breakfast., Disp: 30 tablet, Rfl: 2 .  predniSONE (DELTASONE) 10 MG tablet, TAKE 1 TABLET BY MOUTH DAILY FOR 8 WEEKS--- CURRENTLY PER PT IS TAKING HALF TABLET=5MG --- TAKES IN AM, Disp: , Rfl: 0 .  RESTASIS MULTIDOSE 0.05 % ophthalmic emulsion, Place 1 drop into both eyes 2 (two) times daily., Disp: , Rfl: 0 .  sertraline (ZOLOFT) 100 MG tablet, TAKE 1 TABLET BY MOUTH DAILY--- TAKES IN AM, Disp: 30 tablet, Rfl: 3 .  spironolactone (ALDACTONE) 25 MG tablet, TAKE 1 TABLET BY MOUTH EVERY DAY, Disp: 30 tablet, Rfl: 3 .  zolpidem (AMBIEN) 10 MG tablet, Take 1  tablet (10 mg total) by mouth at bedtime as needed. Take 1/2 to 1 tab po qhs prn, Disp: 30 tablet, Rfl: 1 .  DULoxetine (CYMBALTA) 20 MG capsule, Take 1 capsule (20 mg total) by mouth daily., Disp: 30 capsule, Rfl: 3   Allergies  Allergen Reactions  . Amoxicillin Shortness Of Breath and Itching    Has patient had a PCN reaction causing immediate rash, facial/tongue/throat swelling, SOB or lightheadedness with hypotension: Yes Has patient had a PCN reaction causing severe rash involving mucus membranes or skin necrosis: No Has patient had a PCN reaction that  required hospitalization Yes Has patient had a PCN reaction occurring within the last 10 years: No If all of the above answers are "NO", then may proceed with Cephalosporin use.   . Diflucan [Fluconazole] Nausea Only    heartburn   Review of Systems   Pertinent items are noted in the HPI. Otherwise, ROS is negative.  Vitals:   Vitals:   12/30/17 1302  BP: 110/74  Pulse: 100  Temp: 98.5 F (36.9 C)  TempSrc: Oral  SpO2: 97%  Weight: 206 lb 9.6 oz (93.7 kg)  Height: 5\' 4"  (1.626 m)     Body mass index is 35.46 kg/m.  Physical Exam:   Physical Exam  Constitutional: She appears well-nourished.  HENT:  Head: Normocephalic and atraumatic.  Eyes: Pupils are equal, round, and reactive to light. EOM are normal.  Neck: Normal range of motion. Neck supple.  Cardiovascular: Normal rate, regular rhythm, normal heart sounds and intact distal pulses.  Pulmonary/Chest: Effort normal.  Abdominal: Soft.  Skin: Skin is warm.  Psychiatric: She has a normal mood and affect. Her behavior is normal.  Nursing note and vitals reviewed.   Assessment and Plan:   [name] was seen today for follow-up and depression.  Diagnoses and all orders for this visit:  Reactive depression -     DULoxetine (CYMBALTA) 20 MG capsule; Take 1 capsule (20 mg total) by mouth daily.  Acute non intractable tension-type headache  Polyarthralgia  Paperwork completed for [employer] program for breast cancer survivors - exercising and group work. She will also start seeing [name] on days that she comes to see me.   . Reviewed expectations re: course of current medical issues. . Discussed self-management of symptoms. . Outlined signs and symptoms indicating need for more acute intervention. . Patient verbalized understanding and all questions were answered. Marland Kitchen Health Maintenance issues including appropriate healthy diet, exercise, and smoking avoidance were discussed with patient. . See orders for this visit as  documented in the electronic medical record. . Patient received an After Visit Summary.  Jordan Deutscher, DO [employer] [place] 01/03/2018  Future Appointments  Date Time Provider [place]  01/12/2018  2:50 PM Jordan Partridge, DO [facility] None  03/01/2018  1:40 PM Jordan Deutscher, DO [facility]  03/03/2018  1:15 PM [name], MD [place] None  06/08/2018  9:00 AM [facility] None  06/08/2018  9:30 AM Magrinat, Virgie Dad, MD [facility] None   CMA served as scribe during this visit. History, Physical, and Plan performed by medical provider. The above documentation has been reviewed and is accurate and complete. [name], D.O.

## 2017-12-30 ENCOUNTER — Encounter: Payer: Self-pay | Admitting: Family Medicine

## 2017-12-30 ENCOUNTER — Ambulatory Visit (INDEPENDENT_AMBULATORY_CARE_PROVIDER_SITE_OTHER): Payer: BLUE CROSS/BLUE SHIELD | Admitting: Family Medicine

## 2017-12-30 VITALS — BP 110/74 | HR 100 | Temp 98.5°F | Ht 64.0 in | Wt 206.6 lb

## 2017-12-30 DIAGNOSIS — M2550 Pain in unspecified joint: Secondary | ICD-10-CM | POA: Diagnosis not present

## 2017-12-30 DIAGNOSIS — F329 Major depressive disorder, single episode, unspecified: Secondary | ICD-10-CM | POA: Diagnosis not present

## 2017-12-30 DIAGNOSIS — G44209 Tension-type headache, unspecified, not intractable: Secondary | ICD-10-CM

## 2017-12-30 MED ORDER — DULOXETINE HCL 20 MG PO CPEP: 20.0000 mg | ORAL_CAPSULE | Freq: Every day | ORAL | 3 refills | Status: DC

## 2017-12-30 NOTE — Patient Instructions (Signed)
You will decrease Zoloft to 50mg  daily for one week then change to the Cymbalta daily.

## 2018-01-03 ENCOUNTER — Encounter: Payer: Self-pay | Admitting: Family Medicine

## 2018-01-06 ENCOUNTER — Encounter: Payer: Self-pay | Admitting: Physical Therapy

## 2018-01-08 ENCOUNTER — Telehealth: Payer: Self-pay | Admitting: Family Medicine

## 2018-01-08 MED ORDER — TRAMADOL HCL 50 MG PO TABS: 50.0000 mg | ORAL_TABLET | Freq: Three times a day (TID) | ORAL | 0 refills | Status: DC | PRN

## 2018-01-08 NOTE — Telephone Encounter (Signed)
Copied from [place] [id number]. Topic: Inquiry >> Jan 08, 2018 11:55 AM Pricilla Handler wrote: Reason for CRM: Patient called requesting a refill of TraMADol (ULTRAM) 50 MG tablet. Patient is completely out of the medication. Patient's preferred pharmacy is [id number] - [place], [address] [phone] (Phone)  [phone] (Fax).      Thank You!!!

## 2018-01-08 NOTE — Telephone Encounter (Signed)
Completed. Remind her to use sparingly. Increases level of depression medication.

## 2018-01-08 NOTE — Telephone Encounter (Signed)
See note

## 2018-01-08 NOTE — Telephone Encounter (Signed)
LOV  12/30/17 Dr. Karlene Lineman is not on medication list and don't see it mentioned in recent notes.

## 2018-01-08 NOTE — Telephone Encounter (Signed)
Please advise 

## 2018-01-11 ENCOUNTER — Other Ambulatory Visit: Payer: Self-pay

## 2018-01-11 MED ORDER — TRAMADOL HCL 50 MG PO TABS: 50.0000 mg | ORAL_TABLET | Freq: Three times a day (TID) | ORAL | 0 refills | Status: DC | PRN

## 2018-01-11 NOTE — Telephone Encounter (Signed)
Message from [place] from another patient .   Left a message with receptionist for [name]. She has an urgent need for refill on her Tramadol. She is having memory issues so she forgot to get the refill in time and is currently in a lot of pain. [facility] [address] in [place].

## 2018-01-11 NOTE — Telephone Encounter (Signed)
Called patient l/m with all information and to call if any questions.

## 2018-01-12 ENCOUNTER — Encounter

## 2018-01-12 ENCOUNTER — Ambulatory Visit (INDEPENDENT_AMBULATORY_CARE_PROVIDER_SITE_OTHER): Payer: BLUE CROSS/BLUE SHIELD | Admitting: Neurology

## 2018-01-12 ENCOUNTER — Encounter: Payer: Self-pay | Admitting: Neurology

## 2018-01-12 VITALS — BP 114/80 | HR 92 | Ht 64.0 in | Wt 206.0 lb

## 2018-01-12 DIAGNOSIS — G3184 Mild cognitive impairment, so stated: Secondary | ICD-10-CM

## 2018-01-12 DIAGNOSIS — F4321 Adjustment disorder with depressed mood: Secondary | ICD-10-CM

## 2018-01-12 DIAGNOSIS — G43709 Chronic migraine without aura, not intractable, without status migrainosus: Secondary | ICD-10-CM | POA: Diagnosis not present

## 2018-01-12 DIAGNOSIS — Z853 Personal history of malignant neoplasm of breast: Secondary | ICD-10-CM | POA: Diagnosis not present

## 2018-01-12 DIAGNOSIS — H4710 Unspecified papilledema: Secondary | ICD-10-CM

## 2018-01-12 MED ORDER — TOPIRAMATE 50 MG PO TABS: 50.0000 mg | ORAL_TABLET | Freq: Every day | ORAL | 2 refills | Status: DC

## 2018-01-12 MED ORDER — SUMATRIPTAN SUCCINATE 100 MG PO TABS: ORAL_TABLET | ORAL | 2 refills | Status: DC

## 2018-01-12 MED ORDER — PROPRANOLOL HCL ER 60 MG PO CP24: 60.0000 mg | ORAL_CAPSULE | Freq: Every day | ORAL | 2 refills | Status: DC

## 2018-01-12 NOTE — Patient Instructions (Addendum)
Migraine Recommendations: 1.  Start propranolol ER 60mg  daily.  Call in 4 weeks with update and we can adjust dose if needed. 2.  Take sumatriptan 100mg  at earliest onset of headache.  May repeat dose once in 2 hours if needed.  Do not exceed two tablets in 24 hours. 3.  Limit use of pain relievers to no more than 2 days out of the week.  These medications include acetaminophen, ibuprofen, triptans and narcotics.  This will help reduce risk of rebound headaches. 4.  Be aware of common food triggers such as processed sweets, processed foods with nitrites (such as deli meat, hot dogs, sausages), foods with MSG, alcohol (such as wine), chocolate, certain cheeses, certain fruits (dried fruits, bananas, some citrus fruit), vinegar, diet soda. 4.  Avoid caffeine 5.  Routine exercise 6.  Proper sleep hygiene 7.  Stay adequately hydrated with water 8.  Keep a headache diary. 9.  Maintain proper stress management. 10.  Do not skip meals. 11.  Consider supplements:  Magnesium citrate 400mg  to 600mg  daily, riboflavin 400mg , Coenzyme Q 10 100mg  three times daily 12.  Discuss with Dr. Juleen China about referral to cognitive rehabilitation at [facility] as recommended by Dr. Si Raider 13.  Follow up in 3 months

## 2018-01-12 NOTE — Addendum Note (Signed)
Addended by: Clois Comber on: 01/12/2018 06:16 PM   Modules accepted: Orders
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
## 2018-01-14 NOTE — Telephone Encounter (Signed)
Patient returning a call to Joellen. Please advise.  

## 2018-01-14 NOTE — Telephone Encounter (Signed)
Called patient she is ok with moving date/time of app.

## 2018-01-17 IMAGING — CR DG CHEST 2V
2 series · 2 of 2 positions shown · non-contrast
Comparison: 08/22/2016

CLINICAL DATA: Left breast carcinoma with shortness of breath and
cough

EXAM:
CHEST  2 VIEW

[w chest pa]
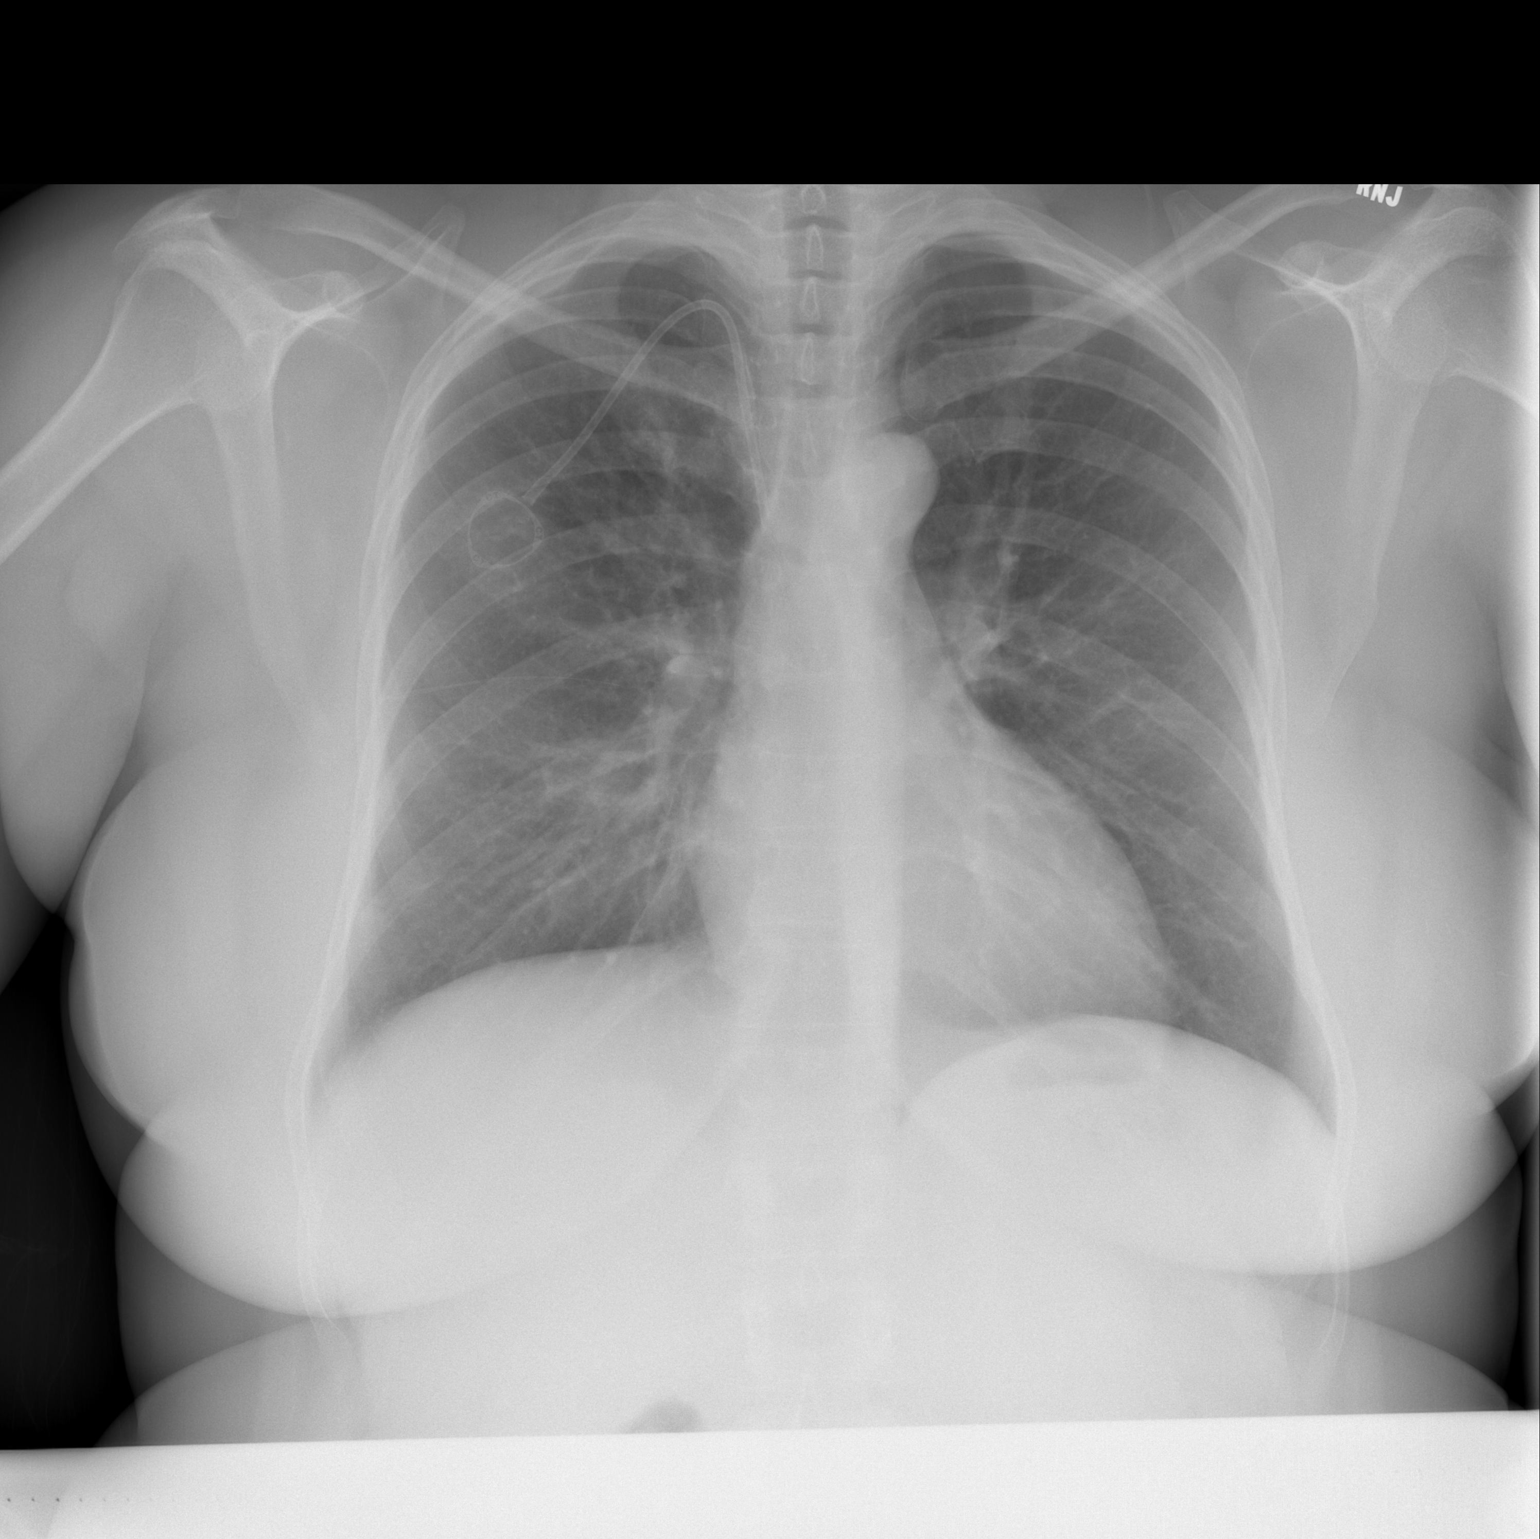

[w chest lat]
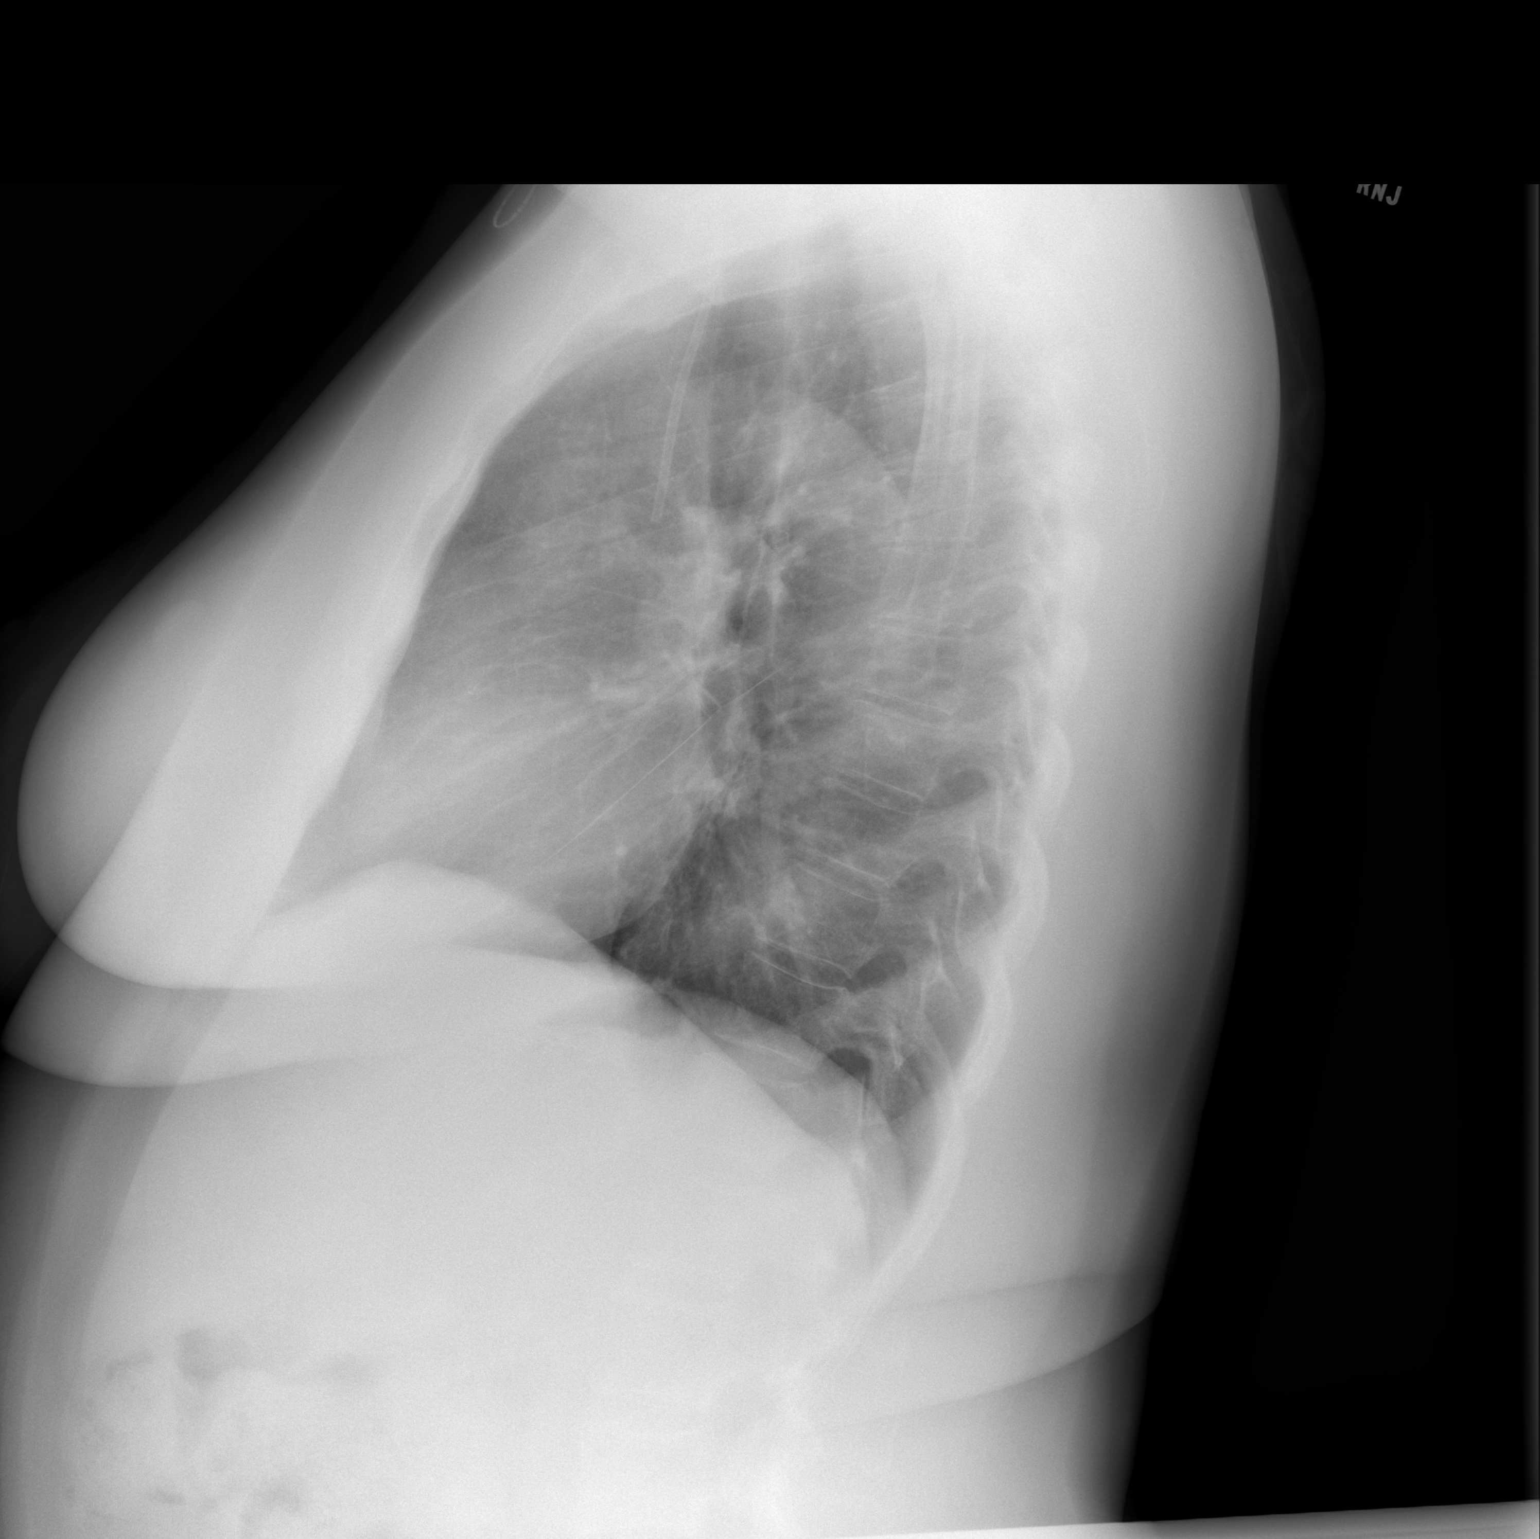

[2 of 2 positions shown; findings below may reference images not displayed]

FINDINGS: Right-sided chest wall port is again seen and stable with the
catheter tip in the proximal superior vena cava. The cardiac shadow
is within normal limits. The lungs are well aerated bilaterally. No
focal infiltrate or effusion is seen. No bony abnormality is noted.
IMPRESSION: No active cardiopulmonary disease.

## 2018-01-21 ENCOUNTER — Other Ambulatory Visit: Payer: Self-pay | Admitting: Family Medicine

## 2018-01-21 DIAGNOSIS — G4700 Insomnia, unspecified: Secondary | ICD-10-CM

## 2018-01-21 NOTE — Telephone Encounter (Signed)
Zolpidem refill. Pt states she has two pills left Last Refill:10/27/17 #30 with 1 refill Last OV: 12/30/17 PCP: Dr. Juleen China [facility] [place]  [place]

## 2018-01-21 NOTE — Telephone Encounter (Signed)
Copied from [place] [id number]. Topic: Quick Communication - Rx Refill/Question >> Jan 21, 2018 12:15 PM Burchel, Abbi R wrote: Medication: zolpidem (AMBIEN) 10 MG tablet    Preferred [facility]acy [id number] [place], [place] [facility] [address]  [phone] (Phone) [phone] (Fax)  Pt: [phone]    Pt has 2 pills left.  Pt advised that RX refills may take up to 3 business days.

## 2018-01-22 ENCOUNTER — Other Ambulatory Visit: Payer: Self-pay | Admitting: Surgical

## 2018-01-22 DIAGNOSIS — G4700 Insomnia, unspecified: Secondary | ICD-10-CM

## 2018-01-22 MED ORDER — ZOLPIDEM TARTRATE 10 MG PO TABS: 10.0000 mg | ORAL_TABLET | Freq: Every evening | ORAL | 1 refills | Status: DC | PRN

## 2018-01-22 NOTE — Telephone Encounter (Signed)
See note

## 2018-01-22 NOTE — Telephone Encounter (Signed)
OK to refill

## 2018-01-27 ENCOUNTER — Ambulatory Visit
Admission: RE | Admit: 2018-01-27 | Discharge: 2018-01-27 | Disposition: A | Discharge status: Home / Self Care | Payer: BLUE CROSS/BLUE SHIELD | Source: Ambulatory Visit | Attending: Neurology | Admitting: Neurology

## 2018-01-27 ENCOUNTER — Encounter: Payer: Self-pay | Admitting: Surgical

## 2018-01-27 DIAGNOSIS — H4710 Unspecified papilledema: Secondary | ICD-10-CM

## 2018-01-27 DIAGNOSIS — Z853 Personal history of malignant neoplasm of breast: Secondary | ICD-10-CM

## 2018-01-27 DIAGNOSIS — M1990 Unspecified osteoarthritis, unspecified site: Secondary | ICD-10-CM

## 2018-01-27 DIAGNOSIS — G43709 Chronic migraine without aura, not intractable, without status migrainosus: Secondary | ICD-10-CM

## 2018-01-27 DIAGNOSIS — M069 Rheumatoid arthritis, unspecified: Secondary | ICD-10-CM | POA: Insufficient documentation

## 2018-01-27 MED ORDER — GADOBENATE DIMEGLUMINE 529 MG/ML IV SOLN
19.0000 mL | Freq: Once | INTRAVENOUS | Status: AC | PRN
  Administered 2018-01-27: 19 mL via INTRAVENOUS

## 2018-01-28 ENCOUNTER — Telehealth: Payer: Self-pay | Admitting: *Deleted

## 2018-01-28 ENCOUNTER — Telehealth: Payer: Self-pay

## 2018-01-28 DIAGNOSIS — H4710 Unspecified papilledema: Secondary | ICD-10-CM

## 2018-01-28 NOTE — Telephone Encounter (Signed)
-----   Message from Pieter Partridge, DO sent at 01/28/2018  7:25 AM EDT ----- MRI of brain is normal and shows no findings to explain papilledema.  If agreeable, we can refer her for evaluation by another ophthalmologist.

## 2018-01-28 NOTE — Telephone Encounter (Signed)
Called pharmacy gave verbal to Barnett Applebaum states never received when we faxed but conformation is attracted. Called patient and let her know will be ready today l

## 2018-01-28 NOTE — Telephone Encounter (Signed)
Called and spoke with Pt, she is agreeable to see another opthalmologist. Sending referral to Dr Quentin Ore.

## 2018-01-28 NOTE — Telephone Encounter (Signed)
Copied from [place] [id number]. Topic: Quick Communication - Rx Refill/Question >> Jan 21, 2018 12:15 PM Burchel, Abbi R wrote: Medication: zolpidem (AMBIEN) 10 MG tablet    Preferred [id number] [place], [place] AT [address]  [phone] (Phone) [phone] (Fax)  Pt: [phone]    Pt has 2 pills left.  Pt advised that RX refills may take up to 3 business days.  >> Jan 28, 2018  9:24 AM Percell Belt A wrote: Pt called in and stated [facility] and she would like to know if this can be resent.  [facility]   Best number for pt  [phone]

## 2018-02-16 ENCOUNTER — Other Ambulatory Visit: Payer: Self-pay | Admitting: Oncology

## 2018-02-16 ENCOUNTER — Telehealth: Payer: Self-pay | Admitting: Neurology

## 2018-02-16 NOTE — Telephone Encounter (Signed)
[facility] care called regarding this patient and they wanted Dr. Tomi Likens to be aware that she has cancelled her last to scheduled appointments and has yet to be seen. Thanks

## 2018-02-24 ENCOUNTER — Encounter: Payer: Self-pay | Admitting: Family Medicine

## 2018-02-24 ENCOUNTER — Ambulatory Visit (INDEPENDENT_AMBULATORY_CARE_PROVIDER_SITE_OTHER): Payer: BLUE CROSS/BLUE SHIELD | Admitting: Family Medicine

## 2018-02-24 ENCOUNTER — Ambulatory Visit: Payer: BLUE CROSS/BLUE SHIELD | Admitting: Psychology

## 2018-02-24 VITALS — BP 114/72 | HR 96 | Temp 98.6°F | Ht 64.0 in | Wt 202.4 lb

## 2018-02-24 DIAGNOSIS — G44209 Tension-type headache, unspecified, not intractable: Secondary | ICD-10-CM

## 2018-02-24 DIAGNOSIS — F321 Major depressive disorder, single episode, moderate: Secondary | ICD-10-CM

## 2018-02-24 DIAGNOSIS — M069 Rheumatoid arthritis, unspecified: Secondary | ICD-10-CM

## 2018-02-24 DIAGNOSIS — G4700 Insomnia, unspecified: Secondary | ICD-10-CM | POA: Diagnosis not present

## 2018-02-24 MED ORDER — BACLOFEN 10 MG PO TABS: 10.0000 mg | ORAL_TABLET | Freq: Three times a day (TID) | ORAL | 0 refills | Status: DC

## 2018-02-24 MED ORDER — TRAMADOL HCL 50 MG PO TABS: 50.0000 mg | ORAL_TABLET | Freq: Three times a day (TID) | ORAL | 0 refills | Status: DC | PRN

## 2018-02-24 MED ORDER — VENLAFAXINE HCL ER 37.5 MG PO CP24: 37.5000 mg | ORAL_CAPSULE | Freq: Every day | ORAL | 0 refills | Status: DC

## 2018-02-24 NOTE — Progress Notes (Signed)
[name] is a 42 y.o. female is here for follow up.  History of Present Illness:   [name], CMA acting as scribe for Dr. Briscoe Deutscher.   HPI: Patient in office for follow up. On medications.   Phentermine: Patient is taking daily. She denies any side effects. She is down 4 pounds from last office visit. She is working on decreasing her portions and calories as well as increasing water intake. She does not need refill on this at today's office visit. She can not tell an improvement on appetite suppression while on phentermine but has noticed an increase in energy that helps with overall mood and wt loss.  She did have an appointment scheduled today with [name] but due to confusion about time did not make that appointment. It has been rescheduled for 9/4. We did obtain an updated PHQ-9 in office today.   Health Maintenance Due  Topic Date Due  . HIV Screening  11/18/1988  . TETANUS/TDAP  11/18/1992  . INFLUENZA VACCINE  02/04/2018   Depression screen [facility] 2/9 02/24/2018 12/30/2017  Decreased Interest 3 1  Down, Depressed, Hopeless 3 3  PHQ - 2 Score 6 4  Altered sleeping 3 1  Tired, decreased energy 2 3  Change in appetite 0 1  Feeling bad or failure about yourself  3 1  Trouble concentrating 3 1  Moving slowly or fidgety/restless 0 0  Suicidal thoughts 0 1  PHQ-9 Score 17 12  Difficult doing work/chores Very difficult Very difficult  Some recent data might be hidden   PMHx, SurgHx, SocialHx, FamHx, Medications, and Allergies were reviewed in the Visit Navigator and updated as appropriate.   Patient Active Problem List   Diagnosis Date Noted  . Inflammatory arthritis 01/27/2018  . Rheumatoid arthritis ([place]) 01/27/2018  . OSA (obstructive sleep apnea) 08/15/2017  . Hip flexor tightness, left 08/04/2017  . Neck pain 08/04/2017  . Hypokalemia 07/29/2017  . Vitamin D deficiency 02/19/2017  . Fatigue 01/28/2017  . Polyarthralgia 01/28/2017  . Cognitive change  01/28/2017  . Depression 01/28/2017  . Obesity (BMI 30-39.9) 01/28/2017  . Primary insomnia 01/28/2017  . Gastroesophageal reflux disease without esophagitis 01/28/2017  . Malignant neoplasm of upper-outer quadrant of left breast in female, estrogen receptor positive ([place]) 07/29/2016  . Genetic testing 01/07/2016  . Breast cancer of upper-outer quadrant of left female breast ([place]) 12/11/2015  . Endometriosis 05/12/2014  . History of hysterectomy for benign disease 05/12/2014   Social History   Tobacco Use  . Smoking status: Never Smoker  . Smokeless tobacco: Never Used  Substance Use Topics  . Alcohol use: Yes    Alcohol/week: 0.0 standard drinks    Comment: socially   . Drug use: No   Current Medications and Allergies:   Current Outpatient Medications:  .  DULoxetine (CYMBALTA) 20 MG capsule, Take 1 capsule (20 mg total) by mouth daily., Disp: 30 capsule, Rfl: 3 .  folic acid (FOLVITE) 1 MG tablet, Take 1 mg by mouth daily., Disp: , Rfl: 3 .  methotrexate 50 MG/2ML injection, INJECT 0.8ML WEEKLY INJECTION 30 DAYS, Disp: , Rfl: 2 .  omeprazole (PRILOSEC) 40 MG capsule, Take 1 tablet before breakfast and before dinner., Disp: 180 capsule, Rfl: 3 .  phentermine (ADIPEX-P) 37.5 MG tablet, Take 1 tablet (37.5 mg total) by mouth daily before breakfast., Disp: 30 tablet, Rfl: 2 .  predniSONE (DELTASONE) 10 MG tablet, TAKE 1 TABLET BY MOUTH DAILY FOR 8 WEEKS--- CURRENTLY PER PT IS  TAKING HALF TABLET=5MG --- TAKES IN AM, Disp: , Rfl: 0 .  propranolol ER (INDERAL LA) 60 MG 24 hr capsule, Take 1 capsule (60 mg total) by mouth daily., Disp: 30 capsule, Rfl: 2 .  RESTASIS MULTIDOSE 0.05 % ophthalmic emulsion, Place 1 drop into both eyes 2 (two) times daily., Disp: , Rfl: 0 .  sertraline (ZOLOFT) 100 MG tablet, TAKE 1 TABLET BY MOUTH DAILY--- TAKES IN AM, Disp: 30 tablet, Rfl: 3 .  spironolactone (ALDACTONE) 25 MG tablet, TAKE 1 TABLET BY MOUTH EVERY DAY, Disp: 30 tablet, Rfl: 3 .  SUMAtriptan  (IMITREX) 100 MG tablet, Take 1 tablet earliest onset of headache.  May repeat x1 in 2 hours if headache persists or recurs.  Do not exceed 2 tablets in 24 hours., Disp: 10 tablet, Rfl: 2 .  traMADol (ULTRAM) 50 MG tablet, Take 1 tablet (50 mg total) by mouth every 8 (eight) hours as needed., Disp: 30 tablet, Rfl: 0 .  zolpidem (AMBIEN) 10 MG tablet, Take 1 tablet (10 mg total) by mouth at bedtime as needed. Take 1/2 to 1 tab po qhs prn, Disp: 30 tablet, Rfl: 1   Allergies  Allergen Reactions  . Amoxicillin Shortness Of Breath and Itching    Has patient had a PCN reaction causing immediate rash, facial/tongue/throat swelling, SOB or lightheadedness with hypotension: Yes Has patient had a PCN reaction causing severe rash involving mucus membranes or skin necrosis: No Has patient had a PCN reaction that required hospitalization Yes Has patient had a PCN reaction occurring within the last 10 years: No If all of the above answers are "NO", then may proceed with Cephalosporin use.   . Diflucan [Fluconazole] Nausea Only    heartburn   Review of Systems   Pertinent items are noted in the HPI. Otherwise, ROS is negative.  Vitals:   Vitals:   02/24/18 1520  BP: 114/72  Pulse: 96  Temp: 98.6 F (37 C)  TempSrc: Oral  SpO2: 96%  Weight: 202 lb 6.4 oz (91.8 kg)  Height: 5\' 4"  (1.626 m)     Body mass index is 34.74 kg/m.  Physical Exam:   Physical Exam  Constitutional: She appears well-nourished.  HENT:  Head: Normocephalic and atraumatic.  Eyes: Pupils are equal, round, and reactive to light. EOM are normal.  Neck: Normal range of motion. Neck supple.  Cardiovascular: Normal rate, regular rhythm, normal heart sounds and intact distal pulses.  Pulmonary/Chest: Effort normal.  Abdominal: Soft.  Skin: Skin is warm.  Psychiatric: She has a normal mood and affect. Her behavior is normal.  Nursing note and vitals reviewed.   Results for orders placed or performed in visit on  01/06/18  CBC and differential  Result Value Ref Range   Hemoglobin 13.0 12.0 - 16.0   HCT 38 36 - 46   Platelets 336 150 - 399   WBC 7.5   Basic metabolic panel  Result Value Ref Range   Glucose 95    BUN 12 4 - 21   Creatinine 0.9 0.5 - 1.1   Potassium 4.5 3.4 - 5.3   Sodium 139 137 - 147  Hepatic function panel  Result Value Ref Range   Alkaline Phosphatase 88 25 - 125   ALT 22 7 - 35   AST 14 13 - 35   Bilirubin, Total 0.2     Assessment and Plan:   Idelle was seen today for follow-up.  Diagnoses and all orders for this visit:  Current moderate episode  of major depressive disorder without prior episode (HCC) -     venlafaxine XR (EFFEXOR XR) 37.5 MG 24 hr capsule; Take 1 capsule (37.5 mg total) by mouth daily with breakfast.  Insomnia, unspecified type  Rheumatoid arthritis, involving unspecified site, unspecified rheumatoid factor presence (HCC)  Acute non intractable tension-type headache -     baclofen (LIORESAL) 10 MG tablet; Take 1 tablet (10 mg total) by mouth 3 (three) times daily.  Other orders -     traMADol (ULTRAM) 50 MG tablet; Take 1 tablet (50 mg total) by mouth every 8 (eight) hours as needed.    . Reviewed expectations re: course of current medical issues. . Discussed self-management of symptoms. . Outlined signs and symptoms indicating need for more acute intervention. . Patient verbalized understanding and all questions were answered. Marland Kitchen Health Maintenance issues including appropriate healthy diet, exercise, and smoking avoidance were discussed with patient. . See orders for this visit as documented in the electronic medical record. . Patient received an After Visit Summary.  CMA served as Education administrator during this visit. History, Physical, and Plan performed by medical provider. The above documentation has been reviewed and is accurate and complete. Briscoe Deutscher, D.O.  Briscoe Deutscher, DO [place], [place] [facility] 03/01/2018

## 2018-03-01 ENCOUNTER — Ambulatory Visit: Payer: BLUE CROSS/BLUE SHIELD | Admitting: Family Medicine

## 2018-03-01 ENCOUNTER — Encounter: Payer: Self-pay | Admitting: Family Medicine
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
## 2018-03-03 ENCOUNTER — Ambulatory Visit: Payer: BLUE CROSS/BLUE SHIELD | Admitting: Obstetrics and Gynecology

## 2018-03-10 ENCOUNTER — Ambulatory Visit: Payer: BLUE CROSS/BLUE SHIELD | Admitting: Psychology

## 2018-03-13 ENCOUNTER — Other Ambulatory Visit: Payer: Self-pay | Admitting: Family Medicine

## 2018-03-13 DIAGNOSIS — E876 Hypokalemia: Secondary | ICD-10-CM

## 2018-03-13 DIAGNOSIS — I10 Essential (primary) hypertension: Secondary | ICD-10-CM

## 2018-03-17 ENCOUNTER — Telehealth: Payer: Self-pay | Admitting: Neurology

## 2018-03-17 NOTE — Telephone Encounter (Signed)
Zach From [facility] called and wanted to let Dr. Tomi Likens know that this patient has rescheduled her appt with them 3 times. He said she has one on 03/30/2018 at 2:30 pm but if she does not show she will need to be referred to another office. Thanks

## 2018-03-18 NOTE — Telephone Encounter (Signed)
Can we contact her.  She has to understand that it is very important to have the eye exam and that she should not cancel or miss this scheduled appointment.

## 2018-03-18 NOTE — Telephone Encounter (Signed)
Called Pt, LMOVM advising it is very important to keep the appt with [facility] care, and to please call me to let me know she rcvd my call

## 2018-03-24 ENCOUNTER — Ambulatory Visit (INDEPENDENT_AMBULATORY_CARE_PROVIDER_SITE_OTHER): Payer: BLUE CROSS/BLUE SHIELD | Admitting: Psychology

## 2018-03-24 DIAGNOSIS — F321 Major depressive disorder, single episode, moderate: Secondary | ICD-10-CM | POA: Diagnosis not present

## 2018-03-25 ENCOUNTER — Ambulatory Visit: Payer: BLUE CROSS/BLUE SHIELD | Admitting: Obstetrics and Gynecology

## 2018-03-25 ENCOUNTER — Encounter: Payer: Self-pay | Admitting: Obstetrics and Gynecology

## 2018-03-25 ENCOUNTER — Other Ambulatory Visit: Payer: Self-pay

## 2018-03-25 VITALS — BP 110/78 | HR 96 | Resp 14 | Ht 63.0 in | Wt 202.4 lb

## 2018-03-25 DIAGNOSIS — Z01419 Encounter for gynecological examination (general) (routine) without abnormal findings: Secondary | ICD-10-CM

## 2018-03-25 DIAGNOSIS — Z8742 Personal history of other diseases of the female genital tract: Secondary | ICD-10-CM

## 2018-03-25 DIAGNOSIS — M6289 Other specified disorders of muscle: Secondary | ICD-10-CM

## 2018-03-25 DIAGNOSIS — N941 Unspecified dyspareunia: Secondary | ICD-10-CM

## 2018-03-25 DIAGNOSIS — Z853 Personal history of malignant neoplasm of breast: Secondary | ICD-10-CM

## 2018-03-25 NOTE — Patient Instructions (Signed)
EXERCISE AND DIET:  We recommended that you start or continue a regular exercise program for good health. Regular exercise means any activity that makes your heart beat faster and makes you sweat.  We recommend exercising at least 30 minutes per day at least 3 days a week, preferably 4 or 5.  We also recommend a diet low in fat and sugar.  Inactivity, poor dietary choices and obesity can cause diabetes, heart attack, stroke, and kidney damage, among others.    ALCOHOL AND SMOKING:  Women should limit their alcohol intake to no more than 7 drinks/beers/glasses of wine (combined, not each!) per week. Moderation of alcohol intake to this level decreases your risk of breast cancer and liver damage. And of course, no recreational drugs are part of a healthy lifestyle.  And absolutely no smoking or even second hand smoke. Most people know smoking can cause heart and lung diseases, but did you know it also contributes to weakening of your bones? Aging of your skin?  Yellowing of your teeth and nails?  CALCIUM AND VITAMIN D:  Adequate intake of calcium and Vitamin D are recommended.  The recommendations for exact amounts of these supplements seem to change often, but generally speaking 600 mg of calcium (either carbonate or citrate) and 800 units of Vitamin D per day seems prudent. Certain women may benefit from higher intake of Vitamin D.  If you are among these women, your doctor will have told you during your visit.    PAP SMEARS:  Pap smears, to check for cervical cancer or precancers,  have traditionally been done yearly, although recent scientific advances have shown that most women can have pap smears less often.  However, every woman still should have a physical exam from her gynecologist every year. It will include a breast check, inspection of the vulva and vagina to check for abnormal growths or skin changes, a visual exam of the cervix, and then an exam to evaluate the size and shape of the uterus and  ovaries.  And after 44 years of age, a rectal exam is indicated to check for rectal cancers. We will also provide age appropriate advice regarding health maintenance, like when you should have certain vaccines, screening for sexually transmitted diseases, bone density testing, colonoscopy, mammograms, etc.   MAMMOGRAMS:  All women over 40 years old should have a yearly mammogram. Many facilities now offer a "3D" mammogram, which may cost around $50 extra out of pocket. If possible,  we recommend you accept the option to have the 3D mammogram performed.  It both reduces the number of women who will be called back for extra views which then turn out to be normal, and it is better than the routine mammogram at detecting truly abnormal areas.    COLONOSCOPY:  Colonoscopy to screen for colon cancer is recommended for all women at age 50.  We know, you hate the idea of the prep.  We agree, BUT, having colon cancer and not knowing it is worse!!  Colon cancer so often starts as a polyp that can be seen and removed at colonscopy, which can quite literally save your life!  And if your first colonoscopy is normal and you have no family history of colon cancer, most women don't have to have it again for 10 years.  Once every ten years, you can do something that may end up saving your life, right?  We will be happy to help you get it scheduled when you are ready.    Be sure to check your insurance coverage so you understand how much it will cost.  It may be covered as a preventative service at no cost, but you should check your particular policy.      Breast Self-Awareness Breast self-awareness means being familiar with how your breasts look and feel. It involves checking your breasts regularly and reporting any changes to your health care provider. Practicing breast self-awareness is important. A change in your breasts can be a sign of a serious medical problem. Being familiar with how your breasts look and feel allows  you to find any problems early, when treatment is more likely to be successful. All women should practice breast self-awareness, including women who have had breast implants. How to do a breast self-exam One way to learn what is normal for your breasts and whether your breasts are changing is to do a breast self-exam. To do a breast self-exam: Look for Changes  1. Remove all the clothing above your waist. 2. Stand in front of a mirror in a room with good lighting. 3. Put your hands on your hips. 4. Push your hands firmly downward. 5. Compare your breasts in the mirror. Look for differences between them (asymmetry), such as: ? Differences in shape. ? Differences in size. ? Puckers, dips, and bumps in one breast and not the other. 6. Look at each breast for changes in your skin, such as: ? Redness. ? Scaly areas. 7. Look for changes in your nipples, such as: ? Discharge. ? Bleeding. ? Dimpling. ? Redness. ? A change in position. Feel for Changes  Carefully feel your breasts for lumps and changes. It is best to do this while lying on your back on the floor and again while sitting or standing in the shower or tub with soapy water on your skin. Feel each breast in the following way:  Place the arm on the side of the breast you are examining above your head.  Feel your breast with the other hand.  Start in the nipple area and make  inch (2 cm) overlapping circles to feel your breast. Use the pads of your three middle fingers to do this. Apply light pressure, then medium pressure, then firm pressure. The light pressure will allow you to feel the tissue closest to the skin. The medium pressure will allow you to feel the tissue that is a little deeper. The firm pressure will allow you to feel the tissue close to the ribs.  Continue the overlapping circles, moving downward over the breast until you feel your ribs below your breast.  Move one finger-width toward the center of the body.  Continue to use the  inch (2 cm) overlapping circles to feel your breast as you move slowly up toward your collarbone.  Continue the up and down exam using all three pressures until you reach your armpit.  Write Down What You Find  Write down what is normal for each breast and any changes that you find. Keep a written record with breast changes or normal findings for each breast. By writing this information down, you do not need to depend only on memory for size, tenderness, or location. Write down where you are in your menstrual cycle, if you are still menstruating. If you are having trouble noticing differences in your breasts, do not get discouraged. With time you will become more familiar with the variations in your breasts and more comfortable with the exam. How often should I examine my breasts? Examine   your breasts every month. If you are breastfeeding, the best time to examine your breasts is after a feeding or after using a breast pump. If you menstruate, the best time to examine your breasts is 5-7 days after your period is over. During your period, your breasts are lumpier, and it may be more difficult to notice changes. When should I see my health care provider? See your health care provider if you notice:  A change in shape or size of your breasts or nipples.  A change in the skin of your breast or nipples, such as a reddened or scaly area.  Unusual discharge from your nipples.  A lump or thick area that was not there before.  Pain in your breasts.  Anything that concerns you.  This information is not intended to replace advice given to you by your health care provider. Make sure you discuss any questions you have with your health care provider. Document Released: 06/23/2005 Document Revised: 11/29/2015 Document Reviewed: 05/13/2015 [employer] Interactive Patient Education  2018 [employer]  
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
## 2018-04-02 ENCOUNTER — Ambulatory Visit: Payer: BLUE CROSS/BLUE SHIELD | Admitting: Family Medicine

## 2018-04-02 ENCOUNTER — Encounter: Payer: Self-pay | Admitting: Family Medicine

## 2018-04-02 ENCOUNTER — Other Ambulatory Visit: Payer: Self-pay | Admitting: Family Medicine

## 2018-04-02 NOTE — Telephone Encounter (Signed)
MEDICATION:  traMADol (ULTRAM) 50 MG tablet zolpidem (AMBIEN) 10 MG tablet  PHARMACY:   [id number] - [place], [place] [address] [phone] (Phone) [phone] (Fax)     IS THIS A 90 DAY SUPPLY : Y  IS PATIENT OUT OF MEDICATION: Y  IF NOT; HOW MUCH IS LEFT:   LAST APPOINTMENT DATE: @9 /01/2018  NEXT APPOINTMENT DATE:@9 /30/2019  OTHER COMMENTS:    **Let patient know to contact pharmacy at the end of the day to make sure medication is ready. **  ** Please notify patient to allow 48-72 hours to process**  **Encourage patient to contact the pharmacy for refills or they can request refills through [facility]**

## 2018-04-04 NOTE — Progress Notes (Signed)
[name] is a 42 y.o. female is here for follow up.  History of Present Illness:   [name], CMA acting as scribe for Dr. Briscoe Deutscher.   HPI: Patient in office for follow up. She has seen Lattie Haw. She also wanted to talk about change in medications. She has been on Phentermine in the past but had been on it for a few months. She did like the way it worked. She has been off of it for about two weeks. She has still been working on eating habits even with out the medications but has noticed an increase in her appetite.   Cpap: She tried to use for several months but the mask made her very uncomfortable and she was not able to sleep so she stopped using. She would like to try something different. She returned her machine.   Health Maintenance Due  Topic Date Due  . HIV Screening  11/18/1988  . TETANUS/TDAP  11/18/1992   Depression screen [facility] 2/9 04/05/2018 02/24/2018 12/30/2017  Decreased Interest 2 3 1   Down, Depressed, Hopeless 1 3 3   PHQ - 2 Score 3 6 4   Altered sleeping 3 3 1   Tired, decreased energy 2 2 3   Change in appetite 1 0 1  Feeling bad or failure about yourself  1 3 1   Trouble concentrating 1 3 1   Moving slowly or fidgety/restless 2 0 0  Suicidal thoughts 1 0 1  PHQ-9 Score 14 17 12   Difficult doing work/chores Extremely dIfficult Very difficult Very difficult  Some recent data might be hidden   PMHx, SurgHx, SocialHx, FamHx, Medications, and Allergies were reviewed in the Visit Navigator and updated as appropriate.   Patient Active Problem List   Diagnosis Date Noted  . Inflammatory arthritis 01/27/2018  . Rheumatoid arthritis ([place]) 01/27/2018  . OSA (obstructive sleep apnea) 08/15/2017  . Hip flexor tightness, left 08/04/2017  . Neck pain 08/04/2017  . Hypokalemia 07/29/2017  . Vitamin D deficiency 02/19/2017  . Fatigue 01/28/2017  . Polyarthralgia 01/28/2017  . Cognitive change 01/28/2017  . Depression 01/28/2017  . Obesity (BMI 30-39.9)  01/28/2017  . Primary insomnia 01/28/2017  . Gastroesophageal reflux disease without esophagitis 01/28/2017  . Malignant neoplasm of upper-outer quadrant of left breast in female, estrogen receptor positive ([place]) 07/29/2016  . Genetic testing 01/07/2016  . Breast cancer of upper-outer quadrant of left female breast ([place]) 12/11/2015  . Endometriosis 05/12/2014  . History of hysterectomy for benign disease 05/12/2014   Social History   Tobacco Use  . Smoking status: Never Smoker  . Smokeless tobacco: Never Used  Substance Use Topics  . Alcohol use: Yes    Alcohol/week: 0.0 standard drinks    Comment: socially   . Drug use: No   Current Medications and Allergies:   .  baclofen (LIORESAL) 10 MG tablet, Take 1 tablet (10 mg total) by mouth 3 (three) times daily., Disp: 30 each, Rfl: 0 .  folic acid (FOLVITE) 1 MG tablet, Take 1 mg by mouth daily., Disp: , Rfl: 3 .  LORazepam (ATIVAN) 0.5 MG tablet, Take by mouth., Disp: , Rfl:  .  methocarbamol (ROBAXIN) 500 MG tablet, Take by mouth., Disp: , Rfl:  .  methotrexate (RHEUMATREX) 2.5 MG tablet, Take 2.5 mg by mouth once a week. Caution:Chemotherapy. Protect from light. Take 6 tab weekly, Disp: , Rfl:  .  omeprazole (PRILOSEC) 40 MG capsule, Take 1 tablet before breakfast and before dinner., Disp: 180 capsule, Rfl: 3 .  phentermine (ADIPEX-P) 37.5 MG tablet, Take 1 tablet (37.5 mg total) by mouth daily before breakfast., Disp: 30 tablet, Rfl: 2 .  predniSONE (DELTASONE) 10 MG tablet, TAKE 1 TABLET BY MOUTH DAILY FOR 8 WEEKS--- CURRENTLY PER PT IS TAKING HALF TABLET=5MG --- TAKES IN AM, Disp: , Rfl: 0 .  propranolol ER (INDERAL LA) 60 MG 24 hr capsule, Take 1 capsule (60 mg total) by mouth daily., Disp: 30 capsule, Rfl: 2 .  RESTASIS MULTIDOSE 0.05 % ophthalmic emulsion, Place 1 drop into both eyes 2 (two) times daily., Disp: , Rfl: 0 .  spironolactone (ALDACTONE) 25 MG tablet, TAKE 1 TABLET BY MOUTH EVERY DAY, Disp: 30 tablet, Rfl: 3 .   SUMAtriptan (IMITREX) 100 MG tablet, Take 1 tablet earliest onset of headache.  May repeat x1 in 2 hours if headache persists or recurs.  Do not exceed 2 tablets in 24 hours., Disp: 10 tablet, Rfl: 2 .  topiramate (TOPAMAX) 25 MG tablet, Week 1: 1 tablet pm; Week 2: 1 tablet am and 1 tablet pm; Week 3: 1 tablet am and 2 tablets pm; Week 4: 2 tablets am and 2 tablets pm, Disp: , Rfl:  .  traMADol (ULTRAM) 50 MG tablet, Take 1 tablet (50 mg total) by mouth every 8 (eight) hours as needed., Disp: 30 tablet, Rfl: 0 .  venlafaxine XR (EFFEXOR XR) 37.5 MG 24 hr capsule, Take 1 capsule (37.5 mg total) by mouth daily with breakfast., Disp: 90 capsule, Rfl: 0 .  zolpidem (AMBIEN) 10 MG tablet, Take 1/2 to 1 tab po qhs prn, Disp: , Rfl:    Allergies  Allergen Reactions  . Amoxicillin Shortness Of Breath and Itching    Has patient had a PCN reaction causing immediate rash, facial/tongue/throat swelling, SOB or lightheadedness with hypotension: Yes Has patient had a PCN reaction causing severe rash involving mucus membranes or skin necrosis: No Has patient had a PCN reaction that required hospitalization Yes Has patient had a PCN reaction occurring within the last 10 years: No If all of the above answers are "NO", then may proceed with Cephalosporin use.   . Diflucan [Fluconazole] Nausea Only    heartburn   Review of Systems   Pertinent items are noted in the HPI. Otherwise, ROS is negative.  Vitals:   Vitals:   04/05/18 1512  BP: 120/76  Pulse: 100  Temp: 98 F (36.7 C)  TempSrc: Oral  SpO2: 96%  Weight: 206 lb 3.2 oz (93.5 kg)  Height: 5\' 3"  (1.6 m)     Body mass index is 36.53 kg/m.  Physical Exam:   Physical Exam  Constitutional: She appears well-nourished.  HENT:  Head: Normocephalic and atraumatic.  Eyes: Pupils are equal, round, and reactive to light. EOM are normal.  Neck: Normal range of motion. Neck supple.  Cardiovascular: Normal rate, regular rhythm, normal heart sounds  and intact distal pulses.  Pulmonary/Chest: Effort normal.  Abdominal: Soft.  Skin: Skin is warm.  Psychiatric: She has a normal mood and affect. Her behavior is normal.  Nursing note and vitals reviewed.   Results for orders placed or performed in visit on 01/06/18  CBC and differential  Result Value Ref Range   Hemoglobin 13.0 12.0 - 16.0   HCT 38 36 - 46   Platelets 336 150 - 399   WBC 7.5   Basic metabolic panel  Result Value Ref Range   Glucose 95    BUN 12 4 - 21   Creatinine 0.9 0.5 - 1.1  Potassium 4.5 3.4 - 5.3   Sodium 139 137 - 147  Hepatic function panel  Result Value Ref Range   Alkaline Phosphatase 88 25 - 125   ALT 22 7 - 35   AST 14 13 - 35   Bilirubin, Total 0.2     Assessment and Plan:   [name] was seen today for follow-up.  Diagnoses and all orders for this visit:  Rheumatoid arthritis, involving unspecified site, unspecified rheumatoid factor presence (HCC) -     meloxicam (MOBIC) 15 MG tablet; Take 1 tablet (15 mg total) by mouth daily.  Moderate episode of recurrent major depressive disorder (HCC) -     venlafaxine XR (EFFEXOR XR) 75 MG 24 hr capsule; Take 1 capsule (75 mg total) by mouth daily with breakfast.  Obesity (BMI 30-39.9)  Binge eating disorder -     lisdexamfetamine (VYVANSE) 20 MG capsule; Take 1 capsule (20 mg total) by mouth daily.   . Reviewed expectations re: course of current medical issues. . Discussed self-management of symptoms. . Outlined signs and symptoms indicating need for more acute intervention. . Patient verbalized understanding and all questions were answered. Marland Kitchen Health Maintenance issues including appropriate healthy diet, exercise, and smoking avoidance were discussed with patient. . See orders for this visit as documented in the electronic medical record. . Patient received an After Visit Summary.  CMA served as Education administrator during this visit. History, Physical, and Plan performed by medical provider. The above  documentation has been reviewed and is accurate and complete. Briscoe Deutscher, D.O.  Briscoe Deutscher, DO [place], [facility] 04/05/2018

## 2018-04-05 ENCOUNTER — Encounter: Payer: Self-pay | Admitting: Family Medicine

## 2018-04-05 ENCOUNTER — Ambulatory Visit: Payer: BLUE CROSS/BLUE SHIELD | Admitting: Family Medicine

## 2018-04-05 VITALS — BP 120/76 | HR 100 | Temp 98.0°F | Ht 63.0 in | Wt 206.2 lb

## 2018-04-05 DIAGNOSIS — M069 Rheumatoid arthritis, unspecified: Secondary | ICD-10-CM | POA: Diagnosis not present

## 2018-04-05 DIAGNOSIS — E669 Obesity, unspecified: Secondary | ICD-10-CM

## 2018-04-05 DIAGNOSIS — F331 Major depressive disorder, recurrent, moderate: Secondary | ICD-10-CM | POA: Diagnosis not present

## 2018-04-05 DIAGNOSIS — F5081 Binge eating disorder: Secondary | ICD-10-CM | POA: Diagnosis not present

## 2018-04-05 MED ORDER — VENLAFAXINE HCL ER 75 MG PO CP24: 75.0000 mg | ORAL_CAPSULE | Freq: Every day | ORAL | 0 refills | Status: DC

## 2018-04-05 MED ORDER — LISDEXAMFETAMINE DIMESYLATE 20 MG PO CAPS: 20.0000 mg | ORAL_CAPSULE | Freq: Every day | ORAL | 0 refills | Status: DC

## 2018-04-05 MED ORDER — ZOLPIDEM TARTRATE 10 MG PO TABS: ORAL_TABLET | ORAL | 5 refills | Status: DC

## 2018-04-05 MED ORDER — MELOXICAM 15 MG PO TABS: 15.0000 mg | ORAL_TABLET | Freq: Every day | ORAL | 0 refills | Status: DC

## 2018-04-05 MED ORDER — TRAMADOL HCL 50 MG PO TABS: 50.0000 mg | ORAL_TABLET | Freq: Three times a day (TID) | ORAL | 0 refills | Status: DC | PRN

## 2018-04-05 NOTE — Addendum Note (Signed)
Addended by: Francella Solian on: 04/05/2018 09:22 AM   Modules accepted: Orders

## 2018-04-07 ENCOUNTER — Other Ambulatory Visit: Payer: Self-pay | Admitting: Neurology

## 2018-04-28 ENCOUNTER — Ambulatory Visit: Payer: BLUE CROSS/BLUE SHIELD | Admitting: Psychology

## 2018-04-29 ENCOUNTER — Other Ambulatory Visit: Payer: Self-pay | Admitting: Family Medicine

## 2018-04-29 DIAGNOSIS — M069 Rheumatoid arthritis, unspecified: Secondary | ICD-10-CM

## 2018-04-30 ENCOUNTER — Other Ambulatory Visit: Payer: Self-pay | Admitting: Neurology

## 2018-04-30 NOTE — Progress Notes (Signed)
Refused Rx for Propranolol. Pt has not kept appt x3 with Dr Kathlen Mody, they have been unable to reach her. I called and left a message for her as well. She has an appt here on 05/07/18. I am attempting to get the Pt to call the office. I will refill the propranolol on Monday.

## 2018-05-06 NOTE — Progress Notes (Signed)
CHARLIEGH VASUDEVAN is a 42 y.o. female is here for follow up.  History of Present Illness:   HPI: See Assessment and Plan section for Problem Based Charting of issues discussed today.   Health Maintenance Due  Topic Date Due  . HIV Screening  11/18/1988   Depression screen [facility] 2/9 04/05/2018 02/24/2018 12/30/2017  Decreased Interest 2 3 1   Down, Depressed, Hopeless 1 3 3   PHQ - 2 Score 3 6 4   Altered sleeping 3 3 1   Tired, decreased energy 2 2 3   Change in appetite 1 0 1  Feeling bad or failure about yourself  1 3 1   Trouble concentrating 1 3 1   Moving slowly or fidgety/restless 2 0 0  Suicidal thoughts 1 0 1  PHQ-9 Score 14 17 12   Difficult doing work/chores Extremely dIfficult Very difficult Very difficult  Some recent data might be hidden   PMHx, SurgHx, SocialHx, FamHx, Medications, and Allergies were reviewed in the Visit Navigator and updated as appropriate.   Patient Active Problem List   Diagnosis Date Noted  . Binge eating disorder 05/08/2018  . Chronic migraine w/o aura w/o status migrainosus, not intractable 05/08/2018  . Inflammatory arthritis 01/27/2018  . Rheumatoid arthritis ([place]) 01/27/2018  . OSA (obstructive sleep apnea), could not tolerate CPAP 08/15/2017  . Hip flexor tightness, left 08/04/2017  . Neck pain 08/04/2017  . Vitamin D deficiency 02/19/2017  . Fatigue 01/28/2017  . Depression, major, single episode, in partial remission ([place]) 01/28/2017  . Obesity (BMI 30-39.9) 01/28/2017  . Primary insomnia 01/28/2017  . Gastroesophageal reflux disease without esophagitis 01/28/2017  . Malignant neoplasm of upper-outer quadrant of left breast in female, estrogen receptor positive ([place]) 07/29/2016  . Genetic testing 01/07/2016  . Breast cancer of upper-outer quadrant of left female breast ([place]) 12/11/2015  . Endometriosis 05/12/2014  . History of hysterectomy for benign disease 05/12/2014   Social History   Tobacco Use  . Smoking status: Never Smoker    . Smokeless tobacco: Never Used  Substance Use Topics  . Alcohol use: Yes    Alcohol/week: 0.0 standard drinks    Comment: socially   . Drug use: No   Current Medications and Allergies:   Current Outpatient Medications:  .  folic acid (FOLVITE) 1 MG tablet, Take 1 mg by mouth daily., Disp: , Rfl: 3 .  gabapentin (NEURONTIN) 100 MG capsule, Take 3 capsules by mouth daily., Disp: , Rfl: 0 .  leflunomide (ARAVA) 10 MG tablet, , Disp: , Rfl: 0 .  LORazepam (ATIVAN) 0.5 MG tablet, Take by mouth., Disp: , Rfl:  .  meloxicam (MOBIC) 15 MG tablet, TAKE 1 TABLET BY MOUTH EVERY DAY, Disp: 30 tablet, Rfl: 0 .  methocarbamol (ROBAXIN) 500 MG tablet, Take by mouth., Disp: , Rfl:  .  methotrexate (RHEUMATREX) 2.5 MG tablet, Take 2.5 mg by mouth once a week. Caution:Chemotherapy. Protect from light. Take 6 tab weekly, Disp: , Rfl:  .  omeprazole (PRILOSEC) 40 MG capsule, Take 1 tablet before breakfast and before dinner., Disp: 180 capsule, Rfl: 3 .  RESTASIS MULTIDOSE 0.05 % ophthalmic emulsion, Place 1 drop into both eyes 2 (two) times daily., Disp: , Rfl: 0 .  spironolactone (ALDACTONE) 25 MG tablet, TAKE 1 TABLET BY MOUTH EVERY DAY, Disp: 30 tablet, Rfl: 3 .  SUMAtriptan (IMITREX) 100 MG tablet, Take 1 tablet earliest onset of headache.  May repeat x1 in 2 hours if headache persists or recurs.  Do not exceed 2 tablets in  24 hours., Disp: 10 tablet, Rfl: 2 .  topiramate (TOPAMAX) 25 MG tablet, Take 50 mg by mouth 2 (two) times daily. , Disp: , Rfl:  .  traMADol (ULTRAM) 50 MG tablet, Take 1 tablet (50 mg total) by mouth every 8 (eight) hours as needed., Disp: 90 tablet, Rfl: 0 .  venlafaxine XR (EFFEXOR XR) 75 MG 24 hr capsule, Take 1 capsule (75 mg total) by mouth daily with breakfast., Disp: 90 capsule, Rfl: 0 .  zolpidem (AMBIEN) 10 MG tablet, Take 1/2 to 1 tab po qhs prn, Disp: 30 tablet, Rfl: 5 .  lisdexamfetamine (VYVANSE) 30 MG capsule, Take 1 capsule (30 mg total) by mouth daily before  breakfast., Disp: 30 capsule, Rfl: 0 .  [START ON 07/08/2018] lisdexamfetamine (VYVANSE) 30 MG capsule, Take 1 capsule (30 mg total) by mouth daily before breakfast., Disp: 30 capsule, Rfl: 0 .  predniSONE (DELTASONE) 5 MG tablet, Take 5 mg by mouth daily with breakfast., Disp: , Rfl:  .  propranolol ER (INDERAL LA) 80 MG 24 hr capsule, Take 1 capsule (80 mg total) by mouth daily., Disp: 30 capsule, Rfl: 3   Allergies  Allergen Reactions  . Amoxicillin Shortness Of Breath and Itching    Has patient had a PCN reaction causing immediate rash, facial/tongue/throat swelling, SOB or lightheadedness with hypotension: Yes Has patient had a PCN reaction causing severe rash involving mucus membranes or skin necrosis: No Has patient had a PCN reaction that required hospitalization Yes Has patient had a PCN reaction occurring within the last 10 years: No If all of the above answers are "NO", then may proceed with Cephalosporin use.   . Diflucan [Fluconazole] Nausea Only    heartburn   Review of Systems   Pertinent items are noted in the HPI. Otherwise, ROS is negative.  Vitals:   Vitals:   05/07/18 1316  BP: 130/86  Pulse: 74  Resp: 16  Temp: (!) 97.4 F (36.3 C)  TempSrc: Oral  SpO2: 98%  Weight: 207 lb (93.9 kg)     Body mass index is 36.67 kg/m.  Physical Exam:   Physical Exam  Constitutional: She appears well-nourished.  HENT:  Head: Normocephalic and atraumatic.  Eyes: Pupils are equal, round, and reactive to light. EOM are normal.  Neck: Normal range of motion. Neck supple.  Cardiovascular: Normal rate, regular rhythm, normal heart sounds and intact distal pulses.  Pulmonary/Chest: Effort normal.  Abdominal: Soft.  Skin: Skin is warm.  Psychiatric: She has a normal mood and affect. Her behavior is normal.  Nursing note and vitals reviewed.  Assessment and Plan:   Rheumatoid arthritis ([place]) Current Outpatient Medications:  .  gabapentin (NEURONTIN) 100 MG capsule,  Take 3 capsules by mouth daily., Disp: , Rfl: 0 .  leflunomide (ARAVA) 10 MG tablet, , Disp: , Rfl: 0 .  meloxicam (MOBIC) 15 MG tablet, TAKE 1 TABLET BY MOUTH EVERY DAY, Disp: 30 tablet, Rfl: 0  .  methotrexate (RHEUMATREX) 2.5 MG tablet, Take 2.5 mg by mouth once a week. Caution:Chemotherapy. Protect from light. Take 6 tab weekly, Disp: , Rfl:  .  traMADol (ULTRAM) 50 MG tablet, Take 1 tablet (50 mg total) by mouth every 8 (eight) hours as needed., Disp: 90 tablet, Rfl: 0 .  predniSONE (DELTASONE) 5 MG tablet, Take 5 mg by mouth daily with breakfast., Disp: , Rfl:   Followed by Rheumatology. Medications are still being adjusted. Pain moderately controlled. Musculoskeletal ROS: no joint effusions.  Primary insomnia Associated symptoms include  daytime somnolence. Average duration of sleep 6 hours/night with 1-2 awakenings/night. The patient has been taking: Ambien prn. Side effects from the medication: none.   Plan: 1. Discussed sleep hygiene measures including regular sleep schedule, optimal sleep environment, and relaxing presleep rituals. 2. Avoid daytime naps. 3. Avoid caffeine after noon. 4. Avoid excess alcohol. 5. Avoid tobacco. 6. Recommended daily exercise. 7. Scientist, clinical (histocompatibility and immunogenetics) distributed.  Obesity (BMI 30-39.9) Wt Readings from Last 3 Encounters:  05/07/18 207 lb (93.9 kg)  05/07/18 207 lb (93.9 kg)  04/05/18 206 lb 3.2 oz (93.5 kg)    Plan: The patient is asked to make an attempt to improve diet and exercise patterns to aid in medical management of this problem. Recheck at next visit.   Depression, major, single episode, in partial remission ([place]) Long history of the same with previous suicidal ideations and attempts. Patient is very open in speaking about this. Stressors include chronic medical conditions, pain, and marriage issues. Currently working with Trey Paula, LCSW. Involved in a few breast cancer survivor groups. Today she denies any concerns. She is stable  on her current medications.  Current Outpatient Medications:  .  LORazepam (ATIVAN) 0.5 MG tablet, Take by mouth., Disp: , Rfl:  .  venlafaxine XR (EFFEXOR XR) 75 MG 24 hr capsule, Take 1 capsule (75 mg total) by mouth daily with breakfast., Disp: 90 capsule, Rfl: 0  Chronic migraine w/o aura w/o status migrainosus, not intractable Followed by Neurology. Thorough workup. Continue present treatment and plan.   Binge eating disorder Improved since using Vyvanse. It has also helped with focus and memory. After discussion, patient would like to increase the Vyvanse to 30 mg q am. Expectations, risks, and potential side effects reviewed.    Meds ordered this encounter  Medications  . lisdexamfetamine (VYVANSE) 30 MG capsule    Sig: Take 1 capsule (30 mg total) by mouth daily before breakfast.    Dispense:  30 capsule    Refill:  0  . DISCONTD: lisdexamfetamine (VYVANSE) 30 MG capsule    Sig: Take 1 capsule (30 mg total) by mouth daily before breakfast.    Dispense:  30 capsule    Refill:  0  . lisdexamfetamine (VYVANSE) 30 MG capsule    Sig: Take 1 capsule (30 mg total) by mouth daily before breakfast.    Dispense:  30 capsule    Refill:  0   . Reviewed expectations re: course of current medical issues. . Discussed self-management of symptoms. . Outlined signs and symptoms indicating need for more acute intervention. . Patient verbalized understanding and all questions were answered. Marland Kitchen Health Maintenance issues including appropriate healthy diet, exercise, and smoking avoidance were discussed with patient. . See orders for this visit as documented in the electronic medical record. . Patient received an After Visit Summary.  Briscoe Deutscher, DO [place], [facility] 05/08/2018

## 2018-05-07 ENCOUNTER — Encounter: Payer: Self-pay | Admitting: Family Medicine

## 2018-05-07 ENCOUNTER — Ambulatory Visit (INDEPENDENT_AMBULATORY_CARE_PROVIDER_SITE_OTHER): Payer: BLUE CROSS/BLUE SHIELD | Admitting: Family Medicine

## 2018-05-07 ENCOUNTER — Encounter: Payer: Self-pay | Admitting: Neurology

## 2018-05-07 ENCOUNTER — Ambulatory Visit (INDEPENDENT_AMBULATORY_CARE_PROVIDER_SITE_OTHER): Payer: BLUE CROSS/BLUE SHIELD | Admitting: Neurology

## 2018-05-07 VITALS — BP 110/68 | HR 74 | Ht 64.0 in | Wt 207.0 lb

## 2018-05-07 VITALS — BP 130/86 | HR 74 | Temp 97.4°F | Resp 16 | Wt 207.0 lb

## 2018-05-07 DIAGNOSIS — F5081 Binge eating disorder: Secondary | ICD-10-CM

## 2018-05-07 DIAGNOSIS — F5101 Primary insomnia: Secondary | ICD-10-CM

## 2018-05-07 DIAGNOSIS — F324 Major depressive disorder, single episode, in partial remission: Secondary | ICD-10-CM

## 2018-05-07 DIAGNOSIS — G43709 Chronic migraine without aura, not intractable, without status migrainosus: Secondary | ICD-10-CM

## 2018-05-07 DIAGNOSIS — F50819 Binge eating disorder, unspecified: Secondary | ICD-10-CM

## 2018-05-07 DIAGNOSIS — G4733 Obstructive sleep apnea (adult) (pediatric): Secondary | ICD-10-CM | POA: Diagnosis not present

## 2018-05-07 DIAGNOSIS — E669 Obesity, unspecified: Secondary | ICD-10-CM

## 2018-05-07 DIAGNOSIS — M069 Rheumatoid arthritis, unspecified: Secondary | ICD-10-CM | POA: Diagnosis not present

## 2018-05-07 DIAGNOSIS — N809 Endometriosis, unspecified: Secondary | ICD-10-CM

## 2018-05-07 DIAGNOSIS — M2550 Pain in unspecified joint: Secondary | ICD-10-CM

## 2018-05-07 MED ORDER — LISDEXAMFETAMINE DIMESYLATE 30 MG PO CAPS: 30.0000 mg | ORAL_CAPSULE | Freq: Every day | ORAL | 0 refills | Status: DC

## 2018-05-07 MED ORDER — PROPRANOLOL HCL ER 80 MG PO CP24: 80.0000 mg | ORAL_CAPSULE | Freq: Every day | ORAL | 3 refills | Status: DC

## 2018-05-07 NOTE — Patient Instructions (Signed)
Stop the Baclofen.
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
## 2018-05-07 NOTE — Patient Instructions (Signed)
1.  For preventative management, we will increase propranolol ER to 80mg  daily 2.  For abortive therapy, sumatriptan 3.  Limit use of pain relievers to no more than 2 days out of week to prevent risk of rebound or medication-overuse headache. 4.  Keep headache diary 5.  Exercise, hydration, caffeine cessation, sleep hygiene, monitor for and avoid triggers 6.  Consider:  magnesium citrate 400mg  daily, riboflavin 400mg  daily, and coenzyme Q10 100mg  three times daily 7.  Follow up in 4 months

## 2018-05-08 DIAGNOSIS — F5081 Binge eating disorder: Secondary | ICD-10-CM | POA: Insufficient documentation

## 2018-05-08 DIAGNOSIS — G43709 Chronic migraine without aura, not intractable, without status migrainosus: Secondary | ICD-10-CM | POA: Insufficient documentation

## 2018-05-08 NOTE — Assessment & Plan Note (Signed)
Improved since using Vyvanse. It has also helped with focus and memory. After discussion, patient would like to increase the Vyvanse to 30 mg q am. Expectations, risks, and potential side effects reviewed.

## 2018-05-08 NOTE — Assessment & Plan Note (Signed)
Followed by Neurology. Thorough workup. Continue present treatment and plan.

## 2018-05-08 NOTE — Assessment & Plan Note (Signed)
Current Outpatient Medications:  .  gabapentin (NEURONTIN) 100 MG capsule, Take 3 capsules by mouth daily., Disp: , Rfl: 0 .  leflunomide (ARAVA) 10 MG tablet, , Disp: , Rfl: 0 .  meloxicam (MOBIC) 15 MG tablet, TAKE 1 TABLET BY MOUTH EVERY DAY, Disp: 30 tablet, Rfl: 0  .  methotrexate (RHEUMATREX) 2.5 MG tablet, Take 2.5 mg by mouth once a week. Caution:Chemotherapy. Protect from light. Take 6 tab weekly, Disp: , Rfl:  .  traMADol (ULTRAM) 50 MG tablet, Take 1 tablet (50 mg total) by mouth every 8 (eight) hours as needed., Disp: 90 tablet, Rfl: 0 .  predniSONE (DELTASONE) 5 MG tablet, Take 5 mg by mouth daily with breakfast., Disp: , Rfl:   Followed by Rheumatology. Medications are still being adjusted. Pain moderately controlled. Musculoskeletal ROS: no joint effusions.

## 2018-05-08 NOTE — Assessment & Plan Note (Signed)
Wt Readings from Last 3 Encounters:  05/07/18 207 lb (93.9 kg)  05/07/18 207 lb (93.9 kg)  04/05/18 206 lb 3.2 oz (93.5 kg)    Plan: The patient is asked to make an attempt to improve diet and exercise patterns to aid in medical management of this problem. Recheck at next visit.

## 2018-05-08 NOTE — Assessment & Plan Note (Signed)
Long history of the same with previous suicidal ideations and attempts. Patient is very open in speaking about this. Stressors include chronic medical conditions, pain, and marriage issues. Currently working with Trey Paula, LCSW. Involved in a few breast cancer survivor groups. Today she denies any concerns. She is stable on her current medications.  Current Outpatient Medications:  .  LORazepam (ATIVAN) 0.5 MG tablet, Take by mouth., Disp: , Rfl:  .  venlafaxine XR (EFFEXOR XR) 75 MG 24 hr capsule, Take 1 capsule (75 mg total) by mouth daily with breakfast., Disp: 90 capsule, Rfl: 0

## 2018-05-08 NOTE — Assessment & Plan Note (Signed)
Associated symptoms include daytime somnolence. Average duration of sleep 6 hours/night with 1-2 awakenings/night. The patient has been taking: Ambien prn. Side effects from the medication: none.   Plan: 1. Discussed sleep hygiene measures including regular sleep schedule, optimal sleep environment, and relaxing presleep rituals. 2. Avoid daytime naps. 3. Avoid caffeine after noon. 4. Avoid excess alcohol. 5. Avoid tobacco. 6. Recommended daily exercise. 7. Scientist, clinical (histocompatibility and immunogenetics) distributed.

## 2018-05-08 NOTE — Assessment & Plan Note (Deleted)
Compliant. No concerns.

## 2018-05-11 ENCOUNTER — Ambulatory Visit: Payer: BLUE CROSS/BLUE SHIELD | Admitting: Psychology

## 2018-05-18 ENCOUNTER — Other Ambulatory Visit: Payer: Self-pay | Admitting: Family Medicine

## 2018-05-18 DIAGNOSIS — F321 Major depressive disorder, single episode, moderate: Secondary | ICD-10-CM

## 2018-05-22 ENCOUNTER — Other Ambulatory Visit: Payer: Self-pay | Admitting: Family Medicine

## 2018-05-25 ENCOUNTER — Ambulatory Visit (INDEPENDENT_AMBULATORY_CARE_PROVIDER_SITE_OTHER): Payer: BLUE CROSS/BLUE SHIELD | Admitting: Psychology

## 2018-05-25 DIAGNOSIS — F321 Major depressive disorder, single episode, moderate: Secondary | ICD-10-CM | POA: Diagnosis not present

## 2018-06-06 NOTE — Progress Notes (Signed)
no show

## 2018-06-07 ENCOUNTER — Other Ambulatory Visit: Payer: Self-pay | Admitting: Neurology

## 2018-06-08 ENCOUNTER — Ambulatory Visit (INDEPENDENT_AMBULATORY_CARE_PROVIDER_SITE_OTHER): Payer: BLUE CROSS/BLUE SHIELD | Admitting: Psychology

## 2018-06-08 ENCOUNTER — Encounter: Payer: Self-pay | Admitting: Oncology

## 2018-06-08 ENCOUNTER — Telehealth: Payer: Self-pay | Admitting: Oncology

## 2018-06-08 ENCOUNTER — Other Ambulatory Visit: Payer: Self-pay | Admitting: *Deleted

## 2018-06-08 ENCOUNTER — Inpatient Hospital Stay: Payer: BLUE CROSS/BLUE SHIELD | Admitting: Oncology

## 2018-06-08 ENCOUNTER — Inpatient Hospital Stay: Payer: BLUE CROSS/BLUE SHIELD

## 2018-06-08 ENCOUNTER — Telehealth: Payer: Self-pay | Admitting: Adult Health

## 2018-06-08 DIAGNOSIS — F321 Major depressive disorder, single episode, moderate: Secondary | ICD-10-CM

## 2018-06-08 MED ORDER — PROPRANOLOL HCL ER 80 MG PO CP24: 80.0000 mg | ORAL_CAPSULE | Freq: Every day | ORAL | 5 refills | Status: DC

## 2018-06-08 NOTE — Telephone Encounter (Signed)
Patient stopped by to reschedule her appt from 12/9. Gave patient calendar.

## 2018-06-08 NOTE — Telephone Encounter (Signed)
Tried to reach regarding voicemail °

## 2018-06-14 ENCOUNTER — Telehealth: Payer: Self-pay | Admitting: Adult Health

## 2018-06-14 ENCOUNTER — Ambulatory Visit: Payer: Self-pay | Admitting: Adult Health

## 2018-06-14 ENCOUNTER — Other Ambulatory Visit: Payer: Self-pay

## 2018-06-14 NOTE — Telephone Encounter (Signed)
Called patient per scheduling voicemail log, and per checking chart the patient appointment was already rescheduled and printed out per her coming in to reschedule.

## 2018-06-18 ENCOUNTER — Other Ambulatory Visit: Payer: Self-pay | Admitting: Family Medicine

## 2018-06-18 DIAGNOSIS — F329 Major depressive disorder, single episode, unspecified: Secondary | ICD-10-CM

## 2018-06-21 ENCOUNTER — Inpatient Hospital Stay (HOSPITAL_BASED_OUTPATIENT_CLINIC_OR_DEPARTMENT_OTHER): Payer: BLUE CROSS/BLUE SHIELD | Admitting: Adult Health

## 2018-06-21 ENCOUNTER — Encounter: Payer: Self-pay | Admitting: Adult Health

## 2018-06-21 ENCOUNTER — Telehealth: Payer: Self-pay | Admitting: Oncology

## 2018-06-21 ENCOUNTER — Inpatient Hospital Stay: Payer: BLUE CROSS/BLUE SHIELD | Attending: Oncology

## 2018-06-21 VITALS — BP 137/96 | HR 75 | Temp 98.2°F | Resp 18 | Ht 64.0 in | Wt 207.5 lb

## 2018-06-21 DIAGNOSIS — C50412 Malignant neoplasm of upper-outer quadrant of left female breast: Secondary | ICD-10-CM

## 2018-06-21 DIAGNOSIS — Z853 Personal history of malignant neoplasm of breast: Secondary | ICD-10-CM

## 2018-06-21 DIAGNOSIS — Z803 Family history of malignant neoplasm of breast: Secondary | ICD-10-CM | POA: Insufficient documentation

## 2018-06-21 DIAGNOSIS — Z170 Estrogen receptor positive status [ER+]: Secondary | ICD-10-CM

## 2018-06-21 DIAGNOSIS — M069 Rheumatoid arthritis, unspecified: Secondary | ICD-10-CM | POA: Diagnosis not present

## 2018-06-21 DIAGNOSIS — Z8052 Family history of malignant neoplasm of bladder: Secondary | ICD-10-CM

## 2018-06-21 DIAGNOSIS — M797 Fibromyalgia: Secondary | ICD-10-CM
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
## 2018-06-21 NOTE — Telephone Encounter (Signed)
Gave avs and calendar ° °

## 2018-06-22 LAB — FOLLICLE STIMULATING HORMONE: FSH: 15.6 m[IU]/mL

## 2018-06-25 ENCOUNTER — Other Ambulatory Visit: Payer: Self-pay | Admitting: Adult Health

## 2018-06-25 DIAGNOSIS — Z170 Estrogen receptor positive status [ER+]: Secondary | ICD-10-CM

## 2018-06-25 DIAGNOSIS — C50412 Malignant neoplasm of upper-outer quadrant of left female breast: Secondary | ICD-10-CM

## 2018-06-25 LAB — ESTRADIOL, ULTRA SENS: Estradiol, Sensitive: 56.3 pg/mL

## 2018-06-28 ENCOUNTER — Other Ambulatory Visit: Payer: Self-pay | Admitting: Family Medicine

## 2018-06-28 DIAGNOSIS — F331 Major depressive disorder, recurrent, moderate: Secondary | ICD-10-CM

## 2018-07-05 ENCOUNTER — Ambulatory Visit: Payer: BLUE CROSS/BLUE SHIELD | Admitting: Family Medicine

## 2018-07-05 ENCOUNTER — Ambulatory Visit
Admission: RE | Admit: 2018-07-05 | Discharge: 2018-07-05 | Disposition: A | Discharge status: Home / Self Care | Payer: BLUE CROSS/BLUE SHIELD | Source: Ambulatory Visit | Attending: Adult Health | Admitting: Adult Health

## 2018-07-05 ENCOUNTER — Encounter: Payer: Self-pay | Admitting: Family Medicine

## 2018-07-05 VITALS — BP 124/72 | HR 69 | Temp 97.8°F | Ht 64.0 in | Wt 205.8 lb

## 2018-07-05 DIAGNOSIS — J329 Chronic sinusitis, unspecified: Secondary | ICD-10-CM

## 2018-07-05 DIAGNOSIS — C50412 Malignant neoplasm of upper-outer quadrant of left female breast: Secondary | ICD-10-CM

## 2018-07-05 DIAGNOSIS — H6593 Unspecified nonsuppurative otitis media, bilateral: Secondary | ICD-10-CM | POA: Diagnosis not present

## 2018-07-05 DIAGNOSIS — Z170 Estrogen receptor positive status [ER+]: Secondary | ICD-10-CM

## 2018-07-05 MED ORDER — IPRATROPIUM BROMIDE 0.06 % NA SOLN: 2.0000 | Freq: Four times a day (QID) | NASAL | 0 refills | Status: DC

## 2018-07-05 MED ORDER — DOXYCYCLINE HYCLATE 100 MG PO TABS: 100.0000 mg | ORAL_TABLET | Freq: Two times a day (BID) | ORAL | 0 refills | Status: DC

## 2018-07-05 NOTE — Patient Instructions (Addendum)
Start the atrovent.  Start the doxycycline if your symptoms worsen or do not improve in a few days.  Please stay well hydrated.  You can take tylenol and/or motrin as needed for low grade fever and pain.  Please let me know if your symptoms worsen or fail to improve.  Take care, Dr Jerline Pain

## 2018-07-05 NOTE — Progress Notes (Signed)
   Subjective:  [name] is a 42 y.o. female who presents today for same-day appointment with a chief complaint of ear pain.   HPI:  Ear Pain, Acute problem Started about a week ago.  Stable over that time.  Symptoms located on the left side of her head and face.  She has tried taking TheraFlu with modest improvement.  Associated with rash and cough as well.  She has hydrocortisone cream for the rash with improvement in her symptoms. She also has had "extreme" dry eyes and used restasis with no improvement. Some associated blurred vision.  She has a history of rheumatoid arthritis and chronic fatigue.  She is interested in possibly being tested for lupus today.No other obvious alleviating or aggravating factors.   ROS: Per HPI  PMH: She reports that she has never smoked. She has never used smokeless tobacco. She reports current alcohol use. She reports that she does not use drugs.  Objective:  Physical Exam: BP 124/72 (BP Location: Right Arm, Patient Position: Sitting, Cuff Size: Normal)   Pulse 69   Temp 97.8 F (36.6 C) (Oral)   Ht 5\' 4"  (1.626 m)   Wt 205 lb 12.8 oz (93.4 kg)   SpO2 97%   BMI 35.33 kg/m   Gen: NAD, resting comfortably HEENT: Right TM bulging and erythematous.  Left TM with clear effusion.  OP erythematous with no exudate.  A few small ulcerations under left aspect of tongue.  Nose mucosa erythematous and boggy bilaterally. CV: RRR with no murmurs appreciated Pulm: NWOB, CTAB with no crackles, wheezes, or rhonchi  Assessment/Plan:  Sinusitis/middle ear effusion Start Atrovent nasal spray.  Sent in a "pocket prescription" for doxycycline with strict instruction to not use unless symptoms worsen or do not improve the next 1 to 2 days.  She has a allergy to amoxicillin.  Recommended good oral hydration.  She is already on Mobic for her rheumatoid arthritis-advised she could take Tylenol in addition.  Discussed reasons return to care and seek emergent care.   Follow-up as needed.  Musculoskeletal pain/rheumatoid arthritis She will follow-up with rheumatologist to discuss further treatment.  Will not screen for lupus today as results would not change her current management.  She is currently on DMARD therapy.  Algis Greenhouse. Jerline Pain, MD 07/05/2018 12:23 PM

## 2018-07-11 ENCOUNTER — Other Ambulatory Visit: Payer: Self-pay | Admitting: Family Medicine

## 2018-07-11 DIAGNOSIS — I10 Essential (primary) hypertension: Secondary | ICD-10-CM

## 2018-07-11 DIAGNOSIS — E876 Hypokalemia: Secondary | ICD-10-CM

## 2018-07-15 ENCOUNTER — Ambulatory Visit: Payer: Self-pay | Admitting: Psychology

## 2018-07-23 ENCOUNTER — Ambulatory Visit: Payer: BLUE CROSS/BLUE SHIELD | Admitting: Family Medicine

## 2018-07-27 ENCOUNTER — Telehealth: Payer: Self-pay | Admitting: Family Medicine

## 2018-07-27 MED ORDER — TRAMADOL HCL 50 MG PO TABS: 50.0000 mg | ORAL_TABLET | Freq: Three times a day (TID) | ORAL | 0 refills | Status: DC | PRN

## 2018-07-27 NOTE — Telephone Encounter (Signed)
Requested medication (s) are due for refill today: yes  Requested medication (s) are on the active medication list: yes  Last refill:  03/09/2018  Future visit scheduled: yes  Notes to clinic:  Not delegated    Requested Prescriptions  Pending Prescriptions Disp Refills   traMADol (ULTRAM) 50 MG tablet 90 tablet 0    Sig: Take 1 tablet (50 mg total) by mouth every 8 (eight) hours as needed.     Not Delegated - Analgesics:  Opioid Agonists Failed - 07/27/2018  8:56 AM      Failed - This refill cannot be delegated      Failed - Urine Drug Screen completed in last 360 days.      Passed - Valid encounter within last 6 months    Recent Outpatient Visits          3 weeks ago Sinusitis, unspecified chronicity, unspecified location   [place] Parker, Algis Greenhouse, MD   2 months ago Depression, major, single episode, in partial remission [facility])   [place] Wallace, [place], DO   3 months ago Rheumatoid arthritis, involving unspecified site, unspecified rheumatoid factor presence ([place])   [place] Wallace, [place], DO   5 months ago Current moderate episode of major depressive disorder without prior episode [facility])   [place] Wallace, [place], DO   6 months ago Reactive depression   [place] Wallace, [place], DO      Future Appointments            In 2 weeks Briscoe Deutscher, [place], [facility]

## 2018-07-27 NOTE — Telephone Encounter (Signed)
See note

## 2018-07-27 NOTE — Telephone Encounter (Signed)
Copied from [place] [phone]. Topic: Quick Communication - Rx Refill/Question >> Jul 27, 2018  8:45 AM Sheran Luz wrote: Medication: traMADol (ULTRAM) 50 MG tablet    Patient is requesting a refill of this medication.   Preferred Pharmacy (with phone number or street name): [id number] [place], [address]

## 2018-07-28 NOTE — Telephone Encounter (Signed)
Called patient after speaking with JoEllen to confirm that the medication was sent to the pharmacy. The patient stated she just received a call from the pharmacy that it was ready for pick up. No further action required.

## 2018-07-28 NOTE — Telephone Encounter (Signed)
Patient states she has been out of this medication for a couple of days now and would like this as soon as possible.

## 2018-07-30 ENCOUNTER — Ambulatory Visit: Payer: BLUE CROSS/BLUE SHIELD | Admitting: Psychology

## 2018-08-10 ENCOUNTER — Ambulatory Visit: Payer: BLUE CROSS/BLUE SHIELD | Admitting: Family Medicine

## 2018-08-10 VITALS — BP 124/74 | HR 81 | Temp 97.7°F | Ht 64.0 in | Wt 207.2 lb

## 2018-08-10 DIAGNOSIS — M2550 Pain in unspecified joint: Secondary | ICD-10-CM

## 2018-08-10 DIAGNOSIS — R5383 Other fatigue: Secondary | ICD-10-CM | POA: Diagnosis not present

## 2018-08-10 DIAGNOSIS — F324 Major depressive disorder, single episode, in partial remission: Secondary | ICD-10-CM

## 2018-08-10 DIAGNOSIS — E559 Vitamin D deficiency, unspecified: Secondary | ICD-10-CM

## 2018-08-10 DIAGNOSIS — R05 Cough: Secondary | ICD-10-CM | POA: Diagnosis not present

## 2018-08-10 DIAGNOSIS — F5081 Binge eating disorder: Secondary | ICD-10-CM | POA: Diagnosis not present

## 2018-08-10 DIAGNOSIS — R059 Cough, unspecified: Secondary | ICD-10-CM

## 2018-08-10 DIAGNOSIS — E669 Obesity, unspecified: Secondary | ICD-10-CM

## 2018-08-10 LAB — C-REACTIVE PROTEIN: CRP: <1 mg/dL (ref 0.5–20.0)

## 2018-08-10 LAB — HEMOGLOBIN A1C: Hgb A1c MFr Bld: 5.6 % (ref 4.6–6.5)

## 2018-08-10 LAB — VITAMIN B12: Vitamin B-12: 423 pg/mL (ref 211–911)

## 2018-08-10 LAB — VITAMIN D 25 HYDROXY (VIT D DEFICIENCY, FRACTURES): VITD: 16.14 ng/mL — ABNORMAL LOW (ref 30.00–100.00)

## 2018-08-10 LAB — SEDIMENTATION RATE: Sed Rate: 34 mm/hr — ABNORMAL HIGH (ref 0–20)

## 2018-08-10 MED ORDER — LISDEXAMFETAMINE DIMESYLATE 30 MG PO CAPS: 30.0000 mg | ORAL_CAPSULE | Freq: Every day | ORAL | 0 refills | Status: DC

## 2018-08-10 MED ORDER — IPRATROPIUM-ALBUTEROL 0.5-2.5 (3) MG/3ML IN SOLN
3.0000 mL | Freq: Four times a day (QID) | RESPIRATORY_TRACT | Status: DC
  Administered 2018-08-10: 3 mL via RESPIRATORY_TRACT

## 2018-08-10 MED ORDER — PREDNISONE 10 MG PO TABS: 10.0000 mg | ORAL_TABLET | Freq: Every day | ORAL | 1 refills | Status: DC

## 2018-08-10 MED ORDER — ALBUTEROL SULFATE HFA 108 (90 BASE) MCG/ACT IN AERS: INHALATION_SPRAY | RESPIRATORY_TRACT | 0 refills | Status: DC

## 2018-08-10 NOTE — Progress Notes (Signed)
[name] is a 43 y.o. female is here for follow up.  History of Present Illness:   [name], CMA acting as scribe for Dr. Briscoe Deutscher.   HPI   Depression:  Current symptoms include depressed mood and fatigue. Symptoms have been changing from day to day. She was not able to keep appointment with Lattie Haw last month due to insurance. Patient denies current suicidal and homicidal ideation. Previous treatment includes: individual therapy and medication. Side effects from the treatment: none. Alcohol use: none.  Drug BBC:WUGQ.       Binge Eating:  Patient is taking Vyvanse 30mg . She feels like it is working well and would like refills today.   Wants recheck for Lupus today. Periodic monitoring. Had mouth ulcers and possible malar rash when see acutely recently.   Recent URI. Improving.   Health Maintenance Due  Topic Date Due  . HIV Screening  11/18/1988   Depression screen [facility] 2/9 08/10/2018 08/10/2018 04/05/2018  Decreased Interest 3 3 2   Down, Depressed, Hopeless 2 2 1   PHQ - 2 Score 5 5 3   Altered sleeping 3 3 3   Tired, decreased energy 3 3 2   Change in appetite 3 3 1   Feeling bad or failure about yourself  1 1 1   Trouble concentrating 3 3 1   Moving slowly or fidgety/restless 3 3 2   Suicidal thoughts 1 1 1   PHQ-9 Score 22 22 14   Difficult doing work/chores Extremely dIfficult Extremely dIfficult Extremely dIfficult  Some recent data might be hidden   PMHx, SurgHx, SocialHx, FamHx, Medications, and Allergies were reviewed in the Visit Navigator and updated as appropriate.   Patient Active Problem List   Diagnosis Date Noted  . Binge eating disorder 05/08/2018  . Chronic migraine w/o aura w/o status migrainosus, not intractable 05/08/2018  . Inflammatory arthritis 01/27/2018  . Rheumatoid arthritis ([place]) 01/27/2018  . OSA (obstructive sleep apnea), could not tolerate CPAP 08/15/2017  . Hip flexor tightness, left 08/04/2017  . Neck pain 08/04/2017  . Vitamin D  deficiency 02/19/2017  . Fatigue 01/28/2017  . Depression, major, single episode, in partial remission ([place]) 01/28/2017  . Obesity (BMI 30-39.9) 01/28/2017  . Primary insomnia 01/28/2017  . Gastroesophageal reflux disease without esophagitis 01/28/2017  . Malignant neoplasm of upper-outer quadrant of left breast in female, estrogen receptor positive ([place]) 07/29/2016  . Genetic testing 01/07/2016  . Breast cancer of upper-outer quadrant of left female breast ([place]) 12/11/2015  . Endometriosis 05/12/2014  . History of hysterectomy for benign disease 05/12/2014   Social History   Tobacco Use  . Smoking status: Never Smoker  . Smokeless tobacco: Never Used  Substance Use Topics  . Alcohol use: Yes    Alcohol/week: 0.0 standard drinks    Comment: socially   . Drug use: No   Current Medications and Allergies   .  folic acid (FOLVITE) 1 MG tablet, Take 1 mg by mouth daily., Disp: , Rfl: 3 .  gabapentin (NEURONTIN) 100 MG capsule, Take 3 capsules by mouth daily., Disp: , Rfl: 0 .  hydrochlorothiazide (MICROZIDE) 12.5 MG capsule, TAKE 1 CAPSULE BY MOUTH EVERY DAY, Disp: 30 capsule, Rfl: 2 .  leflunomide (ARAVA) 10 MG tablet, , Disp: , Rfl: 0 .  LORazepam (ATIVAN) 0.5 MG tablet, Take by mouth., Disp: , Rfl:  .  meloxicam (MOBIC) 15 MG tablet, TAKE 1 TABLET BY MOUTH EVERY DAY, Disp: 30 tablet, Rfl: 0 .  methocarbamol (ROBAXIN) 500 MG tablet, Take by mouth., Disp: ,  Rfl:  .  methotrexate (RHEUMATREX) 2.5 MG tablet, Take 2.5 mg by mouth once a week. Caution:Chemotherapy. Protect from light. Take 6 tab weekly, Disp: , Rfl:  .  omeprazole (PRILOSEC) 40 MG capsule, Take 1 tablet before breakfast and before dinner., Disp: 180 capsule, Rfl: 3 .  predniSONE (DELTASONE) 5 MG tablet, Take 5 mg by mouth daily with breakfast., Disp: , Rfl:  .  propranolol ER (INDERAL LA) 80 MG 24 hr capsule, Take 1 capsule (80 mg total) by mouth daily., Disp: 30 capsule, Rfl: 5 .  RESTASIS MULTIDOSE 0.05 % ophthalmic  emulsion, Place 1 drop into both eyes 2 (two) times daily., Disp: , Rfl: 0 .  spironolactone (ALDACTONE) 25 MG tablet, TAKE 1 TABLET BY MOUTH EVERY DAY, Disp: 90 tablet, Rfl: 1 .  SUMAtriptan (IMITREX) 100 MG tablet, Take 1 tablet earliest onset of headache.  May repeat x1 in 2 hours if headache persists or recurs.  Do not exceed 2 tablets in 24 hours., Disp: 10 tablet, Rfl: 2 .  topiramate (TOPAMAX) 25 MG tablet, Take 50 mg by mouth 2 (two) times daily. , Disp: , Rfl:  .  traMADol (ULTRAM) 50 MG tablet, Take 1 tablet (50 mg total) by mouth every 8 (eight) hours as needed., Disp: 90 tablet, Rfl: 0 .  venlafaxine XR (EFFEXOR-XR) 75 MG 24 hr capsule, TAKE 1 CAPSULE (75 MG TOTAL) BY MOUTH DAILY WITH BREAKFAST., Disp: 90 capsule, Rfl: 0 .  zolpidem (AMBIEN) 10 MG tablet, Take 1/2 to 1 tab po qhs prn, Disp: 30 tablet, Rfl: 5 .  budesonide-formoterol (SYMBICORT) 160-4.5 MCG/ACT inhaler, Inhale 2 puffs into the lungs 2 (two) times daily., Disp: 1 Inhaler, Rfl: 3 .  ipratropium (ATROVENT) 0.06 % nasal spray, PLACE 2 SPRAYS INTO BOTH NOSTRILS 4 (FOUR) TIMES DAILY., Disp: 15 mL, Rfl: 0 .  lisdexamfetamine (VYVANSE) 30 MG capsule, Take 1 capsule (30 mg total) by mouth daily before breakfast for 30 days., Disp: 30 capsule, Rfl: 0    Allergies  Allergen Reactions  . Amoxicillin Shortness Of Breath and Itching    Has patient had a PCN reaction causing immediate rash, facial/tongue/throat swelling, SOB or lightheadedness with hypotension: Yes Has patient had a PCN reaction causing severe rash involving mucus membranes or skin necrosis: No Has patient had a PCN reaction that required hospitalization Yes Has patient had a PCN reaction occurring within the last 10 years: No If all of the above answers are "NO", then may proceed with Cephalosporin use.   . Diflucan [Fluconazole] Nausea Only    heartburn   Review of Systems   Pertinent items are noted in the HPI. Otherwise, a complete ROS is  negative.  Vitals   Vitals:   08/10/18 1308  BP: 124/74  Pulse: 81  Temp: 97.7 F (36.5 C)  TempSrc: Oral  SpO2: 98%  Weight: 207 lb 3.2 oz (94 kg)  Height: 5\' 4"  (1.626 m)     Body mass index is 35.57 kg/m.  Physical Exam   Physical Exam Vitals signs and nursing note reviewed.  Constitutional:      General: She is not in acute distress.    Appearance: Normal appearance. She is normal weight.  HENT:     Head: Normocephalic and atraumatic.     Right Ear: Tympanic membrane normal.     Left Ear: Tympanic membrane normal.     Nose: Nose normal.     Mouth/Throat:     Mouth: Mucous membranes are moist.  Eyes:  Conjunctiva/sclera: Conjunctivae normal.     Pupils: Pupils are equal, round, and reactive to light.  Neck:     Musculoskeletal: Normal range of motion and neck supple.  Cardiovascular:     Rate and Rhythm: Normal rate and regular rhythm.  Pulmonary:     Effort: Pulmonary effort is normal.     Breath sounds: Wheezing present.  Abdominal:     Palpations: Abdomen is soft.  Skin:    General: Skin is warm.     Capillary Refill: Capillary refill takes less than 2 seconds.  Neurological:     General: No focal deficit present.     Mental Status: She is alert.  Psychiatric:        Mood and Affect: Mood normal.        Behavior: Behavior normal.     Assessment and Plan   Adaleigh was seen today for follow-up.  Diagnoses and all orders for this visit:  Depression, major, single episode, in partial remission ([place])  Binge eating disorder -     lisdexamfetamine (VYVANSE) 30 MG capsule; Take 1 capsule (30 mg total) by mouth daily before breakfast for 30 days. -     lisdexamfetamine (VYVANSE) 30 MG capsule; Take 1 capsule (30 mg total) by mouth daily before breakfast for 30 days. -     lisdexamfetamine (VYVANSE) 30 MG capsule; Take 1 capsule (30 mg total) by mouth daily before breakfast for 30 days. -     Hemoglobin A1c  Obesity (BMI  30-39.9)  Polyarthralgia -     Sedimentation rate -     C-reactive protein -     ANA -     predniSONE (DELTASONE) 10 MG tablet; Take 1 tablet (10 mg total) by mouth daily with breakfast.  Cough -     ipratropium-albuterol (DUONEB) 0.5-2.5 (3) MG/3ML nebulizer solution 3 mL  Vitamin D deficiency -     VITAMIN D 25 Hydroxy (Vit-D Deficiency, Fractures)  Fatigue, unspecified type -     B12  . Orders and follow up as documented in [place], reviewed diet, exercise and weight control, cardiovascular risk and specific lipid/LDL goals reviewed, reviewed medications and side effects in detail.  . Reviewed expectations re: course of current medical issues. . Outlined signs and symptoms indicating need for more acute intervention. . Patient verbalized understanding and all questions were answered. . Patient received an After Visit Summary.  CMA served as Education administrator during this visit. History, Physical, and Plan performed by medical provider. The above documentation has been reviewed and is accurate and complete. Briscoe Deutscher, D.O.  Briscoe Deutscher, DO [place], [place] 08/14/2018

## 2018-08-12 ENCOUNTER — Other Ambulatory Visit: Payer: Self-pay

## 2018-08-12 ENCOUNTER — Telehealth: Payer: Self-pay | Admitting: Family Medicine

## 2018-08-12 LAB — ANA: Anti Nuclear Antibody(ANA): NEGATIVE

## 2018-08-12 MED ORDER — BUDESONIDE-FORMOTEROL FUMARATE 160-4.5 MCG/ACT IN AERO: 2.0000 | INHALATION_SPRAY | Freq: Two times a day (BID) | RESPIRATORY_TRACT | 3 refills | Status: DC

## 2018-08-12 MED ORDER — LISDEXAMFETAMINE DIMESYLATE 30 MG PO CAPS: 30.0000 mg | ORAL_CAPSULE | Freq: Every day | ORAL | 0 refills | Status: DC

## 2018-08-12 NOTE — Telephone Encounter (Signed)
See note

## 2018-08-12 NOTE — Telephone Encounter (Signed)
Copied from [place] [phone]. Topic: Quick Communication - Rx Refill/Question >> Aug 12, 2018  8:38 AM Margot Ables wrote: Medication: pt advised that pharmacy did not receive RXs for vyvanse - med profile says "print" - pt stated no RXs were given to her.  Pt states she was given coupon card for symbicort but it was not sent in either. Pt said that they did have RX for albuterol inhaler.  Please call to advise pt on inhalers and sending in vyvanse RX. Has the patient contacted their pharmacy? yes Preferred Pharmacy (with phone number or street name): [id number] [place], [phone] (Phone) [phone] (Fax)

## 2018-08-12 NOTE — Telephone Encounter (Signed)
I have called in right inhaler. I have called patient to let her know no provider in office will send and get someone to send in as soon as we can. Can one of you resend the vyvanse for her?

## 2018-08-12 NOTE — Telephone Encounter (Signed)
Vyvanse sent to pharmacy.

## 2018-08-13 ENCOUNTER — Other Ambulatory Visit: Payer: Self-pay | Admitting: Family Medicine

## 2018-08-13 NOTE — Telephone Encounter (Signed)
My chart message sent with information

## 2018-08-14 ENCOUNTER — Encounter: Payer: Self-pay | Admitting: Family Medicine

## 2018-09-03 ENCOUNTER — Ambulatory Visit: Payer: BLUE CROSS/BLUE SHIELD | Admitting: Psychology

## 2018-09-03 ENCOUNTER — Other Ambulatory Visit: Payer: Self-pay | Admitting: Family Medicine

## 2018-09-03 DIAGNOSIS — F321 Major depressive disorder, single episode, moderate: Secondary | ICD-10-CM

## 2018-09-03 MED ORDER — LORAZEPAM 1 MG PO TABS: 1.0000 mg | ORAL_TABLET | Freq: Every day | ORAL | 0 refills | Status: DC

## 2018-09-03 NOTE — Telephone Encounter (Signed)
pls see message and advise 

## 2018-09-03 NOTE — Addendum Note (Signed)
Addended by: Francella Solian on: 09/03/2018 04:30 PM   Modules accepted: Orders

## 2018-09-03 NOTE — Telephone Encounter (Signed)
Called patient she is not getting but about 3 hours a night. She would like to try the ativan reviewed directions. She is feeling safe in her surroundings and no thought of suicide.

## 2018-09-03 NOTE — Telephone Encounter (Signed)
Pt would like something else prescribed for sleep. Pt stated her Lorrin Mais is not working. Pt made appt for 09/08/2018 but would like new med asap

## 2018-09-03 NOTE — Telephone Encounter (Signed)
Patient said if something is called in differently than please use  [id number] [address], [place] [address]  Phone: [phone] Fax: [phone]    and if not still [facility] [address] and just call in the Ambien.

## 2018-09-03 NOTE — Telephone Encounter (Signed)
Call patient to check in. Options include Ativan. See what dose of prednisone she is taking. Pain an issue?

## 2018-09-03 NOTE — Telephone Encounter (Signed)
See note

## 2018-09-03 NOTE — Telephone Encounter (Signed)
Forwarding to Dr. Wallace to advise.  

## 2018-09-07 LAB — VITAMIN B12: Vitamin B-12: 495

## 2018-09-07 LAB — TSH: TSH: 4.18 (ref 0.41–5.90)

## 2018-09-07 IMAGING — CT CT HEAD W/O CM
4 series · 16 of 47 positions shown, 18 images · non-contrast
Comparison: None available currently.

CLINICAL DATA: Headache for 3 months, papilledema.

EXAM:
CT HEAD WITHOUT CONTRAST
TECHNIQUE: Contiguous axial images were obtained from the base of the skull
through the vertex without intravenous contrast.

[Series 3: head without · axial · non-contrast · 0.42mm/px · z∈[-106,+9]mm · 7 of 31 slices shown, 9 images]
[im 4/31  brain]
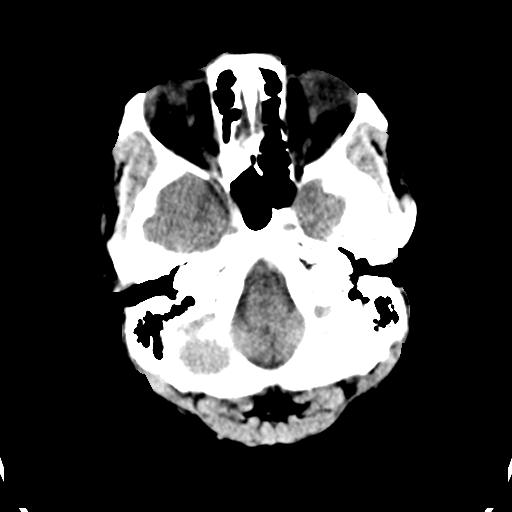
[im 4/31  bone]
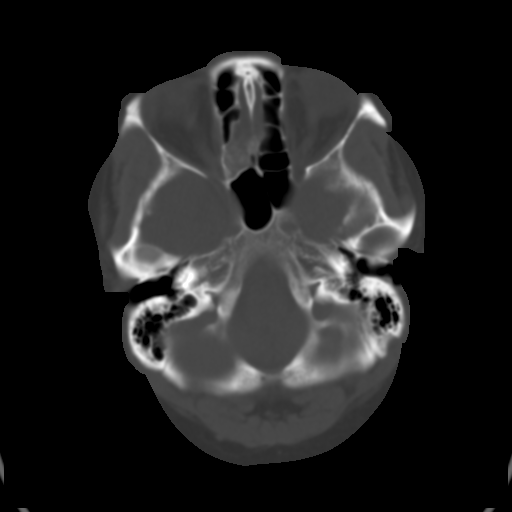
[im 8/31  brain]
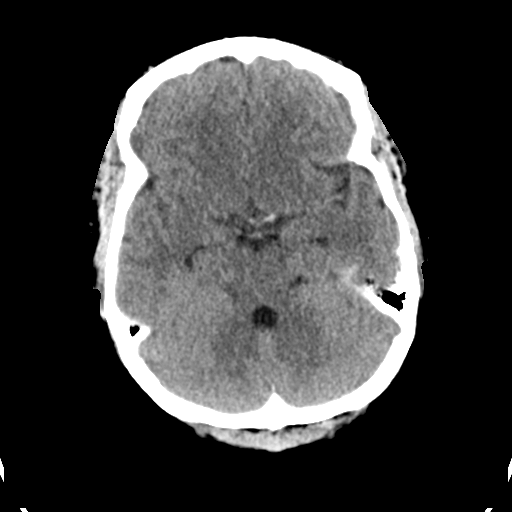
[im 12/31  brain]
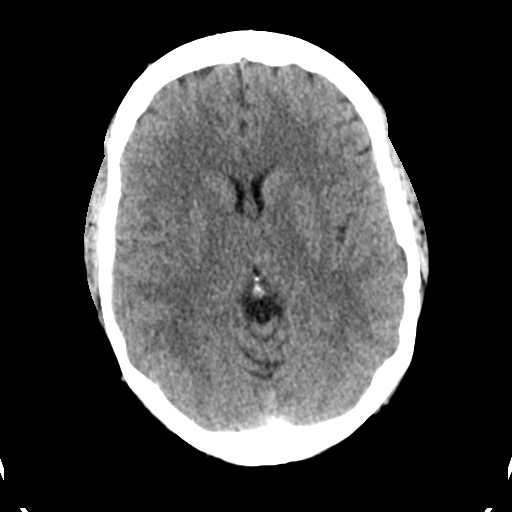
[im 16/31  brain]
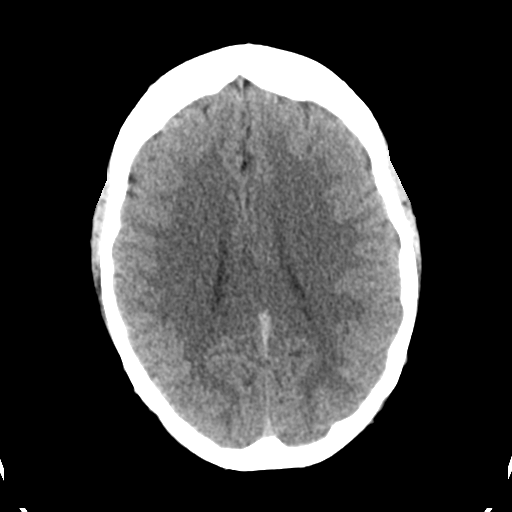
[im 19/31  brain]
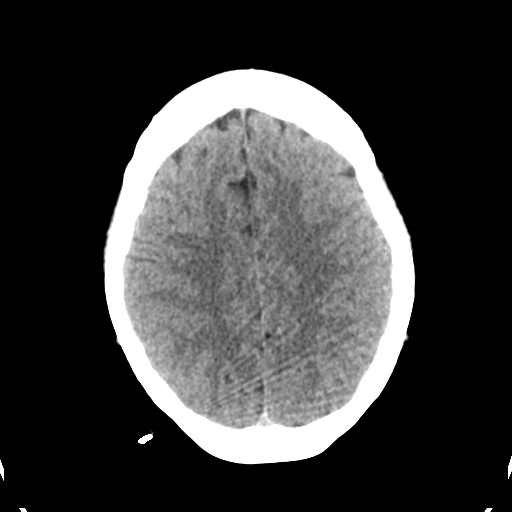
[im 19/31  bone]
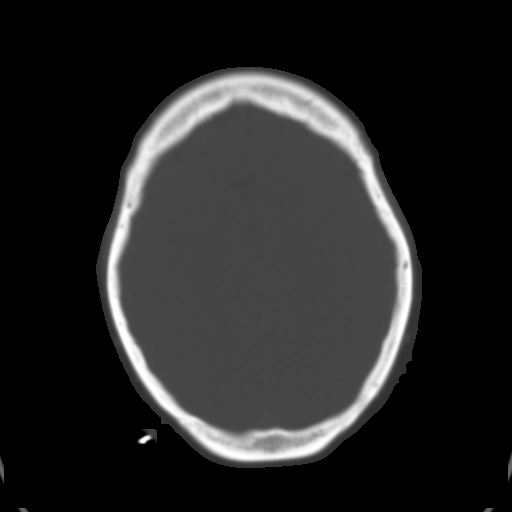
[im 23/31  brain]
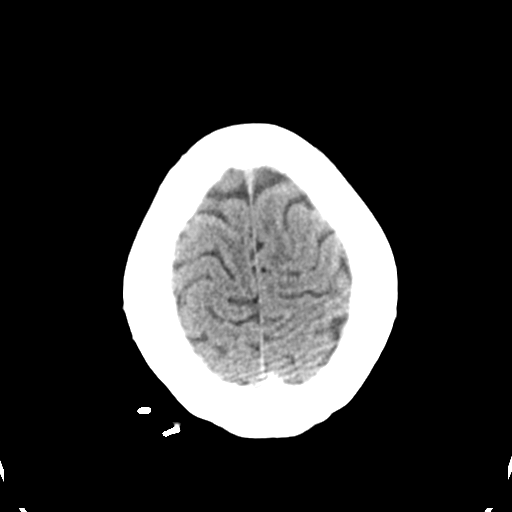
[im 27/31  brain]
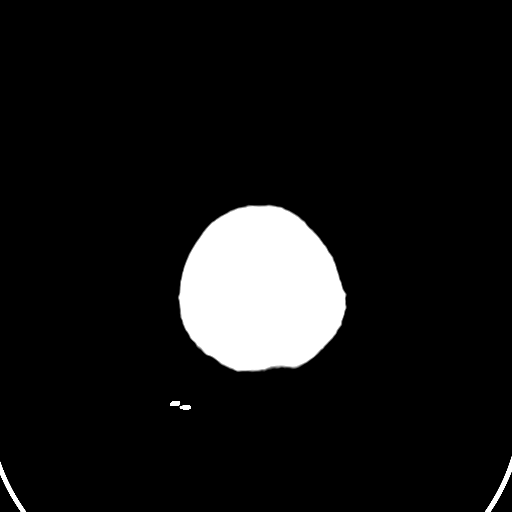

[Series 4: head bone · axial · 0.42mm/px · z∈[-107,-77]mm · 3 of 76 slices shown]
[im 8/76  bone]
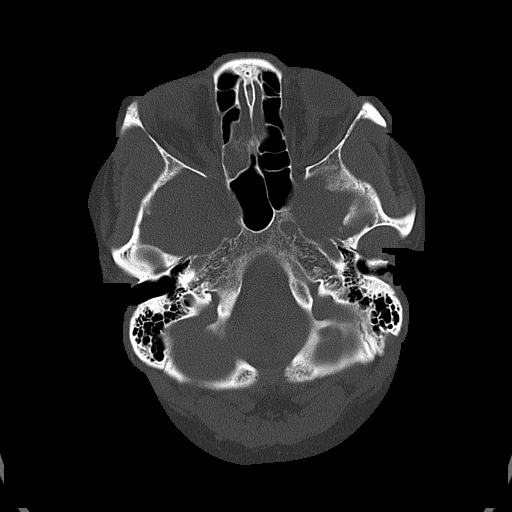
[im 16/76  bone]
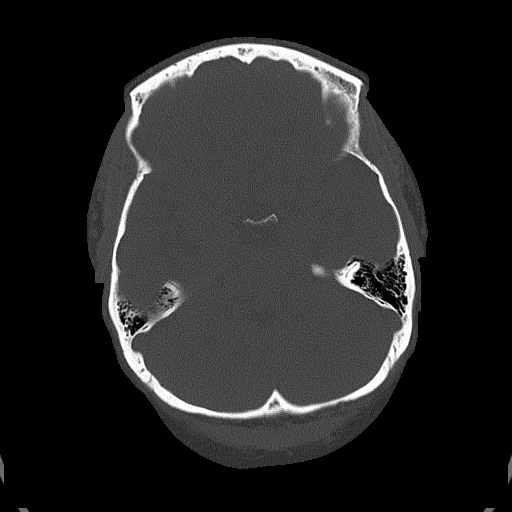
[im 23/76  bone]
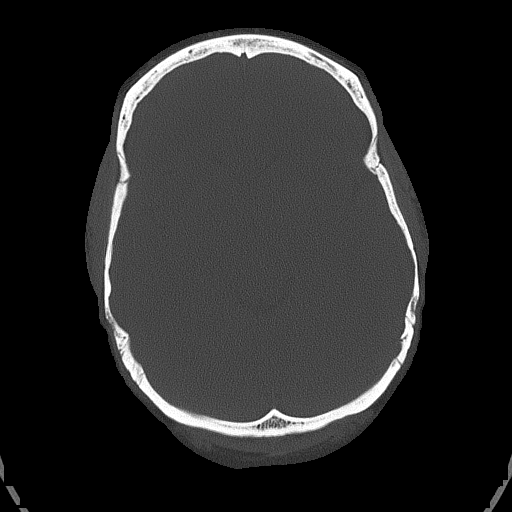

[Series 5: head without cor · coronal · non-contrast · 0.31mm/px · 3 of 67 slices shown]
[im 23/67  brain]
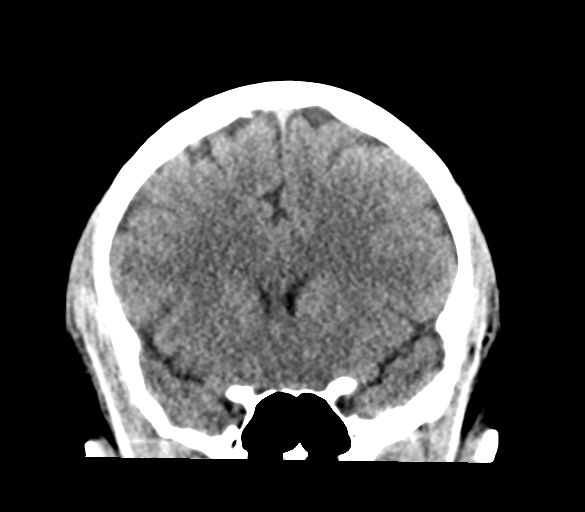
[im 30/67  brain]
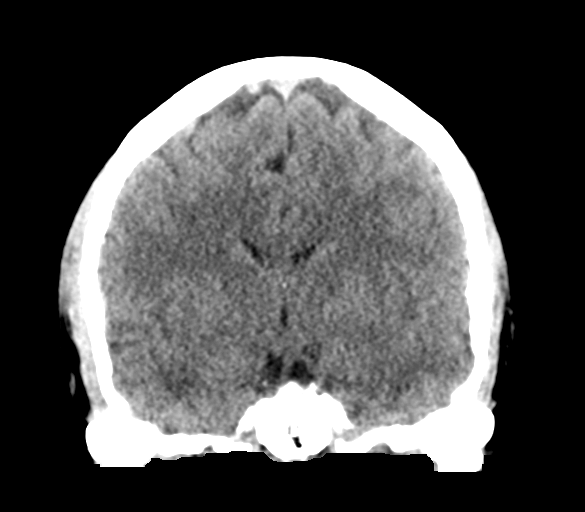
[im 37/67  brain]
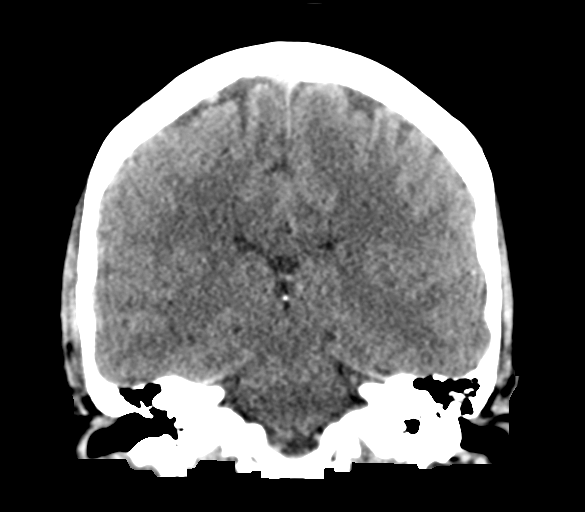

[Series 6: head without sag · sagittal · non-contrast · 0.30mm/px · 3 of 61 slices shown]
[im 21/61  brain]
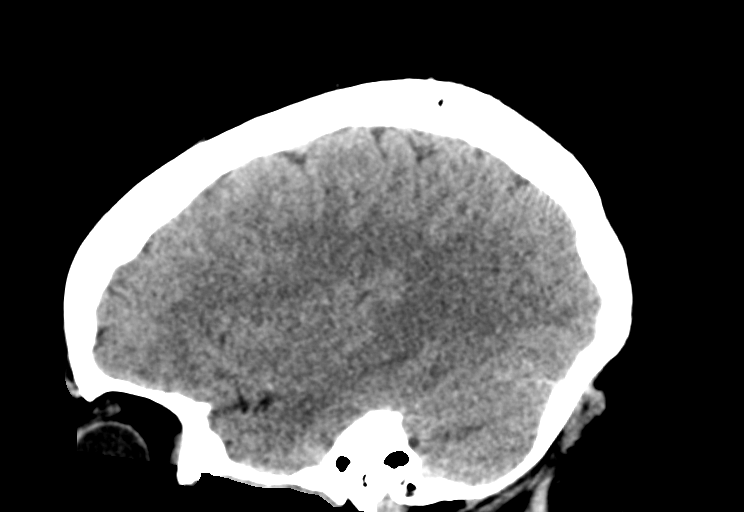
[im 31/61  brain]
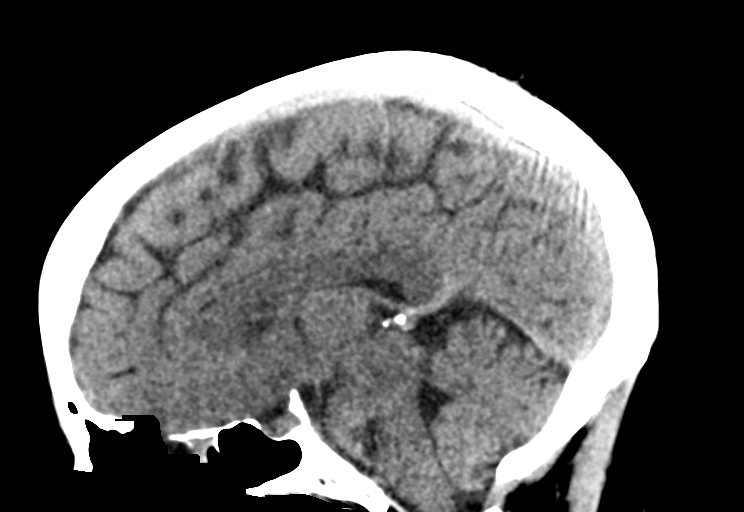
[im 41/61  brain]
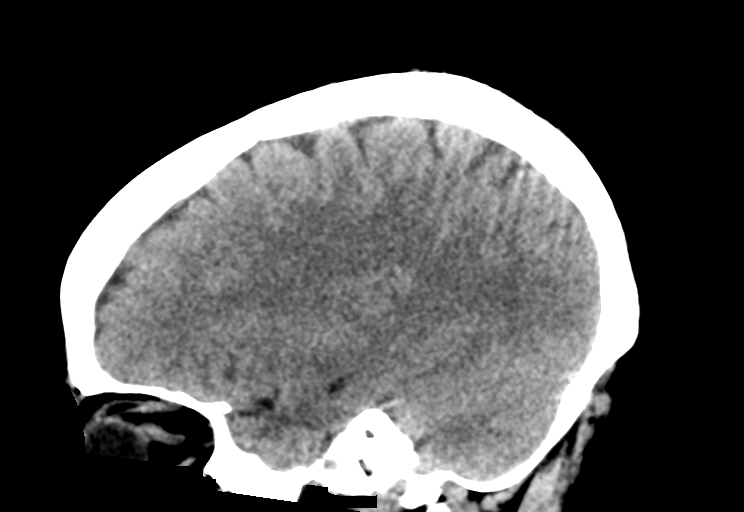

[16 of 47 positions shown; findings below may reference images not displayed]

FINDINGS: Brain: No evidence of acute infarction, hemorrhage, hydrocephalus,
extra-axial collection or mass lesion/mass effect.

Vascular: No hyperdense vessel or unexpected calcification.

Skull: Normal. Negative for fracture or focal lesion.

Sinuses/Orbits: Mild right ethmoid sinusitis is noted.

Other: None.
IMPRESSION: Normal head CT.

## 2018-09-07 IMAGING — RF DG FLUORO GUIDE LUMBAR PUNCTURE
1 series · 1 of 1 positions shown · non-contrast
Comparison: none

CLINICAL DATA: Headache papilledema.  History breast cancer

[Series 1: cp_standard · 0.19mm/px · 1 of 1 slices shown]
[im 1/1]
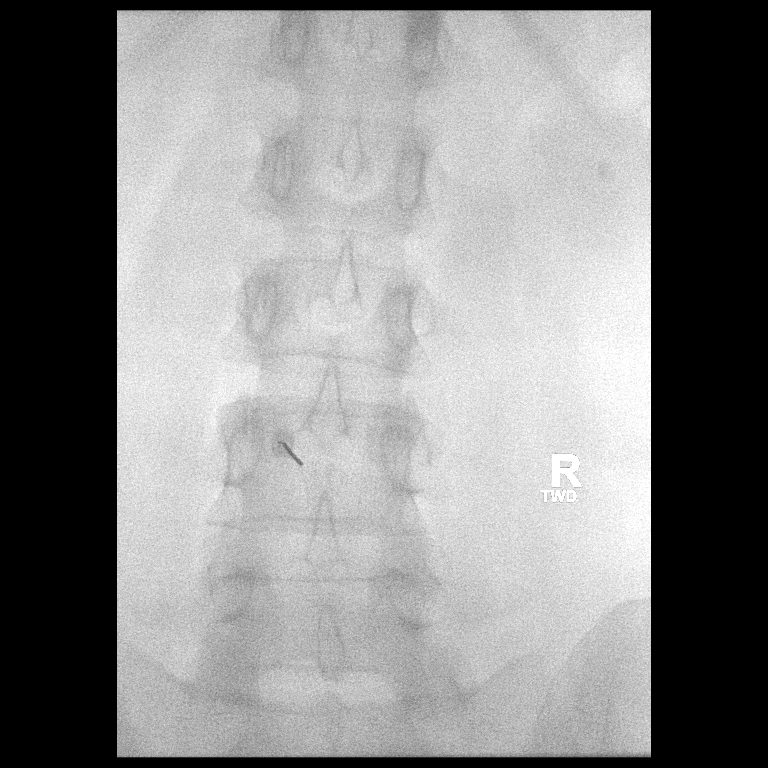

[1 of 1 positions shown; findings below may reference images not displayed]

EXAM:
DIAGNOSTIC LUMBAR PUNCTURE UNDER FLUOROSCOPIC GUIDANCE

FLUOROSCOPY TIME:  Fluoroscopy Time:  0 minutes 30 second

Radiation Exposure Index (if provided by the fluoroscopic device):

Number of Acquired Spot Images: 0

PROCEDURE:
Informed consent was obtained from the patient prior to the
procedure, including potential complications of headache, allergy,
and pain. With the patient prone, the lower back was prepped with
Betadine. 1% Lidocaine was used for local anesthesia. Lumbar
puncture was performed at the L3-4 level using a 22 gauge needle
with return of clear CSF with an opening pressure of 13 cm water.
Nineteen ml of CSF were obtained for laboratory studies. Closing
pressure 9 cm water. The patient tolerated the procedure well and
there were no apparent complications.
IMPRESSION: Successful lumbar puncture using fluoroscopy.

## 2018-09-08 ENCOUNTER — Encounter: Payer: Self-pay | Admitting: Family Medicine

## 2018-09-08 ENCOUNTER — Ambulatory Visit: Payer: BLUE CROSS/BLUE SHIELD | Admitting: Family Medicine

## 2018-09-08 DIAGNOSIS — F5101 Primary insomnia: Secondary | ICD-10-CM | POA: Diagnosis not present

## 2018-09-08 DIAGNOSIS — F5081 Binge eating disorder: Secondary | ICD-10-CM

## 2018-09-08 DIAGNOSIS — M069 Rheumatoid arthritis, unspecified: Secondary | ICD-10-CM

## 2018-09-08 MED ORDER — OXYCODONE-ACETAMINOPHEN 5-325 MG PO TABS: 1.0000 | ORAL_TABLET | Freq: Two times a day (BID) | ORAL | 0 refills | Status: DC | PRN

## 2018-09-08 MED ORDER — PREGABALIN 50 MG PO CAPS: 50.0000 mg | ORAL_CAPSULE | Freq: Three times a day (TID) | ORAL | 0 refills | Status: DC

## 2018-09-08 MED ORDER — LISDEXAMFETAMINE DIMESYLATE 20 MG PO CAPS: 20.0000 mg | ORAL_CAPSULE | Freq: Every day | ORAL | 0 refills | Status: DC

## 2018-09-08 NOTE — Progress Notes (Addendum)
[name] is a 43 y.o. female is here for follow up.  History of Present Illness:   HPI: See Assessment and Plan section for Problem Based Charting of issues discussed today.   Health Maintenance Due  Topic Date Due  . HIV Screening  11/18/1988   Depression screen [facility] 2/9 08/10/2018 08/10/2018 04/05/2018  Decreased Interest 3 3 2   Down, Depressed, Hopeless 2 2 1   PHQ - 2 Score 5 5 3   Altered sleeping 3 3 3   Tired, decreased energy 3 3 2   Change in appetite 3 3 1   Feeling bad or failure about yourself  1 1 1   Trouble concentrating 3 3 1   Moving slowly or fidgety/restless 3 3 2   Suicidal thoughts 1 1 1   PHQ-9 Score 22 22 14   Difficult doing work/chores Extremely dIfficult Extremely dIfficult Extremely dIfficult  Some recent data might be hidden   PMHx, SurgHx, SocialHx, FamHx, Medications, and Allergies were reviewed in the Visit Navigator and updated as appropriate.   Patient Active Problem List   Diagnosis Date Noted  . Binge eating disorder 05/08/2018  . Chronic migraine w/o aura w/o status migrainosus, not intractable 05/08/2018  . Inflammatory arthritis 01/27/2018  . Rheumatoid arthritis ([place]) 01/27/2018  . OSA (obstructive sleep apnea), could not tolerate CPAP 08/15/2017  . Hip flexor tightness, left 08/04/2017  . Neck pain 08/04/2017  . Vitamin D deficiency 02/19/2017  . Fatigue 01/28/2017  . Depression, major, single episode, in partial remission ([place]) 01/28/2017  . Obesity (BMI 30-39.9) 01/28/2017  . Primary insomnia 01/28/2017  . Gastroesophageal reflux disease without esophagitis 01/28/2017  . Malignant neoplasm of upper-outer quadrant of left breast in female, estrogen receptor positive ([place]) 07/29/2016  . Genetic testing 01/07/2016  . Breast cancer of upper-outer quadrant of left female breast ([place]) 12/11/2015  . Endometriosis 05/12/2014  . History of hysterectomy for benign disease 05/12/2014   Social History   Tobacco Use  . Smoking status: Never  Smoker  . Smokeless tobacco: Never Used  Substance Use Topics  . Alcohol use: Yes    Alcohol/week: 0.0 standard drinks    Comment: socially   . Drug use: No   Current Medications and Allergies   .  budesonide-formoterol (SYMBICORT) 160-4.5 MCG/ACT inhaler, Inhale 2 puffs into the lungs 2 (two) times daily., Disp: 1 Inhaler, Rfl: 3 .  doxycycline (VIBRA-TABS) 100 MG tablet, Take 1 tablet (100 mg total) by mouth 2 (two) times daily., Disp: 14 tablet, Rfl: 0 .  folic acid (FOLVITE) 1 MG tablet, Take 1 mg by mouth daily., Disp: , Rfl: 3 .  gabapentin (NEURONTIN) 100 MG capsule, Take 3 capsules by mouth daily., Disp: , Rfl: 0 .  hydrochlorothiazide (MICROZIDE) 12.5 MG capsule, TAKE 1 CAPSULE BY MOUTH EVERY DAY, Disp: 30 capsule, Rfl: 2 .  ipratropium (ATROVENT) 0.06 % nasal spray, PLACE 2 SPRAYS INTO BOTH NOSTRILS 4 (FOUR) TIMES DAILY., Disp: 15 mL, Rfl: 0 .  leflunomide (ARAVA) 10 MG tablet, , Disp: , Rfl: 0 .  lisdexamfetamine (VYVANSE) 30 MG capsule, Take 1 capsule (30 mg total) by mouth daily before breakfast for 30 days., Disp: 30 capsule, Rfl: 0 .  [START ON 09/09/2018] lisdexamfetamine (VYVANSE) 30 MG capsule, Take 1 capsule (30 mg total) by mouth daily before breakfast for 30 days., Disp: 30 capsule, Rfl: 0 .  [START ON 10/09/2018] lisdexamfetamine (VYVANSE) 30 MG capsule, Take 1 capsule (30 mg total) by mouth daily before breakfast for 30 days., Disp: 30 capsule, Rfl: 0 .  lisdexamfetamine (VYVANSE) 30 MG capsule, Take 1 capsule (30 mg total) by mouth daily before breakfast for 30 days., Disp: 30 capsule, Rfl: 0 .  [START ON 09/11/2018] lisdexamfetamine (VYVANSE) 30 MG capsule, Take 1 capsule (30 mg total) by mouth daily before breakfast for 30 days., Disp: 30 capsule, Rfl: 0 .  [START ON 10/11/2018] lisdexamfetamine (VYVANSE) 30 MG capsule, Take 1 capsule (30 mg total) by mouth daily before breakfast for 30 days., Disp: 30 capsule, Rfl: 0 .  LORazepam (ATIVAN) 0.5 MG tablet, Take by mouth., Disp:  , Rfl:  .  LORazepam (ATIVAN) 1 MG tablet, Take 1 tablet (1 mg total) by mouth at bedtime., Disp: 30 tablet, Rfl: 0 .  meloxicam (MOBIC) 15 MG tablet, TAKE 1 TABLET BY MOUTH EVERY DAY, Disp: 30 tablet, Rfl: 0 .  methocarbamol (ROBAXIN) 500 MG tablet, Take by mouth., Disp: , Rfl:  .  methotrexate (RHEUMATREX) 2.5 MG tablet, Take 2.5 mg by mouth once a week. Caution:Chemotherapy. Protect from light. Take 6 tab weekly, Disp: , Rfl:  .  omeprazole (PRILOSEC) 40 MG capsule, Take 1 tablet before breakfast and before dinner., Disp: 180 capsule, Rfl: 3 .  predniSONE (DELTASONE) 10 MG tablet, Take 1 tablet (10 mg total) by mouth daily with breakfast., Disp: 30 tablet, Rfl: 1 .  predniSONE (DELTASONE) 5 MG tablet, Take 5 mg by mouth daily with breakfast., Disp: , Rfl:  .  propranolol ER (INDERAL LA) 80 MG 24 hr capsule, Take 1 capsule (80 mg total) by mouth daily., Disp: 30 capsule, Rfl: 5 .  RESTASIS MULTIDOSE 0.05 % ophthalmic emulsion, Place 1 drop into both eyes 2 (two) times daily., Disp: , Rfl: 0 .  spironolactone (ALDACTONE) 25 MG tablet, TAKE 1 TABLET BY MOUTH EVERY DAY, Disp: 90 tablet, Rfl: 1 .  SUMAtriptan (IMITREX) 100 MG tablet, Take 1 tablet earliest onset of headache.  May repeat x1 in 2 hours if headache persists or recurs.  Do not exceed 2 tablets in 24 hours., Disp: 10 tablet, Rfl: 2 .  topiramate (TOPAMAX) 25 MG tablet, Take 50 mg by mouth 2 (two) times daily. , Disp: , Rfl:  .  traMADol (ULTRAM) 50 MG tablet, Take 1 tablet (50 mg total) by mouth every 8 (eight) hours as needed., Disp: 90 tablet, Rfl: 0 .  venlafaxine XR (EFFEXOR-XR) 75 MG 24 hr capsule, TAKE 1 CAPSULE (75 MG TOTAL) BY MOUTH DAILY WITH BREAKFAST., Disp: 90 capsule, Rfl: 0 .  zolpidem (AMBIEN) 10 MG tablet, Take 1/2 to 1 tab po qhs prn, Disp: 30 tablet, Rfl: 5   Allergies  Allergen Reactions  . Amoxicillin Shortness Of Breath and Itching    Has patient had a PCN reaction causing immediate rash, facial/tongue/throat  swelling, SOB or lightheadedness with hypotension: Yes Has patient had a PCN reaction causing severe rash involving mucus membranes or skin necrosis: No Has patient had a PCN reaction that required hospitalization Yes Has patient had a PCN reaction occurring within the last 10 years: No If all of the above answers are "NO", then may proceed with Cephalosporin use.   . Diflucan [Fluconazole] Nausea Only    heartburn   Review of Systems   Pertinent items are noted in the HPI. Otherwise, a complete ROS is negative.  Vitals   Vitals:   09/08/18 1553  BP: 118/82  Pulse: 95  Temp: 97.9 F (36.6 C)  TempSrc: Oral  SpO2: 99%  Weight: 204 lb 9.6 oz (92.8 kg)  Height: 5\' 4"  (1.626 m)  Body mass index is 35.12 kg/m.  Physical Exam   Physical Exam Vitals signs and nursing note reviewed.  Constitutional:      General: She is not in acute distress.    Appearance: Normal appearance. She is normal weight.  HENT:     Head: Normocephalic and atraumatic.     Right Ear: Tympanic membrane normal.     Left Ear: Tympanic membrane normal.     Nose: Nose normal.     Mouth/Throat:     Mouth: Mucous membranes are moist.  Eyes:     Conjunctiva/sclera: Conjunctivae normal.     Pupils: Pupils are equal, round, and reactive to light.  Neck:     Musculoskeletal: Normal range of motion and neck supple.  Cardiovascular:     Rate and Rhythm: Normal rate and regular rhythm.  Pulmonary:     Effort: Pulmonary effort is normal.     Breath sounds: Wheezing present.  Abdominal:     Palpations: Abdomen is soft.  Skin:    General: Skin is warm.     Capillary Refill: Capillary refill takes less than 2 seconds.  Neurological:     General: No focal deficit present.     Mental Status: She is alert.  Psychiatric:        Mood and Affect: Mood normal.        Behavior: Behavior normal.     Results for orders placed or performed in visit on 08/10/18  Hemoglobin A1c  Result Value Ref Range   Hgb  A1c MFr Bld 5.6 4.6 - 6.5 %  Sedimentation rate  Result Value Ref Range   Sed Rate 34 (H) 0 - 20 mm/hr  C-reactive protein  Result Value Ref Range   CRP <1.0 0.5 - 20.0 mg/dL  VITAMIN D 25 Hydroxy (Vit-D Deficiency, Fractures)  Result Value Ref Range   VITD 16.14 (L) 30.00 - 100.00 ng/mL  B12  Result Value Ref Range   Vitamin B-12 423 211 - 911 pg/mL  ANA  Result Value Ref Range   Anti Nuclear Antibody(ANA) NEGATIVE NEGATIVE    Assessment and Plan   Binge eating disorder Much improved with Vyvanse. Refill today. No concerns. Database reviewed.   Rheumatoid arthritis (HCC) Chronic pain continues. Baseline 6/10. See orders for new regimen. Note: We discussed the fact that she will be on a regimen of medications that include several controlled substances. She has been tried on many other medications and is working on all other modalities to control pain. She maximized re: psychiatric medications and is seeing a Social worker. Database reviewed. Risks discussed at length.   Primary insomnia Difficulty with sleep still. Pain and psychiatric etiology of insomnia. See new orders.   Meds ordered this encounter  Medications  . oxyCODONE-acetaminophen (PERCOCET/ROXICET) 5-325 MG tablet    Sig: Take 1 tablet by mouth 2 (two) times daily as needed for severe pain.    Dispense:  60 tablet    Refill:  0  . pregabalin (LYRICA) 50 MG capsule    Sig: Take 1 capsule (50 mg total) by mouth 3 (three) times daily.    Dispense:  90 capsule    Refill:  0  . lisdexamfetamine (VYVANSE) 20 MG capsule    Sig: Take 1 capsule (20 mg total) by mouth daily.    Dispense:  30 capsule    Refill:  0   . Reviewed expectations re: course of current medical issues. . Discussed self-management of symptoms. . Outlined signs and symptoms indicating  need for more acute intervention. . Patient verbalized understanding and all questions were answered. Marland Kitchen Health Maintenance issues including appropriate healthy diet,  exercise, and smoking avoidance were discussed with patient. . See orders for this visit as documented in the electronic medical record. . Patient received an After Visit Summary.  Briscoe Deutscher, DO [place], [facility] 09/23/2018

## 2018-09-13 ENCOUNTER — Telehealth: Payer: Self-pay

## 2018-09-13 NOTE — Telephone Encounter (Signed)
Torie Secrist Key: [id number] - Rx #: [id number]  Need help? Call us at [phone]    Outcome  Approvedtoday Effective from 09/13/2018 through 10/12/2018.   Drug oxyCODONE-Acetaminophen 5-325MG  tablets Form Blue Building control surveyor Form (CB)   Original Claim Info 70,75 RESUBMIT FOR 7 DAYS OR PRIORAUTHORIZATION  My chart sent to patient to let her know approved.

## 2018-09-15 ENCOUNTER — Other Ambulatory Visit: Payer: Self-pay | Admitting: *Deleted

## 2018-09-15 DIAGNOSIS — R202 Paresthesia of skin: Secondary | ICD-10-CM

## 2018-09-15 DIAGNOSIS — R200 Anesthesia of skin: Secondary | ICD-10-CM

## 2018-09-19 NOTE — Assessment & Plan Note (Signed)
Much improved with Vyvanse. Refill today. No concerns. Database reviewed.
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
## 2018-09-21 ENCOUNTER — Ambulatory Visit: Payer: Self-pay | Admitting: Neurology

## 2018-09-22 ENCOUNTER — Other Ambulatory Visit: Payer: Self-pay | Admitting: Physician Assistant

## 2018-09-23 NOTE — Assessment & Plan Note (Signed)
Difficulty with sleep still. Pain and psychiatric etiology of insomnia. See new orders.

## 2018-09-23 NOTE — Assessment & Plan Note (Signed)
Chronic pain continues. Baseline 6/10. See orders for new regimen. Note: We discussed the fact that she will be on a regimen of medications that include several controlled substances. She has been tried on many other medications and is working on all other modalities to control pain. She maximized re: psychiatric medications and is seeing a Social worker. Database reviewed. Risks discussed at length.

## 2018-09-25 ENCOUNTER — Other Ambulatory Visit: Payer: Self-pay | Admitting: Family Medicine

## 2018-09-25 DIAGNOSIS — F331 Major depressive disorder, recurrent, moderate: Secondary | ICD-10-CM

## 2018-09-28 ENCOUNTER — Encounter: Payer: Self-pay | Admitting: Neurology

## 2018-10-05 ENCOUNTER — Telehealth: Payer: Self-pay | Admitting: Family Medicine

## 2018-10-05 ENCOUNTER — Other Ambulatory Visit: Payer: Self-pay | Admitting: Family Medicine

## 2018-10-05 DIAGNOSIS — F331 Major depressive disorder, recurrent, moderate: Secondary | ICD-10-CM

## 2018-10-05 MED ORDER — VENLAFAXINE HCL ER 75 MG PO CP24: 75.0000 mg | ORAL_CAPSULE | Freq: Every day | ORAL | 0 refills | Status: DC

## 2018-10-05 NOTE — Telephone Encounter (Signed)
See note

## 2018-10-05 NOTE — Telephone Encounter (Signed)
Pt called and left vm stating she needs her effexor sent to a different pharmacy.   [facility] [place] [place]

## 2018-10-06 NOTE — Telephone Encounter (Signed)
Called patient l/m and sent my chart.  [facility] she can call the one she wants changed to and have them transfer for her. If not let us know.

## 2018-10-17 ENCOUNTER — Other Ambulatory Visit: Payer: Self-pay | Admitting: Family Medicine

## 2018-10-17 DIAGNOSIS — M2550 Pain in unspecified joint: Secondary | ICD-10-CM

## 2018-10-21 ENCOUNTER — Ambulatory Visit: Payer: Self-pay

## 2018-10-21 ENCOUNTER — Telehealth: Payer: Self-pay | Admitting: Family Medicine

## 2018-10-21 DIAGNOSIS — E876 Hypokalemia: Secondary | ICD-10-CM

## 2018-10-21 DIAGNOSIS — I10 Essential (primary) hypertension: Secondary | ICD-10-CM

## 2018-10-21 MED ORDER — SPIRONOLACTONE 25 MG PO TABS: 25.0000 mg | ORAL_TABLET | Freq: Every day | ORAL | 0 refills | Status: DC

## 2018-10-21 NOTE — Telephone Encounter (Signed)
See note

## 2018-10-21 NOTE — Telephone Encounter (Signed)
Left 3rd message on pt's voice mail. Routing to office.  Pt stated she is in [place] until May and she would like to know how not taking lisdexamfetamine (VYVANSE) 20 MG capsule for the next few weeks will effect her. Pt stated she has been taking it consistently as she is suppose to but she knows she should not suddenly just stop taking it. Pt stated she is concerned and would like to speak with the clinical staff. Pt requests call back. Cb# [phone]

## 2018-10-21 NOTE — Telephone Encounter (Signed)
Last OV 09/08/2018 Last refill 07/12/18 #90/1 Next OV 11/09/18

## 2018-10-21 NOTE — Telephone Encounter (Signed)
Copied from [place] [id number]. Topic: Quick Communication - Rx Refill/Question >> Oct 21, 2018  1:27 PM Mcneil, Ja-Kwan wrote: Medication: spironolactone (ALDACTONE) 25 MG tablet  Has the patient contacted their pharmacy? no  Preferred Pharmacy (with phone number or street name): [id number] - [place], [address] [phone] (Phone)  [phone] (Fax)  Agent: Please be advised that RX refills may take up to 3 business days. We ask that you follow-up with your pharmacy.

## 2018-10-22 NOTE — Telephone Encounter (Signed)
We can do virtual visit. State line issues with visit and Rx not an issue during pandemic.

## 2018-10-22 NOTE — Telephone Encounter (Signed)
Called pt and left vm to schedule doxy.

## 2018-10-25 NOTE — Telephone Encounter (Signed)
Patient will be seen tomorrow.

## 2018-10-26 ENCOUNTER — Ambulatory Visit: Payer: BLUE CROSS/BLUE SHIELD | Admitting: Family Medicine

## 2018-11-09 ENCOUNTER — Ambulatory Visit (INDEPENDENT_AMBULATORY_CARE_PROVIDER_SITE_OTHER): Payer: BLUE CROSS/BLUE SHIELD | Admitting: Family Medicine

## 2018-11-09 ENCOUNTER — Encounter: Payer: Self-pay | Admitting: Family Medicine

## 2018-11-09 ENCOUNTER — Other Ambulatory Visit: Payer: Self-pay

## 2018-11-09 ENCOUNTER — Ambulatory Visit (INDEPENDENT_AMBULATORY_CARE_PROVIDER_SITE_OTHER): Payer: BLUE CROSS/BLUE SHIELD | Admitting: Psychology

## 2018-11-09 VITALS — Ht 64.0 in | Wt 202.0 lb

## 2018-11-09 DIAGNOSIS — M797 Fibromyalgia: Secondary | ICD-10-CM

## 2018-11-09 DIAGNOSIS — L209 Atopic dermatitis, unspecified: Secondary | ICD-10-CM

## 2018-11-09 DIAGNOSIS — G43709 Chronic migraine without aura, not intractable, without status migrainosus: Secondary | ICD-10-CM | POA: Diagnosis not present

## 2018-11-09 DIAGNOSIS — G4733 Obstructive sleep apnea (adult) (pediatric): Secondary | ICD-10-CM

## 2018-11-09 DIAGNOSIS — M069 Rheumatoid arthritis, unspecified: Secondary | ICD-10-CM

## 2018-11-09 DIAGNOSIS — F324 Major depressive disorder, single episode, in partial remission: Secondary | ICD-10-CM

## 2018-11-09 DIAGNOSIS — F5081 Binge eating disorder: Secondary | ICD-10-CM

## 2018-11-09 DIAGNOSIS — F321 Major depressive disorder, single episode, moderate: Secondary | ICD-10-CM | POA: Diagnosis not present

## 2018-11-09 DIAGNOSIS — R5383 Other fatigue: Secondary | ICD-10-CM

## 2018-11-09 DIAGNOSIS — F331 Major depressive disorder, recurrent, moderate: Secondary | ICD-10-CM

## 2018-11-09 MED ORDER — OXYCODONE-ACETAMINOPHEN 5-325 MG PO TABS: 1.0000 | ORAL_TABLET | Freq: Two times a day (BID) | ORAL | 0 refills | Status: DC | PRN

## 2018-11-09 MED ORDER — METHOCARBAMOL 500 MG PO TABS: 500.0000 mg | ORAL_TABLET | Freq: Every day | ORAL | 0 refills | Status: DC

## 2018-11-09 MED ORDER — LISDEXAMFETAMINE DIMESYLATE 30 MG PO CAPS: 30.0000 mg | ORAL_CAPSULE | Freq: Every day | ORAL | 0 refills | Status: DC

## 2018-11-09 MED ORDER — TRIAMCINOLONE ACETONIDE 0.025 % EX OINT: 1.0000 "application " | TOPICAL_OINTMENT | Freq: Two times a day (BID) | CUTANEOUS | 0 refills | Status: DC

## 2018-11-09 MED ORDER — VENLAFAXINE HCL ER 150 MG PO CP24: 150.0000 mg | ORAL_CAPSULE | Freq: Every day | ORAL | 0 refills | Status: DC

## 2018-11-09 NOTE — Patient Instructions (Signed)
Crisis number: [phone] [phone]

## 2018-11-09 NOTE — Progress Notes (Signed)
Virtual Visit via Video   Due to the COVID-19 pandemic, this visit was completed with telemedicine (audio/video) technology to reduce patient and provider exposure as well as to preserve personal protective equipment.   I connected with [name] by a video enabled telemedicine application and verified that I am speaking with the correct person using two identifiers. Location patient: Home Location provider: [facility], Office Persons participating in the virtual visit: PAM VANALSTINE, Briscoe Deutscher, DO   I discussed the limitations of evaluation and management by telemedicine and the availability of in person appointments. The patient expressed understanding and agreed to proceed.  Care Team   Patient Care Team: Briscoe Deutscher, DO as PCP - General (Family Medicine) Magrinat, Virgie Dad, MD as Consulting Physician (Oncology) [name], MD as Consulting Physician (General Surgery) [name], MD as Consulting Physician (Obstetrics and Gynecology) Delice Bison, Charlestine Massed, NP as Nurse Practitioner (Hematology and Oncology) Kyung Rudd, MD as Consulting Physician (Radiation Oncology) Lahoma Rocker, MD as Referring Physician (Rheumatology) Shawnie Dapper, DO (Osteopathic Medicine) Pieter Partridge, DO as Consulting Physician (Neurology)  Subjective:   HPI: Patient has been in [place] during quarantine to be with her husband. She will be flying back to [facility] on May 18th. Plans to move permanently around September.   She had to stop the Symbicort due to cost.   She has follow up with Lattie Haw today. She has had increased depression due to the the fatigue associated with the medications. The tools that Lattie Haw has given her have helped a lot.   Lyrica caused nausea so stopped. She is currently on Gabapentin 100 mg tid. Vyvanse still helpful. Ativan is taken sparingly. Robaxin taken only q hs. Percocet taken once daily usually, unless during a flare.   Rash: Patient has  rash that has come up on wrist and around ankle. It is dry and itchy.   Review of Systems  Constitutional: Negative for chills and fever.  HENT: Negative for hearing loss and tinnitus.   Eyes: Negative for blurred vision and double vision.  Respiratory: Negative for cough and hemoptysis.   Cardiovascular: Negative for chest pain and palpitations.  Gastrointestinal: Negative for heartburn and nausea.  Genitourinary: Negative for dysuria and urgency.  Musculoskeletal: Negative for myalgias and neck pain.  Skin: Negative for rash.  Neurological: Negative for dizziness and headaches.  Endo/Heme/Allergies: Does not bruise/bleed easily.  Psychiatric/Behavioral: Positive for suicidal ideas. Negative for depression.       Patient provided with crisis number. She does not have active plan and was able to reach out to a friend who gave her support through it.     Patient Active Problem List   Diagnosis Date Noted  . Binge eating disorder 05/08/2018  . Chronic migraine w/o aura w/o status migrainosus, not intractable 05/08/2018  . Inflammatory arthritis 01/27/2018  . Rheumatoid arthritis ([place]) 01/27/2018  . OSA (obstructive sleep apnea), could not tolerate CPAP 08/15/2017  . Hip flexor tightness, left 08/04/2017  . Neck pain 08/04/2017  . Vitamin D deficiency 02/19/2017  . Fatigue 01/28/2017  . Depression, major, single episode, in partial remission ([place]) 01/28/2017  . Obesity (BMI 30-39.9) 01/28/2017  . Primary insomnia 01/28/2017  . Gastroesophageal reflux disease without esophagitis 01/28/2017  . Malignant neoplasm of upper-outer quadrant of left breast in female, estrogen receptor positive ([place]) 07/29/2016  . Genetic testing 01/07/2016  . Breast cancer of upper-outer quadrant of left female breast ([place]) 12/11/2015  . Endometriosis 05/12/2014  . History of  hysterectomy for benign disease 05/12/2014    Social History   Tobacco Use  . Smoking status: Never Smoker  . Smokeless tobacco:  Never Used  Substance Use Topics  . Alcohol use: Yes    Alcohol/week: 0.0 standard drinks    Comment: socially    Current Outpatient Medications:  .  folic acid (FOLVITE) 1 MG tablet, Take 1 mg by mouth daily., Disp: , Rfl: 3 .  ipratropium (ATROVENT) 0.06 % nasal spray, PLACE 2 SPRAYS INTO BOTH NOSTRILS 4 (FOUR) TIMES DAILY., Disp: 15 mL, Rfl: 0 .  lisdexamfetamine (VYVANSE) 20 MG capsule, Take 1 capsule (20 mg total) by mouth daily., Disp: 30 capsule, Rfl: 0 .  LORazepam (ATIVAN) 1 MG tablet, Take 1 tablet (1 mg total) by mouth at bedtime prn., Disp: 30 tablet, Rfl: 0 .  meloxicam (MOBIC) 15 MG tablet, TAKE 1 TABLET BY MOUTH EVERY DAY, Disp: 30 tablet, Rfl: 0 .  methocarbamol (ROBAXIN) 500 MG tablet, Take 1 tablet (500 mg total) by mouth at bedtime., Disp: 30 tablet, Rfl: 0 .  methotrexate (RHEUMATREX) 2.5 MG tablet, Take 2.5 mg by mouth once a week. Caution:Chemotherapy. Protect from light. Take 6 tab weekly, Disp: , Rfl:  .  omeprazole (PRILOSEC) 40 MG capsule, TAKE 1 TABLET BEFORE BREAKFAST AND BEFORE DINNER., Disp: 180 capsule, Rfl: 0 .  oxyCODONE-acetaminophen (PERCOCET/ROXICET) 5-325 MG tablet, Take 1 tablet by mouth1- 2 (two) times daily as needed for severe pain., Disp: 60 tablet, Rfl: 0 .  predniSONE (DELTASONE) 10 MG tablet, TAKE 1 TABLET (10 MG TOTAL) BY MOUTH DAILY WITH BREAKFAST., Disp: 30 tablet, Rfl: 0 .  propranolol ER (INDERAL LA) 80 MG 24 hr capsule, Take 1 capsule (80 mg total) by mouth daily., Disp: 30 capsule, Rfl: 5 .  RESTASIS MULTIDOSE 0.05 % ophthalmic emulsion, Place 1 drop into both eyes 2 (two) times daily., Disp: , Rfl: 0 .  spironolactone (ALDACTONE) 25 MG tablet, Take 1 tablet (25 mg total) by mouth daily., Disp: 90 tablet, Rfl: 0 .  SUMAtriptan (IMITREX) 100 MG tablet, Take 1 tablet earliest onset of headache.  May repeat x1 in 2 hours if headache persists or recurs.  Do not exceed 2 tablets in 24 hours., Disp: 10 tablet, Rfl: 2 .  venlafaxine XR (EFFEXOR-XR) 75  MG 24 hr capsule, Take 1 capsule (75 mg total) by mouth daily with breakfast., Disp: 90 capsule, Rfl: 0 .  gabapentin (NEURONTIN) 100 MG capsule, , Disp: , Rfl:   Allergies  Allergen Reactions  . Amoxicillin Shortness Of Breath and Itching    Has patient had a PCN reaction causing immediate rash, facial/tongue/throat swelling, SOB or lightheadedness with hypotension: Yes Has patient had a PCN reaction causing severe rash involving mucus membranes or skin necrosis: No Has patient had a PCN reaction that required hospitalization Yes Has patient had a PCN reaction occurring within the last 10 years: No If all of the above answers are "NO", then may proceed with Cephalosporin use.   . Diflucan [Fluconazole] Nausea Only    heartburn   Objective:   VITALS: Per patient if applicable, see vitals. GENERAL: Alert, appears well and in no acute distress. HEENT: Atraumatic, conjunctiva clear, no obvious abnormalities on inspection of external nose and ears. NECK: Normal movements of the head and neck. CARDIOPULMONARY: No increased WOB. Speaking in clear sentences. I:E ratio WNL.  MS: Moves all visible extremities without noticeable abnormality. PSYCH: Pleasant and cooperative, well-groomed. Speech normal rate and rhythm. Affect is appropriate. Insight and judgement  are appropriate. Attention is focused, linear, and appropriate.  NEURO: CN grossly intact. Oriented as arrived to appointment on time with no prompting. Moves both UE equally.  SKIN: Wrist with dark raised dry patch.  Depression screen [facility] 2/9 11/09/2018 08/10/2018 08/10/2018  Decreased Interest 2 3 3   Down, Depressed, Hopeless 3 2 2   PHQ - 2 Score 5 5 5   Altered sleeping 3 3 3   Tired, decreased energy 3 3 3   Change in appetite 3 3 3   Feeling bad or failure about yourself  3 1 1   Trouble concentrating 2 3 3   Moving slowly or fidgety/restless 0 3 3  Suicidal thoughts 1 1 1   PHQ-9 Score 20 22 22   Difficult doing work/chores Extremely  dIfficult Extremely dIfficult Extremely dIfficult  Some recent data might be hidden   Assessment and Plan:   Karrah was seen today for follow-up.  Diagnoses and all orders for this visit:  Rash -     triamcinolone (KENALOG) 0.025 % ointment; Apply 1 application topically 2 (two) times daily.  Chronic migraine w/o aura w/o status migrainosus, not intractable  Depression, major, single episode, in partial remission (HCC) -     venlafaxine XR (EFFEXOR XR) 150 MG 24 hr capsule; Take 1 capsule (150 mg total) by mouth daily with breakfast.  Rheumatoid arthritis, involving unspecified site, unspecified rheumatoid factor presence (HCC) -     venlafaxine XR (EFFEXOR XR) 150 MG 24 hr capsule; Take 1 capsule (150 mg total) by mouth daily with breakfast. -     oxyCODONE-acetaminophen (PERCOCET/ROXICET) 5-325 MG tablet; Take 1 tablet by mouth 2 (two) times daily as needed for severe pain.  OSA (obstructive sleep apnea), could not tolerate CPAP  Moderate episode of recurrent major depressive disorder (HCC)  Morbid obesity (HCC)  Other fatigue  Binge eating disorder -     lisdexamfetamine (VYVANSE) 30 MG capsule; Take 1 capsule (30 mg total) by mouth daily before breakfast for 30 days.  Fibromyalgia -     methocarbamol (ROBAXIN) 500 MG tablet; Take 1 tablet (500 mg total) by mouth at bedtime. -     venlafaxine XR (EFFEXOR XR) 150 MG 24 hr capsule; Take 1 capsule (150 mg total) by mouth daily with breakfast.   . COVID-19 Education: The signs and symptoms of COVID-19 were discussed with the patient and how to seek care for testing if needed. The importance of social distancing was discussed today. . Reviewed expectations re: course of current medical issues. . Discussed self-management of symptoms. . Outlined signs and symptoms indicating need for more acute intervention. . Patient verbalized understanding and all questions were answered. Marland Kitchen Health Maintenance issues including appropriate  healthy diet, exercise, and smoking avoidance were discussed with patient. . See orders for this visit as documented in the electronic medical record.  Briscoe Deutscher, DO  Records requested if needed. Time spent: 25 minutes, of which >50% was spent in obtaining information about her symptoms, reviewing her previous labs, evaluations, and treatments, counseling her about her condition (please see the discussed topics above), and developing a plan to further investigate it; she had a number of questions which I addressed.

## 2018-11-23 ENCOUNTER — Ambulatory Visit: Payer: BLUE CROSS/BLUE SHIELD | Admitting: Psychology

## 2018-11-30 ENCOUNTER — Other Ambulatory Visit: Payer: Self-pay

## 2018-11-30 ENCOUNTER — Ambulatory Visit (INDEPENDENT_AMBULATORY_CARE_PROVIDER_SITE_OTHER): Payer: BLUE CROSS/BLUE SHIELD | Admitting: Neurology

## 2018-11-30 DIAGNOSIS — R200 Anesthesia of skin: Secondary | ICD-10-CM | POA: Diagnosis not present

## 2018-11-30 DIAGNOSIS — R202 Paresthesia of skin: Secondary | ICD-10-CM | POA: Diagnosis not present

## 2018-11-30 NOTE — Procedures (Signed)
[facility]  [address], [address]  [place], [place] [address] Tel: [phone] Fax:  [phone] Test Date:  11/30/2018  Patient: [name] DOB: [birth year] Physician: Narda Amber, DO  Sex: Female Height: 5\' 4"  Ref Phys: Lahoma Rocker, MD  ID#: [id number] Temp: 34.6C Technician:    Patient Complaints: This is a 43 year old female with history of breast cancer s/p lumpectomy and chemotherapy referred for evaluation of right hand pain and paresthesias.  NCV & EMG Findings: Extensive electrodiagnostic testing of the right upper extremity shows:  1. Right median, ulnar, and mixed palmar sensory responses are within normal limits. 2. Right median and ulnar motor responses are within normal limits. 3. There is no evidence of active or chronic motor axonal loss changes affecting any of the tested muscles.  Motor unit configuration and recruitment pattern is within normal limits.  Impression: This is a normal study of the right upper extremity.  In particular, there is no evidence of carpal tunnel syndrome or cervical radiculopathy.   ___________________________ Narda Amber, DO    Nerve Conduction Studies Anti Sensory Summary Table   Site NR Peak (ms) Norm Peak (ms) P-T Amp (V) Norm P-T Amp  Right Median Anti Sensory (2nd Digit)  34.6C  Wrist    2.8 <3.4 35.3 >20  Right Ulnar Anti Sensory (5th Digit)  34.6C  Wrist    2.5 <3.1 38.1 >12   Motor Summary Table   Site NR Onset (ms) Norm Onset (ms) O-P Amp (mV) Norm O-P Amp Site1 Site2 Delta-0 (ms) Dist (cm) Vel (m/s) Norm Vel (m/s)  Right Median Motor (Abd Poll Brev)  34.6C  Wrist    2.6 <3.9 16.3 >6 Elbow Wrist 4.4 28.0 64 >50  Elbow    7.0  16.0         Right Ulnar Motor (Abd Dig Minimi)  34.6C  Wrist    1.8 <3.1 10.8 >7 B Elbow Wrist 3.8 24.0 63 >50  B Elbow    5.6  10.8  A Elbow B Elbow 1.6 10.0 62 >50  A Elbow    7.2  10.4          Comparison Summary Table   Site NR Peak (ms) Norm Peak (ms) P-T  Amp (V) Site1 Site2 Delta-P (ms) Norm Delta (ms)  Right Median/Ulnar Palm Comparison (Wrist - 8cm)  34.6C  Median Palm    1.8 <2.2 41.2 Median Palm Ulnar Palm 0.3   Ulnar Palm    1.5 <2.2 17.2       EMG   Side Muscle Ins Act Fibs Psw Fasc Number Recrt Dur Dur. Amp Amp. Poly Poly. Comment  Right 1stDorInt Nml Nml Nml Nml Nml Nml Nml Nml Nml Nml Nml Nml N/A  Right PronatorTeres Nml Nml Nml Nml Nml Nml Nml Nml Nml Nml Nml Nml N/A  Right Biceps Nml Nml Nml Nml Nml Nml Nml Nml Nml Nml Nml Nml N/A  Right Triceps Nml Nml Nml Nml Nml Nml Nml Nml Nml Nml Nml Nml N/A  Right Deltoid Nml Nml Nml Nml Nml Nml Nml Nml Nml Nml Nml Nml N/A      Waveforms:

## 2018-12-01 ENCOUNTER — Ambulatory Visit: Payer: Self-pay | Admitting: Neurology

## 2018-12-13 ENCOUNTER — Ambulatory Visit (INDEPENDENT_AMBULATORY_CARE_PROVIDER_SITE_OTHER): Payer: BLUE CROSS/BLUE SHIELD | Admitting: Psychology

## 2018-12-13 ENCOUNTER — Encounter: Payer: Self-pay | Admitting: Neurology

## 2018-12-13 DIAGNOSIS — F321 Major depressive disorder, single episode, moderate: Secondary | ICD-10-CM

## 2018-12-13 NOTE — Progress Notes (Signed)
Virtual Visit via Telephone Note The purpose of this virtual visit is to provide medical care while limiting exposure to the novel coronavirus.    Consent was obtained for phone visit:  Yes.   Answered questions that patient had about telehealth interaction:  Yes.   I discussed the limitations, risks, security and privacy concerns of performing an evaluation and management service by telephone. I also discussed with the patient that there may be a patient responsible charge related to this service. The patient expressed understanding and agreed to proceed.  Pt location: Home Physician Location: office Name of referring provider:  Briscoe Deutscher, DO I connected with .[name] at patients initiation/request on 12/14/2018 at 10:50 AM EDT by telephone and verified that I am speaking with the correct person using two identifiers.  Pt MRN:  [id number] Pt DOB:  Aug 13, 1975   History of Present Illness:  [name] is a 43 year old right-handed female with reactive depression, chronic inflammatory arthritis, fibromyalgia, generalized anxiety disorder, OSA, and history of breast cancer status post left lumpectomy with chemotherapy and radiation therapy who follows up for migraines.   UPDATE: Intensity:  5-6/10 Duration:  Migraine lasts 30 minutes with sumatriptan Frequency:  1 day a week Frequency of abortive medication: sumatriptan twice in last month, tramadol daily for other pain. Current NSAIDS: None Current analgesics: Tramadol 50 mg (for fibromyalgia and rheumatoid arthritis) Current triptans: Sumatriptan 100 mg Current ergotamine: None Current anti-emetic: None Current muscle relaxants: None Current anti-anxiolytic: None Current sleep aide: Ambien Current Antihypertensive medications:  Propranolol ER 40m Current Antidepressant medications: Cymbalta 20 mg, sertraline 100 mg Current Anticonvulsant medications: None Current anti-CGRP: None Current  Vitamins/Herbal/Supplements: Folic acid Current Antihistamines/Decongestants: None Other therapy: meditation, yoga, tai chi Other medication: Methotrexate   Caffeine: No Hydration: Drinks plenty of water Exercise: 30 minutes walking per day Depression: Yes; Anxiety: Yes Other pain: Generalized arthritic pain Sleep hygiene: Okay with Ambien.  Uses CPAP for OSA.  She also had a NCV-EMG of the right upper extremity on 11/30/18 to evaluate right hand pain and paresthesias, which was normal.  No associated neck pain.  She may drop objections but due to numbness rather than weakness.   HISTORY: Onset:  She has had migraines off and on since high school.  She was being treated for breast cancer and finished therapy in 2018.  She is currently in remission.    Since December 2018, she has had increased frequency of her migraines. Location: temples (bilateral or either side) Quality:  pounding.  It is not a thunderclap headache.  Initial intensity:  8/10 Aura:  no Prodrome:  no Postdrome:  no  associated symptoms: Photophobia phonophobia.  Sometimes nausea.  There is no associated vomiting, visual disturbance or unilateral numbness or weakness. Initial duration:  1 hour to all day   initial Frequency:  Daily (lasts all day about 2 days a week)  triggers: Emotional stress, computer/phone screen, coffee Relieving factors:  Excedrin, quite and dark environment Activity:  aggravates   Past NSAIDs: Ibuprofen ineffective for headache.  For other pain, she has taken ketoprofen, naproxen 5062m diclofenac 7546mablet Past analgesics:  oxycodone, Excedrin Past triptans:  no Past muscle relaxant:  Flexeril, Robaxin Past antiemetics:  Zofran 8mg36mompazine 10mg26mt antihypertensives:  HCTZ Past antidepressants:  venlafaxine XR 75mg,15mlbutrin Past antiepileptics:  acetazolamide 125mg t53m daily Past vitamins/supplements:  no   In December 2018, she was noted to have bilateral disc edema on  ophthalmologic exam.  She underwent workup  for increased intracranial hypertension.  CT of head from 06/11/17 was personally reviewed and was normal.  She then underwent LP with normal opening pressure of 13 cm water.  She reportedly had a negative MRI earlier that year but she does not remember.  She never had a repeat eye exam.  Since treatment for breast cancer, she also has had short term memory problems that have gradually progressed.  She did undergo neuropsychological testing in November 2018.  Testing did demonstrate an unspecified mild neurocognitive disorder presenting with weaknesses in working memory and verbal/auditory memory encoding.  Questionable if it may be secondary to chemotherapy but depression and anxiety may be exacerbating her cognitive deficits.  She was advised to consider referral to [facility] for cogntive rehabilitation, as well as psychiatric management.  MRI of brain and orbits with and without contrast from 01/27/2018 was personally reviewed and was unremarkable.  I had referred her to ophthalmology for re-evaluation of possible papilledema.  No papilledema was noted.    Family history:  Maternal aunt with headaches    Observations/Objective:   Height '5\' 4"'  (1.626 m), weight 208 lb (94.3 kg). No acute distress.  Alert and oriented.  Speech fluent and not dysarthric.  Language intact.   Assessment and Plan:   1.  Migraine without aura, without status migrainosus, not intractable 2.  Right hand pain and paresthesias.  NCV-EMG normal.  Possibly related to fibromyalgia.  May consider wearing wrist splint. 3.  Depression and anxiety/adjustment disorder 4.  Fibromyalgia   For preventative management, propranolol ER 29m daily (refilled)  For abortive therapy, sumatriptan (refilled)  Limit use of pain relievers to no more than 2 days out of week to prevent risk of rebound or medication-overuse headache.  Keep headache diary  Exercise, hydration, caffeine  cessation, sleep hygiene, monitor for and avoid triggers  Consider:  magnesium citrate 4071mdaily, riboflavin 40050maily, and coenzyme Q10 100m76mree times daily Always keep in mind that currently taking a hormone or birth control may be a possible trigger or aggravating factor for migraine. Follow up in 6 months.   Follow Up Instructions:    -I discussed the assessment and treatment plan with the patient. The patient was provided an opportunity to ask questions and all were answered. The patient agreed with the plan and demonstrated an understanding of the instructions.   The patient was advised to call back or seek an in-person evaluation if the symptoms worsen or if the condition fails to improve as anticipated.    Total Time spent in visit with the patient was:  15 minutes  AdamDudley Major

## 2018-12-14 ENCOUNTER — Encounter: Payer: Self-pay | Admitting: Neurology

## 2018-12-14 ENCOUNTER — Telehealth (INDEPENDENT_AMBULATORY_CARE_PROVIDER_SITE_OTHER): Payer: BLUE CROSS/BLUE SHIELD | Admitting: Neurology

## 2018-12-14 ENCOUNTER — Other Ambulatory Visit: Payer: Self-pay

## 2018-12-14 VITALS — Ht 64.0 in | Wt 208.0 lb

## 2018-12-14 DIAGNOSIS — G43009 Migraine without aura, not intractable, without status migrainosus: Secondary | ICD-10-CM | POA: Diagnosis not present

## 2018-12-14 DIAGNOSIS — R200 Anesthesia of skin: Secondary | ICD-10-CM | POA: Diagnosis not present

## 2018-12-14 DIAGNOSIS — R202 Paresthesia of skin: Secondary | ICD-10-CM

## 2018-12-14 MED ORDER — SUMATRIPTAN SUCCINATE 100 MG PO TABS: ORAL_TABLET | ORAL | 5 refills | Status: DC

## 2018-12-14 MED ORDER — PROPRANOLOL HCL ER 80 MG PO CP24: 80.0000 mg | ORAL_CAPSULE | Freq: Every day | ORAL | 5 refills | Status: DC

## 2018-12-20 ENCOUNTER — Telehealth: Payer: Self-pay | Admitting: Physical Therapy

## 2018-12-20 ENCOUNTER — Encounter: Payer: Self-pay | Admitting: Physician Assistant

## 2018-12-20 NOTE — Telephone Encounter (Signed)
Copied from [place] [id number]. Topic: Medical Record Request - Attorney/Litigation >> Dec 20, 2018  2:49 PM Alanda Slim E wrote: Patient Name/DOB/MRN #: [name]/1975-11-12/MRN# [id number] Requestor Name/Agency: Juliann Pulse / [employer]  Call Back #: [phone] Record request was faxed to office on 5.28.20/ please fax back to [phone] Information Requested: records during Pt's care at [facility] to [place] for [facility]. For all other clinics, route to the clinic's PEC Pool.

## 2018-12-21 NOTE — Telephone Encounter (Signed)
Have you seen this ?

## 2018-12-22 NOTE — Telephone Encounter (Signed)
Please see if there is a request for medical records that was sent over or check with HIM.

## 2018-12-22 NOTE — Telephone Encounter (Signed)
No clue..If so it was sent to HIM

## 2018-12-27 NOTE — Telephone Encounter (Signed)
Called pt and lvm stating records were sent over 12/24/2018

## 2018-12-27 NOTE — Telephone Encounter (Signed)
Hailey, I saw you took responsibility on closing this message out this morning. Please update note once possible.

## 2019-01-05 ENCOUNTER — Other Ambulatory Visit: Payer: Self-pay

## 2019-01-05 ENCOUNTER — Telehealth: Payer: Self-pay | Admitting: Family Medicine

## 2019-01-05 ENCOUNTER — Ambulatory Visit (INDEPENDENT_AMBULATORY_CARE_PROVIDER_SITE_OTHER): Payer: BLUE CROSS/BLUE SHIELD | Admitting: Psychology

## 2019-01-05 DIAGNOSIS — F5081 Binge eating disorder: Secondary | ICD-10-CM

## 2019-01-05 DIAGNOSIS — F321 Major depressive disorder, single episode, moderate: Secondary | ICD-10-CM | POA: Diagnosis not present

## 2019-01-05 MED ORDER — LISDEXAMFETAMINE DIMESYLATE 30 MG PO CAPS: 30.0000 mg | ORAL_CAPSULE | Freq: Every day | ORAL | 0 refills | Status: DC

## 2019-01-05 NOTE — Telephone Encounter (Signed)
Rx refill Last fill 11/09/18  #30/0 Last OV 11/09/18

## 2019-01-05 NOTE — Telephone Encounter (Signed)
Patient living in [place] now, I think. I'm okay with refill. Just need to confirm pharmacy, then tee up for me.

## 2019-01-05 NOTE — Telephone Encounter (Signed)
Ok.  I spoke with patient and she is currently in [place].  Rx teed up for Dr. Juleen China.

## 2019-01-05 NOTE — Telephone Encounter (Signed)
Medication Refill - Medication: lisdexamfetamine (VYVANSE) 30 MG capsule, zolpidem (AMBIEN) 10 MG tablet     Has the patient contacted their pharmacy? Yes.   (Agent: If no, request that the patient contact the pharmacy for the refill.) (Agent: If yes, when and what did the pharmacy advise?)  Preferred Pharmacy (with phone number or street name):  [id number] [address], [place]. [phone] (Phone) [phone] (Fax)     Agent: Please be advised that RX refills may take up to 3 business days. We ask that you follow-up with your pharmacy.

## 2019-01-06 ENCOUNTER — Telehealth: Payer: Self-pay

## 2019-01-06 NOTE — Telephone Encounter (Signed)
Copied from [place] [phone]. Topic: General - Other >> Jan 06, 2019  8:37 AM Leward Quan A wrote: Reason for CRM: Patient called to say that she spoke to someone on 01/05/2019 and was told that her Rx for Ambien 10 mg would be called in but per pharmacy it was not sent there. She is also requesting that Dr Juleen China order her the covid antibody test please. Ph# [phone]

## 2019-01-06 NOTE — Telephone Encounter (Signed)
I did not see [place] on her med list to tee up for you yesterday.  Okay to fill? Also is it ok for pt to have the Covid antibody testing performed?

## 2019-01-07 ENCOUNTER — Encounter: Payer: Self-pay | Admitting: Family Medicine

## 2019-01-07 MED ORDER — ZOLPIDEM TARTRATE 10 MG PO TABS: 10.0000 mg | ORAL_TABLET | Freq: Every evening | ORAL | 1 refills | Status: DC | PRN

## 2019-01-07 NOTE — Telephone Encounter (Signed)
Patient is calling on the status of her   zolpidem (AMBIEN) 10 MG tablet [[id number]] DISCONTINUED   Please advise thank you

## 2019-01-07 NOTE — Addendum Note (Signed)
Addended by: Briscoe Deutscher R on: 01/07/2019 02:41 PM   Modules accepted: Orders

## 2019-01-10 NOTE — Telephone Encounter (Signed)
Called patient l/m to call office  

## 2019-01-10 NOTE — Telephone Encounter (Signed)
See note

## 2019-01-13 ENCOUNTER — Encounter: Payer: Self-pay | Admitting: *Deleted

## 2019-01-13 NOTE — Telephone Encounter (Signed)
Left message on voicemail to call office. Making sure pt got Rx for Ambien, was sent to pharmacy on 7/3.

## 2019-01-14 NOTE — Telephone Encounter (Signed)
Pt sent My Chart message did get Rx

## 2019-01-17 ENCOUNTER — Telehealth: Payer: Self-pay | Admitting: Neurology

## 2019-01-17 NOTE — Telephone Encounter (Signed)
Patient sent [place] a msg requesting testing for short term memory loss and confusion. She was wanting testing done with [facility]. Thanks!

## 2019-01-17 NOTE — Telephone Encounter (Signed)
Prior to seeing me, she was sent to Dr. Si Raider for memory deficits.  If she is still concerned about her memory and feels that it has gotten worse, then I would schedule her for neuropsychological testing to evaluate for any changes from her last exam in late 2018.

## 2019-01-18 NOTE — Telephone Encounter (Signed)
Called and advised Pt we will need to schedule neuropsych testing if she feels her memory has worsened. She agreed to this. She knows to expect a call from scheduling.  I will send to the scheduling for Dr. Melvyn Novas.

## 2019-01-18 NOTE — Telephone Encounter (Signed)
I will put her on the list

## 2019-01-21 ENCOUNTER — Other Ambulatory Visit: Payer: Self-pay | Admitting: Family Medicine

## 2019-01-21 DIAGNOSIS — E876 Hypokalemia: Secondary | ICD-10-CM

## 2019-01-21 DIAGNOSIS — I10 Essential (primary) hypertension: Secondary | ICD-10-CM

## 2019-01-24 DIAGNOSIS — M2550 Pain in unspecified joint: Secondary | ICD-10-CM

## 2019-01-24 NOTE — Telephone Encounter (Signed)
MyChart message went to pt -  Hi [name],  We received a refill request for your Spironolactone to be refilled to a pharmacy in [place]. I just wanted to touch base with your first to make sure that this is where the prescription needs to be sent to.   Thanks,   Brandy/CMA

## 2019-01-27 ENCOUNTER — Ambulatory Visit: Payer: Self-pay | Admitting: Psychology

## 2019-01-27 NOTE — Telephone Encounter (Signed)
Last OV 11/09/18 Last refill 10/17/18

## 2019-01-28 ENCOUNTER — Encounter: Payer: Self-pay | Admitting: Family Medicine

## 2019-01-28 ENCOUNTER — Other Ambulatory Visit: Payer: Self-pay

## 2019-01-28 MED ORDER — PREDNISONE 10 MG PO TABS: 10.0000 mg | ORAL_TABLET | Freq: Every day | ORAL | 0 refills | Status: DC

## 2019-01-28 NOTE — Telephone Encounter (Signed)
Called patient she realized soul come from rheumatology but it is Friday PM and no way to reach them let her know that we will not give in future she will need to get from them. She verbalized understanding.

## 2019-01-28 NOTE — Telephone Encounter (Signed)
Patient checking on the status of prednisone request sent in on 01/24/2019. Patient states if she misses a dose she has really bad side effects, please reach out to patient today.   [facility]  [phone]

## 2019-01-28 NOTE — Telephone Encounter (Signed)
See note

## 2019-01-29 MED ORDER — PREDNISONE 10 MG PO TABS: 10.0000 mg | ORAL_TABLET | Freq: Every day | ORAL | 3 refills | Status: DC

## 2019-01-29 NOTE — Addendum Note (Signed)
Addended by: Briscoe Deutscher R on: 01/29/2019 09:11 AM   Modules accepted: Orders

## 2019-02-05 ENCOUNTER — Other Ambulatory Visit: Payer: Self-pay | Admitting: Family Medicine

## 2019-02-05 DIAGNOSIS — M069 Rheumatoid arthritis, unspecified: Secondary | ICD-10-CM

## 2019-02-05 DIAGNOSIS — M797 Fibromyalgia: Secondary | ICD-10-CM

## 2019-02-05 DIAGNOSIS — F324 Major depressive disorder, single episode, in partial remission: Secondary | ICD-10-CM

## 2019-02-14 NOTE — Progress Notes (Signed)
Patient was not seen that day

## 2019-03-03 ENCOUNTER — Telehealth: Payer: Self-pay

## 2019-03-03 NOTE — Telephone Encounter (Signed)
Pt made appt for med refill on 9/4 for  oxyCODONE-acetaminophen (PERCOCET/ROXICET) 5-325 MG tablet. Pt stated she is completely out and in a lot of pain and would like to know if anything else can be taken or prescribed until her appt on 9/4. Please advise.

## 2019-03-03 NOTE — Telephone Encounter (Signed)
Please advise 

## 2019-03-04 NOTE — Telephone Encounter (Signed)
See if patient is due for medication. Tee up if yes.

## 2019-03-07 ENCOUNTER — Other Ambulatory Visit: Payer: Self-pay

## 2019-03-07 NOTE — Telephone Encounter (Signed)
We are not able to check the database she is getting filled in [facility].

## 2019-03-07 NOTE — Telephone Encounter (Signed)
Pulled up not able to tell if she is due by chart or record.

## 2019-03-07 NOTE — Telephone Encounter (Signed)
Call her and see what is going on.

## 2019-03-08 ENCOUNTER — Ambulatory Visit: Payer: Self-pay | Admitting: Psychology

## 2019-03-08 ENCOUNTER — Encounter: Payer: Self-pay | Admitting: Psychology

## 2019-03-08 ENCOUNTER — Other Ambulatory Visit: Payer: Self-pay

## 2019-03-08 ENCOUNTER — Ambulatory Visit (INDEPENDENT_AMBULATORY_CARE_PROVIDER_SITE_OTHER): Payer: Self-pay | Admitting: Psychology

## 2019-03-08 DIAGNOSIS — M797 Fibromyalgia: Secondary | ICD-10-CM

## 2019-03-08 DIAGNOSIS — F331 Major depressive disorder, recurrent, moderate: Secondary | ICD-10-CM

## 2019-03-08 DIAGNOSIS — G4733 Obstructive sleep apnea (adult) (pediatric): Secondary | ICD-10-CM

## 2019-03-08 DIAGNOSIS — F411 Generalized anxiety disorder: Secondary | ICD-10-CM

## 2019-03-08 DIAGNOSIS — F329 Major depressive disorder, single episode, unspecified: Secondary | ICD-10-CM | POA: Insufficient documentation

## 2019-03-08 DIAGNOSIS — F410 Panic disorder [episodic paroxysmal anxiety] without agoraphobia: Secondary | ICD-10-CM

## 2019-03-08 NOTE — Progress Notes (Signed)
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 Average    Short-Delay Cued Recall 7/16 (-2.5) 7/16 (6) 9 Below Average    Long Delay Free Recall 7/16 (-2.5) 5/16 (5) 5 Well Below Average    Long Delay Cued Recall 8/16 (-2.5) 6/16 (5) 5 Well Below Average      Recognition Hits 11/16 (---) 16/16 (13) 84 Above Average      False Positive Errors 12 (---) 11 (5) 5 Well Below Average        Brief Visuospatial Memory Test (BVMT-R), Form 1: T Score Raw Score (T Score) Percentile     Total Trials 1-3 --- 12/36 (25) 1 Exceptionally Low    Delayed Recall --- 1/12 (20) <1 Exceptionally Low    Recognition Discrimination Index --- 3 1-2 Exceptionally Low      Recognition Hits --- 5/6 11-16 Below Average      False Positive Errors --- 2 <1 Exceptionally Low        Attention/Executive Function:            Trail Making Test (TMT): Raw Score Raw Score (T Score) Percentile     Part A 25 secs.,  0 errors 27 secs.,  0 errors (51) 54 Average    Part B 79 secs.,  0 errors 68 secs.,  0 errors (50) 50 Average         Scaled Score Scaled Score Percentile     WAIS-IV Coding: 7 10 50 Average        [name] Attention Module, Form 1: T Score T Score Percentile     Digits Forward --- 38 12 Below Average    Digits Backwards --- 48 42 Average        D-KEFS Color-Word Interference Test: Scaled Score Raw Score (Scaled Score) Percentile     Color Naming --- 46 secs. (2) <1 Exceptionally Low    Word Reading --- 33 secs. (3) 1 Exceptionally Low    Inhibition --- 70 secs. (7) 16 Below Average      Total Errors --- 1 error (10) 50 Average    Inhibition/Switching --- 85 secs. (6) 9 Below Average      Total  Errors --- 5 errors (7) 16 Below Average        D-KEFS Verbal Fluency Test: Scaled Score Raw Score (Scaled Score) Percentile     Letter Total Correct --- 33 (8) 25 Average    Category Total Correct --- 24 (4) 2 Well Below Average    Category Switching Total Correct --- 10 (5) 5 Well Below Average    Category Switching Accuracy --- 9 (7) 16 Below Average      Total Set Loss Errors --- 1 (11) 63 Average      Total Repetition Errors --- 6 (5) 5 Well Below Average        D-KEFS 20 Questions Test: Scaled Score Scaled Score Percentile     Total Weighted Achievement Score --- 8 25 Average    Initial Abstraction Score --- 9 37 Average        [place] Card Sorting Test [facility]): Raw Score Raw Score Percentile     Categories (trials) 4 (64) 3 (64) >16 Within Normal Limits    Total Errors 13 (49 T) 20 18 Below Average    Perseverative Errors 10 (39 T) 11 8 Well Below Average    Non-Perseverative Errors --- 9 18 Below Average    Failure to Maintain Set 0 0 --- ---        Language:            [name] Language Module, Form 1: T Score T Score Percentile     Auditory Comprehension --- 24 <1 Exceptionally Low    Naming 30/31 (52 T) 27/31 (23 T) <1 Exceptionally Low        Visuospatial/Visuoconstruction:            [name] Spatial Module, Form 1: T Score T Score Percentile     Figure Drawing Copy --- 61 86 Above Average    Figure Drawing Immediate Recall --- 42 21  Below Average         Scaled Score Scaled Score Percentile   WAIS-IV Matrix Reasoning: '6 8 25 ' Average  WAIS-IV Visual Puzzles: --- 6 9 Below Average        Mood and Personality:       Raw Score Raw Score Percentile   Beck Depression Inventory - II: 26 28 --- Moderate  PROMIS Anxiety Questionnaire: --- 21 --- Moderate   Informed Consent and Coding/Compliance:   Ms. Quarry was provided with a verbal description of the nature and purpose of the present neuropsychological evaluation. Also reviewed were the foreseeable risks and/or discomforts and benefits of the procedure, limits of confidentiality, and mandatory reporting requirements of this provider. The patient was given the opportunity to ask questions and receive answers about the evaluation. Oral consent to participate was provided by the patient.   This evaluation was conducted by Christia Reading, Ph.D., licensed clinical neuropsychologist. Ms. Cardella completed a 30-minute clinical interview, billed as one unit [phone], and 145 minutes of cognitive testing, billed as one unit [phone] and four additional units [phone]. Psychometrist Milana Kidney, B.S. assisted Dr. Melvyn Novas with test administration and scoring procedures. As a separate and discrete service, Dr. Melvyn Novas spent a total of 180 minutes in interpretation and report writing, billed as one unit 96132 and two units 96133.

## 2019-03-08 NOTE — Telephone Encounter (Signed)
Left message to return call to our office.  

## 2019-03-08 NOTE — Progress Notes (Signed)
   Neuropsychology Note   [name] completed 130 minutes of neuropsychological testing with technician, Milana Kidney, B.S., under the supervision of Dr. Christia Reading, Ph.D., licensed neuropsychologist. The patient did not appear overtly distressed by the testing session, per behavioral observation or via self-report to the technician. Rest breaks were offered.    In considering the patient's current level of functioning, level of presumed impairment, nature of symptoms, emotional and behavioral responses during the interview, level of literacy, and observed level of motivation/effort, a battery of tests was selected and communicated to the psychometrician.   Communication between the psychologist and technician was ongoing throughout the testing session and changes were made as deemed necessary based on patient performance on testing, technician observations and additional pertinent factors such as those listed above.   [name] will return within approximately two weeks for an interactive feedback session with Dr. Melvyn Novas at which time his test performances, clinical impressions, and treatment recommendations will be reviewed in detail. The patient understands she can contact our office should she require our assistance before this time.   Full report to follow.  130 minutes were spent face-to-face with patient administering standardized tests and 15 minutes were spent scoring (technician). [CPT [id number], [id number]

## 2019-03-09 DIAGNOSIS — M797 Fibromyalgia: Secondary | ICD-10-CM | POA: Insufficient documentation

## 2019-03-09 NOTE — Telephone Encounter (Signed)
Pt called back. °

## 2019-03-09 NOTE — Telephone Encounter (Signed)
See note

## 2019-03-10 NOTE — Progress Notes (Deleted)
Virtual Visit via Video   Due to the COVID-19 pandemic, this visit was completed with telemedicine (audio/video) technology to reduce patient and provider exposure as well as to preserve personal protective equipment.   I connected with [name] by a video enabled telemedicine application and verified that I am speaking with the correct person using two identifiers. Location patient: Home Location provider: [facility], Office Persons participating in the virtual visit: GERANE DURNIL, Briscoe Deutscher, DO   I discussed the limitations of evaluation and management by telemedicine and the availability of in person appointments. The patient expressed understanding and agreed to proceed.  Care Team   Patient Care Team: Briscoe Deutscher, DO as PCP - General (Family Medicine) Magrinat, Virgie Dad, MD as Consulting Physician (Oncology) [name], MD as Consulting Physician (General Surgery) [name], MD as Consulting Physician (Obstetrics and Gynecology) Delice Bison, Charlestine Massed, NP as Nurse Practitioner (Hematology and Oncology) Kyung Rudd, MD as Consulting Physician (Radiation Oncology) Lahoma Rocker, MD as Referring Physician (Rheumatology) Shawnie Dapper, DO (Osteopathic Medicine) Pieter Partridge, DO as Consulting Physician (Neurology)  Subjective:   HPI:   ROS   Patient Active Problem List   Diagnosis Date Noted  . Fibromyalgia   . Major depressive disorder   . Generalized anxiety disorder with panic attacks   . Binge eating disorder 05/08/2018  . Chronic migraine w/o aura w/o status migrainosus, not intractable 05/08/2018  . Rheumatoid arthritis ([place]) 01/27/2018  . OSA (obstructive sleep apnea), could not tolerate CPAP 08/15/2017  . Hip flexor tightness, left 08/04/2017  . Vitamin D deficiency 02/19/2017  . Fatigue 01/28/2017  . Obesity (BMI 30-39.9) 01/28/2017  . Gastroesophageal reflux disease without esophagitis 01/28/2017  . Breast cancer of  upper-outer quadrant of left female breast ([place]) 12/11/2015  . Endometriosis 05/12/2014  . History of hysterectomy for benign disease 05/12/2014    Social History   Tobacco Use  . Smoking status: Never Smoker  . Smokeless tobacco: Never Used  Substance Use Topics  . Alcohol use: Yes    Alcohol/week: 0.0 standard drinks    Comment: socially     Current Outpatient Medications:  .  folic acid (FOLVITE) 1 MG tablet, Take 1 mg by mouth daily., Disp: , Rfl: 3 .  gabapentin (NEURONTIN) 100 MG capsule, Take 100 mg by mouth 3 (three) times daily. , Disp: , Rfl:  .  ipratropium (ATROVENT) 0.06 % nasal spray, PLACE 2 SPRAYS INTO BOTH NOSTRILS 4 (FOUR) TIMES DAILY., Disp: 15 mL, Rfl: 0 .  lisdexamfetamine (VYVANSE) 30 MG capsule, Take 1 capsule (30 mg total) by mouth daily before breakfast., Disp: 90 capsule, Rfl: 0 .  meloxicam (MOBIC) 15 MG tablet, TAKE 1 TABLET BY MOUTH EVERY DAY, Disp: 30 tablet, Rfl: 0 .  methocarbamol (ROBAXIN) 500 MG tablet, Take 1 tablet (500 mg total) by mouth at bedtime., Disp: 30 tablet, Rfl: 0 .  methotrexate (RHEUMATREX) 2.5 MG tablet, Take 2.5 mg by mouth once a week. Caution:Chemotherapy. Protect from light. Take 6 tab weekly, Disp: , Rfl:  .  omeprazole (PRILOSEC) 40 MG capsule, TAKE 1 TABLET BEFORE BREAKFAST AND BEFORE DINNER., Disp: 180 capsule, Rfl: 0 .  oxyCODONE-acetaminophen (PERCOCET/ROXICET) 5-325 MG tablet, Take 1 tablet by mouth 2 (two) times daily as needed for severe pain., Disp: , Rfl:  .  predniSONE (DELTASONE) 10 MG tablet, Take 1 tablet (10 mg total) by mouth daily with breakfast., Disp: 30 tablet, Rfl: 3 .  propranolol ER (INDERAL LA) 80 MG  24 hr capsule, Take 1 capsule (80 mg total) by mouth daily., Disp: 30 capsule, Rfl: 5 .  RESTASIS MULTIDOSE 0.05 % ophthalmic emulsion, Place 1 drop into both eyes 2 (two) times daily., Disp: , Rfl: 0 .  spironolactone (ALDACTONE) 25 MG tablet, TAKE 1 TABLET(25 MG) BY MOUTH DAILY, Disp: 90 tablet, Rfl: 0 .   SUMAtriptan (IMITREX) 100 MG tablet, Take 1 tablet earliest onset of headache.  May repeat x1 in 2 hours if headache persists or recurs.  Do not exceed 2 tablets in 24 hours., Disp: 10 tablet, Rfl: 5 .  triamcinolone (KENALOG) 0.025 % ointment, Apply 1 application topically 2 (two) times daily., Disp: 30 g, Rfl: 0 .  venlafaxine XR (EFFEXOR-XR) 150 MG 24 hr capsule, TAKE 1 CAPSULE(150 MG) BY MOUTH DAILY WITH BREAKFAST, Disp: 90 capsule, Rfl: 1 .  zolpidem (AMBIEN) 10 MG tablet, Take 1 tablet (10 mg total) by mouth at bedtime as needed for sleep., Disp: 30 tablet, Rfl: 1  Current Facility-Administered Medications:  .  ipratropium-albuterol (DUONEB) 0.5-2.5 (3) MG/3ML nebulizer solution 3 mL, 3 mL, Nebulization, Q6H, Gregory Dowe, DO, 3 mL at 08/10/18 1401  Allergies  Allergen Reactions  . Amoxicillin Shortness Of Breath and Itching    Has patient had a PCN reaction causing immediate rash, facial/tongue/throat swelling, SOB or lightheadedness with hypotension: Yes Has patient had a PCN reaction causing severe rash involving mucus membranes or skin necrosis: No Has patient had a PCN reaction that required hospitalization Yes Has patient had a PCN reaction occurring within the last 10 years: No If all of the above answers are "NO", then may proceed with Cephalosporin use.   . Diflucan [Fluconazole] Nausea Only    heartburn    Objective:   VITALS: Per patient if applicable, see vitals. GENERAL: Alert, appears well and in no acute distress. HEENT: Atraumatic, conjunctiva clear, no obvious abnormalities on inspection of external nose and ears. NECK: Normal movements of the head and neck. CARDIOPULMONARY: No increased WOB. Speaking in clear sentences. I:E ratio WNL.  MS: Moves all visible extremities without noticeable abnormality. PSYCH: Pleasant and cooperative, well-groomed. Speech normal rate and rhythm. Affect is appropriate. Insight and judgement are appropriate. Attention is focused,  linear, and appropriate.  NEURO: CN grossly intact. Oriented as arrived to appointment on time with no prompting. Moves both UE equally.  SKIN: No obvious lesions, wounds, erythema, or cyanosis noted on face or hands.  Depression screen [facility] 2/9 11/09/2018 08/10/2018 08/10/2018  Decreased Interest 2 3 3   Down, Depressed, Hopeless 3 2 2   PHQ - 2 Score 5 5 5   Altered sleeping 3 3 3   Tired, decreased energy 3 3 3   Change in appetite 3 3 3   Feeling bad or failure about yourself  3 1 1   Trouble concentrating 2 3 3   Moving slowly or fidgety/restless 0 3 3  Suicidal thoughts 1 1 1   PHQ-9 Score 20 22 22   Difficult doing work/chores Extremely dIfficult Extremely dIfficult Extremely dIfficult  Some recent data might be hidden    Assessment and Plan:   There are no diagnoses linked to this encounter.  Marland Kitchen COVID-19 Education: The signs and symptoms of COVID-19 were discussed with the patient and how to seek care for testing if needed. The importance of social distancing was discussed today. . Reviewed expectations re: course of current medical issues. . Discussed self-management of symptoms. . Outlined signs and symptoms indicating need for more acute intervention. . Patient verbalized understanding and all questions were  answered. Marland Kitchen Health Maintenance issues including appropriate healthy diet, exercise, and smoking avoidance were discussed with patient. . See orders for this visit as documented in the electronic medical record.  Briscoe Deutscher, DO  Records requested if needed. Time spent: *** minutes, of which >50% was spent in obtaining information about her symptoms, reviewing her previous labs, evaluations, and treatments, counseling her about her condition (please see the discussed topics above), and developing a plan to further investigate it; she had a number of questions which I addressed.

## 2019-03-11 ENCOUNTER — Telehealth: Payer: Self-pay | Admitting: Physical Therapy

## 2019-03-11 ENCOUNTER — Ambulatory Visit: Payer: BLUE CROSS/BLUE SHIELD | Admitting: Family Medicine

## 2019-03-11 MED ORDER — OXYCODONE-ACETAMINOPHEN 5-325 MG PO TABS: 1.0000 | ORAL_TABLET | Freq: Two times a day (BID) | ORAL | 0 refills | Status: DC | PRN

## 2019-03-11 NOTE — Telephone Encounter (Signed)
Copied from [place] [phone]. Topic: General - Other >> Mar 11, 2019  8:07 AM Celene Kras A wrote: Reason for CRM: Pt got a call to reschedule her appt this morning. Pt rescheduled appt but is requesting to have some pain medication sent in until her appt. Please advise.

## 2019-03-11 NOTE — Telephone Encounter (Signed)
Called and spoke with patient. She says that she is in a lot of pain from her Fibromyalgia and RA. She is having rib pain too. She is scheduled to have XR done and has a few other visits set up but she is hoping to get something called in for the pain in the interm until her visit with Dr. Juleen China on 03/16/19. She says that even if she can't get the Oxycodone, she is hoping something can be called in for her,   Called and spoke with [facility] in [place], advised that Oxycodone rx was last refilled in 11/2018.   Forwarding to Dr. Juleen China.

## 2019-03-11 NOTE — Telephone Encounter (Signed)
See previous telephone note. 

## 2019-03-11 NOTE — Addendum Note (Signed)
Addended by: Briscoe Deutscher R on: 03/11/2019 10:01 AM   Modules accepted: Orders

## 2019-03-15 ENCOUNTER — Telehealth (INDEPENDENT_AMBULATORY_CARE_PROVIDER_SITE_OTHER): Payer: Self-pay | Admitting: Psychology

## 2019-03-15 ENCOUNTER — Other Ambulatory Visit: Payer: Self-pay

## 2019-03-15 DIAGNOSIS — G4733 Obstructive sleep apnea (adult) (pediatric): Secondary | ICD-10-CM

## 2019-03-15 DIAGNOSIS — F410 Panic disorder [episodic paroxysmal anxiety] without agoraphobia: Secondary | ICD-10-CM

## 2019-03-15 DIAGNOSIS — F331 Major depressive disorder, recurrent, moderate: Secondary | ICD-10-CM

## 2019-03-15 DIAGNOSIS — F411 Generalized anxiety disorder: Secondary | ICD-10-CM

## 2019-03-15 NOTE — Progress Notes (Signed)
Virtual Visit via Video   Due to the COVID-19 pandemic, this visit was completed with telemedicine (audio/video) technology to reduce patient and provider exposure as well as to preserve personal protective equipment.   I connected with [name] by a video enabled telemedicine application and verified that I am speaking with the correct person using two identifiers. Location patient: Home Location provider: [facility], Office Persons participating in the virtual visit: ALYEA BARGE, Briscoe Deutscher, DO   I discussed the limitations of evaluation and management by telemedicine and the availability of in person appointments. The patient expressed understanding and agreed to proceed.  Care Team   Patient Care Team: Briscoe Deutscher, DO as PCP - General (Family Medicine) Magrinat, Virgie Dad, MD as Consulting Physician (Oncology) [name], MD as Consulting Physician (General Surgery) [name], MD as Consulting Physician (Obstetrics and Gynecology) Delice Bison, Charlestine Massed, NP as Nurse Practitioner (Hematology and Oncology) Kyung Rudd, MD as Consulting Physician (Radiation Oncology) Lahoma Rocker, MD as Referring Physician (Rheumatology) Shawnie Dapper, DO (Osteopathic Medicine) Pieter Partridge, DO as Consulting Physician (Neurology)  Subjective:   HPI: Patient in office for refill on Vyvanse. Patient feels like It works very well. She does take daily.   Within the past 3 months... Rarely Sometimes Often Always  During the past 3 months, have you had any episodes of excessive overeating?  x    Do you feel distressed about your excessive overeating?   x   During your episodes of excessive overeating, how often did you feel like you had no control over your eating?   x   During your episodes of excessive eating, how often did you continue to eat even though you were not hungry?   x   During your episodes of excessive overeating, how often were you embarrassed  by how much you ate?   x   During your episodes of excessive overeating, how often did you feel disgusted by yourself or guilty afterwards?    x  During the last 3 months, how often did you make yourself vomit as a means to control your weight or shape?  x     Neurology note reviewed. Need to focus on sleep. Dx of OSA, could not tolerate mask. Looking for alternatives.  Now, with full disability. Waiting for Medicaid.   Saw Rheumatology. X rays for bilateral rib pain. Has not heard back yet.   Review of Systems  Constitutional: Negative for chills and fever.  HENT: Negative for hearing loss and tinnitus.   Eyes: Negative for blurred vision and double vision.  Respiratory: Negative for cough and wheezing.   Cardiovascular: Negative for chest pain, palpitations and leg swelling.  Gastrointestinal: Negative for nausea and vomiting.  Genitourinary: Negative for dysuria and urgency.  Neurological: Negative for dizziness and headaches.  Psychiatric/Behavioral: Positive for depression. Negative for suicidal ideas.    Patient Active Problem List   Diagnosis Date Noted  . Fibromyalgia   . Major depressive disorder   . Generalized anxiety disorder with panic attacks   . Binge eating disorder 05/08/2018  . Chronic migraine w/o aura w/o status migrainosus, not intractable 05/08/2018  . Rheumatoid arthritis ([place]) 01/27/2018  . OSA (obstructive sleep apnea), could not tolerate CPAP 08/15/2017  . Hip flexor tightness, left 08/04/2017  . Vitamin D deficiency 02/19/2017  . Fatigue 01/28/2017  . Obesity (BMI 30-39.9) 01/28/2017  . Gastroesophageal reflux disease without esophagitis 01/28/2017  . Breast cancer of upper-outer quadrant of left  female breast ([place]) 12/11/2015  . Endometriosis 05/12/2014  . History of hysterectomy for benign disease 05/12/2014    Social History   Tobacco Use  . Smoking status: Never Smoker  . Smokeless tobacco: Never Used  Substance Use Topics  . Alcohol use:  Yes    Alcohol/week: 0.0 standard drinks    Comment: socially    Current Outpatient Medications:  .  gabapentin (NEURONTIN) 100 MG capsule, Take 100 mg by mouth 3 (three) times daily. , Disp: , Rfl:  .  ipratropium (ATROVENT) 0.06 % nasal spray, PLACE 2 SPRAYS INTO BOTH NOSTRILS 4 (FOUR) TIMES DAILY., Disp: 15 mL, Rfl: 0 .  lisdexamfetamine (VYVANSE) 30 MG capsule, Take 1 capsule (30 mg total) by mouth daily before breakfast., Disp: 90 capsule, Rfl: 0 .  meloxicam (MOBIC) 15 MG tablet, TAKE 1 TABLET BY MOUTH EVERY DAY, Disp: 30 tablet, Rfl: 0 .  methocarbamol (ROBAXIN) 500 MG tablet, Take 1 tablet (500 mg total) by mouth at bedtime., Disp: 30 tablet, Rfl: 0 .  omeprazole (PRILOSEC) 40 MG capsule, TAKE 1 TABLET BEFORE BREAKFAST AND BEFORE DINNER., Disp: 180 capsule, Rfl: 0 .  oxyCODONE-acetaminophen (PERCOCET/ROXICET) 5-325 MG tablet, Take 1 tablet by mouth 2 (two) times daily as needed for severe pain., Disp: 60 tablet, Rfl: 0 .  predniSONE (DELTASONE) 10 MG tablet, Take 1 tablet (10 mg total) by mouth daily with breakfast. (Patient taking differently: Take 5 mg by mouth daily with breakfast. ), Disp: 30 tablet, Rfl: 3 .  propranolol ER (INDERAL LA) 80 MG 24 hr capsule, Take 1 capsule (80 mg total) by mouth daily., Disp: 30 capsule, Rfl: 5 .  RESTASIS MULTIDOSE 0.05 % ophthalmic emulsion, Place 1 drop into both eyes 2 (two) times daily., Disp: , Rfl: 0 .  spironolactone (ALDACTONE) 25 MG tablet, TAKE 1 TABLET(25 MG) BY MOUTH DAILY, Disp: 90 tablet, Rfl: 0 .  SUMAtriptan (IMITREX) 100 MG tablet, Take 1 tablet earliest onset of headache.  May repeat x1 in 2 hours if headache persists or recurs.  Do not exceed 2 tablets in 24 hours., Disp: 10 tablet, Rfl: 5 .  triamcinolone (KENALOG) 0.025 % ointment, Apply 1 application topically 2 (two) times daily., Disp: 30 g, Rfl: 0 .  venlafaxine XR (EFFEXOR-XR) 150 MG 24 hr capsule, TAKE 1 CAPSULE(150 MG) BY MOUTH DAILY WITH BREAKFAST, Disp: 90 capsule, Rfl:  1 .  zolpidem (AMBIEN) 10 MG tablet, Take 1 tablet (10 mg total) by mouth at bedtime as needed for sleep., Disp: 30 tablet, Rfl: 1 .  [START ON 04/10/2019] lisdexamfetamine (VYVANSE) 30 MG capsule, Take 1 capsule (30 mg total) by mouth daily before breakfast., Disp: 90 capsule, Rfl: 0  Allergies  Allergen Reactions  . Amoxicillin Shortness Of Breath and Itching    Has patient had a PCN reaction causing immediate rash, facial/tongue/throat swelling, SOB or lightheadedness with hypotension: Yes Has patient had a PCN reaction causing severe rash involving mucus membranes or skin necrosis: No Has patient had a PCN reaction that required hospitalization Yes Has patient had a PCN reaction occurring within the last 10 years: No If all of the above answers are "NO", then may proceed with Cephalosporin use.   . Diflucan [Fluconazole] Nausea Only    heartburn    Objective:   VITALS: Per patient if applicable, see vitals. GENERAL: Alert, appears well and in no acute distress. HEENT: Atraumatic, conjunctiva clear, no obvious abnormalities on inspection of external nose and ears. NECK: Normal movements of the head and neck.  CARDIOPULMONARY: No increased WOB. Speaking in clear sentences. I:E ratio WNL.  MS: Moves all visible extremities without noticeable abnormality. PSYCH: Pleasant and cooperative, well-groomed. Speech normal rate and rhythm. Affect is appropriate. Insight and judgement are appropriate. Attention is focused, linear, and appropriate.  NEURO: CN grossly intact. Oriented as arrived to appointment on time with no prompting. Moves both UE equally.  SKIN: No obvious lesions, wounds, erythema, or cyanosis noted on face or hands.  Depression screen [facility] 2/9 03/16/2019 11/09/2018 08/10/2018  Decreased Interest 2 2 3   Down, Depressed, Hopeless 0 3 2  PHQ - 2 Score 2 5 5   Altered sleeping 3 3 3   Tired, decreased energy 3 3 3   Change in appetite 0 3 3  Feeling bad or failure about yourself  2 3  1   Trouble concentrating 1 2 3   Moving slowly or fidgety/restless 0 0 3  Suicidal thoughts 0 1 1  PHQ-9 Score 11 20 22   Difficult doing work/chores Somewhat difficult Extremely dIfficult Extremely dIfficult  Some recent data might be hidden   Assessment and Plan:   Kobi was seen today for follow-up.  Diagnoses and all orders for this visit:  Moderate episode of recurrent major depressive disorder ([place])  Binge eating disorder -     lisdexamfetamine (VYVANSE) 30 MG capsule; Take 1 capsule (30 mg total) by mouth daily before breakfast.  Rheumatoid arthritis, involving unspecified site, unspecified rheumatoid factor presence (H[facility])  OSA (obstructive sleep apnea), could not tolerate CPAP  Obesity (BMI 30-39.9)  Doing fairly well. Reviewed medication list. Using many prn and sparingly. Discussed nasal pillow option for OSA. Recommended Integrative Therapies for pain management.   Marland Kitchen COVID-19 Education: The signs and symptoms of COVID-19 were discussed with the patient and how to seek care for testing if needed. The importance of social distancing was discussed today. . Reviewed expectations re: course of current medical issues. . Discussed self-management of symptoms. . Outlined signs and symptoms indicating need for more acute intervention. . Patient verbalized understanding and all questions were answered. Marland Kitchen Health Maintenance issues including appropriate healthy diet, exercise, and smoking avoidance were discussed with patient. . See orders for this visit as documented in the electronic medical record.  Briscoe Deutscher, DO  Records requested if needed. Time spent: 25 minutes, of which >50% was spent in obtaining information about her symptoms, reviewing her previous labs, evaluations, and treatments, counseling her about her condition (please see the discussed topics above), and developing a plan to further investigate it; she had a number of questions which I addressed.

## 2019-03-15 NOTE — Progress Notes (Signed)
   NEUROPSYCHOLOGICAL EVALUATION - Feedback [facility] Department of Neurology  Virtual Visit Note: The purpose of this virtual visit is to provide medical care while limiting exposure to the novel coronavirus.    Consent was obtained for phone visit:  Yes.   Answered questions that patient had about telehealth interaction:  Yes.    I discussed the limitations, risks, security and privacy concerns of performing an evaluation and management service by telephone. I also discussed with the patient that there may be a patient responsible charge related to this service. The patient expressed understanding and agreed to proceed.  Patient location: Home Physician Location: Office Name of referring provider: Metta Clines, D.O. I connected with [name] on 03/15/2019 at 12:57 PM EDT by virtual platform [email or web address])and verified that I was speaking with the correct person using two identifiers.  Patient MRN: [id number] Patient DOB: 1976-05-19  Reason for Referral:   [name] Harrisis a 43 y.o. African-American female referred by Metta Clines, D.O.,to characterize hercurrent cognitive functioning and assist with diagnostic clarity and treatment planning in the context of subjective cognitive decline, several psychiatric comorbidities, and a history of breast cancer and associated treatment.  Feedback:   [name] completed a comprehensive neuropsychological evaluation on 03/08/2019. Briefly, results suggested notable performance variability across all assessed cognitive domains. Prominent weaknesses were exhibited across aspects of learning and memory (especially encoding abilities) and both expressive and receptive language. A relative strength was exhibited across visuospatial functioning. Relative to her prior neuropsychological evaluation in November 2018, [name]' scores are largely consistent. Performance declines were observed across domains of receptive and  expressive language, while subtle improvements were noted across some aspects of processing speed and verbal learning and memory. The etiology of her deficits are likely multifactorial in nature, with the most profound influences being chronic and persisting sleep disturbances, untreated sleep apnea, and poorly managed symptoms of psychiatric distress (moderate levels of both anxiety and depression). Recommendations included methods for improving her sleep and mood-related concerns.  [name] was unaccompanied. Content of the current session focused on possible etiologies for day-to-day cognitive difficulties. [name] was given the opportunity to ask questions and all her questions were answered. She was also encouraged to reach out should additional questions arise.     A total of 20 minutes were spent with [name] during the current feedback session.

## 2019-03-16 ENCOUNTER — Ambulatory Visit (INDEPENDENT_AMBULATORY_CARE_PROVIDER_SITE_OTHER): Payer: Self-pay | Admitting: Family Medicine

## 2019-03-16 ENCOUNTER — Encounter: Payer: Self-pay | Admitting: Family Medicine

## 2019-03-16 VITALS — Temp 98.7°F | Ht 64.0 in | Wt 208.0 lb

## 2019-03-16 DIAGNOSIS — M069 Rheumatoid arthritis, unspecified: Secondary | ICD-10-CM

## 2019-03-16 DIAGNOSIS — E669 Obesity, unspecified: Secondary | ICD-10-CM

## 2019-03-16 DIAGNOSIS — F331 Major depressive disorder, recurrent, moderate: Secondary | ICD-10-CM

## 2019-03-16 DIAGNOSIS — G4733 Obstructive sleep apnea (adult) (pediatric): Secondary | ICD-10-CM

## 2019-03-16 DIAGNOSIS — F5081 Binge eating disorder: Secondary | ICD-10-CM

## 2019-03-21 ENCOUNTER — Encounter: Payer: Self-pay | Admitting: Family Medicine

## 2019-03-21 MED ORDER — LISDEXAMFETAMINE DIMESYLATE 30 MG PO CAPS: 30.0000 mg | ORAL_CAPSULE | Freq: Every day | ORAL | 0 refills | Status: DC

## 2019-03-22 ENCOUNTER — Encounter: Payer: Self-pay | Admitting: Family Medicine

## 2019-03-22 DIAGNOSIS — F5081 Binge eating disorder: Secondary | ICD-10-CM

## 2019-03-23 ENCOUNTER — Telehealth: Payer: Self-pay | Admitting: Physical Therapy

## 2019-03-23 NOTE — Telephone Encounter (Signed)
Copied from [place] [id number]. Topic: General - Other >> Mar 23, 2019  1:33 PM Rainey Pines A wrote: Patient stated that she purchased a cpap machine and stated that it will not be sent  to her until a prescription has been received. Patient stated its coming from  [facility]  care team number is [phone]. Order number is [id number]. Patient also requesting a callback once this is complete.

## 2019-03-23 NOTE — Telephone Encounter (Signed)
Make sure that I did not send in Vyvanse already. Tee up 3 months worth if not already completed. Let's get her to Sports Medicine for the rib pain.

## 2019-03-24 ENCOUNTER — Other Ambulatory Visit: Payer: Self-pay

## 2019-03-24 NOTE — Telephone Encounter (Signed)
2nd call.  Same issue as yesterday.  Needs order for CPAP machine signed

## 2019-03-24 NOTE — Telephone Encounter (Signed)
Pt calling and stated a fax was sent over to sign for her cpap machine and [facility] is waiting for fax back. please call pt to advise asap

## 2019-03-24 NOTE — Telephone Encounter (Signed)
Called company needs order faxed to [phone] with demo NPI, Signature and diagnosis

## 2019-03-24 NOTE — Telephone Encounter (Signed)
Called office need order with Cpap settings and recommended type.? I looked on the sleep study but didn't see anything? Can you help me out

## 2019-03-27 NOTE — Telephone Encounter (Signed)
From 11/24/27 note, Dr. Halford Chessman: CPAP 10/20/17 to 11/18/17 >> used on 27 of 30 nights with average 4 hrs 50 min.  Average AHI 0.8 with CPAP 6 cm H2O. Report shows good control of sleep apnea with current settings.

## 2019-03-27 NOTE — Telephone Encounter (Signed)
Ready to send. Message sent to pt to see if she wants referral to sports med.

## 2019-03-28 ENCOUNTER — Other Ambulatory Visit: Payer: Self-pay

## 2019-03-28 DIAGNOSIS — G4733 Obstructive sleep apnea (adult) (pediatric): Secondary | ICD-10-CM

## 2019-03-28 NOTE — Telephone Encounter (Signed)
Kristen with [facility] is calling to requesting the prescription to be sent back over.  [phone] back Number

## 2019-03-28 NOTE — Telephone Encounter (Signed)
See note

## 2019-03-28 NOTE — Telephone Encounter (Signed)
Order faxed with all information

## 2019-04-01 ENCOUNTER — Telehealth: Payer: Self-pay | Admitting: *Deleted

## 2019-04-01 ENCOUNTER — Telehealth: Payer: Self-pay | Admitting: Physical Therapy

## 2019-04-01 NOTE — Telephone Encounter (Signed)
>>   Apr 01, 2019 12:25 PM Jodie Echevaria wrote: Langley Gauss with [facility] called to check status of Rx sent over on 03/23/2019, 03/24/2019 and will be sending it again today states that the patient is in need of this machine so if they could get it back today please or call with any information. care team number is [phone]. Order number is [id number]. Copied from [place] [id number]. Topic: General - Other >> Mar 23, 2019  1:33 PM Rainey Pines A wrote: Patient stated that she purchased a cpap machine and stated that it will not be sent  to her until a prescription has been received. Patient stated its coming from  [place] [facility]  care team number is [phone]. Order number is [id number]. Patient also requesting a callback once this is complete.

## 2019-04-01 NOTE — Telephone Encounter (Signed)
Per JoEllen - yeah i put in fax this am

## 2019-04-01 NOTE — Telephone Encounter (Signed)
Copied from [place] [id number]. Topic: General - Other >> Mar 23, 2019  1:33 PM Rainey Pines A wrote: Patient stated that she purchased a cpap machine and stated that it will not be sent  to her until a prescription has been received. Patient stated its coming from  [facility]  care team number is [phone]. Order number is [id number]. Patient also requesting a callback once this is complete. >> Apr 01, 2019 12:25 PM Jodie Echevaria wrote: Langley Gauss with [facility] called to check status of Rx sent over on 03/23/2019, 03/24/2019 and will be sending it again today states that the patient is in need of this machine so if they could get it back today please or call with any information. care team number is [phone]. Order number is [id number].

## 2019-04-04 NOTE — Telephone Encounter (Signed)
See note

## 2019-04-04 NOTE — Telephone Encounter (Signed)
Pt called and stated that she has still not received cpap machine. Pt would like a call back from the nurse regarding. Please advise.    [phone]

## 2019-04-04 NOTE — Telephone Encounter (Signed)
Patient called to inform the doctor that there is no need to call her.  The cpap machine has been taken care of.  Thank you.

## 2019-04-08 ENCOUNTER — Other Ambulatory Visit: Payer: Self-pay

## 2019-04-08 ENCOUNTER — Ambulatory Visit: Payer: Self-pay | Admitting: Obstetrics and Gynecology
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
## 2019-04-11 ENCOUNTER — Telehealth: Payer: Self-pay | Admitting: Family Medicine

## 2019-04-11 NOTE — Telephone Encounter (Signed)
Requested medication (s) are due for refill today: yes  Requested medication (s) are on the active medication list: yes  Last refill:  01/07/2019  Future visit scheduled: no  Notes to clinic:  Refill cannot be delegated    Requested Prescriptions  Pending Prescriptions Disp Refills   zolpidem (AMBIEN) 10 MG tablet 30 tablet 1    Sig: Take 1 tablet (10 mg total) by mouth at bedtime as needed for sleep.     Not Delegated - Psychiatry:  Anxiolytics/Hypnotics Failed - 04/11/2019 12:49 PM      Failed - This refill cannot be delegated      Failed - Urine Drug Screen completed in last 360 days.      Passed - Valid encounter within last 6 months    Recent Outpatient Visits          3 weeks ago Moderate episode of recurrent major depressive disorder ([place])   [place] Wallace, [place], DO   5 months ago Atopic dermatitis, unspecified type   [place] Wallace, [place], DO   7 months ago Binge eating disorder   [place] Wallace, [place], DO   8 months ago Depression, major, single episode, in partial remission [facility])   [place] Wallace, [place], DO   9 months ago Sinusitis, unspecified chronicity, unspecified location   [facility] [address], Algis Greenhouse, MD

## 2019-04-11 NOTE — Telephone Encounter (Signed)
Medication Refill - Medication:  zolpidem (AMBIEN) 10 MG tablet   Has the patient contacted their pharmacy? Yes advised to call office.   Preferred Pharmacy (with phone number or street name):  [id number] [address], [place]. [phone] (Phone) [phone] (Fax)   Agent: Please be advised that RX refills may take up to 3 business days. We ask that you follow-up with your pharmacy.

## 2019-04-11 NOTE — Telephone Encounter (Signed)
See note

## 2019-04-12 MED ORDER — ZOLPIDEM TARTRATE 10 MG PO TABS: 10.0000 mg | ORAL_TABLET | Freq: Every evening | ORAL | 1 refills | Status: DC | PRN

## 2019-04-12 NOTE — Telephone Encounter (Signed)
Last OV 03/16/19

## 2019-04-25 IMAGING — MR MR HEAD WO/W CM
10 series · 48 of 48 positions shown · IV contrast (multihance)
Comparison: CT head 06/11/2017

CLINICAL DATA: Papilledema. History of breast cancer. Rule out
dural venous sinus thrombosis.

EXAM:
MRI HEAD AND ORBITS WITHOUT AND WITH CONTRAST
TECHNIQUE: Multiplanar, multiecho pulse sequences of the brain and surrounding
structures were obtained without and with intravenous contrast.
Multiplanar, multiecho pulse sequences of the orbits and surrounding
structures were obtained including fat saturation techniques, before
and after intravenous contrast administration.
CONTRAST:  19mL MULTIHANCE GADOBENATE DIMEGLUMINE 529 MG/ML IV SOLN

[Series 5: T1 · sagittal · 4.0mm · 0.75mm/px · 2 of 26 slices shown (1 of 3)]
[im 1/26]
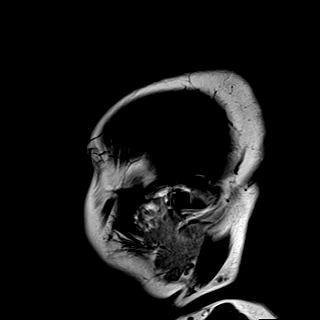
[im 26/26]
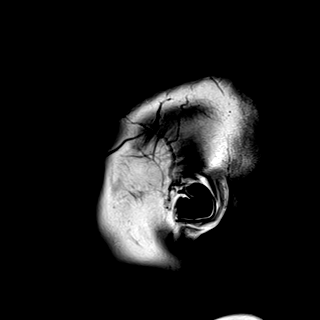

[Series 6: DWI · axial · 3.0mm · 1.44mm/px · z∈[-75,+64]mm · 6 of 80 slices shown (1 of 2)]
[im 1/80]
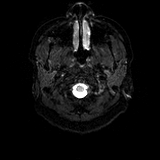
[im 16/80]
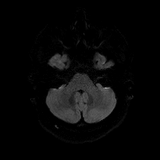
[im 32/80]
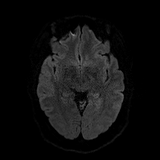
[im 48/80]
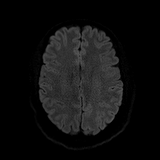
[im 64/80]
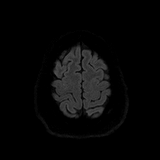
[im 80/80]
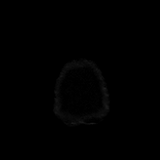

[Series 7: DWI · axial · 3.0mm · 1.44mm/px · z∈[-75,+64]mm · 3 of 39 slices shown (2 of 2)]
[im 1/39]
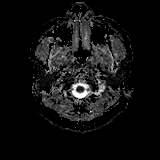
[im 20/39]
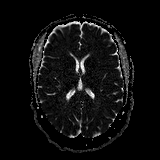
[im 39/39]
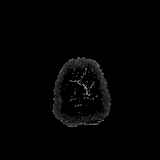

[Series 8: T2 · axial · 4.0mm · 0.36mm/px · z∈[-75,+59]mm · 4 of 54 slices shown]
[im 1/54]
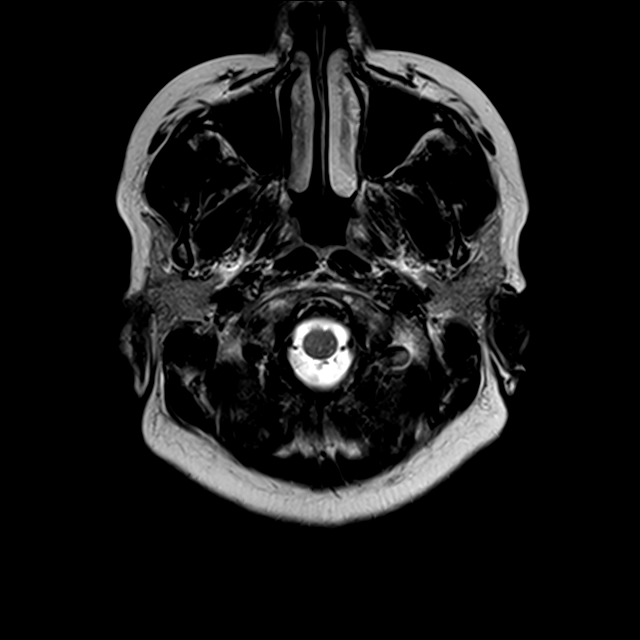
[im 18/54]
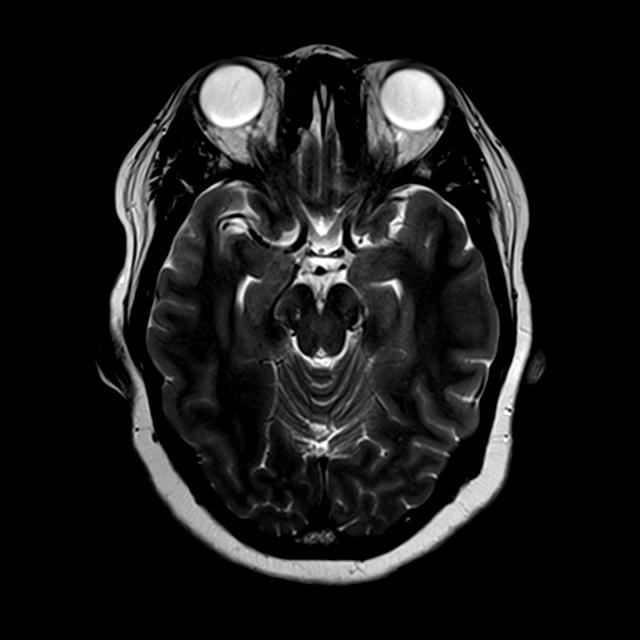
[im 36/54]
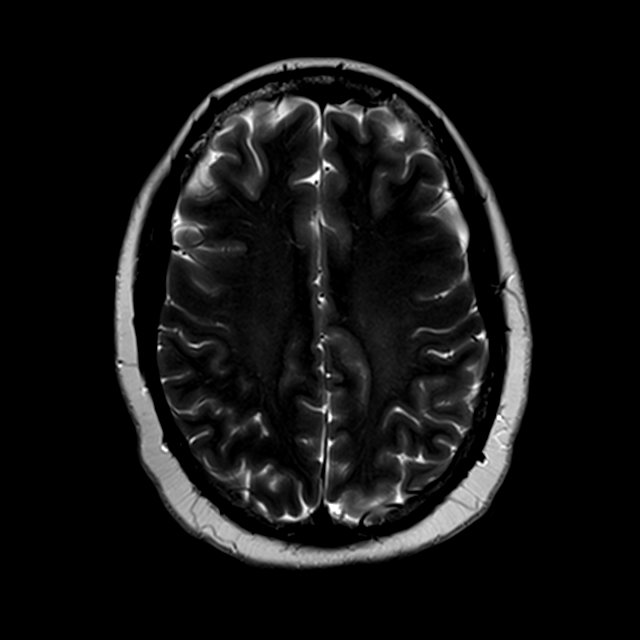
[im 54/54]
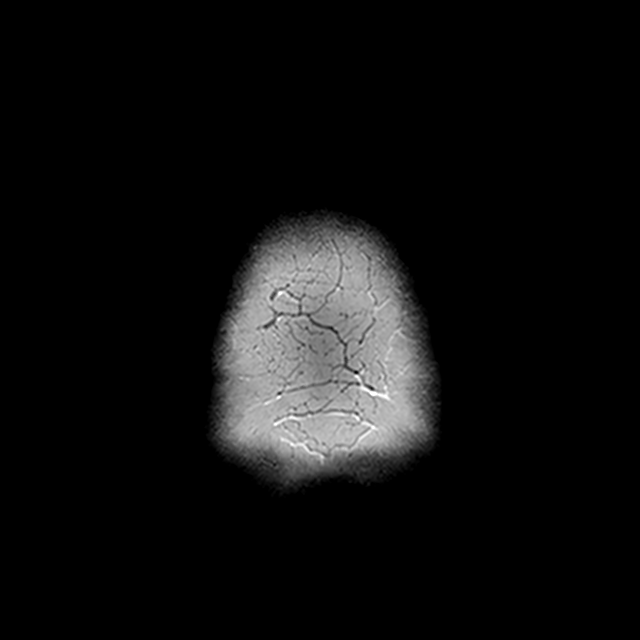

[Series 9: FLAIR · axial · 3.0mm · 0.72mm/px · z∈[-82,+67]mm · 2 of 26 slices shown]
[im 1/26]
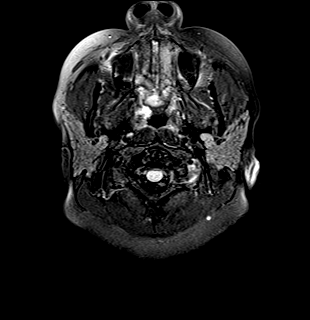
[im 26/26]
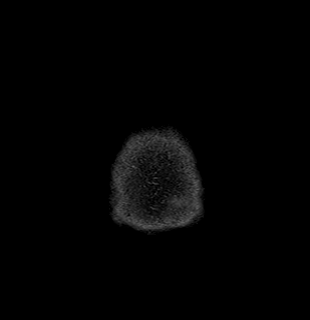

[Series 11: swi_images · axial · 1.5mm · 0.90mm/px · z∈[-77,+65]mm · 7 of 96 slices shown]
[im 1/96]
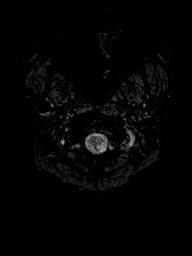
[im 16/96]
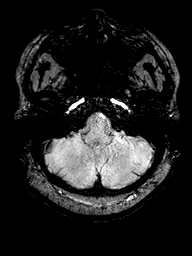
[im 32/96]
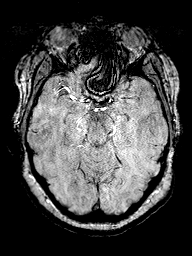
[im 48/96]
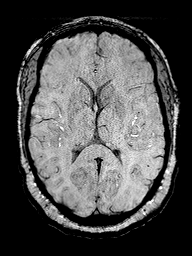
[im 64/96]
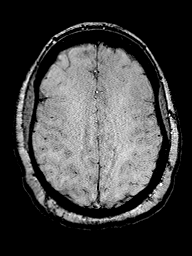
[im 80/96]
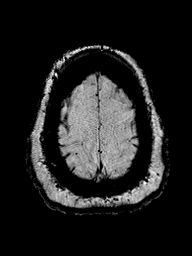
[im 96/96]
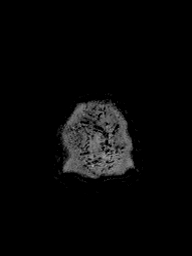

[Series 12: T1 · axial · 1.0mm · 0.90mm/px · z∈[-77,+65]mm · 10 of 144 slices shown (2 of 3)]
[im 1/144]
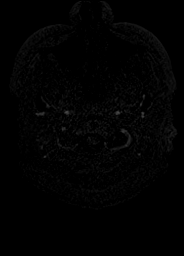
[im 16/144]
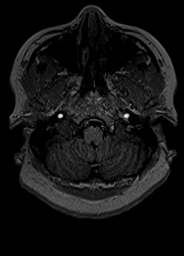
[im 32/144]
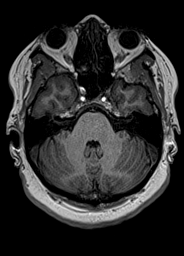
[im 48/144]
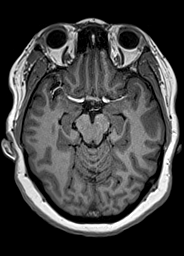
[im 64/144]
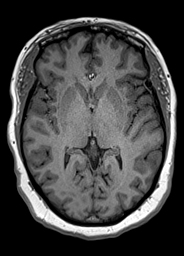
[im 80/144]
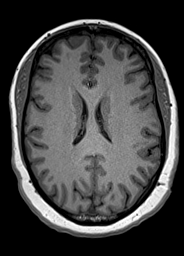
[im 96/144]
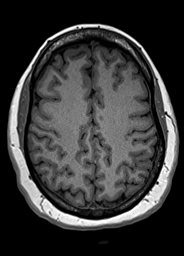
[im 112/144]
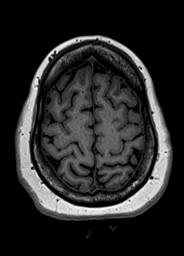
[im 128/144]
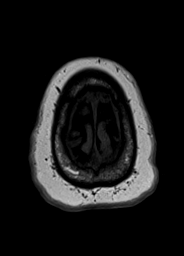
[im 144/144]
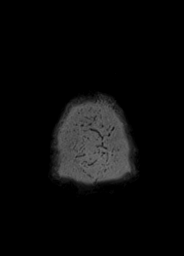

[Series 17: T2 post-contrast · coronal · 4.5mm · 0.36mm/px · 2 of 30 slices shown]
[im 1/30]
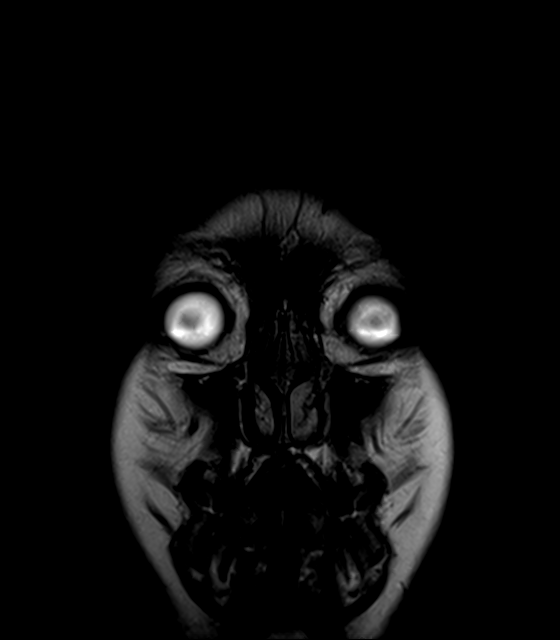
[im 30/30]
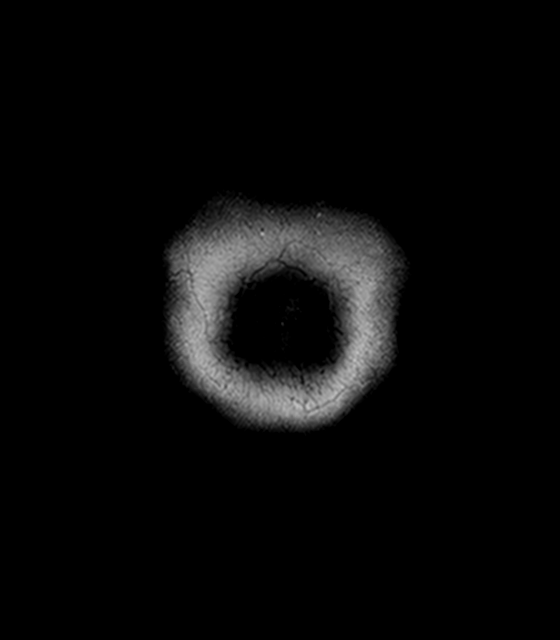

[Series 18: T1 · axial · 1.0mm · 0.90mm/px · z∈[-134,+2]mm · 10 of 144 slices shown (3 of 3)]
[im 1/144]
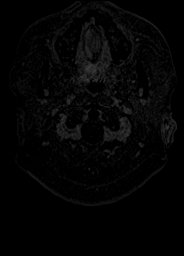
[im 16/144]
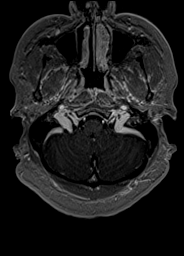
[im 32/144]
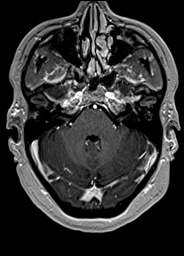
[im 48/144]
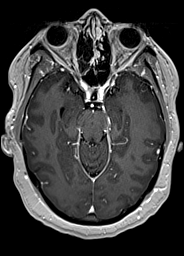
[im 64/144]
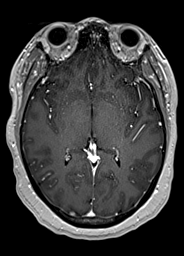
[im 80/144]
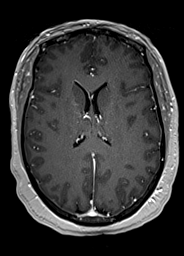
[im 96/144]
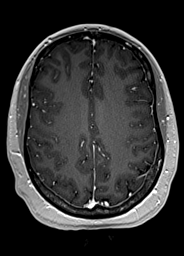
[im 112/144]
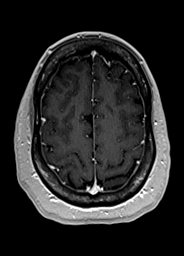
[im 128/144]
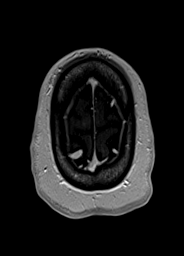
[im 144/144]
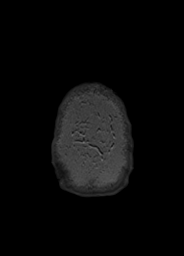

[Series 19: T1 post-contrast · coronal · 4.5mm · 0.90mm/px · 2 of 30 slices shown]
[im 1/30]
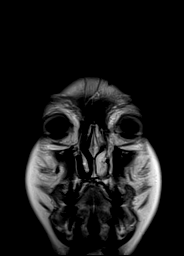
[im 30/30]
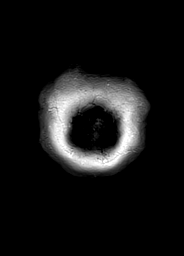

[48 of 48 positions shown; findings below may reference images not displayed]

FINDINGS: MRI HEAD FINDINGS

Brain: No acute infarction, hemorrhage, hydrocephalus, extra-axial
collection or mass lesion.

Normal enhancement of the brain following contrast infusion.
Negative for mass lesion. Leptomeningeal enhancement is normal.

Vascular: Normal arterial flow voids. Normal venous enhancement.
Negative for dural venous thrombosis.

Skull and upper cervical spine: Negative

Other: None

MRI ORBITS FINDINGS

Orbits: Normal globe. Optic nerve normal bilaterally. Extraocular
muscles normal. Orbital fat is normal. No orbital mass. Optic chiasm
normal. Cavernous sinus normal bilaterally.

Visualized sinuses: Mucosal edema and retained secretions in the
right ethmoid sinus. Mild mucosal edema throughout the paranasal
sinuses.

Soft tissues: Negative
IMPRESSION: 1. Negative MRI brain with contrast. Negative for metastatic
disease. Negative for dural sinus thrombosis
2. Normal MRI of the orbits bilaterally. Optic nerve is normal.
Negative for mass
3. Retained secretions right ethmoid sinus.

## 2019-04-28 ENCOUNTER — Other Ambulatory Visit: Payer: Self-pay

## 2019-04-28 DIAGNOSIS — E876 Hypokalemia: Secondary | ICD-10-CM

## 2019-04-28 DIAGNOSIS — I10 Essential (primary) hypertension: Secondary | ICD-10-CM

## 2019-04-28 MED ORDER — SPIRONOLACTONE 25 MG PO TABS: ORAL_TABLET | ORAL | 3 refills | Status: DC

## 2019-05-10 ENCOUNTER — Ambulatory Visit (INDEPENDENT_AMBULATORY_CARE_PROVIDER_SITE_OTHER): Payer: BLUE CROSS/BLUE SHIELD | Admitting: Psychology

## 2019-05-10 DIAGNOSIS — F321 Major depressive disorder, single episode, moderate: Secondary | ICD-10-CM | POA: Diagnosis not present

## 2019-05-11 ENCOUNTER — Ambulatory Visit: Payer: BLUE CROSS/BLUE SHIELD | Admitting: Family Medicine

## 2019-05-12 ENCOUNTER — Encounter (INDEPENDENT_AMBULATORY_CARE_PROVIDER_SITE_OTHER): Payer: Self-pay | Admitting: Family Medicine

## 2019-05-12 ENCOUNTER — Telehealth: Payer: Self-pay | Admitting: Family Medicine

## 2019-05-12 ENCOUNTER — Telehealth: Payer: Self-pay | Admitting: Obstetrics and Gynecology

## 2019-05-12 ENCOUNTER — Other Ambulatory Visit: Payer: Self-pay

## 2019-05-12 ENCOUNTER — Encounter: Payer: Self-pay | Admitting: Family Medicine

## 2019-05-12 NOTE — Telephone Encounter (Signed)
Copied from [place] [id number]. Topic: Quick Communication - Rx Refill/Question >> May 12, 2019 12:39 PM Carolyn Stare wrote: Medication oxyCODONE-acetaminophen (PERCOCET/ROXICET) 5-325 MG tablet  Pt is aware that Dr Juleen China is not here and is asking for a refill    Preferred [facility]acy [facility] [address]   Agent: Please be advised that RX refills may take up to 3 business days. We ask that you follow-up with your pharmacy.

## 2019-05-12 NOTE — Telephone Encounter (Signed)
Spoke with patient. Patient reports change in left breast, is unsure if a lump is present, "just feels different". Tender to touch. Hx of left breast cancer and reconstructive surgery, no implants. Denies nipple d/c.   Patient is scheduled for AEX on 05/13/19, has [place]. Advised patient further evaluation recommended. Reviewed option of OV with Dr. Talbert Nan, OV with PCP or establish care with NGYN in network.   Patient request to proceed with OV for now, her plan may change in January. AEX changed to OV on 11/6 at 1pm.   Routing to provider for final review. Patient is agreeable to disposition. Will close encounter.
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
## 2019-05-12 NOTE — Telephone Encounter (Signed)
Please review
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
## 2019-05-12 NOTE — Telephone Encounter (Signed)
Call to patient to review COVID screening questions prior to annual exam with Dr. Talbert Nan tomorrow. Patient stated that she found a breast lump last week and has a history of breast cancer.

## 2019-05-12 NOTE — Telephone Encounter (Signed)
Called pt at number listed in chart. Phone rang but never went to voicemail so I was not able to leave a message. I was calling pt to discuss the reason for her transfer of care appt with me tomorrow. Previous PCP Dr. Santina Evans is no longer with [place] [facility]. From appt note and review or chart, pt appears to be requesting a refill of her chronic narcotic pain medication. I wanted to make the patient aware that I do not prescribe chronic narcotics. I am happy to address any other health concerns/needs that she has, if any. If she does not feel I am a good fit as her new PCP, I wanted to give her the opportunity to scheduled with another provider. Will ask office staff to try again to reach pt tomorrow AM prior to her 9:30am appt.

## 2019-05-13 ENCOUNTER — Ambulatory Visit (INDEPENDENT_AMBULATORY_CARE_PROVIDER_SITE_OTHER): Payer: BLUE CROSS/BLUE SHIELD | Admitting: Obstetrics and Gynecology

## 2019-05-13 ENCOUNTER — Other Ambulatory Visit: Payer: Self-pay | Admitting: Obstetrics and Gynecology

## 2019-05-13 ENCOUNTER — Telehealth: Payer: Self-pay | Admitting: *Deleted

## 2019-05-13 ENCOUNTER — Ambulatory Visit (INDEPENDENT_AMBULATORY_CARE_PROVIDER_SITE_OTHER): Payer: BLUE CROSS/BLUE SHIELD | Admitting: Family Medicine

## 2019-05-13 ENCOUNTER — Encounter: Payer: Self-pay | Admitting: Family Medicine

## 2019-05-13 ENCOUNTER — Encounter: Payer: Self-pay | Admitting: Obstetrics and Gynecology

## 2019-05-13 VITALS — HR 113 | Temp 98.0°F | Ht 64.0 in | Wt 219.2 lb

## 2019-05-13 VITALS — BP 118/80 | HR 84 | Temp 97.0°F | Wt 218.8 lb

## 2019-05-13 DIAGNOSIS — N6323 Unspecified lump in the left breast, lower outer quadrant: Secondary | ICD-10-CM | POA: Diagnosis not present

## 2019-05-13 DIAGNOSIS — M797 Fibromyalgia: Secondary | ICD-10-CM

## 2019-05-13 DIAGNOSIS — F321 Major depressive disorder, single episode, moderate: Secondary | ICD-10-CM

## 2019-05-13 DIAGNOSIS — Z853 Personal history of malignant neoplasm of breast: Secondary | ICD-10-CM | POA: Diagnosis not present

## 2019-05-13 DIAGNOSIS — M069 Rheumatoid arthritis, unspecified: Secondary | ICD-10-CM | POA: Diagnosis not present

## 2019-05-13 DIAGNOSIS — E559 Vitamin D deficiency, unspecified: Secondary | ICD-10-CM

## 2019-05-13 DIAGNOSIS — K219 Gastro-esophageal reflux disease without esophagitis: Secondary | ICD-10-CM | POA: Diagnosis not present

## 2019-05-13 MED ORDER — OMEPRAZOLE 40 MG PO CPDR: 40.0000 mg | DELAYED_RELEASE_CAPSULE | Freq: Every day | ORAL | 3 refills | Status: DC

## 2019-05-13 MED ORDER — OXYCODONE-ACETAMINOPHEN 5-325 MG PO TABS: 1.0000 | ORAL_TABLET | Freq: Every day | ORAL | 0 refills | Status: DC

## 2019-05-13 MED ORDER — OMEPRAZOLE 40 MG PO CPDR: 40.0000 mg | DELAYED_RELEASE_CAPSULE | Freq: Every day | ORAL | 0 refills | Status: DC

## 2019-05-13 MED ORDER — VITAMIN D (ERGOCALCIFEROL) 1.25 MG (50000 UNIT) PO CAPS: 50000.0000 [IU] | ORAL_CAPSULE | ORAL | 0 refills | Status: DC

## 2019-05-13 MED ORDER — OXYCODONE-ACETAMINOPHEN 5-325 MG PO TABS: 1.0000 | ORAL_TABLET | Freq: Every day | ORAL | 0 refills | Status: AC

## 2019-05-13 NOTE — Telephone Encounter (Signed)
Thank you :)

## 2019-05-13 NOTE — Patient Instructions (Addendum)
Prilosec twice a day for next 2 weeks or as needed  Once weekly vitamin D for 12 weeks.  Turmeric 500mg  daily  Tart cherry extract 1200mg  at night Note in a week See me again in 4 weeks

## 2019-05-13 NOTE — Assessment & Plan Note (Signed)
Encourage taking the vitamin D regularly

## 2019-05-13 NOTE — Telephone Encounter (Addendum)
Patient was seen in office today for left breast lump. Orders placed for left breast Dx MMG and [place], if needed, at [facility].   Spoke with Ena Dawley at [facility]  Patient has BCBS [employer], has option of paying out of pocket or another location covered by plan.   Call placed to patient to review. Patient will contact [facility] directly to discuss and return call to provide update.

## 2019-05-13 NOTE — Telephone Encounter (Signed)
I spoke with pharmacy and they informed me that pt's insurance will only pay for 7 days at a time.  Once 7 days is up the medication would have to be re prescribed.  Pt will need a PA started to have the 30 day Rx.  Please advise.

## 2019-05-13 NOTE — Assessment & Plan Note (Addendum)
I believe the patient's left-sided rib pain is secondary to more of a ulcer.  Patient does have involuntary guarding but no rebound.  Patient has a history of GERD.  Given Prilosec, discussed avoiding oral anti-inflammatories if possible, discussed over-the-counter medications that could be beneficial, vitamin D as well, discussed diet changes.  Discussed red flags and when to seek medical attention.  With history of breast cancer did discuss the possibility of imaging earlier on.  Patient will consider this.  Follow-up with me again in 4 weeks otherwise.

## 2019-05-13 NOTE — Telephone Encounter (Signed)
Patient returned call, patient request to proceed with scheduling at [facility].    Call placed to [facility], spoke with Ena Dawley. Scheduled for 05/17/19 at 9:40am, arrive at 9:20am.   Patient notified of appt date and time. Patient verbalizes understanding and is agreeable.   Routing to provider for final review. Patient is agreeable to disposition. Will close encounter.

## 2019-05-13 NOTE — Telephone Encounter (Signed)
Ena Dawley returned call from [facility].   Order has been placed for left breast biopsy, if needed.   They have an earlier appt for 05/16/19 to offer patient, Ena Dawley will contact the patient directly.   Orders will need to be signed in [place].   Routing to Dr. Talbert Nan.
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
## 2019-05-16 ENCOUNTER — Ambulatory Visit
Admission: RE | Admit: 2019-05-16 | Discharge: 2019-05-16 | Disposition: A | Discharge status: Home / Self Care | Payer: BLUE CROSS/BLUE SHIELD | Source: Ambulatory Visit | Attending: Obstetrics and Gynecology | Admitting: Obstetrics and Gynecology

## 2019-05-16 ENCOUNTER — Ambulatory Visit: Payer: BLUE CROSS/BLUE SHIELD

## 2019-05-16 DIAGNOSIS — N6323 Unspecified lump in the left breast, lower outer quadrant: Secondary | ICD-10-CM

## 2019-05-16 DIAGNOSIS — Z853 Personal history of malignant neoplasm of breast: Secondary | ICD-10-CM

## 2019-05-17 ENCOUNTER — Other Ambulatory Visit: Payer: BLUE CROSS/BLUE SHIELD

## 2019-05-24 ENCOUNTER — Ambulatory Visit (INDEPENDENT_AMBULATORY_CARE_PROVIDER_SITE_OTHER): Payer: Self-pay | Admitting: Psychology

## 2019-05-24 DIAGNOSIS — F321 Major depressive disorder, single episode, moderate: Secondary | ICD-10-CM

## 2019-05-29 ENCOUNTER — Encounter: Payer: Self-pay | Admitting: Family Medicine

## 2019-05-29 DIAGNOSIS — M069 Rheumatoid arthritis, unspecified: Secondary | ICD-10-CM

## 2019-06-07 ENCOUNTER — Telehealth: Payer: Self-pay | Admitting: Neurology

## 2019-06-07 ENCOUNTER — Other Ambulatory Visit: Payer: Self-pay | Admitting: Family Medicine

## 2019-06-07 DIAGNOSIS — M797 Fibromyalgia: Secondary | ICD-10-CM

## 2019-06-07 DIAGNOSIS — E876 Hypokalemia: Secondary | ICD-10-CM

## 2019-06-07 DIAGNOSIS — I10 Essential (primary) hypertension: Secondary | ICD-10-CM

## 2019-06-07 MED ORDER — PROPRANOLOL HCL ER 80 MG PO CP24: 80.0000 mg | ORAL_CAPSULE | Freq: Every day | ORAL | 1 refills | Status: DC

## 2019-06-07 MED ORDER — PROPRANOLOL HCL ER 80 MG PO CP24: 80.0000 mg | ORAL_CAPSULE | Freq: Every day | ORAL | 5 refills | Status: DC

## 2019-06-07 NOTE — Addendum Note (Signed)
Addended by: Ranae Plumber on: 06/07/2019 09:04 AM   Modules accepted: Orders

## 2019-06-07 NOTE — Telephone Encounter (Signed)
Patient is needing [facility] on [facility]. Patient had already spoken with Pharm. Thanks!

## 2019-06-07 NOTE — Telephone Encounter (Signed)
Medication Refill - Medication:zolpidem (AMBIEN) 10 MG tablet /gabapentin (NEURONTIN) 100 MG capsule/spironolactone (ALDACTONE) 25 MG tablet   Regarding gabapentin pt stated she does not see the physician who prescribes her gabapentin and will not see a new physician to take over that rx until January. Requesting Dr. Loletha Grayer to fill until January.  Has the patient contacted their pharmacy? No. (Agent: If no, request that the patient contact the pharmacy for the refill.) (Agent: If yes, when and what did the pharmacy advise?)New Provider  Preferred Pharmacy (with phone number or street name):  [id number] [address], [place]. [phone] (Phone) [phone] (Fax)     Agent: Please be advised that RX refills may take up to 3 business days. We ask that you follow-up with your pharmacy.

## 2019-06-07 NOTE — Telephone Encounter (Signed)
Requested Prescriptions   Pending Prescriptions Disp Refills  . propranolol ER (INDERAL LA) 80 MG 24 hr capsule 30 capsule 5    Sig: Take 1 capsule (80 mg total) by mouth daily.   Rx last filled: 12/14/18 #30 5 refills  Pt last seen: 12/14/18   Follow up appt scheduled:06/16/19

## 2019-06-07 NOTE — Telephone Encounter (Signed)
Requested medication (s) are due for refill today: yes  Requested medication (s) are on the active medication list:yes  Last refill:  04/11/2019  Future visit scheduled: yes  Notes to clinic:  Review for refills Last filled by different provider  Regarding gabapentin pt stated she does not see the physician who prescribes her gabapentin and will not see a new physician to take over that rx until January. Requesting Dr. Loletha Grayer to fill until January.   Requested Prescriptions  Pending Prescriptions Disp Refills   zolpidem (AMBIEN) 10 MG tablet 30 tablet 1    Sig: Take 1 tablet (10 mg total) by mouth at bedtime as needed for sleep.     Not Delegated - Psychiatry:  Anxiolytics/Hypnotics Failed - 06/07/2019  8:40 AM      Failed - This refill cannot be delegated      Failed - Urine Drug Screen completed in last 360 days.      Passed - Valid encounter within last 6 months    Recent Outpatient Visits          3 weeks ago Gastroesophageal reflux disease without esophagitis   [facility] [place], Parker K, DO   3 weeks ago Gastroesophageal reflux disease without esophagitis   [place], [place], DO   3 weeks ago Erroneous encounter - disregard   [place] Parker, Algis Greenhouse, MD   2 months ago Moderate episode of recurrent major depressive disorder [facility])   [place] Wallace, [place], DO   7 months ago Atopic dermatitis, unspecified type   [place] Wallace, [place], DO      Future Appointments            In 1 week [place], Garvin Fila, DO [facility] [place], [place]   In 1 week Pieter Partridge, DO [facility] [place]   In 1 week [place], [place], [facility]            gabapentin (NEURONTIN) 100 MG capsule       Sig: Take 1 capsule (100 mg total) by mouth 3 (three) times daily.     Neurology:  Anticonvulsants - gabapentin Passed - 06/07/2019  8:40 AM      Passed - Valid encounter within last 12 months    Recent Outpatient Visits          3 weeks ago Gastroesophageal reflux disease without esophagitis   [facility] [place], Daisy K, DO   3 weeks ago Gastroesophageal reflux disease without esophagitis   [place], [place], DO   3 weeks ago Erroneous encounter - disregard   [place] Parker, Algis Greenhouse, MD   2 months ago Moderate episode of recurrent major depressive disorder [facility])   [place] Wallace, [place], DO   7 months ago Atopic dermatitis, unspecified type   [place] Wallace, [place], DO      Future Appointments            In 1 week [place], Garvin Fila, DO [facility] [place], [place]   In 1 week Pieter Partridge, DO [facility] [place]   In 1 week Lyndal Pulley, DO [facility], [facility]            spironolactone (ALDACTONE) 25 MG tablet 90 tablet 3    Sig: TAKE 1 TABLET(25  MG) BY MOUTH DAILY     Cardiovascular: Diuretics - Aldosterone Antagonist Failed - 06/07/2019  8:40 AM      Failed - Cr in normal range and within 360 days    Creatinine  Date Value Ref Range Status  12/16/2017 0.9 0.5 - 1.1 Final  06/23/2017 0.8 0.6 - 1.1 mg/dL Final   Creatinine, Ser  Date Value Ref Range Status  07/29/2017 0.81 0.40 - 1.20 mg/dL Final         Failed - K in normal range and within 360 days    Potassium  Date Value Ref Range Status  12/16/2017 4.5 3.4 - 5.3 Final  06/23/2017 3.1 (L) 3.5 - 5.1 mEq/L Final         Failed - Na in normal range and within 360 days    Sodium  Date Value Ref Range Status  12/16/2017 139 137 - 147 Final  06/23/2017 138 136 - 145 mEq/L Final         Passed - Last BP in normal range    BP Readings from Last 1 Encounters:  05/13/19 118/80         Passed - Valid  encounter within last 6 months    Recent Outpatient Visits          3 weeks ago Gastroesophageal reflux disease without esophagitis   [facility] [place], [place] K, DO   3 weeks ago Gastroesophageal reflux disease without esophagitis   [place], [place], DO   3 weeks ago Erroneous encounter - disregard   [place] Parker, [place], MD   2 months ago Moderate episode of recurrent major depressive disorder [facility])   [place] Wallace, [place], DO   7 months ago Atopic dermatitis, unspecified type   [place] Wallace, [place], DO      Future Appointments            In 1 week [place], Garvin Fila, DO [facility] [place], [place]   In 1 week Tomi Likens, [place] Minister, [facility] [facility] [place]   In 1 week Lyndal Pulley, [place], [facility]

## 2019-06-07 NOTE — Telephone Encounter (Signed)
Rx sent 

## 2019-06-08 MED ORDER — SPIRONOLACTONE 25 MG PO TABS: ORAL_TABLET | ORAL | 3 refills | Status: DC

## 2019-06-08 MED ORDER — ZOLPIDEM TARTRATE 10 MG PO TABS: 10.0000 mg | ORAL_TABLET | Freq: Every evening | ORAL | 0 refills | Status: DC | PRN

## 2019-06-08 MED ORDER — GABAPENTIN 100 MG PO CAPS: 100.0000 mg | ORAL_CAPSULE | Freq: Three times a day (TID) | ORAL | 1 refills | Status: DC
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
## 2019-06-15 ENCOUNTER — Telehealth (INDEPENDENT_AMBULATORY_CARE_PROVIDER_SITE_OTHER): Payer: BLUE CROSS/BLUE SHIELD | Admitting: Family Medicine

## 2019-06-15 ENCOUNTER — Encounter: Payer: Self-pay | Admitting: Family Medicine

## 2019-06-15 VITALS — Ht 64.0 in | Wt 215.0 lb

## 2019-06-15 DIAGNOSIS — F5081 Binge eating disorder: Secondary | ICD-10-CM

## 2019-06-15 DIAGNOSIS — R5383 Other fatigue: Secondary | ICD-10-CM | POA: Diagnosis not present

## 2019-06-15 DIAGNOSIS — R110 Nausea: Secondary | ICD-10-CM | POA: Diagnosis not present

## 2019-06-15 MED ORDER — ONDANSETRON HCL 4 MG PO TABS: 4.0000 mg | ORAL_TABLET | Freq: Three times a day (TID) | ORAL | 0 refills | Status: DC | PRN

## 2019-06-15 MED ORDER — LISDEXAMFETAMINE DIMESYLATE 40 MG PO CAPS: 40.0000 mg | ORAL_CAPSULE | ORAL | 0 refills | Status: DC
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
## 2019-06-16 ENCOUNTER — Other Ambulatory Visit: Payer: Self-pay

## 2019-06-16 ENCOUNTER — Encounter: Payer: Self-pay | Admitting: Neurology

## 2019-06-16 ENCOUNTER — Telehealth: Payer: BLUE CROSS/BLUE SHIELD | Admitting: Neurology

## 2019-06-16 DIAGNOSIS — Z029 Encounter for administrative examinations, unspecified: Secondary | ICD-10-CM

## 2019-06-17 ENCOUNTER — Ambulatory Visit: Payer: BLUE CROSS/BLUE SHIELD | Admitting: Family Medicine

## 2019-06-20 ENCOUNTER — Other Ambulatory Visit: Payer: Self-pay

## 2019-06-20 ENCOUNTER — Other Ambulatory Visit: Payer: Self-pay | Admitting: Nurse Practitioner

## 2019-06-20 ENCOUNTER — Emergency Department (HOSPITAL_BASED_OUTPATIENT_CLINIC_OR_DEPARTMENT_OTHER): Payer: BLUE CROSS/BLUE SHIELD

## 2019-06-20 ENCOUNTER — Encounter (HOSPITAL_BASED_OUTPATIENT_CLINIC_OR_DEPARTMENT_OTHER): Payer: Self-pay | Admitting: *Deleted

## 2019-06-20 ENCOUNTER — Emergency Department (HOSPITAL_BASED_OUTPATIENT_CLINIC_OR_DEPARTMENT_OTHER)
Admission: EM | Admit: 2019-06-20 | Discharge: 2019-06-20 | Disposition: A | Discharge status: Home / Self Care | Payer: BLUE CROSS/BLUE SHIELD | Attending: Emergency Medicine | Admitting: Emergency Medicine

## 2019-06-20 DIAGNOSIS — Z79899 Other long term (current) drug therapy: Secondary | ICD-10-CM | POA: Insufficient documentation

## 2019-06-20 DIAGNOSIS — U071 COVID-19: Secondary | ICD-10-CM

## 2019-06-20 DIAGNOSIS — E669 Obesity, unspecified: Secondary | ICD-10-CM

## 2019-06-20 DIAGNOSIS — R05 Cough: Secondary | ICD-10-CM | POA: Insufficient documentation

## 2019-06-20 DIAGNOSIS — R519 Headache, unspecified: Secondary | ICD-10-CM | POA: Diagnosis not present

## 2019-06-20 DIAGNOSIS — R110 Nausea: Secondary | ICD-10-CM | POA: Diagnosis present

## 2019-06-20 LAB — COMPREHENSIVE METABOLIC PANEL
ALT: 27 U/L (ref 0–44)
AST: 19 U/L (ref 15–41)
Albumin: 4.1 g/dL (ref 3.5–5.0)
Alkaline Phosphatase: 86 U/L (ref 38–126)
Anion gap: 10 (ref 5–15)
BUN: 8 mg/dL (ref 6–20)
CO2: 24 mmol/L (ref 22–32)
Calcium: 9.2 mg/dL (ref 8.9–10.3)
Chloride: 102 mmol/L (ref 98–111)
Creatinine, Ser: 0.97 mg/dL (ref 0.44–1.00)
GFR calc Af Amer: >60 mL/min (ref >60)
GFR calc non Af Amer: >60 mL/min (ref >60)
Glucose, Bld: 111 mg/dL — ABNORMAL HIGH (ref 70–99)
Potassium: 3.3 mmol/L — ABNORMAL LOW (ref 3.5–5.1)
Sodium: 136 mmol/L (ref 135–145)
Total Bilirubin: 0.4 mg/dL (ref 0.3–1.2)
Total Protein: 7.9 g/dL (ref 6.5–8.1)

## 2019-06-20 LAB — CBC WITH DIFFERENTIAL/PLATELET
Abs Immature Granulocytes: 0.04 10*3/uL (ref 0.00–0.07)
Basophils Absolute: 0 10*3/uL (ref 0.0–0.1)
Basophils Relative: 0 %
Eosinophils Absolute: 0 10*3/uL (ref 0.0–0.5)
Eosinophils Relative: 0 %
HCT: 37.7 % (ref 36.0–46.0)
Hemoglobin: 12.9 g/dL (ref 12.0–15.0)
Immature Granulocytes: 1 %
Lymphocytes Relative: 7 %
Lymphs Abs: 0.5 10*3/uL — ABNORMAL LOW (ref 0.7–4.0)
MCH: 30.2 pg (ref 26.0–34.0)
MCHC: 34.2 g/dL (ref 30.0–36.0)
MCV: 88.3 fL (ref 80.0–100.0)
Monocytes Absolute: 1.5 10*3/uL — ABNORMAL HIGH (ref 0.1–1.0)
Monocytes Relative: 18 %
Neutro Abs: 6.1 10*3/uL (ref 1.7–7.7)
Neutrophils Relative %: 74 %
Platelets: 304 10*3/uL (ref 150–400)
RBC: 4.27 MIL/uL (ref 3.87–5.11)
RDW: 13.3 % (ref 11.5–15.5)
WBC: 8.2 10*3/uL (ref 4.0–10.5)
nRBC: 0 % (ref 0.0–0.2)

## 2019-06-20 LAB — SARS CORONAVIRUS 2 AG (30 MIN TAT): SARS Coronavirus 2 Ag: POSITIVE — AB

## 2019-06-20 MED ORDER — POTASSIUM CHLORIDE CRYS ER 20 MEQ PO TBCR
40.0000 meq | EXTENDED_RELEASE_TABLET | Freq: Once | ORAL | Status: AC
  Administered 2019-06-20: 40 meq via ORAL
  Filled 2019-06-20: qty 2

## 2019-06-20 MED ORDER — KETOROLAC TROMETHAMINE 30 MG/ML IJ SOLN
30.0000 mg | Freq: Once | INTRAMUSCULAR | Status: AC
  Administered 2019-06-20: 30 mg via INTRAVENOUS
  Filled 2019-06-20: qty 1

## 2019-06-20 MED ORDER — DIPHENHYDRAMINE HCL 50 MG/ML IJ SOLN
12.5000 mg | Freq: Once | INTRAMUSCULAR | Status: AC
  Administered 2019-06-20: 12.5 mg via INTRAVENOUS
  Filled 2019-06-20: qty 1

## 2019-06-20 MED ORDER — SODIUM CHLORIDE 0.9 % IV BOLUS
1000.0000 mL | Freq: Once | INTRAVENOUS | Status: AC
  Administered 2019-06-20: 1000 mL via INTRAVENOUS

## 2019-06-20 MED ORDER — ONDANSETRON 4 MG PO TBDP: 4.0000 mg | ORAL_TABLET | Freq: Three times a day (TID) | ORAL | 0 refills | Status: DC | PRN

## 2019-06-20 MED ORDER — METOCLOPRAMIDE HCL 5 MG/ML IJ SOLN
5.0000 mg | Freq: Once | INTRAMUSCULAR | Status: AC
  Administered 2019-06-20: 5 mg via INTRAVENOUS
  Filled 2019-06-20: qty 2

## 2019-06-20 NOTE — ED Notes (Signed)
ED Provider at bedside. 

## 2019-06-20 NOTE — ED Triage Notes (Signed)
Headache, nausea and headache started yesterday.  Mother is positive for covid.

## 2019-06-20 NOTE — Progress Notes (Signed)
  I connected by phone with [name] on 06/20/2019 at 2:15 PM to discuss the potential use of an new treatment for mild to moderate COVID-19 viral infection in non-hospitalized patients.  This patient is a 43 y.o. female that meets the FDA criteria for Emergency Use Authorization of bamlanivimab or casirivimab\imdevimab.  Has a (+) direct SARS-CoV-2 viral test result  Has mild or moderate COVID-19   Is ? 45 years of age and weighs ? 40 kg  Is NOT hospitalized due to COVID-19  Is NOT requiring oxygen therapy or requiring an increase in baseline oxygen flow rate due to COVID-19  Is within 10 days of symptom onset  Has at least one of the high risk factor(s) for progression to severe COVID-19 and/or hospitalization as defined in EUA.  Specific high risk criteria : BMI >/= 35 Patient is managed for the following: Patient Active Problem List   Diagnosis Date Noted  . Major depressive disorder   . Generalized anxiety disorder with panic attacks   . Binge eating disorder 05/08/2018  . Chronic migraine w/o aura w/o status migrainosus, not intractable 05/08/2018  . Rheumatoid arthritis ([place]) 01/27/2018  . OSA (obstructive sleep apnea), could not tolerate CPAP 08/15/2017  . Hip flexor tightness, left 08/04/2017  . Vitamin D deficiency 02/19/2017  . Fatigue 01/28/2017  . Obesity (BMI 30-39.9) 01/28/2017  . Gastroesophageal reflux disease without esophagitis 01/28/2017  . Breast cancer of upper-outer quadrant of left female breast ([place]) 12/11/2015  . Endometriosis 05/12/2014  . History of hysterectomy for benign disease 05/12/2014     I have spoken and communicated the following to the patient or parent/caregiver:  1. FDA has authorized the emergency use of bamlanivimab and casirivimab\imdevimab for the treatment of mild to moderate COVID-19 in adults and pediatric patients with positive results of direct SARS-CoV-2 viral testing who are 91 years of age and older weighing at least  40 kg, and who are at high risk for progressing to severe COVID-19 and/or hospitalization.  2. The significant known and potential risks and benefits of bamlanivimab and casirivimab\imdevimab, and the extent to which such potential risks and benefits are unknown.  3. Information on available alternative treatments and the risks and benefits of those alternatives, including clinical trials.  4. Patients treated with bamlanivimab and casirivimab\imdevimab should continue to self-isolate and use infection control measures (e.g., wear mask, isolate, social distance, avoid sharing personal items, clean and disinfect "high touch" surfaces, and frequent handwashing) according to CDC guidelines.   5. The patient or parent/caregiver has the option to accept or refuse bamlanivimab or casirivimab\imdevimab .  After reviewing this information with the patient, The patient agreed to proceed with receiving the bamlanimivab infusion and will be provided a copy of the Fact sheet prior to receiving the infusion.Fenton Foy 06/20/2019 2:15 PM
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
## 2019-06-21 ENCOUNTER — Ambulatory Visit: Payer: Self-pay | Admitting: Psychology

## 2019-06-22 ENCOUNTER — Ambulatory Visit (HOSPITAL_COMMUNITY)
Admission: RE | Admit: 2019-06-22 | Discharge: 2019-06-22 | Disposition: A | Discharge status: Home / Self Care | Payer: BLUE CROSS/BLUE SHIELD | Source: Ambulatory Visit | Attending: Critical Care Medicine | Admitting: Critical Care Medicine

## 2019-06-22 ENCOUNTER — Encounter (HOSPITAL_COMMUNITY): Payer: Self-pay

## 2019-06-22 DIAGNOSIS — E669 Obesity, unspecified: Secondary | ICD-10-CM | POA: Insufficient documentation

## 2019-06-22 DIAGNOSIS — U071 COVID-19: Secondary | ICD-10-CM | POA: Insufficient documentation

## 2019-06-22 MED ORDER — METHYLPREDNISOLONE SODIUM SUCC 125 MG IJ SOLR: 125.0000 mg | Freq: Once | INTRAMUSCULAR | Status: DC | PRN

## 2019-06-22 MED ORDER — SODIUM CHLORIDE 0.9 % IV SOLN
700.0000 mg | Freq: Once | INTRAVENOUS | Status: AC
  Administered 2019-06-22: 700 mg via INTRAVENOUS
  Filled 2019-06-22: qty 20

## 2019-06-22 MED ORDER — ALBUTEROL SULFATE HFA 108 (90 BASE) MCG/ACT IN AERS: 2.0000 | INHALATION_SPRAY | Freq: Once | RESPIRATORY_TRACT | Status: DC | PRN

## 2019-06-22 MED ORDER — EPINEPHRINE 0.3 MG/0.3ML IJ SOAJ: 0.3000 mg | Freq: Once | INTRAMUSCULAR | Status: DC | PRN

## 2019-06-22 MED ORDER — FAMOTIDINE IN NACL 20-0.9 MG/50ML-% IV SOLN: 20.0000 mg | Freq: Once | INTRAVENOUS | Status: DC | PRN

## 2019-06-22 MED ORDER — DIPHENHYDRAMINE HCL 50 MG/ML IJ SOLN: 50.0000 mg | Freq: Once | INTRAMUSCULAR | Status: DC | PRN

## 2019-06-22 MED ORDER — SODIUM CHLORIDE 0.9 % IV SOLN
INTRAVENOUS | Status: DC | PRN
  Administered 2019-06-22: 250 mL via INTRAVENOUS

## 2019-06-22 NOTE — Progress Notes (Signed)
  Diagnosis: COVID-19  Physician: Dr. Joya Gaskins  Procedure: [facility] Med: bamlanivimab infusion - Provided patient with bamlanimivab fact sheet for patients, parents and caregivers prior to infusion.  Complications: No immediate complications noted.  Discharge: Discharged home   [name], [name] 06/22/2019

## 2019-06-22 NOTE — Discharge Instructions (Signed)
Prevent the Spread of COVID-19 if You Are Sick If you are sick with COVID-19 or think you might have COVID-19, follow the steps below to help protect other people in your home and community. Stay home except to get medical care.  Stay home. Most people with COVID-19 have mild illness and are able to recover at home without medical care. Do not leave your home, except to get medical care. Do not visit public areas.  Take care of yourself. Get rest and stay hydrated.  Get medical care when needed. Call your doctor before you go to their office for care. But, if you have trouble breathing or other concerning symptoms, call 911 for immediate help.  Avoid public transportation, ride-sharing, or taxis. Separate yourself from other people and pets in your home.  As much as possible, stay in a specific room and away from other people and pets in your home. Also, you should use a separate bathroom, if available. If you need to be around other people or animals in or outside of the home, wear a cloth face covering. ? See COVID-19 and Animals if you have questions about pets: https://www.cdc.gov/coronavirus/2019-ncov/faq.html#COVID19animals Monitor your symptoms.  Common symptoms of COVID-19 include fever and cough. Trouble breathing is a more serious symptom that means you should get medical attention.  Follow care instructions from your healthcare provider and local health department. Your local health authorities will give instructions on checking your symptoms and reporting information. If you develop emergency warning signs for COVID-19 get medical attention immediately.  Emergency warning signs include*:  Trouble breathing  Persistent pain or pressure in the chest  New confusion or not able to be woken  Bluish lips or face *This list is not all inclusive. Please consult your medical provider for any other symptoms that are severe or concerning to you. Call 911 if you have a medical  emergency. If you have a medical emergency and need to call 911, notify the operator that you have or think you might have, COVID-19. If possible, put on a facemask before medical help arrives. Call ahead before visiting your doctor.  Call ahead. Many medical visits for routine care are being postponed or done by phone or telemedicine.  If you have a medical appointment that cannot be postponed, call your doctor's office. This will help the office protect themselves and other patients. If you are sick, wear a cloth covering over your nose and mouth.  You should wear a cloth face covering over your nose and mouth if you must be around other people or animals, including pets (even at home).  You don't need to wear the cloth face covering if you are alone. If you can't put on a cloth face covering (because of trouble breathing for example), cover your coughs and sneezes in some other way. Try to stay at least 6 feet away from other people. This will help protect the people around you. Note: During the COVID-19 pandemic, medical grade facemasks are reserved for healthcare workers and some first responders. You may need to make a cloth face covering using a scarf or bandana. Cover your coughs and sneezes.  Cover your mouth and nose with a tissue when you cough or sneeze.  Throw used tissues in a lined trash can.  Immediately wash your hands with soap and water for at least 20 seconds. If soap and water are not available, clean your hands with an alcohol-based hand sanitizer that contains at least 60% alcohol. Clean your hands often.    Wash your hands often with soap and water for at least 20 seconds. This is especially important after blowing your nose, coughing, or sneezing; going to the bathroom; and before eating or preparing food.  Use hand sanitizer if soap and water are not available. Use an alcohol-based hand sanitizer with at least 60% alcohol, covering all surfaces of your hands and rubbing  them together until they feel dry.  Soap and water are the best option, especially if your hands are visibly dirty.  Avoid touching your eyes, nose, and mouth with unwashed hands. Avoid sharing personal household items.  Do not share dishes, drinking glasses, cups, eating utensils, towels, or bedding with other people in your home.  Wash these items thoroughly after using them with soap and water or put them in the dishwasher. Clean all "high-touch" surfaces everyday.  Clean and disinfect high-touch surfaces in your "sick room" and bathroom. Let someone else clean and disinfect surfaces in common areas, but not your bedroom and bathroom.  If a caregiver or other person needs to clean and disinfect a sick person's bedroom or bathroom, they should do so on an as-needed basis. The caregiver/other person should wear a mask and wait as long as possible after the sick person has used the bathroom. High-touch surfaces include phones, remote controls, counters, tabletops, doorknobs, bathroom fixtures, toilets, keyboards, tablets, and bedside tables.  Clean and disinfect areas that may have blood, stool, or body fluids on them.  Use household cleaners and disinfectants. Clean the area or item with soap and water or another detergent if it is dirty. Then use a household disinfectant. ? Be sure to follow the instructions on the label to ensure safe and effective use of the product. Many products recommend keeping the surface wet for several minutes to ensure germs are killed. Many also recommend precautions such as wearing gloves and making sure you have good ventilation during use of the product. ? Most EPA-registered household disinfectants should be effective. How to discontinue home isolation  People with COVID-19 who have stayed home (home isolated) can stop home isolation under the following conditions: ? If you will not have a test to determine if you are still contagious, you can leave home  after these three things have happened:  You have had no fever for at least 72 hours (that is three full days of no fever without the use of medicine that reduces fevers) AND  other symptoms have improved (for example, when your cough or shortness of breath has improved) AND  at least 10 days have passed since your symptoms first appeared. ? If you will be tested to determine if you are still contagious, you can leave home after these three things have happened:  You no longer have a fever (without the use of medicine that reduces fevers) AND  other symptoms have improved (for example, when your cough or shortness of breath has improved) AND  you received two negative tests in a row, 24 hours apart. Your doctor will follow CDC guidelines. In all cases, follow the guidance of your healthcare provider and local health department. The decision to stop home isolation should be made in consultation with your healthcare provider and state and local health departments. Local decisions depend on local circumstances. cdc.gov/coronavirus 11/07/2018 This information is not intended to replace advice given to you by your health care provider. Make sure you discuss any questions you have with your health care provider. Document Released: 10/19/2018 Document Revised: 11/17/2018 Document Reviewed: 10/19/2018   Elsevier Patient Education  2020 [employer]  

## 2019-06-22 NOTE — Progress Notes (Signed)
  Diagnosis: COVID-19  Physician: Dr. Bryan Lemma  Procedure: [facility] Med: bamlanivimab infusion - Provided patient with bamlanimivab fact sheet for patients, parents and caregivers prior to infusion.  Complications: No immediate complications noted.  Discharge: Discharged home   [name] 06/22/2019

## 2019-06-23 ENCOUNTER — Ambulatory Visit: Payer: Self-pay | Admitting: Oncology

## 2019-06-23 ENCOUNTER — Other Ambulatory Visit: Payer: Self-pay

## 2019-07-06 ENCOUNTER — Encounter: Payer: Self-pay | Admitting: Family Medicine

## 2019-07-06 ENCOUNTER — Telehealth: Payer: Self-pay | Admitting: Family Medicine

## 2019-07-06 DIAGNOSIS — M069 Rheumatoid arthritis, unspecified: Secondary | ICD-10-CM

## 2019-07-07 ENCOUNTER — Telehealth: Payer: Self-pay | Admitting: Family Medicine

## 2019-07-07 MED ORDER — OXYCODONE-ACETAMINOPHEN 5-325 MG PO TABS: 1.0000 | ORAL_TABLET | Freq: Every day | ORAL | 0 refills | Status: DC | PRN

## 2019-07-07 NOTE — Telephone Encounter (Signed)
Copied from [place] [id number]. Topic: General - Other >> Jul 07, 2019 11:28 AM Keene Breath wrote: Reason for CRM: Called to get some clarification on patient's medication for oxyCODONE-acetaminophen (PERCOCET) 5-325 MG tablet.  CB# [id number]

## 2019-07-12 ENCOUNTER — Ambulatory Visit: Payer: BLUE CROSS/BLUE SHIELD | Admitting: Family Medicine

## 2019-07-13 ENCOUNTER — Encounter: Payer: Self-pay | Admitting: Family Medicine

## 2019-07-13 ENCOUNTER — Telehealth (INDEPENDENT_AMBULATORY_CARE_PROVIDER_SITE_OTHER): Payer: Self-pay | Admitting: Family Medicine

## 2019-07-13 VITALS — Ht 64.0 in | Wt 210.0 lb

## 2019-07-13 DIAGNOSIS — M2550 Pain in unspecified joint: Secondary | ICD-10-CM

## 2019-07-13 DIAGNOSIS — Z8616 Personal history of COVID-19: Secondary | ICD-10-CM

## 2019-07-13 MED ORDER — PREDNISONE 10 MG PO TABS: 5.0000 mg | ORAL_TABLET | Freq: Every day | ORAL | 3 refills | Status: DC
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
## 2019-07-15 ENCOUNTER — Ambulatory Visit (INDEPENDENT_AMBULATORY_CARE_PROVIDER_SITE_OTHER): Payer: Self-pay | Admitting: Psychology

## 2019-07-15 DIAGNOSIS — F321 Major depressive disorder, single episode, moderate: Secondary | ICD-10-CM

## 2019-07-28 ENCOUNTER — Ambulatory Visit: Payer: BLUE CROSS/BLUE SHIELD | Admitting: Family Medicine

## 2019-07-28 NOTE — Progress Notes (Deleted)
Virtual Visit via Video Note  I connected with [name] on 07/28/19 at  2:30 PM EST by a video enabled telemedicine application and verified that I am speaking with the correct person using two identifiers.  Location: Patient: *** Provider: ***   I discussed the limitations of evaluation and management by telemedicine and the availability of in person appointments. The patient expressed understanding and agreed to proceed.  History of Present Illness:    Observations/Objective:   Assessment and Plan:   Follow Up Instructions:    I discussed the assessment and treatment plan with the patient. The patient was provided an opportunity to ask questions and all were answered. The patient agreed with the plan and demonstrated an understanding of the instructions.   The patient was advised to call back or seek an in-person evaluation if the symptoms worsen or if the condition fails to improve as anticipated.  I provided *** minutes of non-face-to-face time during this encounter.   Lyndal Pulley, DO

## 2019-07-29 ENCOUNTER — Other Ambulatory Visit: Payer: Self-pay | Admitting: Family Medicine

## 2019-08-02 ENCOUNTER — Ambulatory Visit (INDEPENDENT_AMBULATORY_CARE_PROVIDER_SITE_OTHER): Payer: Self-pay | Admitting: Family Medicine

## 2019-08-02 ENCOUNTER — Encounter: Payer: Self-pay | Admitting: Family Medicine

## 2019-08-02 DIAGNOSIS — K219 Gastro-esophageal reflux disease without esophagitis: Secondary | ICD-10-CM

## 2019-08-02 DIAGNOSIS — R1012 Left upper quadrant pain: Secondary | ICD-10-CM

## 2019-08-02 MED ORDER — PREDNISONE 50 MG PO TABS: 50.0000 mg | ORAL_TABLET | Freq: Every day | ORAL | 0 refills | Status: DC

## 2019-08-02 MED ORDER — OMEPRAZOLE 40 MG PO CPDR: 40.0000 mg | DELAYED_RELEASE_CAPSULE | Freq: Two times a day (BID) | ORAL | 11 refills | Status: DC

## 2019-08-02 NOTE — Progress Notes (Signed)
Virtual Visit via Video Note  I connected with [name] on 08/02/19 at  8:45 AM EST by a video enabled telemedicine application and verified that I am speaking with the correct person using two identifiers.  Location: Patient: In home setting alone Provider: In office setting   I discussed the limitations of evaluation and management by telemedicine and the availability of in person appointments. The patient expressed understanding and agreed to proceed.  History of Present Illness: Patient is a 44 year old female who has had more of a chronic side pain.  Was seen initially by me in the office and seemed to be more epigastric pain.  Started on Prilosec and was doing better.  Unfortunately since we have seen her was positive for Covid.  Since then has had significant difficulty with some mild shortness of breath and continues to now have worsening pain in the stomach.  Patient states that even eating causes some increasing discomfort in the area.  Wanting to know what else she can do.  No longer taking the Prilosec that was helping previously.    Observations/Objective: Alert and oriented, laying in bed.  Does seem uncomfortable.  Patient though denies any rebound tenderness   Assessment and Plan: 44 year old female who saw me initially for side pain that likely has more of reflex or possible gastric ulcer which would be a chronic illness with exacerbation.  Patient also recently had Covid and we have seen increased risk of post viral inflammation of the lungs that could be contributing as well prednisone and Prilosec given today and warned of red flags and when to seek medical attention if abdominal pain changes or bowel regimen changes.  I would like to refer patient to gastroenterology for further evaluation with the possibility of EGD and colonoscopy which patient is in agreement if she can figure it out and feel better.  Patient will send a message to me in 5 days to make sure she is  improving   Follow Up Instructions: My chart message in 5 days    I discussed the assessment and treatment plan with the patient. The patient was provided an opportunity to ask questions and all were answered. The patient agreed with the plan and demonstrated an understanding of the instructions.   The patient was advised to call back or seek an in-person evaluation if the symptoms worsen or if the condition fails to improve as anticipated.  Reviewing patient's chart prior to the exam, during the virtual exam and afterwards including most recent office visits with primary care provider and other specialists total time 40 minutes.   Lyndal Pulley, DO

## 2019-08-08 ENCOUNTER — Ambulatory Visit: Payer: Self-pay | Admitting: Psychology

## 2019-08-14 ENCOUNTER — Other Ambulatory Visit: Payer: Self-pay | Admitting: Family Medicine

## 2019-08-14 DIAGNOSIS — F324 Major depressive disorder, single episode, in partial remission: Secondary | ICD-10-CM

## 2019-08-14 DIAGNOSIS — M069 Rheumatoid arthritis, unspecified: Secondary | ICD-10-CM

## 2019-08-14 DIAGNOSIS — M797 Fibromyalgia: Secondary | ICD-10-CM

## 2019-08-15 NOTE — Telephone Encounter (Signed)
Please review

## 2019-08-18 ENCOUNTER — Encounter: Payer: Self-pay | Admitting: Family Medicine

## 2019-08-18 ENCOUNTER — Telehealth (INDEPENDENT_AMBULATORY_CARE_PROVIDER_SITE_OTHER): Payer: Self-pay | Admitting: Family Medicine

## 2019-08-18 VITALS — Ht 64.0 in | Wt 210.0 lb

## 2019-08-18 DIAGNOSIS — M069 Rheumatoid arthritis, unspecified: Secondary | ICD-10-CM

## 2019-08-18 DIAGNOSIS — F5101 Primary insomnia: Secondary | ICD-10-CM

## 2019-08-18 DIAGNOSIS — R5383 Other fatigue: Secondary | ICD-10-CM

## 2019-08-18 MED ORDER — ZOLPIDEM TARTRATE 10 MG PO TABS: 10.0000 mg | ORAL_TABLET | Freq: Every evening | ORAL | 0 refills | Status: DC | PRN

## 2019-08-18 MED ORDER — LISDEXAMFETAMINE DIMESYLATE 50 MG PO CAPS: 50.0000 mg | ORAL_CAPSULE | Freq: Every day | ORAL | 0 refills | Status: DC

## 2019-08-18 MED ORDER — OXYCODONE-ACETAMINOPHEN 5-325 MG PO TABS: 1.0000 | ORAL_TABLET | Freq: Every day | ORAL | 0 refills | Status: DC | PRN
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
## 2019-08-18 NOTE — Patient Instructions (Addendum)
Vit B12 1000mg  daily  Probiotic - Align Acidophilus, lactobacillus

## 2019-08-24 ENCOUNTER — Encounter: Payer: Self-pay | Admitting: Neurology

## 2019-08-24 NOTE — Telephone Encounter (Signed)
[place]: [id number] - PA Case ID: [id number] - Rx #: [id number] Outcome Approved today PA Case: [id number], Status: Approved, Coverage Starts on: 08/24/2019 12:00:00 AM, Coverage Ends on: 11/22/2019 12:00:00 AM. Drug oxyCODONE-Acetaminophen 5-325MG  tablets Form [employer] 4-Part NCPDP Electronic PA Form

## 2019-08-24 NOTE — Progress Notes (Signed)
Patient not seen.  Did not respond to text link or phone call.

## 2019-08-25 ENCOUNTER — Telehealth (INDEPENDENT_AMBULATORY_CARE_PROVIDER_SITE_OTHER): Payer: BLUE CROSS/BLUE SHIELD | Admitting: Neurology

## 2019-08-25 ENCOUNTER — Other Ambulatory Visit: Payer: Self-pay

## 2019-08-25 ENCOUNTER — Encounter: Payer: Self-pay | Admitting: Neurology

## 2019-08-25 DIAGNOSIS — Z5329 Procedure and treatment not carried out because of patient's decision for other reasons: Secondary | ICD-10-CM

## 2019-09-01 ENCOUNTER — Telehealth: Payer: Self-pay | Admitting: Oncology

## 2019-09-01 ENCOUNTER — Encounter: Payer: Self-pay | Admitting: Family Medicine

## 2019-09-01 NOTE — Telephone Encounter (Signed)
Scheduled appt per 2/25 sch message - unable to reach patient.left message with appt date and time

## 2019-09-05 ENCOUNTER — Encounter: Payer: Self-pay | Admitting: *Deleted
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
## 2019-09-06 ENCOUNTER — Encounter: Payer: Self-pay | Admitting: Gastroenterology

## 2019-09-06 ENCOUNTER — Other Ambulatory Visit: Payer: Self-pay

## 2019-09-06 ENCOUNTER — Encounter: Payer: Self-pay | Admitting: Neurology

## 2019-09-06 ENCOUNTER — Other Ambulatory Visit: Payer: 59

## 2019-09-06 ENCOUNTER — Telehealth (INDEPENDENT_AMBULATORY_CARE_PROVIDER_SITE_OTHER): Payer: 59 | Admitting: Neurology

## 2019-09-06 ENCOUNTER — Ambulatory Visit (INDEPENDENT_AMBULATORY_CARE_PROVIDER_SITE_OTHER): Payer: 59 | Admitting: Gastroenterology

## 2019-09-06 VITALS — BP 124/82 | HR 92 | Temp 96.1°F | Ht 64.0 in | Wt 218.0 lb

## 2019-09-06 DIAGNOSIS — Z01818 Encounter for other preprocedural examination: Secondary | ICD-10-CM | POA: Diagnosis not present

## 2019-09-06 DIAGNOSIS — R1013 Epigastric pain: Secondary | ICD-10-CM | POA: Diagnosis not present

## 2019-09-06 DIAGNOSIS — F329 Major depressive disorder, single episode, unspecified: Secondary | ICD-10-CM

## 2019-09-06 DIAGNOSIS — R197 Diarrhea, unspecified: Secondary | ICD-10-CM

## 2019-09-06 DIAGNOSIS — G3184 Mild cognitive impairment, so stated: Secondary | ICD-10-CM

## 2019-09-06 DIAGNOSIS — F419 Anxiety disorder, unspecified: Secondary | ICD-10-CM

## 2019-09-06 DIAGNOSIS — G43709 Chronic migraine without aura, not intractable, without status migrainosus: Secondary | ICD-10-CM | POA: Diagnosis not present

## 2019-09-06 MED ORDER — LOPERAMIDE HCL 2 MG PO TABS: ORAL_TABLET | ORAL | 0 refills | Status: DC

## 2019-09-06 MED ORDER — ELETRIPTAN HYDROBROMIDE 40 MG PO TABS: ORAL_TABLET | ORAL | 0 refills | Status: DC

## 2019-09-06 MED ORDER — PROPRANOLOL HCL ER 120 MG PO CP24: 120.0000 mg | ORAL_CAPSULE | Freq: Every day | ORAL | 3 refills | Status: DC

## 2019-09-06 NOTE — Patient Instructions (Signed)
If you are age 46 or older, your body mass index should be between 23-30. Your Body mass index is 37.42 kg/m. If this is out of the aforementioned range listed, please consider follow up with your Primary Care Provider.  If you are age 10 or younger, your body mass index should be between 19-25. Your Body mass index is 37.42 kg/m. If this is out of the aformentioned range listed, please consider follow up with your Primary Care Provider.   Your provider has requested that you go to the basement level for lab work before leaving today. Press "B" on the elevator. The lab is located at the first door on the left as you exit the elevator.  You have been scheduled for an endoscopy. Please follow written instructions given to you at your visit today. If you use inhalers (even only as needed), please bring them with you on the day of your procedure.  Please purchase the following medications over the counter and take as directed:  START: Imodium (over-the-counter) take 1 tablet shortly after waking up each day.  Due to recent changes in healthcare laws, you may see the results of your imaging and laboratory studies on MyChart before your provider has had a chance to review them.  We understand that in some cases there may be results that are confusing or concerning to you. Not all laboratory results come back in the same time frame and the provider may be waiting for multiple results in order to interpret others.  Please give [place] 48 hours in order for your provider to thoroughly review all the results before contacting the office for clarification of your results.   Thank you, Dr Ardis Hughs
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
## 2019-09-07 ENCOUNTER — Encounter: Payer: Self-pay | Admitting: Adult Health

## 2019-09-07 ENCOUNTER — Inpatient Hospital Stay (HOSPITAL_BASED_OUTPATIENT_CLINIC_OR_DEPARTMENT_OTHER): Payer: 59 | Admitting: Adult Health

## 2019-09-07 ENCOUNTER — Inpatient Hospital Stay: Payer: 59 | Attending: Adult Health

## 2019-09-07 ENCOUNTER — Inpatient Hospital Stay: Payer: Self-pay | Admitting: Adult Health

## 2019-09-07 ENCOUNTER — Inpatient Hospital Stay: Payer: Self-pay

## 2019-09-07 ENCOUNTER — Other Ambulatory Visit: Payer: Self-pay

## 2019-09-07 ENCOUNTER — Encounter: Payer: Self-pay | Admitting: Family Medicine

## 2019-09-07 VITALS — BP 134/76 | HR 82 | Temp 98.0°F | Resp 18 | Ht 64.0 in | Wt 215.9 lb

## 2019-09-07 DIAGNOSIS — Z90710 Acquired absence of both cervix and uterus: Secondary | ICD-10-CM | POA: Insufficient documentation

## 2019-09-07 DIAGNOSIS — F324 Major depressive disorder, single episode, in partial remission: Secondary | ICD-10-CM

## 2019-09-07 DIAGNOSIS — C50412 Malignant neoplasm of upper-outer quadrant of left female breast: Secondary | ICD-10-CM

## 2019-09-07 DIAGNOSIS — G43909 Migraine, unspecified, not intractable, without status migrainosus: Secondary | ICD-10-CM | POA: Insufficient documentation

## 2019-09-07 DIAGNOSIS — Z853 Personal history of malignant neoplasm of breast: Secondary | ICD-10-CM | POA: Diagnosis present

## 2019-09-07 DIAGNOSIS — Z923 Personal history of irradiation: Secondary | ICD-10-CM | POA: Diagnosis not present

## 2019-09-07 DIAGNOSIS — Z170 Estrogen receptor positive status [ER+]: Secondary | ICD-10-CM

## 2019-09-07 DIAGNOSIS — R5383 Other fatigue: Secondary | ICD-10-CM | POA: Insufficient documentation

## 2019-09-07 DIAGNOSIS — M797 Fibromyalgia: Secondary | ICD-10-CM | POA: Diagnosis not present

## 2019-09-07 DIAGNOSIS — M069 Rheumatoid arthritis, unspecified: Secondary | ICD-10-CM

## 2019-09-07 DIAGNOSIS — Z9221 Personal history of antineoplastic chemotherapy: Secondary | ICD-10-CM | POA: Diagnosis not present

## 2019-09-07 LAB — CBC WITH DIFFERENTIAL (CANCER CENTER ONLY)
Abs Immature Granulocytes: 0.08 10*3/uL — ABNORMAL HIGH (ref 0.00–0.07)
Basophils Absolute: 0 10*3/uL (ref 0.0–0.1)
Basophils Relative: 0 %
Eosinophils Absolute: 0.3 10*3/uL (ref 0.0–0.5)
Eosinophils Relative: 2 %
HCT: 37.8 % (ref 36.0–46.0)
Hemoglobin: 12.9 g/dL (ref 12.0–15.0)
Immature Granulocytes: 1 %
Lymphocytes Relative: 24 %
Lymphs Abs: 2.6 10*3/uL (ref 0.7–4.0)
MCH: 30.3 pg (ref 26.0–34.0)
MCHC: 34.1 g/dL (ref 30.0–36.0)
MCV: 88.7 fL (ref 80.0–100.0)
Monocytes Absolute: 0.9 10*3/uL (ref 0.1–1.0)
Monocytes Relative: 8 %
Neutro Abs: 6.9 10*3/uL (ref 1.7–7.7)
Neutrophils Relative %: 65 %
Platelet Count: 344 10*3/uL (ref 150–400)
RBC: 4.26 MIL/uL (ref 3.87–5.11)
RDW: 13.5 % (ref 11.5–15.5)
WBC Count: 10.6 10*3/uL — ABNORMAL HIGH (ref 4.0–10.5)
nRBC: 0 % (ref 0.0–0.2)

## 2019-09-07 LAB — CMP (CANCER CENTER ONLY)
ALT: 20 U/L (ref 0–44)
AST: 14 U/L — ABNORMAL LOW (ref 15–41)
Albumin: 3.9 g/dL (ref 3.5–5.0)
Alkaline Phosphatase: 101 U/L (ref 38–126)
Anion gap: 10 (ref 5–15)
BUN: 14 mg/dL (ref 6–20)
CO2: 25 mmol/L (ref 22–32)
Calcium: 9 mg/dL (ref 8.9–10.3)
Chloride: 105 mmol/L (ref 98–111)
Creatinine: 0.81 mg/dL (ref 0.44–1.00)
GFR, Est AFR Am: >60 mL/min (ref >60)
GFR, Estimated: >60 mL/min (ref >60)
Glucose, Bld: 104 mg/dL — ABNORMAL HIGH (ref 70–99)
Potassium: 3.6 mmol/L (ref 3.5–5.1)
Sodium: 140 mmol/L (ref 135–145)
Total Bilirubin: 0.5 mg/dL (ref 0.3–1.2)
Total Protein: 7.7 g/dL (ref 6.5–8.1)

## 2019-09-07 MED ORDER — VENLAFAXINE HCL ER 150 MG PO CP24: ORAL_CAPSULE | ORAL | 1 refills | Status: DC

## 2019-09-07 NOTE — Patient Instructions (Signed)

## 2019-09-07 NOTE — Telephone Encounter (Signed)
Rx refilled.

## 2019-09-07 NOTE — Telephone Encounter (Signed)
Last OV 08/18/19 Last fill 02/07/19  #90/1
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
## 2019-09-08 ENCOUNTER — Telehealth: Payer: Self-pay | Admitting: Oncology

## 2019-09-08 ENCOUNTER — Other Ambulatory Visit: Payer: 59

## 2019-09-08 DIAGNOSIS — R197 Diarrhea, unspecified: Secondary | ICD-10-CM

## 2019-09-08 NOTE — Telephone Encounter (Signed)
I left a message regarding schedule  

## 2019-09-09 ENCOUNTER — Telehealth: Payer: Self-pay | Admitting: Family Medicine

## 2019-09-09 ENCOUNTER — Encounter: Payer: Self-pay | Admitting: Family Medicine

## 2019-09-09 NOTE — Telephone Encounter (Signed)
Patient is calling and wanted to let Dr. Loletha Grayer know that the pharmacy faxed over  a PA for Vyvanse to bee completed and faxed. CB is [phone]

## 2019-09-09 NOTE — Telephone Encounter (Signed)
Pt also send Mychart message.  PA submitted today.

## 2019-09-12 NOTE — Telephone Encounter (Signed)
[name] Key: [id number] - PA Case ID: [id number] - Rx #: [id number] Need help? Call us at [phone] Outcome Approved today PA Case: [id number], Status: Approved, Coverage Starts on: 09/12/2019 12:00:00 AM, Coverage Ends on: 09/11/2020 12:00:00 AM. Drug Vyvanse 50MG  capsules

## 2019-09-13 LAB — GASTROINTESTINAL PATHOGEN PANEL PCR
C. difficile Tox A/B, PCR: NOT DETECTED
Campylobacter, PCR: NOT DETECTED
Cryptosporidium, PCR: NOT DETECTED
E coli (ETEC) LT/ST PCR: NOT DETECTED
E coli (STEC) stx1/stx2, PCR: NOT DETECTED
E coli 0157, PCR: NOT DETECTED
Giardia lamblia, PCR: NOT DETECTED
Norovirus, PCR: NOT DETECTED
Rotavirus A, PCR: NOT DETECTED
Salmonella, PCR: NOT DETECTED
Shigella, PCR: NOT DETECTED

## 2019-10-01 IMAGING — US ULTRASOUND RIGHT BREAST LIMITED
1 series · 2 of 2 positions shown · non-contrast
Comparison: Previous exam(s).

CLINICAL DATA: Patient reports lumps in the upper outer quadrants
of each breast. Patient has a history left lumpectomy for breast
carcinoma performed in May 2016.

EXAM:
DIGITAL DIAGNOSTIC BILATERAL MAMMOGRAM WITH CAD AND TOMO
ULTRASOUND BILATERAL BREAST

[Series 1: ultrasound right breast limited · 0.07mm/px · 2 of 2 slices shown]
[im 1/2]
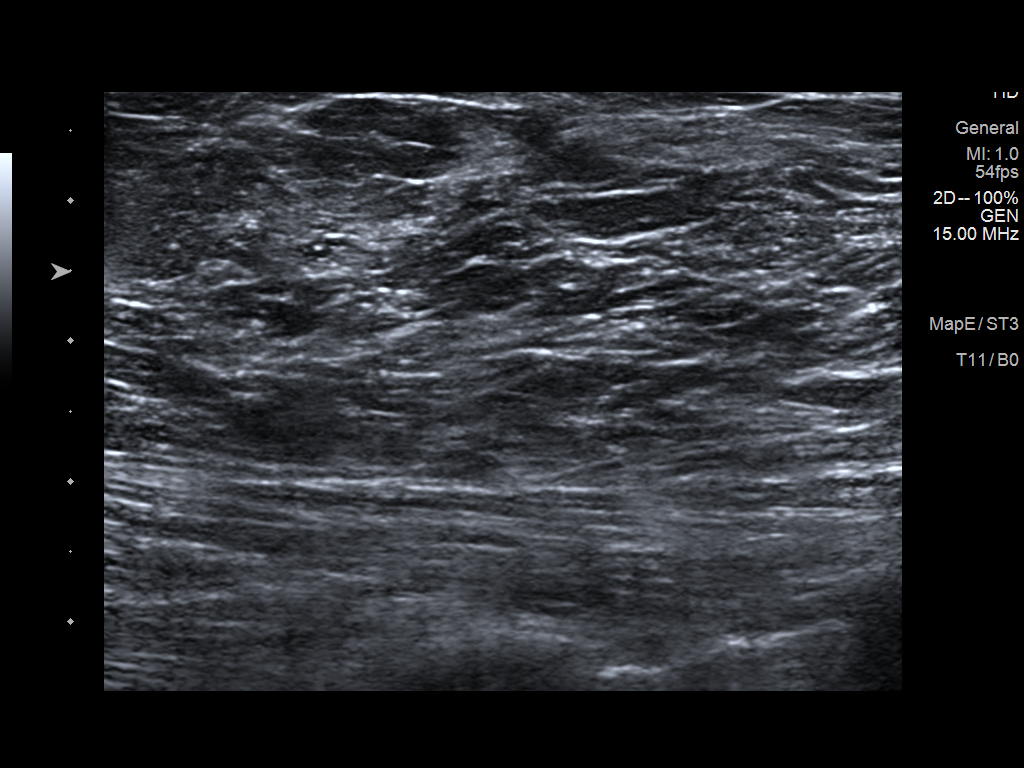
[im 2/2]
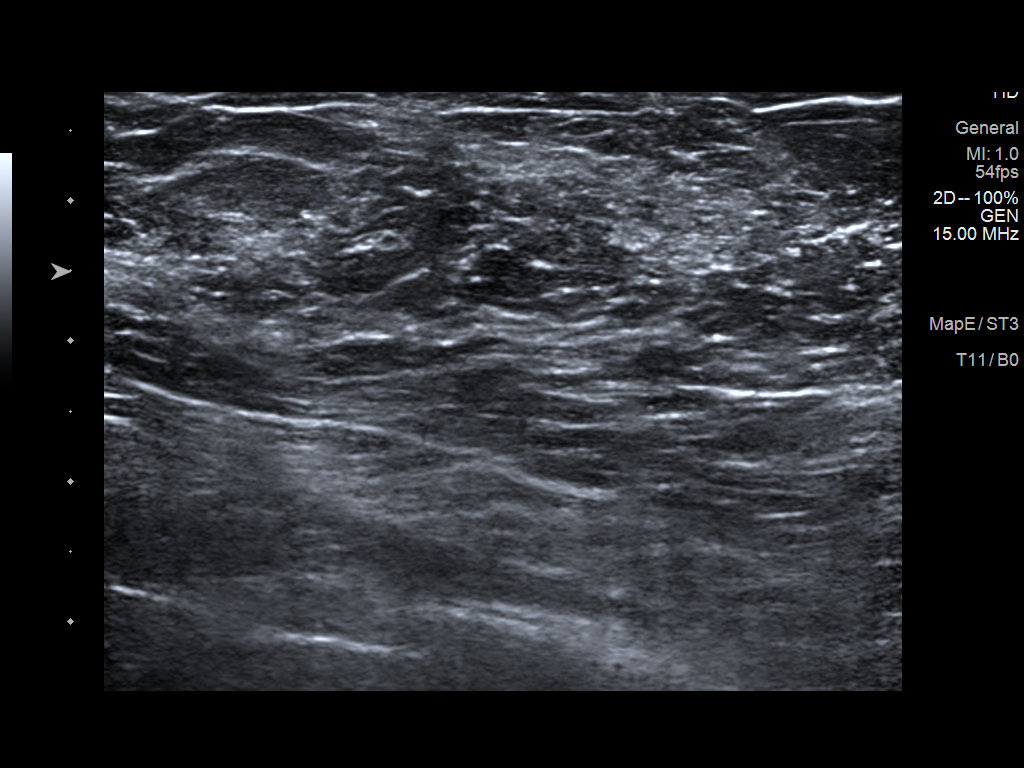

[2 of 2 positions shown; findings below may reference images not displayed]

ACR Breast Density Category b: There are scattered areas of
fibroglandular density.
FINDINGS: There are no discrete masses. Postsurgical architectural distortion
is noted in the posterior, upper outer aspect of the left breast,
stable from the recent exams. There are no areas of nonsurgical
architectural distortion. There are no new or suspicious
calcifications.

Mammographic images were processed with CAD.

On physical exam, no mass is palpated in the upper outer or lateral
aspects of either breast.

Targeted ultrasound is performed, showing normal tissue throughout
the upper outer aspects of each breast. No sonographic correlate to
the reported palpable abnormality. No mass or cyst.
IMPRESSION: 1. No evidence of new or recurrent breast carcinoma.
2. Benign postsurgical changes on the left.

RECOMMENDATION:
Diagnostic mammography in 1 year per standard post lumpectomy
protocol.

I have discussed the findings and recommendations with the patient.
Results were also provided in writing at the conclusion of the
visit. If applicable, a reminder letter will be sent to the patient
regarding the next appointment.

BI-RADS CATEGORY  2: Benign.

## 2019-10-01 IMAGING — MG DIGITAL DIAGNOSTIC BILATERAL MAMMOGRAM WITH TOMO AND CAD
8 of 14 series · 8 of 38 positions shown · non-contrast
Comparison: Previous exam(s).

CLINICAL DATA: Patient reports lumps in the upper outer quadrants
of each breast. Patient has a history left lumpectomy for breast
carcinoma performed in May 2016.

EXAM:
DIGITAL DIAGNOSTIC BILATERAL MAMMOGRAM WITH CAD AND TOMO
ULTRASOUND BILATERAL BREAST

[L MLO (1 of 2)]
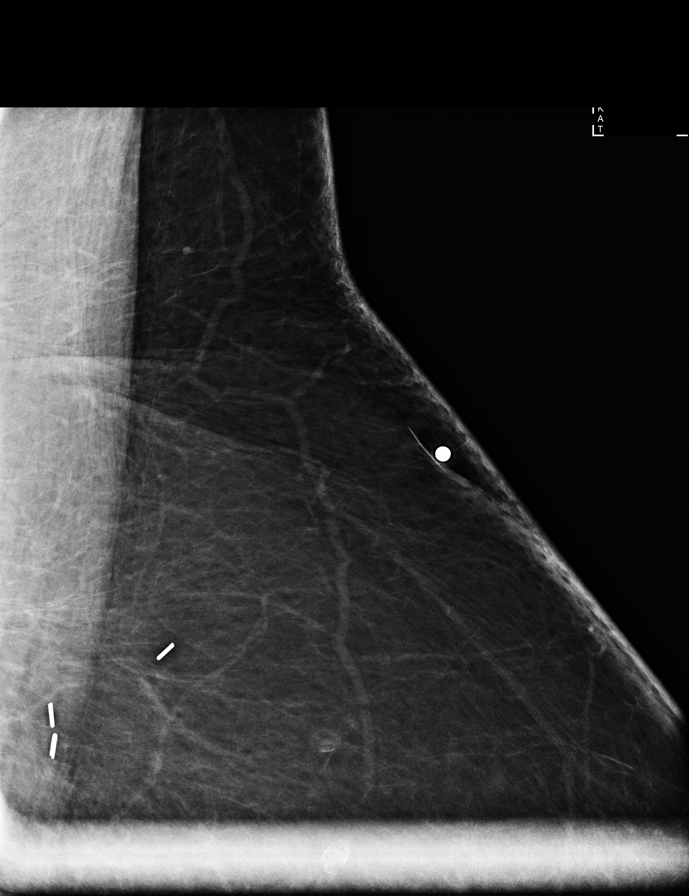

[L MLO (2 of 2)]
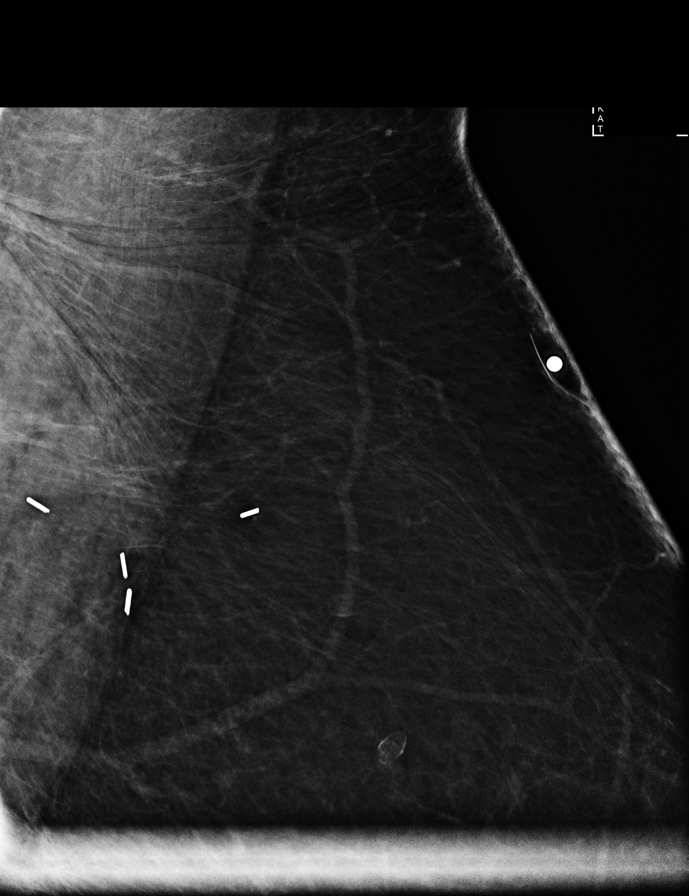

[R TAN synth-2D]
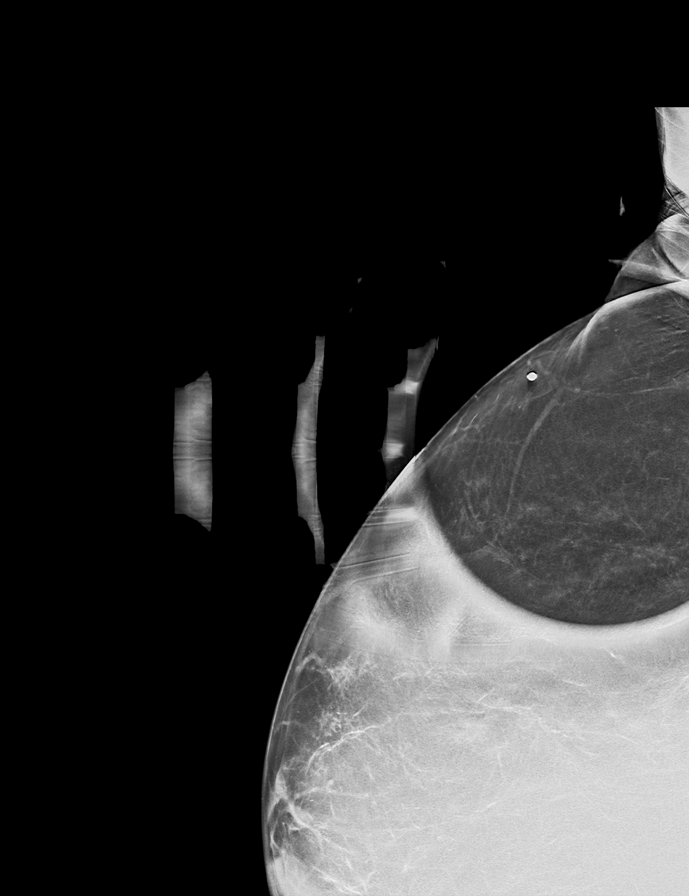

[R MLO synth-2D]
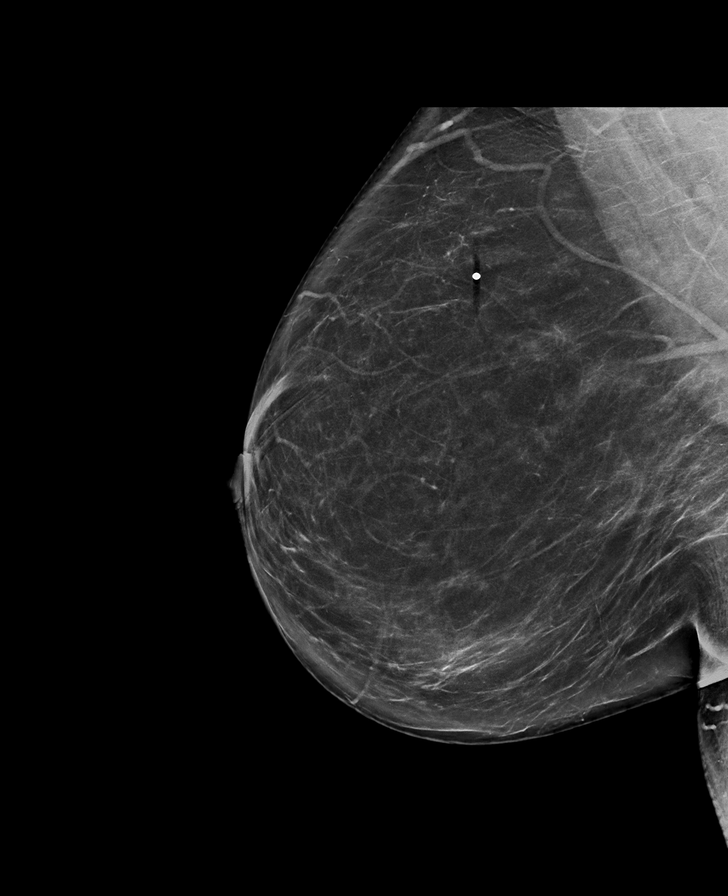

[L CC synth-2D]
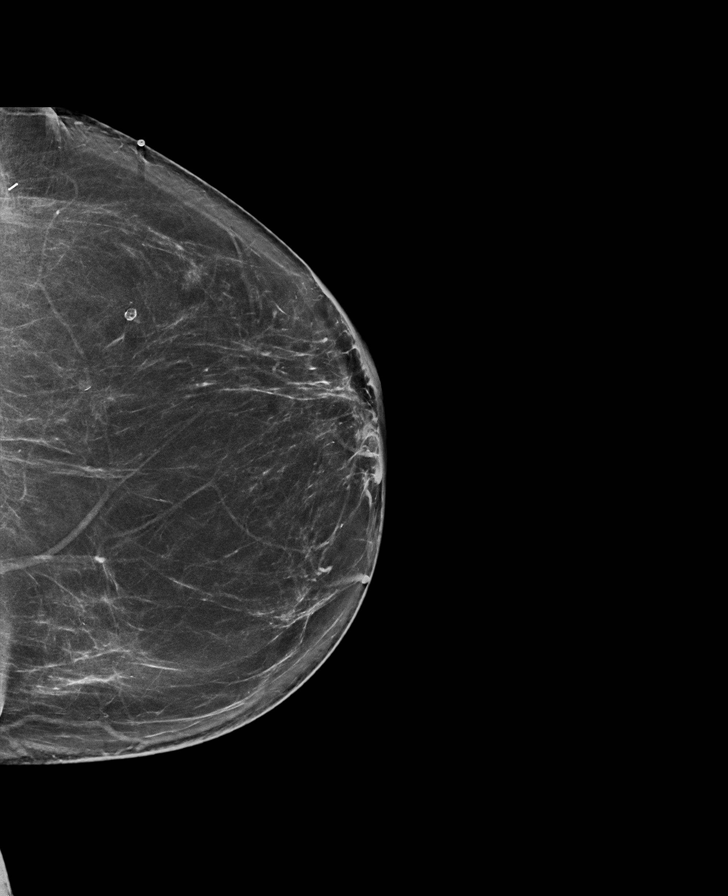

[L TAN synth-2D]
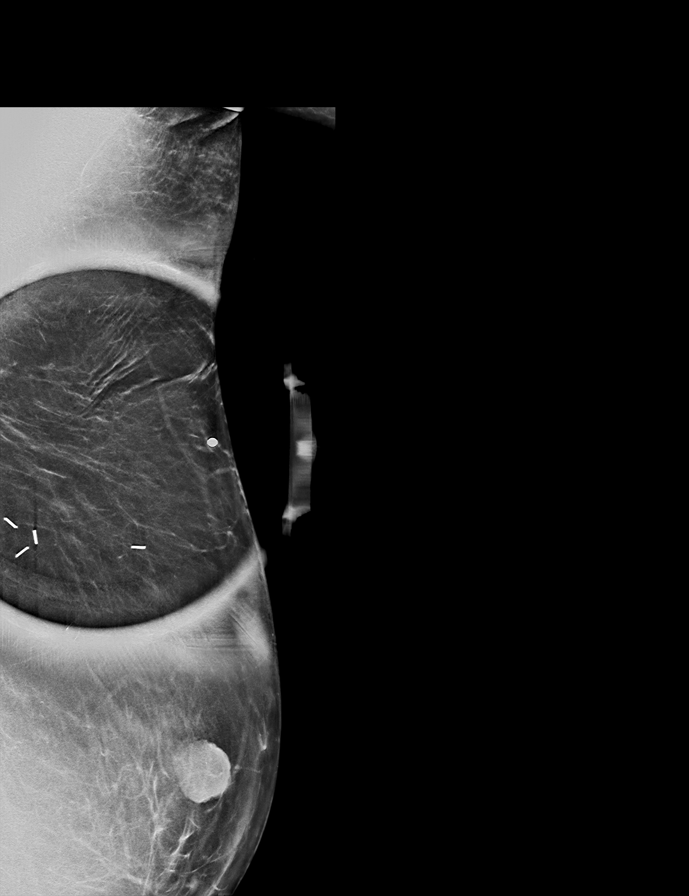

[L MLO synth-2D]
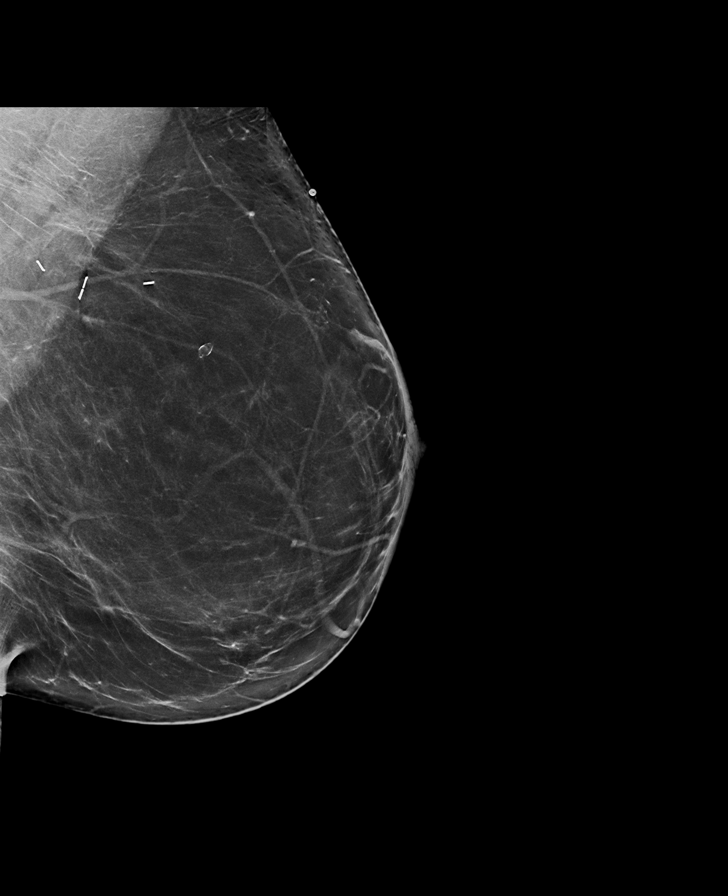

[R CC synth-2D]
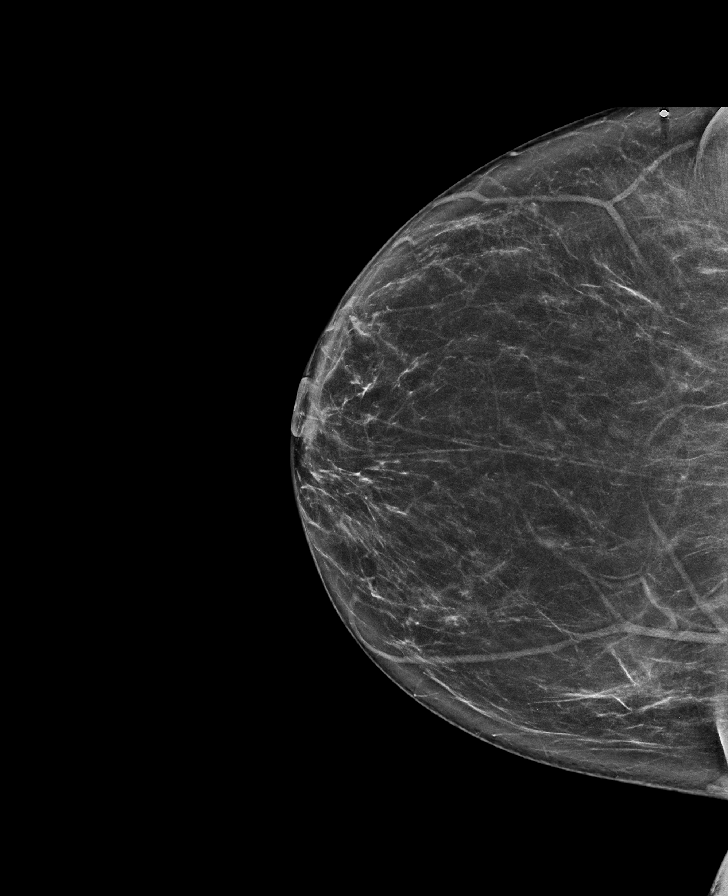

[8 of 38 positions shown; findings below may reference images not displayed]

ACR Breast Density Category b: There are scattered areas of
fibroglandular density.
FINDINGS: There are no discrete masses. Postsurgical architectural distortion
is noted in the posterior, upper outer aspect of the left breast,
stable from the recent exams. There are no areas of nonsurgical
architectural distortion. There are no new or suspicious
calcifications.

Mammographic images were processed with CAD.

On physical exam, no mass is palpated in the upper outer or lateral
aspects of either breast.

Targeted ultrasound is performed, showing normal tissue throughout
the upper outer aspects of each breast. No sonographic correlate to
the reported palpable abnormality. No mass or cyst.
IMPRESSION: 1. No evidence of new or recurrent breast carcinoma.
2. Benign postsurgical changes on the left.

RECOMMENDATION:
Diagnostic mammography in 1 year per standard post lumpectomy
protocol.

I have discussed the findings and recommendations with the patient.
Results were also provided in writing at the conclusion of the
visit. If applicable, a reminder letter will be sent to the patient
regarding the next appointment.

BI-RADS CATEGORY  2: Benign.

## 2019-10-01 IMAGING — US ULTRASOUND LEFT BREAST LIMITED
1 series · 2 of 2 positions shown · non-contrast
Comparison: Previous exam(s).

CLINICAL DATA: Patient reports lumps in the upper outer quadrants
of each breast. Patient has a history left lumpectomy for breast
carcinoma performed in May 2016.

EXAM:
DIGITAL DIAGNOSTIC BILATERAL MAMMOGRAM WITH CAD AND TOMO
ULTRASOUND BILATERAL BREAST

[Series 1: ultrasound left breast limited · 0.07mm/px · 2 of 2 slices shown]
[im 1/2]
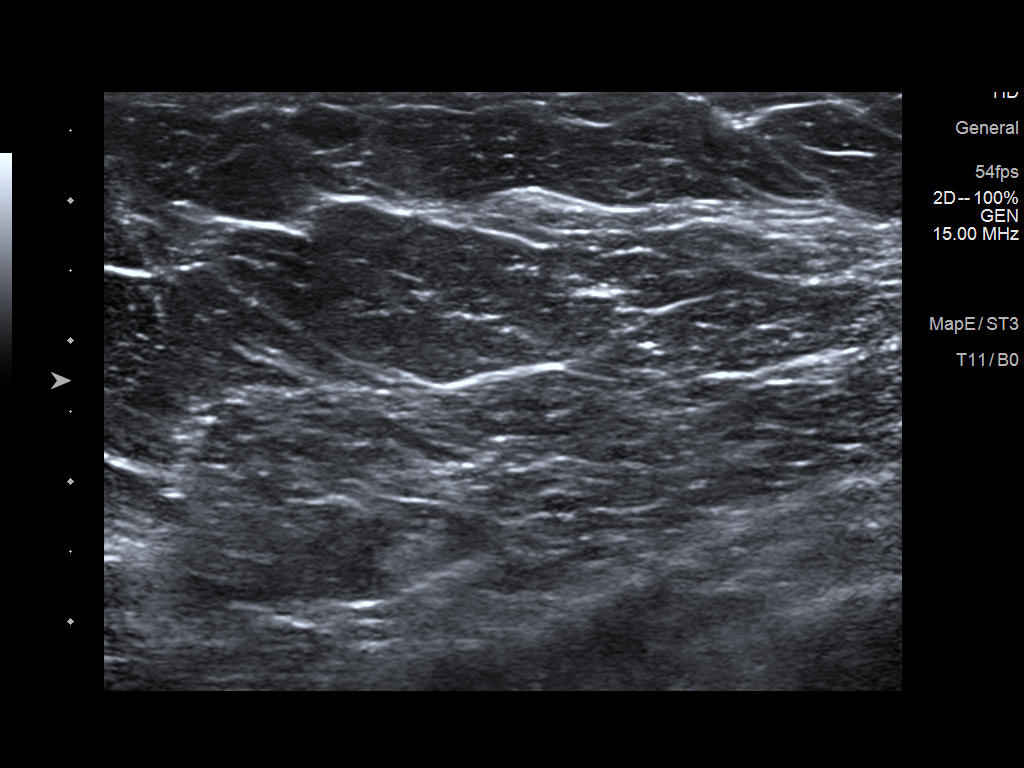
[im 2/2]
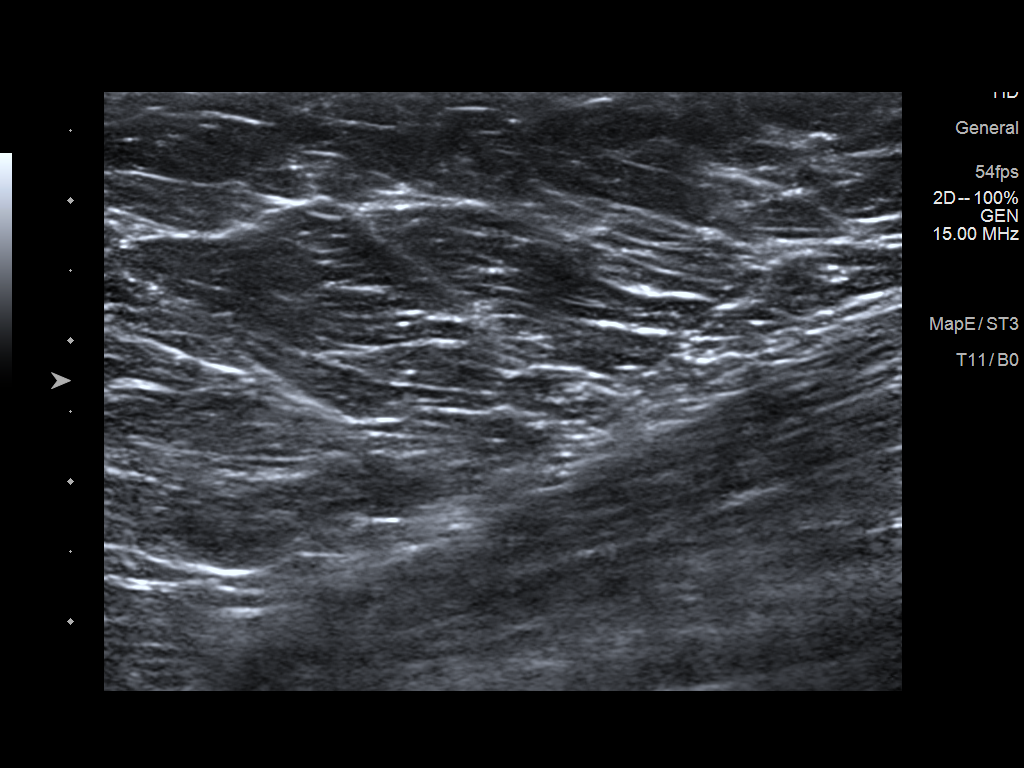

[2 of 2 positions shown; findings below may reference images not displayed]

ACR Breast Density Category b: There are scattered areas of
fibroglandular density.
FINDINGS: There are no discrete masses. Postsurgical architectural distortion
is noted in the posterior, upper outer aspect of the left breast,
stable from the recent exams. There are no areas of nonsurgical
architectural distortion. There are no new or suspicious
calcifications.

Mammographic images were processed with CAD.

On physical exam, no mass is palpated in the upper outer or lateral
aspects of either breast.

Targeted ultrasound is performed, showing normal tissue throughout
the upper outer aspects of each breast. No sonographic correlate to
the reported palpable abnormality. No mass or cyst.
IMPRESSION: 1. No evidence of new or recurrent breast carcinoma.
2. Benign postsurgical changes on the left.

RECOMMENDATION:
Diagnostic mammography in 1 year per standard post lumpectomy
protocol.

I have discussed the findings and recommendations with the patient.
Results were also provided in writing at the conclusion of the
visit. If applicable, a reminder letter will be sent to the patient
regarding the next appointment.

BI-RADS CATEGORY  2: Benign.

## 2019-10-05 ENCOUNTER — Encounter (INDEPENDENT_AMBULATORY_CARE_PROVIDER_SITE_OTHER): Payer: Self-pay | Admitting: Family Medicine

## 2019-10-05 NOTE — Telephone Encounter (Signed)
URGENT

## 2019-10-07 ENCOUNTER — Ambulatory Visit (INDEPENDENT_AMBULATORY_CARE_PROVIDER_SITE_OTHER): Payer: 59

## 2019-10-07 ENCOUNTER — Other Ambulatory Visit: Payer: Self-pay | Admitting: Gastroenterology

## 2019-10-07 DIAGNOSIS — Z1159 Encounter for screening for other viral diseases: Secondary | ICD-10-CM

## 2019-10-08 LAB — SARS CORONAVIRUS 2 (TAT 6-24 HRS): SARS Coronavirus 2: NEGATIVE

## 2019-10-11 ENCOUNTER — Ambulatory Visit (AMBULATORY_SURGERY_CENTER): Payer: 59 | Admitting: Gastroenterology

## 2019-10-11 ENCOUNTER — Other Ambulatory Visit: Payer: Self-pay

## 2019-10-11 ENCOUNTER — Encounter: Payer: Self-pay | Admitting: Gastroenterology

## 2019-10-11 VITALS — BP 157/86 | HR 81 | Temp 98.2°F | Resp 12 | Ht 64.0 in | Wt 218.0 lb

## 2019-10-11 DIAGNOSIS — K2950 Unspecified chronic gastritis without bleeding: Secondary | ICD-10-CM | POA: Diagnosis not present

## 2019-10-11 DIAGNOSIS — B9681 Helicobacter pylori [H. pylori] as the cause of diseases classified elsewhere: Secondary | ICD-10-CM

## 2019-10-11 DIAGNOSIS — K2100 Gastro-esophageal reflux disease with esophagitis, without bleeding: Secondary | ICD-10-CM

## 2019-10-11 DIAGNOSIS — R1013 Epigastric pain: Secondary | ICD-10-CM

## 2019-10-11 DIAGNOSIS — K3189 Other diseases of stomach and duodenum: Secondary | ICD-10-CM

## 2019-10-11 MED ORDER — SODIUM CHLORIDE 0.9 % IV SOLN: 500.0000 mL | Freq: Once | INTRAVENOUS | Status: DC

## 2019-10-11 NOTE — Progress Notes (Signed)
Temperature taken by J.B., VS taken by C.W. 

## 2019-10-11 NOTE — Progress Notes (Signed)
To pacu, VSS. Report to [email or web address].tb 

## 2019-10-11 NOTE — Patient Instructions (Signed)
HANDOUTS PROVIDED ON: GASTRITIS  The biopsies taken today have been sent for pathology.  The results can take 1-3 weeks to receive.  When your next colonoscopy should occur will be based on the pathology results.    You may resume your previous diet and medication schedule.  Thank you for allowing [place] to care for you today!!!   YOU HAD AN ENDOSCOPIC PROCEDURE TODAY AT [facility]:   Refer to the procedure report that was given to you for any specific questions about what was found during the examination.  If the procedure report does not answer your questions, please call your gastroenterologist to clarify.  If you requested that your care partner not be given the details of your procedure findings, then the procedure report has been included in a sealed envelope for you to review at your convenience later.  YOU SHOULD EXPECT: Some feelings of bloating in the abdomen. Passage of more belching/gas than usual.  Walking can help get rid of the air that was put into your GI tract during the procedure and reduce the bloating.   Please Note:  You might notice some irritation and congestion in your nose or some drainage.  This is from the oxygen used during your procedure.  There is no need for concern and it should clear up in a day or so.  SYMPTOMS TO REPORT IMMEDIATELY:   Following upper endoscopy (EGD)  Vomiting of blood or coffee ground material  New chest pain or pain under the shoulder blades  Painful or persistently difficult swallowing  New shortness of breath  Fever of 100F or higher  Black, tarry-looking stools  For urgent or emergent issues, a gastroenterologist can be reached at any hour by calling [phone]. Do not use MyChart messaging for urgent concerns.    DIET:  We do recommend a small meal at first, but then you may proceed to your regular diet.  Drink plenty of fluids but you should avoid alcoholic beverages for 24 hours.  ACTIVITY:  You should plan  to take it easy for the rest of today and you should NOT DRIVE or use heavy machinery until tomorrow (because of the sedation medicines used during the test).    FOLLOW UP: Our staff will call the number listed on your records 48-72 hours following your procedure to check on you and address any questions or concerns that you may have regarding the information given to you following your procedure. If we do not reach you, we will leave a message.  We will attempt to reach you two times.  During this call, we will ask if you have developed any symptoms of COVID 19. If you develop any symptoms (ie: fever, flu-like symptoms, shortness of breath, cough etc.) before then, please call [phone].  If you test positive for Covid 19 in the 2 weeks post procedure, please call and report this information to [place].    If any biopsies were taken you will be contacted by phone or by letter within the next 1-3 weeks.  Please call us at [phone] if you have not heard about the biopsies in 3 weeks.    SIGNATURES/CONFIDENTIALITY: You and/or your care partner have signed paperwork which will be entered into your electronic medical record.  These signatures attest to the fact that that the information above on your After Visit Summary has been reviewed and is understood.  Full responsibility of the confidentiality of this discharge information lies with you and/or your  care-partner. 

## 2019-10-11 NOTE — Op Note (Signed)
[facility] Patient Name: [name] Procedure Date: 10/11/2019 9:35 AM MRN: [id number] Endoscopist: Milus Banister , MD Age: 44 Referring MD:  Date of Birth: [birth year] Gender: Female Account #: [id number] Procedure:                Upper GI endoscopy Indications:              Epigastric abdominal pain Medicines:                Monitored Anesthesia Care Procedure:                Pre-Anesthesia Assessment:                           - Prior to the procedure, a History and Physical                            was performed, and patient medications and                            allergies were reviewed. The patient's tolerance of                            previous anesthesia was also reviewed. The risks                            and benefits of the procedure and the sedation                            options and risks were discussed with the patient.                            All questions were answered, and informed consent                            was obtained. Prior Anticoagulants: The patient has                            taken no previous anticoagulant or antiplatelet                            agents. ASA Grade Assessment: II - A patient with                            mild systemic disease. After reviewing the risks                            and benefits, the patient was deemed in                            satisfactory condition to undergo the procedure.                           After obtaining informed consent, the endoscope was  passed under direct vision. Throughout the                            procedure, the patient's blood pressure, pulse, and                            oxygen saturations were monitored continuously. The                            Endoscope was introduced through the mouth, and                            advanced to the second part of duodenum. The upper                            GI endoscopy was accomplished  without difficulty.                            The patient tolerated the procedure well. Scope In: Scope Out: Findings:                 Small amount of liquid retained gastric contents.                           Mild inflammation characterized by erythema was                            found in the gastric body and in the gastric                            antrum. Biopsies were taken with a cold forceps for                            histology.                           The exam was otherwise without abnormality. Complications:            No immediate complications. Estimated blood loss:                            None. Estimated Blood Loss:     Estimated blood loss: none. Impression:               - Mild, non-specific gastritis biopsied to check                            for H. pylori.                           - Small amount of retained gastric contents without                            anatomic outlet obstruction, suggests possible  gastroparesis.                           - The examination was otherwise normal. Recommendation:           - Patient has a contact number available for                            emergencies. The signs and symptoms of potential                            delayed complications were discussed with the                            patient. Return to normal activities tomorrow.                            Written discharge instructions were provided to the                            patient.                           - Resume previous diet.                           - Continue present medications.                           - Await pathology results. Will consider gastric                            emptying scan pending the path results. Milus Banister, MD 10/11/2019 9:52:27 AM This report has been signed electronically.

## 2019-10-12 NOTE — Telephone Encounter (Signed)
I called and spoke with [name] and she stated that she needed to have an OV with you to discuss her case for disability.  She is free the last week of April. She did state that she is doing good.

## 2019-10-13 ENCOUNTER — Telehealth: Payer: Self-pay

## 2019-10-13 ENCOUNTER — Telehealth: Payer: Self-pay | Admitting: *Deleted

## 2019-10-13 ENCOUNTER — Other Ambulatory Visit: Payer: Self-pay

## 2019-10-13 NOTE — Telephone Encounter (Signed)
  Follow up Call-  Call back number 10/11/2019  Post procedure Call Back phone  # [phone]  Permission to leave phone message Yes  Some recent data might be hidden     Patient questions:  Do you have a fever, pain , or abdominal swelling? No. Pain Score  0 *  Have you tolerated food without any problems? Yes.    Have you been able to return to your normal activities? Yes.    Do you have any questions about your discharge instructions: Diet   No. Medications  No.   Follow up visit  No.  Do you have questions or concerns about your Care? No.  Actions: * If pain score is 4 or above: No action needed, pain <4.   1. Have you developed a fever since your procedure? no  2.   Have you had an respiratory symptoms (SOB or cough) since your procedure? no  3.   Have you tested positive for COVID 19 since your procedure no  4.   Have you had any family members/close contacts diagnosed with the COVID 19 since your procedure?  no   If yes to any of these questions please route to Joylene John, RN and Erenest Rasher, RN

## 2019-10-13 NOTE — Telephone Encounter (Signed)
Attempted to reach patient for post-procedure f/u call. No answer. Left message that we will make another attempt to reach her later today and for her to please not hesitate to call us if she has any questions/concerns regarding her care. 

## 2019-10-14 ENCOUNTER — Other Ambulatory Visit: Payer: Self-pay

## 2019-10-14 DIAGNOSIS — R109 Unspecified abdominal pain: Secondary | ICD-10-CM

## 2019-10-14 DIAGNOSIS — R112 Nausea with vomiting, unspecified: Secondary | ICD-10-CM

## 2019-10-24 ENCOUNTER — Telehealth: Payer: Self-pay | Admitting: Obstetrics and Gynecology

## 2019-10-24 NOTE — Telephone Encounter (Signed)
Patient is having lower abdominal pain.

## 2019-10-24 NOTE — Telephone Encounter (Signed)
Last OV 05/2019 Last AEX 03/2018 MMG scheduled at [facility] on 11/01/19.Last MMG 11/20- Dx left- Birads 2, benign.  Hx Breast CA 2017- lumpectomy and radiation.  Next PCP appt 4/27 for back and side pain    Spoke with pt. Pt reports having UTI sx of urinary burning, pressure, frequency and lower abd pain x 4 days. Denies fever, chills. Denies using any OTC treatments. Declines appt, due to out of town this week until 4/26. Pt advised to seek Urgent care or [facility] while out of town for UTI sx. Pt agreeable. Pt also states having vuvla "bump" for last 2 days and thinks its an " ingrown hair" Denies using any OTC or new partner, pt is married. Pt denies hx of HSV. Pt declines appt until back in town.   Pt requesting annual exam since being 2 years with Dr Talbert Nan. Pt scheduled for AEX on 11/02/19 at 11am. Pt verbalized understanding and is agreeable to date and time.   Routing to Dr Talbert Nan.  Encounter closed.

## 2019-11-01 ENCOUNTER — Other Ambulatory Visit: Payer: Self-pay

## 2019-11-01 ENCOUNTER — Ambulatory Visit
Admission: RE | Admit: 2019-11-01 | Discharge: 2019-11-01 | Disposition: A | Discharge status: Home / Self Care | Payer: 59 | Source: Ambulatory Visit | Attending: Adult Health | Admitting: Adult Health

## 2019-11-01 DIAGNOSIS — C50412 Malignant neoplasm of upper-outer quadrant of left female breast: Secondary | ICD-10-CM

## 2019-11-01 DIAGNOSIS — Z170 Estrogen receptor positive status [ER+]: Secondary | ICD-10-CM
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
## 2019-11-02 ENCOUNTER — Encounter: Payer: Self-pay | Admitting: Family Medicine

## 2019-11-02 ENCOUNTER — Ambulatory Visit: Payer: Self-pay | Admitting: Obstetrics and Gynecology

## 2019-11-02 ENCOUNTER — Ambulatory Visit (INDEPENDENT_AMBULATORY_CARE_PROVIDER_SITE_OTHER): Payer: 59 | Admitting: Family Medicine

## 2019-11-02 VITALS — BP 128/90 | HR 95 | Temp 95.7°F | Ht 64.0 in | Wt 216.0 lb

## 2019-11-02 DIAGNOSIS — M7918 Myalgia, other site: Secondary | ICD-10-CM

## 2019-11-02 DIAGNOSIS — M546 Pain in thoracic spine: Secondary | ICD-10-CM

## 2019-11-02 DIAGNOSIS — M797 Fibromyalgia: Secondary | ICD-10-CM

## 2019-11-02 DIAGNOSIS — M069 Rheumatoid arthritis, unspecified: Secondary | ICD-10-CM | POA: Diagnosis not present

## 2019-11-02 MED ORDER — METHOCARBAMOL 500 MG PO TABS: 500.0000 mg | ORAL_TABLET | Freq: Two times a day (BID) | ORAL | 1 refills | Status: DC | PRN

## 2019-11-02 MED ORDER — MELOXICAM 15 MG PO TABS: 15.0000 mg | ORAL_TABLET | Freq: Every day | ORAL | 1 refills | Status: DC

## 2019-11-02 MED ORDER — OXYCODONE-ACETAMINOPHEN 5-325 MG PO TABS: 1.0000 | ORAL_TABLET | Freq: Two times a day (BID) | ORAL | 0 refills | Status: DC | PRN
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
## 2019-11-02 NOTE — Patient Instructions (Addendum)
Health Maintenance Due  Topic Date Due  . HIV Screening  Never done  . COVID-19 Vaccine (1) Never done    Depression screen [facility] 2/9 09/05/2019 08/18/2019 05/13/2019  Decreased Interest 0 3 2  Down, Depressed, Hopeless 0 1 2  PHQ - 2 Score 0 4 4  Altered sleeping - 3 2  Tired, decreased energy - 3 3  Change in appetite - 3 2  Feeling bad or failure about yourself  - 1 2  Trouble concentrating - 0 3  Moving slowly or fidgety/restless - 0 2  Suicidal thoughts - 0 2  PHQ-9 Score - 14 20  Difficult doing work/chores - Extremely dIfficult Extremely dIfficult  Some recent data might be hidden   Dr. Briscoe [facility] Address: [address], [place], [place] [address] Phone: [phone]  [email or web address]  Dr. Courtney Heys - PM[facility]

## 2019-11-03 ENCOUNTER — Ambulatory Visit (HOSPITAL_COMMUNITY)
Admission: RE | Admit: 2019-11-03 | Discharge: 2019-11-03 | Disposition: A | Discharge status: Home / Self Care | Payer: 59 | Source: Ambulatory Visit | Attending: Gastroenterology | Admitting: Gastroenterology

## 2019-11-03 ENCOUNTER — Encounter: Payer: Self-pay | Admitting: Physical Medicine and Rehabilitation

## 2019-11-03 ENCOUNTER — Other Ambulatory Visit: Payer: Self-pay

## 2019-11-03 DIAGNOSIS — R109 Unspecified abdominal pain: Secondary | ICD-10-CM | POA: Diagnosis not present

## 2019-11-03 DIAGNOSIS — R112 Nausea with vomiting, unspecified: Secondary | ICD-10-CM | POA: Diagnosis present

## 2019-11-03 MED ORDER — TECHNETIUM TC 99M SULFUR COLLOID
2.2000 | Freq: Once | INTRAVENOUS | Status: AC | PRN
  Administered 2019-11-03: 2.2 via INTRAVENOUS

## 2019-11-05 LAB — PAIN MGMT, PROFILE 8 W/CONF, U
6 Acetylmorphine: NEGATIVE ng/mL
Alcohol Metabolites: NEGATIVE ng/mL (ref <500)
Amphetamines: NEGATIVE ng/mL
Benzodiazepines: NEGATIVE ng/mL
Buprenorphine, Urine: NEGATIVE ng/mL
Cocaine Metabolite: NEGATIVE ng/mL
Creatinine: 193.7 mg/dL
MDMA: NEGATIVE ng/mL
Marijuana Metabolite: 102 ng/mL
Marijuana Metabolite: POSITIVE ng/mL
Opiates: NEGATIVE ng/mL
Oxidant: NEGATIVE ug/mL
Oxycodone: NEGATIVE ng/mL
pH: 5.2 (ref 4.5–9.0)

## 2019-11-14 ENCOUNTER — Encounter (INDEPENDENT_AMBULATORY_CARE_PROVIDER_SITE_OTHER): Payer: Self-pay

## 2019-11-20 ENCOUNTER — Encounter (INDEPENDENT_AMBULATORY_CARE_PROVIDER_SITE_OTHER): Payer: Self-pay | Admitting: Family Medicine

## 2019-11-21 ENCOUNTER — Encounter: Payer: 59 | Attending: Physical Medicine and Rehabilitation | Admitting: Physical Medicine and Rehabilitation

## 2019-11-21 ENCOUNTER — Ambulatory Visit (HOSPITAL_COMMUNITY)
Admission: RE | Admit: 2019-11-21 | Discharge: 2019-11-21 | Disposition: A | Discharge status: Home / Self Care | Payer: 59 | Source: Ambulatory Visit | Attending: Physical Medicine and Rehabilitation | Admitting: Physical Medicine and Rehabilitation

## 2019-11-21 ENCOUNTER — Encounter (INDEPENDENT_AMBULATORY_CARE_PROVIDER_SITE_OTHER): Payer: Self-pay

## 2019-11-21 ENCOUNTER — Other Ambulatory Visit: Payer: Self-pay

## 2019-11-21 ENCOUNTER — Encounter: Payer: Self-pay | Admitting: Physical Medicine and Rehabilitation

## 2019-11-21 VITALS — BP 126/87 | HR 104 | Temp 97.3°F | Ht 64.0 in | Wt 218.0 lb

## 2019-11-21 DIAGNOSIS — Z170 Estrogen receptor positive status [ER+]: Secondary | ICD-10-CM

## 2019-11-21 DIAGNOSIS — M797 Fibromyalgia: Secondary | ICD-10-CM

## 2019-11-21 DIAGNOSIS — G8929 Other chronic pain: Secondary | ICD-10-CM | POA: Insufficient documentation

## 2019-11-21 DIAGNOSIS — M069 Rheumatoid arthritis, unspecified: Secondary | ICD-10-CM | POA: Diagnosis not present

## 2019-11-21 DIAGNOSIS — M546 Pain in thoracic spine: Secondary | ICD-10-CM | POA: Insufficient documentation

## 2019-11-21 DIAGNOSIS — C50412 Malignant neoplasm of upper-outer quadrant of left female breast: Secondary | ICD-10-CM

## 2019-11-21 MED ORDER — LIDOCAINE 5 % EX PTCH: 2.0000 | MEDICATED_PATCH | CUTANEOUS | 5 refills | Status: DC

## 2019-11-21 MED ORDER — CYCLOBENZAPRINE HCL 10 MG PO TABS: 10.0000 mg | ORAL_TABLET | Freq: Two times a day (BID) | ORAL | 3 refills | Status: DC | PRN

## 2019-11-21 MED ORDER — PREGABALIN 50 MG PO CAPS
50.0000 mg | ORAL_CAPSULE | Freq: Three times a day (TID) | ORAL | 3 refills | Status: DC
Start: 2019-11-21 — End: 2020-02-15

## 2019-11-21 NOTE — Addendum Note (Signed)
Addended by: Courtney Heys on: 11/21/2019 04:11 PM   Modules accepted: Orders

## 2019-11-21 NOTE — Telephone Encounter (Signed)
Patient req to r/s her new patient appt.

## 2019-11-21 NOTE — Patient Instructions (Signed)
Patient is 44 yr old R handed female with hx of RA (dx'd 2019), breast CA- 2017 dx'd, and Fibromyalgia- secondary to breast cancer TREATMENT-- Sx's since 2019; and here for evaluation of L>>>R Thoracic back pain.    1. Need to get CXR, rib xray- to rule out cancer hx playing a role in this pain.  2. Also if that's negative,  will do CT of chest to make sure cancer recurrence is not an issue.   3. Flexeril /Cyclobenzaprine- 10 mg 1-2x/day as needed- muscle relaxant.   4. Change Gabapentin to Lyrica  50 mg 3x/day- FDA approved for fibromyalgia.   5. Stop Gabapentin immediately  6. Will get her back ASAP for trigger point injections.    7. Theracane- 2-4 minutes on tight muscle- no massage- firm pressure- youtube has videoes to show how to use it.  If too painful on thoracic back pain, use tennis balls. Best migraine muscles- levators and scalenes  8. Lidocaine patches- 5% necessary due to severe nerve/stabbing pain- 12 hrs on;12 hrs off. Need to try to help control pain better without opiates.   9. Oncologist- is Dr Nat Math- at [name].   10. Voltaren gel- OTC- can use on hands/wrists/ankles up to 4x/day. 2 grams at a time.   11. F/U in 1-2 weeks for injections.
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
## 2019-11-22 ENCOUNTER — Ambulatory Visit (INDEPENDENT_AMBULATORY_CARE_PROVIDER_SITE_OTHER): Payer: 59 | Admitting: Family Medicine

## 2019-11-29 ENCOUNTER — Telehealth: Payer: Self-pay | Admitting: *Deleted

## 2019-11-29 NOTE — Telephone Encounter (Signed)
Rec'd fax prior authorization determination letter from [facility] regarding pregabalin 50 mg capsule.  APPROVED  11/25/2019 - 11/24/2020

## 2019-11-29 NOTE — Telephone Encounter (Signed)
rec'd fax prior authorization determination from [facility] for lidocaine patches.  DENIED  Post herpetic neuralgia and diabetic neuropathy are the only accepted indications

## 2019-12-01 ENCOUNTER — Other Ambulatory Visit: Payer: Self-pay | Admitting: Neurology

## 2019-12-01 ENCOUNTER — Encounter: Payer: Self-pay | Admitting: Family Medicine

## 2019-12-06 ENCOUNTER — Ambulatory Visit (INDEPENDENT_AMBULATORY_CARE_PROVIDER_SITE_OTHER): Payer: 59 | Admitting: Family Medicine

## 2019-12-12 ENCOUNTER — Ambulatory Visit: Payer: 59 | Admitting: Physical Medicine and Rehabilitation

## 2019-12-20 ENCOUNTER — Encounter: Payer: Self-pay | Admitting: Family Medicine

## 2019-12-20 DIAGNOSIS — F5101 Primary insomnia: Secondary | ICD-10-CM

## 2019-12-20 DIAGNOSIS — R5383 Other fatigue: Secondary | ICD-10-CM

## 2019-12-21 ENCOUNTER — Telehealth: Payer: Self-pay | Admitting: Obstetrics and Gynecology

## 2019-12-21 DIAGNOSIS — Z8742 Personal history of other diseases of the female genital tract: Secondary | ICD-10-CM

## 2019-12-21 DIAGNOSIS — R102 Pelvic and perineal pain: Secondary | ICD-10-CM

## 2019-12-21 MED ORDER — LISDEXAMFETAMINE DIMESYLATE 50 MG PO CAPS: 50.0000 mg | ORAL_CAPSULE | Freq: Every day | ORAL | 0 refills | Status: DC

## 2019-12-21 MED ORDER — ZOLPIDEM TARTRATE 10 MG PO TABS: 10.0000 mg | ORAL_TABLET | Freq: Every evening | ORAL | 2 refills | Status: DC | PRN

## 2019-12-21 NOTE — Telephone Encounter (Signed)
See if we can get her in for an ultrasound to rule out her ovaries as a source of her pain. She does have a h/o endometriosis, h/o LAVH (not BSO)

## 2019-12-21 NOTE — Telephone Encounter (Signed)
Patient is having abdominal pain 

## 2019-12-21 NOTE — Telephone Encounter (Signed)
Spoke with patient. Patient is requesting to schedule AEX and OV for abdominal pain and recurrent UTIs.   Patient reports mid, lower abdominal pain for 1 wk, currently 4/10 after taking oxycodone prescribed by PCP. Denies any urinary symptoms currently.  States symptoms typically resolve after increasing fluids and drinking cranberry juice. Denies N/V, fever/chills. Reports normal bowel movements. Hx of hysterectomy, still has ovaries.   Last AEX 03/25/18. Scheduled AEX for 03/05/20 at 9am with Dr. Talbert Nan.   Patient will be traveling out of town 6/23 -7/1, offered OV on 7/6, patient declined, requesting earlier OV. Advised our office will be closed 7/2 -7/5. Advised patient I will have to review with provider and return call. Also reviewed option of f/u with PCP for earlier appt. Patient is agreeable.   Dr. Talbert Nan -please review.

## 2019-12-21 NOTE — Telephone Encounter (Signed)
Last OV 11/02/19 Last fill for both meds 08/18/19 #30/0

## 2019-12-21 NOTE — Telephone Encounter (Signed)
Call placed to patient. Advised per Dr. Talbert Nan. Patient declines PUS at [facility] on 6/17 due to work schedule. Patient request to schedule at outside imaging facility prior to traveling out of town on 12/28/19. Advised patient I will contact [facility] scheduling and [facility] for scheduling options and return call. Patient agreeable.   Call placed to [facility] scheduling, spoke with Peggy. Patient scheduled for US pelvis complete w/ transvaginal on 12/26/19 at 11:30am, arrive at 11:15am at [place] with full bladder.   Call returned to patient, advised of appt and instructions as seen above. Patient verbalizes understanding and is agreeable to date and time.   OV scheduled for consult on 12/27/19 at 2:15pm with Dr. Talbert Nan.   Routing to provider for final review. Patient is agreeable to disposition. Will close encounter.  Cc: Wilson Singer, Magdalene Patricia

## 2019-12-26 ENCOUNTER — Encounter: Payer: Self-pay | Admitting: Physical Medicine and Rehabilitation

## 2019-12-26 ENCOUNTER — Other Ambulatory Visit: Payer: Self-pay

## 2019-12-26 ENCOUNTER — Encounter: Payer: Self-pay | Admitting: Family Medicine

## 2019-12-26 ENCOUNTER — Ambulatory Visit (HOSPITAL_COMMUNITY)
Admission: RE | Admit: 2019-12-26 | Discharge: 2019-12-26 | Disposition: A | Discharge status: Home / Self Care | Payer: 59 | Source: Ambulatory Visit | Attending: Obstetrics and Gynecology | Admitting: Obstetrics and Gynecology

## 2019-12-26 ENCOUNTER — Encounter: Payer: 59 | Attending: Physical Medicine and Rehabilitation | Admitting: Physical Medicine and Rehabilitation

## 2019-12-26 VITALS — BP 113/79 | HR 87 | Temp 97.2°F | Ht 64.0 in | Wt 217.0 lb

## 2019-12-26 DIAGNOSIS — Z90710 Acquired absence of both cervix and uterus: Secondary | ICD-10-CM | POA: Insufficient documentation

## 2019-12-26 DIAGNOSIS — R102 Pelvic and perineal pain: Secondary | ICD-10-CM | POA: Diagnosis not present

## 2019-12-26 DIAGNOSIS — M546 Pain in thoracic spine: Secondary | ICD-10-CM | POA: Diagnosis not present

## 2019-12-26 DIAGNOSIS — M069 Rheumatoid arthritis, unspecified: Secondary | ICD-10-CM | POA: Diagnosis not present

## 2019-12-26 DIAGNOSIS — Z8742 Personal history of other diseases of the female genital tract: Secondary | ICD-10-CM | POA: Insufficient documentation

## 2019-12-26 DIAGNOSIS — M797 Fibromyalgia: Secondary | ICD-10-CM | POA: Diagnosis not present

## 2019-12-26 DIAGNOSIS — G8929 Other chronic pain: Secondary | ICD-10-CM

## 2019-12-26 DIAGNOSIS — Z853 Personal history of malignant neoplasm of breast: Secondary | ICD-10-CM | POA: Diagnosis not present

## 2019-12-26 MED ORDER — PREGABALIN 50 MG PO CAPS
50.0000 mg | ORAL_CAPSULE | Freq: Three times a day (TID) | ORAL | 5 refills | Status: DC
Start: 2019-12-26 — End: 2019-12-27

## 2019-12-26 NOTE — Telephone Encounter (Signed)
Please see message and advise.  Thank you. ° °

## 2019-12-26 NOTE — Patient Instructions (Signed)
Plan: 1. Trigger point injections Patient here for trigger point injections for myofascial/nerve pain thoracic  Consent done and on chart.  Cleaned areas with alcohol and injected using a 27 gauge 1.5 inch needle  Injected 3cc Using 1% Lidocaine with no EPI  Upper traps Levators Posterior scalenes Middle scalenes Splenius Capitus Pectoralis Major Rhomboids B/L  x5 on L; 4 on R Infraspinatus Teres Major/minor Thoracic paraspinals Lumbar paraspinals Other injections-    Patient's level of pain prior was- 7-8/10 Current level of pain after injections is- 6/10  There was no bleeding or complications.  Patient was advised to drink a lot of water on day after injections to flush system Will have increased soreness for 12-48 hours after injections.  Can use Lidocaine patches the day AFTER injections Can use theracane on day of injections in places didn't inject or TOMORROW Can use heating pad or ice 4-6 hours AFTER injections  2. Hasn't been able to switch to Lyrica- has tried Duloxetine- wasn't effective.  Found a way to cover Lyrica with good Rx- sent in for 50 mg 3x/day.   3. F/U 4-6 weeks- remember to add in Neck trigger points for migraine prophylaxis next time.

## 2019-12-26 NOTE — Progress Notes (Signed)
Patient is 44 yr old R handed female with hx of RA (dx'd 2019), breast CA- 2017 dx'd, and Fibromyalgia- secondary to breast cancer TREATMENT-- Sx's since 2019; and here for evaluation of L>>>R Thoracic back pain.    Getting CT scan tomorrow morning to make sure cancer is not cause of pain.    Lyrica got approved- copay was still really high- $100.  Has [facility].   Creams helps a little bit. On hands/wrists and ankles Using heat-  Didn't get theracane- can't get due to  cost.  Tried tennis balls- was painful- wasn't pushing "too hard'.      Plan: 1. Trigger point injections Patient here for trigger point injections for myofascial/nerve pain thoracic  Consent done and on chart.  Cleaned areas with alcohol and injected using a 27 gauge 1.5 inch needle  Injected 3cc Using 1% Lidocaine with no EPI  Upper traps Levators Posterior scalenes Middle scalenes Splenius Capitus Pectoralis Major Rhomboids B/L  x5 on L; 4 on R Infraspinatus Teres Major/minor Thoracic paraspinals Lumbar paraspinals Other injections-    Patient's level of pain prior was- 7-8/10 Current level of pain after injections is- 6/10  There was no bleeding or complications.  Patient was advised to drink a lot of water on day after injections to flush system Will have increased soreness for 12-48 hours after injections.  Can use Lidocaine patches the day AFTER injections Can use theracane on day of injections in places didn't inject or TOMORROW Can use heating pad or ice 4-6 hours AFTER injections  2. Hasn't been able to switch to Lyrica- has tried Duloxetine- wasn't effective.  Found a way to cover Lyrica with good Rx- sent in for 50 mg 3x/day.   3. F/U 4-6 weeks- remember to add in Neck trigger points for migraine prophylaxis next time.

## 2019-12-27 ENCOUNTER — Encounter: Payer: Self-pay | Admitting: Obstetrics and Gynecology

## 2019-12-27 ENCOUNTER — Ambulatory Visit (HOSPITAL_COMMUNITY)
Admission: RE | Admit: 2019-12-27 | Discharge: 2019-12-27 | Disposition: A | Discharge status: Home / Self Care | Payer: 59 | Source: Ambulatory Visit | Attending: Physical Medicine and Rehabilitation | Admitting: Physical Medicine and Rehabilitation

## 2019-12-27 ENCOUNTER — Ambulatory Visit (INDEPENDENT_AMBULATORY_CARE_PROVIDER_SITE_OTHER): Payer: 59 | Admitting: Obstetrics and Gynecology

## 2019-12-27 ENCOUNTER — Encounter (HOSPITAL_COMMUNITY): Payer: Self-pay

## 2019-12-27 VITALS — BP 130/68 | HR 106 | Temp 97.2°F | Ht 64.0 in | Wt 216.0 lb

## 2019-12-27 DIAGNOSIS — N941 Unspecified dyspareunia: Secondary | ICD-10-CM | POA: Diagnosis not present

## 2019-12-27 DIAGNOSIS — C50412 Malignant neoplasm of upper-outer quadrant of left female breast: Secondary | ICD-10-CM | POA: Insufficient documentation

## 2019-12-27 DIAGNOSIS — Z170 Estrogen receptor positive status [ER+]: Secondary | ICD-10-CM | POA: Insufficient documentation
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
## 2019-12-28 ENCOUNTER — Telehealth: Payer: Self-pay | Admitting: Obstetrics and Gynecology

## 2019-12-28 ENCOUNTER — Encounter: Payer: Self-pay | Admitting: Obstetrics and Gynecology

## 2019-12-28 ENCOUNTER — Telehealth: Payer: Self-pay | Admitting: Family Medicine

## 2019-12-28 MED ORDER — PREDNISONE 5 MG PO TABS: 5.0000 mg | ORAL_TABLET | Freq: Every day | ORAL | 0 refills | Status: DC

## 2019-12-28 NOTE — Telephone Encounter (Signed)
Patient would like you to call her regarding reactions from medications.

## 2019-12-28 NOTE — Telephone Encounter (Signed)
mychart message sent

## 2019-12-30 ENCOUNTER — Telehealth: Payer: Self-pay

## 2019-12-30 NOTE — Telephone Encounter (Signed)
Have called pt and discussed- Ambien is most likely culprit, but esp combined with all those meds, explained to pt on phone, try to get Ambien changed to something else and to spread out meds, as said above.  Thanks- ML

## 2019-12-30 NOTE — Telephone Encounter (Signed)
Patient called requesting advice on possible medication adverse reaction she experienced. She states she was recently started on Lyrica 50 mg TID and had one instance where after taking evening dose of Lyrica she awoke the next morning with no recollection of the previous evening. She states she took  Her Lyrica, Ambien, muscle relaxer, and Percocet that evening.   Advised patient that she is on multiple CNS depressant medications which could be contributing to memory impairment and over sedation. Advised patient to space out medication use as much as possible and advised to minimize use of ambien and percocet unless needed.   Patient states she did not want to stop Lyrica at this time to see if it is beneficial.   Will forward to Dr. Bryan Lemma and Dr. Dagoberto Ligas so they are aware.   [place] at [facility]  [phone]
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
## 2020-01-05 ENCOUNTER — Other Ambulatory Visit: Payer: Self-pay

## 2020-01-05 ENCOUNTER — Ambulatory Visit: Payer: 59 | Attending: Rheumatology

## 2020-01-05 DIAGNOSIS — M069 Rheumatoid arthritis, unspecified: Secondary | ICD-10-CM

## 2020-01-05 DIAGNOSIS — G8929 Other chronic pain: Secondary | ICD-10-CM

## 2020-01-05 DIAGNOSIS — M546 Pain in thoracic spine: Secondary | ICD-10-CM | POA: Diagnosis present
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
## 2020-01-06 ENCOUNTER — Encounter: Payer: Self-pay | Admitting: Family Medicine

## 2020-01-06 ENCOUNTER — Encounter: Payer: Self-pay | Admitting: Neurology

## 2020-01-06 ENCOUNTER — Ambulatory Visit (INDEPENDENT_AMBULATORY_CARE_PROVIDER_SITE_OTHER): Payer: 59 | Admitting: Neurology

## 2020-01-06 VITALS — BP 141/88 | HR 96 | Ht 64.0 in | Wt 219.6 lb

## 2020-01-06 DIAGNOSIS — G3184 Mild cognitive impairment, so stated: Secondary | ICD-10-CM

## 2020-01-06 DIAGNOSIS — F4321 Adjustment disorder with depressed mood: Secondary | ICD-10-CM

## 2020-01-06 DIAGNOSIS — M797 Fibromyalgia: Secondary | ICD-10-CM

## 2020-01-06 DIAGNOSIS — G43709 Chronic migraine without aura, not intractable, without status migrainosus: Secondary | ICD-10-CM | POA: Diagnosis not present

## 2020-01-06 DIAGNOSIS — M069 Rheumatoid arthritis, unspecified: Secondary | ICD-10-CM

## 2020-01-06 MED ORDER — OXYCODONE-ACETAMINOPHEN 5-325 MG PO TABS: 1.0000 | ORAL_TABLET | Freq: Two times a day (BID) | ORAL | 0 refills | Status: DC | PRN

## 2020-01-06 MED ORDER — EMGALITY 120 MG/ML ~~LOC~~ SOAJ: 120.0000 mg | SUBCUTANEOUS | 11 refills | Status: DC

## 2020-01-06 NOTE — Patient Instructions (Signed)
  1. STOP PROPRANOLOL 2. Start [employer].  2 injections for first dose, then 1 injection every 28 days.   3. Take eletriptan 40mg  at earliest onset of headache.  May repeat dose once in 2 hours if needed.  Maximum 2 tablets in 24 hours. 4. Limit use of pain relievers to no more than 2 days out of the week.  These medications include acetaminophen, NSAIDs (ibuprofen/Advil/Motrin, naproxen/Aleve, triptans (Imitrex/sumatriptan), Excedrin, and narcotics.  This will help reduce risk of rebound headaches. 5. Be aware of common food triggers:  - Caffeine:  coffee, black tea, cola, Mt. Dew  - Chocolate  - Dairy:  aged cheeses (brie, blue, cheddar, gouda, [place], provolone, [place], Swiss, etc), chocolate milk, buttermilk, sour cream, limit eggs and yogurt  - Nuts, peanut butter  - Alcohol  - Cereals/grains:  FRESH breads (fresh bagels, sourdough, doughnuts), yeast productions  - Processed/canned/aged/cured meats (pre-packaged deli meats, hotdogs)  - MSG/glutamate:  soy sauce, flavor enhancer, pickled/preserved/marinated foods  - Sweeteners:  aspartame (Equal, Nutrasweet).  Sugar and Splenda are okay  - Vegetables:  legumes (lima beans, lentils, snow peas, fava beans, pinto peans, peas, garbanzo beans), sauerkraut, onions, olives, pickles  - Fruit:  avocados, bananas, citrus fruit (orange, lemon, grapefruit), mango  - Other:  Frozen meals, macaroni and cheese 6. Routine exercise 7. Stay adequately hydrated (aim for 64 oz water daily) 8. Keep headache diary 9. Maintain proper stress management 10. Maintain proper sleep hygiene 11. Do not skip meals 12. Consider supplements:  magnesium citrate 400mg  daily, riboflavin 400mg  daily, coenzyme Q10 100mg  three times daily.

## 2020-01-06 NOTE — Telephone Encounter (Signed)
Last OV 11/02/19 Last fill 11/02/19  #60/0

## 2020-01-25 ENCOUNTER — Encounter: Payer: Self-pay | Admitting: Family Medicine

## 2020-01-25 ENCOUNTER — Encounter (INDEPENDENT_AMBULATORY_CARE_PROVIDER_SITE_OTHER): Payer: Self-pay | Admitting: Family Medicine

## 2020-01-25 DIAGNOSIS — M069 Rheumatoid arthritis, unspecified: Secondary | ICD-10-CM

## 2020-01-25 DIAGNOSIS — M797 Fibromyalgia: Secondary | ICD-10-CM

## 2020-01-25 DIAGNOSIS — F5101 Primary insomnia: Secondary | ICD-10-CM

## 2020-01-25 DIAGNOSIS — F324 Major depressive disorder, single episode, in partial remission: Secondary | ICD-10-CM

## 2020-01-26 MED ORDER — ZOLPIDEM TARTRATE 10 MG PO TABS: 10.0000 mg | ORAL_TABLET | Freq: Every evening | ORAL | 0 refills | Status: DC | PRN

## 2020-01-26 MED ORDER — METHOCARBAMOL 500 MG PO TABS: 500.0000 mg | ORAL_TABLET | Freq: Two times a day (BID) | ORAL | 0 refills | Status: DC | PRN

## 2020-01-26 MED ORDER — OXYCODONE-ACETAMINOPHEN 5-325 MG PO TABS: 1.0000 | ORAL_TABLET | Freq: Two times a day (BID) | ORAL | 0 refills | Status: DC | PRN

## 2020-01-30 ENCOUNTER — Other Ambulatory Visit: Payer: Self-pay | Admitting: Family Medicine

## 2020-01-30 DIAGNOSIS — F324 Major depressive disorder, single episode, in partial remission: Secondary | ICD-10-CM

## 2020-01-30 DIAGNOSIS — M069 Rheumatoid arthritis, unspecified: Secondary | ICD-10-CM

## 2020-01-30 DIAGNOSIS — M797 Fibromyalgia: Secondary | ICD-10-CM

## 2020-02-07 ENCOUNTER — Other Ambulatory Visit: Payer: Self-pay | Admitting: Family Medicine

## 2020-02-07 DIAGNOSIS — M069 Rheumatoid arthritis, unspecified: Secondary | ICD-10-CM

## 2020-02-07 DIAGNOSIS — F324 Major depressive disorder, single episode, in partial remission: Secondary | ICD-10-CM

## 2020-02-07 DIAGNOSIS — M797 Fibromyalgia: Secondary | ICD-10-CM

## 2020-02-08 MED ORDER — VENLAFAXINE HCL ER 150 MG PO CP24: ORAL_CAPSULE | ORAL | 1 refills | Status: DC

## 2020-02-08 NOTE — Telephone Encounter (Signed)
Please see message and advise.  Thank you. ° °
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
## 2020-02-15 ENCOUNTER — Ambulatory Visit (INDEPENDENT_AMBULATORY_CARE_PROVIDER_SITE_OTHER): Payer: 59 | Admitting: Obstetrics and Gynecology

## 2020-02-15 ENCOUNTER — Telehealth: Payer: Self-pay | Admitting: Oncology

## 2020-02-15 ENCOUNTER — Other Ambulatory Visit: Payer: Self-pay

## 2020-02-15 ENCOUNTER — Encounter: Payer: Self-pay | Admitting: Obstetrics and Gynecology

## 2020-02-15 ENCOUNTER — Ambulatory Visit: Payer: 59 | Admitting: Family Medicine

## 2020-02-15 VITALS — BP 140/80 | HR 108 | Ht 64.0 in | Wt 219.2 lb

## 2020-02-15 DIAGNOSIS — Z01419 Encounter for gynecological examination (general) (routine) without abnormal findings: Secondary | ICD-10-CM | POA: Diagnosis not present

## 2020-02-15 DIAGNOSIS — Z853 Personal history of malignant neoplasm of breast: Secondary | ICD-10-CM

## 2020-02-15 DIAGNOSIS — Z90710 Acquired absence of both cervix and uterus: Secondary | ICD-10-CM

## 2020-02-15 DIAGNOSIS — R102 Pelvic and perineal pain: Secondary | ICD-10-CM | POA: Diagnosis not present

## 2020-02-15 DIAGNOSIS — Z8742 Personal history of other diseases of the female genital tract: Secondary | ICD-10-CM

## 2020-02-15 DIAGNOSIS — Z113 Encounter for screening for infections with a predominantly sexual mode of transmission: Secondary | ICD-10-CM

## 2020-02-15 NOTE — Telephone Encounter (Signed)
Rescheduled appts 9/7 appts per provider PAL. Pt confirmed new appt date and time.
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
## 2020-02-15 NOTE — Patient Instructions (Signed)
EXERCISE AND DIET:  We recommended that you start or continue a regular exercise program for good health. Regular exercise means any activity that makes your heart beat faster and makes you sweat.  We recommend exercising at least 30 minutes per day at least 3 days a week, preferably 4 or 5.  We also recommend a diet low in fat and sugar.  Inactivity, poor dietary choices and obesity can cause diabetes, heart attack, stroke, and kidney damage, among others.    ALCOHOL AND SMOKING:  Women should limit their alcohol intake to no more than 7 drinks/beers/glasses of wine (combined, not each!) per week. Moderation of alcohol intake to this level decreases your risk of breast cancer and liver damage. And of course, no recreational drugs are part of a healthy lifestyle.  And absolutely no smoking or even second hand smoke. Most people know smoking can cause heart and lung diseases, but did you know it also contributes to weakening of your bones? Aging of your skin?  Yellowing of your teeth and nails?  CALCIUM AND VITAMIN D:  Adequate intake of calcium and Vitamin D are recommended.  The recommendations for exact amounts of these supplements seem to change often, but generally speaking 1,000 mg of calcium (between diet and supplement) and 800 units of Vitamin D per day seems prudent. Certain women may benefit from higher intake of Vitamin D.  If you are among these women, your doctor will have told you during your visit.    PAP SMEARS:  Pap smears, to check for cervical cancer or precancers,  have traditionally been done yearly, although recent scientific advances have shown that most women can have pap smears less often.  However, every woman still should have a physical exam from her gynecologist every year. It will include a breast check, inspection of the vulva and vagina to check for abnormal growths or skin changes, a visual exam of the cervix, and then an exam to evaluate the size and shape of the uterus and  ovaries.  And after 46 years of age, a rectal exam is indicated to check for rectal cancers. We will also provide age appropriate advice regarding health maintenance, like when you should have certain vaccines, screening for sexually transmitted diseases, bone density testing, colonoscopy, mammograms, etc.   MAMMOGRAMS:  All women over 19 years old should have a yearly mammogram. Many facilities now offer a "3D" mammogram, which may cost around $50 extra out of pocket. If possible,  we recommend you accept the option to have the 3D mammogram performed.  It both reduces the number of women who will be called back for extra views which then turn out to be normal, and it is better than the routine mammogram at detecting truly abnormal areas.    COLON CANCER SCREENING: Now recommend starting at age 47. At this time colonoscopy is not covered for routine screening until 50. There are take home tests that can be done between 45-49.   COLONOSCOPY:  Colonoscopy to screen for colon cancer is recommended for all women at age 65.  We know, you hate the idea of the prep.  We agree, BUT, having colon cancer and not knowing it is worse!!  Colon cancer so often starts as a polyp that can be seen and removed at colonscopy, which can quite literally save your life!  And if your first colonoscopy is normal and you have no family history of colon cancer, most women don't have to have it again for  10 years.  Once every ten years, you can do something that may end up saving your life, right?  We will be happy to help you get it scheduled when you are ready.  Be sure to check your insurance coverage so you understand how much it will cost.  It may be covered as a preventative service at no cost, but you should check your particular policy.      Breast Self-Awareness Breast self-awareness means being familiar with how your breasts look and feel. It involves checking your breasts regularly and reporting any changes to your  health care provider. Practicing breast self-awareness is important. A change in your breasts can be a sign of a serious medical problem. Being familiar with how your breasts look and feel allows you to find any problems early, when treatment is more likely to be successful. All women should practice breast self-awareness, including women who have had breast implants. How to do a breast self-exam One way to learn what is normal for your breasts and whether your breasts are changing is to do a breast self-exam. To do a breast self-exam: Look for Changes  1. Remove all the clothing above your waist. 2. Stand in front of a mirror in a room with good lighting. 3. Put your hands on your hips. 4. Push your hands firmly downward. 5. Compare your breasts in the mirror. Look for differences between them (asymmetry), such as: ? Differences in shape. ? Differences in size. ? Puckers, dips, and bumps in one breast and not the other. 6. Look at each breast for changes in your skin, such as: ? Redness. ? Scaly areas. 7. Look for changes in your nipples, such as: ? Discharge. ? Bleeding. ? Dimpling. ? Redness. ? A change in position. Feel for Changes Carefully feel your breasts for lumps and changes. It is best to do this while lying on your back on the floor and again while sitting or standing in the shower or tub with soapy water on your skin. Feel each breast in the following way:  Place the arm on the side of the breast you are examining above your head.  Feel your breast with the other hand.  Start in the nipple area and make  inch (2 cm) overlapping circles to feel your breast. Use the pads of your three middle fingers to do this. Apply light pressure, then medium pressure, then firm pressure. The light pressure will allow you to feel the tissue closest to the skin. The medium pressure will allow you to feel the tissue that is a little deeper. The firm pressure will allow you to feel the tissue  close to the ribs.  Continue the overlapping circles, moving downward over the breast until you feel your ribs below your breast.  Move one finger-width toward the center of the body. Continue to use the  inch (2 cm) overlapping circles to feel your breast as you move slowly up toward your collarbone.  Continue the up and down exam using all three pressures until you reach your armpit.  Write Down What You Find  Write down what is normal for each breast and any changes that you find. Keep a written record with breast changes or normal findings for each breast. By writing this information down, you do not need to depend only on memory for size, tenderness, or location. Write down where you are in your menstrual cycle, if you are still menstruating. If you are having trouble noticing differences   in your breasts, do not get discouraged. With time you will become more familiar with the variations in your breasts and more comfortable with the exam. How often should I examine my breasts? Examine your breasts every month. If you are breastfeeding, the best time to examine your breasts is after a feeding or after using a breast pump. If you menstruate, the best time to examine your breasts is 5-7 days after your period is over. During your period, your breasts are lumpier, and it may be more difficult to notice changes. When should I see my health care provider? See your health care provider if you notice:  A change in shape or size of your breasts or nipples.  A change in the skin of your breast or nipples, such as a reddened or scaly area.  Unusual discharge from your nipples.  A lump or thick area that was not there before.  Pain in your breasts.  Anything that concerns you.  Breast Tenderness Breast tenderness is a common problem for women of all ages, but may also occur in men. Breast tenderness may range from mild discomfort to severe pain. In women, the pain usually comes and goes with  the menstrual cycle, but it can also be constant. Breast tenderness has many possible causes, including hormone changes, infections, and taking certain medicines. You may have tests, such as a mammogram or an ultrasound, to check for any unusual findings. Having breast tenderness usually does not mean that you have breast cancer. Follow these instructions at home: Managing pain and discomfort   If directed, put ice to the painful area. To do this: ? Put ice in a plastic bag. ? Place a towel between your skin and the bag. ? Leave the ice on for 20 minutes, 2-3 times a day.  Wear a supportive bra, especially during exercise. You may also want to wear a supportive bra while sleeping if your breasts are very tender. Medicines  Take over-the-counter and prescription medicines only as told by your health care provider. If the cause of your pain is infection, you may be prescribed an antibiotic medicine.  If you were prescribed an antibiotic, take it as told by your health care provider. Do not stop taking the antibiotic even if you start to feel better. Eating and drinking  Your health care provider may recommend that you lessen the amount of fat in your diet. You can do this by: ? Limiting fried foods. ? Cooking foods using methods such as baking, boiling, grilling, and broiling.  Decrease the amount of caffeine in your diet. Instead, drink more water and choose caffeine-free drinks. General instructions   Keep a log of the days and times when your breasts are most tender.  Ask your health care provider how to do breast exams at home. This will help you notice if you have an unusual growth or lump.  Keep all follow-up visits as told by your health care provider. This is important. Contact a health care provider if:  Any part of your breast is hard, red, and hot to the touch. This may be a sign of infection.  You are a woman and: ? Not breastfeeding and you have fluid, especially blood  or pus, coming out of your nipples. ? Have a new or painful lump in your breast that remains after your menstrual period ends.  You have a fever.  Your pain does not improve or it gets worse.  Your pain is interfering with your daily  activities. Summary  Breast tenderness may range from mild discomfort to severe pain.  Breast tenderness has many possible causes, including hormone changes, infections, and taking certain medicines.  It can be treated with ice, wearing a supportive bra, and medicines.  Make changes to your diet if told to by your health care provider. This information is not intended to replace advice given to you by your health care provider. Make sure you discuss any questions you have with your health care provider. Document Revised: 11/15/2018 Document Reviewed: 11/15/2018 Elsevier Patient Education  [place].

## 2020-02-16 ENCOUNTER — Encounter: Payer: Self-pay | Admitting: Family Medicine

## 2020-02-16 ENCOUNTER — Ambulatory Visit (INDEPENDENT_AMBULATORY_CARE_PROVIDER_SITE_OTHER): Payer: 59 | Admitting: Family Medicine

## 2020-02-16 VITALS — BP 118/88 | HR 99 | Temp 98.0°F | Resp 18 | Wt 218.0 lb

## 2020-02-16 DIAGNOSIS — D229 Melanocytic nevi, unspecified: Secondary | ICD-10-CM

## 2020-02-16 DIAGNOSIS — E669 Obesity, unspecified: Secondary | ICD-10-CM | POA: Diagnosis not present

## 2020-02-16 DIAGNOSIS — Z6837 Body mass index (BMI) 37.0-37.9, adult: Secondary | ICD-10-CM

## 2020-02-16 LAB — ESTRADIOL: Estradiol: 45.4 pg/mL

## 2020-02-16 LAB — HSV(HERPES SIMPLEX VRS) I + II AB-IGG
HSV 1 Glycoprotein G Ab, IgG: <0.91 index (ref 0.00–0.90)
HSV 2 IgG, Type Spec: <0.91 index (ref 0.00–0.90)

## 2020-02-16 LAB — FOLLICLE STIMULATING HORMONE: FSH: 9.1 m[IU]/mL

## 2020-02-16 MED ORDER — NALTREXONE-BUPROPION HCL ER 8-90 MG PO TB12: ORAL_TABLET | ORAL | 2 refills | Status: DC
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
## 2020-02-16 NOTE — Patient Instructions (Signed)
[facility] Dr. Jamse [place]  Address: [address], [place], [place] [address] Phone: [phone]

## 2020-02-17 ENCOUNTER — Encounter: Payer: Self-pay | Admitting: Physical Medicine and Rehabilitation

## 2020-02-17 ENCOUNTER — Encounter: Payer: 59 | Attending: Physical Medicine and Rehabilitation | Admitting: Physical Medicine and Rehabilitation

## 2020-02-17 ENCOUNTER — Other Ambulatory Visit: Payer: Self-pay

## 2020-02-17 VITALS — BP 124/88 | HR 93 | Temp 97.8°F | Ht 64.0 in | Wt 216.4 lb

## 2020-02-17 DIAGNOSIS — R102 Pelvic and perineal pain: Secondary | ICD-10-CM | POA: Insufficient documentation

## 2020-02-17 DIAGNOSIS — M797 Fibromyalgia: Secondary | ICD-10-CM | POA: Diagnosis not present

## 2020-02-17 DIAGNOSIS — G43709 Chronic migraine without aura, not intractable, without status migrainosus: Secondary | ICD-10-CM | POA: Diagnosis not present

## 2020-02-17 DIAGNOSIS — Z8742 Personal history of other diseases of the female genital tract: Secondary | ICD-10-CM | POA: Insufficient documentation

## 2020-02-17 DIAGNOSIS — G8929 Other chronic pain: Secondary | ICD-10-CM

## 2020-02-17 DIAGNOSIS — M069 Rheumatoid arthritis, unspecified: Secondary | ICD-10-CM | POA: Diagnosis not present

## 2020-02-17 DIAGNOSIS — Z853 Personal history of malignant neoplasm of breast: Secondary | ICD-10-CM | POA: Diagnosis not present

## 2020-02-17 DIAGNOSIS — Z90710 Acquired absence of both cervix and uterus: Secondary | ICD-10-CM | POA: Insufficient documentation

## 2020-02-17 DIAGNOSIS — M546 Pain in thoracic spine: Secondary | ICD-10-CM | POA: Diagnosis present

## 2020-02-17 MED ORDER — PREGABALIN 75 MG PO CAPS
75.0000 mg | ORAL_CAPSULE | Freq: Two times a day (BID) | ORAL | 5 refills | Status: DC
Start: 2020-02-17 — End: 2020-11-09

## 2020-02-17 NOTE — Patient Instructions (Signed)
Plan: 1. Emgality- go to website or google coupon with [employer]. Mine is free, so should help  2. Lyrica - use [facility]- can get as low as $20/month- will give PRINTED Rx so can take wherever she wants to get it filled- suggest [facility].   3. Patient here for trigger point injections for  Consent done and on chart.  Cleaned areas with alcohol and injected using a 27 gauge 1.5 inch needle  Injected 7cc Using 1% Lidocaine with no EPI  Upper traps B/L Levators B/L Posterior scalenes B/L x2 Middle scalenes Splenius Capitus Pectoralis Major B/L- twitch responses Rhomboids B/L x2 Infraspinatus L only Teres Major/minor L only Thoracic paraspinals B/L x2 Lumbar paraspinals Other injections-    Patient's level of pain prior was- 5/10 Current level of pain after injections is- 4-5/10-ROM is the same  There was no bleeding or complications.  Patient was advised to drink a lot of water on day after injections to flush system Will have increased soreness for 12-48 hours after injections.  Can use Lidocaine patches the day AFTER injections Can use theracane on day of injections in places didn't inject Can use heating pad/ice 4-6 hours AFTER injections  4. Lidocaine patches- have some over the counter- put on each side of the neck/head when has a migraine you can't kick-   5. F/U in 6 weeks

## 2020-02-17 NOTE — Progress Notes (Signed)
Patient is 44 yr old R handed female with hx of RA (dx'd 2019), breast CA- 2017 dx'd, and Fibromyalgia- secondary to breast cancer TREATMENT-- Sx's since 2019; and here for f/u of L>>>R Thoracic back pain.   CT scan was (-) for recurrence of breast CA.    Did get theracane and tennis balls-  Doing them and definitely seeing improvement.  Slight improvement- ~ 10%  Just started to have the pain come back last week as trigger point injections were starting to wear off.   Didn't get Lyrica. Due to [facility] saying would be $100- We discussed  Insurance won't cover   Plan: 1. Emgality- go to website or google coupon with [employer]. Mine is free, so should help  2. Lyrica - use [facility]- can get as low as $20/month- will give PRINTED Rx so can take wherever she wants to get it filled- suggest [facility].   3. Patient here for trigger point injections for  Consent done and on chart.  Cleaned areas with alcohol and injected using a 27 gauge 1.5 inch needle  Injected 7cc Using 1% Lidocaine with no EPI  Upper traps B/L Levators B/L Posterior scalenes B/L x2 Middle scalenes Splenius Capitus Pectoralis Major B/L- twitch responses Rhomboids B/L x2 Infraspinatus L only Teres Major/minor L only Thoracic paraspinals B/L x2 Lumbar paraspinals Other injections-    Patient's level of pain prior was- 5/10 Current level of pain after injections is- 4-5/10-ROM is the same  There was no bleeding or complications.  Patient was advised to drink a lot of water on day after injections to flush system Will have increased soreness for 12-48 hours after injections.  Can use Lidocaine patches the day AFTER injections Can use theracane on day of injections in places didn't inject Can use heating pad/ice 4-6 hours AFTER injections  4. Lidocaine patches- have some over the counter- put on each side of the neck/head when has a migraine you can't kick-   5. F/U in 6 weeks  I spent a  total of 30 minutes on visit. As detailed above.

## 2020-02-27 ENCOUNTER — Other Ambulatory Visit: Payer: Self-pay | Admitting: Family Medicine

## 2020-02-27 DIAGNOSIS — E669 Obesity, unspecified: Secondary | ICD-10-CM

## 2020-02-27 DIAGNOSIS — Z6837 Body mass index (BMI) 37.0-37.9, adult: Secondary | ICD-10-CM

## 2020-02-28 ENCOUNTER — Encounter: Payer: Self-pay | Admitting: Family Medicine

## 2020-02-28 ENCOUNTER — Other Ambulatory Visit: Payer: Self-pay | Admitting: Family Medicine

## 2020-02-28 DIAGNOSIS — E669 Obesity, unspecified: Secondary | ICD-10-CM

## 2020-02-28 DIAGNOSIS — Z6837 Body mass index (BMI) 37.0-37.9, adult: Secondary | ICD-10-CM

## 2020-02-29 ENCOUNTER — Telehealth: Payer: Self-pay

## 2020-02-29 NOTE — Telephone Encounter (Signed)
PA started on Contrave Key: [id number]

## 2020-02-29 NOTE — Telephone Encounter (Signed)
Medication refill request.  Duplicate

## 2020-02-29 NOTE — Telephone Encounter (Signed)
Medication refill request.   Last filled 02/16/20 // #120 // refills -2  Request declined.

## 2020-02-29 NOTE — Telephone Encounter (Signed)
Yes it would be for the contrave. What does "not eligible" mean? I'm fine for the PA to be initiated

## 2020-03-05 ENCOUNTER — Ambulatory Visit: Payer: Self-pay | Admitting: Obstetrics and Gynecology

## 2020-03-13 ENCOUNTER — Ambulatory Visit: Payer: 59 | Admitting: Oncology

## 2020-03-13 ENCOUNTER — Encounter: Payer: Self-pay | Admitting: Family Medicine

## 2020-03-13 ENCOUNTER — Other Ambulatory Visit: Payer: 59

## 2020-03-13 DIAGNOSIS — M069 Rheumatoid arthritis, unspecified: Secondary | ICD-10-CM

## 2020-03-13 DIAGNOSIS — E669 Obesity, unspecified: Secondary | ICD-10-CM

## 2020-03-13 DIAGNOSIS — Z6837 Body mass index (BMI) 37.0-37.9, adult: Secondary | ICD-10-CM

## 2020-03-14 MED ORDER — SEMAGLUTIDE-WEIGHT MANAGEMENT 0.25 MG/0.5ML ~~LOC~~ SOAJ: 0.2500 mg | SUBCUTANEOUS | 0 refills | Status: DC

## 2020-03-14 MED ORDER — OXYCODONE-ACETAMINOPHEN 5-325 MG PO TABS: 1.0000 | ORAL_TABLET | Freq: Two times a day (BID) | ORAL | 0 refills | Status: DC | PRN

## 2020-03-16 ENCOUNTER — Telehealth: Payer: Self-pay | Admitting: Family Medicine

## 2020-03-16 ENCOUNTER — Encounter: Payer: Self-pay | Admitting: Obstetrics and Gynecology

## 2020-03-16 ENCOUNTER — Encounter: Payer: Self-pay | Admitting: Family Medicine

## 2020-03-16 NOTE — Telephone Encounter (Signed)
We do not have Medicaid on file - I was able to pull up in [employer] by name and DOB. Pt has [employer] plan. I have left a message requesting pt to upload copy of her card to [place]. I called [facility] Medicaid and pt does not have [facility]/Dr. C listed as PCP.   Pt has Dr. Alan Ripper at [facility] listed as PCP & needs to update this.   I have faxed referral for Derm to [facility] as they accept Medicaid plans.

## 2020-03-16 NOTE — Telephone Encounter (Signed)
Patient called back and stated that she was referred to a Dermatologist but they don't accept Medicaid. Patient wanted to see if she can be referred to another location that accepts insurance, please advise. CB is [phone]

## 2020-03-19 ENCOUNTER — Telehealth: Payer: Self-pay

## 2020-03-19 NOTE — Telephone Encounter (Signed)
PA for [facility] 0.25mg /0.5 ml submitted to cover my meds, Key code: [id number].  Waiting on response.   Dm/cma

## 2020-03-20 ENCOUNTER — Telehealth: Payer: Self-pay

## 2020-03-20 NOTE — Telephone Encounter (Signed)
Please call pt to make her aware. Would she like a referral to [facility]? They can offer dietary modifications and recommendations and may also have more ideas about medication options.

## 2020-03-20 NOTE — Telephone Encounter (Signed)
Dr. Loletha Grayer please advise.  PA was denied for [facility]. No suggestions given for insurance.

## 2020-03-21 NOTE — Telephone Encounter (Signed)
Left patient a detailed voicemail per DPR and sent a my chart message as well informing pt of denial and suggestion for the [facility] referral and for pt to call office an let us know.

## 2020-03-30 ENCOUNTER — Telehealth: Payer: Self-pay | Admitting: Family Medicine

## 2020-03-30 NOTE — Telephone Encounter (Signed)
Cover my meds was contacted an they were just wanting to know if we or the patient would like to do an appeal an if so how to pursue one. There is an appeal key on [email or web address].  Pt was notified and asked if she would like a referral to [facility] but we haven't heard anything from pt.

## 2020-03-30 NOTE — Telephone Encounter (Signed)
Logan from cover my meds calling to speak to who handles prior auths for medication for Dr Bryan Lemma, [place]: [phone]. Reference key: [id number]

## 2020-04-02 ENCOUNTER — Ambulatory Visit: Payer: 59 | Admitting: Physical Medicine and Rehabilitation

## 2020-04-13 ENCOUNTER — Other Ambulatory Visit: Payer: Self-pay

## 2020-04-13 DIAGNOSIS — Z170 Estrogen receptor positive status [ER+]: Secondary | ICD-10-CM

## 2020-04-13 DIAGNOSIS — C50412 Malignant neoplasm of upper-outer quadrant of left female breast: Secondary | ICD-10-CM
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
## 2020-04-16 ENCOUNTER — Inpatient Hospital Stay: Payer: 59 | Attending: Oncology

## 2020-04-16 ENCOUNTER — Other Ambulatory Visit: Payer: Self-pay

## 2020-04-16 ENCOUNTER — Inpatient Hospital Stay (HOSPITAL_BASED_OUTPATIENT_CLINIC_OR_DEPARTMENT_OTHER): Payer: 59 | Admitting: Oncology

## 2020-04-16 VITALS — BP 138/92 | HR 96 | Temp 98.0°F | Resp 18 | Ht 64.0 in | Wt 218.8 lb

## 2020-04-16 DIAGNOSIS — Z803 Family history of malignant neoplasm of breast: Secondary | ICD-10-CM | POA: Insufficient documentation

## 2020-04-16 DIAGNOSIS — C50412 Malignant neoplasm of upper-outer quadrant of left female breast: Secondary | ICD-10-CM | POA: Diagnosis not present

## 2020-04-16 DIAGNOSIS — Z808 Family history of malignant neoplasm of other organs or systems: Secondary | ICD-10-CM | POA: Diagnosis not present

## 2020-04-16 DIAGNOSIS — Z853 Personal history of malignant neoplasm of breast: Secondary | ICD-10-CM | POA: Insufficient documentation

## 2020-04-16 DIAGNOSIS — Z79899 Other long term (current) drug therapy: Secondary | ICD-10-CM | POA: Diagnosis not present

## 2020-04-16 DIAGNOSIS — M797 Fibromyalgia: Secondary | ICD-10-CM | POA: Diagnosis not present

## 2020-04-16 DIAGNOSIS — Z800 Family history of malignant neoplasm of digestive organs: Secondary | ICD-10-CM | POA: Diagnosis not present

## 2020-04-16 DIAGNOSIS — Z90710 Acquired absence of both cervix and uterus: Secondary | ICD-10-CM | POA: Insufficient documentation

## 2020-04-16 DIAGNOSIS — M069 Rheumatoid arthritis, unspecified: Secondary | ICD-10-CM | POA: Insufficient documentation

## 2020-04-16 DIAGNOSIS — Z170 Estrogen receptor positive status [ER+]: Secondary | ICD-10-CM

## 2020-04-16 LAB — CBC WITH DIFFERENTIAL (CANCER CENTER ONLY)
Abs Immature Granulocytes: 0.03 10*3/uL (ref 0.00–0.07)
Basophils Absolute: 0 10*3/uL (ref 0.0–0.1)
Basophils Relative: 0 %
Eosinophils Absolute: 0.1 10*3/uL (ref 0.0–0.5)
Eosinophils Relative: 1 %
HCT: 39.2 % (ref 36.0–46.0)
Hemoglobin: 13 g/dL (ref 12.0–15.0)
Immature Granulocytes: 0 %
Lymphocytes Relative: 15 %
Lymphs Abs: 1.6 10*3/uL (ref 0.7–4.0)
MCH: 28.6 pg (ref 26.0–34.0)
MCHC: 33.2 g/dL (ref 30.0–36.0)
MCV: 86.3 fL (ref 80.0–100.0)
Monocytes Absolute: 0.8 10*3/uL (ref 0.1–1.0)
Monocytes Relative: 8 %
Neutro Abs: 8.1 10*3/uL — ABNORMAL HIGH (ref 1.7–7.7)
Neutrophils Relative %: 76 %
Platelet Count: 325 10*3/uL (ref 150–400)
RBC: 4.54 MIL/uL (ref 3.87–5.11)
RDW: 13.2 % (ref 11.5–15.5)
WBC Count: 10.7 10*3/uL — ABNORMAL HIGH (ref 4.0–10.5)
nRBC: 0 % (ref 0.0–0.2)

## 2020-04-16 LAB — CMP (CANCER CENTER ONLY)
ALT: 24 U/L (ref 0–44)
AST: 19 U/L (ref 15–41)
Albumin: 3.8 g/dL (ref 3.5–5.0)
Alkaline Phosphatase: 82 U/L (ref 38–126)
Anion gap: 5 (ref 5–15)
BUN: 10 mg/dL (ref 6–20)
CO2: 27 mmol/L (ref 22–32)
Calcium: 9.5 mg/dL (ref 8.9–10.3)
Chloride: 105 mmol/L (ref 98–111)
Creatinine: 0.87 mg/dL (ref 0.44–1.00)
GFR, Estimated: >60 mL/min (ref >60)
Glucose, Bld: 110 mg/dL — ABNORMAL HIGH (ref 70–99)
Potassium: 4.2 mmol/L (ref 3.5–5.1)
Sodium: 137 mmol/L (ref 135–145)
Total Bilirubin: 0.2 mg/dL — ABNORMAL LOW (ref 0.3–1.2)
Total Protein: 7.8 g/dL (ref 6.5–8.1)

## 2020-04-18 ENCOUNTER — Encounter: Payer: 59 | Admitting: Physical Medicine and Rehabilitation

## 2020-04-18 ENCOUNTER — Telehealth: Payer: Self-pay | Admitting: Oncology

## 2020-04-18 NOTE — Telephone Encounter (Signed)
Scheduled per 10/11 los. Called and spoke with pt, confirmed 5/11 appts

## 2020-05-02 ENCOUNTER — Encounter: Payer: Self-pay | Admitting: Family Medicine

## 2020-05-02 DIAGNOSIS — M069 Rheumatoid arthritis, unspecified: Secondary | ICD-10-CM

## 2020-05-02 MED ORDER — OXYCODONE-ACETAMINOPHEN 5-325 MG PO TABS: 1.0000 | ORAL_TABLET | Freq: Two times a day (BID) | ORAL | 0 refills | Status: DC | PRN

## 2020-05-14 ENCOUNTER — Encounter: Payer: 59 | Attending: Physical Medicine and Rehabilitation | Admitting: Physical Medicine and Rehabilitation

## 2020-05-14 DIAGNOSIS — M069 Rheumatoid arthritis, unspecified: Secondary | ICD-10-CM | POA: Insufficient documentation

## 2020-05-14 DIAGNOSIS — M797 Fibromyalgia: Secondary | ICD-10-CM | POA: Insufficient documentation

## 2020-05-14 DIAGNOSIS — Z8742 Personal history of other diseases of the female genital tract: Secondary | ICD-10-CM | POA: Insufficient documentation

## 2020-05-14 DIAGNOSIS — R102 Pelvic and perineal pain: Secondary | ICD-10-CM | POA: Insufficient documentation

## 2020-05-14 DIAGNOSIS — M546 Pain in thoracic spine: Secondary | ICD-10-CM | POA: Insufficient documentation

## 2020-05-14 DIAGNOSIS — Z853 Personal history of malignant neoplasm of breast: Secondary | ICD-10-CM | POA: Insufficient documentation

## 2020-05-14 DIAGNOSIS — Z90710 Acquired absence of both cervix and uterus: Secondary | ICD-10-CM | POA: Insufficient documentation

## 2020-05-17 ENCOUNTER — Ambulatory Visit: Payer: 59 | Admitting: Family Medicine

## 2020-05-30 ENCOUNTER — Other Ambulatory Visit: Payer: Self-pay | Admitting: Family Medicine

## 2020-05-30 DIAGNOSIS — M069 Rheumatoid arthritis, unspecified: Secondary | ICD-10-CM

## 2020-05-30 DIAGNOSIS — M797 Fibromyalgia: Secondary | ICD-10-CM

## 2020-05-30 DIAGNOSIS — F324 Major depressive disorder, single episode, in partial remission: Secondary | ICD-10-CM

## 2020-05-30 NOTE — Telephone Encounter (Signed)
Last OV 02/16/20 Last fill 02/08/20 #90/1

## 2020-06-06 ENCOUNTER — Encounter: Payer: Self-pay | Admitting: Family Medicine

## 2020-06-28 ENCOUNTER — Other Ambulatory Visit: Payer: Self-pay

## 2020-06-28 ENCOUNTER — Encounter: Payer: Self-pay | Admitting: Family Medicine

## 2020-06-28 ENCOUNTER — Ambulatory Visit (INDEPENDENT_AMBULATORY_CARE_PROVIDER_SITE_OTHER): Payer: Medicare Other | Admitting: Family Medicine

## 2020-06-28 VITALS — BP 124/76 | HR 95 | Temp 97.8°F | Ht 64.0 in | Wt 218.6 lb

## 2020-06-28 DIAGNOSIS — K580 Irritable bowel syndrome with diarrhea: Secondary | ICD-10-CM | POA: Diagnosis not present

## 2020-06-28 DIAGNOSIS — M069 Rheumatoid arthritis, unspecified: Secondary | ICD-10-CM | POA: Diagnosis not present

## 2020-06-28 DIAGNOSIS — F5101 Primary insomnia: Secondary | ICD-10-CM

## 2020-06-28 DIAGNOSIS — R600 Localized edema: Secondary | ICD-10-CM | POA: Diagnosis not present

## 2020-06-28 MED ORDER — OXYCODONE-ACETAMINOPHEN 5-325 MG PO TABS: 1.0000 | ORAL_TABLET | Freq: Two times a day (BID) | ORAL | 0 refills | Status: DC | PRN

## 2020-06-28 MED ORDER — DICYCLOMINE HCL 10 MG PO CAPS: 10.0000 mg | ORAL_CAPSULE | Freq: Three times a day (TID) | ORAL | 3 refills | Status: DC

## 2020-06-28 MED ORDER — ZOLPIDEM TARTRATE 10 MG PO TABS: 10.0000 mg | ORAL_TABLET | Freq: Every evening | ORAL | 0 refills | Status: DC | PRN

## 2020-06-28 MED ORDER — FUROSEMIDE 20 MG PO TABS: 20.0000 mg | ORAL_TABLET | Freq: Every day | ORAL | 3 refills | Status: DC

## 2020-06-28 MED ORDER — PREDNISONE 5 MG PO TABS: 5.0000 mg | ORAL_TABLET | Freq: Every day | ORAL | 3 refills | Status: DC
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
## 2020-07-10 ENCOUNTER — Ambulatory Visit: Payer: 59 | Admitting: Neurology

## 2020-07-11 ENCOUNTER — Encounter: Payer: Self-pay | Admitting: Family Medicine

## 2020-07-24 ENCOUNTER — Other Ambulatory Visit: Payer: Self-pay | Admitting: Family Medicine

## 2020-07-24 DIAGNOSIS — M797 Fibromyalgia: Secondary | ICD-10-CM

## 2020-07-24 NOTE — Telephone Encounter (Signed)
Refill request for  Methocarbamol 500 mg tabs LR 01/26/20, #60, 0 rf LOV 06/28/20 FOV  None scheduled.    Please advise.  Thanks. Dm/cma

## 2020-08-06 ENCOUNTER — Encounter: Payer: Self-pay | Admitting: Family Medicine

## 2020-08-06 DIAGNOSIS — M069 Rheumatoid arthritis, unspecified: Secondary | ICD-10-CM

## 2020-08-06 MED ORDER — OXYCODONE-ACETAMINOPHEN 5-325 MG PO TABS: 1.0000 | ORAL_TABLET | Freq: Two times a day (BID) | ORAL | 0 refills | Status: DC | PRN
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
## 2020-08-06 NOTE — Telephone Encounter (Signed)
Please see message and advise.  Thank you. ° °

## 2020-08-08 ENCOUNTER — Other Ambulatory Visit: Payer: Self-pay

## 2020-08-08 ENCOUNTER — Encounter: Payer: Self-pay | Admitting: Neurology

## 2020-08-08 ENCOUNTER — Ambulatory Visit (INDEPENDENT_AMBULATORY_CARE_PROVIDER_SITE_OTHER): Payer: Medicare Other | Admitting: Neurology

## 2020-08-08 VITALS — BP 163/92 | HR 105 | Ht 64.0 in | Wt 217.0 lb

## 2020-08-08 DIAGNOSIS — G43009 Migraine without aura, not intractable, without status migrainosus: Secondary | ICD-10-CM | POA: Diagnosis not present

## 2020-08-08 DIAGNOSIS — G3184 Mild cognitive impairment, so stated: Secondary | ICD-10-CM

## 2020-08-08 DIAGNOSIS — F331 Major depressive disorder, recurrent, moderate: Secondary | ICD-10-CM | POA: Diagnosis not present

## 2020-08-08 DIAGNOSIS — G4733 Obstructive sleep apnea (adult) (pediatric): Secondary | ICD-10-CM | POA: Diagnosis not present

## 2020-08-08 NOTE — Patient Instructions (Signed)
  1. Emgality monthly 2. Take eletriptan at earliest onset of headache.  May repeat dose once in 2 hours if needed.  Maximum 2 tablets in 24 hours. 3. Limit use of pain relievers to no more than 2 days out of the week.  These medications include acetaminophen, NSAIDs (ibuprofen/Advil/Motrin, naproxen/Aleve, triptans (Imitrex/sumatriptan), Excedrin, and narcotics.  This will help reduce risk of rebound headaches. 4. Be aware of common food triggers:  - Caffeine:  coffee, black tea, cola, Mt. Dew  - Chocolate  - Dairy:  aged cheeses (brie, blue, cheddar, gouda, [place], provolone, [place], Swiss, etc), chocolate milk, buttermilk, sour cream, limit eggs and yogurt  - Nuts, peanut butter  - Alcohol  - Cereals/grains:  FRESH breads (fresh bagels, sourdough, doughnuts), yeast productions  - Processed/canned/aged/cured meats (pre-packaged deli meats, hotdogs)  - MSG/glutamate:  soy sauce, flavor enhancer, pickled/preserved/marinated foods  - Sweeteners:  aspartame (Equal, Nutrasweet).  Sugar and Splenda are okay  - Vegetables:  legumes (lima beans, lentils, snow peas, fava beans, pinto peans, peas, garbanzo beans), sauerkraut, onions, olives, pickles  - Fruit:  avocados, bananas, citrus fruit (orange, lemon, grapefruit), mango  - Other:  Frozen meals, macaroni and cheese 5. Routine exercise 6. Stay adequately hydrated (aim for 64 oz water daily) 7. Keep headache diary 8. Maintain proper stress management 9. Maintain proper sleep hygiene 10. Do not skip meals 11. Consider supplements:  magnesium citrate 400mg  daily, riboflavin 400mg  daily, coenzyme Q10 300mg  daily.

## 2020-08-11 IMAGING — MG MM DIGITAL DIAGNOSTIC UNILAT*L* W/ TOMO W/ CAD
4 series · 4 of 12 positions shown · non-contrast
Comparison: Previous exam(s).

CLINICAL DATA: History of reduction mammoplasty. History of left
breast cancer in 9760 status post lumpectomy and radiation. Patient
presents with new sensation of tugging and fullness in the left
breast.

EXAM:
DIGITAL DIAGNOSTIC UNILATERAL LEFT MAMMOGRAM WITH CAD AND TOMO

[L CC synth-2D]
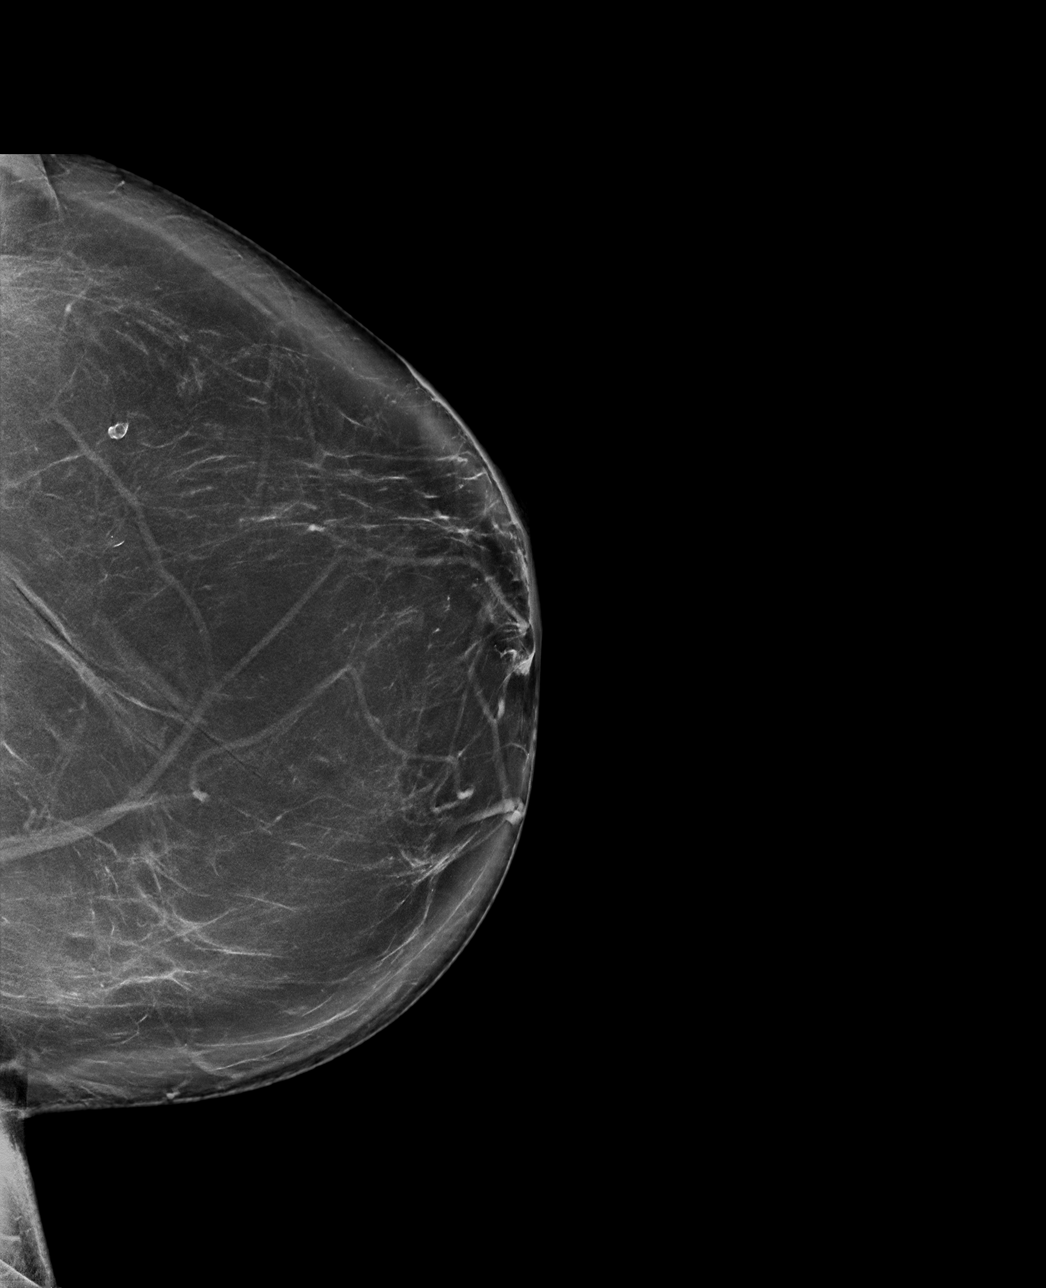

[L MLO synth-2D]
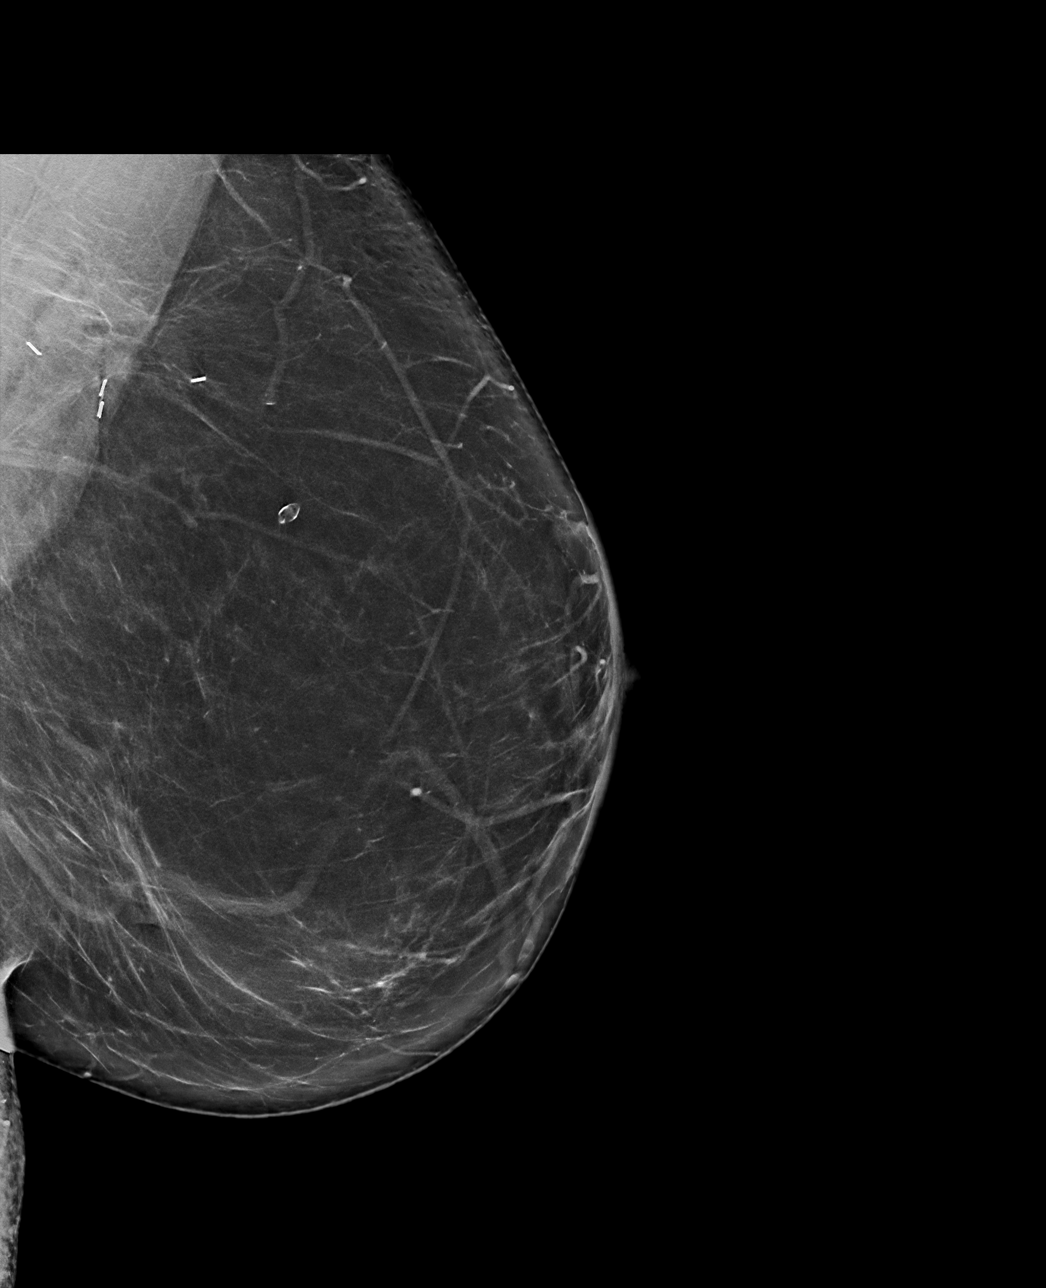

[L CC tomo · tomo slice 51/101.0]
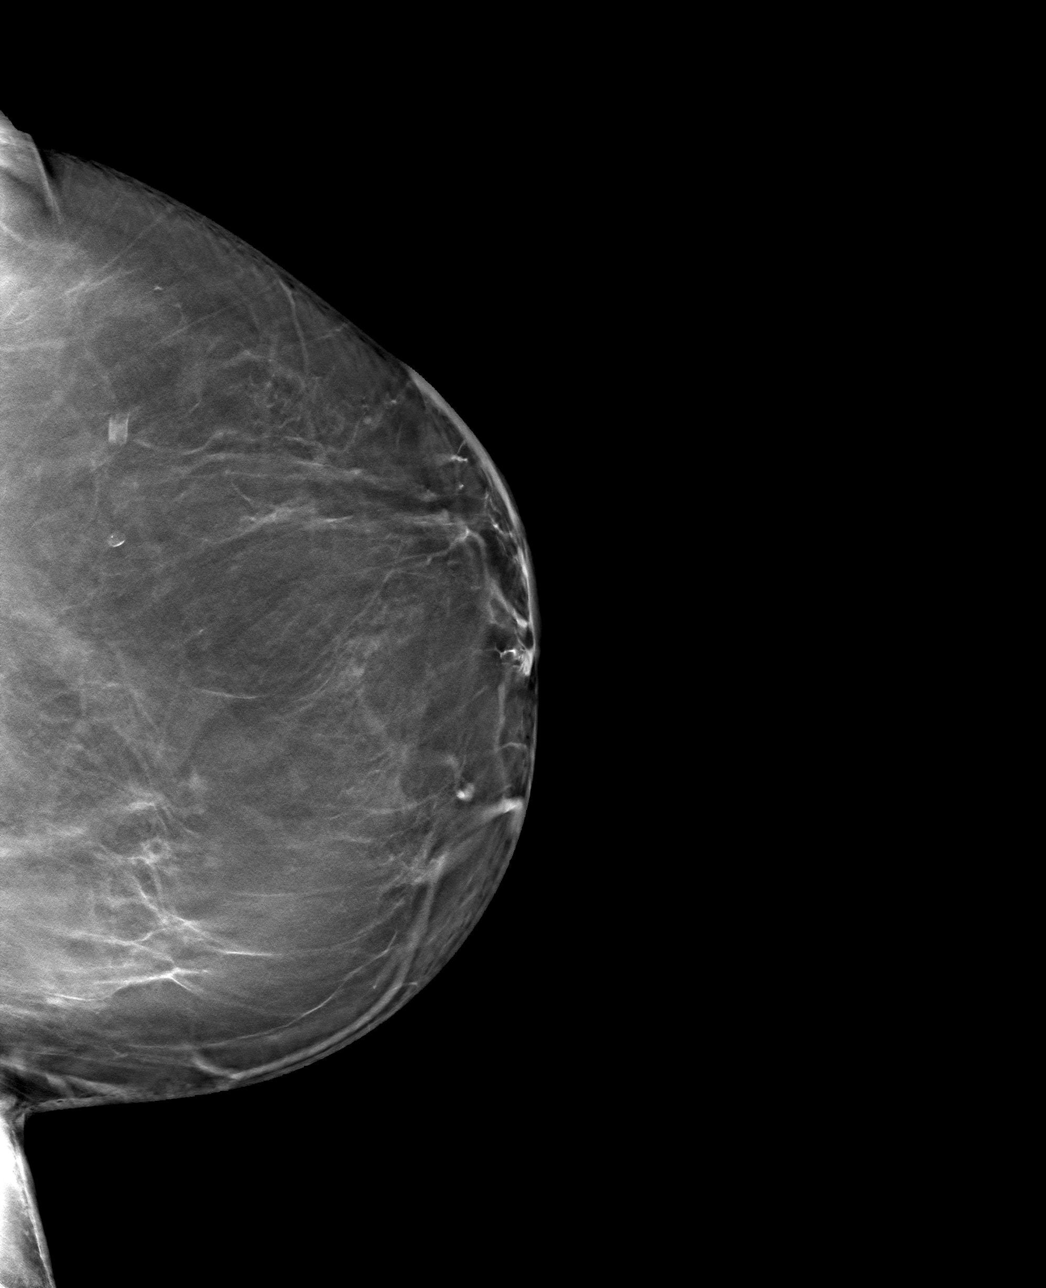

[L MLO tomo · tomo slice 45/90.0]
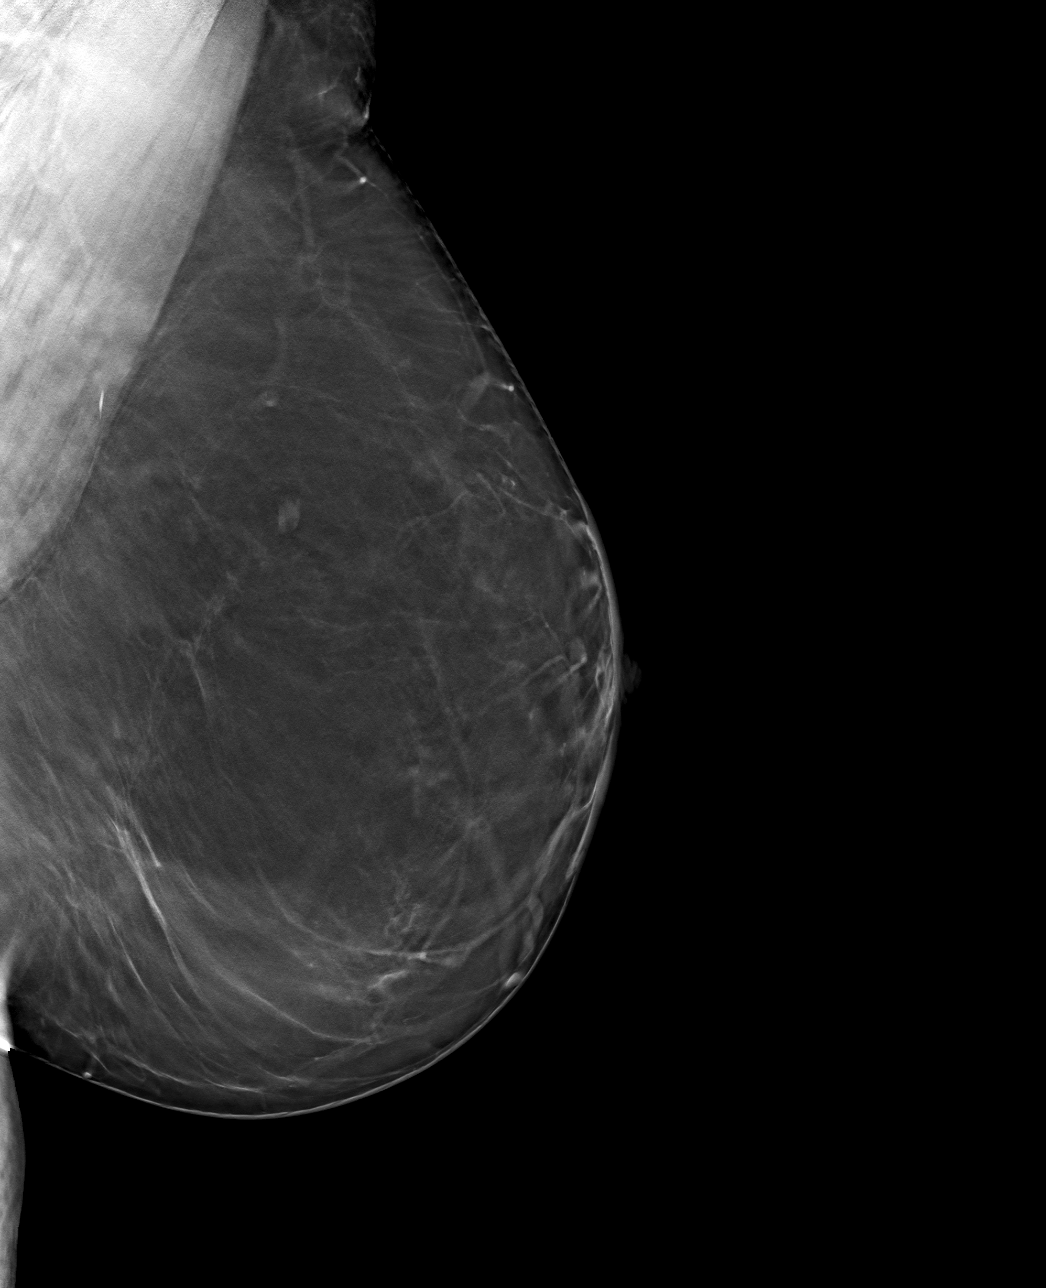

[4 of 12 positions shown; findings below may reference images not displayed]

ACR Breast Density Category b: There are scattered areas of
fibroglandular density.
FINDINGS: No suspicious mass, distortion, or microcalcifications are
identified to suggest presence of malignancy in the left breast.
There are stable postsurgical changes with architectural distortion
in the posterior upper outer aspect of the left breast. No
significant change from prior.

Mammographic images were processed with CAD.
IMPRESSION: No mammographic evidence of malignancy in the left breast. Stable
postsurgical changes.

RECOMMENDATION:
Return for annual bilateral diagnostic mammogram which is due next
month.

I have discussed the findings and recommendations with the patient.
If applicable, a reminder letter will be sent to the patient
regarding the next appointment.

BI-RADS CATEGORY  2: Benign.

## 2020-08-27 ENCOUNTER — Telehealth: Payer: Self-pay | Admitting: *Deleted

## 2020-08-27 ENCOUNTER — Telehealth: Payer: Self-pay | Admitting: Oncology

## 2020-08-27 NOTE — Telephone Encounter (Signed)
Scheduled appt per 2/21 incoming call from pt experiencing symptoms. Transferred pt to desk nurse to see if we need to get pt in earlier.

## 2020-08-27 NOTE — Telephone Encounter (Signed)
Pt reports she has been having pain in the left side of her chest, breast bone. My ribs are sore, painful to touch. At times it feels like stabbing pain.   Message to scheduler.

## 2020-09-03 ENCOUNTER — Other Ambulatory Visit: Payer: Self-pay | Admitting: Family Medicine

## 2020-09-03 DIAGNOSIS — F324 Major depressive disorder, single episode, in partial remission: Secondary | ICD-10-CM

## 2020-09-03 DIAGNOSIS — M797 Fibromyalgia: Secondary | ICD-10-CM

## 2020-09-03 DIAGNOSIS — M069 Rheumatoid arthritis, unspecified: Secondary | ICD-10-CM

## 2020-09-03 NOTE — Telephone Encounter (Signed)
Last OV 06/28/20 Last fill for Mobic 11/02/19 #90/1 Last fill for Effexor 05/30/20 #30/2

## 2020-09-04 ENCOUNTER — Encounter: Payer: Self-pay | Admitting: Family Medicine

## 2020-09-04 ENCOUNTER — Other Ambulatory Visit: Payer: Self-pay | Admitting: Family Medicine

## 2020-09-04 DIAGNOSIS — M069 Rheumatoid arthritis, unspecified: Secondary | ICD-10-CM

## 2020-09-04 DIAGNOSIS — M797 Fibromyalgia: Secondary | ICD-10-CM

## 2020-09-04 DIAGNOSIS — F324 Major depressive disorder, single episode, in partial remission: Secondary | ICD-10-CM

## 2020-09-04 NOTE — Telephone Encounter (Signed)
Duplicate, sent to the provider, via Mychart.

## 2020-09-04 NOTE — Telephone Encounter (Signed)
Pt pharmacy called to follow up on this and I made them aware that Dr Bryan Lemma will be back tomorrow and will be looked at then. They stated pt has 2 days worth left

## 2020-09-04 NOTE — Telephone Encounter (Signed)
Last OV 06/28/20 Last fill 05/30/20  #30/2

## 2020-09-05 ENCOUNTER — Encounter: Payer: Self-pay | Admitting: Family Medicine
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
## 2020-09-06 ENCOUNTER — Inpatient Hospital Stay: Payer: Medicare Other | Attending: Oncology | Admitting: Oncology

## 2020-09-06 ENCOUNTER — Other Ambulatory Visit: Payer: Self-pay

## 2020-09-06 VITALS — BP 164/81 | HR 95 | Temp 97.5°F | Resp 18 | Ht 64.0 in | Wt 222.9 lb

## 2020-09-06 DIAGNOSIS — Z923 Personal history of irradiation: Secondary | ICD-10-CM | POA: Diagnosis not present

## 2020-09-06 DIAGNOSIS — Z853 Personal history of malignant neoplasm of breast: Secondary | ICD-10-CM | POA: Diagnosis present

## 2020-09-06 DIAGNOSIS — C50412 Malignant neoplasm of upper-outer quadrant of left female breast: Secondary | ICD-10-CM | POA: Diagnosis not present

## 2020-09-06 DIAGNOSIS — Z9221 Personal history of antineoplastic chemotherapy: Secondary | ICD-10-CM | POA: Insufficient documentation

## 2020-09-10 ENCOUNTER — Encounter: Payer: Medicare Other | Admitting: Physical Medicine and Rehabilitation

## 2020-09-10 ENCOUNTER — Telehealth: Payer: Self-pay | Admitting: Oncology

## 2020-09-10 NOTE — Telephone Encounter (Signed)
Made no changes to pt's schedule based off of 3/3 los.

## 2020-09-11 ENCOUNTER — Encounter: Payer: Self-pay | Admitting: Family Medicine

## 2020-09-11 DIAGNOSIS — M069 Rheumatoid arthritis, unspecified: Secondary | ICD-10-CM

## 2020-09-12 MED ORDER — OXYCODONE-ACETAMINOPHEN 5-325 MG PO TABS: 1.0000 | ORAL_TABLET | Freq: Two times a day (BID) | ORAL | 0 refills | Status: DC | PRN

## 2020-09-12 NOTE — Telephone Encounter (Signed)
Patient is calling regarding her Oxycodone refill. States that she has been in pain all day and would like to have this filled today. Please call her back at [phone] if you have any questions.

## 2020-09-12 NOTE — Telephone Encounter (Signed)
Rx sent 

## 2020-09-15 IMAGING — DX DG CHEST 1V PORT
1 series · 1 of 1 positions shown · non-contrast
Comparison: 10/21/2016

CLINICAL DATA: Headache and nausea

EXAM:
PORTABLE CHEST 1 VIEW

[chest ap]
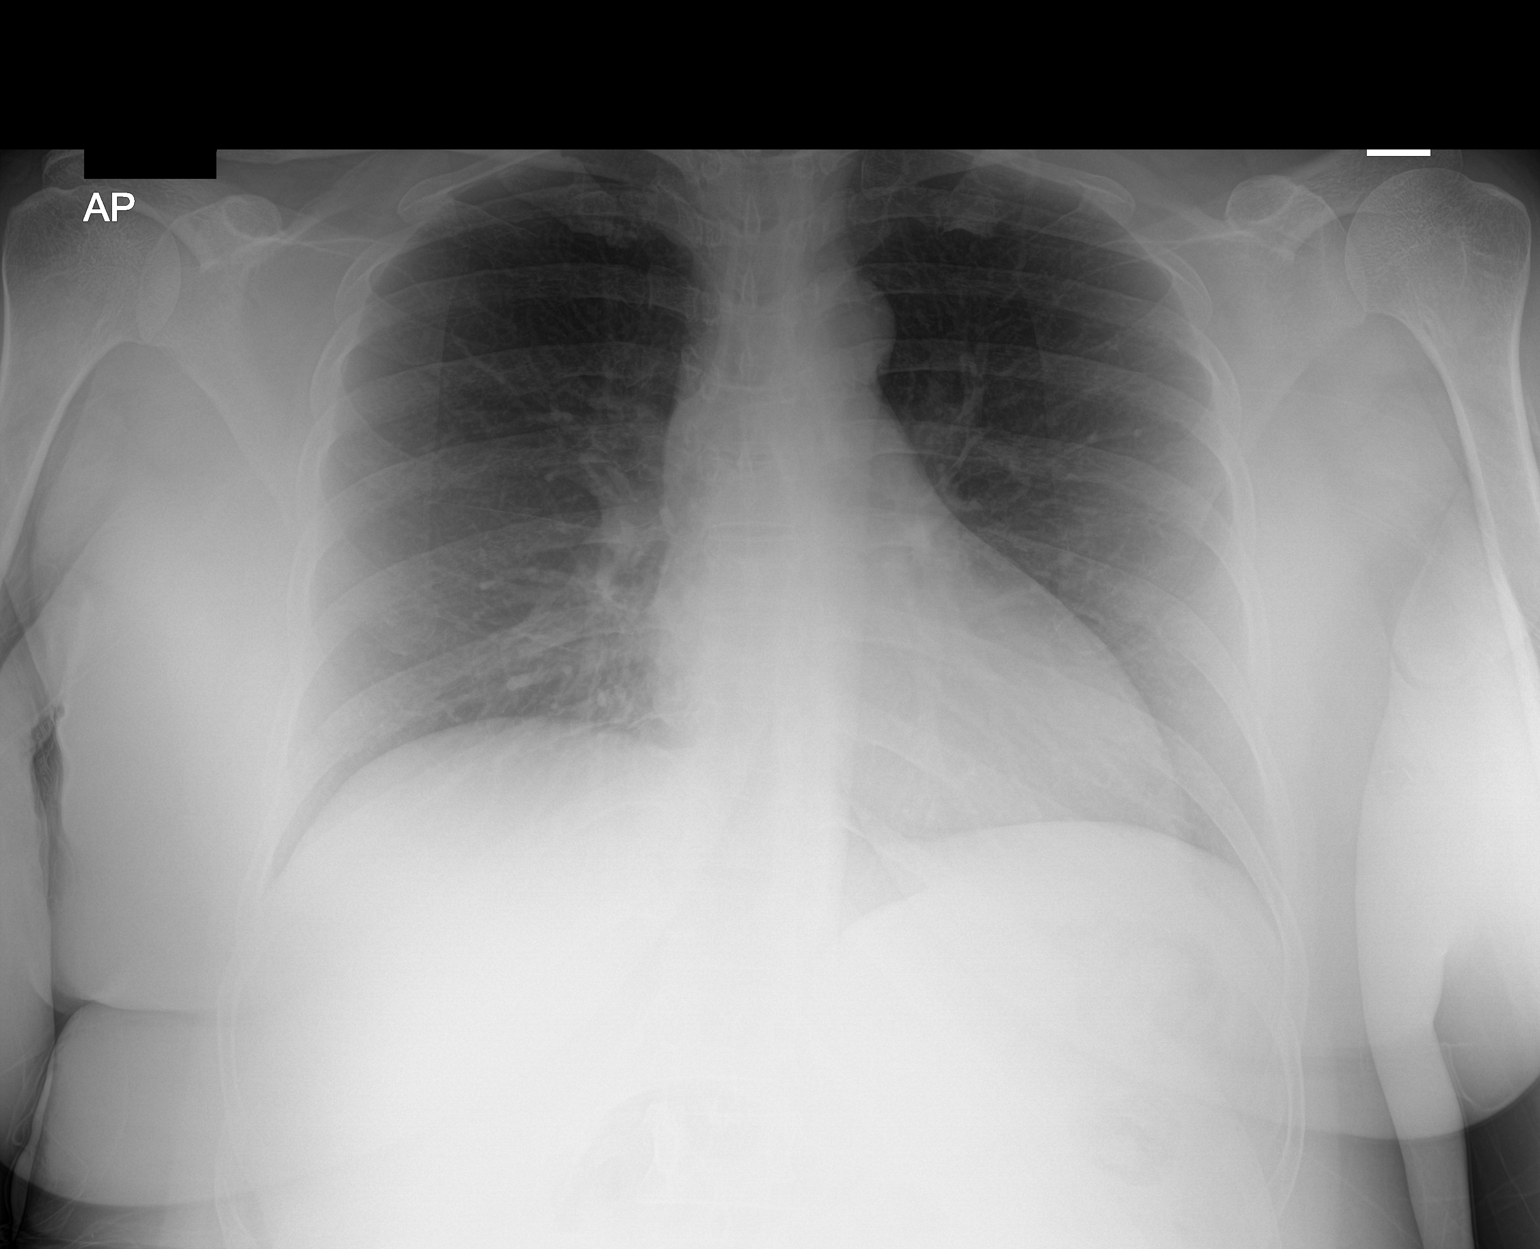

[1 of 1 positions shown; findings below may reference images not displayed]

FINDINGS: Port has been removed. There is no edema, consolidation, effusion,
or pneumothorax. Normal heart size and mediastinal contours.
IMPRESSION: Negative chest.

## 2020-09-17 ENCOUNTER — Inpatient Hospital Stay: Payer: Medicare Other | Admitting: Oncology

## 2020-09-26 ENCOUNTER — Encounter: Payer: Self-pay | Admitting: Neurology

## 2020-10-09 ENCOUNTER — Telehealth: Payer: Self-pay | Admitting: Family Medicine

## 2020-10-09 NOTE — Telephone Encounter (Signed)
Pt is wanting to start weaning off her

## 2020-10-12 ENCOUNTER — Encounter: Payer: Self-pay | Admitting: Family Medicine

## 2020-10-12 DIAGNOSIS — M069 Rheumatoid arthritis, unspecified: Secondary | ICD-10-CM

## 2020-10-12 MED ORDER — OXYCODONE-ACETAMINOPHEN 5-325 MG PO TABS
1.0000 | ORAL_TABLET | Freq: Two times a day (BID) | ORAL | 0 refills | Status: DC | PRN
Start: 2020-10-12 — End: 2020-11-14

## 2020-10-16 ENCOUNTER — Other Ambulatory Visit: Payer: Self-pay

## 2020-10-16 ENCOUNTER — Ambulatory Visit (HOSPITAL_COMMUNITY)
Admission: RE | Admit: 2020-10-16 | Discharge: 2020-10-16 | Disposition: A | Discharge status: Home / Self Care | Payer: Medicare Other | Source: Ambulatory Visit | Attending: Oncology | Admitting: Oncology

## 2020-10-16 DIAGNOSIS — C50412 Malignant neoplasm of upper-outer quadrant of left female breast: Secondary | ICD-10-CM | POA: Diagnosis present

## 2020-10-16 MED ORDER — IOHEXOL 300 MG/ML  SOLN
75.0000 mL | Freq: Once | INTRAMUSCULAR | Status: AC | PRN
  Administered 2020-10-16: 75 mL via INTRAVENOUS

## 2020-10-18 ENCOUNTER — Telehealth: Payer: Self-pay

## 2020-10-18 NOTE — Telephone Encounter (Signed)
Pt called for clarification of CT results. Results explained to pt. She verbalized thanks and understanding. We will see her 11/14/20

## 2020-10-22 ENCOUNTER — Other Ambulatory Visit: Payer: Self-pay | Admitting: Family Medicine

## 2020-10-22 NOTE — Telephone Encounter (Signed)
Last OV 06/28/20 Last fill 06/28/20  #30/3

## 2020-10-23 ENCOUNTER — Encounter: Payer: Self-pay | Admitting: Family Medicine

## 2020-10-23 DIAGNOSIS — D229 Melanocytic nevi, unspecified: Secondary | ICD-10-CM

## 2020-10-23 NOTE — Telephone Encounter (Signed)
Please see message . Thank you .

## 2020-10-24 ENCOUNTER — Other Ambulatory Visit: Payer: Self-pay | Admitting: Family Medicine

## 2020-10-31 ENCOUNTER — Telehealth: Payer: Self-pay | Admitting: Oncology

## 2020-10-31 NOTE — Telephone Encounter (Signed)
Sch per 4/26 sch mg, Patient aware

## 2020-11-09 ENCOUNTER — Inpatient Hospital Stay: Payer: Medicare Other | Attending: Oncology

## 2020-11-09 ENCOUNTER — Inpatient Hospital Stay (HOSPITAL_BASED_OUTPATIENT_CLINIC_OR_DEPARTMENT_OTHER): Payer: Medicare Other | Admitting: Adult Health

## 2020-11-09 ENCOUNTER — Encounter: Payer: Self-pay | Admitting: Family Medicine

## 2020-11-09 ENCOUNTER — Ambulatory Visit (INDEPENDENT_AMBULATORY_CARE_PROVIDER_SITE_OTHER): Payer: Medicare Other | Admitting: Family Medicine

## 2020-11-09 ENCOUNTER — Encounter: Payer: Self-pay | Admitting: Adult Health

## 2020-11-09 ENCOUNTER — Other Ambulatory Visit: Payer: Self-pay

## 2020-11-09 ENCOUNTER — Other Ambulatory Visit: Payer: Self-pay | Admitting: *Deleted

## 2020-11-09 VITALS — BP 138/74 | HR 90 | Temp 97.5°F | Resp 18 | Ht 64.0 in | Wt 220.9 lb

## 2020-11-09 VITALS — BP 138/90 | HR 90 | Temp 98.0°F | Ht 64.0 in | Wt 219.6 lb

## 2020-11-09 DIAGNOSIS — M797 Fibromyalgia: Secondary | ICD-10-CM | POA: Insufficient documentation

## 2020-11-09 DIAGNOSIS — Z7952 Long term (current) use of systemic steroids: Secondary | ICD-10-CM | POA: Insufficient documentation

## 2020-11-09 DIAGNOSIS — M940 Chondrocostal junction syndrome [Tietze]: Secondary | ICD-10-CM

## 2020-11-09 DIAGNOSIS — R5383 Other fatigue: Secondary | ICD-10-CM

## 2020-11-09 DIAGNOSIS — M1990 Unspecified osteoarthritis, unspecified site: Secondary | ICD-10-CM | POA: Diagnosis not present

## 2020-11-09 DIAGNOSIS — Z853 Personal history of malignant neoplasm of breast: Secondary | ICD-10-CM | POA: Insufficient documentation

## 2020-11-09 DIAGNOSIS — E559 Vitamin D deficiency, unspecified: Secondary | ICD-10-CM

## 2020-11-09 DIAGNOSIS — C50412 Malignant neoplasm of upper-outer quadrant of left female breast: Secondary | ICD-10-CM

## 2020-11-09 DIAGNOSIS — D229 Melanocytic nevi, unspecified: Secondary | ICD-10-CM

## 2020-11-09 DIAGNOSIS — Z90710 Acquired absence of both cervix and uterus: Secondary | ICD-10-CM | POA: Insufficient documentation

## 2020-11-09 LAB — CMP (CANCER CENTER ONLY)
ALT: 30 U/L (ref 0–44)
AST: 21 U/L (ref 15–41)
Albumin: 4 g/dL (ref 3.5–5.0)
Alkaline Phosphatase: 97 U/L (ref 38–126)
Anion gap: 10 (ref 5–15)
BUN: 14 mg/dL (ref 6–20)
CO2: 25 mmol/L (ref 22–32)
Calcium: 9.5 mg/dL (ref 8.9–10.3)
Chloride: 103 mmol/L (ref 98–111)
Creatinine: 0.85 mg/dL (ref 0.44–1.00)
GFR, Estimated: >60 mL/min (ref >60)
Glucose, Bld: 133 mg/dL — ABNORMAL HIGH (ref 70–99)
Potassium: 3.5 mmol/L (ref 3.5–5.1)
Sodium: 138 mmol/L (ref 135–145)
Total Bilirubin: <0.2 mg/dL — ABNORMAL LOW (ref 0.3–1.2)
Total Protein: 7.8 g/dL (ref 6.5–8.1)

## 2020-11-09 LAB — CBC WITH DIFFERENTIAL (CANCER CENTER ONLY)
Abs Immature Granulocytes: 0.04 10*3/uL (ref 0.00–0.07)
Basophils Absolute: 0 10*3/uL (ref 0.0–0.1)
Basophils Relative: 0 %
Eosinophils Absolute: 0.3 10*3/uL (ref 0.0–0.5)
Eosinophils Relative: 3 %
HCT: 35.3 % — ABNORMAL LOW (ref 36.0–46.0)
Hemoglobin: 12.3 g/dL (ref 12.0–15.0)
Immature Granulocytes: 0 %
Lymphocytes Relative: 23 %
Lymphs Abs: 2.2 10*3/uL (ref 0.7–4.0)
MCH: 30 pg (ref 26.0–34.0)
MCHC: 34.8 g/dL (ref 30.0–36.0)
MCV: 86.1 fL (ref 80.0–100.0)
Monocytes Absolute: 0.7 10*3/uL (ref 0.1–1.0)
Monocytes Relative: 7 %
Neutro Abs: 6.2 10*3/uL (ref 1.7–7.7)
Neutrophils Relative %: 67 %
Platelet Count: 318 10*3/uL (ref 150–400)
RBC: 4.1 MIL/uL (ref 3.87–5.11)
RDW: 12.9 % (ref 11.5–15.5)
WBC Count: 9.4 10*3/uL (ref 4.0–10.5)
nRBC: 0 % (ref 0.0–0.2)

## 2020-11-09 LAB — VITAMIN D 25 HYDROXY (VIT D DEFICIENCY, FRACTURES): Vit D, 25-Hydroxy: 24.43 ng/mL — ABNORMAL LOW (ref 30–100)

## 2020-11-09 MED ORDER — NABUMETONE 750 MG PO TABS: 750.0000 mg | ORAL_TABLET | Freq: Two times a day (BID) | ORAL | 0 refills | Status: DC
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
## 2020-11-09 NOTE — Progress Notes (Signed)
I connected by phone with [name] on 11/09/2020, 4:30 PM to discuss the potential use of a new treatment, tixagevimab/cilgavimab, for pre-exposure prophylaxis for prevention of coronavirus disease 2019 (COVID-19) caused by the SARS-CoV-2 virus.  This patient is a 45 y.o. female that meets the FDA criteria for Emergency Use Authorization of tixagevimab/cilgavimab for pre-exposure prophylaxis of COVID-19 disease. Pt meets following criteria:  Age >12 yr and weight > 40kg  Not currently infected with SARS-CoV-2 and has no known recent exposure to an individual infected with SARS-CoV-2 AND o Who has moderate to severe immune compromise due to a medical condition or receipt of immunosuppressive medications or treatments and may not mount an adequate immune response to COVID-19 vaccination or  o Vaccination with any available COVID-19 vaccine, according to the approved or authorized schedule, is not recommended due to a history of severe adverse reaction (e.g., severe allergic reaction) to a COVID-19 vaccine(s) and/or COVID-19 vaccine component(s).  o Patient meets the following definition of mod-severe immune compromised status: 8. Other groups not previously mentioned: 4. rheumatoid arthritis on treatment  I have spoken and communicated the following to the patient or parent/caregiver regarding COVID monoclonal antibody treatment:  1. FDA has authorized the emergency use of tixagevimab/cilgavimab for the pre-exposure prophylaxis of COVID-19 in patients with moderate-severe immunocompromised status, who meet above EUA criteria.  2. The significant known and potential risks and benefits of COVID monoclonal antibody, and the extent to which such potential risks and benefits are unknown.  3. Information on available alternative treatments and the risks and benefits of those alternatives, including clinical trials.  4. The patient or parent/caregiver has the option to accept or refuse COVID  monoclonal antibody treatment.  After reviewing this information with the patient, agree to receive tixagevimab/cilgavimab  Scot Dock, NP, 11/09/2020, 4:30 PM

## 2020-11-09 NOTE — Patient Instructions (Signed)
Costochondritis  Costochondritis is inflammation of the tissue (cartilage) that connects the ribs to the breastbone (sternum). This causes pain in the front of the chest. The pain usually starts slowly and involves more than one rib. What are the causes? The exact cause of this condition is not always known. It results from stress on the cartilage where your ribs attach to your sternum. The cause of this stress could be:  Chest injury.  Exercise or activity, such as lifting.  Severe coughing. What increases the risk? You are more likely to develop this condition if you:  Are female.  Are 45-42 years old.  Recently started a new exercise or work activity.  Have low levels of vitamin D.  Have a condition that makes you cough frequently. What are the signs or symptoms? The main symptom of this condition is chest pain. The pain:  Usually starts gradually and can be sharp or dull.  Gets worse with deep breathing, coughing, or exercise.  Gets better with rest.  May be worse when you press on the affected area of your ribs and sternum. How is this diagnosed? This condition is diagnosed based on your symptoms, your medical history, and a physical exam. Your health care provider will check for pain when pressing on your sternum. You may also have tests to rule out other causes of chest pain. These may include:  A chest X-ray to check for lung problems.  An ECG (electrocardiogram) to see if you have a heart problem that could be causing the pain.  An imaging scan to rule out a chest or rib fracture. How is this treated? This condition usually goes away on its own over time. Your health care provider may prescribe an NSAID, such as ibuprofen, to reduce pain and inflammation. Treatment may also include:  Resting and avoiding activities that make pain worse.  Applying heat or ice to the area to reduce pain and inflammation.  Doing exercises to stretch your chest muscles. If these  treatments do not help, your health care provider may inject a numbing medicine at the sternum-rib connection to help relieve the pain. Follow these instructions at home: Managing pain, stiffness, and swelling  If directed, put ice on the painful area. To do this: ? Put ice in a plastic bag. ? Place a towel between your skin and the bag. ? Leave the ice on for 20 minutes, 2-3 times a day.  If directed, apply heat to the affected area as often as told by your health care provider. Use the heat source that your health care provider recommends, such as a moist heat pack or a heating pad. ? Place a towel between your skin and the heat source. ? Leave the heat on for 20-30 minutes. ? Remove the heat if your skin turns bright red. This is especially important if you are unable to feel pain, heat, or cold. You may have a greater risk of getting burned.      Activity  Rest as told by your health care provider. Avoid activities that make pain worse. This includes any activities that use chest, abdominal, and side muscles.  Do not lift anything that is heavier than 10 lb (4.5 kg), or the limit that you are told, until your health care provider says that it is safe.  Return to your normal activities as told by your health care provider. Ask your health care provider what activities are safe for you. General instructions  Take over-the-counter and prescription medicines  only as told by your health care provider.  Keep all follow-up visits as told by your health care provider. This is important. Contact a health care provider if:  You have chills or a fever.  Your pain does not go away or it gets worse.  You have a cough that does not go away. Get help right away if:  You have shortness of breath.  You have severe chest pain that is not relieved by medicines, heat, or ice. These symptoms may represent a serious problem that is an emergency. Do not wait to see if the symptoms will go away.  Get medical help right away. Call your local emergency services (911 in the [place] Do not drive yourself to the hospital.  Summary  Costochondritis is inflammation of the tissue (cartilage) that connects the ribs to the breastbone (sternum).  This condition causes pain in the front of the chest.  Costochondritis results from stress on the cartilage where your ribs attach to your sternum.  Treatment may include medicines, rest, heat or ice, and exercises. This information is not intended to replace advice given to you by your health care provider. Make sure you discuss any questions you have with your health care provider. Document Revised: 05/06/2019 Document Reviewed: 05/06/2019 [employer] Patient Education  2021 [employer]  
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
## 2020-11-12 ENCOUNTER — Encounter: Payer: Self-pay | Admitting: Family Medicine

## 2020-11-12 DIAGNOSIS — M069 Rheumatoid arthritis, unspecified: Secondary | ICD-10-CM

## 2020-11-14 ENCOUNTER — Ambulatory Visit: Payer: Medicare Other | Admitting: Family Medicine

## 2020-11-14 ENCOUNTER — Other Ambulatory Visit: Payer: 59

## 2020-11-14 ENCOUNTER — Inpatient Hospital Stay: Payer: 59 | Admitting: Oncology

## 2020-11-14 MED ORDER — OXYCODONE-ACETAMINOPHEN 5-325 MG PO TABS: 1.0000 | ORAL_TABLET | Freq: Two times a day (BID) | ORAL | 0 refills | Status: DC | PRN

## 2020-11-14 NOTE — Addendum Note (Signed)
Addended by: Darral Dash on: 11/14/2020 11:18 AM   Modules accepted: Orders

## 2020-11-14 NOTE — Telephone Encounter (Signed)
Last OV 11/09/20 Last fill 10/12/20  #60/0

## 2020-11-22 ENCOUNTER — Other Ambulatory Visit: Payer: Self-pay | Admitting: Family Medicine

## 2020-11-22 DIAGNOSIS — E559 Vitamin D deficiency, unspecified: Secondary | ICD-10-CM

## 2020-11-22 MED ORDER — VITAMIN D (ERGOCALCIFEROL) 1.25 MG (50000 UNIT) PO CAPS: 50000.0000 [IU] | ORAL_CAPSULE | ORAL | 2 refills | Status: DC

## 2020-12-10 ENCOUNTER — Other Ambulatory Visit: Payer: Self-pay

## 2020-12-10 ENCOUNTER — Ambulatory Visit (INDEPENDENT_AMBULATORY_CARE_PROVIDER_SITE_OTHER): Payer: Medicare Other

## 2020-12-10 VITALS — BP 129/88 | HR 80 | Temp 98.2°F | Resp 18

## 2020-12-10 DIAGNOSIS — C50412 Malignant neoplasm of upper-outer quadrant of left female breast: Secondary | ICD-10-CM | POA: Diagnosis not present

## 2020-12-10 DIAGNOSIS — Z298 Encounter for other specified prophylactic measures: Secondary | ICD-10-CM | POA: Diagnosis not present

## 2020-12-10 MED ORDER — ALBUTEROL SULFATE HFA 108 (90 BASE) MCG/ACT IN AERS
2.0000 | INHALATION_SPRAY | Freq: Once | RESPIRATORY_TRACT | Status: DC | PRN
Start: 2020-12-10 — End: 2021-05-02

## 2020-12-10 MED ORDER — TIXAGEVIMAB (PART OF EVUSHELD) INJECTION
300.0000 mg | Freq: Once | INTRAMUSCULAR | Status: AC
Start: 2020-12-10 — End: 2020-12-10
  Administered 2020-12-10: 300 mg via INTRAMUSCULAR
  Filled 2020-12-10: qty 3

## 2020-12-10 MED ORDER — CILGAVIMAB (PART OF EVUSHELD) INJECTION
300.0000 mg | Freq: Once | INTRAMUSCULAR | Status: AC
  Administered 2020-12-10: 300 mg via INTRAMUSCULAR
  Filled 2020-12-10: qty 3

## 2020-12-10 MED ORDER — EPINEPHRINE 0.3 MG/0.3ML IJ SOAJ: 0.3000 mg | Freq: Once | INTRAMUSCULAR | Status: DC | PRN

## 2020-12-10 MED ORDER — DIPHENHYDRAMINE HCL 50 MG/ML IJ SOLN: 50.0000 mg | Freq: Once | INTRAMUSCULAR | Status: DC | PRN

## 2020-12-10 MED ORDER — METHYLPREDNISOLONE SODIUM SUCC 125 MG IJ SOLR: 125.0000 mg | Freq: Once | INTRAMUSCULAR | Status: DC | PRN

## 2020-12-10 NOTE — Progress Notes (Signed)
Diagnosis: Immunocompromised  Provider:  Marshell Garfinkel, MD  Procedure: Injection  Evusheld, Dose: 600mg , Site: intramuscular 300mg  x1 left buttock, 300mg  x1 right buttock  Discharge: Condition: Good, Destination: Home . AVS provided to patient.   Performed by:  Koren Shiver, RN

## 2020-12-20 ENCOUNTER — Other Ambulatory Visit: Payer: Self-pay | Admitting: Family Medicine

## 2020-12-20 DIAGNOSIS — M069 Rheumatoid arthritis, unspecified: Secondary | ICD-10-CM

## 2020-12-20 MED ORDER — OXYCODONE-ACETAMINOPHEN 5-325 MG PO TABS: 1.0000 | ORAL_TABLET | Freq: Two times a day (BID) | ORAL | 0 refills | Status: DC | PRN

## 2020-12-20 NOTE — Telephone Encounter (Signed)
Refill request for:  Oxycodone/Acet 5/325 mg LR 11/14/20, #60, 0 rf LOV 11/09/20 FOV none scheduled.    Please review and advise.  Thanks. Dm/cma

## 2020-12-29 ENCOUNTER — Other Ambulatory Visit: Payer: Self-pay | Admitting: Family Medicine

## 2020-12-29 DIAGNOSIS — K580 Irritable bowel syndrome with diarrhea: Secondary | ICD-10-CM

## 2020-12-29 DIAGNOSIS — M940 Chondrocostal junction syndrome [Tietze]: Secondary | ICD-10-CM

## 2021-01-01 NOTE — Telephone Encounter (Signed)
Last VV 11/09/20 Last fill for Nabumetone 11/09/20  #60/0 Last fill for Bentyl 06/18/20 #120/3

## 2021-01-21 ENCOUNTER — Other Ambulatory Visit: Payer: Self-pay | Admitting: Family Medicine

## 2021-01-21 DIAGNOSIS — M069 Rheumatoid arthritis, unspecified: Secondary | ICD-10-CM

## 2021-01-23 LAB — BASIC METABOLIC PANEL
BUN: 9 (ref 4–21)
CO2: 27 — AB (ref 13–22)
Creatinine: 0.7 (ref 0.5–1.1)
Glucose: 85
Potassium: 4.4 (ref 3.4–5.3)
Sodium: 138 (ref 137–147)

## 2021-01-23 LAB — HEPATIC FUNCTION PANEL
ALT: 80 — AB (ref 7–35)
AST: 56 — AB (ref 13–35)
Alkaline Phosphatase: 99 (ref 25–125)
Bilirubin, Total: 0.2

## 2021-01-23 LAB — CBC: RBC: 4.26 (ref 3.87–5.11)

## 2021-01-23 LAB — COMPREHENSIVE METABOLIC PANEL
Albumin: 4.4 (ref 3.5–5.0)
Calcium: 9.9 (ref 8.7–10.7)

## 2021-01-23 LAB — CBC AND DIFFERENTIAL
HCT: 38 (ref 36–46)
Hemoglobin: 12.6 (ref 12.0–16.0)
Platelets: 348 (ref 150–399)
WBC: 7.5

## 2021-01-23 LAB — POCT ERYTHROCYTE SEDIMENTATION RATE, NON-AUTOMATED: Sed Rate: 29

## 2021-01-23 MED ORDER — OXYCODONE-ACETAMINOPHEN 5-325 MG PO TABS: 1.0000 | ORAL_TABLET | Freq: Two times a day (BID) | ORAL | 0 refills | Status: DC | PRN

## 2021-01-23 NOTE — Telephone Encounter (Signed)
Pt called and said she hasnt taken in a few days and she is in a lot of pain and wanted to know if it could be sent in as soon as possible.

## 2021-01-27 IMAGING — MG DIGITAL DIAGNOSTIC BILAT W/ TOMO W/ CAD
8 of 12 series · 8 of 32 positions shown · non-contrast
Comparison: Previous exam(s).

CLINICAL DATA: 43-year-old who underwent malignant lumpectomy of
the UPPER OUTER QUADRANT of the LEFT breast in May 2016,
pathology invasive ductal carcinoma and DCIS which was ER positive,
PR negative and HER 2 Jim positive. Adjuvant radiation therapy and
chemotherapy. Patient currently undergoing hormonal chemoprevention
with tamoxifen.Annual evaluation.

Patient has undergone BILATERAL reduction mammoplasty.
EXAM:
DIGITAL DIAGNOSTIC BILATERAL MAMMOGRAM WITH CAD AND TOMO

[L MLO (1 of 2)]
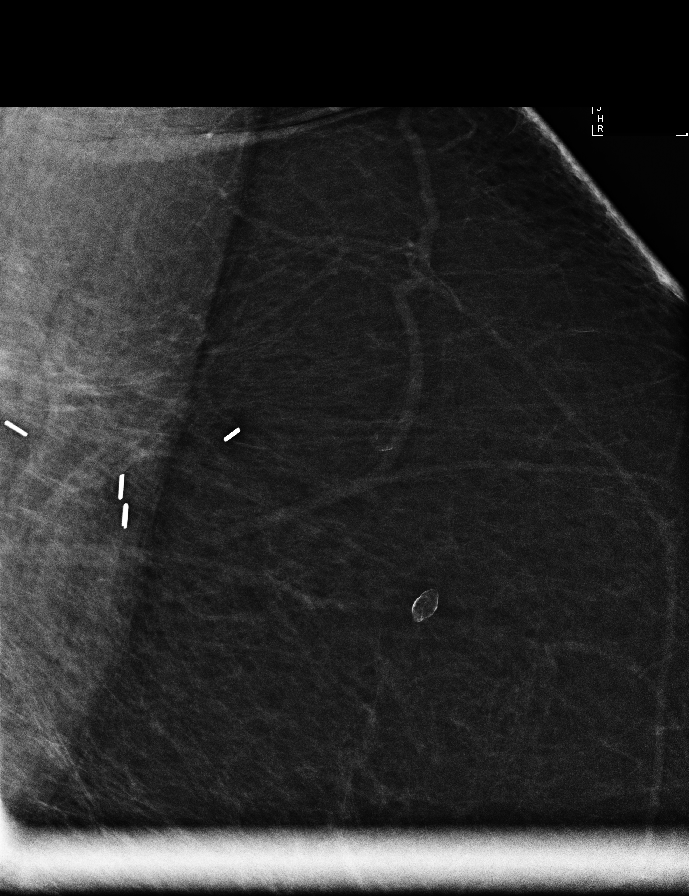

[L MLO (2 of 2)]
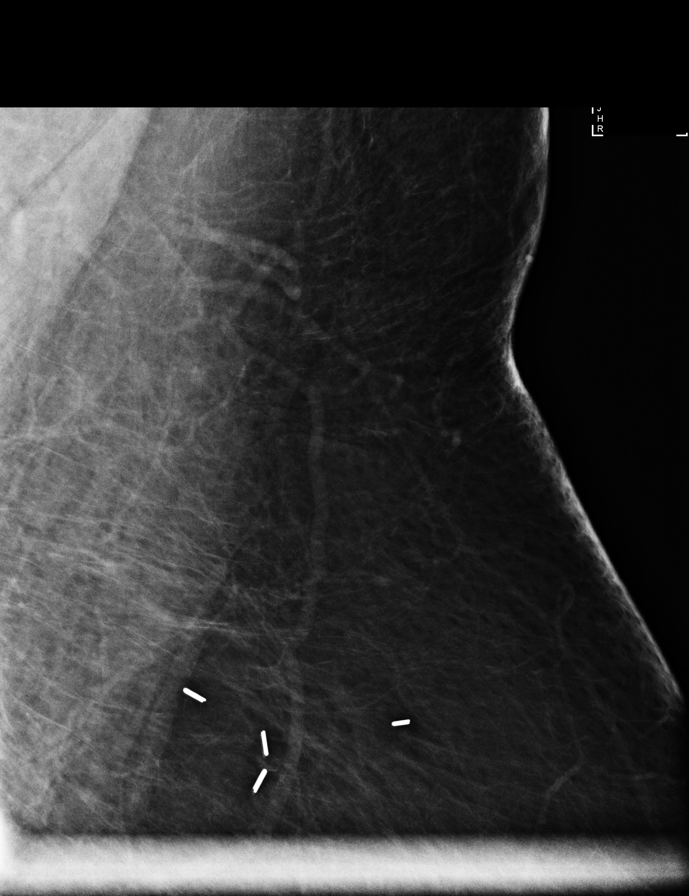

[R CC synth-2D]
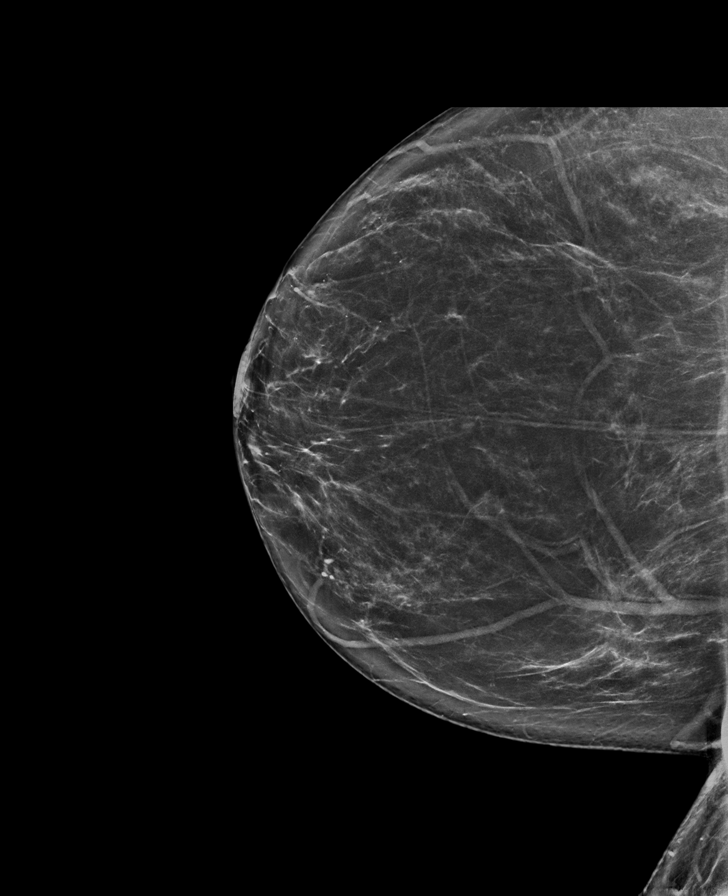

[R MLO synth-2D]
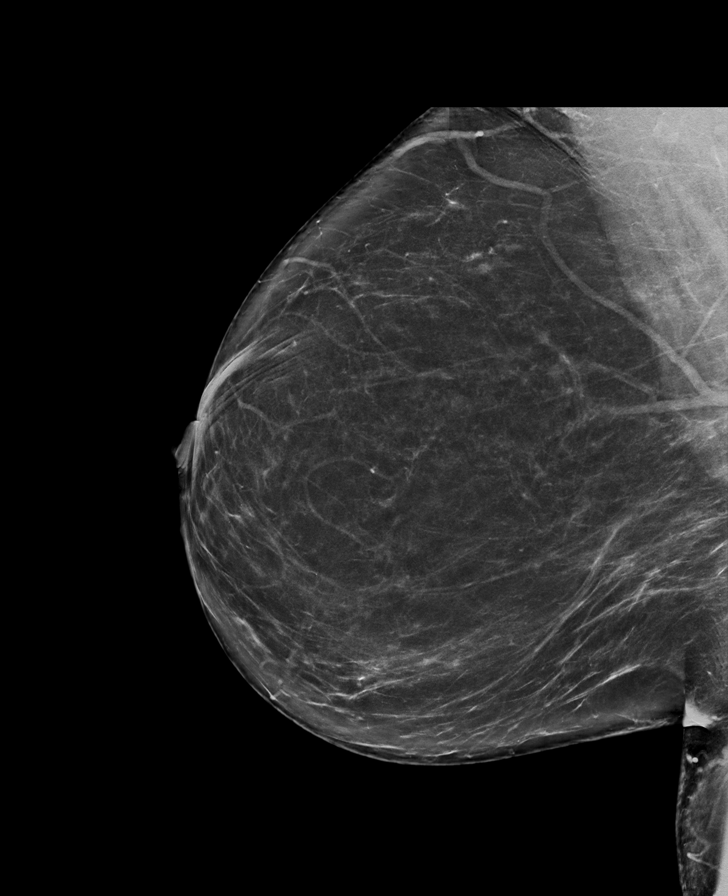

[L XCCL synth-2D]
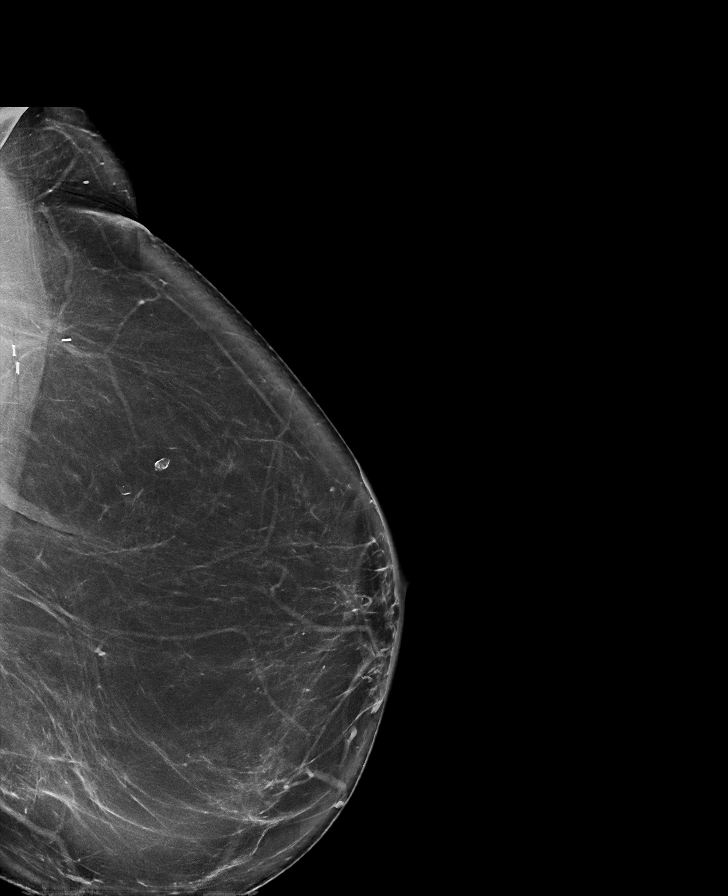

[L MLO synth-2D]
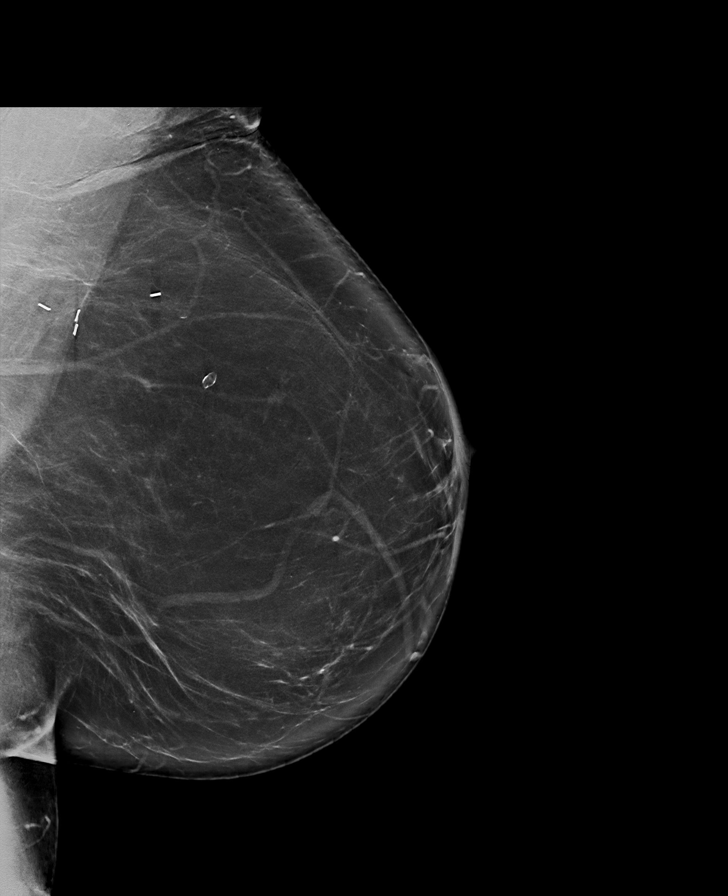

[L CC synth-2D]
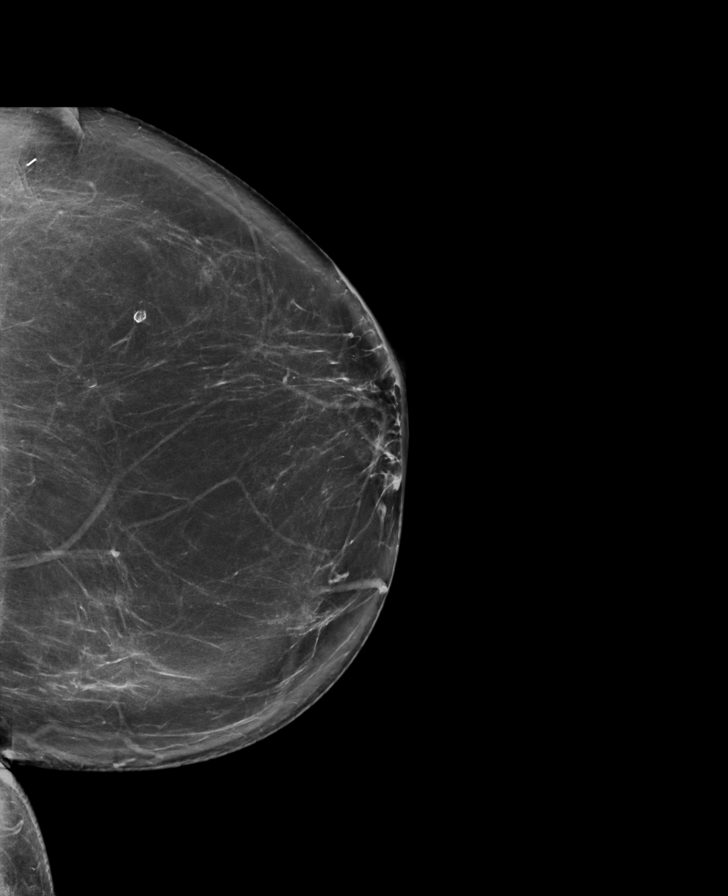

[L CC tomo · tomo slice 47/92.0]
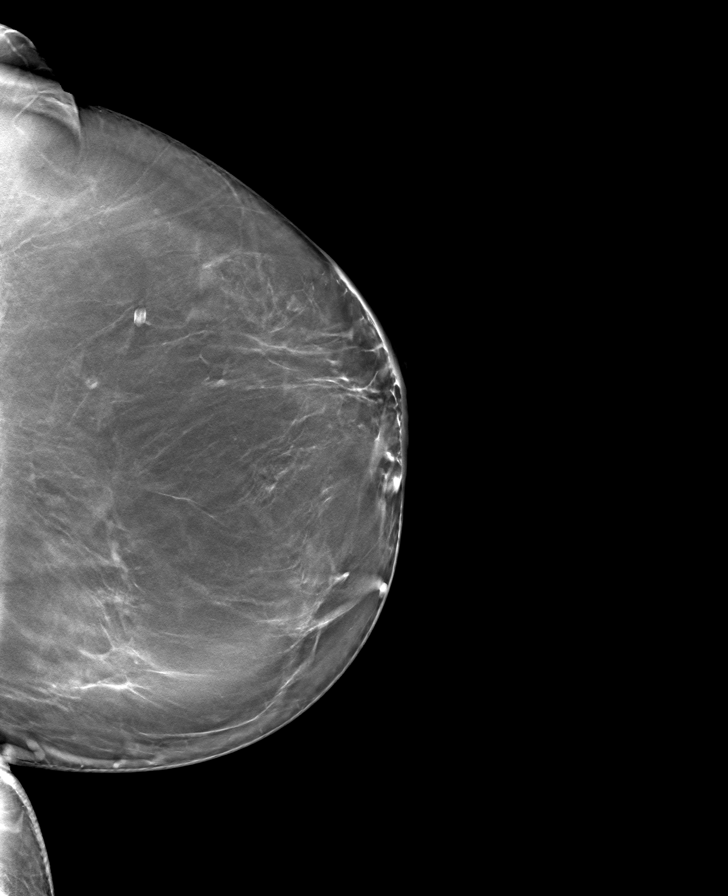

[8 of 32 positions shown; findings below may reference images not displayed]

ACR Breast Density Category b: There are scattered areas of
fibroglandular density.
FINDINGS: Tomosynthesis and synthesized full field CC and MLO views of both
breasts were obtained. Standard spot magnification MLO view of the
lumpectomy site in the LEFT breast was also obtained.

Post surgical scar/architectural distortion at the lumpectomy site
in the UPPER OUTER LEFT breast at POSTERIOR depth. No new or
suspicious findings in the LEFT breast.

Expected post surgical changes related to the prior reduction
mammoplasty. No new or suspicious findings in the RIGHT breast.

Mammographic images were processed with CAD.
IMPRESSION: No mammographic evidence of malignancy involving either breast.

RECOMMENDATION:
Annual BILATERAL diagnostic mammography in 1 year.

I have discussed the findings and recommendations with the patient.
If applicable, a reminder letter will be sent to the patient
regarding the next appointment.

BI-RADS CATEGORY  2: Benign.

## 2021-01-28 ENCOUNTER — Other Ambulatory Visit: Payer: Self-pay | Admitting: Family Medicine

## 2021-01-28 DIAGNOSIS — M940 Chondrocostal junction syndrome [Tietze]: Secondary | ICD-10-CM

## 2021-01-28 NOTE — Telephone Encounter (Signed)
Chart supports Rx 
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
## 2021-02-07 ENCOUNTER — Inpatient Hospital Stay: Admission: RE | Admit: 2021-02-07 | Payer: Medicare Other | Source: Ambulatory Visit

## 2021-02-07 ENCOUNTER — Ambulatory Visit (INDEPENDENT_AMBULATORY_CARE_PROVIDER_SITE_OTHER): Payer: Medicare Other | Admitting: Neurology

## 2021-02-07 ENCOUNTER — Encounter: Payer: Self-pay | Admitting: Neurology

## 2021-02-07 ENCOUNTER — Other Ambulatory Visit: Payer: Self-pay

## 2021-02-07 VITALS — BP 136/87 | HR 97 | Ht 64.0 in | Wt 223.0 lb

## 2021-02-07 DIAGNOSIS — G4440 Drug-induced headache, not elsewhere classified, not intractable: Secondary | ICD-10-CM

## 2021-02-07 DIAGNOSIS — G43009 Migraine without aura, not intractable, without status migrainosus: Secondary | ICD-10-CM | POA: Diagnosis not present

## 2021-02-07 MED ORDER — RIZATRIPTAN BENZOATE 10 MG PO TABS: 10.0000 mg | ORAL_TABLET | ORAL | 5 refills | Status: DC | PRN

## 2021-02-07 NOTE — Patient Instructions (Signed)
Ask Medicaid if they would prefer Aimovig or Ajovy now that you had a side effect to Emgality.  Let us know Stop imitrex.  At earliest onset of migraine, take rizatriptan '10mg'$ .  May repeat in 2 hours.  Maximum 2 tablets in 24 hours Stop Excedrin. Limit use of pain relievers to no more than 2 days out of week to prevent risk of rebound or medication-overuse headache. Keep headache diary Follow up 6 months.

## 2021-02-12 ENCOUNTER — Ambulatory Visit: Payer: Medicare Other | Admitting: Neurology

## 2021-02-12 ENCOUNTER — Ambulatory Visit: Payer: Medicare Other

## 2021-02-16 IMAGING — DX DG RIBS W/ CHEST 3+V BILAT
7 series · 7 of 7 positions shown · non-contrast
Comparison: 06/20/2019

CLINICAL DATA: Left thoracic back pain. Lower rib pain bilaterally.
History of breast cancer.

EXAM:
BILATERAL RIBS AND CHEST - 4+ VIEW

[w chest pa]
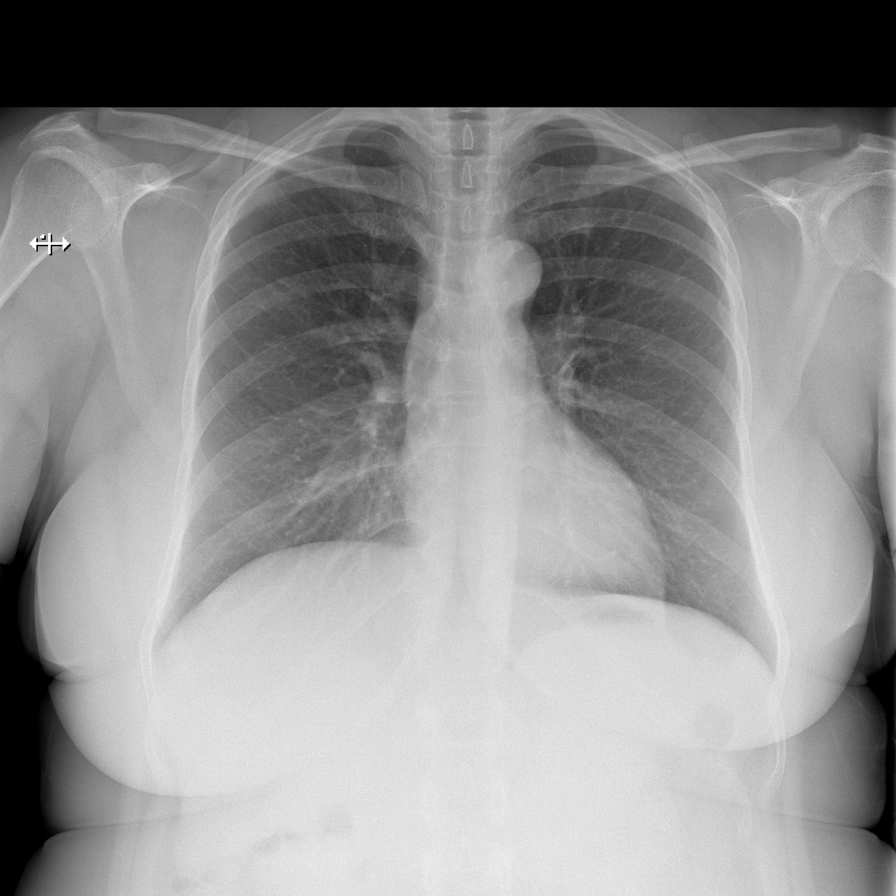

[w ribs pa upper left]
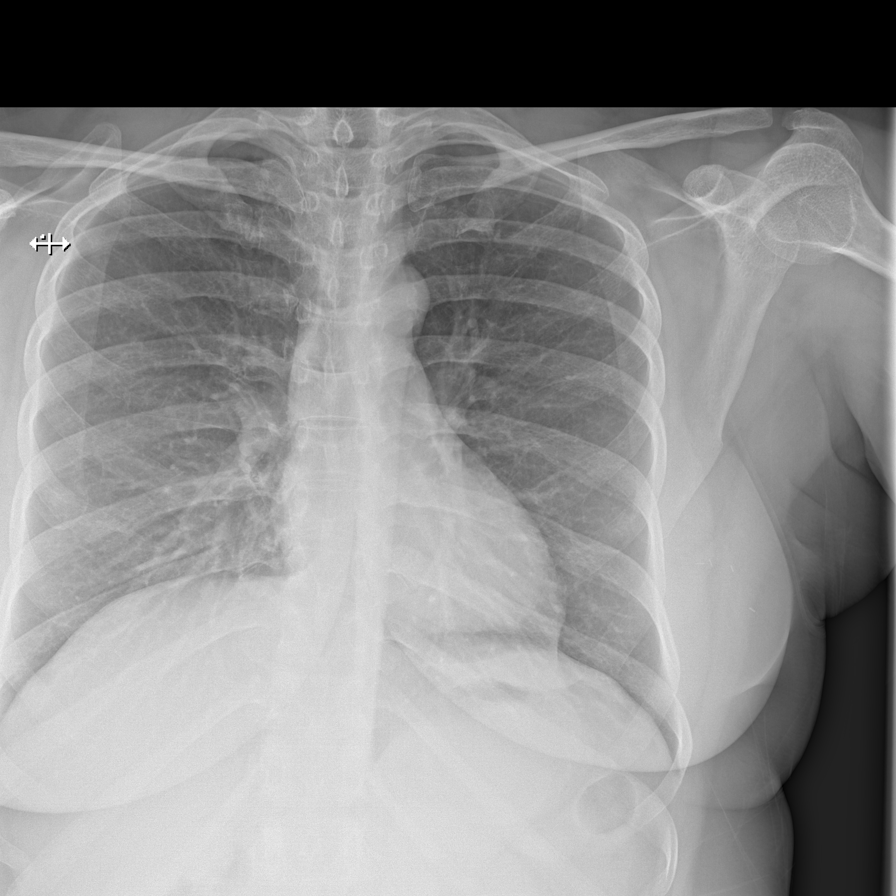

[w ribs pa lower left]
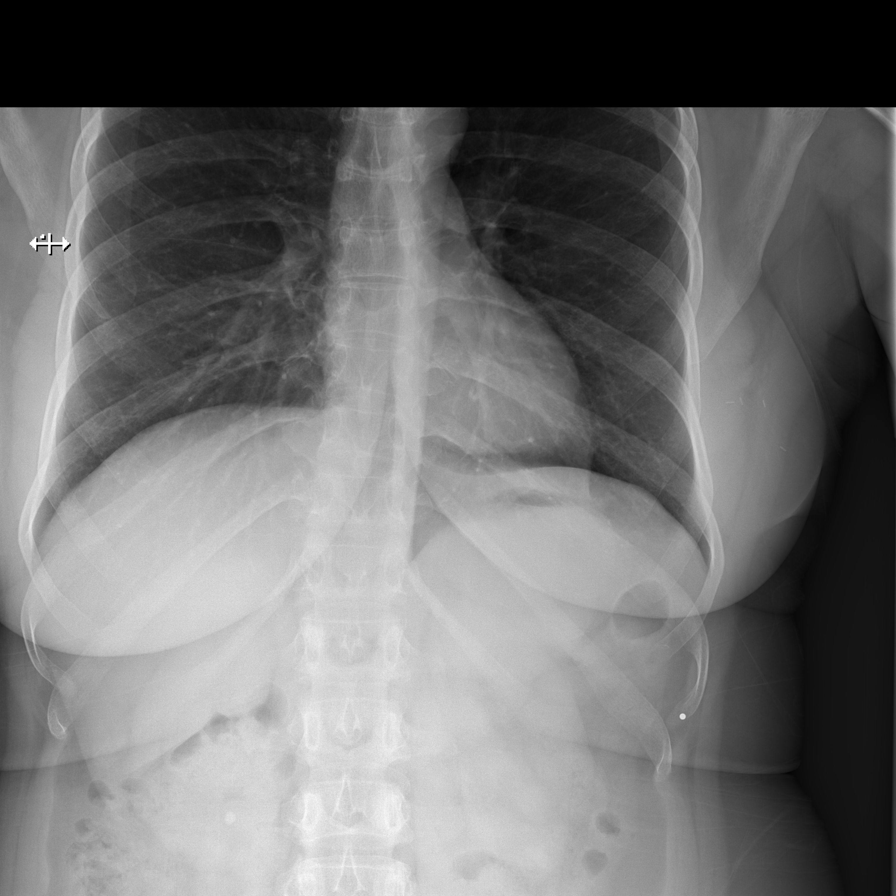

[w ribs obl left]
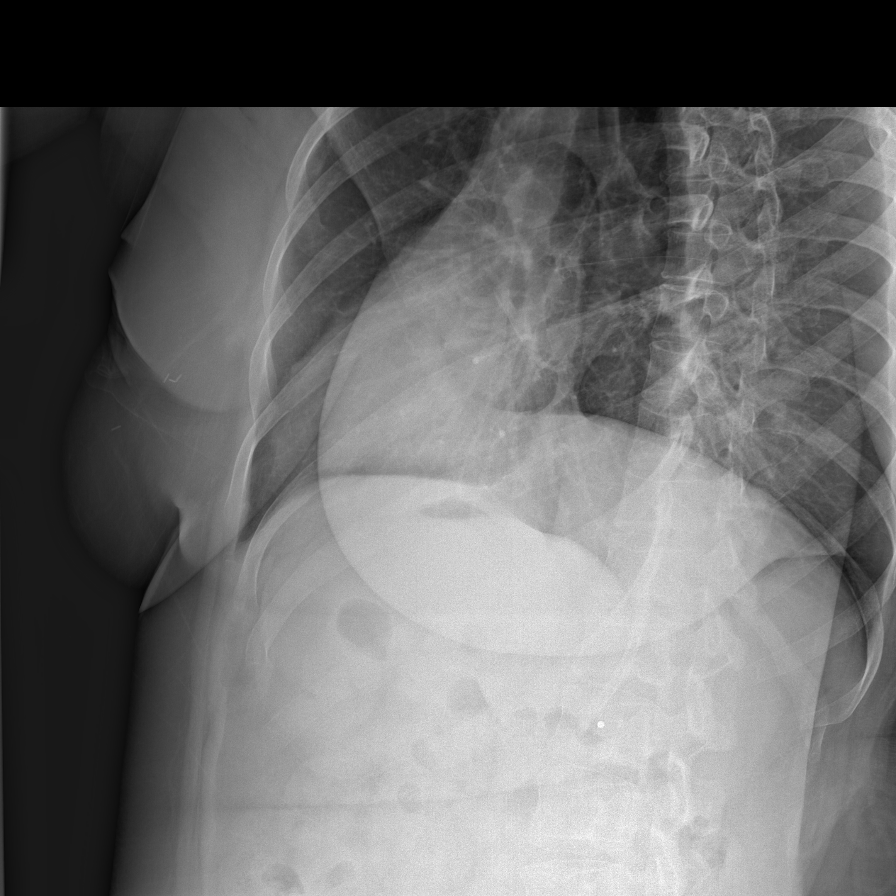

[w ribs pa upper right]
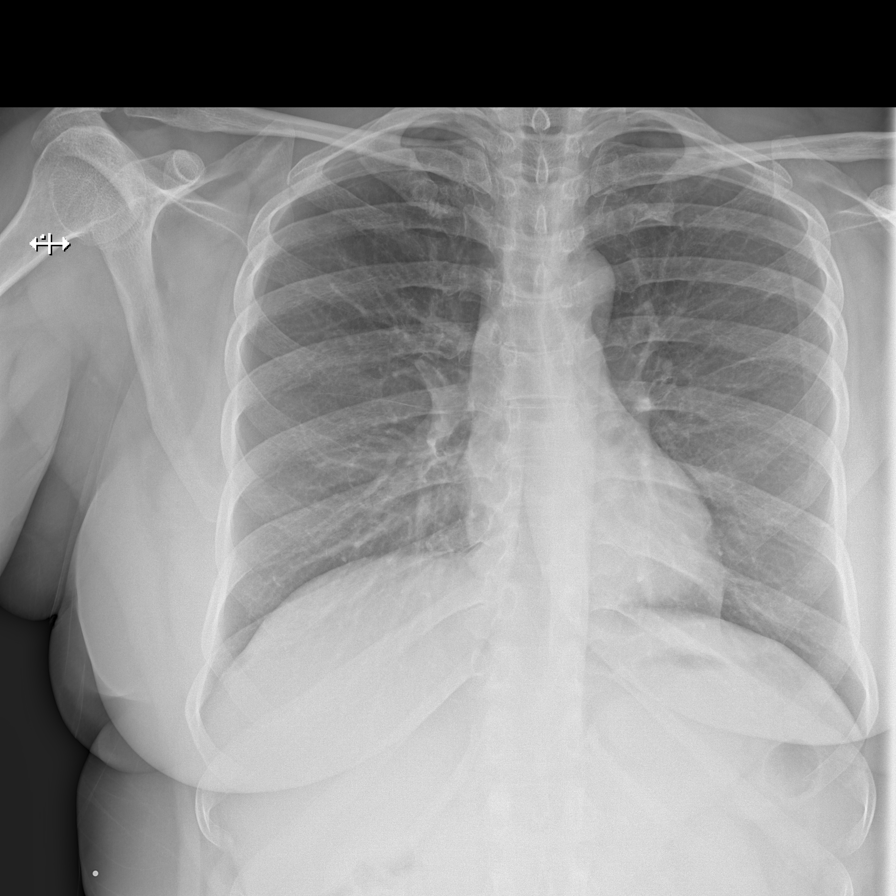

[w ribs pa lower right]
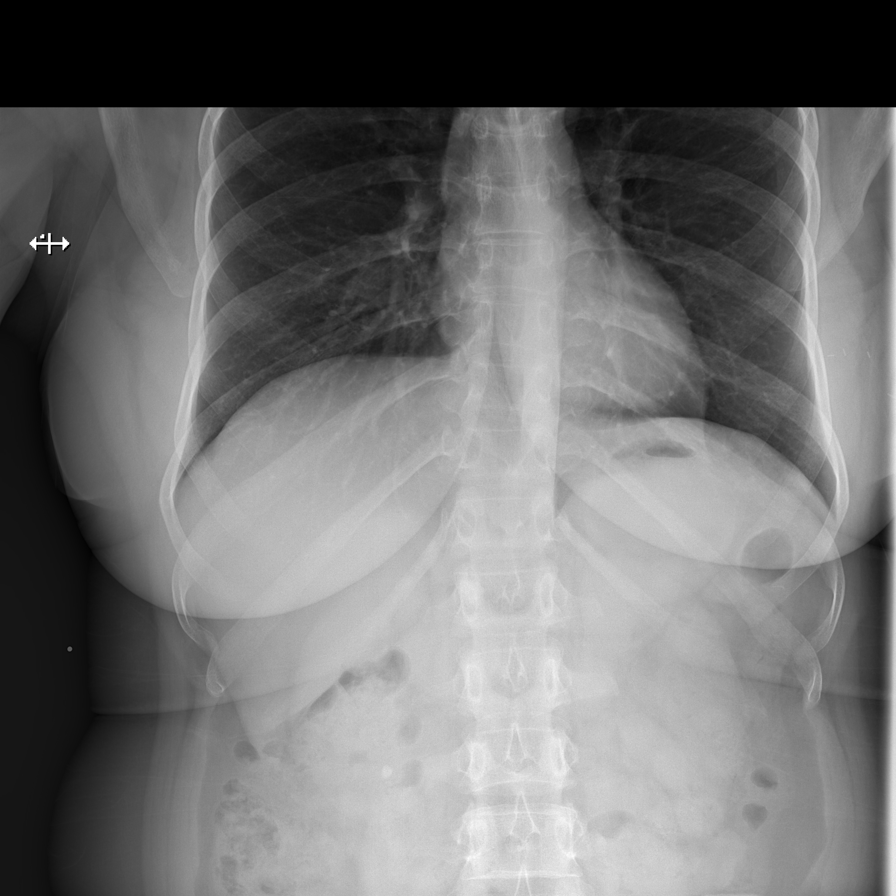

[w ribs obl right]
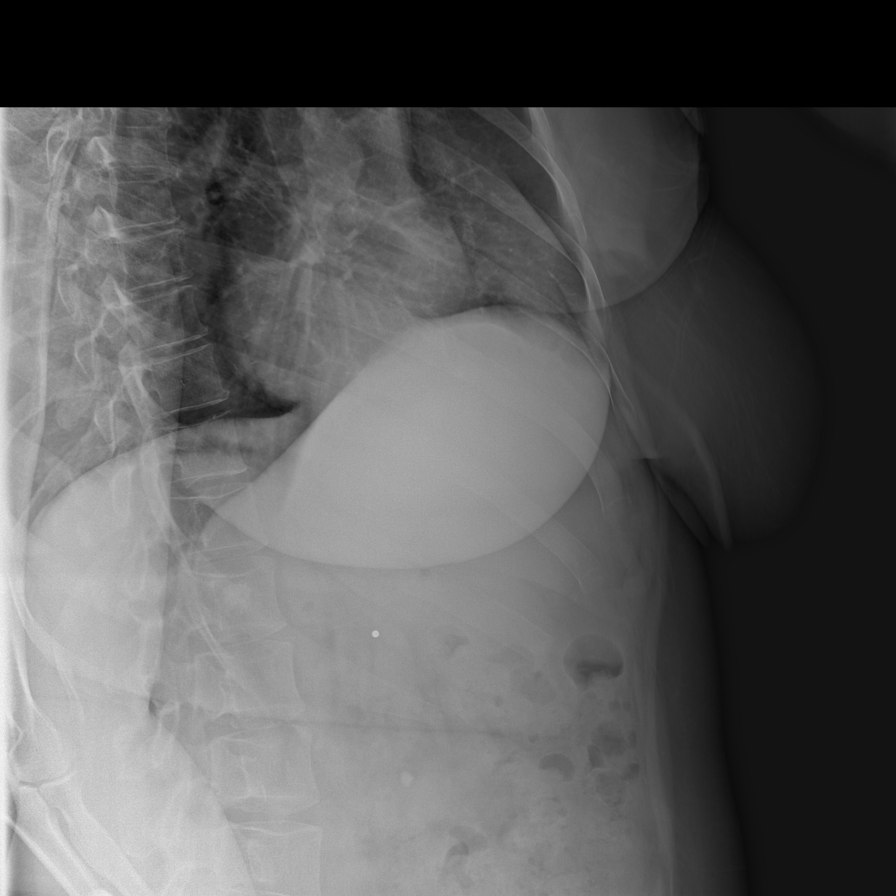

[7 of 7 positions shown; findings below may reference images not displayed]

FINDINGS: No fracture or other bone lesions are seen involving the ribs. There
is no evidence of pneumothorax or pleural effusion. Both lungs are
clear. Heart size and mediastinal contours are within normal limits.
IMPRESSION: Negative.
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
## 2021-02-20 ENCOUNTER — Ambulatory Visit: Payer: Medicaid Other | Admitting: Obstetrics and Gynecology

## 2021-02-27 ENCOUNTER — Other Ambulatory Visit: Payer: Self-pay

## 2021-02-27 DIAGNOSIS — M069 Rheumatoid arthritis, unspecified: Secondary | ICD-10-CM

## 2021-02-27 NOTE — Telephone Encounter (Signed)
Lft VM to rtn call.need to clarify location to send RX. Dm/cma

## 2021-02-28 ENCOUNTER — Other Ambulatory Visit: Payer: Self-pay

## 2021-02-28 DIAGNOSIS — M069 Rheumatoid arthritis, unspecified: Secondary | ICD-10-CM

## 2021-02-28 NOTE — Telephone Encounter (Signed)
Refill request for: Oxycodone/Acte  5/325 mg LR 01/23/21, #60, 0 rf LOV 11/09/20 (Dr Bryan Lemma) [place]  04/23/21 Baldo [place])   She wanted RX sent to [place] but will be in [place] tomorrow.  Please send to [place]    Dr Ethelene Hal wouldn't fill this RX are you comfortable filling it till her up coming appt?  Please review and advise.   Thanks.  Dm/cma

## 2021-02-28 NOTE — Telephone Encounter (Signed)
Refill request for: Oxycodone/Acte  5/325 mg LR 01/23/21, #60, 0 rf LOV 11/09/20 (Dr Bryan Lemma) [place]  04/23/21 Baldo Ash)  She wanted RX sent to [place] but will be in [place] tomorrow.  Please send to [place]   Please review and advise.   Thanks.  Dm/cma

## 2021-02-28 NOTE — Telephone Encounter (Signed)
Pt called back, please send Rx to  [place]  [phone]

## 2021-03-01 ENCOUNTER — Telehealth: Payer: Self-pay

## 2021-03-01 DIAGNOSIS — M069 Rheumatoid arthritis, unspecified: Secondary | ICD-10-CM

## 2021-03-01 MED ORDER — OXYCODONE-ACETAMINOPHEN 5-325 MG PO TABS: 1.0000 | ORAL_TABLET | Freq: Two times a day (BID) | ORAL | 0 refills | Status: DC | PRN

## 2021-03-01 NOTE — Telephone Encounter (Signed)
Patient notified VIA. Dm/cma

## 2021-03-01 NOTE — Addendum Note (Signed)
Addended by: Leana Gamer on: 03/01/2021 04:42 PM   Modules accepted: Orders

## 2021-03-01 NOTE — Addendum Note (Signed)
Addended by: Konrad Saha on: 03/01/2021 03:41 PM   Modules accepted: Orders

## 2021-03-01 NOTE — Telephone Encounter (Signed)
LFT VM to rtn call. Spoke to [employer], she will fill an RX only if she schedules an appt for acute to discuss pain meds and once she schedules this appointment,then she will send enough to get to that appointment. Dm/cma

## 2021-03-01 NOTE — Telephone Encounter (Signed)
Patient scheduled for a med check appt on 03/13/21 @ 10:30 am.  Can you please send refill on Oxycodone to the [facility] [address]? Please review and advise. Thanks. Dm/cma

## 2021-03-04 ENCOUNTER — Other Ambulatory Visit: Payer: Self-pay

## 2021-03-04 ENCOUNTER — Telehealth: Payer: Self-pay | Admitting: Family Medicine

## 2021-03-04 ENCOUNTER — Encounter: Payer: Self-pay | Admitting: Nurse Practitioner

## 2021-03-04 ENCOUNTER — Ambulatory Visit (INDEPENDENT_AMBULATORY_CARE_PROVIDER_SITE_OTHER): Payer: Medicare Other | Admitting: Nurse Practitioner

## 2021-03-04 VITALS — BP 114/76 | HR 88 | Temp 96.9°F | Wt 214.8 lb

## 2021-03-04 DIAGNOSIS — F332 Major depressive disorder, recurrent severe without psychotic features: Secondary | ICD-10-CM

## 2021-03-04 DIAGNOSIS — M069 Rheumatoid arthritis, unspecified: Secondary | ICD-10-CM | POA: Diagnosis not present

## 2021-03-04 DIAGNOSIS — G894 Chronic pain syndrome: Secondary | ICD-10-CM | POA: Insufficient documentation

## 2021-03-04 DIAGNOSIS — F411 Generalized anxiety disorder: Secondary | ICD-10-CM

## 2021-03-04 DIAGNOSIS — F410 Panic disorder [episodic paroxysmal anxiety] without agoraphobia: Secondary | ICD-10-CM

## 2021-03-04 NOTE — Patient Instructions (Addendum)
Will only be able to prescribe pain medication after the controlled substance agreement is signed and urine drug screen completed  Schedule appt with psychologist as soon as possible.  Sign medical release form to get records from Dr. Teodoro Spray office.  Suicidal Feelings: How to Help Yourself Suicide is when you end your own life. There are many things you can do to help yourself feel better when struggling with these feelings. Many services and people are available to support you and others who struggle with similarfeelings.  If you ever feel like you may hurt yourself or others, or have thoughts about taking your own life, get help right away. To get help: Call your local emergency services (911 in the [place] [facility] [place] [address]'s health and human services helpline (211 in the [place] Go to your nearest emergency department. Call a suicide hotline to speak with a trained counselor. The following suicide hotlines are available in the [place]: 1-800-273-TALK 276-311-0669). 1-800-SUICIDE 626-119-0567). 712-068-9330. This is a hotline for Spanish speakers. 587-822-2739. This is a hotline for TTY users. 1-866-4-U-TREVOR (530)088-7085). This is a hotline for lesbian, gay, bisexual, transgender, or questioning youth. For a list of hotlines in [place], visit ParkingAffiliatePrograms.se.html Contact a crisis center or a local suicide prevention center. To find a crisis center or suicide prevention center: Call your local hospital, clinic, community service organization, mental health center, social service provider, or health department. Ask for help with connecting to a crisis center. For a list of crisis centers in the [place], visit: suicidepreventionlifeline.org For a list of crisis centers in [place], visit: suicideprevention.ca How to help yourself feel better  Promise yourself that you will not do anything extreme when you have suicidal  feelings. Remember the times you have felt hopeful. Many people have gotten through suicidal thoughts and feelings, and you can too. If you have had these feelings before, remind yourself that you can get through them again. Let family, friends, teachers, or counselors know how you are feeling. Try not to separate yourself from those who care about you and want to help you. Talk with someone every day, even if you do not feel sociable. Face-to-face conversation is best to help them understand your feelings. Contact a mental health care provider and work with this person regularly. Make a safety plan that you can follow during a crisis. Include phone numbers of suicide prevention hotlines, mental health professionals, and trusted friends and family members you can call during an emergency. Save these numbers on your phone. If you are thinking of taking a lot of medicine, give your medicine to someone who can give it to you as prescribed. If you are on antidepressants and are concerned you will overdose, tell your health care provider so that he or she can give you safer medicines. Try to stick to your routines and follow a schedule every day. Make self-care a priority. Make a list of realistic goals, and cross them off when you achieve them. Accomplishments can give you a sense of worth. Wait until you are feeling better before doing things that you find difficult or unpleasant. Do things that you have always enjoyed to take your mind off your feelings. Try reading a book, or listening to or playing music. Spending time outside, in nature, may help you feel better. Follow these instructions at home:  Visit your primary health care provider every year for a checkup. Work with a mental health care provider as needed. Eat a well-balanced diet, and eat regular meals.  Get plenty of rest. Exercise if you are able. Just 30 minutes of exercise each day can help you feel better. Take over-the-counter and  prescription medicines only as told by your health care provider. Ask your mental health care provider about the possible side effects of any medicines you are taking. Do not use alcohol or drugs, and remove these substances from your home. Remove weapons, poisons, knives, and other deadly items from your home. General recommendations Keep your living space well lit. When you are feeling well, write yourself a letter with tips and support that you can read when you are not feeling well. Remember that life's difficulties can be sorted out with help. Conditions can be treated, and you can learn behaviors and ways of thinking that will help you. Where to find more information National Suicide Prevention Lifeline: www.suicidepreventionlifeline.org Hopeline: www.hopeline.Emp[place] for Suicide Prevention: PromotionalLoans.co.za [employer] (for lesbian, gay, bisexual, transgender, or questioning youth): www.thetrevorproject.[employer] of Mental Health: http://bridges.com/ Contact a health care provider if: You feel as though you are a burden to others. You feel agitated, angry, vengeful, or have extreme mood swings. You have withdrawn from family and friends. Get help right away if: You are talking about suicide or wishing to die. You start making plans for how to commit suicide. You feel that you have no reason to live. You start making plans for putting your affairs in order, saying goodbye, or giving your possessions away. You feel guilt, shame, or unbearable pain, and it seems like there is no way out. You are frequently using drugs or alcohol. You are engaging in risky behaviors that could lead to death. If you have any of these symptoms, get help right away. Call emergency services, go to your nearest emergency department or crisis center, or call a suicide crisis helpline. Summary Suicide is when you take your own life. Promise  yourself that you will not do anything extreme when you have suicidal feelings. Let family, friends, teachers, or counselors know how you are feeling. Get help right away if you start making plans for how to commit suicide. This information is not intended to replace advice given to you by your health care provider. Make sure you discuss any questions you have with your healthcare provider. Document Revised: 03/07/2020 Document Reviewed: 03/09/2020 Elsevier Patient Education  2022 [employer].

## 2021-03-04 NOTE — Progress Notes (Signed)
Subjective:  Patient ID: [name], female    DOB: February 08, 1976  Age: 45 y.o. MRN: [id number]  CC: Acute Visit (Pt states she has rheumatoid arthritis and she has been seeing a specialist for 3 years and does not feel that he is helping and would like a referral for another doctor. )  HPI Transfer from Dr. Bryan Lemma.  Chronic pain syndrome [name] is here to discuss chronic joint pain secondary to rheumatoid Arthritis. She is currently under the care of Dr. Kathlene November with [facility] but wishes to switch to [facility] due to lack of improvement with current medications(methotrexate and prednisone). She has use relafen, tramadol and motrin in past with no improvement. She has been on oxycodone/acetaminophen 5/'325mg'$  x 21yr for pain management. This provided moderate pain relief. She takes 1tab BID. She denies an falls or constipation of daytime somnolence. She also has a hx of OSA: unable to tolerate CPAP machine, current use of ambien prn for sleep. PMP database review:Oxycodone (06/28/20 #60, 08/06/20 #60, 01/23/21 #60, 03/01/21 #20) Ambien (06/28/20 #15). [place]  I explained the need for controlled substance contract, OV every 316monthand UDS at least once a year. Advised to discuss additional ambien refills with neurology since she is not using a CPAP. Advised about the risk of over sedation when you combine a hypnotic, narcotic and underlying hx of OSA without use of CPAP. Also advised about risk of dependence on narcotics. She declined to sign contract and provided UDS today. She wants to review contract at home first. Advised I will not be about to continue filling any narcotic rx till contract and UDS is completed. She verbalized understanding.  Generalized anxiety disorder with panic attacks She also has hx of anxiety and Depression: waxing and waning mood.  Symptoms are active with intermittent thoughts of death. She denies any  intention/plan to hurt herself or anyone due to her christian beliefs. Current use of effexor daily.  She is unable to continue counseling sessions because her therapist has switched to virtual appts. She plans to find another therapist. Hx of IVC 2058yrgo.  [facility]21 06/28/2020 02/17/2020  Decreased Interest 3 1 0  Down, Depressed, Hopeless 3 1 0  PHQ - 2 Score 6 2 0  Altered sleeping 3 1 -  Tired, decreased energy 3 3 -  Change in appetite 3 3 -  Feeling bad or failure about yourself  3 1 -  Trouble concentrating 3 1 -  Moving slowly or fidgety/restless 1 1 -  Suicidal thoughts 2 0 -  PHQ-9 Score 24 12 -  Difficult doing work/chores Extremely dIfficult Somewhat difficult -  Some recent data might be hidden   GAD 7 : Generalized Anxiety Score 03/04/2021 05/13/2019  Nervous, Anxious, on Edge 1 3  Control/stop worrying 3 3  Worry too much - different things 3 3  Trouble relaxing 3 2  Restless 1 2  Easily annoyed or irritable 2 3  Afraid - awful might happen 3 2  Total GAD 7 Score 16 18  Anxiety Difficulty Extremely difficult Extremely difficult   She is able to provide verbal safety contract F/up in 2weeks to discuss medication management.  Reviewed past Medical, Social and Family history today.  Outpatient Medications Prior to Visit  Medication Sig Dispense Refill   dicyclomine (BENTYL) 10 MG capsule TAKE 1 CAPSULE(10 MG) BY MOUTH FOUR TIMES DAILY BEFORE MEALS AND AT BEDTIME 120 capsule 3  folic acid (FOLVITE) 1 MG tablet 2 tablets     LORazepam (ATIVAN) 0.5 MG tablet 1 tablet at bedtime as needed     methotrexate 2.5 MG tablet Take 6 tablets by mouth once a week.     nabumetone (RELAFEN) 750 MG tablet TAKE 1 TABLET(750 MG) BY MOUTH TWICE DAILY 60 tablet 0   omeprazole (PRILOSEC) 40 MG capsule Take 1 capsule (40 mg total) by mouth 2 (two) times daily. Take in the morning and at dinnertime. 60 capsule 11   ondansetron (ZOFRAN ODT) 4 MG disintegrating  tablet Take 1 tablet (4 mg total) by mouth every 8 (eight) hours as needed for nausea or vomiting. 4 tablet 0   oxyCODONE-acetaminophen (PERCOCET) 5-325 MG tablet Take 1 tablet by mouth 2 (two) times daily as needed for severe pain. 20 tablet 0   propranolol (INDERAL) 60 MG tablet 1 tablet on an empty stomach     rizatriptan (MAXALT) 10 MG tablet Take 1 tablet (10 mg total) by mouth as needed for migraine (For migraines.  May repeat in 2 hours.  Maximum 2 tablets in 24 hours.). May repeat in 2 hours if needed 10 tablet 5   sulfaSALAzine (AZULFIDINE) 500 MG tablet Take 500 mg by mouth at bedtime.     venlafaxine XR (EFFEXOR-XR) 150 MG 24 hr capsule TAKE 1 CAPSULE(150 MG) BY MOUTH DAILY WITH BREAKFAST 90 capsule 3   Vitamin D, Ergocalciferol, (DRISDOL) 1.25 MG (50000 UNIT) CAPS capsule Take 1 capsule (50,000 Units total) by mouth every 7 (seven) days. 5 capsule 2   zolpidem (AMBIEN) 10 MG tablet 1 tablet at bedtime as needed     furosemide (LASIX) 20 MG tablet TAKE 1 TABLET(20 MG) BY MOUTH DAILY 30 tablet 3   ipratropium (ATROVENT) 0.06 % nasal spray PLACE 2 SPRAYS INTO BOTH NOSTRILS 4 (FOUR) TIMES DAILY. (Patient taking differently: Place 2 sprays into both nostrils as needed.) 15 mL 0   spironolactone (ALDACTONE) 25 MG tablet 1 tablet     zolpidem (AMBIEN) 10 MG tablet Take 1 tablet (10 mg total) by mouth at bedtime as needed for sleep. 30 tablet 0   Facility-Administered Medications Prior to Visit  Medication Dose Route Frequency Provider Last Rate Last Admin   albuterol (VENTOLIN HFA) 108 (90 Base) MCG/ACT inhaler 2 puff  2 puff Inhalation Once PRN Causey, Charlestine Massed, NP       diphenhydrAMINE (BENADRYL) injection 50 mg  50 mg Intramuscular Once PRN Causey, Charlestine Massed, NP       EPINEPHrine (EPI-PEN) injection 0.3 mg  0.3 mg Intramuscular Once PRN Causey, Charlestine Massed, NP       ipratropium-albuterol (DUONEB) 0.5-2.5 (3) MG/3ML nebulizer solution 3 mL  3 mL Nebulization Q6H Briscoe Deutscher, DO   3 mL at 08/10/18 1401   methylPREDNISolone sodium succinate (SOLU-MEDROL) 125 mg/2 mL injection 125 mg  125 mg Intramuscular Once PRN Causey, Charlestine Massed, NP        ROS See HPI  Objective:  BP 114/76 (BP Location: Right Arm, Patient Position: Sitting, Cuff Size: Large)   Pulse 88   Temp (!) 96.9 F (36.1 C) (Temporal)   Wt 214 lb 12.8 oz (97.4 kg)   LMP  (LMP Unknown)   SpO2 96%   BMI 36.87 kg/m   Physical Exam Cardiovascular:     Rate and Rhythm: Normal rate.     Pulses: Normal pulses.  Pulmonary:     Effort: Pulmonary effort is normal.  Neurological:  Mental Status: She is alert and oriented to person, place, and time.  Psychiatric:        Mood and Affect: Mood normal.        Behavior: Behavior normal.        Thought Content: Thought content normal.   Assessment & Plan:  This visit occurred during the SARS-CoV-2 public health emergency.  Safety protocols were in place, including screening questions prior to the visit, additional usage of staff PPE, and extensive cleaning of exam room while observing appropriate contact time as indicated for disinfecting solutions.   Analiyah was seen today for acute visit.  Diagnoses and all orders for this visit:  Rheumatoid arthritis, involving unspecified site, unspecified whether rheumatoid factor present ([place]) -     Ambulatory referral to Rheumatology  Severe episode of recurrent major depressive disorder, without psychotic features ([place])  Generalized anxiety disorder with panic attacks  Chronic pain syndrome   Problem List Items Addressed This Visit       Musculoskeletal and Integument   Rheumatoid arthritis ([place]) - Primary   Relevant Orders   Ambulatory referral to Rheumatology     Other   Major depressive disorder (Chronic)   Chronic pain syndrome    Ms. Patin is here to discuss chronic joint pain secondary to rheumatoid Arthritis. She is currently under the care of Dr. Kathlene November with [facility] but wishes  to switch to [facility] due to lack of improvement with current medications(methotrexate and prednisone). She has use relafen, tramadol and motrin in past with no improvement. She has been on oxycodone/acetaminophen 5/'325mg'$  x 21yr for pain management. This provided moderate pain relief. She takes 1tab BID. She denies an falls or constipation of daytime somnolence. She also has a hx of OSA: unable to tolerate CPAP machine, current use of ambien prn for sleep. PMP database review:Oxycodone (06/28/20 #60, 08/06/20 #60, 01/23/21 #60, 03/01/21 #20) Ambien (06/28/20 #15). [place]  I explained the need for controlled substance contract, OV every 392monthand UDS at least once a year. Advised to discuss additional ambien refills with neurology since she is not using a CPAP. Advised about the risk of over sedation when you combine a hypnotic, narcotic and underlying hx of OSA without use of CPAP. Also advised about risk of dependence on narcotics. She declined to sign contract and provided UDS today. She wants to review contract at home first. Advised I will not be about to continue filling any narcotic rx till contract and UDS is completed. She verbalized understanding.      Generalized anxiety disorder with panic attacks    She also has hx of anxiety and Depression: waxing and waning mood.  Symptoms are active with intermittent thoughts of death. She denies any intention/plan to hurt herself or anyone due to her christian beliefs. Current use of effexor daily.  She is unable to continue counseling sessions because her therapist has switched to virtual appts. She plans to find another therapist. Hx of IVC 2022yrgo.  [facility] 03/04/2021 06/28/2020 02/17/2020  Decreased Interest 3 1 0  Down, Depressed, Hopeless 3 1 0  PHQ - 2 Score 6 2 0  Altered sleeping 3 1 -  Tired, decreased energy 3 3 -  Change in appetite 3 3 -  Feeling bad or  failure about yourself  3 1 -  Trouble concentrating 3 1 -  Moving slowly or fidgety/restless 1 1 -  Suicidal thoughts 2 0 -  PHQ-9 Score 24 12 -  Difficult doing work/chores Extremely dIfficult Somewhat difficult -  Some recent data might be hidden   GAD 7 : Generalized Anxiety Score 03/04/2021 05/13/2019  Nervous, Anxious, on Edge 1 3  Control/stop worrying 3 3  Worry too much - different things 3 3  Trouble relaxing 3 2  Restless 1 2  Easily annoyed or irritable 2 3  Afraid - awful might happen 3 2  Total GAD 7 Score 16 18  Anxiety Difficulty Extremely difficult Extremely difficult  She is able to provide verbal safety contract F/up in 2weeks to discuss medication management.       Follow-up: Return in about 2 weeks (around 03/18/2021) for depressiona and anxiety (36mns).  CWilfred Lacy NP

## 2021-03-04 NOTE — Telephone Encounter (Signed)
error 

## 2021-03-05 ENCOUNTER — Encounter: Payer: Self-pay | Admitting: Nurse Practitioner

## 2021-03-05 NOTE — Assessment & Plan Note (Addendum)
She also has hx of anxiety and Depression: waxing and waning mood.  Symptoms are active with intermittent thoughts of death. She denies any intention/plan to hurt herself or anyone due to her christian beliefs. Current use of effexor daily.  She is unable to continue counseling sessions because her therapist has switched to virtual appts. She plans to find another therapist. Hx of IVC 43yr ago.  [facility] 03/04/2021 06/28/2020 02/17/2020  Decreased Interest 3 1 0  Down, Depressed, Hopeless 3 1 0  PHQ - 2 Score 6 2 0  Altered sleeping 3 1 -  Tired, decreased energy 3 3 -  Change in appetite 3 3 -  Feeling bad or failure about yourself  3 1 -  Trouble concentrating 3 1 -  Moving slowly or fidgety/restless 1 1 -  Suicidal thoughts 2 0 -  PHQ-9 Score 24 12 -  Difficult doing work/chores Extremely dIfficult Somewhat difficult -  Some recent data might be hidden   GAD 7 : Generalized Anxiety Score 03/04/2021 05/13/2019  Nervous, Anxious, on Edge 1 3  Control/stop worrying 3 3  Worry too much - different things 3 3  Trouble relaxing 3 2  Restless 1 2  Easily annoyed or irritable 2 3  Afraid - awful might happen 3 2  Total GAD 7 Score 16 18  Anxiety Difficulty Extremely difficult Extremely difficult   She is able to provide verbal safety contract F/up in 2weeks to discuss medication management.

## 2021-03-05 NOTE — Assessment & Plan Note (Addendum)
[name] is here to discuss chronic joint pain secondary to rheumatoid Arthritis. She is currently under the care of Dr. Kathlene November with [facility] but wishes to switch to [facility] due to lack of improvement with current medications(methotrexate and prednisone). She has use relafen, tramadol and motrin in past with no improvement. She has been on oxycodone/acetaminophen 5/'325mg'$  x 42yr for pain management. This provided moderate pain relief. She takes 1tab BID. She denies an falls or constipation of daytime somnolence. She also has a hx of OSA: unable to tolerate CPAP machine, current use of ambien prn for sleep. PMP database review:Oxycodone (06/28/20 #60, 08/06/20 #60, 01/23/21 #60, 03/01/21 #20) Ambien (06/28/20 #15). V[place]  I explained the need for controlled substance contract, OV every 376monthand UDS at least once a year. Advised to discuss additional ambien refills with neurology since she is not using a CPAP. Advised about the risk of over sedation when you combine a hypnotic, narcotic and underlying hx of OSA without use of CPAP. Also advised about risk of dependence on narcotics. She declined to sign contract and provided UDS today. She wants to review contract at home first. Advised I will not be about to continue filling any narcotic rx till contract and UDS is completed. She verbalized understanding.

## 2021-03-13 ENCOUNTER — Ambulatory Visit: Payer: Medicare Other | Admitting: Nurse Practitioner

## 2021-03-24 IMAGING — CT CT CHEST HIGH RESOLUTION W/O CM
2 of 6 series · 15 of 36 positions shown, 18 images · non-contrast
Comparison: 12/05/2015 chest CT angiogram.

CLINICAL DATA: Posterior left rib pain for 2 months without
reported injury. History of left breast cancer in 2456 status post
radiation therapy, lumpectomy and chemotherapy.

EXAM:
CT CHEST WITHOUT CONTRAST
TECHNIQUE: Multidetector CT imaging of the chest was performed following the
standard protocol without intravenous contrast. High resolution
imaging of the lungs, as well as inspiratory and expiratory imaging,
was performed.

[Series 2: thorax · axial · 0.76mm/px · z∈[-252,-10]mm · 12 of 144 slices shown, 15 images]
[im 12/144  mediastinal]
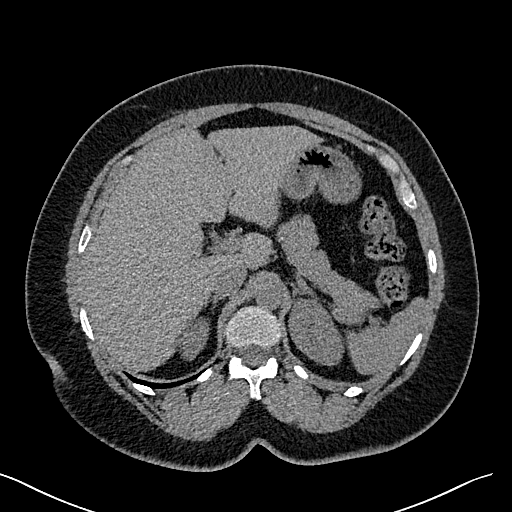
[im 12/144  lung]
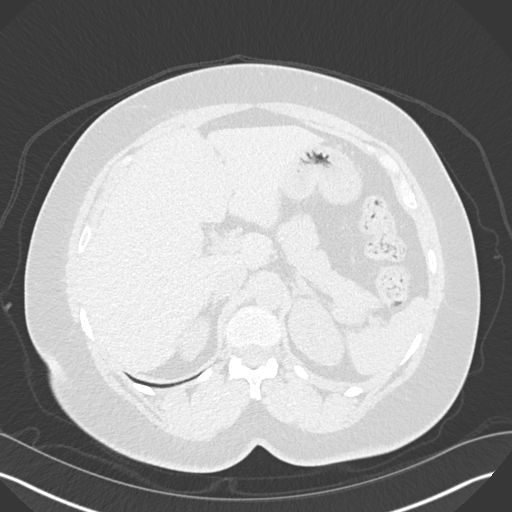
[im 23/144  lung]
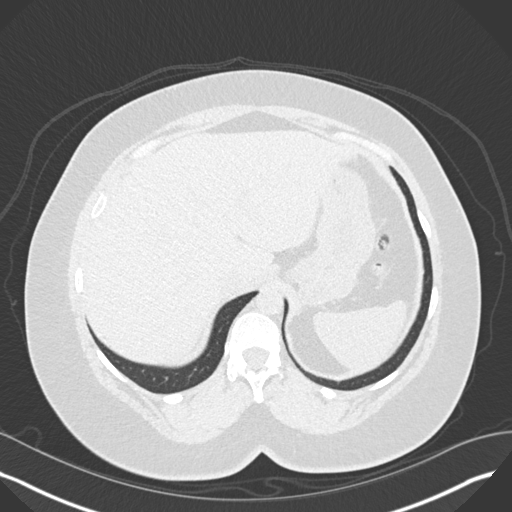
[im 34/144  lung]
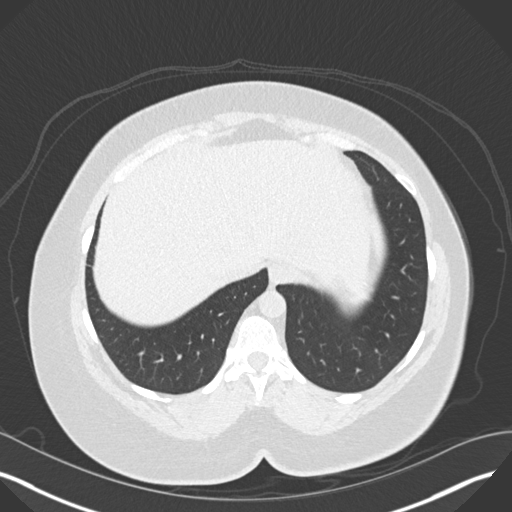
[im 45/144  lung]
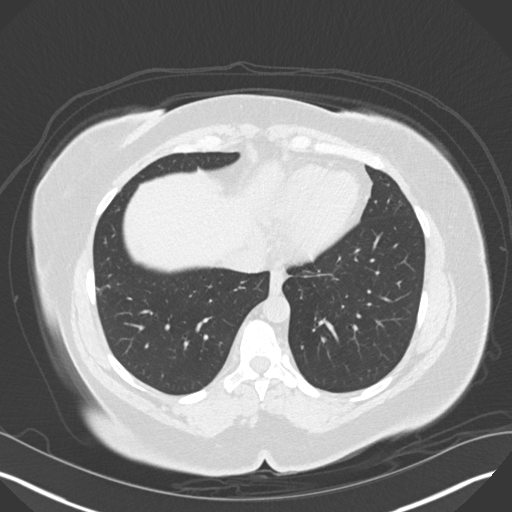
[im 56/144  mediastinal]
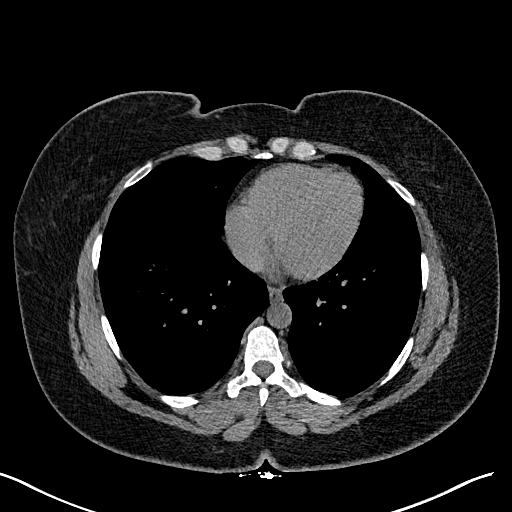
[im 56/144  lung]
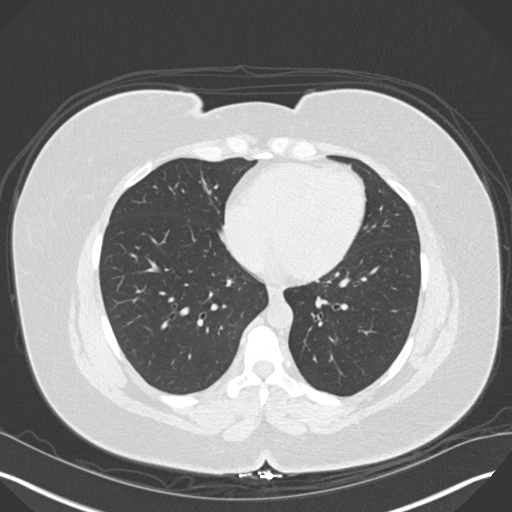
[im 67/144  lung]
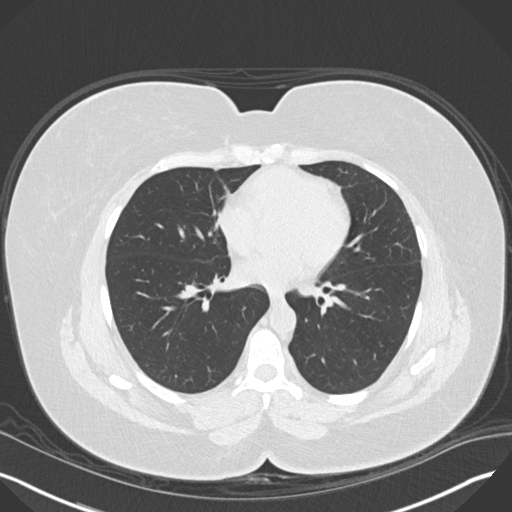
[im 78/144  lung]
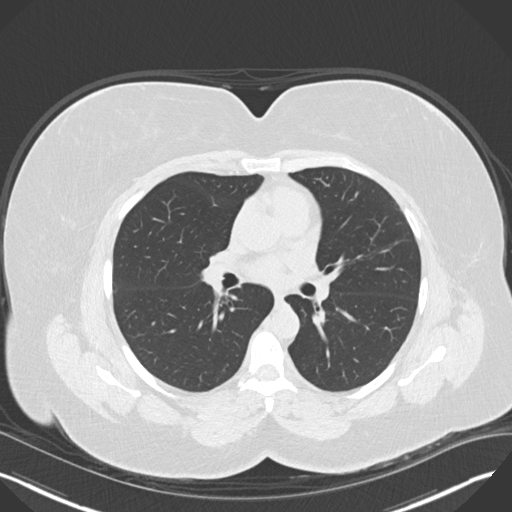
[im 89/144  lung]
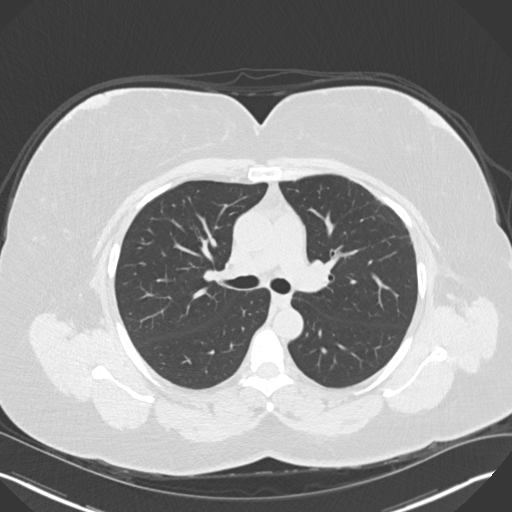
[im 100/144  mediastinal]
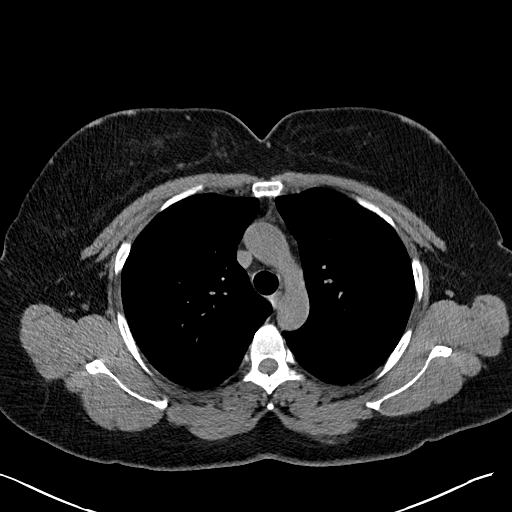
[im 100/144  lung]
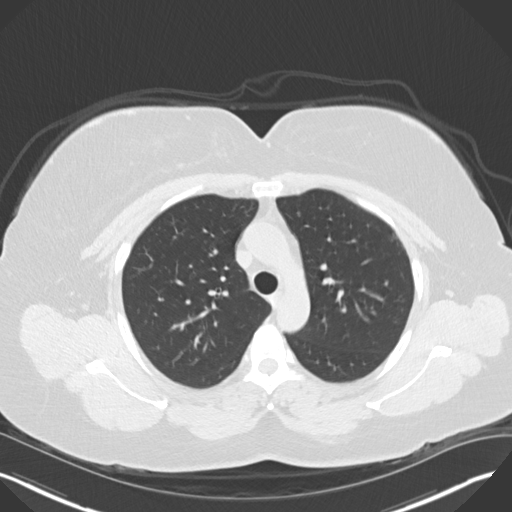
[im 111/144  lung]
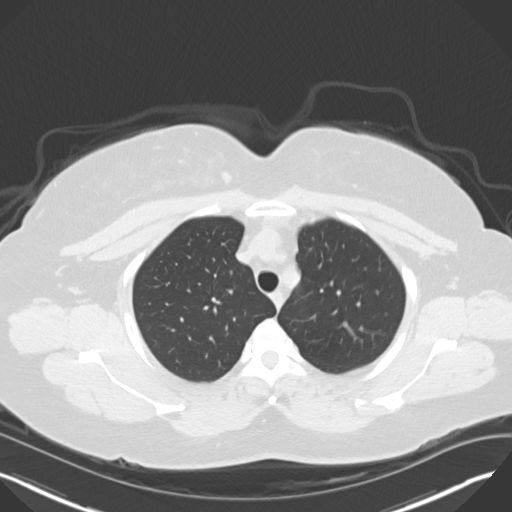
[im 122/144  lung]
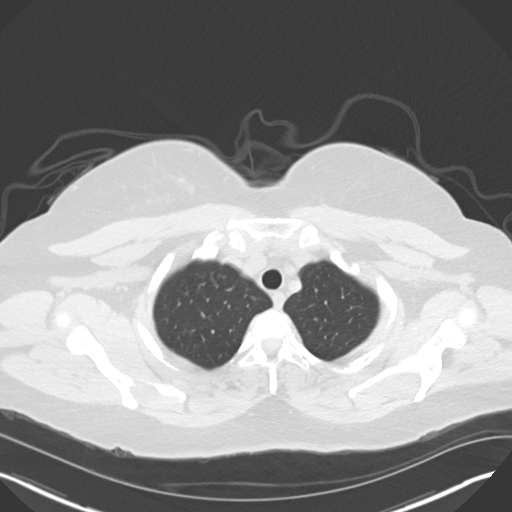
[im 133/144  lung]
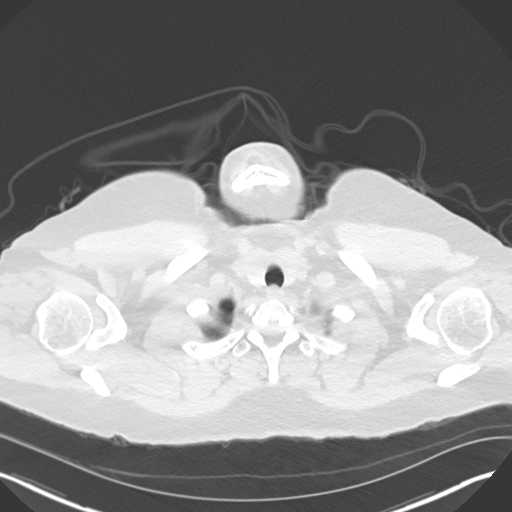

[Series 6: coronal · coronal · 0.59mm/px · 3 of 103 slices shown]
[im 21/103  lung]
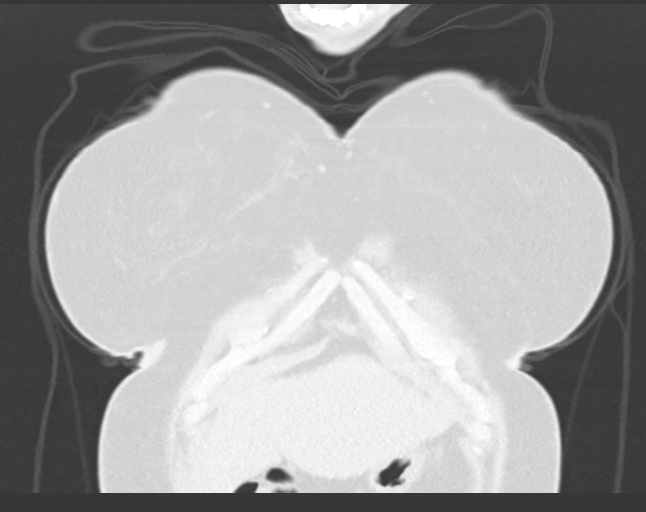
[im 41/103  lung]
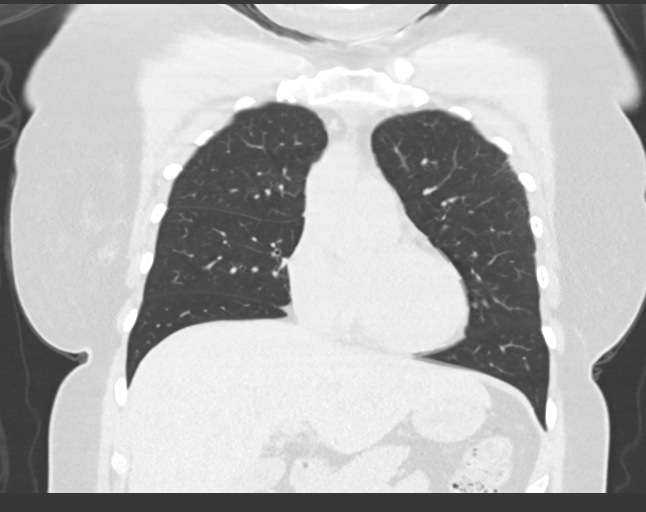
[im 62/103  lung]
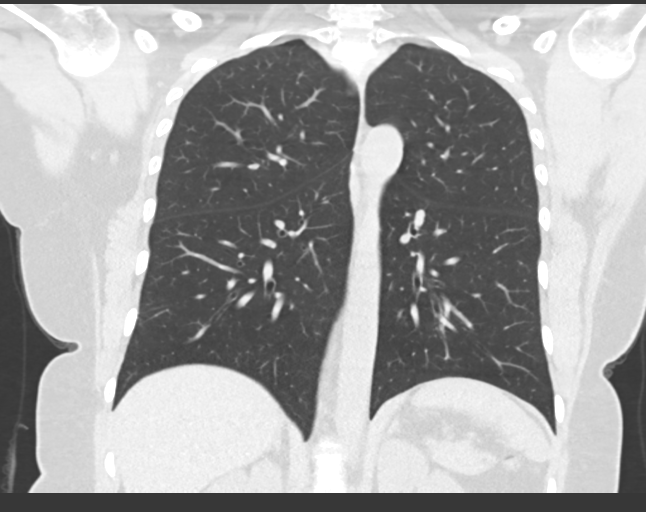

[15 of 36 positions shown; findings below may reference images not displayed]

FINDINGS: Cardiovascular: Normal heart size. No significant pericardial
effusion/thickening. Great vessels are normal in course and caliber.

Mediastinum/Nodes: No discrete thyroid nodules. Unremarkable
esophagus. No pathologically enlarged axillary, mediastinal or hilar
lymph nodes, noting limited sensitivity for the detection of hilar
adenopathy on this noncontrast study. Surgical clips noted in the
low left axilla.

Lungs/Pleura: No pneumothorax. No pleural effusion. No acute
consolidative airspace disease, lung masses or significant pulmonary
nodules. No significant air trapping or evidence of
tracheobronchomalacia on the expiration sequence. Minimal bandlike
subpleural ground-glass opacity in the anterior left upper lobe
compatible with minimal radiation fibrosis (series 4/image 104).
Otherwise no significant regions of subpleural reticulation,
ground-glass attenuation, traction bronchiectasis, architectural
distortion or frank honeycombing.

Upper abdomen: No acute abnormality.

Musculoskeletal: No aggressive appearing focal osseous lesions.
Sclerotic L1 vertebral lesion appears unchanged since 08/11/2016
lateral lumbar spine radiograph, most compatible with a benign bone
island. Minimal thoracic spondylosis. No thoracic fractures.
IMPRESSION: 1. No evidence of metastatic disease in the chest.
2. No evidence of interstitial lung disease. Minimal radiation
fibrosis in the anterior left upper lobe.
3. No fracture or other findings to explain the posterior left rib
pain.

## 2021-04-01 ENCOUNTER — Ambulatory Visit: Payer: Medicare Other

## 2021-04-01 ENCOUNTER — Other Ambulatory Visit: Payer: Self-pay

## 2021-04-01 DIAGNOSIS — M940 Chondrocostal junction syndrome [Tietze]: Secondary | ICD-10-CM

## 2021-04-01 MED ORDER — NABUMETONE 750 MG PO TABS: ORAL_TABLET | ORAL | 0 refills | Status: DC

## 2021-04-01 NOTE — Telephone Encounter (Signed)
Refill request for: Nabumetone 70 mg tabs.  LR 01/28/21, #60, 0 rf LOV 03/04/21 FOV  05/17/21  Please review and advise.  Thanks. Dm/cma

## 2021-04-02 ENCOUNTER — Other Ambulatory Visit: Payer: Self-pay | Admitting: *Deleted

## 2021-04-02 DIAGNOSIS — C50412 Malignant neoplasm of upper-outer quadrant of left female breast: Secondary | ICD-10-CM

## 2021-04-03 ENCOUNTER — Ambulatory Visit: Payer: Medicare Other | Admitting: Adult Health

## 2021-04-03 ENCOUNTER — Inpatient Hospital Stay: Payer: Medicare Other

## 2021-04-03 ENCOUNTER — Other Ambulatory Visit: Payer: Self-pay

## 2021-04-03 ENCOUNTER — Inpatient Hospital Stay: Payer: Medicare Other | Attending: Adult Health | Admitting: Adult Health

## 2021-04-03 VITALS — BP 137/88 | HR 91 | Temp 97.4°F | Resp 18 | Ht 64.0 in | Wt 208.9 lb

## 2021-04-03 DIAGNOSIS — M069 Rheumatoid arthritis, unspecified: Secondary | ICD-10-CM

## 2021-04-03 DIAGNOSIS — C50412 Malignant neoplasm of upper-outer quadrant of left female breast: Secondary | ICD-10-CM

## 2021-04-03 DIAGNOSIS — R197 Diarrhea, unspecified: Secondary | ICD-10-CM | POA: Insufficient documentation

## 2021-04-03 DIAGNOSIS — Z808 Family history of malignant neoplasm of other organs or systems: Secondary | ICD-10-CM | POA: Diagnosis not present

## 2021-04-03 DIAGNOSIS — R748 Abnormal levels of other serum enzymes: Secondary | ICD-10-CM | POA: Diagnosis not present

## 2021-04-03 DIAGNOSIS — Z803 Family history of malignant neoplasm of breast: Secondary | ICD-10-CM | POA: Insufficient documentation

## 2021-04-03 DIAGNOSIS — Z853 Personal history of malignant neoplasm of breast: Secondary | ICD-10-CM | POA: Diagnosis not present

## 2021-04-03 DIAGNOSIS — Z8616 Personal history of COVID-19: Secondary | ICD-10-CM | POA: Diagnosis not present

## 2021-04-03 DIAGNOSIS — Z8052 Family history of malignant neoplasm of bladder: Secondary | ICD-10-CM | POA: Diagnosis not present

## 2021-04-03 DIAGNOSIS — Z800 Family history of malignant neoplasm of digestive organs: Secondary | ICD-10-CM | POA: Diagnosis not present

## 2021-04-03 LAB — CBC WITH DIFFERENTIAL (CANCER CENTER ONLY)
Abs Immature Granulocytes: 0.01 10*3/uL (ref 0.00–0.07)
Basophils Absolute: 0 10*3/uL (ref 0.0–0.1)
Basophils Relative: 0 %
Eosinophils Absolute: 0.3 10*3/uL (ref 0.0–0.5)
Eosinophils Relative: 4 %
HCT: 36.9 % (ref 36.0–46.0)
Hemoglobin: 12.4 g/dL (ref 12.0–15.0)
Immature Granulocytes: 0 %
Lymphocytes Relative: 26 %
Lymphs Abs: 1.7 10*3/uL (ref 0.7–4.0)
MCH: 29.6 pg (ref 26.0–34.0)
MCHC: 33.6 g/dL (ref 30.0–36.0)
MCV: 88.1 fL (ref 80.0–100.0)
Monocytes Absolute: 0.6 10*3/uL (ref 0.1–1.0)
Monocytes Relative: 9 %
Neutro Abs: 4 10*3/uL (ref 1.7–7.7)
Neutrophils Relative %: 61 %
Platelet Count: 318 10*3/uL (ref 150–400)
RBC: 4.19 MIL/uL (ref 3.87–5.11)
RDW: 13 % (ref 11.5–15.5)
WBC Count: 6.7 10*3/uL (ref 4.0–10.5)
nRBC: 0 % (ref 0.0–0.2)

## 2021-04-03 LAB — CMP (CANCER CENTER ONLY)
ALT: 21 U/L (ref 0–44)
AST: 18 U/L (ref 15–41)
Albumin: 4 g/dL (ref 3.5–5.0)
Alkaline Phosphatase: 79 U/L (ref 38–126)
Anion gap: 9 (ref 5–15)
BUN: 12 mg/dL (ref 6–20)
CO2: 22 mmol/L (ref 22–32)
Calcium: 9.5 mg/dL (ref 8.9–10.3)
Chloride: 107 mmol/L (ref 98–111)
Creatinine: 0.89 mg/dL (ref 0.44–1.00)
GFR, Estimated: >60 mL/min (ref >60)
Glucose, Bld: 101 mg/dL — ABNORMAL HIGH (ref 70–99)
Potassium: 4.3 mmol/L (ref 3.5–5.1)
Sodium: 138 mmol/L (ref 135–145)
Total Bilirubin: 0.4 mg/dL (ref 0.3–1.2)
Total Protein: 7.7 g/dL (ref 6.5–8.1)
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
## 2021-04-04 ENCOUNTER — Telehealth: Payer: Self-pay | Admitting: Adult Health

## 2021-04-04 ENCOUNTER — Encounter: Payer: Self-pay | Admitting: Adult Health

## 2021-04-04 NOTE — Telephone Encounter (Signed)
Scheduled per sch msg. Called and left msg. Mailed printout  

## 2021-04-23 ENCOUNTER — Encounter: Payer: Medicare Other | Admitting: Nurse Practitioner

## 2021-04-29 ENCOUNTER — Other Ambulatory Visit: Payer: Self-pay

## 2021-04-29 ENCOUNTER — Ambulatory Visit (HOSPITAL_COMMUNITY)
Admission: RE | Admit: 2021-04-29 | Discharge: 2021-04-29 | Disposition: A | Discharge status: Home / Self Care | Payer: Medicare Other | Source: Ambulatory Visit | Attending: Adult Health | Admitting: Adult Health

## 2021-04-29 ENCOUNTER — Encounter (HOSPITAL_COMMUNITY): Payer: Self-pay

## 2021-04-29 DIAGNOSIS — M069 Rheumatoid arthritis, unspecified: Secondary | ICD-10-CM | POA: Insufficient documentation

## 2021-04-29 DIAGNOSIS — C50412 Malignant neoplasm of upper-outer quadrant of left female breast: Secondary | ICD-10-CM | POA: Insufficient documentation

## 2021-04-29 MED ORDER — IOHEXOL 350 MG/ML SOLN
75.0000 mL | Freq: Once | INTRAVENOUS | Status: AC | PRN
  Administered 2021-04-29: 75 mL via INTRAVENOUS

## 2021-04-30 ENCOUNTER — Ambulatory Visit
Admission: RE | Admit: 2021-04-30 | Discharge: 2021-04-30 | Disposition: A | Discharge status: Home / Self Care | Payer: Medicare Other | Source: Ambulatory Visit | Attending: Oncology | Admitting: Oncology

## 2021-04-30 ENCOUNTER — Ambulatory Visit: Payer: Medicare Other

## 2021-04-30 DIAGNOSIS — C50412 Malignant neoplasm of upper-outer quadrant of left female breast: Secondary | ICD-10-CM

## 2021-05-02 ENCOUNTER — Ambulatory Visit (INDEPENDENT_AMBULATORY_CARE_PROVIDER_SITE_OTHER): Payer: Medicare Other | Admitting: Family

## 2021-05-02 ENCOUNTER — Telehealth: Payer: Self-pay | Admitting: *Deleted

## 2021-05-02 ENCOUNTER — Other Ambulatory Visit: Payer: Self-pay

## 2021-05-02 ENCOUNTER — Encounter: Payer: Self-pay | Admitting: Family

## 2021-05-02 ENCOUNTER — Telehealth: Payer: Self-pay

## 2021-05-02 ENCOUNTER — Telehealth: Payer: Self-pay | Admitting: Neurology

## 2021-05-02 VITALS — BP 120/88 | HR 90 | Temp 96.8°F | Resp 16 | Ht 64.0 in | Wt 210.4 lb

## 2021-05-02 DIAGNOSIS — K582 Mixed irritable bowel syndrome: Secondary | ICD-10-CM

## 2021-05-02 DIAGNOSIS — F331 Major depressive disorder, recurrent, moderate: Secondary | ICD-10-CM

## 2021-05-02 DIAGNOSIS — M069 Rheumatoid arthritis, unspecified: Secondary | ICD-10-CM | POA: Diagnosis not present

## 2021-05-02 DIAGNOSIS — G894 Chronic pain syndrome: Secondary | ICD-10-CM

## 2021-05-02 DIAGNOSIS — F50819 Binge eating disorder, unspecified: Secondary | ICD-10-CM

## 2021-05-02 DIAGNOSIS — G43009 Migraine without aura, not intractable, without status migrainosus: Secondary | ICD-10-CM

## 2021-05-02 DIAGNOSIS — G8929 Other chronic pain: Secondary | ICD-10-CM

## 2021-05-02 DIAGNOSIS — Z91030 Bee allergy status: Secondary | ICD-10-CM

## 2021-05-02 DIAGNOSIS — K219 Gastro-esophageal reflux disease without esophagitis: Secondary | ICD-10-CM

## 2021-05-02 DIAGNOSIS — Z7689 Persons encountering health services in other specified circumstances: Secondary | ICD-10-CM | POA: Diagnosis not present

## 2021-05-02 DIAGNOSIS — F5081 Binge eating disorder: Secondary | ICD-10-CM

## 2021-05-02 DIAGNOSIS — M05731 Rheumatoid arthritis with rheumatoid factor of right wrist without organ or systems involvement: Secondary | ICD-10-CM | POA: Diagnosis not present

## 2021-05-02 DIAGNOSIS — E559 Vitamin D deficiency, unspecified: Secondary | ICD-10-CM | POA: Diagnosis not present

## 2021-05-02 DIAGNOSIS — M05732 Rheumatoid arthritis with rheumatoid factor of left wrist without organ or systems involvement: Secondary | ICD-10-CM

## 2021-05-02 DIAGNOSIS — G4733 Obstructive sleep apnea (adult) (pediatric): Secondary | ICD-10-CM

## 2021-05-02 DIAGNOSIS — Z853 Personal history of malignant neoplasm of breast: Secondary | ICD-10-CM

## 2021-05-02 DIAGNOSIS — F411 Generalized anxiety disorder: Secondary | ICD-10-CM

## 2021-05-02 DIAGNOSIS — M797 Fibromyalgia: Secondary | ICD-10-CM

## 2021-05-02 DIAGNOSIS — F410 Panic disorder [episodic paroxysmal anxiety] without agoraphobia: Secondary | ICD-10-CM

## 2021-05-02 DIAGNOSIS — G43709 Chronic migraine without aura, not intractable, without status migrainosus: Secondary | ICD-10-CM

## 2021-05-02 DIAGNOSIS — M546 Pain in thoracic spine: Secondary | ICD-10-CM

## 2021-05-02 DIAGNOSIS — M810 Age-related osteoporosis without current pathological fracture: Secondary | ICD-10-CM

## 2021-05-02 DIAGNOSIS — N809 Endometriosis, unspecified: Secondary | ICD-10-CM

## 2021-05-02 DIAGNOSIS — F5101 Primary insomnia: Secondary | ICD-10-CM

## 2021-05-02 MED ORDER — SUMATRIPTAN SUCCINATE 50 MG PO TABS: ORAL_TABLET | ORAL | 5 refills | Status: DC

## 2021-05-02 MED ORDER — ONDANSETRON HCL 4 MG PO TABS: 4.0000 mg | ORAL_TABLET | Freq: Three times a day (TID) | ORAL | 5 refills | Status: DC | PRN

## 2021-05-02 MED ORDER — EPINEPHRINE 0.3 MG/0.3ML IJ SOAJ: 0.3000 mg | INTRAMUSCULAR | 1 refills | Status: DC | PRN

## 2021-05-02 MED ORDER — AIMOVIG 140 MG/ML ~~LOC~~ SOAJ: 140.0000 mg | SUBCUTANEOUS | 5 refills | Status: DC

## 2021-05-02 MED ORDER — RIZATRIPTAN BENZOATE 10 MG PO TABS: 10.0000 mg | ORAL_TABLET | ORAL | 5 refills | Status: DC | PRN

## 2021-05-02 MED ORDER — OMEPRAZOLE 40 MG PO CPDR: 40.0000 mg | DELAYED_RELEASE_CAPSULE | Freq: Two times a day (BID) | ORAL | 11 refills | Status: DC

## 2021-05-02 MED ORDER — VITAMIN D (ERGOCALCIFEROL) 1.25 MG (50000 UNIT) PO CAPS: 50000.0000 [IU] | ORAL_CAPSULE | ORAL | 2 refills | Status: DC

## 2021-05-02 MED ORDER — ONDANSETRON 4 MG PO TBDP: 4.0000 mg | ORAL_TABLET | Freq: Three times a day (TID) | ORAL | 5 refills | Status: DC | PRN

## 2021-05-02 MED ORDER — OXYCODONE-ACETAMINOPHEN 5-325 MG PO TABS: 1.0000 | ORAL_TABLET | Freq: Two times a day (BID) | ORAL | 0 refills | Status: AC | PRN

## 2021-05-02 NOTE — Telephone Encounter (Signed)
2.)  Received fax from [place] that ONDANSETRON ODT is NOT covered by patient's insurance.   The Preferred alternative is: Prochlorperazine Malea Meclizine HCL Ondansetron HCL  Please Advise.

## 2021-05-02 NOTE — Telephone Encounter (Signed)
Have the PA Coordinator to start PA

## 2021-05-02 NOTE — Telephone Encounter (Signed)
Pt wants to let jaffe know her insurance does cover the injection aimovig

## 2021-05-02 NOTE — Patient Instructions (Addendum)
Please call Psychiatry service with number provided to schedule appointment for depression/anxiety/panic attacks/insomnia  Insomnia Insomnia is a sleep disorder that makes it difficult to fall asleep or stay asleep. Insomnia can cause fatigue, low energy, difficulty concentrating, mood swings, and poor performance at work or school. There are three different ways to classify insomnia: Difficulty falling asleep. Difficulty staying asleep. Waking up too early in the morning. Any type of insomnia can be long-term (chronic) or short-term (acute). Both are common. Short-term insomnia usually lasts for three months or less. Chronic insomnia occurs at least three times a week for longer than three months. What are the causes? Insomnia may be caused by another condition, situation, or substance, such as: Anxiety. Certain medicines. Gastroesophageal reflux disease (GERD) or other gastrointestinal conditions. Asthma or other breathing conditions. Restless legs syndrome, sleep apnea, or other sleep disorders. Chronic pain. Menopause. Stroke. Abuse of alcohol, tobacco, or illegal drugs. Mental health conditions, such as depression. Caffeine. Neurological disorders, such as Alzheimer's disease. An overactive thyroid (hyperthyroidism). Sometimes, the cause of insomnia may not be known. What increases the risk? Risk factors for insomnia include: Gender. Women are affected more often than men. Age. Insomnia is more common as you get older. Stress. Lack of exercise. Irregular work schedule or working night shifts. Traveling between different time zones. Certain medical and mental health conditions. What are the signs or symptoms? If you have insomnia, the main symptom is having trouble falling asleep or having trouble staying asleep. This may lead to other symptoms, such as: Feeling fatigued or having low energy. Feeling nervous about going to sleep. Not feeling rested in the morning. Having  trouble concentrating. Feeling irritable, anxious, or depressed. How is this diagnosed? This condition may be diagnosed based on: Your symptoms and medical history. Your health care provider may ask about: Your sleep habits. Any medical conditions you have. Your mental health. A physical exam. How is this treated? Treatment for insomnia depends on the cause. Treatment may focus on treating an underlying condition that is causing insomnia. Treatment may also include: Medicines to help you sleep. Counseling or therapy. Lifestyle adjustments to help you sleep better. Follow these instructions at home: Eating and drinking  Limit or avoid alcohol, caffeinated beverages, and cigarettes, especially close to bedtime. These can disrupt your sleep. Do not eat a large meal or eat spicy foods right before bedtime. This can lead to digestive discomfort that can make it hard for you to sleep. Sleep habits  Keep a sleep diary to help you and your health care provider figure out what could be causing your insomnia. Write down: When you sleep. When you wake up during the night. How well you sleep. How rested you feel the next day. Any side effects of medicines you are taking. What you eat and drink. Make your bedroom a dark, comfortable place where it is easy to fall asleep. Put up shades or blackout curtains to block light from outside. Use a white noise machine to block noise. Keep the temperature cool. Limit screen use before bedtime. This includes: Watching TV. Using your smartphone, tablet, or computer. Stick to a routine that includes going to bed and waking up at the same times every day and night. This can help you fall asleep faster. Consider making a quiet activity, such as reading, part of your nighttime routine. Try to avoid taking naps during the day so that you sleep better at night. Get out of bed if you are still awake after 15 minutes  of trying to sleep. Keep the lights down, but  try reading or doing a quiet activity. When you feel sleepy, go back to bed. General instructions Take over-the-counter and prescription medicines only as told by your health care provider. Exercise regularly, as told by your health care provider. Avoid exercise starting several hours before bedtime. Use relaxation techniques to manage stress. Ask your health care provider to suggest some techniques that may work well for you. These may include: Breathing exercises. Routines to release muscle tension. Visualizing peaceful scenes. Make sure that you drive carefully. Avoid driving if you feel very sleepy. Keep all follow-up visits as told by your health care provider. This is important. Contact a health care provider if: You are tired throughout the day. You have trouble in your daily routine due to sleepiness. You continue to have sleep problems, or your sleep problems get worse. Get help right away if: You have serious thoughts about hurting yourself or someone else. If you ever feel like you may hurt yourself or others, or have thoughts about taking your own life, get help right away. You can go to your nearest emergency department or call: Your local emergency services (911 in the [place] A suicide crisis helpline, such as the [facility] at 469-678-6018. This is open 24 hours a day. Summary Insomnia is a sleep disorder that makes it difficult to fall asleep or stay asleep. Insomnia can be long-term (chronic) or short-term (acute). Treatment for insomnia depends on the cause. Treatment may focus on treating an underlying condition that is causing insomnia. Keep a sleep diary to help you and your health care provider figure out what could be causing your insomnia. This information is not intended to replace advice given to you by your health care provider. Make sure you discuss any questions you have with your health care provider. Document Revised: 05/03/2020  Document Reviewed: 05/03/2020 [employer] Patient Education  2022 [employer].
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
## 2021-05-02 NOTE — Telephone Encounter (Signed)
-----   Message from Gardenia Phlegm, NP sent at 05/02/2021  9:50 AM EDT ----- Please let Zareya know results are good, no cancer, she does appear to be constipated.  I often will recommend Senokot S and miralax daily for constipation.  ----- Message ----- From: Interface, Rad Results In Sent: 04/30/2021   3:59 PM EDT To: Gardenia Phlegm, NP

## 2021-05-02 NOTE — Telephone Encounter (Signed)
Received fax from [facility] stating that RIZATRIPTAN 10mg  is NOT covered by patient's insurance.   The Preferred alternative is:  Ergotamine Caffeine Sumatriptan Succinate Ajovy Auto Injector  Please Advise.

## 2021-05-02 NOTE — Telephone Encounter (Signed)
Pended Rx's and sent to [facility] for approval.

## 2021-05-02 NOTE — Telephone Encounter (Signed)
Spoke with pt regarding her recent scans per Wilber Bihari, NP. Pt verbalizes understanding and is appreciative of call.

## 2021-05-02 NOTE — Addendum Note (Signed)
Addended by: Rafael Bihari A on: 05/02/2021 02:44 PM   Modules accepted: Orders

## 2021-05-02 NOTE — Telephone Encounter (Signed)
Ngetich, Dinah C, NP  You 1 hour ago (12:39 PM)   - substitute with Zofran 4 mg tablet one by mouth every 8 hrs as needed for nausea   - discontinue Rizatriptan due to no coverage by insurance  - start on Imitrex 50 mg tablet Take one by mouth every 2 hrs as needed for migraine.Ashur Glatfelter repeat in 2 hrs if migraine persist or recurs maximum dose  200 mg per 24 hrs

## 2021-05-08 ENCOUNTER — Telehealth: Payer: Self-pay

## 2021-05-08 NOTE — Telephone Encounter (Signed)
F/u   [name] (Key: [place]bert [place]) Aimovig 140MG /ML auto-injectors   Form [place] Medicaid Migraine Calcitonin Agents Preventative Prior [place] [phone] phone [phone] fax Created 2 hours ago Sent to Plan 2 hours ago Determination Wait for Determination Please wait for the payer to return a determination.

## 2021-05-08 NOTE — Telephone Encounter (Signed)
New message   Fax sent to the plan Your PA has been faxed to the plan as a paper copy. Please contact the plan directly if you haven't received a determination in a typical timeframe.  You will be notified of the determination electronically and via fax.   Darl Pikes (Key: [place]) Aimovig 140MG /ML auto-injectors   Form [employer] [place] Medicaid Migraine Calcitonin Agents Preventative Prior [place] [phone] phone [phone] fax Created 9 minutes ago Sent to Plan less than a minute ago Determination Wait for Determination Please wait for the payer to return a determination.

## 2021-05-10 ENCOUNTER — Ambulatory Visit: Payer: Medicaid Other | Admitting: Obstetrics and Gynecology

## 2021-05-14 ENCOUNTER — Telehealth: Payer: Self-pay

## 2021-05-14 NOTE — Telephone Encounter (Signed)
Noted  

## 2021-05-14 NOTE — Telephone Encounter (Signed)
I called patient to confirm if medication "Imitrex" was received due to paper printing off 05/02/2021. Patient didn't answer the phone so voicemail was left with office call back number. I will attempt another phone call.

## 2021-05-15 ENCOUNTER — Encounter: Payer: Medicare Other | Admitting: Family Medicine

## 2021-05-16 NOTE — Telephone Encounter (Signed)
Called patient and no answer. Voicemail was left with office call back number.   

## 2021-05-28 ENCOUNTER — Encounter: Payer: Medicare Other | Admitting: Family Medicine
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
## 2021-06-07 ENCOUNTER — Ambulatory Visit: Payer: Medicare Other | Admitting: Family

## 2021-06-07 ENCOUNTER — Encounter: Payer: Self-pay | Admitting: Obstetrics and Gynecology

## 2021-06-07 ENCOUNTER — Other Ambulatory Visit: Payer: Self-pay

## 2021-06-07 ENCOUNTER — Ambulatory Visit (INDEPENDENT_AMBULATORY_CARE_PROVIDER_SITE_OTHER): Payer: Medicare Other | Admitting: Obstetrics and Gynecology

## 2021-06-07 VITALS — BP 138/84 | HR 76 | Ht 64.0 in | Wt 212.0 lb

## 2021-06-07 DIAGNOSIS — Z8742 Personal history of other diseases of the female genital tract: Secondary | ICD-10-CM

## 2021-06-07 DIAGNOSIS — Z8719 Personal history of other diseases of the digestive system: Secondary | ICD-10-CM

## 2021-06-07 DIAGNOSIS — Z01419 Encounter for gynecological examination (general) (routine) without abnormal findings: Secondary | ICD-10-CM

## 2021-06-07 DIAGNOSIS — Z9289 Personal history of other medical treatment: Secondary | ICD-10-CM | POA: Diagnosis not present

## 2021-06-07 DIAGNOSIS — L290 Pruritus ani: Secondary | ICD-10-CM

## 2021-06-07 DIAGNOSIS — Z113 Encounter for screening for infections with a predominantly sexual mode of transmission: Secondary | ICD-10-CM

## 2021-06-07 DIAGNOSIS — Z853 Personal history of malignant neoplasm of breast: Secondary | ICD-10-CM

## 2021-06-07 MED ORDER — HYDROCORTISONE (PERIANAL) 2.5 % EX CREA: TOPICAL_CREAM | Freq: Two times a day (BID) | CUTANEOUS | 1 refills | Status: DC

## 2021-06-07 MED ORDER — BETAMETHASONE VALERATE 0.1 % EX OINT: TOPICAL_OINTMENT | CUTANEOUS | 1 refills | Status: DC

## 2021-06-07 NOTE — Patient Instructions (Signed)

## 2021-06-10 LAB — HSV(HERPES SIMPLEX VRS) I + II AB-IGG
HAV 1 IGG,TYPE SPECIFIC AB: <0.9 index
HSV 2 IGG,TYPE SPECIFIC AB: <0.9 index

## 2021-06-19 ENCOUNTER — Telehealth: Payer: Self-pay

## 2021-06-19 NOTE — Telephone Encounter (Signed)
Patient called in voice mail c/o a pain in her groin area describing it as a sharp pain on her right side.    I called patient and left message to call in her voice mail.

## 2021-06-20 NOTE — Telephone Encounter (Signed)
I called patient again and left another message and I let her know just checking on her and to call if she still needs [place].

## 2021-06-21 NOTE — Telephone Encounter (Signed)
Called patient and no answer. Voicemail was left with office call back number.   

## 2021-07-02 ENCOUNTER — Ambulatory Visit: Payer: Medicare Other | Admitting: Family

## 2021-07-04 NOTE — Telephone Encounter (Signed)
Patient still has not been able to be contacted. Patient missed current appointment 07/02/2021. Patient has another upcoming appointment 07/05/2021. Concern will be addressed at patient appointment.

## 2021-07-05 ENCOUNTER — Other Ambulatory Visit: Payer: Self-pay

## 2021-07-05 ENCOUNTER — Ambulatory Visit (INDEPENDENT_AMBULATORY_CARE_PROVIDER_SITE_OTHER): Payer: Medicare Other | Admitting: Family

## 2021-07-05 ENCOUNTER — Encounter: Payer: Self-pay | Admitting: Family

## 2021-07-05 VITALS — BP 126/86 | HR 94 | Temp 97.5°F | Resp 16 | Ht 64.0 in | Wt 212.4 lb

## 2021-07-05 DIAGNOSIS — M797 Fibromyalgia: Secondary | ICD-10-CM | POA: Diagnosis not present

## 2021-07-05 DIAGNOSIS — G894 Chronic pain syndrome: Secondary | ICD-10-CM | POA: Diagnosis not present

## 2021-07-05 DIAGNOSIS — M79671 Pain in right foot: Secondary | ICD-10-CM | POA: Diagnosis not present

## 2021-07-05 DIAGNOSIS — M79672 Pain in left foot: Secondary | ICD-10-CM | POA: Diagnosis not present

## 2021-07-05 NOTE — Telephone Encounter (Signed)
Patient seen in office today and confirmed that she did receive Imitrex from pharmacy.
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
## 2021-07-09 ENCOUNTER — Other Ambulatory Visit: Payer: Self-pay | Admitting: *Deleted

## 2021-07-09 MED ORDER — OXYCODONE-ACETAMINOPHEN 5-325 MG PO TABS: 1.0000 | ORAL_TABLET | Freq: Two times a day (BID) | ORAL | 0 refills | Status: DC

## 2021-07-09 NOTE — Telephone Encounter (Signed)
Patient requested refill through MyChart.  No Last Epic Refill date Contract Date: 05/02/2021 Pended Rx and sent to [facility] for approval.     07/05/2021 OV Note: 3. Chronic pain syndrome Urine drug test due states had friends over Christmas Holiday who were smoking Marijuana but she had a bite of marijuana candy. - Has Narcotic use contract up to date will need to follow up with pain management specialist for further management of pain.High risk for overdose if using other illicit drugs.   - Urine Drug Screen w/Alc, no confirm([facility])

## 2021-07-11 NOTE — Telephone Encounter (Signed)
Sorry will not send another script Per the Narcotic use contract signed you can only fill medication in one pharmacy.

## 2021-07-11 NOTE — Addendum Note (Signed)
Addended by: Rafael Bihari A on: 07/11/2021 01:21 PM   Modules accepted: Orders

## 2021-07-11 NOTE — Telephone Encounter (Signed)
Patient called and stated that she had to go to [place] unexpectedly. Stated that she has someone there in bad health and it was last moment.   Stated that she was not able to pick up her Oxycodone Rx from [facility] in [place] that was sent 07/09/21.   Pended Rx and sent to [facility] for approval.

## 2021-07-11 NOTE — Telephone Encounter (Signed)
Patient notified and agreed. Stated that she will just pick it up when she gets back into town.

## 2021-07-12 LAB — DRUG MONITOR, PANEL 1, SCREEN, URINE
Amphetamines: NEGATIVE ng/mL (ref <500)
Barbiturates: NEGATIVE ng/mL (ref <300)
Benzodiazepines: NEGATIVE ng/mL (ref <100)
Cocaine Metabolite: NEGATIVE ng/mL (ref <150)
Creatinine: 64.1 mg/dL (ref >=20.0)
Marijuana Metabolite: POSITIVE ng/mL — AB (ref <20)
Methadone Metabolite: NEGATIVE ng/mL (ref <100)
Opiates: NEGATIVE ng/mL (ref <100)
Oxidant: NEGATIVE ug/mL (ref <200)
Oxycodone: NEGATIVE ng/mL (ref <100)
Phencyclidine: NEGATIVE ng/mL (ref <25)
pH: 5.2 (ref 4.5–9.0)

## 2021-07-12 LAB — TEST AUTHORIZATION

## 2021-07-12 LAB — DM TEMPLATE

## 2021-07-12 LAB — DRUG MONITOR, ALCOHOLMETAB, QN, URINE
ETG: NEGATIVE ng/mL (ref <500)
ETS: NEGATIVE ng/mL (ref <100)

## 2021-07-16 ENCOUNTER — Other Ambulatory Visit: Payer: Self-pay | Admitting: Family

## 2021-07-16 DIAGNOSIS — G8929 Other chronic pain: Secondary | ICD-10-CM

## 2021-07-16 DIAGNOSIS — G43709 Chronic migraine without aura, not intractable, without status migrainosus: Secondary | ICD-10-CM

## 2021-07-16 DIAGNOSIS — M069 Rheumatoid arthritis, unspecified: Secondary | ICD-10-CM

## 2021-07-16 DIAGNOSIS — M797 Fibromyalgia: Secondary | ICD-10-CM

## 2021-07-16 DIAGNOSIS — G894 Chronic pain syndrome: Secondary | ICD-10-CM

## 2021-07-16 DIAGNOSIS — M546 Pain in thoracic spine: Secondary | ICD-10-CM

## 2021-08-29 ENCOUNTER — Ambulatory Visit: Payer: Medicare Other | Admitting: Podiatry
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
## 2021-09-02 ENCOUNTER — Ambulatory Visit (INDEPENDENT_AMBULATORY_CARE_PROVIDER_SITE_OTHER): Payer: Medicare Other | Admitting: Neurology

## 2021-09-02 ENCOUNTER — Other Ambulatory Visit: Payer: Self-pay

## 2021-09-02 ENCOUNTER — Encounter: Payer: Self-pay | Admitting: Neurology

## 2021-09-02 VITALS — BP 146/87 | HR 97 | Ht 64.0 in | Wt 211.6 lb

## 2021-09-02 DIAGNOSIS — G4733 Obstructive sleep apnea (adult) (pediatric): Secondary | ICD-10-CM | POA: Diagnosis not present

## 2021-09-02 DIAGNOSIS — G43009 Migraine without aura, not intractable, without status migrainosus: Secondary | ICD-10-CM

## 2021-09-02 DIAGNOSIS — G3184 Mild cognitive impairment, so stated: Secondary | ICD-10-CM | POA: Diagnosis not present

## 2021-09-02 MED ORDER — TOPIRAMATE 50 MG PO TABS: 50.0000 mg | ORAL_TABLET | Freq: Every day | ORAL | 5 refills | Status: DC

## 2021-09-02 NOTE — Patient Instructions (Signed)
Take topiramate 50mg  - take 1/2 tablet at bedtime for one week, then increase to 1 tablet at bedtime.  If no improvement in 5 weeks, contact me and we can increase dose Rizatriptan as needed.  Limit use of pain relievers to no more than 2 days out of week to prevent risk of rebound or medication-overuse headache. Keep headache diary Follow up 4 months.

## 2021-09-03 ENCOUNTER — Ambulatory Visit (INDEPENDENT_AMBULATORY_CARE_PROVIDER_SITE_OTHER): Payer: Medicare Other

## 2021-09-03 ENCOUNTER — Ambulatory Visit (INDEPENDENT_AMBULATORY_CARE_PROVIDER_SITE_OTHER): Payer: Medicare Other | Admitting: Podiatry

## 2021-09-03 ENCOUNTER — Ambulatory Visit: Payer: Medicare Other

## 2021-09-03 ENCOUNTER — Encounter: Payer: Self-pay | Admitting: Podiatry

## 2021-09-03 DIAGNOSIS — M722 Plantar fascial fibromatosis: Secondary | ICD-10-CM

## 2021-09-03 DIAGNOSIS — L603 Nail dystrophy: Secondary | ICD-10-CM

## 2021-09-03 NOTE — Progress Notes (Signed)
SITUATION Reason for Consult: Evaluation for Bilateral Custom Foot Orthoses Patient / Caregiver Report: Patient is ready for foot orthotics  OBJECTIVE DATA: Patient History / Diagnosis:    ICD-10-CM   1. Plantar fasciitis  M72.2       Current or Previous Devices: None and no history  Foot Examination: Skin presentation:   Intact Ulcers & Callousing:   None and no history Toe / Foot Deformities:  None Weight Bearing Presentation:  Rectus Sensation:    Intact  ORTHOTIC RECOMMENDATION Recommended Device: 1x pair of custom functional foot orthotics  GOALS OF ORTHOSES - Reduce Pain - Prevent Foot Deformity - Prevent Progression of Further Foot Deformity - Relieve Pressure - Improve the Overall Biomechanical Function of the Foot and Lower Extremity.  ACTIONS PERFORMED Patient was casted for Foot Orthoses via crush box. Procedure was explained and patient tolerated procedure well. All questions were answered and concerns addressed.  PLAN Potential out of pocket cost was communicated to patient. Casts are to be sent to [facility] for fabrication. Patient is to be called for fitting when devices are ready.

## 2021-09-04 ENCOUNTER — Telehealth: Payer: Self-pay
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
## 2021-09-04 NOTE — Telephone Encounter (Signed)
New message   [name] Key: [id number] - PA Case ID: [id number] - Rx #: [id number] Need help? Call us at [phone] Outcome Approvedtoday CaseId:[id number];Status:Approved;Review Type:Prior Auth;Coverage Start Date:08/05/2021;Coverage End Date:09/04/2022; Drug Topiramate 50MG  tablets Form [employer] Electronic PA Form [phone] NCPDP) Original Claim Info [id number]

## 2021-09-05 ENCOUNTER — Telehealth: Payer: Self-pay

## 2021-09-05 NOTE — Telephone Encounter (Signed)
Casts sent to central fabrication °

## 2021-09-06 ENCOUNTER — Encounter: Payer: Medicare Other | Admitting: Physical Medicine and Rehabilitation

## 2021-09-07 DIAGNOSIS — M797 Fibromyalgia: Secondary | ICD-10-CM

## 2021-09-07 DIAGNOSIS — F324 Major depressive disorder, single episode, in partial remission: Secondary | ICD-10-CM

## 2021-09-07 DIAGNOSIS — M069 Rheumatoid arthritis, unspecified: Secondary | ICD-10-CM

## 2021-09-09 MED ORDER — VENLAFAXINE HCL ER 150 MG PO CP24: ORAL_CAPSULE | ORAL | 1 refills | Status: DC

## 2021-09-09 NOTE — Telephone Encounter (Signed)
Patient requested refill. Has an upcoming appointment.  ?Pended Rx and sent to [facility] for approval.  ?

## 2021-10-02 ENCOUNTER — Inpatient Hospital Stay: Payer: Medicare Other | Admitting: Adult Health

## 2021-10-02 ENCOUNTER — Telehealth: Payer: Self-pay | Admitting: Adult Health

## 2021-10-02 NOTE — Telephone Encounter (Signed)
.  Called pt per 3/29 inbasket , Patient was unavailable, a message with appt time and date was left with number on file.  ? ?

## 2021-10-04 ENCOUNTER — Telehealth: Payer: Self-pay | Admitting: Podiatry

## 2021-10-04 NOTE — Telephone Encounter (Signed)
Lvm to sch to opu ?

## 2021-10-21 ENCOUNTER — Other Ambulatory Visit: Payer: Self-pay

## 2021-10-21 ENCOUNTER — Encounter (HOSPITAL_BASED_OUTPATIENT_CLINIC_OR_DEPARTMENT_OTHER): Payer: Self-pay

## 2021-10-21 ENCOUNTER — Emergency Department (HOSPITAL_BASED_OUTPATIENT_CLINIC_OR_DEPARTMENT_OTHER)
Admission: EM | Admit: 2021-10-21 | Discharge: 2021-10-22 | Disposition: A | Discharge status: Other Acute Inpatient Hospital | Payer: Medicare Other | Source: Home / Self Care | Attending: Emergency Medicine | Admitting: Emergency Medicine

## 2021-10-21 ENCOUNTER — Emergency Department (HOSPITAL_BASED_OUTPATIENT_CLINIC_OR_DEPARTMENT_OTHER): Payer: Medicare Other

## 2021-10-21 DIAGNOSIS — F332 Major depressive disorder, recurrent severe without psychotic features: Secondary | ICD-10-CM | POA: Diagnosis not present

## 2021-10-21 DIAGNOSIS — Z20822 Contact with and (suspected) exposure to covid-19: Secondary | ICD-10-CM | POA: Insufficient documentation

## 2021-10-21 DIAGNOSIS — R072 Precordial pain: Secondary | ICD-10-CM | POA: Insufficient documentation

## 2021-10-21 DIAGNOSIS — R45851 Suicidal ideations: Secondary | ICD-10-CM

## 2021-10-21 DIAGNOSIS — F419 Anxiety disorder, unspecified: Secondary | ICD-10-CM | POA: Insufficient documentation

## 2021-10-21 DIAGNOSIS — F32A Depression, unspecified: Secondary | ICD-10-CM

## 2021-10-21 DIAGNOSIS — Y900 Blood alcohol level of less than 20 mg/100 ml: Secondary | ICD-10-CM | POA: Insufficient documentation

## 2021-10-21 LAB — CBC WITH DIFFERENTIAL/PLATELET
Abs Immature Granulocytes: 0.03 10*3/uL (ref 0.00–0.07)
Basophils Absolute: 0 10*3/uL (ref 0.0–0.1)
Basophils Relative: 0 %
Eosinophils Absolute: 0.2 10*3/uL (ref 0.0–0.5)
Eosinophils Relative: 3 %
HCT: 38.7 % (ref 36.0–46.0)
Hemoglobin: 13.5 g/dL (ref 12.0–15.0)
Immature Granulocytes: 0 %
Lymphocytes Relative: 27 %
Lymphs Abs: 1.9 10*3/uL (ref 0.7–4.0)
MCH: 30.1 pg (ref 26.0–34.0)
MCHC: 34.9 g/dL (ref 30.0–36.0)
MCV: 86.4 fL (ref 80.0–100.0)
Monocytes Absolute: 0.5 10*3/uL (ref 0.1–1.0)
Monocytes Relative: 7 %
Neutro Abs: 4.4 10*3/uL (ref 1.7–7.7)
Neutrophils Relative %: 63 %
Platelets: 315 10*3/uL (ref 150–400)
RBC: 4.48 MIL/uL (ref 3.87–5.11)
RDW: 13.2 % (ref 11.5–15.5)
WBC: 7 10*3/uL (ref 4.0–10.5)
nRBC: 0 % (ref 0.0–0.2)

## 2021-10-21 LAB — BASIC METABOLIC PANEL
Anion gap: 8 (ref 5–15)
BUN: 11 mg/dL (ref 6–20)
CO2: 24 mmol/L (ref 22–32)
Calcium: 9.3 mg/dL (ref 8.9–10.3)
Chloride: 107 mmol/L (ref 98–111)
Creatinine, Ser: 0.79 mg/dL (ref 0.44–1.00)
GFR, Estimated: >60 mL/min (ref >60)
Glucose, Bld: 108 mg/dL — ABNORMAL HIGH (ref 70–99)
Potassium: 3.4 mmol/L — ABNORMAL LOW (ref 3.5–5.1)
Sodium: 139 mmol/L (ref 135–145)

## 2021-10-21 LAB — RESP PANEL BY RT-PCR (FLU A&B, COVID) ARPGX2
Influenza A by PCR: NEGATIVE
Influenza B by PCR: NEGATIVE
SARS Coronavirus 2 by RT PCR: NEGATIVE

## 2021-10-21 LAB — HEPATIC FUNCTION PANEL
ALT: 21 U/L (ref 0–44)
AST: 21 U/L (ref 15–41)
Albumin: 3.9 g/dL (ref 3.5–5.0)
Alkaline Phosphatase: 86 U/L (ref 38–126)
Bilirubin, Direct: 0.1 mg/dL (ref 0.0–0.2)
Indirect Bilirubin: 0.7 mg/dL (ref 0.3–0.9)
Total Bilirubin: 0.8 mg/dL (ref 0.3–1.2)
Total Protein: 7.9 g/dL (ref 6.5–8.1)

## 2021-10-21 LAB — RAPID URINE DRUG SCREEN, HOSP PERFORMED
Amphetamines: NOT DETECTED
Barbiturates: NOT DETECTED
Benzodiazepines: NOT DETECTED
Cocaine: NOT DETECTED
Opiates: NOT DETECTED
Tetrahydrocannabinol: POSITIVE — AB

## 2021-10-21 LAB — PREGNANCY, URINE: Preg Test, Ur: NEGATIVE

## 2021-10-21 LAB — ETHANOL: Alcohol, Ethyl (B): <10 mg/dL (ref <10)

## 2021-10-21 LAB — TROPONIN I (HIGH SENSITIVITY): Troponin I (High Sensitivity): <2 ng/L (ref <18)

## 2021-10-21 MED ORDER — LORAZEPAM 2 MG/ML IJ SOLN
1.0000 mg | Freq: Once | INTRAMUSCULAR | Status: AC
  Administered 2021-10-21: 1 mg via INTRAVENOUS
  Filled 2021-10-21: qty 1

## 2021-10-21 MED ORDER — ONDANSETRON 4 MG PO TBDP
4.0000 mg | ORAL_TABLET | Freq: Once | ORAL | Status: AC
  Administered 2021-10-21: 4 mg via ORAL
  Filled 2021-10-21: qty 1

## 2021-10-21 MED ORDER — LORAZEPAM 1 MG PO TABS
1.0000 mg | ORAL_TABLET | Freq: Once | ORAL | Status: AC
  Administered 2021-10-21: 1 mg via ORAL
  Filled 2021-10-21: qty 1

## 2021-10-21 NOTE — ED Notes (Signed)
Per TTS [name], FNP recommends inpatient psychiatric treatment. AC at [facility] will review for possible admission." ?

## 2021-10-21 NOTE — ED Notes (Signed)
Called and spoke with Hoyle Sauer who is the off going PA for [facility]. I was informed this pt would have her TTS done next.  ?

## 2021-10-21 NOTE — ED Notes (Signed)
Introduced myself to patient. Patient endorses suicidal thoughts without a plan. Patient states she does not like the situation she is currently in, relating to her health and body.  ?
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
## 2021-10-21 NOTE — ED Notes (Signed)
Pt anxious reported to RN  ?TV on for comfort. Chips and ginger ale given per request ?

## 2021-10-21 NOTE — ED Notes (Signed)
Pt c/o "feeling queasy" after returning from bathroom. Dr. Sherry Ruffing made aware ?

## 2021-10-21 NOTE — ED Provider Notes (Signed)
?[facility]PART[facility] ?Provider Note ? ? ?CSN: [id number] ?Arrival date & time: 10/21/21  1118 ? ?  ? ?History ? ?Chief Complaint  ?Patient presents with  ? Chest Pain  ? Suicidal  ? ? ?Kenni Newton is a 46 y.o. female. ? ?The history is provided by the patient.  ?Chest Pain ?Pain location:  Substernal area ?Pain quality: aching   ?Pain radiates to:  Does not radiate ?Pain severity:  Mild ?Onset quality:  Gradual ?Duration:  4 days ?Timing:  Intermittent ?Progression:  Waxing and waning ?Context: stress (ANXIETY)   ?Relieved by:  Nothing ?Worsened by:  Nothing ?Associated symptoms: anxiety (panic attacks, suicidal thought)   ?Associated symptoms: no abdominal pain, no altered mental status, no anorexia, no back pain, no claudication, no cough, no diaphoresis, no dizziness, no fatigue, no fever, no heartburn, no lower extremity edema, no nausea, no near-syncope, no numbness, no orthopnea, no palpitations, no PND, no shortness of breath, no syncope, no vomiting and no weakness   ?Risk factors: no coronary artery disease, no diabetes mellitus, no high cholesterol, no hypertension, no prior DVT/PE and no smoking   ? ?  ? ?Home Medications ?Prior to Admission medications   ?Medication Sig Start Date End Date Taking? Authorizing Provider  ?albuterol (VENTOLIN HFA) 108 (90 Base) MCG/ACT inhaler Inhale into the lungs every 6 (six) hours as needed for wheezing or shortness of breath.    [provider]  ?betamethasone valerate ointment (VALISONE) 0.1 % Place a pea sized amount topically BID for up to 1 week as needed. Not for daily long term use. 06/07/21   [name], MD  ?diphenhydrAMINE HCl (BENADRYL PO) Take by mouth as needed.    [provider]  ?EPINEPHrine 0.3 mg/0.3 mL IJ SOAJ injection Inject 0.3 mg into the muscle as needed for anaphylaxis. 05/02/21   Ngetich, Nelda Bucks, NP  ?Erenumab-aooe (AIMOVIG) 140 MG/ML SOAJ Inject 140 mg into the skin every 28  (twenty-eight) days. ?Patient not taking: Reported on 09/02/2021 05/02/21   Pieter Partridge, DO  ?folic acid (FOLVITE) 1 MG tablet 2 tablets 04/15/19   [provider]  ?gabapentin (NEURONTIN) 100 MG capsule Take 2 capsules by mouth daily. 03/29/18   [provider]  ?hydrocortisone (ANUSOL-HC) 2.5 % rectal cream Place rectally 2 (two) times daily. Use prn for up to 2 weeks 06/07/21   [name], MD  ?ipratropium-albuterol (DUONEB) 0.5-2.5 (3) MG/3ML SOLN Take 3 mLs by nebulization every 6 (six) hours as needed.    [provider]  ?methotrexate 2.5 MG tablet Take 6 tablets by mouth once a week.    [provider]  ?nabumetone (RELAFEN) 750 MG tablet TAKE 1 TABLET(750 MG) BY MOUTH TWICE DAILY 04/01/21   Kennyth Arnold, FNP  ?omeprazole (PRILOSEC) 40 MG capsule Take 1 capsule (40 mg total) by mouth 2 (two) times daily. Take in the morning and at dinnertime. 05/02/21   Ngetich, Dinah C, NP  ?ondansetron (ZOFRAN) 4 MG tablet Take 1 tablet (4 mg total) by mouth every 8 (eight) hours as needed for nausea or vomiting. 05/02/21   Ngetich, Dinah C, NP  ?sulfaSALAzine (AZULFIDINE) 500 MG tablet Take 500 mg by mouth at bedtime. 08/21/20   [provider]  ?SUMAtriptan (IMITREX) 50 MG tablet Take one by mouth every 2 hrs as needed for migraine.may repeat in 2 hrs if migraine persist or recurs maximum dose  200 mg per 24 05/02/21   Ngetich, Dinah C, NP  ?  topiramate (TOPAMAX) 50 MG tablet Take 1 tablet (50 mg total) by mouth at bedtime. 09/02/21   Pieter Partridge, DO  ?venlafaxine XR (EFFEXOR-XR) 150 MG 24 hr capsule Take one capsule by mouth once daily with Breakfast 09/09/21   Ngetich, Nelda Bucks, NP  ?Vitamin D, Ergocalciferol, (DRISDOL) 1.25 MG (50000 UNIT) CAPS capsule Take 1 capsule (50,000 Units total) by mouth every 7 (seven) days. 05/02/21   Ngetich, Nelda Bucks, NP  ?   ? ?Allergies    ?Amoxicillin, Diflucan [fluconazole], Hydroxychloroquine sulfate, and Plaquenil  [hydroxychloroquine]   ? ?Review of Systems   ?Review of Systems  ?Constitutional:  Negative for diaphoresis, fatigue and fever.  ?Respiratory:  Negative for cough and shortness of breath.   ?Cardiovascular:  Positive for chest pain. Negative for palpitations, orthopnea, claudication, syncope, PND and near-syncope.  ?Gastrointestinal:  Negative for abdominal pain, anorexia, heartburn, nausea and vomiting.  ?Musculoskeletal:  Negative for back pain.  ?Neurological:  Negative for dizziness, weakness and numbness.  ? ?Physical Exam ?Updated Vital Signs ? ?ED Triage Vitals  ?Enc Vitals Group  ?   BP 10/21/21 1130 (!) 138/97  ?   Pulse Rate 10/21/21 1130 84  ?   Resp 10/21/21 1130 12  ?   Temp 10/21/21 1132 97.8 ?F (36.6 ?C)  ?   Temp Source 10/21/21 1132 Oral  ?   SpO2 10/21/21 1130 100 %  ?   Weight 10/21/21 1126 210 lb (95.3 kg)  ?   Height 10/21/21 1126 '5\' 4"'$  (1.626 m)  ?   Head Circumference --   ?   Peak Flow --   ?   Pain Score 10/21/21 1126 8  ?   Pain Loc --   ?   Pain Edu? --   ?   Excl. in [place]? --   ? ? ?Physical Exam ?Vitals and nursing note reviewed.  ?Constitutional:   ?   General: She is not in acute distress. ?   Appearance: She is well-developed. She is not ill-appearing.  ?HENT:  ?   Head: Normocephalic and atraumatic.  ?   Nose: Nose normal.  ?   Mouth/Throat:  ?   Mouth: Mucous membranes are moist.  ?Eyes:  ?   Extraocular Movements: Extraocular movements intact.  ?   Conjunctiva/sclera: Conjunctivae normal.  ?   Pupils: Pupils are equal, round, and reactive to light.  ?Cardiovascular:  ?   Rate and Rhythm: Normal rate and regular rhythm.  ?   Pulses: Normal pulses.  ?   Heart sounds: Normal heart sounds. No murmur heard. ?Pulmonary:  ?   Effort: Pulmonary effort is normal. No respiratory distress.  ?   Breath sounds: Normal breath sounds.  ?Abdominal:  ?   Palpations: Abdomen is soft.  ?   Tenderness: There is no abdominal tenderness.  ?Musculoskeletal:     ?   General: No swelling.  ?   Cervical  back: Normal range of motion and neck supple.  ?Skin: ?   General: Skin is warm and dry.  ?   Capillary Refill: Capillary refill takes less than 2 seconds.  ?Neurological:  ?   General: No focal deficit present.  ?   Mental Status: She is alert.  ?Psychiatric:     ?   Mood and Affect: Mood normal.  ? ? ?ED Results / Procedures / Treatments   ?Labs ?(all labs ordered are listed, but only abnormal results are displayed) ?Labs Reviewed  ?BASIC METABOLIC PANEL - Abnormal; Notable  for the following components:  ?    Result Value  ? Potassium 3.4 (*)   ? Glucose, Bld 108 (*)   ? All other components within normal limits  ?RESP PANEL BY RT-PCR (FLU A&B, COVID) ARPGX2  ?CBC WITH DIFFERENTIAL/PLATELET  ?ETHANOL  ?HEPATIC FUNCTION PANEL  ?RAPID URINE DRUG SCREEN, HOSP PERFORMED  ?PREGNANCY, URINE  ?TROPONIN I (HIGH SENSITIVITY)  ? ? ?EKG ?EKG Interpretation ? ?Date/Time:  Monday October 21 2021 11:29:00 EDT ?Ventricular Rate:  90 ?PR Interval:  163 ?QRS Duration: 72 ?QT Interval:  356 ?QTC Calculation: 436 ?R Axis:   73 ?Text Interpretation: Sinus rhythm Confirmed by Lennice Sites ([id number]) on 10/21/2021 11:30:01 AM ? ?Radiology ?DG Chest Portable 1 View ? ?Result Date: 10/21/2021 ?CLINICAL DATA:  Chest pain.  Shortness of breath.  Anxiety. EXAM: PORTABLE CHEST 1 VIEW COMPARISON:  11/21/2019 FINDINGS: The heart size and mediastinal contours are within normal limits. Both lungs are clear. The visualized skeletal structures are unremarkable. IMPRESSION: No active disease. Electronically Signed   By: Marlaine Hind M.D.   On: 10/21/2021 12:07   ? ?Procedures ?Procedures  ? ? ?Medications Ordered in ED ?Medications  ?LORazepam (ATIVAN) injection 1 mg (1 mg Intravenous Given 10/21/21 1149)  ? ? ?ED Course/ Medical Decision Making/ A&P ?  ?                        ?Medical Decision Making ?Amount and/or Complexity of Data Reviewed ?Labs: ordered. ?Radiology: ordered. ? ?Risk ?Prescription drug management. ? ? ?[place] Pelto is here  with chest pain.  Having anxiety attacks she thinks but worse than normal.  Taking her Effexor without much relief.  Having suicidal thoughts with no active plans.  No cardiac risk factors.  No PE risk factors.  EKG per my review and i

## 2021-10-21 NOTE — ED Triage Notes (Signed)
Pt arrives to ED with reports of anxiety attack on Friday states that she has history of same, usually symptoms resolve however is is continuing to have anxiety, SOB, CP. Pt reports is central, denies any radiation.  Some sweating and nausea on Friday feeling like she was going to pass out with radiation to left side, states she has had none of that since. Pt takes Effexor for anxiety, states her medications are not working as well as they use to. Pt also endorses some Suicidal thoughts over the past several days. Pt denies an active plan.  ?

## 2021-10-21 NOTE — ED Notes (Addendum)
Patient dressed in burgundy scrubs, yellow socks ? ?Patient belongings : ?1 phone (Patient sticker on phone) ?Pink case with earrings, bracelets (Patient sticker on top) ?Shoes ?Pants ?Socks ?Shirt ?Wig  ? ?all belongings placed in patient bag with patient sticker and placed in locker above Tech computer across from room 9    ? ? ? ? ? ? ?

## 2021-10-21 NOTE — ED Notes (Signed)
Updated Claiborne Billings, mom on patients care. [phone]. ?

## 2021-10-21 NOTE — ED Notes (Signed)
Mother at bedside visiting patient for 30 minutes ?

## 2021-10-21 NOTE — ED Notes (Signed)
Pt talking to TTS at this time.  ?

## 2021-10-21 NOTE — ED Notes (Signed)
TTS machine in room ?

## 2021-10-22 ENCOUNTER — Inpatient Hospital Stay (HOSPITAL_COMMUNITY)
Admission: AD | Admit: 2021-10-22 | Discharge: 2021-10-24 | DRG: 885 | Disposition: A | Discharge status: Home / Self Care | Payer: Medicare Other | Source: Intra-hospital | Attending: Psychiatry | Admitting: Psychiatry

## 2021-10-22 ENCOUNTER — Encounter (HOSPITAL_COMMUNITY): Payer: Self-pay | Admitting: Nurse Practitioner

## 2021-10-22 DIAGNOSIS — Z880 Allergy status to penicillin: Secondary | ICD-10-CM

## 2021-10-22 DIAGNOSIS — R45851 Suicidal ideations: Secondary | ICD-10-CM | POA: Diagnosis present

## 2021-10-22 DIAGNOSIS — F4001 Agoraphobia with panic disorder: Secondary | ICD-10-CM | POA: Diagnosis present

## 2021-10-22 DIAGNOSIS — Z923 Personal history of irradiation: Secondary | ICD-10-CM | POA: Diagnosis not present

## 2021-10-22 DIAGNOSIS — F410 Panic disorder [episodic paroxysmal anxiety] without agoraphobia: Secondary | ICD-10-CM | POA: Diagnosis present

## 2021-10-22 DIAGNOSIS — Z9151 Personal history of suicidal behavior: Secondary | ICD-10-CM

## 2021-10-22 DIAGNOSIS — F4310 Post-traumatic stress disorder, unspecified: Secondary | ICD-10-CM

## 2021-10-22 DIAGNOSIS — M797 Fibromyalgia: Secondary | ICD-10-CM | POA: Diagnosis present

## 2021-10-22 DIAGNOSIS — Z20822 Contact with and (suspected) exposure to covid-19: Secondary | ICD-10-CM | POA: Diagnosis present

## 2021-10-22 DIAGNOSIS — Z853 Personal history of malignant neoplasm of breast: Secondary | ICD-10-CM | POA: Diagnosis not present

## 2021-10-22 DIAGNOSIS — Z79899 Other long term (current) drug therapy: Secondary | ICD-10-CM | POA: Diagnosis not present

## 2021-10-22 DIAGNOSIS — F411 Generalized anxiety disorder: Secondary | ICD-10-CM | POA: Diagnosis present

## 2021-10-22 DIAGNOSIS — R5383 Other fatigue: Secondary | ICD-10-CM | POA: Diagnosis present

## 2021-10-22 DIAGNOSIS — M069 Rheumatoid arthritis, unspecified: Secondary | ICD-10-CM | POA: Diagnosis present

## 2021-10-22 DIAGNOSIS — F332 Major depressive disorder, recurrent severe without psychotic features: Principal | ICD-10-CM

## 2021-10-22 DIAGNOSIS — Z90710 Acquired absence of both cervix and uterus: Secondary | ICD-10-CM | POA: Diagnosis not present

## 2021-10-22 DIAGNOSIS — G894 Chronic pain syndrome: Secondary | ICD-10-CM | POA: Diagnosis present

## 2021-10-22 MED ORDER — TRIAMCINOLONE ACETONIDE 0.1 % EX OINT
TOPICAL_OINTMENT | Freq: Two times a day (BID) | CUTANEOUS | Status: DC
Start: 2021-10-22 — End: 2021-10-23
  Filled 2021-10-22: qty 15

## 2021-10-22 MED ORDER — DULOXETINE HCL 30 MG PO CPEP
30.0000 mg | ORAL_CAPSULE | Freq: Every day | ORAL | Status: DC
  Administered 2021-10-24: 30 mg via ORAL
  Filled 2021-10-22 (×4): qty 1

## 2021-10-22 MED ORDER — TRAZODONE HCL 50 MG PO TABS
50.0000 mg | ORAL_TABLET | Freq: Every evening | ORAL | Status: DC | PRN
  Administered 2021-10-22 – 2021-10-23 (×2): 50 mg via ORAL
  Filled 2021-10-22 (×2): qty 1

## 2021-10-22 MED ORDER — VENLAFAXINE HCL ER 37.5 MG PO CP24
37.5000 mg | ORAL_CAPSULE | Freq: Every day | ORAL | Status: DC
  Filled 2021-10-22 (×2): qty 1

## 2021-10-22 MED ORDER — DULOXETINE HCL 20 MG PO CPEP
20.0000 mg | ORAL_CAPSULE | Freq: Every day | ORAL | Status: AC
  Administered 2021-10-22 – 2021-10-23 (×2): 20 mg via ORAL
  Filled 2021-10-22 (×2): qty 1

## 2021-10-22 MED ORDER — ALBUTEROL SULFATE HFA 108 (90 BASE) MCG/ACT IN AERS: 2.0000 | INHALATION_SPRAY | Freq: Four times a day (QID) | RESPIRATORY_TRACT | Status: DC | PRN

## 2021-10-22 MED ORDER — PANTOPRAZOLE SODIUM 40 MG PO TBEC
40.0000 mg | DELAYED_RELEASE_TABLET | Freq: Every day | ORAL | Status: DC
  Administered 2021-10-22 – 2021-10-24 (×3): 40 mg via ORAL
  Filled 2021-10-22 (×7): qty 1

## 2021-10-22 MED ORDER — GABAPENTIN 400 MG PO CAPS
400.0000 mg | ORAL_CAPSULE | Freq: Three times a day (TID) | ORAL | Status: DC
  Administered 2021-10-22 – 2021-10-24 (×7): 400 mg via ORAL
  Filled 2021-10-22 (×13): qty 1

## 2021-10-22 MED ORDER — EPINEPHRINE 0.3 MG/0.3ML IJ SOAJ: 0.3000 mg | Freq: Once | INTRAMUSCULAR | Status: DC | PRN

## 2021-10-22 MED ORDER — TOPIRAMATE 25 MG PO TABS
50.0000 mg | ORAL_TABLET | Freq: Every day | ORAL | Status: DC
  Administered 2021-10-22 – 2021-10-23 (×2): 50 mg via ORAL
  Filled 2021-10-22 (×6): qty 2

## 2021-10-22 MED ORDER — VENLAFAXINE HCL ER 75 MG PO CP24
112.5000 mg | ORAL_CAPSULE | Freq: Every day | ORAL | Status: AC
  Administered 2021-10-22: 112.5 mg via ORAL
  Filled 2021-10-22: qty 1

## 2021-10-22 MED ORDER — METHOTREXATE 2.5 MG PO TABS: 15.0000 mg | ORAL_TABLET | ORAL | Status: DC

## 2021-10-22 MED ORDER — ACETAMINOPHEN 325 MG PO TABS
650.0000 mg | ORAL_TABLET | Freq: Four times a day (QID) | ORAL | Status: DC | PRN
  Administered 2021-10-23: 650 mg via ORAL
  Filled 2021-10-22: qty 2

## 2021-10-22 MED ORDER — VENLAFAXINE HCL ER 75 MG PO CP24
75.0000 mg | ORAL_CAPSULE | Freq: Every day | ORAL | Status: AC
  Administered 2021-10-23 – 2021-10-24 (×2): 75 mg via ORAL
  Filled 2021-10-22 (×2): qty 1

## 2021-10-22 MED ORDER — HYDROXYZINE HCL 25 MG PO TABS
25.0000 mg | ORAL_TABLET | Freq: Three times a day (TID) | ORAL | Status: DC | PRN
  Administered 2021-10-22 – 2021-10-23 (×4): 25 mg via ORAL
  Filled 2021-10-22 (×4): qty 1

## 2021-10-22 MED ORDER — FOLIC ACID 1 MG PO TABS
2.0000 mg | ORAL_TABLET | Freq: Every day | ORAL | Status: DC
Start: 2021-10-22 — End: 2021-10-24
  Administered 2021-10-22 – 2021-10-24 (×3): 2 mg via ORAL
  Filled 2021-10-22 (×6): qty 2

## 2021-10-22 MED ORDER — ALUM & MAG HYDROXIDE-SIMETH 200-200-20 MG/5ML PO SUSP: 30.0000 mL | ORAL | Status: DC | PRN

## 2021-10-22 MED ORDER — MAGNESIUM HYDROXIDE 400 MG/5ML PO SUSP: 30.0000 mL | Freq: Every day | ORAL | Status: DC | PRN

## 2021-10-22 NOTE — Tx Team (Signed)
Initial Treatment Plan ?10/22/2021 ?11:06 AM ?[name] ?[id number] ? ? ? ?PATIENT STRESSORS: ?Financial difficulties   ?Health problems   ?Marital or family conflict   ? ? ?PATIENT STRENGTHS: ?Ability for insight  ?Average or above average intelligence  ?Motivation for treatment/growth  ?Supportive family/friends  ? ? ?PATIENT IDENTIFIED PROBLEMS: ?"To increase my feelings of self-worth"  ?"To learn coping skills for stressful situations"  ?Suicidal ideation  ?Depression  ?Anxiety  ?  ?  ?  ?  ?  ? ?DISCHARGE CRITERIA:  ?Ability to meet basic life and health needs ?Adequate post-discharge living arrangements ?Motivation to continue treatment in a less acute level of care ? ?PRELIMINARY DISCHARGE PLAN: ?Attend aftercare/continuing care group ?Attend 12-step recovery group ?Outpatient therapy ?Return to previous living arrangement ? ?PATIENT/FAMILY INVOLVEMENT: ?This treatment plan has been presented to and reviewed with the patient, [name], and/or family member.  The patient and family have been given the opportunity to ask questions and make suggestions. ? ?Vela Prose, RN ?10/22/2021, 11:06 AM ?

## 2021-10-22 NOTE — Progress Notes (Incomplete)
Pt was accepted to [facility] 402-2 pt to arrive after 0900 on 10/22/2021 ? ?Pt meets inpatient criteria per [name], NP ? ?Attending Physician will be Dr. Caswell Corwin ? ?Report can be called to: -Adult unit: [phone] ? ?Pt can arrive after 0900 ? ?Care Team notified: Vonzella Nipple, RN, Lodema Pilot, Healther Reddick, RN, [facility] Wynonia Hazard, RN, Rico Sheehan, [facility], Burnett Corrente, [place], Therapist, sports. ? ?Nadara Mode, LCSWA ?10/22/2021 @ 1:06 AM ? ?

## 2021-10-22 NOTE — BH Assessment (Signed)
Wynonia Hazard, [facility] at [facility], states: "This patient is accepted to 402-2 by [name], NP. Dr Caswell Corwin attending. Pt to arrive after 0900." Number for RN report is [phone]. ?
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
## 2021-10-22 NOTE — ED Notes (Addendum)
[place] called stating they have a patient of ours that arrived and they need report. States I should have called [facility].Report given at this time to  [facility] nurse ?

## 2021-10-22 NOTE — [facility] Group Notes (Signed)
Wrap up group: coping with stress. ?

## 2021-10-22 NOTE — Progress Notes (Signed)
Patient admitted to 400 hall today.  Patient has been sleeping this morning.  Ate small lunch.  Went to afternoon group.  Patient has been cooperative.  ?Denied SI and HI, contracts for safety.  Denied A/V hallucinations.   ?

## 2021-10-22 NOTE — ED Notes (Addendum)
No return call  from [facility] so [facility] to give report. Per staff answering phone Nurse giving meds at this time , he has my number and they were short a nurse . Per staff member on phone Aware pt is en route ?

## 2021-10-22 NOTE — Progress Notes (Signed)
Admission Note: Patient is a 46 year old female admitted to the unit voluntarily from [place] [place] for symptoms of depression, anxiety and suicidal ideation with no plan. Patient is alert and oriented x 4.  Presents with a flat/sad affect and depressed mood.  Reports a "lot of things" as her stressors.  Reports severe anxiety with panic attack.  Stated she is here to work on her self-worth and to learn coping skills for stressful situations.  Stated she is separated from her husband who lives in [place].  Stated she is unemployed and on disability due to her Fibromyalgia and Rheumatoid Arthritis.  Patient currently lives with her parents who she identifies as her primary support.  Admission plan of care reviewed, consent signed for treatment.  Skin and personal belongings completed.   Skin is dry and intact.  No contraband found.  Patient oriented to the unit, room and staff.  Routine safety checks initiated.  Support and encouragement offered.  Verbalizes understanding of unit rules/protocols.  Patient is safe on the unit at this time. ?

## 2021-10-22 NOTE — ED Notes (Signed)
[name], [facility] at [facility], states: "This patient is accepted to 402-2 by [name], NP. Dr Caswell Corwin attending. Pt to arrive after 0900." Number for RN report is [phone]. ?
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
## 2021-10-22 NOTE — ED Notes (Addendum)
Transportation here for patient. Called to give report nurses per annette in [place] and nurse will me back call back in 15 mins. Per [facility] Charge nurse states may send pt as not to delay care. Voluntary consent signed by patient ?

## 2021-10-23 ENCOUNTER — Encounter (HOSPITAL_COMMUNITY): Payer: Self-pay

## 2021-10-23 LAB — BASIC METABOLIC PANEL
Anion gap: 6 (ref 5–15)
BUN: 10 mg/dL (ref 6–20)
CO2: 25 mmol/L (ref 22–32)
Calcium: 9.1 mg/dL (ref 8.9–10.3)
Chloride: 109 mmol/L (ref 98–111)
Creatinine, Ser: 0.9 mg/dL (ref 0.44–1.00)
GFR, Estimated: >60 mL/min (ref >60)
Glucose, Bld: 109 mg/dL — ABNORMAL HIGH (ref 70–99)
Potassium: 3.8 mmol/L (ref 3.5–5.1)
Sodium: 140 mmol/L (ref 135–145)

## 2021-10-23 LAB — VITAMIN D 25 HYDROXY (VIT D DEFICIENCY, FRACTURES): Vit D, 25-Hydroxy: 57.65 ng/mL (ref 30–100)

## 2021-10-23 LAB — TSH: TSH: 3.329 u[IU]/mL (ref 0.350–4.500)

## 2021-10-23 LAB — LIPID PANEL
Cholesterol: 154 mg/dL (ref 0–200)
HDL: 42 mg/dL (ref >40)
LDL Cholesterol: 87 mg/dL (ref 0–99)
Total CHOL/HDL Ratio: 3.7 RATIO
Triglycerides: 126 mg/dL (ref <150)
VLDL: 25 mg/dL (ref 0–40)

## 2021-10-23 LAB — HEMOGLOBIN A1C
Hgb A1c MFr Bld: 5.8 % — ABNORMAL HIGH (ref 4.8–5.6)
Mean Plasma Glucose: 119.76 mg/dL

## 2021-10-23 MED ORDER — TRIAMCINOLONE ACETONIDE 0.1 % EX CREA
TOPICAL_CREAM | Freq: Two times a day (BID) | CUTANEOUS | Status: DC
  Filled 2021-10-23: qty 15

## 2021-10-23 NOTE — BH IP Treatment Plan (Signed)
Interdisciplinary Treatment and Diagnostic Plan Update ? ?10/23/2021 ?Time of Session: 9:10am  ?[name] ?MRN: [id number] ? ?Principal Diagnosis: Major depressive disorder, recurrent episode, severe ([place]) ? ?Secondary Diagnoses: Principal Problem: ?  Major depressive disorder, recurrent episode, severe ([place]) ?Active Problems: ?  Fatigue ?  Rheumatoid arthritis ([place]) ?  Generalized anxiety disorder with panic attacks ?  Fibromyalgia ?  Chronic pain syndrome ?  PTSD (post-traumatic stress disorder) ? ? ?Current Medications:  ?Current Facility-Administered Medications  ?Medication Dose Route Frequency Provider Last Rate Last Admin  ? acetaminophen (TYLENOL) tablet 650 mg  650 mg Oral Q6H PRN [name] A, NP   650 mg at 10/23/21 0751  ? albuterol (VENTOLIN HFA) 108 (90 Base) MCG/ACT inhaler 2 puff  2 puff Inhalation Q6H PRN Massengill, Ovid Curd, MD      ? alum & mag hydroxide-simeth (MAALOX/MYLANTA) 200-200-20 MG/5ML suspension 30 mL  30 mL Oral Q4H PRN Rozetta Nunnery, NP      ? Derrill Memo ON 10/24/2021] DULoxetine (CYMBALTA) DR capsule 30 mg  30 mg Oral Daily Massengill, Nathan, MD      ? EPINEPHrine (EPI-PEN) injection 0.3 mg  0.3 mg Intramuscular Once PRN Rozetta Nunnery, NP      ? folic acid (FOLVITE) tablet 2 mg  2 mg Oral Daily Massengill, Ovid Curd, MD   2 mg at 10/23/21 0749  ? gabapentin (NEURONTIN) capsule 400 mg  400 mg Oral TID Janine Limbo, MD   400 mg at 10/23/21 1204  ? hydrOXYzine (ATARAX) tablet 25 mg  25 mg Oral TID PRN [name] A, NP   25 mg at 10/23/21 0751  ? magnesium hydroxide (MILK OF MAGNESIA) suspension 30 mL  30 mL Oral Daily PRN Rozetta Nunnery, NP      ? Derrill Memo ON 10/27/2021] methotrexate (RHEUMATREX) tablet 15 mg  15 mg Oral Weekly Massengill, Nathan, MD      ? pantoprazole (PROTONIX) EC tablet 40 mg  40 mg Oral Daily Massengill, Ovid Curd, MD   40 mg at 10/23/21 0749  ? topiramate (TOPAMAX) tablet 50 mg  50 mg Oral QHS Massengill, Ovid Curd, MD   50 mg at 10/22/21 2119  ? traZODone  (DESYREL) tablet 50 mg  50 mg Oral QHS PRN Janine Limbo, MD   50 mg at 10/22/21 2119  ? triamcinolone cream (KENALOG) 0.1 % cream   Topical BID Janine Limbo, MD   Given at 10/23/21 [phone]  ? venlafaxine XR (EFFEXOR-XR) 24 hr capsule 75 mg  75 mg Oral Q breakfast Massengill, Ovid Curd, MD   75 mg at 10/23/21 0749  ? Followed by  ? [START ON 10/25/2021] venlafaxine XR (EFFEXOR-XR) 24 hr capsule 37.5 mg  37.5 mg Oral Q breakfast Massengill, Ovid Curd, MD      ? ?PTA Medications: ?Facility-Administered Medications Prior to Admission  ?Medication Dose Route Frequency Provider Last Rate Last Admin  ? [DISCONTINUED] methylPREDNISolone sodium succinate (SOLU-MEDROL) 125 mg/2 mL injection 125 mg  125 mg Intramuscular Once PRN Causey, Charlestine Massed, NP      ? ?Medications Prior to Admission  ?Medication Sig Dispense Refill Last Dose  ? Varenicline Tartrate (TYRVAYA) 0.03 MG/ACT SOLN Place 1 spray into both nostrils 2 (two) times daily. For dry eye disease     ? albuterol (VENTOLIN HFA) 108 (90 Base) MCG/ACT inhaler Inhale into the lungs every 6 (six) hours as needed for wheezing or shortness of breath.     ? betamethasone valerate ointment (VALISONE) 0.1 % Place a pea sized amount topically BID  for up to 1 week as needed. Not for daily long term use. 30 g 1   ? EPINEPHrine 0.3 mg/0.3 mL IJ SOAJ injection Inject 0.3 mg into the muscle as needed for anaphylaxis. 1 each 1   ? folic acid (FOLVITE) 1 MG tablet 2 mg daily.   10/20/2021  ? gabapentin (NEURONTIN) 100 MG capsule Take 2 capsules by mouth daily. (Patient not taking: Reported on 10/22/2021)   Not Taking  ? methotrexate 2.5 MG tablet Take 6 tablets by mouth once a week. Every Sunday   10/20/2021  ? omeprazole (PRILOSEC) 40 MG capsule Take 1 capsule (40 mg total) by mouth 2 (two) times daily. Take in the morning and at dinnertime. 60 capsule 11 10/20/2021  ? topiramate (TOPAMAX) 50 MG tablet Take 1 tablet (50 mg total) by mouth at bedtime. 30 tablet 5 10/20/2021  ?  venlafaxine XR (EFFEXOR-XR) 150 MG 24 hr capsule Take one capsule by mouth once daily with Breakfast 90 capsule 1 10/20/2021  ? Vitamin D, Ergocalciferol, (DRISDOL) 1.25 MG (50000 UNIT) CAPS capsule Take 1 capsule (50,000 Units total) by mouth every 7 (seven) days. 5 capsule 2 10/13/2021  ? ? ?Patient Stressors: Financial difficulties   ?Health problems   ?Marital or family conflict   ? ?Patient Strengths: Ability for insight  ?Average or above average intelligence  ?Motivation for treatment/growth  ?Supportive family/friends  ? ?Treatment Modalities: Medication Management, Group therapy, Case management,  ?1 to 1 session with clinician, Psychoeducation, Recreational therapy. ? ? ?Physician Treatment Plan for Primary Diagnosis: Major depressive disorder, recurrent episode, severe ([place]) ?Long Term Goal(s): Improvement in symptoms so as ready for discharge  ? ?Short Term Goals: Ability to identify changes in lifestyle to reduce recurrence of condition will improve ?Ability to verbalize feelings will improve ?Ability to disclose and discuss suicidal ideas ?Ability to demonstrate self-control will improve ?Ability to identify and develop effective coping behaviors will improve ?Ability to maintain clinical measurements within normal limits will improve ?Compliance with prescribed medications will improve ?Ability to identify triggers associated with substance abuse/mental health issues will improve ? ?Medication Management: Evaluate patient's response, side effects, and tolerance of medication regimen. ? ?Therapeutic Interventions: 1 to 1 sessions, Unit Group sessions and Medication administration. ? ?Evaluation of Outcomes: Not Met ? ?Physician Treatment Plan for Secondary Diagnosis: Principal Problem: ?  Major depressive disorder, recurrent episode, severe ([place]) ?Active Problems: ?  Fatigue ?  Rheumatoid arthritis ([place]) ?  Generalized anxiety disorder with panic attacks ?  Fibromyalgia ?  Chronic pain syndrome ?  PTSD  (post-traumatic stress disorder) ? ?Long Term Goal(s): Improvement in symptoms so as ready for discharge  ? ?Short Term Goals: Ability to identify changes in lifestyle to reduce recurrence of condition will improve ?Ability to verbalize feelings will improve ?Ability to disclose and discuss suicidal ideas ?Ability to demonstrate self-control will improve ?Ability to identify and develop effective coping behaviors will improve ?Ability to maintain clinical measurements within normal limits will improve ?Compliance with prescribed medications will improve ?Ability to identify triggers associated with substance abuse/mental health issues will improve    ? ?Medication Management: Evaluate patient's response, side effects, and tolerance of medication regimen. ? ?Therapeutic Interventions: 1 to 1 sessions, Unit Group sessions and Medication administration. ? ?Evaluation of Outcomes: Not Met ? ? ?RN Treatment Plan for Primary Diagnosis: Major depressive disorder, recurrent episode, severe ([place]) ?Long Term Goal(s): Knowledge of disease and therapeutic regimen to maintain health will improve ? ?Short Term Goals: Ability to remain free  from injury will improve, Ability to participate in decision making will improve, Ability to verbalize feelings will improve, Ability to disclose and discuss suicidal ideas, and Ability to identify and develop effective coping behaviors will improve ? ?Medication Management: RN will administer medications as ordered by provider, will assess and evaluate patient's response and provide education to patient for prescribed medication. RN will report any adverse and/or side effects to prescribing provider. ? ?Therapeutic Interventions: 1 on 1 counseling sessions, Psychoeducation, Medication administration, Evaluate responses to treatment, Monitor vital signs and CBGs as ordered, Perform/monitor CIWA, COWS, AIMS and Fall Risk screenings as ordered, Perform wound care treatments as  ordered. ? ?Evaluation of Outcomes: Not Met ? ? ?LCSW Treatment Plan for Primary Diagnosis: Major depressive disorder, recurrent episode, severe ([place]) ?Long Term Goal(s): Safe transition to appropriate next level of care at disch
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
## 2021-10-23 NOTE — Group Note (Signed)
Recreation Therapy Group Note ? ? ?Group Topic:Stress Management  ?Group Date: 10/23/2021 ?Start Time: 0930 ?End Time: 1000 ?Facilitators: Victorino Sparrow, LRT,CTRS ?Location: [place] ? ? ?Goal Area(s) Addresses:  ?Patient will actively participate in stress management techniques presented during session.  ?Patient will successfully identify benefit of practicing stress management post d/c.  ? ?Group Description:  Progressive Muscle Relaxation. LRT provided education, instruction and demonstration on practice of Progressive Muscle Relaxation. Patient was asked to participate in technique introduced during session. Patient was to follow along as LRT went through each muscle group to tense and relax them releasing any stress. ? ? ?Affect/Mood: N/A ?  ?Participation Level: Did not occur ?  ? ?Clinical Observations/Individualized Feedback: Group session did not occur due to orientation group ran over into group time.  ? ? ?Plan: Continue to engage patient in RT group sessions 2-3x/week. ? ? ?Victorino Sparrow, LRT,CTRS ?10/23/2021 1:19 PM ?
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
## 2021-10-23 NOTE — Group Note (Signed)
LCSW Group Therapy Note ? ? ?Group Date: 10/23/2021 ?Start Time: 1300 ?End Time: 1400 ? ?Type of Therapy and Topic:  Group Therapy:  Self-Esteem ?  ?Participation Level:  Active ? ?Description of Group: ?This group addressed positive self-esteem. Patients were given a worksheet with a blank shield. Patients were asked what a shield is and when it is used. Patients were asked to list, draw, or write protective factors in the their lives on their shields. Patients discussed the words, ideas and drawings that they put on their shield. Patients were encouraged to have a daily reflection of positive characteristics/ protective factors. ? ?Therapeutic Goals ?Patient will verbalize two of their positive qualities ?Patient will demonstrate insight but naming social supports in their lives ?Patient will verbalize their feelings when voicing positive self affirmations and when voicing positive affirmations of others ?Patients will discuss the potential positive impact on their wellness/recovery of focusing on positive traits of self and others. ? ?Summary of Patient Progress: The Pt attended group and remained there the entire time.  The Pt accepted the handouts that were provided and followed along with the worksheet throughout the group session.  The Pt demonstrated knowledge of the topic being discussed and was appropriate with their peers.  Pt reports that the birth of her grandson and having a supportive family is a protective factor for her.  ? ?Darleen Crocker, LCSWA ?10/23/2021  3:15 PM   ? ?

## 2021-10-23 NOTE — Progress Notes (Signed)
Pt denies SI/HI/AVH and verbally agrees to approach staff if these become apparent or before harming themselves/others. Rates depression 5/10. Rates anxiety 4/10. Rates pain 8/10 in legs. Pt rescinded 72 hour form at 1045. Pt stated that feels a lot better than she did yesterday and that she has not felt this good in a while. Pt stated that the program here is working well for ger. She asked if she ever got down to where she was again, if she could come back her. Pt was reassured. Pt has gone to groups and interacting well with others. Scheduled medications administered to pt, per MD orders. RN provided support and encouragement to pt. Q15 min safety checks implemented and continued. Pt safe on the unit. RN will continue to monitor and intervene as needed.  ? 10/23/21 0751  ?Psych Admission Type (Psych Patients Only)  ?Admission Status Voluntary  ?Psychosocial Assessment  ?Patient Complaints Anxiety;Depression  ?Eye Contact Fair  ?Facial Expression Other (Comment) ?(WDL)  ?Affect Appropriate to circumstance  ?Speech Logical/coherent  ?Interaction Assertive  ?Motor Activity Other (Comment) ?(WDL)  ?Appearance/Hygiene Unremarkable  ?Behavior Characteristics Cooperative;Appropriate to situation;Calm  ?Mood Sad;Pleasant  ?Thought Process  ?Coherency WDL  ?Content WDL  ?Delusions None reported or observed  ?Perception WDL  ?Hallucination None reported or observed  ?Judgment WDL  ?Confusion None  ?Danger to Self  ?Current suicidal ideation? Denies  ?Danger to Others  ?Danger to Others None reported or observed  ? ? ?

## 2021-10-23 NOTE — Progress Notes (Signed)
?   10/22/21 2200  ?Psych Admission Type (Psych Patients Only)  ?Admission Status Voluntary  ?Psychosocial Assessment  ?Patient Complaints Anxiety;Depression  ?Eye Contact Fair  ?Facial Expression Sad  ?Affect Sad  ?Speech Logical/coherent  ?Interaction Assertive  ?Motor Activity Slow  ?Appearance/Hygiene Improved  ?Behavior Characteristics Appropriate to situation;Cooperative  ?Mood Depressed  ?Thought Process  ?Coherency WDL  ?Content WDL  ?Delusions None reported or observed  ?Perception WDL  ?Hallucination None reported or observed  ?Judgment WDL  ?Confusion None  ?Danger to Self  ?Current suicidal ideation? Denies  ?Self-Injurious Behavior No self-injurious ideation or behavior indicators observed or expressed   ?Agreement Not to Harm Self Yes  ?Description of Agreement verbal  ?Danger to Others  ?Danger to Others None reported or observed  ? ? ?

## 2021-10-23 NOTE — Progress Notes (Signed)
The patient attended the N.A.meeting and was appropriate.  ?

## 2021-10-24 DIAGNOSIS — F332 Major depressive disorder, recurrent severe without psychotic features: Principal | ICD-10-CM

## 2021-10-24 MED ORDER — GABAPENTIN 400 MG PO CAPS: 400.0000 mg | ORAL_CAPSULE | Freq: Three times a day (TID) | ORAL | 0 refills | Status: DC

## 2021-10-24 MED ORDER — VENLAFAXINE HCL ER 37.5 MG PO CP24: 37.5000 mg | ORAL_CAPSULE | Freq: Every day | ORAL | 0 refills | Status: DC

## 2021-10-24 MED ORDER — DULOXETINE HCL 30 MG PO CPEP
30.0000 mg | ORAL_CAPSULE | Freq: Every day | ORAL | 0 refills | Status: DC
Start: 2021-10-25 — End: 2021-12-12

## 2021-10-24 MED ORDER — TRAZODONE HCL 50 MG PO TABS
50.0000 mg | ORAL_TABLET | Freq: Every evening | ORAL | 0 refills | Status: DC | PRN
Start: 2021-10-24 — End: 2021-12-12

## 2021-10-24 MED ORDER — HYDROXYZINE HCL 25 MG PO TABS: 25.0000 mg | ORAL_TABLET | Freq: Three times a day (TID) | ORAL | 0 refills | Status: AC | PRN

## 2021-10-24 MED ORDER — TRIAMCINOLONE ACETONIDE 0.1 % EX CREA
TOPICAL_CREAM | Freq: Two times a day (BID) | CUTANEOUS | 0 refills | Status: AC
Start: 2021-10-24 — End: ?

## 2021-10-24 NOTE — Progress Notes (Signed)
?  [facility] Adult Case Management Discharge Plan : ? ?Will you be returning to the same living situation after discharge:  Yes,  Home  ?At discharge, do you have transportation home?: Yes,  Mother  ?Do you have the ability to pay for your medications: Yes,  Medicare  ? ?Release of information consent forms completed and in the chart;  Patient's signature needed at discharge. ? ?Patient to Follow up at: ? Follow-up Information   ? ? [facility], [place]. Go on 11/12/2021.   ?Why: You have an appointment for therapy services on 11/12/21 at 11:00 am.  This appointment will be held in person. ?Contact information: ?[place], [place] ?[place] [address] ?[phone] ? ? ?  ?  ? ? [facility] Follow up on 12/16/2021.   ?Specialty: Behavioral Health ?Why: Please go to this provider for interim medication management services, on walk in days:  Monday and Wednesdays (arrive at 7:30 am as services are first come, first served).  * You have an appointment with Dr. Candie Chroman for medication management services on 12/16/21 at 3:00 pm.  This appointment will be held in person. ?Contact information: ?[address] ?[place] [address] ?[phone] ? ?  ?  ? ?  ?  ? ?  ? ? ?Next level of care provider has access to [place] ? ?Safety Planning and Suicide Prevention discussed: Yes,  with patient and mother  ? ?  ? ?Has patient been referred to the Quitline?: N/A patient is not a smoker ? ?Patient has been referred for addiction treatment: Pt. refused referral ? ?Darleen Crocker, LCSWA ?10/24/2021, 10:31 AM ?
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
## 2021-10-24 NOTE — [facility] Suicide Risk Assessment (Signed)
[facility] INPATIENT:  Family/Significant Other Suicide Prevention Education ? ?Suicide Prevention Education:  ?Education Completed; [name] [phone] (Mother) has been identified by the patient as the family member/significant other with whom the patient will be residing, and identified as the person(s) who will aid the patient in the event of a mental health crisis (suicidal ideations/suicide attempt).  With written consent from the patient, the family member/significant other has been provided the following suicide prevention education, prior to the and/or following the discharge of the patient. ? ?The suicide prevention education provided includes the following: ?Suicide risk factors ?Suicide prevention and interventions ?National Suicide Hotline telephone number ?[facility] assessment telephone number ?[facility] Emergency Assistance 911 ?[place] and/or Residential Mobile Crisis Unit telephone number ? ?Request made of family/significant other to: ?Remove weapons (e.g., guns, rifles, knives), all items previously/currently identified as safety concern.   ?Remove drugs/medications (over-the-counter, prescriptions, illicit drugs), all items previously/currently identified as a safety concern. ? ?The family member/significant other verbalizes understanding of the suicide prevention education information provided.  The family member/significant other agrees to remove the items of safety concern listed above. ? ?CSW spoke with Mrs. [name] who states that her daughter has been struggling with physical health concerns, anxiety, and her grandmother passing away recently.  She states that her daughter has spoken with her about her thoughts and feelings and she believes that coming to the hospital has been a good experience for her.  Mrs. [name] confirms that her daughter can return home and will be living with them.  She states that there are no firearms or weapons in the home.  CSW  completed SPE with [name].  ? ?[name] ?10/24/2021, 10:38 AM ?

## 2021-10-24 NOTE — Progress Notes (Signed)
D Patient compliant with treatment plan, Denies SI/HI/A/VH. ? ?A Scheduled medications administered per Provider order. Support and encouragement provided. Routine safety checks conducted every 15 minutes. Patient notified to inform staff with problems or concerns. ? ?R. No adverse drug reactions noted. Patient contracts for safety at this time. Will continue to monitor Patient.   ?

## 2021-10-24 NOTE — [facility] Group Notes (Signed)
The focus of this group is to help patients establish daily goals to achieve during treatment and discuss how the patient can incorporate goal setting into their daily lives to aide in recovery. 

## 2021-10-24 NOTE — Discharge Instructions (Signed)
-  Follow-up with your outpatient psychiatric provider -instructions on appointment date, time, and address (location) are provided to you in discharge paperwork. ?  ?-Take your psychiatric medications as prescribed at discharge - instructions are provided to you in the discharge paperwork. You have 2 more doses of effexor 37.5 mg once daily (4-21 and 4-22), then stop.  ?  ?-Follow-up with outpatient primary care doctor and other specialists -for management of chronic medical disease. ?  ?-Testing: Follow-up with outpatient provider for abnormal lab results:  ?Hba1c 5.8 ?  ?-Recommend abstinence from alcohol, tobacco, and other illicit drug use at discharge.  ?  ?-If your psychiatric symptoms recur, worsen, or if you have side effects to your psychiatric medications, call your outpatient psychiatric provider, 911, [phone] or go to the nearest emergency department. ?  ?-If suicidal thoughts recur, call your outpatient psychiatric provider, 911, [phone] or go to the nearest emergency department. ?  ?

## 2021-10-24 NOTE — [facility] Suicide Risk Assessment (Signed)
[facility] Discharge Suicide Risk Assessment ? ? ?Principal Problem: Major depressive disorder, recurrent episode, severe ([place]) ?Discharge Diagnoses: Principal Problem: ?  Major depressive disorder, recurrent episode, severe ([place]) ?Active Problems: ?  Fatigue ?  Rheumatoid arthritis ([place]) ?  Generalized anxiety disorder with panic attacks ?  Fibromyalgia ?  Chronic pain syndrome ?  PTSD (post-traumatic stress disorder) ? ? ?Total Time spent with patient: 20 minutes ?Patient is a 46 year old female with a reported psychiatric history of "depression and anxiety" who was admitted to the psychiatric hospital for evaluation of worsening depression and suicidal thoughts. ? ?During the patient's hospitalization, patient had extensive initial psychiatric evaluation, and follow-up psychiatric evaluations every day. ? ?Psychiatric diagnoses provided upon initial assessment:  ?Major depressive disorder, recurrent, severe, without psychotic features ?GAD with panic attacks and agoraphobia ?PTSD ? ?Patient's psychiatric medications were adjusted on admission:  ?-Taper off venlafaxine. Taper regimen was followed: Effexor 112.5 mg once daily for 1 dose, then decrease to 75 mg once daily for 2 doses, then decrease to 37.5 mg once daily for 2 doses, then stop.   ?-Start Cymbalta 20 mg once daily for 2 doses, then increase to 30 mg once daily-for MDD, GAD, PTSD, pain ?-Increase gabapentin to 400 mg 3 times daily for pain, anxiety, and mood stabilization ?-Start trazodone 50 mg nightly as needed ? ?During the hospitalization, other adjustments were made to the patient's psychiatric medication regimen:  ?-Effexor was tapered and decreased to 75 mg once daily. Pt will have 2 doses of 37.5 mg once daily (4-21 and 4-22), then STOP ?-cymbalta was started and increased to 30 mg once daily  ? ? ?Gradually, patient started adjusting to milieu.   ?Patient's care was discussed during the interdisciplinary team meeting every day during the  hospitalization. ? ?The patient denied having side effects to prescribed psychiatric medication. ? ?The patient reports their target psychiatric symptoms of depression and suicidal thoughts, all responded well to the psychiatric medications, and the patient reports overall benefit other psychiatric hospitalization. Supportive psychotherapy was provided to the patient. The patient also participated in regular group therapy while admitted.  ? ?Labs were reviewed with the patient, and abnormal results were discussed with the patient. ? ?The patient denied having suicidal thoughts more than 48 hours prior to discharge.  Patient denies having homicidal thoughts.  Patient denies having auditory hallucinations.  Patient denies any visual hallucinations.  Patient denies having paranoid thoughts. ? ?The patient is able to verbalize their individual safety plan to this provider. ? ?It is recommended to the patient to continue psychiatric medications as prescribed, after discharge from the hospital.   ? ?It is recommended to the patient to follow up with your outpatient psychiatric provider and PCP. ? ?Discussed with the patient, the impact of alcohol, drugs, tobacco have been there overall psychiatric and medical wellbeing, and total abstinence from substance use was recommended the patient. ? ? ? ? ? ?Musculoskeletal: ?Strength & Muscle Tone: within normal limits ?Gait & Station: normal ?Patient leans: N/A ? ?Psychiatric Specialty Exam ? ?Presentation  ?General Appearance: Casual; Fairly Groomed ? ?Eye Contact:Good ? ?Speech:Clear and Coherent; Normal Rate ? ?Speech Volume:Normal ? ?Handedness:No data recorded ? ?Mood and Affect  ?Mood:Euthymic ? ?Duration of Depression Symptoms: Greater than two weeks ? ?Affect:Appropriate; Congruent; Full Range ? ? ?Thought Process  ?Thought Processes:Linear ? ?Descriptions of Associations:Intact ? ?Orientation:Full (Time, Place and Person) ? ?Thought Content:Logical ? ?History of  Schizophrenia/Schizoaffective disorder:No ? ?Duration of Psychotic Symptoms:No data recorded ?Hallucinations:Hallucinations: None ? ?Ideas  of Reference:None ? ?Suicidal Thoughts:Suicidal Thoughts: No ? ?Homicidal Thoughts:Homicidal Thoughts: No ? ? ?Sensorium  ?Memory:Immediate Good; Recent Good; Remote Good ? ?Judgment:Good ? ?Insight:Good ? ? ?Executive Functions  ?Concentration:Good ? ?Attention Span:Good ? ?Recall:Good ? ?Fund of [place] ? ?Language:Good ? ? ?Psychomotor Activity  ?Psychomotor Activity:Psychomotor Activity: Normal ? ? ?Assets  ?Assets:No data recorded ? ?Sleep  ?Sleep:Sleep: Good ? ? ?Physical Exam: ?Physical Exam See discharge summary ? ?ROS See discharge summary ? ? ?Blood pressure 100/79, pulse 90, temperature (!) 97.3 ?F (36.3 ?C), resp. rate 16, height '5\' 4"'$  (1.626 m), weight 95.3 kg, SpO2 100 %. Body mass index is 36.05 kg/m?. ? ?Mental Status Per Nursing Assessment::   ?On Admission:  Self-harm thoughts ? ?Demographic factors:  Unemployed ?Loss Factors:  Decline in physical health, Decrease in vocational status, Financial problems / change in socioeconomic status ?Historical Factors:  NA ?Risk Reduction Factors:  Living with another person, especially a relative ? ?Continued Clinical Symptoms:  ?MDD - mood is stable. Denies SI. Denies HI.  ? ?Cognitive Features That Contribute To Risk:  ?None   ? ?Suicide Risk:  ?Mild:  There are no identifiable suicide plans, no associated intent, mild dysphoria and related symptoms, good self-control (both objective and subjective assessment), few other risk factors, and identifiable protective factors, including available and accessible social support. ? ? Follow-up Information   ? ? [facility] Go on 11/12/2021.   ?Why: You have an appointment for therapy services on 11/12/21 at 11:00 am.  This appointment will be held in person. ?Contact information: ?[place], [place] ?[place]  [address] ?[phone] ? ? ?  ?  ? ? [facility] Follow up on 12/16/2021.   ?Specialty: Behavioral Health ?Why: Please go to this provider for interim medication management services, on walk in days:  Monday and Wednesdays (arrive at 7:30 am as services are first come, first served).  * You have an appointment with Dr. Candie Chroman for medication management services on 12/16/21 at 3:00 pm.  This appointment will be held in person. ?Contact information: ?[address] ?[place] [address] ?[phone] ? ?  ?  ? ?  ?  ? ?  ? ? ?Plan Of Care/Follow-up recommendations:  ? ?Activity: as tolerated ? ?Diet: heart healthy ? ?Other: ?-Follow-up with your outpatient psychiatric provider -instructions on appointment date, time, and address (location) are provided to you in discharge paperwork. ? ?-Take your psychiatric medications as prescribed at discharge - instructions are provided to you in the discharge paperwork. You have 2 more doses of effexor 37.5 mg once daily (4-21 and 4-22), then stop.  ? ?-Follow-up with outpatient primary care doctor and other specialists -for management of chronic medical disease. ? ?-Testing: Follow-up with outpatient provider for abnormal lab results:  ?Hba1c 5.8 ? ?-Recommend abstinence from alcohol, tobacco, and other illicit drug use at discharge.  ? ?-If your psychiatric symptoms recur, worsen, or if you have side effects to your psychiatric medications, call your outpatient psychiatric provider, 911, [phone] or go to the nearest emergency department. ? ?-If suicidal thoughts recur, call your outpatient psychiatric provider, 911, [phone] or go to the nearest emergency department. ? ? ?Christoper Allegra, MD ?10/24/2021, 10:32 AM ? ? ?

## 2021-10-24 NOTE — Progress Notes (Signed)
[name]  D/C'd Home per MD order.  Discussed with the patient and all questions fully answered. ? ?VSS, Skin clean, dry and intact without evidence of skin break down, no evidence of skin tears noted. ? ?An After Visit Summary was printed and given to the patient. Patient received prescription information to pick her medication at [facility]. ? ?D/c education completed with patient including follow up instructions, medication list, d/c activities limitations if indicated, with other d/c instructions as indicated by MD - patient able to verbalize understanding, all questions fully answered. Patient denies SI/HI/AVH, was very appreciative of the care she received at the adult [facility]. Patient received all her belonging in locker 58. ? ?Patient escorted to main entrance, and D/C home via private auto 1405. ? ?[name] [name] ?10/24/2021 2:13 PM  ?

## 2021-10-25 ENCOUNTER — Telehealth: Payer: Self-pay | Admitting: *Deleted

## 2021-10-25 NOTE — Telephone Encounter (Signed)
Transition Care Management Unsuccessful Follow-up Telephone Call ? ?Date of discharge and from where:  10/24/2021 [place] ? ?Attempts:  1st Attempt ? ?Reason for unsuccessful TCM follow-up call:  Left voice message to return call.  ? ?  ?

## 2021-10-30 ENCOUNTER — Telehealth: Payer: Self-pay | Admitting: Adult Health

## 2021-10-30 NOTE — Telephone Encounter (Signed)
Per 4/25 in basket called and spoke to pt about moving appointment.  Pt confirmed appointment  ?

## 2021-11-05 ENCOUNTER — Ambulatory Visit: Payer: Medicare Other

## 2021-11-05 DIAGNOSIS — M722 Plantar fascial fibromatosis: Secondary | ICD-10-CM

## 2021-11-05 NOTE — Progress Notes (Signed)

## 2021-11-07 ENCOUNTER — Telehealth: Payer: Self-pay | Admitting: *Deleted

## 2021-11-07 NOTE — Telephone Encounter (Signed)
Patient called requesting refill on  betamethasone valerate ointment (VALISONE) 0.1 % prescribed on 06/2021 office visit. Patient reports she is only having irritation, no itching, no odor, or discharge. Patient currently in [place] will return next week. She believes it related to her "thong panties" that is causing the irritation. Please advise  ?

## 2021-11-07 NOTE — Telephone Encounter (Signed)
Please send in one refill for the Valisone, if she isn't better next week she should be seen.  ?

## 2021-11-08 MED ORDER — BETAMETHASONE VALERATE 0.1 % EX OINT: TOPICAL_OINTMENT | CUTANEOUS | 0 refills | Status: DC

## 2021-11-08 NOTE — Telephone Encounter (Signed)
Left detailed message on patient voicemail Rx sent and if not better next week to schedule an office visit.  ?

## 2021-11-19 ENCOUNTER — Inpatient Hospital Stay: Payer: Medicare Other | Attending: Adult Health | Admitting: Adult Health

## 2021-11-19 ENCOUNTER — Other Ambulatory Visit: Payer: Self-pay

## 2021-11-19 ENCOUNTER — Telehealth: Payer: Self-pay

## 2021-11-19 VITALS — BP 134/74 | HR 81 | Temp 97.6°F | Resp 18 | Ht 64.0 in | Wt 210.3 lb

## 2021-11-19 DIAGNOSIS — Z170 Estrogen receptor positive status [ER+]: Secondary | ICD-10-CM | POA: Insufficient documentation

## 2021-11-19 DIAGNOSIS — Z9221 Personal history of antineoplastic chemotherapy: Secondary | ICD-10-CM | POA: Insufficient documentation

## 2021-11-19 DIAGNOSIS — Z923 Personal history of irradiation: Secondary | ICD-10-CM | POA: Diagnosis not present

## 2021-11-19 DIAGNOSIS — Z79899 Other long term (current) drug therapy: Secondary | ICD-10-CM | POA: Diagnosis not present

## 2021-11-19 DIAGNOSIS — C50412 Malignant neoplasm of upper-outer quadrant of left female breast: Secondary | ICD-10-CM | POA: Diagnosis present
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
## 2021-11-19 NOTE — Telephone Encounter (Signed)
Called pt, discussed with her the paperwork processing to assist in her scheduling counseling.  Pt verbalized understanding and will reach back out once she gains more information so that we can help locate an appropriate place for these services  ?

## 2021-11-20 ENCOUNTER — Encounter: Payer: Self-pay | Admitting: Neurology

## 2021-11-20 ENCOUNTER — Ambulatory Visit: Payer: Medicare Other | Admitting: Neurology

## 2021-11-20 ENCOUNTER — Ambulatory Visit (INDEPENDENT_AMBULATORY_CARE_PROVIDER_SITE_OTHER): Payer: Medicare Other | Admitting: Neurology

## 2021-11-20 VITALS — BP 118/80 | HR 86 | Ht 64.0 in | Wt 208.2 lb

## 2021-11-20 DIAGNOSIS — G43009 Migraine without aura, not intractable, without status migrainosus: Secondary | ICD-10-CM | POA: Diagnosis not present

## 2021-11-20 MED ORDER — TOPIRAMATE 50 MG PO TABS: 50.0000 mg | ORAL_TABLET | Freq: Every day | ORAL | 5 refills | Status: DC

## 2021-11-20 MED ORDER — RIZATRIPTAN BENZOATE 10 MG PO TABS: 10.0000 mg | ORAL_TABLET | ORAL | 5 refills | Status: DC | PRN

## 2021-11-20 NOTE — Patient Instructions (Signed)
Continue topiramate '50mg'$  at bedtime ?Rizatriptan as needed.  Limit use of pain relievers to no more than 2 days out of week to prevent risk of rebound or medication-overuse headache. ?Follow up 4-5 months. ?
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
## 2021-11-21 ENCOUNTER — Encounter: Payer: Medicare Other | Admitting: Adult Health

## 2021-11-21 ENCOUNTER — Encounter: Payer: Self-pay | Admitting: Adult Health

## 2021-12-13 ENCOUNTER — Encounter
Payer: Medicare Other | Attending: Physical Medicine and Rehabilitation | Admitting: Physical Medicine and Rehabilitation

## 2021-12-13 ENCOUNTER — Encounter: Payer: Self-pay | Admitting: Physical Medicine and Rehabilitation

## 2021-12-13 VITALS — BP 122/84 | HR 88 | Ht 64.0 in | Wt 210.2 lb

## 2021-12-13 DIAGNOSIS — M797 Fibromyalgia: Secondary | ICD-10-CM | POA: Diagnosis present

## 2021-12-13 DIAGNOSIS — G894 Chronic pain syndrome: Secondary | ICD-10-CM | POA: Diagnosis present

## 2021-12-13 DIAGNOSIS — F332 Major depressive disorder, recurrent severe without psychotic features: Secondary | ICD-10-CM | POA: Diagnosis present

## 2021-12-13 DIAGNOSIS — M05731 Rheumatoid arthritis with rheumatoid factor of right wrist without organ or systems involvement: Secondary | ICD-10-CM

## 2021-12-13 DIAGNOSIS — M05732 Rheumatoid arthritis with rheumatoid factor of left wrist without organ or systems involvement: Secondary | ICD-10-CM | POA: Diagnosis present

## 2021-12-13 NOTE — Patient Instructions (Signed)
Patient is 46 yr old R handed female with hx of RA (dx'd 2019), breast CA- 2017 dx'd, and Fibromyalgia- secondary to breast cancer TREATMENT-- Sx's since 2019; and here for f/u of L>>>R Thoracic back pain. Also has Psoriatic arthritis as well just dx'd.   Discussed options- doesn't want to give up THC- cannot prescribe opiates but happy to continue to wokr on her pain.    2. Discussed legal alternatives- CBD oils or hemp derivatives. Suggest oils on tongue or gummies. Not inhaled substances-   3. Low dose naltrexone  3 mg x 1 week, then 6 mg nightly-    4. Suggest changing topamax to something else for migraines because can cause severe depression. - [facility]- [phone]- [place] went over possible side effects- sedation/anxiety.   5.  I'd like them to go to 60 mg daily on Duloxetine- helps Fibromyalgia and nerve pain help.  Off Lyrica- can cause depression.   6. FU in 3 months- call me in 1-4 weeks ot let me know how things going.
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
## 2021-12-16 ENCOUNTER — Ambulatory Visit (INDEPENDENT_AMBULATORY_CARE_PROVIDER_SITE_OTHER): Payer: Medicare Other | Admitting: Student in an Organized Health Care Education/Training Program

## 2021-12-16 ENCOUNTER — Encounter (HOSPITAL_COMMUNITY): Payer: Self-pay | Admitting: Student in an Organized Health Care Education/Training Program

## 2021-12-16 VITALS — BP 115/83 | HR 88 | Ht 64.0 in | Wt 204.0 lb

## 2021-12-16 DIAGNOSIS — F331 Major depressive disorder, recurrent, moderate: Secondary | ICD-10-CM | POA: Diagnosis not present

## 2021-12-16 MED ORDER — DULOXETINE HCL 60 MG PO CPEP: 60.0000 mg | ORAL_CAPSULE | Freq: Every day | ORAL | 2 refills | Status: DC

## 2021-12-16 MED ORDER — HYDROXYZINE HCL 25 MG PO TABS: 25.0000 mg | ORAL_TABLET | Freq: Three times a day (TID) | ORAL | 2 refills | Status: DC | PRN

## 2021-12-16 MED ORDER — TRAZODONE HCL 50 MG PO TABS: 50.0000 mg | ORAL_TABLET | Freq: Every day | ORAL | 2 refills | Status: DC

## 2021-12-16 NOTE — Patient Instructions (Signed)
Please contact one of the following facilities to start medication management and therapy services:   [facility] [place] ([place])  [place], [place]  [address] Phone: [phone]  [facility]  [address], [place] [address] Phone: [phone]  [facility]  [address]  [place], [place] [address]  [facility]  [address], [place] [address] Phone: [phone]   Please contact one of the following facilities to start therapy services:   [facility] [place] [id number]  [place], [place] [address] [phone]   [place]  [address] [place], [place] [address] [phone]  [place]  [address], [place] [address] [phone]  [facility]  [address] [place]  [place], [place] [address] [phone]  [facility]  [place], [place] [address] [phone]  [place]  [address],  [place],  [address] [phone]
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
## 2021-12-17 ENCOUNTER — Ambulatory Visit: Payer: Medicare Other | Admitting: Podiatry

## 2021-12-31 ENCOUNTER — Ambulatory Visit: Payer: Medicare Other | Admitting: Neurology

## 2022-01-08 ENCOUNTER — Encounter: Payer: Self-pay | Admitting: Adult Health

## 2022-01-10 ENCOUNTER — Encounter: Payer: Medicare Other | Admitting: Family

## 2022-01-12 IMAGING — CT CT CHEST W/ CM
3 of 5 series · 17 of 36 positions shown, 19 images · IV contrast (omnipaque)
Comparison: CT 12/27/2019

CLINICAL DATA: Breast cancer restaging. LEFT mastectomy. Rib pain.

EXAM:
CT CHEST WITH CONTRAST
TECHNIQUE: Multidetector CT imaging of the chest was performed during
intravenous contrast administration.
CONTRAST:  75mL OMNIPAQUE IOHEXOL 300 MG/ML  SOLN

[Series 3: axial st · axial · 0.72mm/px · z∈[-422,-182]mm · 8 of 156 slices shown, 10 images]
[im 18/156  mediastinal]
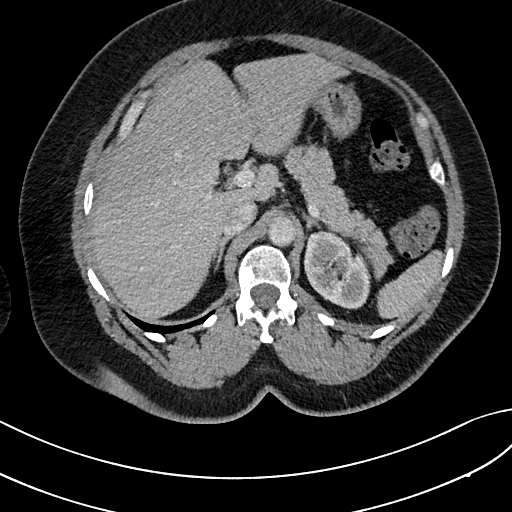
[im 18/156  lung]
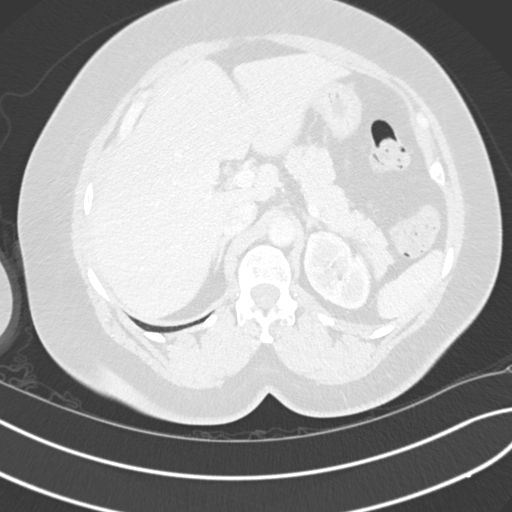
[im 35/156  lung]
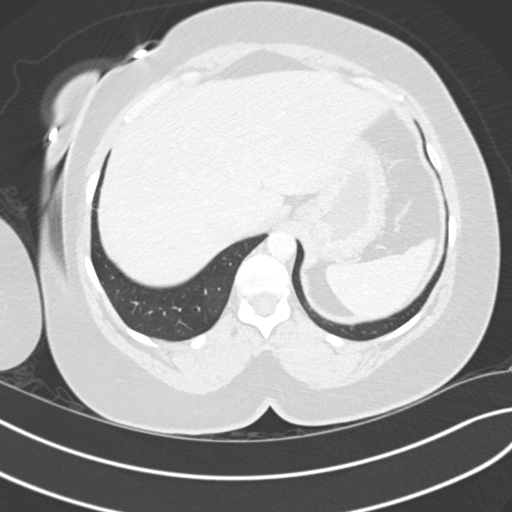
[im 52/156  lung]
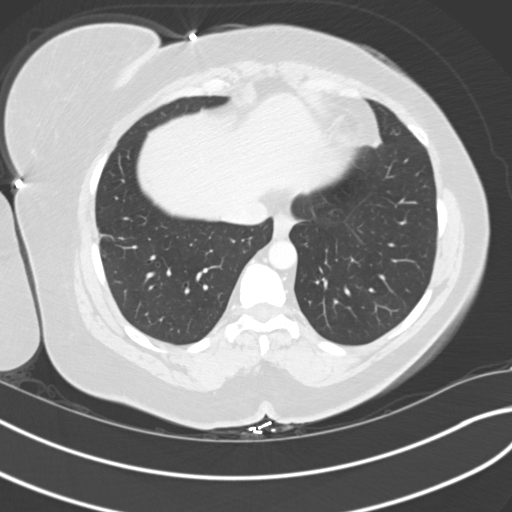
[im 69/156  lung]
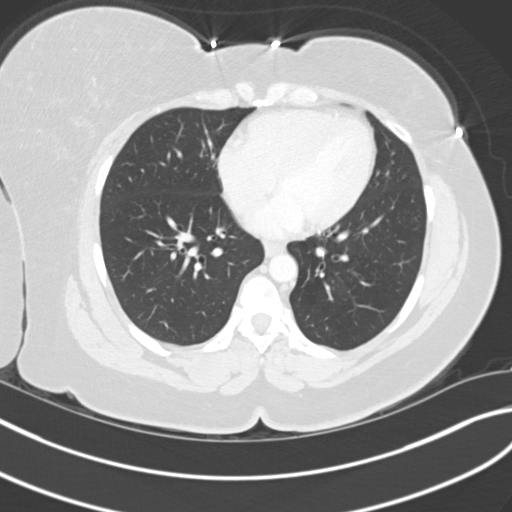
[im 87/156  mediastinal]
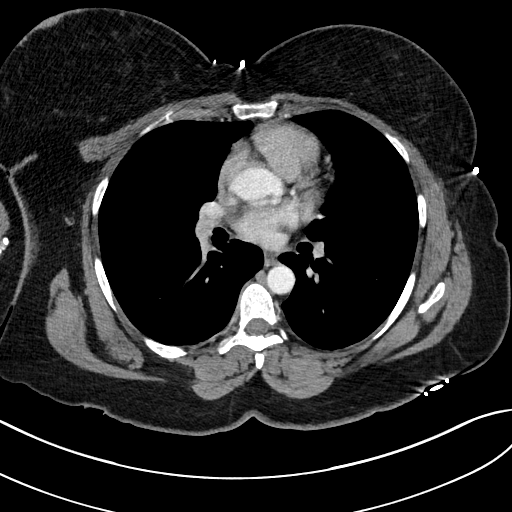
[im 87/156  lung]
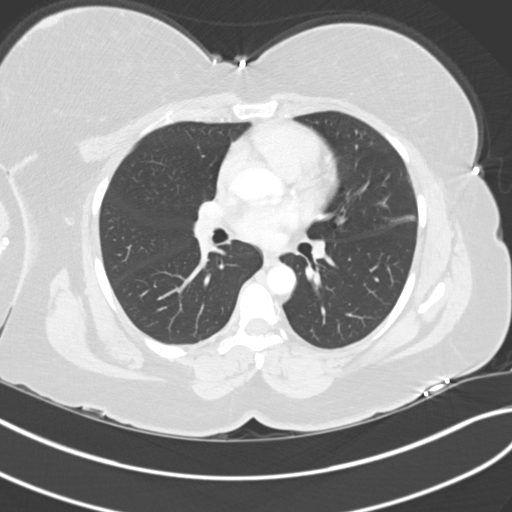
[im 104/156  lung]
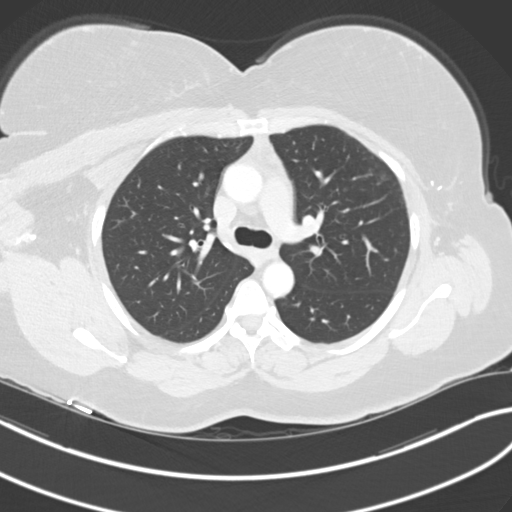
[im 121/156  lung]
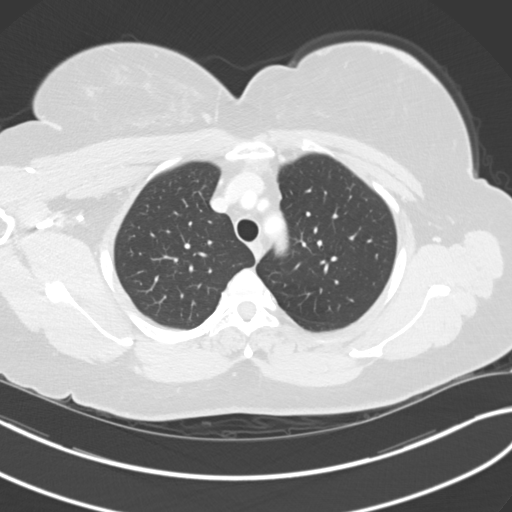
[im 138/156  lung]
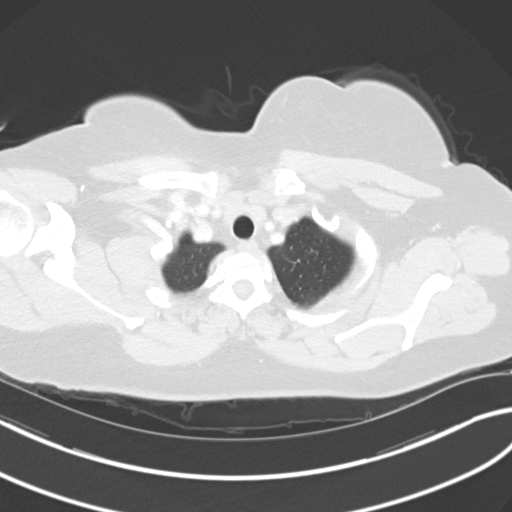

[Series 6: lung · axial · 0.72mm/px · z∈[-418,-224]mm · 6 of 156 slices shown]
[im 20/156  lung]
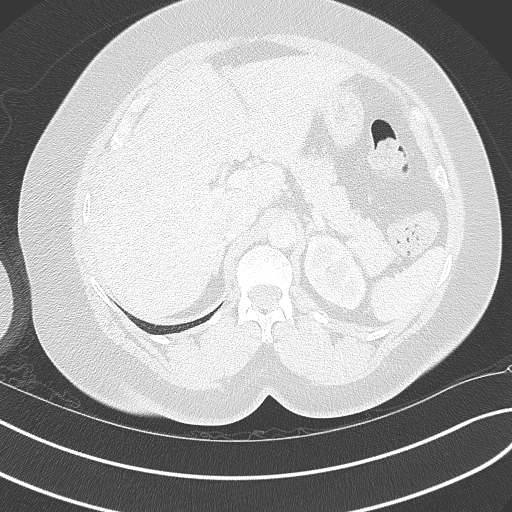
[im 39/156  lung]
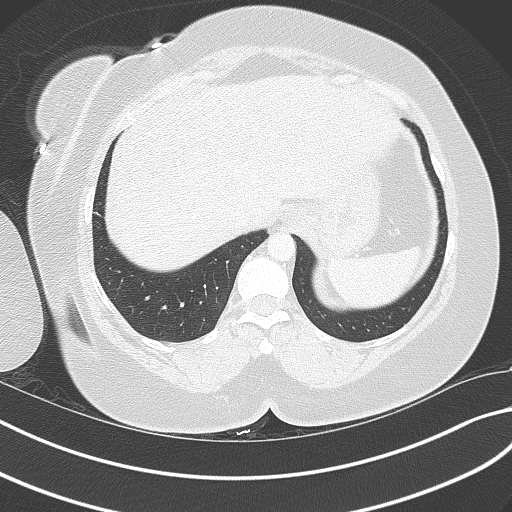
[im 59/156  lung]
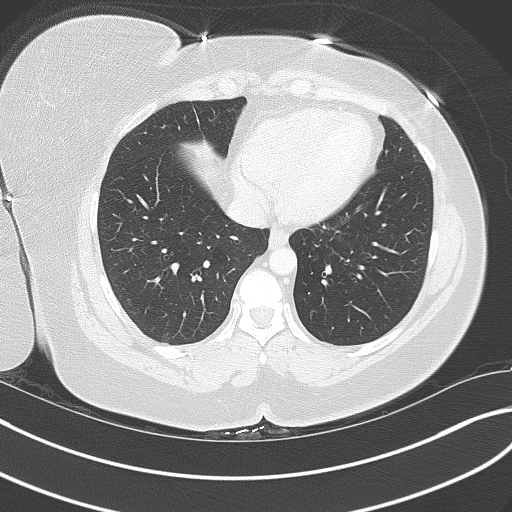
[im 78/156  lung]
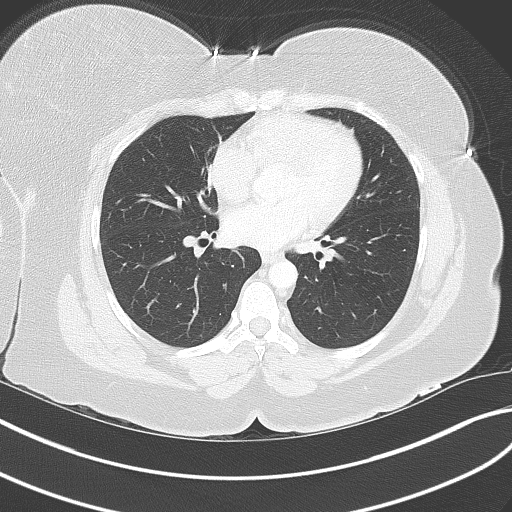
[im 97/156  lung]
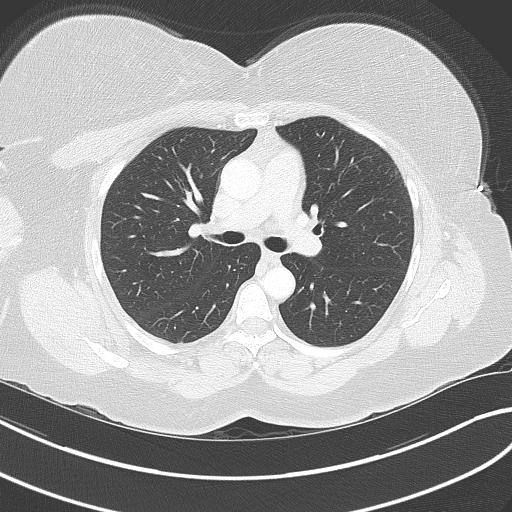
[im 117/156  lung]
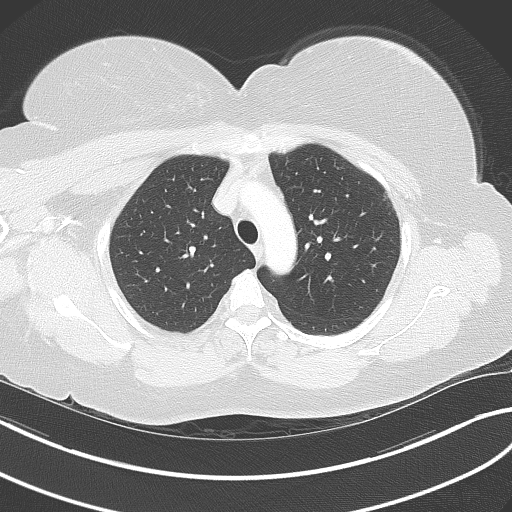

[Series 8: coronal · coronal · 0.62mm/px · 3 of 134 slices shown]
[im 27/134  lung]
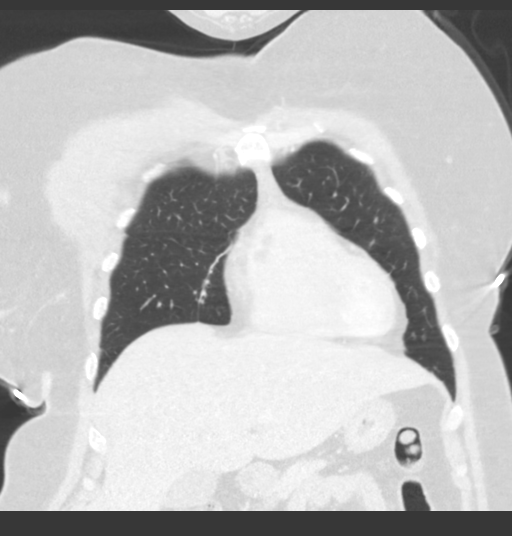
[im 54/134  lung]
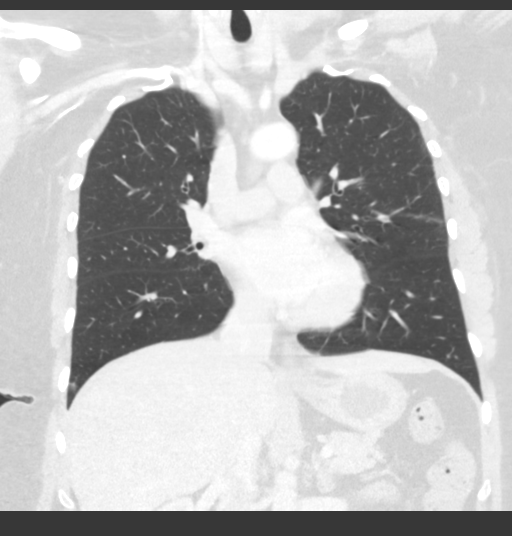
[im 80/134  lung]
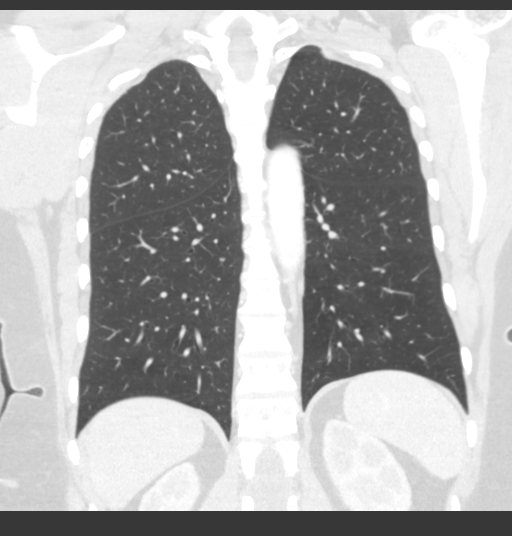

[17 of 36 positions shown; findings below may reference images not displayed]

FINDINGS: Cardiovascular: No significant vascular findings. Normal heart size.
No pericardial effusion.

Mediastinum/Nodes: Small residual thymus in the anterior
mediastinum. No enlarged mediastinal hilar lymph nodes. No
supraclavicular adenopathy. No axillary adenopathy.

Lungs/Pleura: No suspicious pulmonary nodules.  Airways normal.

Upper Abdomen: Limited view of the liver, kidneys, pancreas are
unremarkable. Normal adrenal glands.

Musculoskeletal: Sclerotic lesion in the L1 vertebral body measuring
11 mm unchanged compared back to plain film 08/11/2016. no skeletal
metastasis
IMPRESSION: 1. No evidence breast cancer recurrence or metastasis
2. No skeletal metastasis.

## 2022-01-12 IMAGING — CT CT NECK W/ CM
4 of 5 series · 14 of 33 positions shown, 16 images · IV contrast (omnipaque)
Comparison: Chest CT the same day reported separately, chest CT
12/27/2019. Brain MRI 01/27/2018. No prior neck CT.

CLINICAL DATA: 44-year-old female with history of breast cancer.
Persistent pain, rib pain.

EXAM:
CT NECK WITH CONTRAST
TECHNIQUE: Multidetector CT imaging of the neck was performed using the
standard protocol following the bolus administration of intravenous
contrast.
CONTRAST:  75mL OMNIPAQUE IOHEXOL 300 MG/ML  SOLN

[Series 3: axial neck · axial · 0.45mm/px · z∈[-188,-84]mm · 3 of 106 slices shown]
[im 27/106  bone]
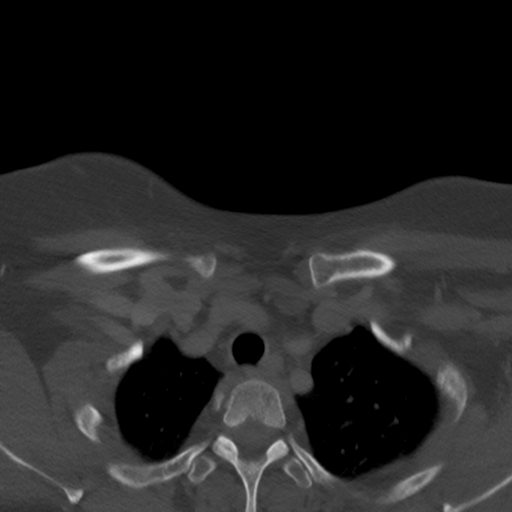
[im 53/106  bone]
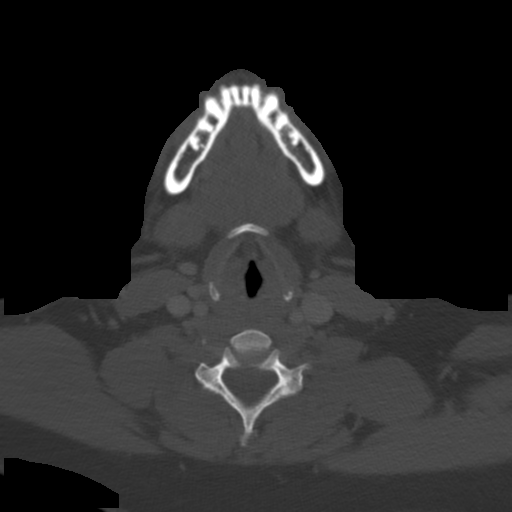
[im 79/106  bone]
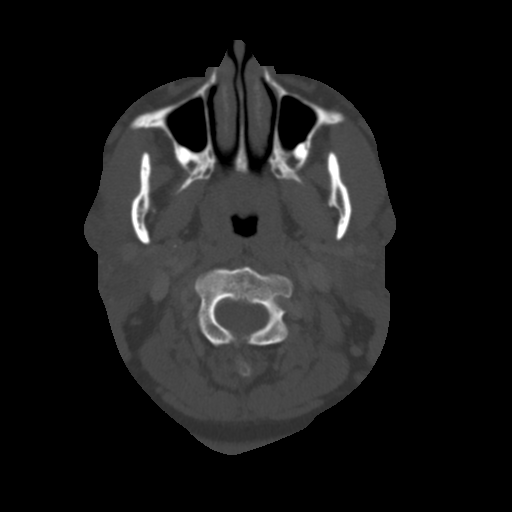

[Series 6: orthogonal (person_name) · axial · 0.39mm/px · z∈[-219,-100]mm · 3 of 123 slices shown, 4 images]
[im 31/123  soft-tissue]
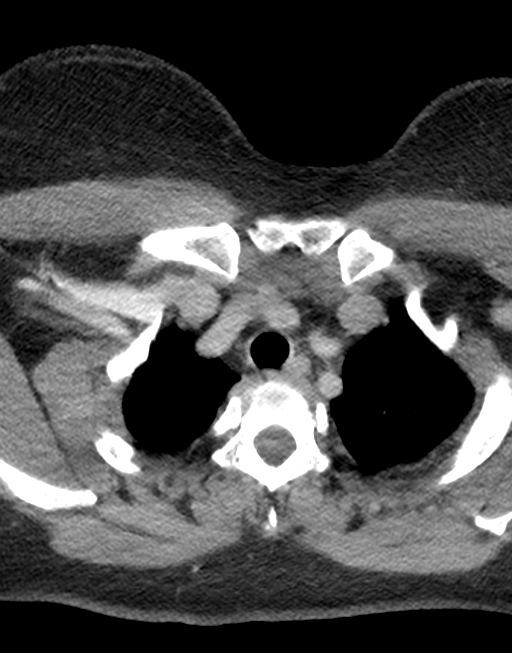
[im 31/123  bone]
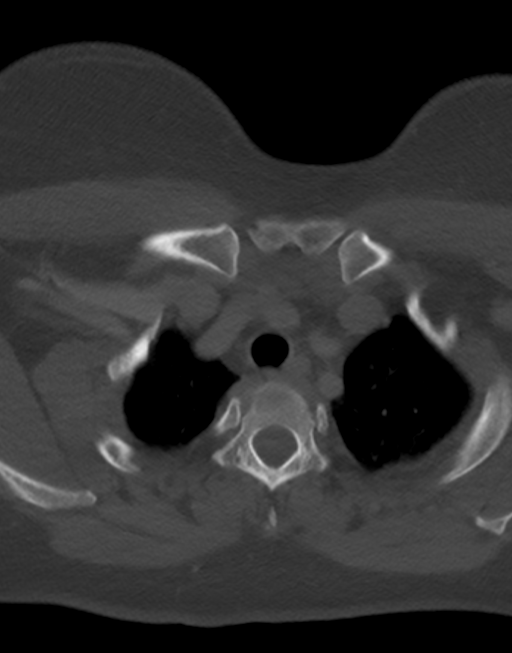
[im 62/123  bone]
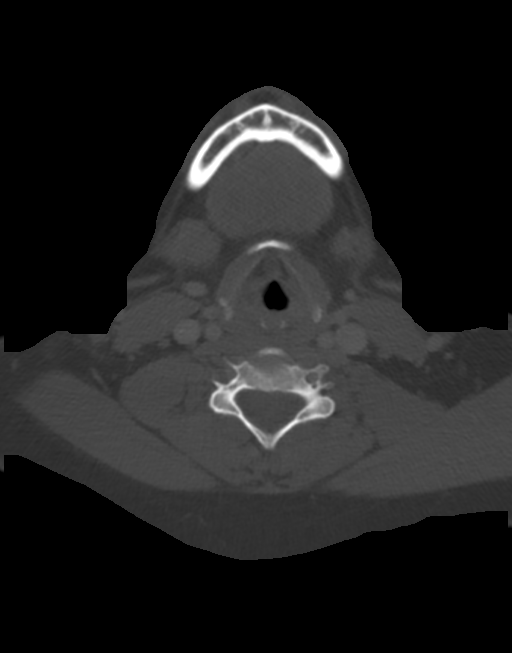
[im 92/123  bone]
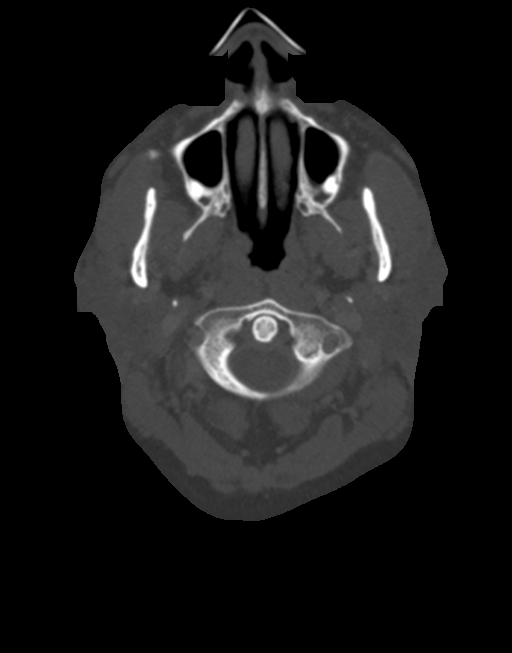

[Series 7: cor neck · coronal · 0.46mm/px · 3 of 115 slices shown]
[im 31/115  bone]
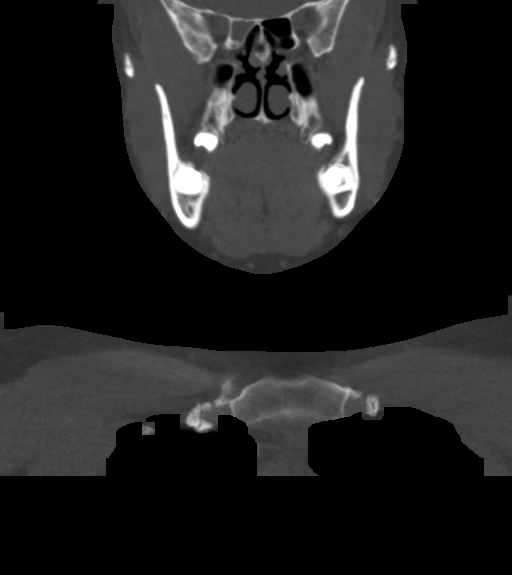
[im 49/115  bone]
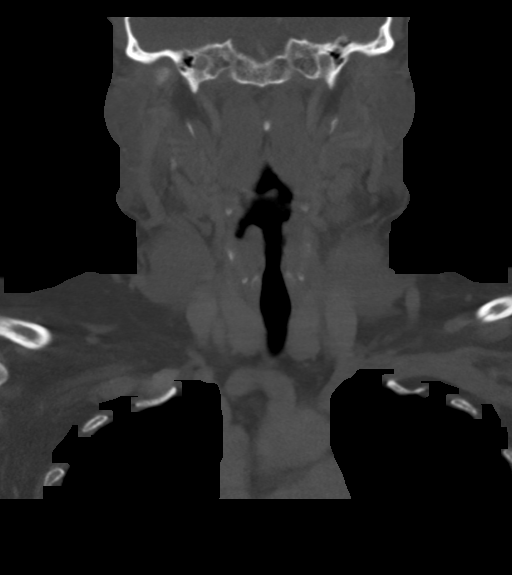
[im 66/115  bone]
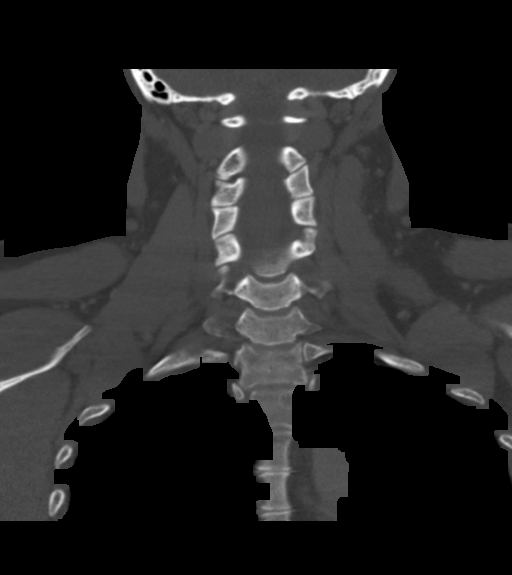

[Series 8: sag neck · sagittal · 0.46mm/px · 5 of 101 slices shown, 6 images]
[im 34/101  bone]
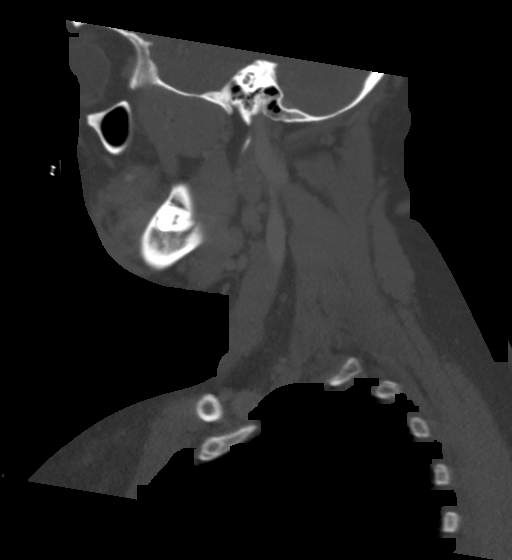
[im 42/101  bone]
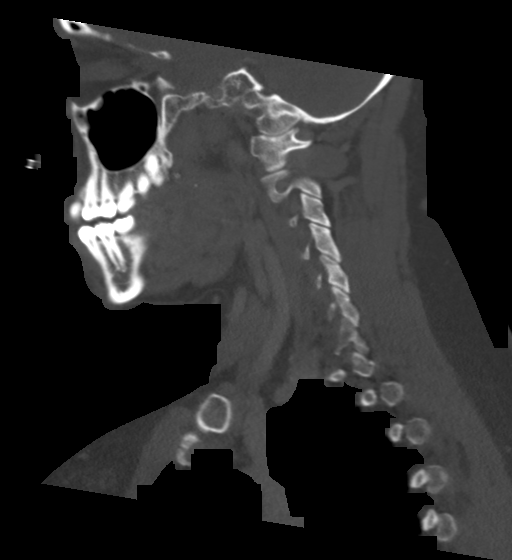
[im 51/101  soft-tissue]
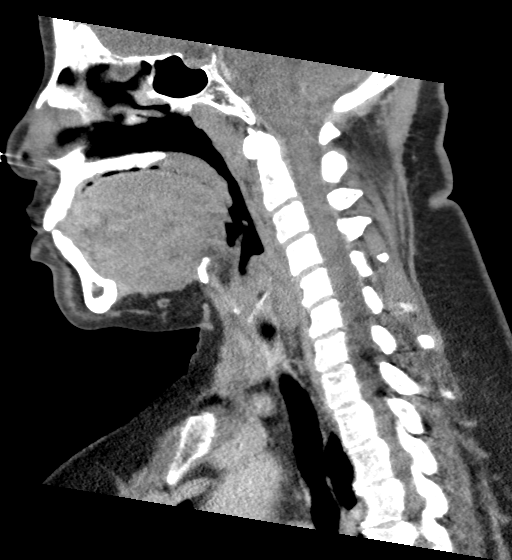
[im 51/101  bone]
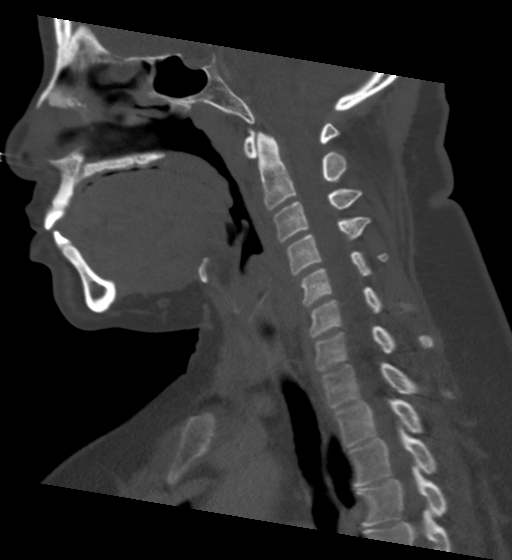
[im 59/101  bone]
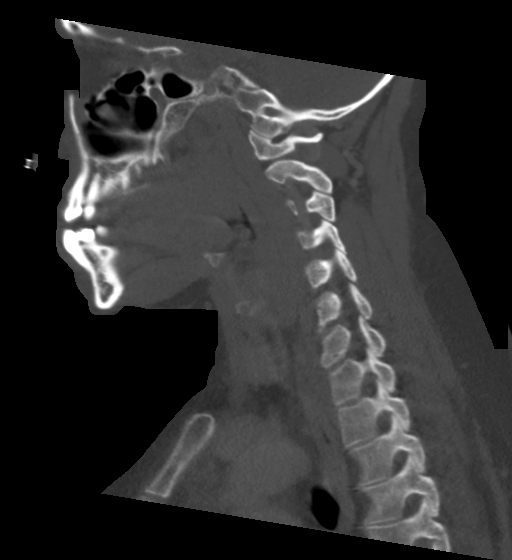
[im 67/101  bone]
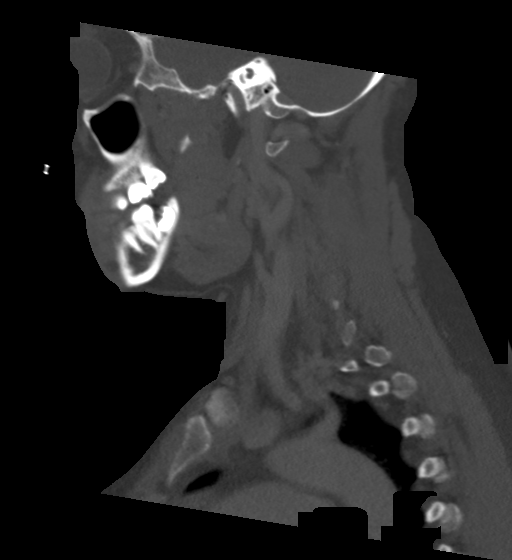

[14 of 33 positions shown; findings below may reference images not displayed]

FINDINGS: Pharynx and larynx: Laryngeal and pharyngeal soft tissue contours
are within normal limits. Motion artifact at the epiglottis.
Parapharyngeal and retropharyngeal spaces are normal.

Salivary glands: Negative sublingual space. Submandibular and
parotid glands appear symmetric and normal.

Thyroid: Negative.

Lymph nodes: Negative. No cervical lymphadenopathy. Bilateral
cervical lymph nodes appear symmetric and within normal limits.

Vascular: Major vascular structures in the neck and at the skull
base are patent.

Limited intracranial: Negative.

Visualized orbits: Negative.

Mastoids and visualized paranasal sinuses: Chronic mucosal
thickening or mucous retention cysts in the posterior right ethmoids
are unchanged from 9582 (series 5, image 10 today), and significance
is doubtful. Other paranasal sinuses, tympanic cavities and mastoids
remain well aerated.

Skeleton: Negative. No acute or suspicious osseous lesion
identified.

Upper chest: Detailed separately, see Chest CT from the same day.
IMPRESSION: 1. Negative CT appearance of the Neck.
2. See also Chest CT from the same day reported separately.

## 2022-01-13 NOTE — Progress Notes (Signed)
  This encounter was created in error - please disregard. No show 

## 2022-01-20 ENCOUNTER — Encounter (HOSPITAL_COMMUNITY): Payer: Medicare Other | Admitting: Student in an Organized Health Care Education/Training Program

## 2022-01-21 ENCOUNTER — Ambulatory Visit: Payer: Medicare Other | Admitting: Podiatry

## 2022-02-12 ENCOUNTER — Encounter (INDEPENDENT_AMBULATORY_CARE_PROVIDER_SITE_OTHER): Payer: Self-pay

## 2022-02-19 ENCOUNTER — Telehealth: Payer: Self-pay | Admitting: Surgery

## 2022-02-19 NOTE — Telephone Encounter (Signed)
Pt had called our office requesting an appointment due to pain under her arm on her left side that is radiating to her shoulder, as well as left breast pain and right breast fullness.  The pt is currently on vacation, so she could not be seen until next week.  Wilber Bihari, NP, will also be gone next week.    I called symptom management, and the nurse  Apolonio Schneiders working with Sherol Dade, PA, stated that they could see the pt Monday, August 21, at 10 am.    I called the pt back and she stated that the appointment date and time suited her, and she was very appreciative of being seen so quickly.

## 2022-02-24 ENCOUNTER — Ambulatory Visit (HOSPITAL_COMMUNITY)
Admission: RE | Admit: 2022-02-24 | Discharge: 2022-02-24 | Disposition: A | Discharge status: Home / Self Care | Payer: Medicare Other | Source: Ambulatory Visit | Attending: Physician Assistant | Admitting: Physician Assistant

## 2022-02-24 ENCOUNTER — Inpatient Hospital Stay: Payer: Medicare Other | Attending: Adult Health | Admitting: Physician Assistant

## 2022-02-24 ENCOUNTER — Other Ambulatory Visit: Payer: Self-pay

## 2022-02-24 ENCOUNTER — Telehealth: Payer: Self-pay

## 2022-02-24 ENCOUNTER — Other Ambulatory Visit: Payer: Self-pay | Admitting: Physician Assistant

## 2022-02-24 VITALS — BP 123/82 | HR 89 | Temp 97.8°F | Resp 18 | Wt 213.7 lb

## 2022-02-24 DIAGNOSIS — Z170 Estrogen receptor positive status [ER+]: Secondary | ICD-10-CM | POA: Diagnosis not present

## 2022-02-24 DIAGNOSIS — Z923 Personal history of irradiation: Secondary | ICD-10-CM | POA: Insufficient documentation

## 2022-02-24 DIAGNOSIS — Z9221 Personal history of antineoplastic chemotherapy: Secondary | ICD-10-CM | POA: Insufficient documentation

## 2022-02-24 DIAGNOSIS — M25512 Pain in left shoulder: Secondary | ICD-10-CM | POA: Insufficient documentation

## 2022-02-24 DIAGNOSIS — C50412 Malignant neoplasm of upper-outer quadrant of left female breast: Secondary | ICD-10-CM | POA: Diagnosis present

## 2022-02-24 DIAGNOSIS — Z79810 Long term (current) use of selective estrogen receptor modulators (SERMs): Secondary | ICD-10-CM | POA: Insufficient documentation

## 2022-02-24 DIAGNOSIS — N644 Mastodynia: Secondary | ICD-10-CM | POA: Insufficient documentation

## 2022-02-24 DIAGNOSIS — Z809 Family history of malignant neoplasm, unspecified: Secondary | ICD-10-CM | POA: Diagnosis not present

## 2022-02-24 DIAGNOSIS — N6489 Other specified disorders of breast: Secondary | ICD-10-CM | POA: Diagnosis not present

## 2022-02-24 DIAGNOSIS — Z8052 Family history of malignant neoplasm of bladder: Secondary | ICD-10-CM | POA: Insufficient documentation

## 2022-02-24 DIAGNOSIS — M069 Rheumatoid arthritis, unspecified: Secondary | ICD-10-CM | POA: Insufficient documentation

## 2022-02-24 DIAGNOSIS — M79602 Pain in left arm: Secondary | ICD-10-CM | POA: Diagnosis not present

## 2022-02-24 NOTE — Telephone Encounter (Signed)
Mammogram scheduled for 8/24 at 1pm. Called patient to notify her of date and time, she verbalized understanding. Patient given Breast Center scheduling phone number in case she needs to reschedule.
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
## 2022-02-24 NOTE — Patient Instructions (Signed)
Dr. Doran Durand is our on call orthopedist today. You can call [facility] to schedule an appointment to evaluate your shoulder pain.  Address: [address], [place], [place] [address] Phone: [phone]   If you have difficulty getting into see the orthopedist you can try to see the sports medicine group instead.  The on-call doctor today is Dr. Zachery Dakins at [facility]. Either the ortho group or sports medicine group will be able to evaluate your shoulder pain.  Address: [address], [place], [place] [address] Phone: [phone]

## 2022-02-25 ENCOUNTER — Ambulatory Visit (INDEPENDENT_AMBULATORY_CARE_PROVIDER_SITE_OTHER): Payer: Medicare Other | Admitting: Clinical

## 2022-02-25 ENCOUNTER — Encounter: Payer: Self-pay | Admitting: Rehabilitation

## 2022-02-25 ENCOUNTER — Ambulatory Visit: Payer: Medicare Other | Attending: Adult Health | Admitting: Rehabilitation

## 2022-02-25 ENCOUNTER — Other Ambulatory Visit (HOSPITAL_COMMUNITY): Payer: Self-pay | Admitting: Student in an Organized Health Care Education/Training Program

## 2022-02-25 ENCOUNTER — Telehealth (HOSPITAL_COMMUNITY): Payer: Self-pay | Admitting: Student in an Organized Health Care Education/Training Program

## 2022-02-25 DIAGNOSIS — Z9189 Other specified personal risk factors, not elsewhere classified: Secondary | ICD-10-CM | POA: Diagnosis present

## 2022-02-25 DIAGNOSIS — C50412 Malignant neoplasm of upper-outer quadrant of left female breast: Secondary | ICD-10-CM | POA: Diagnosis not present

## 2022-02-25 DIAGNOSIS — F331 Major depressive disorder, recurrent, moderate: Secondary | ICD-10-CM

## 2022-02-25 DIAGNOSIS — M25512 Pain in left shoulder: Secondary | ICD-10-CM | POA: Insufficient documentation

## 2022-02-25 DIAGNOSIS — Z170 Estrogen receptor positive status [ER+]: Secondary | ICD-10-CM | POA: Insufficient documentation

## 2022-02-25 MED ORDER — DULOXETINE HCL 60 MG PO CPEP: 60.0000 mg | ORAL_CAPSULE | Freq: Every day | ORAL | 2 refills | Status: DC

## 2022-02-25 NOTE — Telephone Encounter (Signed)
Done, thanks for making sure that the patient has a future appt.

## 2022-02-25 NOTE — Progress Notes (Signed)
Telephone Encounter  Received message that patient needed refills after missing her last appt due to being out of town. Patient did present for a therapy visit with [employer], LCSW. After speaking with Arby Barrette, this provider had concerns that patient may  have missed some days. This provider called patient and patient reported she had not missed any pills, but was down to 2 and was worried about discontinuation syndrome and was requesting refills before this happened.   Provider will refill for patient as is, as patient has been compliant.  Patient also endorsed intent to f/u in 03/2022.   PGY-3 Damita Dunnings, MD

## 2022-02-27 ENCOUNTER — Ambulatory Visit
Admission: RE | Admit: 2022-02-27 | Discharge: 2022-02-27 | Disposition: A | Discharge status: Home / Self Care | Payer: Medicare Other | Source: Ambulatory Visit | Attending: Physician Assistant | Admitting: Physician Assistant

## 2022-02-27 DIAGNOSIS — Z170 Estrogen receptor positive status [ER+]: Secondary | ICD-10-CM

## 2022-02-28 ENCOUNTER — Ambulatory Visit: Payer: Medicare Other | Admitting: Rehabilitation

## 2022-03-05 ENCOUNTER — Encounter: Payer: Self-pay | Admitting: Rehabilitation

## 2022-03-13 ENCOUNTER — Ambulatory Visit (HOSPITAL_BASED_OUTPATIENT_CLINIC_OR_DEPARTMENT_OTHER): Payer: Medicare Other | Admitting: Physical Therapy

## 2022-03-16 ENCOUNTER — Encounter (HOSPITAL_COMMUNITY): Payer: Self-pay

## 2022-03-16 NOTE — Plan of Care (Signed)
  Problem: Depression CCP Problem  1  Goal: LTG: Saoirse WILL SCORE LESS THAN 10 ON THE PATIENT HEALTH QUESTIONNAIRE (PHQ-9) Outcome: Initial Goal: STG: Cand[facility] WILL IDENTIFY 3 COGNITIVE PATTERNS AND BELIEFS THAT SUPPORT DEPRESSION Outcome: Initial Goal: STG: Priyah WILL PRACTICE BEHAVIORAL ACTIVATION SKILLS 4 TIMES PER WEEK FOR THE NEXT 12 WEEKS Outcome: Initial
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
## 2022-03-17 ENCOUNTER — Ambulatory Visit (HOSPITAL_BASED_OUTPATIENT_CLINIC_OR_DEPARTMENT_OTHER): Payer: Medicare Other | Admitting: Physical Therapy

## 2022-03-17 ENCOUNTER — Telehealth (INDEPENDENT_AMBULATORY_CARE_PROVIDER_SITE_OTHER): Payer: Medicare Other | Admitting: Student in an Organized Health Care Education/Training Program

## 2022-03-17 DIAGNOSIS — F331 Major depressive disorder, recurrent, moderate: Secondary | ICD-10-CM

## 2022-03-17 MED ORDER — TRAZODONE HCL 50 MG PO TABS: ORAL_TABLET | ORAL | 1 refills | Status: DC

## 2022-03-17 MED ORDER — DULOXETINE HCL 30 MG PO CPEP: 90.0000 mg | ORAL_CAPSULE | Freq: Every day | ORAL | 2 refills | Status: DC
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
## 2022-03-19 ENCOUNTER — Ambulatory Visit (INDEPENDENT_AMBULATORY_CARE_PROVIDER_SITE_OTHER): Payer: Medicare Other | Admitting: Clinical

## 2022-03-19 DIAGNOSIS — F331 Major depressive disorder, recurrent, moderate: Secondary | ICD-10-CM | POA: Diagnosis not present

## 2022-03-19 NOTE — Progress Notes (Signed)
THERAPIST PROGRESS NOTE Virtual Visit via Video Note  I connected with [name] on 03/19/22 at 11:00 AM EDT by a video enabled telemedicine application and verified that I am speaking with the correct person using two identifiers.  Location: Patient: home Provider: office   I discussed the limitations of evaluation and management by telemedicine and the availability of in person appointments. The patient expressed understanding and agreed to proceed.   Follow Up Instructions: I discussed the assessment and treatment plan with the patient. The patient was provided an opportunity to ask questions and all were answered. The patient agreed with the plan and demonstrated an understanding of the instructions.   The patient was advised to call back or seek an in-person evaluation if the symptoms worsen or if the condition fails to improve as anticipated.    Session Time: 45 minutes  Participation Level: Active  Behavioral Response: CasualAlertDepressed  Type of Therapy: Individual Therapy  Treatment Goals addressed: client will identify 3 cognitive patterns and beliefs that support depression.  ProgressTowards Goals: Progressing  Interventions: CBT and Supportive  Summary:  [name] is a 46 y.o. female who presents for the scheduled appointment oriented x5, appropriately dressed, and friendly.  Client denied hallucinations and delusions. Client reported on today she has been recovering from a dental procedure as well as a cold or virus that she contracted from summer.  Client reported due to her pre-existing health conditions that can make it harder for her to recover from her procedure sickness.  Client reported otherwise she has been feeling depressed because it is the 1 year anniversary of her grandmother's passing.  Client reported out of her siblings she had closest relationship with her grandmother.  Client reported her grandmother lived with her family  up through high school.  Client reported she also went to live with her grandmother after her first marriage and she was raising her newborn son.  Client reported her grandmother helped raise her in daily will call to check on her. Evidence of progress towards goal:  client reported 1 positive which was keeping her appointment today instead of canceling as she would do by hx.    Suicidal/Homicidal: Nowithout intent/plan  Therapist Response:  Therapist began the appointment asking the client how she has been doing since last seen. Therapist used CBT to engage using active listening and positive emotional support. Therapist used CBT to engage with the client and ask her about triggers impacting her depression symptoms. Therapist used CBT to discuss the grief cycle. Therapist used CBT to discuss the ideal changes she would like to see and behavioral activation techniques. Therapist used CBT ask the client to identify her progress with frequency of use with coping skills with continued practice in her daily activity.    Therapist assigned the client homework to practice self care and giving herself one day per week with a planned enjoyed activity.    Plan: Return again in 4 weeks.  Diagnosis: MDD, recurrent episode, moderate  Collaboration of Care: Patient refused AEB client denied other requests at this time.  Patient/Guardian was advised Release of Information must be obtained prior to any record release in order to collaborate their care with an outside provider. Patient/Guardian was advised if they have not already done so to contact the registration department to sign all necessary forms in order for [place] to release information regarding their care.   Consent: Patient/Guardian gives verbal consent for treatment and assignment of benefits for services provided  during this visit. Patient/Guardian expressed understanding and agreed to proceed.   [place], LCSW 03/19/2022

## 2022-03-23 NOTE — Plan of Care (Signed)
  Problem: Depression CCP Problem  1  Goal: STG: [name] WILL IDENTIFY 3 COGNITIVE PATTERNS AND BELIEFS THAT SUPPORT DEPRESSION Outcome: Progressing   Problem: Depression CCP Problem  1  Goal: LTG: [name] WILL SCORE LESS THAN 10 ON THE PATIENT HEALTH QUESTIONNAIRE (PHQ-9) Outcome: Not Progressing Goal: STG: [name] WILL PRACTICE BEHAVIORAL ACTIVATION SKILLS 4 TIMES PER WEEK FOR THE NEXT 12 WEEKS Outcome: Not Progressing

## 2022-03-26 ENCOUNTER — Ambulatory Visit (HOSPITAL_BASED_OUTPATIENT_CLINIC_OR_DEPARTMENT_OTHER): Payer: Medicare Other | Admitting: Physical Therapy

## 2022-04-02 ENCOUNTER — Ambulatory Visit (HOSPITAL_BASED_OUTPATIENT_CLINIC_OR_DEPARTMENT_OTHER): Payer: Medicare Other | Admitting: Physical Therapy

## 2022-04-09 ENCOUNTER — Ambulatory Visit (INDEPENDENT_AMBULATORY_CARE_PROVIDER_SITE_OTHER): Payer: Medicare Other | Admitting: Clinical

## 2022-04-09 DIAGNOSIS — F331 Major depressive disorder, recurrent, moderate: Secondary | ICD-10-CM

## 2022-04-09 NOTE — Progress Notes (Signed)
THERAPIST PROGRESS NOTE Virtual Visit via Video Note  I connected with [name] on 04/09/2022 at 11:00 AM EDT by a video enabled telemedicine application and verified that I am speaking with the correct person using two identifiers.  Location: Patient: home Provider: office   I discussed the limitations of evaluation and management by telemedicine and the availability of in person appointments. The patient expressed understanding and agreed to proceed.   Follow Up Instructions: I discussed the assessment and treatment plan with the patient. The patient was provided an opportunity to ask questions and all were answered. The patient agreed with the plan and demonstrated an understanding of the instructions.   The patient was advised to call back or seek an in-person evaluation if the symptoms worsen or if the condition fails to improve as anticipated.   Session Time: 30 minutes  Participation Level: Active  Behavioral Response: CasualAlertDepressed  Type of Therapy: Individual Therapy  Treatment Goals addressed: Client will identify 3 cognitive patterns and believes that support depression  ProgressTowards Goals: Progressing  Interventions: CBT and Supportive  Summary:  [name] is a 46 y.o. female who presents for the scheduled appointment oriented x5, appropriately dressed, and friendly.  Client denied hallucinations and delusions. Client reported on today she has been doing the same with managing her depressive symptoms.  Client reported she has had a medication adjustment but it has not been effective with helping her mood.  Client reported otherwise she has been generally more to reflect over her thoughts and emotions.  Client reported she was asked to speak a breast cancer client but she is unsure she did something that she wants to do.  Client reported they were dealing with a painful experience in a dark time that she is working on processing.   Client reported she was previously engaged with cancer support groups and even made friends.  Client reported over time those friends and other members in the group passed away.  Client reported he just seemed like a anxiety and depression provoking trigger that caused her to think about what if the cancer returns for her.  Client reported she finds that it is hard to manage the fear of her cancer coming back as she is sensitive to every pain in her element that she feels she thinks it is correlated to that.  Client reported this weekend her family history in her mother a birthday party.  Client reported she has anxiety about obtaining the event because everyone always ask her about the cancer and how she is feeling.  Client reported others have defined her by her diagnosis.  Client reported that is something she is working on herself of taking control of her life.  Client reported she has a hard time staying out of bed and having the motivation to do things. Evidence of progress towards goal: Client reported she is journaling her thoughts and feelings at least 5 out of 7 days a week.  Suicidal/Homicidal: Nowithout intent/plan  Therapist Response:  Therapist began the appointment asking client how she has been doing since last seen. Therapist used CBT to engage using active listening and positive emotional support. Therapist used CBT to address the client about her continued management for depressive symptoms. Therapist used CBT to allow the client time to discuss contributing factors regarding her health that impact how she views herself and interaction with others. Therapist used CBT ask the client to identify her progress with frequency of use with coping skills  with continued practice in her daily activity.    Therapist assigned client homework to practice positive self talk and creating small routine to help get her out of the bed.   Plan: Return again in 3 weeks.  Diagnosis: MDD, recurrent  episode, moderate  Collaboration of Care: Patient refused AEB no other needs requested by the client at this time.  Patient/Guardian was advised Release of Information must be obtained prior to any record release in order to collaborate their care with an outside provider. Patient/Guardian was advised if they have not already done so to contact the registration department to sign all necessary forms in order for [place] to release information regarding their care.   Consent: Patient/Guardian gives verbal consent for treatment and assignment of benefits for services provided during this visit. Patient/Guardian expressed understanding and agreed to proceed.   [place], LCSW 04/09/2022

## 2022-04-12 NOTE — Plan of Care (Signed)
  Problem: Depression CCP Problem  1  Goal: LTG: Nixon WILL SCORE LESS THAN 10 ON THE PATIENT HEALTH QUESTIONNAIRE (PHQ-9) Outcome: Progressing Goal: STG: Caran WILL IDENTIFY 3 COGNITIVE PATTERNS AND BELIEFS THAT SUPPORT DEPRESSION Outcome: Progressing Goal: STG: Candida WILL PRACTICE BEHAVIORAL ACTIVATION SKILLS 4 TIMES PER WEEK FOR THE NEXT 12 WEEKS Outcome: Progressing

## 2022-04-14 ENCOUNTER — Ambulatory Visit: Payer: Medicare Other | Admitting: Physical Medicine and Rehabilitation

## 2022-04-15 ENCOUNTER — Encounter (HOSPITAL_COMMUNITY): Payer: Self-pay | Admitting: Student in an Organized Health Care Education/Training Program

## 2022-04-15 ENCOUNTER — Telehealth (INDEPENDENT_AMBULATORY_CARE_PROVIDER_SITE_OTHER): Payer: Medicare Other | Admitting: Student in an Organized Health Care Education/Training Program

## 2022-04-15 DIAGNOSIS — F331 Major depressive disorder, recurrent, moderate: Secondary | ICD-10-CM

## 2022-04-15 MED ORDER — ARIPIPRAZOLE 2 MG PO TABS: 2.0000 mg | ORAL_TABLET | Freq: Every day | ORAL | 1 refills | Status: DC

## 2022-04-15 MED ORDER — HYDROXYZINE HCL 25 MG PO TABS: 25.0000 mg | ORAL_TABLET | Freq: Three times a day (TID) | ORAL | 2 refills | Status: DC | PRN

## 2022-04-15 MED ORDER — TRAZODONE HCL 50 MG PO TABS: ORAL_TABLET | ORAL | 1 refills | Status: DC

## 2022-04-15 MED ORDER — DULOXETINE HCL 30 MG PO CPEP
90.0000 mg | ORAL_CAPSULE | Freq: Every day | ORAL | 2 refills | Status: DC
Start: 2022-04-15 — End: 2022-05-01
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
## 2022-04-21 ENCOUNTER — Ambulatory Visit: Payer: Medicare Other | Admitting: Neurology

## 2022-05-02 ENCOUNTER — Encounter: Payer: Medicare Other | Admitting: Physical Medicine and Rehabilitation

## 2022-05-14 ENCOUNTER — Ambulatory Visit: Payer: Medicare Other | Admitting: Internal Medicine

## 2022-05-20 ENCOUNTER — Telehealth (HOSPITAL_COMMUNITY): Payer: Medicare Other | Admitting: Student in an Organized Health Care Education/Training Program

## 2022-06-06 ENCOUNTER — Encounter (HOSPITAL_COMMUNITY): Payer: Medicare Other | Admitting: Student in an Organized Health Care Education/Training Program

## 2022-06-06 ENCOUNTER — Encounter (HOSPITAL_COMMUNITY): Payer: Self-pay

## 2022-06-06 ENCOUNTER — Encounter (HOSPITAL_COMMUNITY): Payer: Self-pay | Admitting: Student in an Organized Health Care Education/Training Program

## 2022-06-06 DIAGNOSIS — F410 Panic disorder [episodic paroxysmal anxiety] without agoraphobia: Secondary | ICD-10-CM

## 2022-06-06 MED ORDER — HYDROXYZINE HCL 25 MG PO TABS: ORAL_TABLET | ORAL | 3 refills | Status: DC

## 2022-06-06 NOTE — Progress Notes (Signed)
Patient is currently in [place], but did answer video called.  Patient and provider discussed, that provider does not have a license to practice in the state and therefore cannot move forward with appointment.  Patient was understanding.  Patient denied any SI.  Patient did endorse that she had trialed the Abilify for a few weeks but had noticed dizziness and did not continue the medication.  We will have patient rescheduled for next week as patient also endorsed feeling dysphoric.  PGY-3 Damita Dunnings, MD

## 2022-06-12 ENCOUNTER — Encounter (HOSPITAL_COMMUNITY): Payer: Self-pay

## 2022-06-12 ENCOUNTER — Telehealth (INDEPENDENT_AMBULATORY_CARE_PROVIDER_SITE_OTHER): Payer: Medicare Other | Admitting: Student in an Organized Health Care Education/Training Program

## 2022-06-12 ENCOUNTER — Telehealth (HOSPITAL_COMMUNITY): Payer: Medicare Other | Admitting: Student in an Organized Health Care Education/Training Program

## 2022-06-12 DIAGNOSIS — F331 Major depressive disorder, recurrent, moderate: Secondary | ICD-10-CM | POA: Diagnosis not present

## 2022-06-12 MED ORDER — TRAZODONE HCL 50 MG PO TABS: ORAL_TABLET | ORAL | 1 refills | Status: DC

## 2022-06-12 MED ORDER — MIRTAZAPINE 7.5 MG PO TABS: 7.5000 mg | ORAL_TABLET | Freq: Every day | ORAL | 1 refills | Status: DC

## 2022-06-12 MED ORDER — DULOXETINE HCL 30 MG PO CPEP: 90.0000 mg | ORAL_CAPSULE | Freq: Every day | ORAL | 2 refills | Status: DC
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
## 2022-06-13 ENCOUNTER — Ambulatory Visit (INDEPENDENT_AMBULATORY_CARE_PROVIDER_SITE_OTHER): Payer: Medicare Other | Admitting: Clinical

## 2022-06-13 DIAGNOSIS — F332 Major depressive disorder, recurrent severe without psychotic features: Secondary | ICD-10-CM | POA: Diagnosis not present

## 2022-06-15 NOTE — Progress Notes (Signed)
THERAPIST PROGRESS NOTE Virtual Visit via Video Note  I connected with [name] on 06/13/2022 at 10:00 AM EST by a video enabled telemedicine application and verified that I am speaking with the correct person using two identifiers.  Location: Patient: home Provider: office   I discussed the limitations of evaluation and management by telemedicine and the availability of in person appointments. The patient expressed understanding and agreed to proceed.   Follow Up Instructions: I discussed the assessment and treatment plan with the patient. The patient was provided an opportunity to ask questions and all were answered. The patient agreed with the plan and demonstrated an understanding of the instructions.   The patient was advised to call back or seek an in-person evaluation if the symptoms worsen or if the condition fails to improve as anticipated.    Session Time: 45 minutes  Participation Level: Active  Behavioral Response: CasualAlertDepressed  Type of Therapy: Individual Therapy  Treatment Goals addressed: client will identify 3 cognitive patterns and beliefs that support depression  ProgressTowards Goals: Progressing  Interventions: CBT and Supportive  Summary:  [name] is a 46 y.o. female who presents for the scheduled appointment oriented times five, appropriately dressed, and friendly. Client denied hallucinations and delusions. Client reported on today feeling depressed. Client reported during thanksgiving she traveled to [place] to visit her husband where he lives because he was unable to travel home. Client reported her husband talked with her about her needing to make a choice if she want to move to stay with him or choose her to stay with her children. Client reported he said the conversation in such as way that made her feel like the weight of th world is on her shoulders. Client reported she has not talked to family about the situation  because she knows what'd they about "well you knew what he wanted". Client reported she does not want to be in a position where she has to choose between the two. Client reported when they married years ago circumstances were different when she was going to move to be with him but her health declined and now she is going to have a second grandchild. Client reported her husband made the comment that he does not want a part time wife. Client reported the stress has been exasperating her health symptoms. Evidence of progress towards goal:  client reported 1 source of depression symptoms as it relates to her family.     Suicidal/Homicidal: Nowithout intent/plan  Therapist Response:  Therapist began the appointment asking the client how she has been doing since last seen. Therapist used CBT to engage using active listening and positive emotional support. Therapist used CBT to ask the client open ended questions about her stressors related to her depression. Therapist used CBT to engage ask the client about her thought process thus far to problem solve her conflicts. Therapist used CBT to normalize her client emotions and discuss using moral/ values pacing herself to resolve things to think them through. Therapist used CBT ask the client to identify her progress with frequency of use with coping skills with continued practice in her daily activity.    Therapist assigned the client homework to practice self care.    Plan: Return again in 3 weeks.  Diagnosis: severe episode of recurrent major depressive disorder, without psychotic features  Collaboration of Care: Patient refused AEB none requested by the client.  Patient/Guardian was advised Release of Information must be obtained prior to any record  release in order to collaborate their care with an outside provider. Patient/Guardian was advised if they have not already done so to contact the registration department to sign all necessary forms in  order for [place] to release information regarding their care.   Consent: Patient/Guardian gives verbal consent for treatment and assignment of benefits for services provided during this visit. Patient/Guardian expressed understanding and agreed to proceed.   [place], LCSW 06/13/2022
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
## 2022-06-26 ENCOUNTER — Ambulatory Visit (INDEPENDENT_AMBULATORY_CARE_PROVIDER_SITE_OTHER): Payer: Medicare Other | Admitting: Obstetrics and Gynecology

## 2022-06-26 ENCOUNTER — Encounter: Payer: Self-pay | Admitting: Obstetrics and Gynecology

## 2022-06-26 ENCOUNTER — Ambulatory Visit (INDEPENDENT_AMBULATORY_CARE_PROVIDER_SITE_OTHER): Payer: Medicare Other | Admitting: Clinical

## 2022-06-26 VITALS — BP 128/74 | HR 66 | Ht 63.78 in | Wt 219.0 lb

## 2022-06-26 DIAGNOSIS — Z853 Personal history of malignant neoplasm of breast: Secondary | ICD-10-CM | POA: Diagnosis not present

## 2022-06-26 DIAGNOSIS — Z8742 Personal history of other diseases of the female genital tract: Secondary | ICD-10-CM

## 2022-06-26 DIAGNOSIS — Z114 Encounter for screening for human immunodeficiency virus [HIV]: Secondary | ICD-10-CM

## 2022-06-26 DIAGNOSIS — F332 Major depressive disorder, recurrent severe without psychotic features: Secondary | ICD-10-CM | POA: Diagnosis not present

## 2022-06-26 DIAGNOSIS — Z01419 Encounter for gynecological examination (general) (routine) without abnormal findings: Secondary | ICD-10-CM

## 2022-06-26 DIAGNOSIS — Z113 Encounter for screening for infections with a predominantly sexual mode of transmission: Secondary | ICD-10-CM | POA: Diagnosis not present

## 2022-06-26 DIAGNOSIS — Z9189 Other specified personal risk factors, not elsewhere classified: Secondary | ICD-10-CM | POA: Diagnosis not present

## 2022-06-26 DIAGNOSIS — L290 Pruritus ani: Secondary | ICD-10-CM

## 2022-06-26 NOTE — Patient Instructions (Signed)

## 2022-06-27 LAB — RPR: RPR Ser Ql: NONREACTIVE

## 2022-06-27 LAB — HSV(HERPES SIMPLEX VRS) I + II AB-IGG
HAV 1 IGG,TYPE SPECIFIC AB: <0.9 index
HSV 2 IGG,TYPE SPECIFIC AB: <0.9 index

## 2022-06-27 LAB — SURESWAB CT/NG/T. VAGINALIS
C. trachomatis RNA, TMA: NOT DETECTED
N. gonorrhoeae RNA, TMA: NOT DETECTED
Trichomonas vaginalis RNA: NOT DETECTED

## 2022-06-27 LAB — HIV ANTIBODY (ROUTINE TESTING W REFLEX): HIV 1&2 Ab, 4th Generation: NONREACTIVE

## 2022-06-27 LAB — HEPATITIS C ANTIBODY: Hepatitis C Ab: NONREACTIVE
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
## 2022-07-02 ENCOUNTER — Ambulatory Visit: Payer: Medicare Other | Admitting: Obstetrics and Gynecology

## 2022-07-04 ENCOUNTER — Ambulatory Visit (HOSPITAL_COMMUNITY): Payer: Medicare Other | Admitting: Clinical

## 2022-07-17 ENCOUNTER — Encounter (HOSPITAL_COMMUNITY): Payer: Self-pay

## 2022-07-17 ENCOUNTER — Ambulatory Visit (INDEPENDENT_AMBULATORY_CARE_PROVIDER_SITE_OTHER): Payer: Medicare Other | Admitting: Clinical

## 2022-07-17 DIAGNOSIS — F332 Major depressive disorder, recurrent severe without psychotic features: Secondary | ICD-10-CM

## 2022-07-18 ENCOUNTER — Encounter (HOSPITAL_COMMUNITY): Payer: Self-pay | Admitting: Student in an Organized Health Care Education/Training Program

## 2022-07-18 ENCOUNTER — Encounter (HOSPITAL_COMMUNITY): Payer: Self-pay

## 2022-07-18 ENCOUNTER — Telehealth (INDEPENDENT_AMBULATORY_CARE_PROVIDER_SITE_OTHER): Payer: Medicare Other | Admitting: Student in an Organized Health Care Education/Training Program

## 2022-07-18 DIAGNOSIS — F411 Generalized anxiety disorder: Secondary | ICD-10-CM | POA: Diagnosis not present

## 2022-07-18 DIAGNOSIS — F410 Panic disorder [episodic paroxysmal anxiety] without agoraphobia: Secondary | ICD-10-CM | POA: Diagnosis not present

## 2022-07-18 DIAGNOSIS — F331 Major depressive disorder, recurrent, moderate: Secondary | ICD-10-CM

## 2022-07-18 MED ORDER — HYDROXYZINE HCL 25 MG PO TABS: ORAL_TABLET | ORAL | 3 refills | Status: DC

## 2022-07-18 MED ORDER — TRAZODONE HCL 50 MG PO TABS: ORAL_TABLET | ORAL | 3 refills | Status: DC

## 2022-07-18 MED ORDER — MIRTAZAPINE 15 MG PO TABS: 7.5000 mg | ORAL_TABLET | Freq: Every day | ORAL | 3 refills | Status: DC

## 2022-07-18 MED ORDER — DULOXETINE HCL 30 MG PO CPEP: 90.0000 mg | ORAL_CAPSULE | Freq: Every day | ORAL | 2 refills | Status: DC
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
## 2022-07-19 NOTE — Progress Notes (Signed)
   THERAPIST PROGRESS NOTE Virtual Visit via Video Note  I connected with [name] on 07/18/2022 at  4:00 PM EST by a video enabled telemedicine application and verified that I am speaking with the correct person using two identifiers.  Location: Patient: home Provider: office   I discussed the limitations of evaluation and management by telemedicine and the availability of in person appointments. The patient expressed understanding and agreed to proceed.   Follow Up Instructions: I discussed the assessment and treatment plan with the patient. The patient was provided an opportunity to ask questions and all were answered. The patient agreed with the plan and demonstrated an understanding of the instructions.   The patient was advised to call back or seek an in-person evaluation if the symptoms worsen or if the condition fails to improve as anticipated.   Session Time: 25 minutes  Participation Level: Active  Behavioral Response: CasualAlertDepressed  Type of Therapy: Individual Therapy  Treatment Goals addressed: client will practice behavioral behavioral activation skills 4 times per week for the next 12 weeks  ProgressTowards Goals: Progressing  Interventions: CBT  Summary:  [name] is a 47 y.o. female who presents for the scheduled appointment oriented times five, appropriately dressed, and friendly. Client denied hallucinations and delusions. Client reported on today she is not feeling well physically. Client reported she has ben talking with her doctors to help manage pain and discomfort. Client reported her doctor is putting her on prednisone which helps some. Client reported she does not want to be on it long term because it causes weight gain. Client reported daily she tries to make the pain something that's in the background. Client reported it's hard to distract her focus from her pain. Client reported she will probably look into switching  rheumatologist. Client reported she was not feeling very talkative today but decided to keep the appointment. Evidence of progress towards goal:  client reported 1 positive factor for motivation which is looking forward to her grandchildren.   Suicidal/Homicidal: Nowithout intent/plan  Therapist Response:  Therapist began the appointment asking the client how she has been doing since last seen. Therapist used CBT to engage using active listening and positive emotional support. Therapist used CBT to ask the client open ended questions about her thoughts and feelings. Therapist used CBT to engage and normalize the clients thoughts and emotions within reason. Therapist used CBT to engage and discuss mindfulness coping skills. Therapist used CBT ask the client to identify her progress with frequency of use with coping skills with continued practice in her daily activity.    Therapist assigned the client homework to practice self care. Client was scheduled for next appointment.    Plan: Return again in 3 weeks.  Diagnosis: severe episode of recurrent major depressive disorder,without psychotic features  Collaboration of Care: Patient refused AEB none requested by the client.  Patient/Guardian was advised Release of Information must be obtained prior to any record release in order to collaborate their care with an outside provider. Patient/Guardian was advised if they have not already done so to contact the registration department to sign all necessary forms in order for [place] to release information regarding their care.   Consent: Patient/Guardian gives verbal consent for treatment and assignment of benefits for services provided during this visit. Patient/Guardian expressed understanding and agreed to proceed.   [place], LCSW 07/17/2022

## 2022-07-26 IMAGING — CT CT ABD-PELV W/ CM
2 of 5 series · 15 of 46 positions shown, 17 images · IV contrast (OMNIPAQUE)
Comparison: Abdominopelvic CT 06/29/2004. Chest CT 12/27/2019 and
10/16/2020.

CLINICAL DATA: Abdominal pain with loose stools/diarrhea. History
of breast cancer diagnosed in 0151. Chemotherapy and radiation
therapy completed.

EXAM:
CT ABDOMEN AND PELVIS WITH CONTRAST
TECHNIQUE: Multidetector CT imaging of the abdomen and pelvis was performed
using the standard protocol following bolus administration of
intravenous contrast.
CONTRAST:  75mL OMNIPAQUE IOHEXOL 350 MG/ML SOLN

[Series 2: axial st · axial · 0.70mm/px · z∈[-484,-98]mm · 12 of 93 slices shown, 14 images]
[im 8/93  soft-tissue]
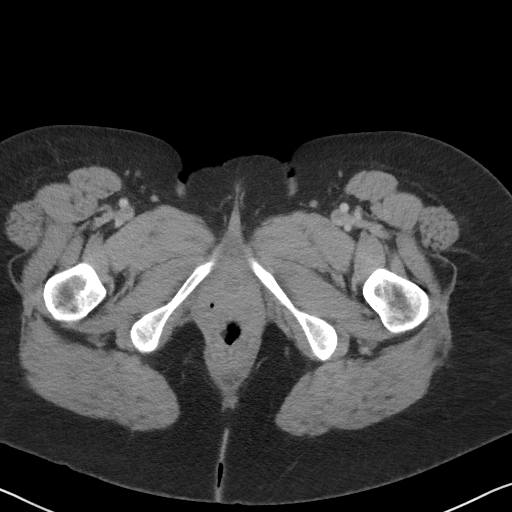
[im 8/93  bone]
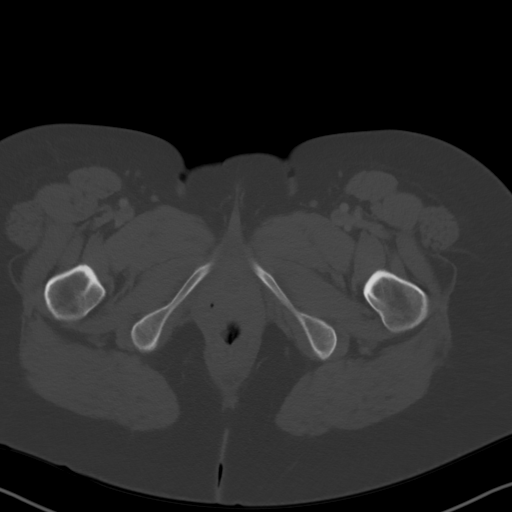
[im 15/93  soft-tissue]
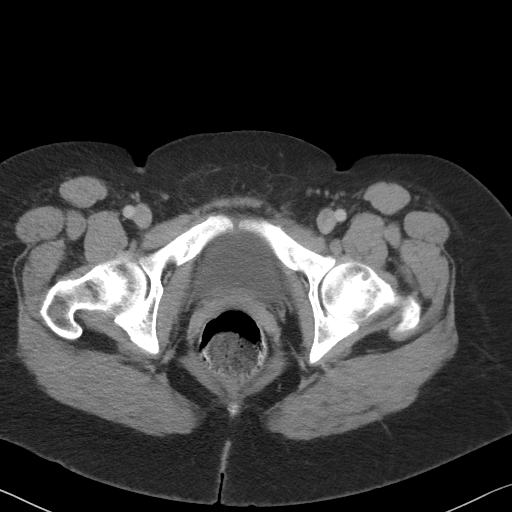
[im 22/93  soft-tissue]
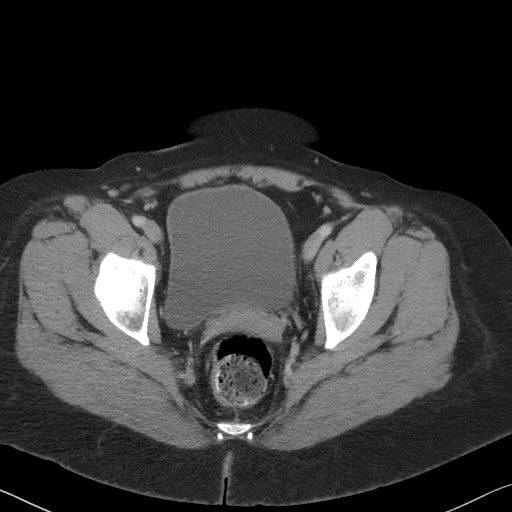
[im 29/93  soft-tissue]
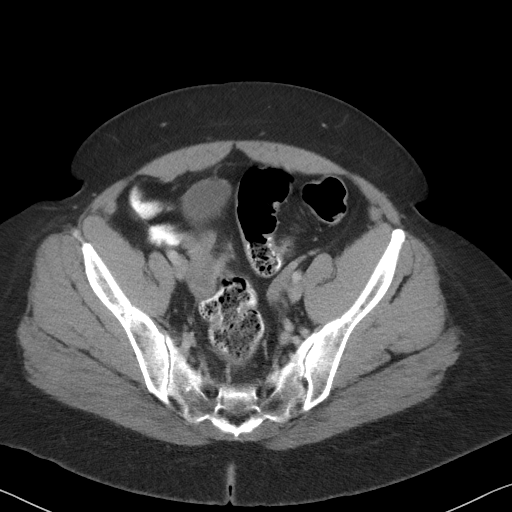
[im 36/93  soft-tissue]
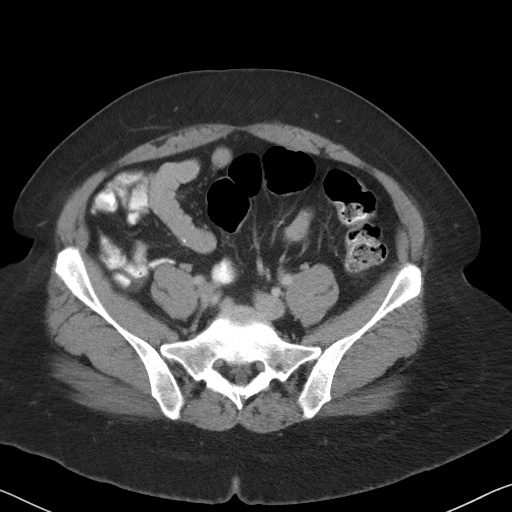
[im 43/93  soft-tissue]
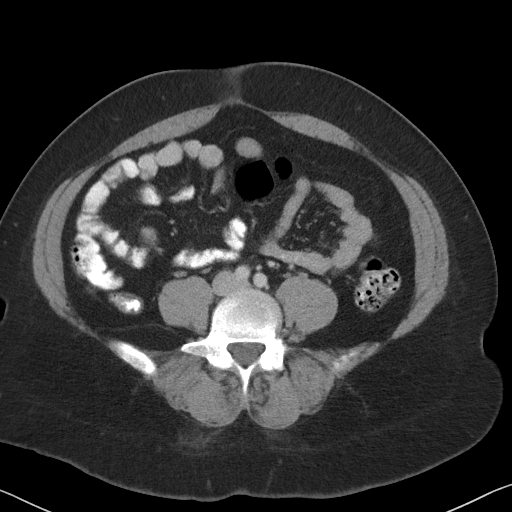
[im 50/93  soft-tissue]
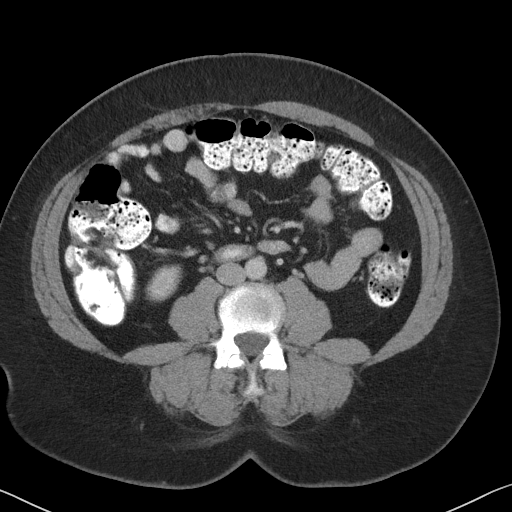
[im 57/93  soft-tissue]
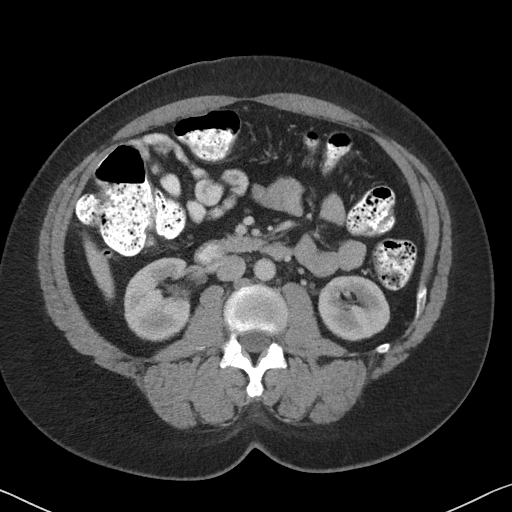
[im 64/93  soft-tissue]
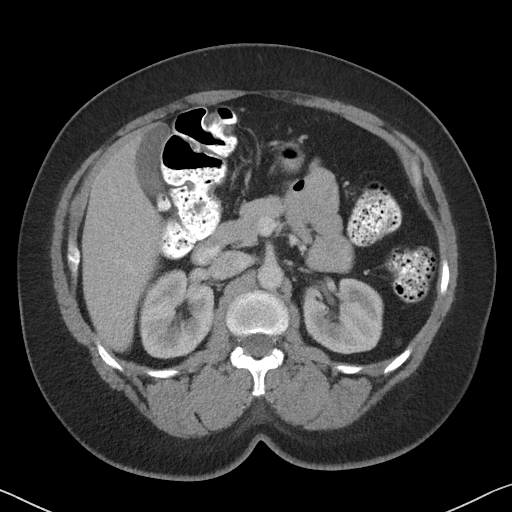
[im 64/93  bone]
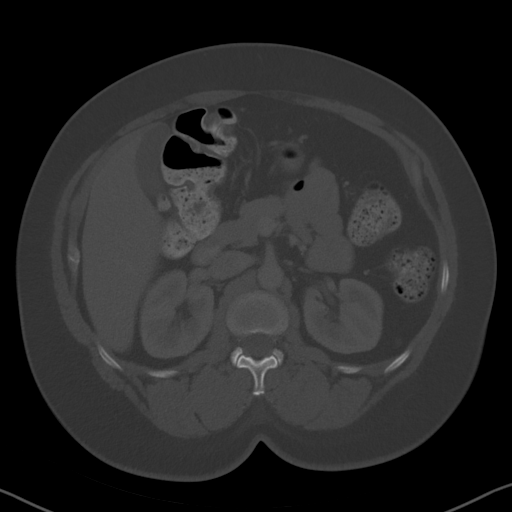
[im 71/93  soft-tissue]
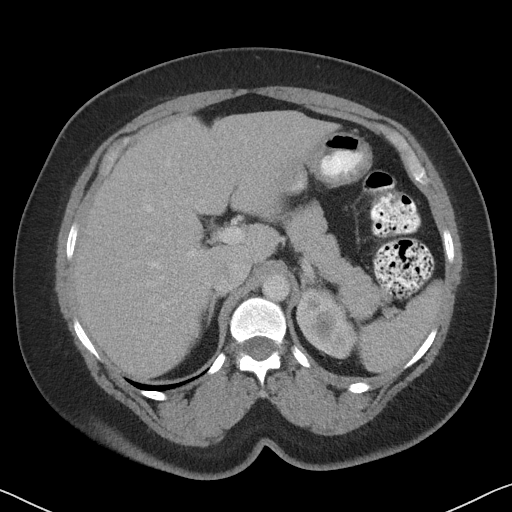
[im 78/93  soft-tissue]
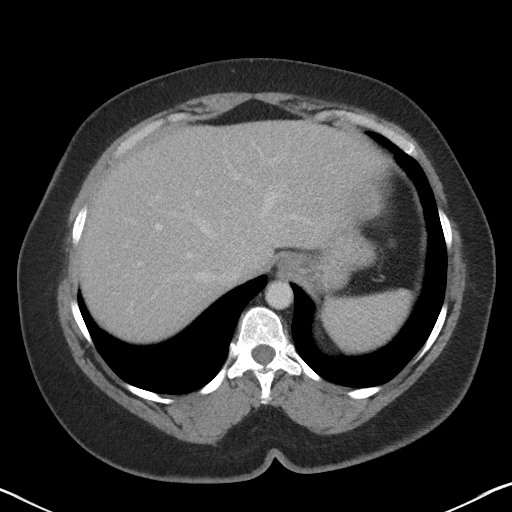
[im 85/93  soft-tissue]
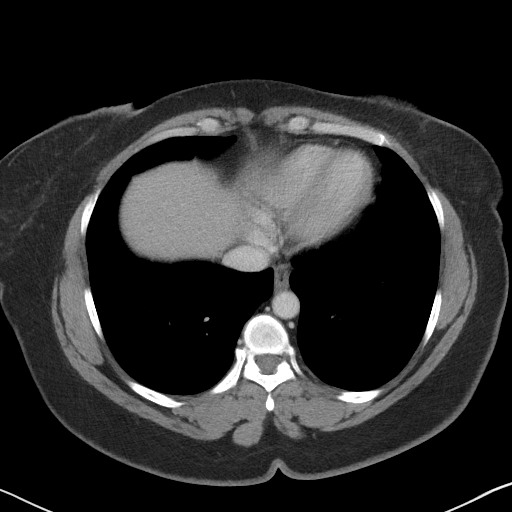

[Series 4: coronal st · coronal · 0.75mm/px · 3 of 79 slices shown]
[im 27/79  soft-tissue]
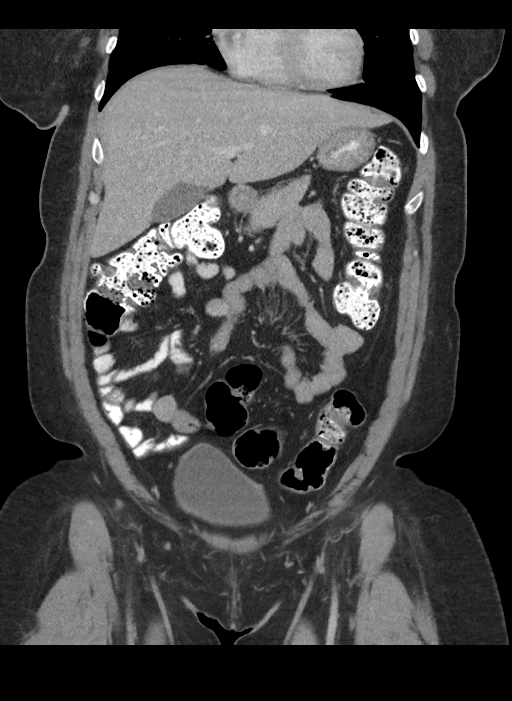
[im 35/79  soft-tissue]
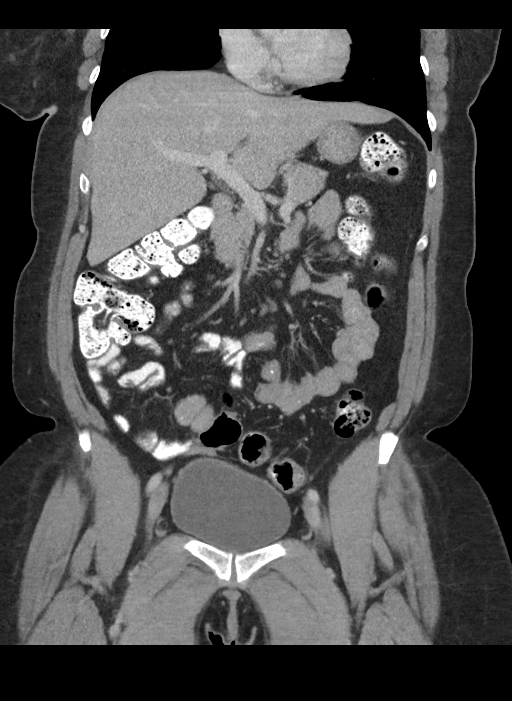
[im 44/79  soft-tissue]
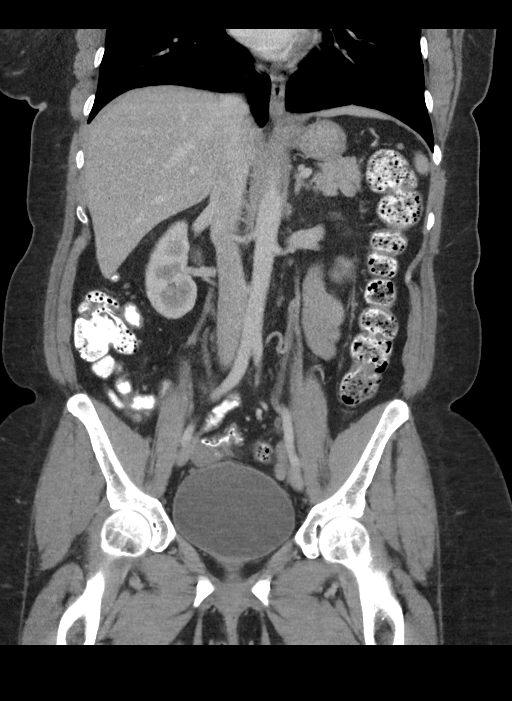

[15 of 46 positions shown; findings below may reference images not displayed]

FINDINGS: Lower chest: Clear lung bases. No significant pleural or pericardial
effusion.

Hepatobiliary: The liver is normal in density without suspicious
focal abnormality. No evidence of gallstones, gallbladder wall
thickening or biliary dilatation.

Pancreas: Unremarkable. No pancreatic ductal dilatation or
surrounding inflammatory changes.

Spleen: Normal in size without focal abnormality.

Adrenals/Urinary Tract: Both adrenal glands appear normal. Solitary
nonobstructing 7 mm calculus in the lower pole of the right kidney.
No evidence of ureteral calculus or hydronephrosis. There is no
evidence of renal mass. The bladder appears unremarkable.

Stomach/Bowel: Enteric contrast was administered and has passed into
the distal colon. The stomach appears unremarkable for its degree of
distension. No evidence of bowel wall thickening, distention or
surrounding inflammatory change. The appendix appears normal. There
is mildly prominent stool throughout the colon.

Vascular/Lymphatic: There are no enlarged abdominal or pelvic lymph
nodes. No significant vascular findings.

Reproductive: Status post interval partial hysterectomy. There is
residual ovarian tissue bilaterally. No suspicious adnexal findings.

Other: No evidence of abdominal wall mass or hernia. No ascites.

Musculoskeletal: No acute or significant osseous findings. 12 mm
sclerotic lesion in the right aspect of the L1 vertebral body is
unchanged from the recent prior studies and is likely a bone island.
This is visible on lumbar spine radiographs 08/11/2016.
IMPRESSION: 1. No acute findings or explanation for the patient's symptoms.
Mildly prominent stool throughout the colon, suggesting
constipation.
2. No evidence of metastatic disease.
3. Nonobstructing calculus in the lower pole of the right kidney.

## 2022-07-27 IMAGING — MG MM DIGITAL SCREENING BILAT W/ TOMO AND CAD
6 of 10 series · 6 of 30 positions shown · non-contrast
Comparison: Previous exam(s).

CLINICAL DATA: Screening. History of treated left breast cancer,
status post breast conservation therapy.

EXAM:
DIGITAL SCREENING BILATERAL MAMMOGRAM WITH TOMOSYNTHESIS AND CAD
TECHNIQUE: Bilateral screening digital craniocaudal and mediolateral oblique
mammograms were obtained. Bilateral screening digital breast
tomosynthesis was performed. The images were evaluated with
computer-aided detection.

[L CC synth-2D]
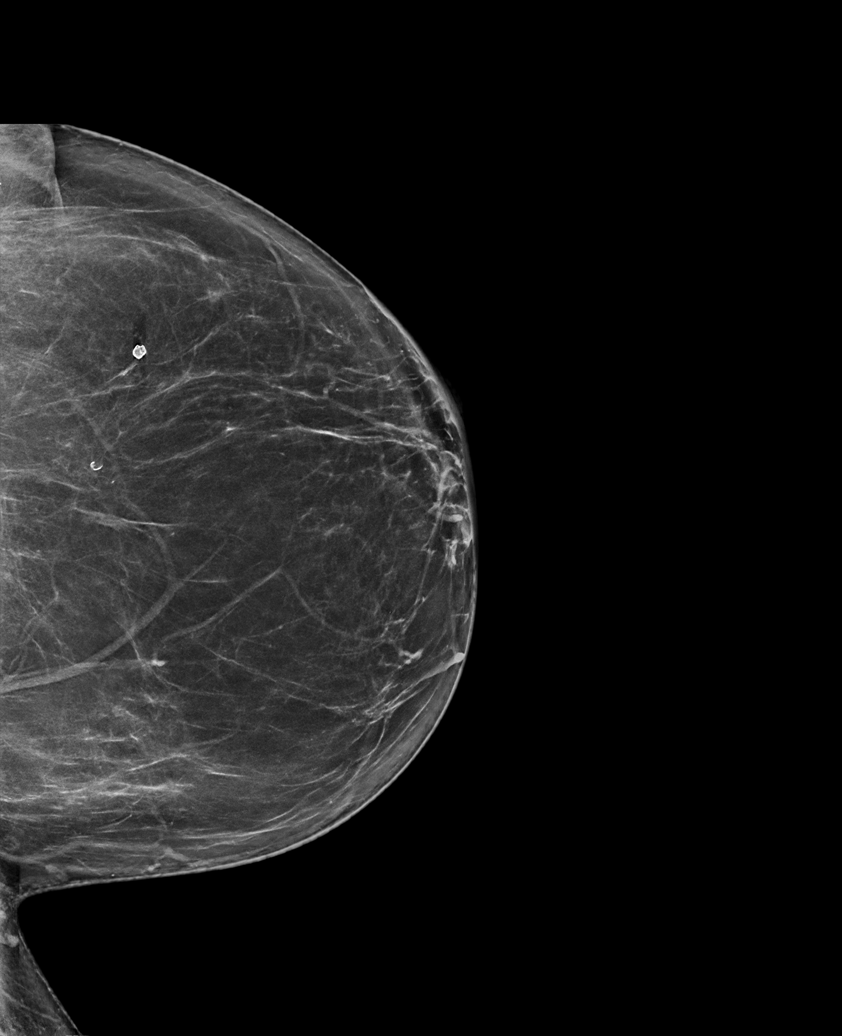

[L XCCL synth-2D]
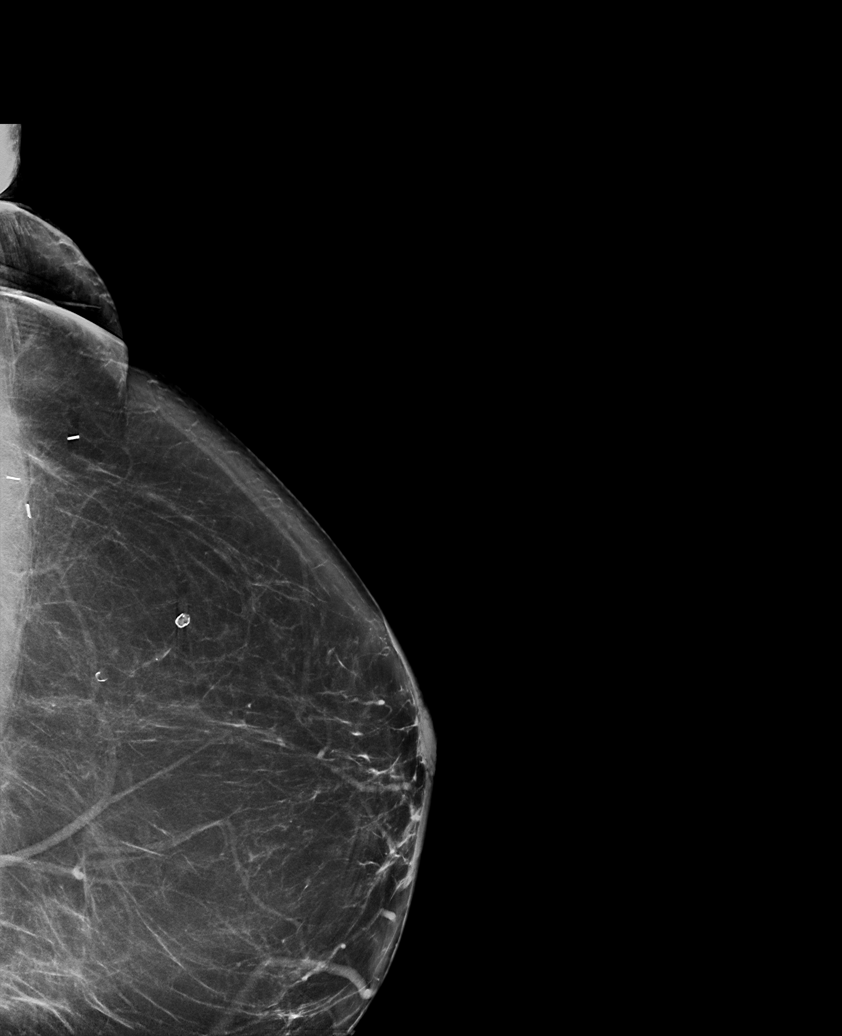

[R CC synth-2D]
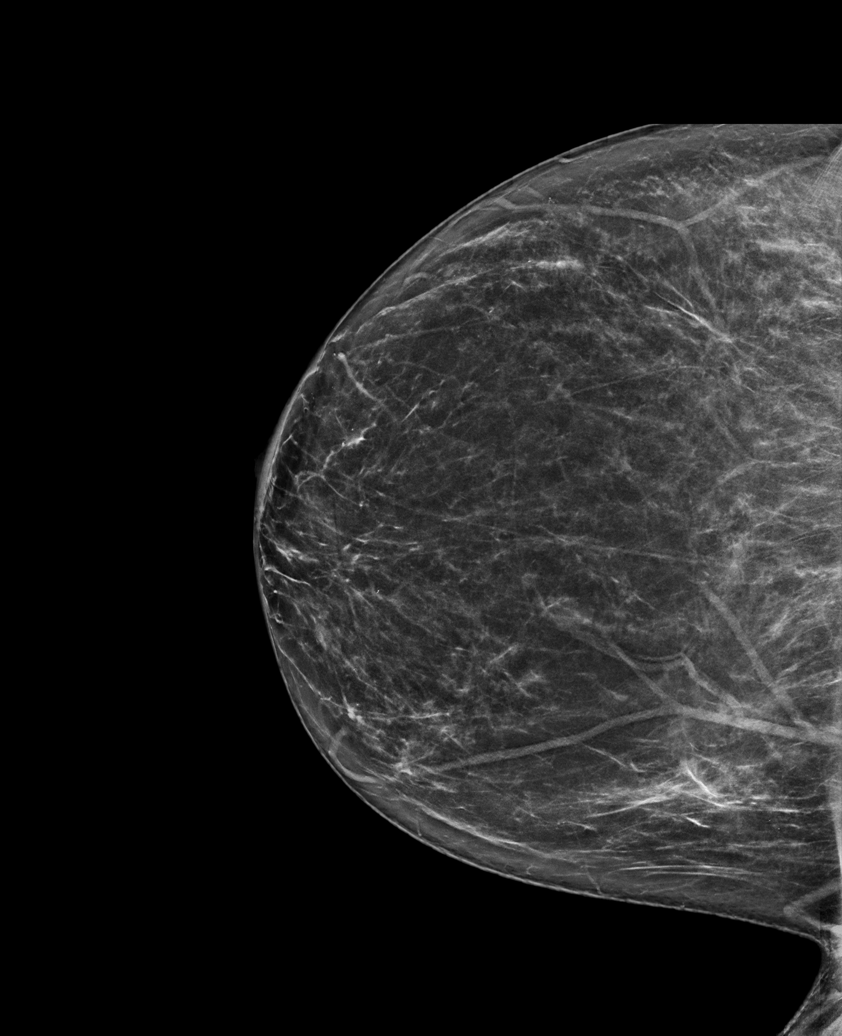

[L MLO synth-2D]
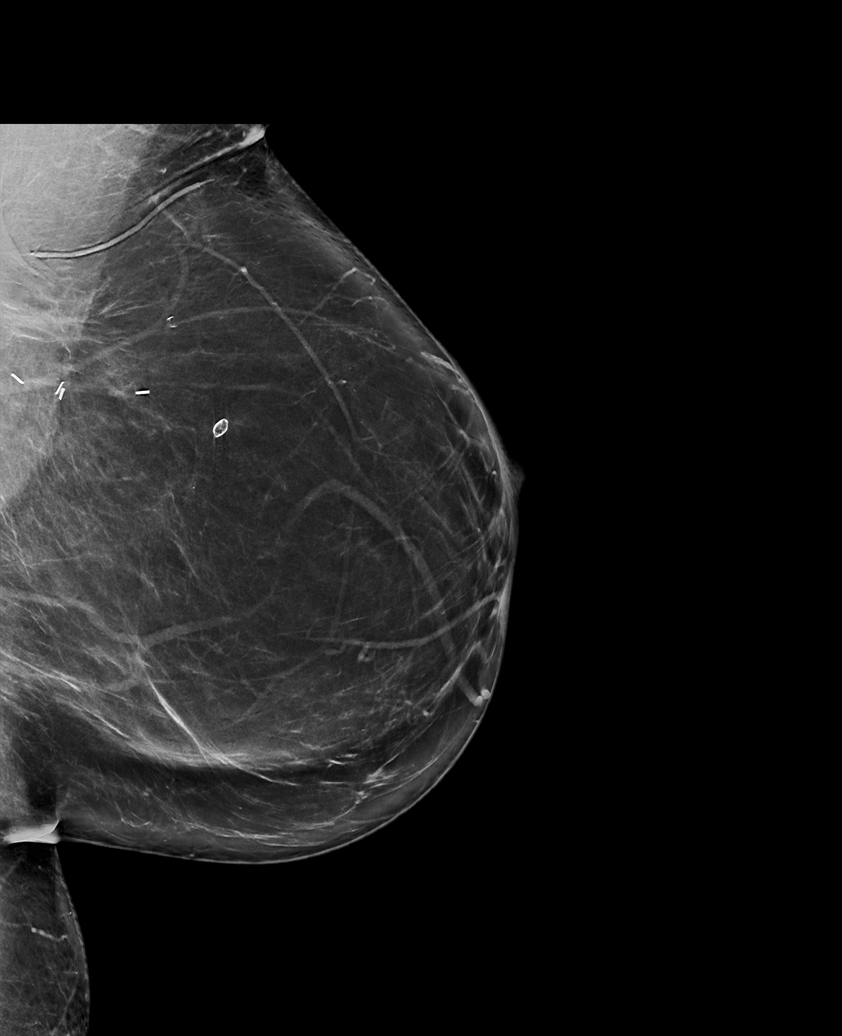

[R MLO synth-2D]
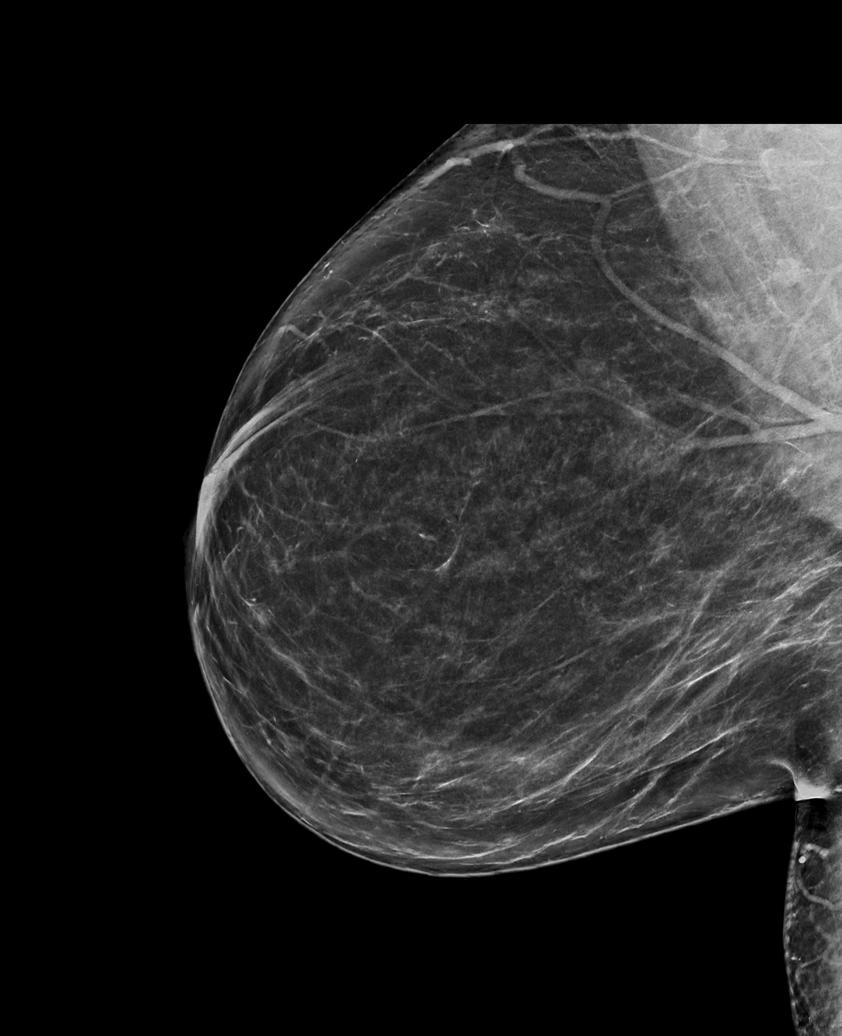

[L CC tomo · tomo slice 39/76.0]
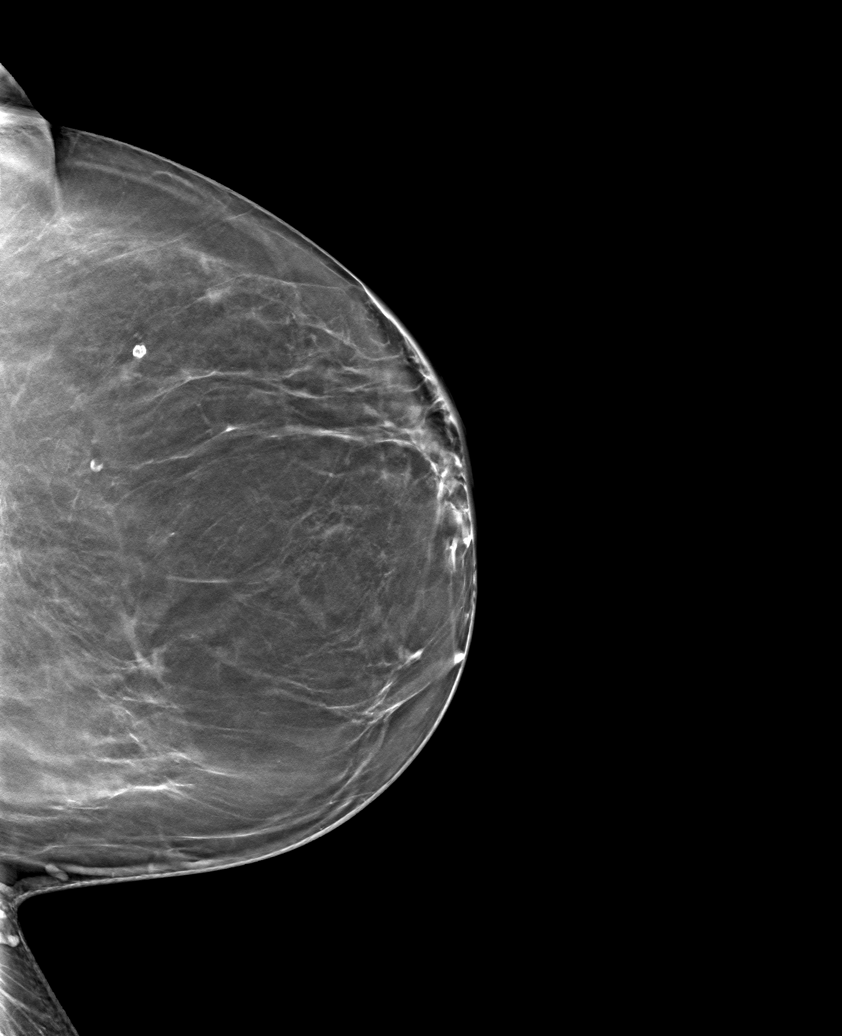

[6 of 30 positions shown; findings below may reference images not displayed]

ACR Breast Density Category b: There are scattered areas of
fibroglandular density.
FINDINGS: There are no findings suspicious for malignancy.
IMPRESSION: No mammographic evidence of malignancy. A result letter of this
screening mammogram will be mailed directly to the patient.

RECOMMENDATION:
Screening mammogram in one year. (Code:6H-H-NPN)

BI-RADS CATEGORY  1: Negative.

## 2022-07-28 ENCOUNTER — Ambulatory Visit: Payer: Medicare Other | Admitting: Neurology

## 2022-08-01 ENCOUNTER — Encounter
Payer: Medicare Other | Attending: Physical Medicine and Rehabilitation | Admitting: Physical Medicine and Rehabilitation

## 2022-08-01 ENCOUNTER — Encounter: Payer: Self-pay | Admitting: Physical Medicine and Rehabilitation

## 2022-08-01 VITALS — BP 134/89 | HR 99 | Ht 64.0 in | Wt 218.0 lb

## 2022-08-01 DIAGNOSIS — M7918 Myalgia, other site: Secondary | ICD-10-CM | POA: Diagnosis present

## 2022-08-01 DIAGNOSIS — G43711 Chronic migraine without aura, intractable, with status migrainosus: Secondary | ICD-10-CM | POA: Insufficient documentation

## 2022-08-01 DIAGNOSIS — M797 Fibromyalgia: Secondary | ICD-10-CM | POA: Insufficient documentation

## 2022-08-01 DIAGNOSIS — G894 Chronic pain syndrome: Secondary | ICD-10-CM | POA: Diagnosis present

## 2022-08-01 DIAGNOSIS — F332 Major depressive disorder, recurrent severe without psychotic features: Secondary | ICD-10-CM | POA: Diagnosis present

## 2022-08-01 MED ORDER — AIMOVIG 70 MG/ML ~~LOC~~ SOAJ: 70.0000 mg | SUBCUTANEOUS | 5 refills | Status: DC

## 2022-08-01 MED ORDER — LIDOCAINE 5 % EX OINT: 1.0000 | TOPICAL_OINTMENT | Freq: Four times a day (QID) | CUTANEOUS | 5 refills | Status: DC | PRN
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
## 2022-08-01 NOTE — Patient Instructions (Signed)
Patient is 47 yr old R handed female with hx of RA (dx'd 2019), breast CA- 2017 dx'd, and Fibromyalgia- secondary to breast cancer TREATMENT-- Sx's since 2019; and here for f/u of L>>>R Thoracic back pain.  Lidocaine 5% ointment  for injection site put on thick for 15-30 minutes, then clean off and then see if can tolerate Aimovig  2. Restart Aimovig 70 mg q 30 days- has failed B blocker- BP too low, Topamax (causes suicide ideation) and Calcium channel blocker- has been successful on Aimovig in past.  Qday to QOD migraines  3.  Stick with the winners!- One day at a time.   4. NAMI for local support group- for mental health disorders.    5. Naltrexone- start again with 3 mg nightly- x 1 week, then 6 mg nightly for Fibromyalgia pain and nerve pain.  Called in refill.    6. Scared of needles- doesn't want trigger point injections.   7. Theracane- or [employer] Mus[employer] Hook- [employer]- hold pressure 2-4 minutes- don't massage- on muscles where is tight.   8. Feels like ribs "pop out of place"- put in referral to Dr Loyal Jacobson- for OMT- osteopathic manipulation. For help with rib pain.  Covered by insurance.    9. F/U in 2-3 months on chronic pain/migraines

## 2022-09-25 ENCOUNTER — Ambulatory Visit: Payer: Medicare Other | Admitting: Internal Medicine

## 2022-10-10 ENCOUNTER — Ambulatory Visit: Payer: Medicare Other | Admitting: Physical Medicine and Rehabilitation

## 2022-10-20 ENCOUNTER — Encounter (HOSPITAL_COMMUNITY): Payer: Self-pay

## 2022-10-20 ENCOUNTER — Ambulatory Visit (INDEPENDENT_AMBULATORY_CARE_PROVIDER_SITE_OTHER): Payer: Medicare Other | Admitting: Family

## 2022-10-20 ENCOUNTER — Telehealth (HOSPITAL_COMMUNITY): Payer: Medicare Other | Admitting: Student in an Organized Health Care Education/Training Program

## 2022-10-20 VITALS — BP 116/86 | HR 89 | Temp 97.9°F | Resp 16 | Ht 65.35 in | Wt 218.6 lb

## 2022-10-20 DIAGNOSIS — Z0000 Encounter for general adult medical examination without abnormal findings: Secondary | ICD-10-CM

## 2022-10-20 NOTE — Patient Instructions (Addendum)
[name] , Thank you for taking time to come for your Medicare Wellness Visit. I appreciate your ongoing commitment to your health goals. Please review the following plan we discussed and let me know if I can assist you in the future.   Screening recommendations/referrals: Colonoscopy : Up to date  Mammogram  : Up to date  Bone Density : N/A  Recommended yearly ophthalmology/optometry visit for glaucoma screening and checkup Recommended yearly dental visit for hygiene and checkup  Vaccinations: Influenza vaccine- due annually in September/October Pneumococcal vaccine : N/A  Tdap vaccine  : Up to date  Shingles vaccine : N/A     Advanced directives: Yes   Conditions/risks identified: obesity (BMI >30kg/m2)  Next appointment: 1 year    Preventive Care 49 Years and Older, Female Preventive care refers to lifestyle choices and visits with your health care provider that can promote health and wellness. What does preventive care include? A yearly physical exam. This is also called an annual well check. Dental exams once or twice a year. Routine eye exams. Ask your health care provider how often you should have your eyes checked. Personal lifestyle choices, including: Daily care of your teeth and gums. Regular physical activity. Eating a healthy diet. Avoiding tobacco and drug use. Limiting alcohol use. Practicing safe sex. Taking low-dose aspirin every day. Taking vitamin and mineral supplements as recommended by your health care provider. What happens during an annual well check? The services and screenings done by your health care provider during your annual well check will depend on your age, overall health, lifestyle risk factors, and family history of disease. Counseling  Your health care provider may ask you questions about your: Alcohol use. Tobacco use. Drug use. Emotional well-being. Home and relationship well-being. Sexual activity. Eating habits. History of  falls. Memory and ability to understand (cognition). Work and work Astronomer. Reproductive health. Screening  You may have the following tests or measurements: Height, weight, and BMI. Blood pressure. Lipid and cholesterol levels. These may be checked every 5 years, or more frequently if you are over 78 years old. Skin check. Lung cancer screening. You may have this screening every year starting at age 25 if you have a 30-pack-year history of smoking and currently smoke or have quit within the past 15 years. Fecal occult blood test (FOBT) of the stool. You may have this test every year starting at age 38. Flexible sigmoidoscopy or colonoscopy. You may have a sigmoidoscopy every 5 years or a colonoscopy every 10 years starting at age 42. Hepatitis C blood test. Hepatitis B blood test. Sexually transmitted disease (STD) testing. Diabetes screening. This is done by checking your blood sugar (glucose) after you have not eaten for a while (fasting). You may have this done every 1-3 years. Bone density scan. This is done to screen for osteoporosis. You may have this done starting at age 50. Mammogram. This may be done every 1-2 years. Talk to your health care provider about how often you should have regular mammograms. Talk with your health care provider about your test results, treatment options, and if necessary, the need for more tests. Vaccines  Your health care provider may recommend certain vaccines, such as: Influenza vaccine. This is recommended every year. Tetanus, diphtheria, and acellular pertussis (Tdap, Td) vaccine. You may need a Td booster every 10 years. Zoster vaccine. You may need this after age 38. Pneumococcal 13-valent conjugate (PCV13) vaccine. One dose is recommended after age 38. Pneumococcal polysaccharide (PPSV23) vaccine. One dose  is recommended after age 20. Talk to your health care provider about which screenings and vaccines you need and how often you need  them. This information is not intended to replace advice given to you by your health care provider. Make sure you discuss any questions you have with your health care provider. Document Released: 07/20/2015 Document Revised: 03/12/2016 Document Reviewed: 04/24/2015 Elsevier Interactive Patient Education  2017 [employer].  Fall Prevention in the Home Falls can cause injuries. They can happen to people of all ages. There are many things you can do to make your home safe and to help prevent falls. What can I do on the outside of my home? Regularly fix the edges of walkways and driveways and fix any cracks. Remove anything that might make you trip as you walk through a door, such as a raised step or threshold. Trim any bushes or trees on the path to your home. Use bright outdoor lighting. Clear any walking paths of anything that might make someone trip, such as rocks or tools. Regularly check to see if handrails are loose or broken. Make sure that both sides of any steps have handrails. Any raised decks and porches should have guardrails on the edges. Have any leaves, snow, or ice cleared regularly. Use sand or salt on walking paths during winter. Clean up any spills in your garage right away. This includes oil or grease spills. What can I do in the bathroom? Use night lights. Install grab bars by the toilet and in the tub and shower. Do not use towel bars as grab bars. Use non-skid mats or decals in the tub or shower. If you need to sit down in the shower, use a plastic, non-slip stool. Keep the floor dry. Clean up any water that spills on the floor as soon as it happens. Remove soap buildup in the tub or shower regularly. Attach bath mats securely with double-sided non-slip rug tape. Do not have throw rugs and other things on the floor that can make you trip. What can I do in the bedroom? Use night lights. Make sure that you have a light by your bed that is easy to reach. Do not use  any sheets or blankets that are too big for your bed. They should not hang down onto the floor. Have a firm chair that has side arms. You can use this for support while you get dressed. Do not have throw rugs and other things on the floor that can make you trip. What can I do in the kitchen? Clean up any spills right away. Avoid walking on wet floors. Keep items that you use a lot in easy-to-reach places. If you need to reach something above you, use a strong step stool that has a grab bar. Keep electrical cords out of the way. Do not use floor polish or wax that makes floors slippery. If you must use wax, use non-skid floor wax. Do not have throw rugs and other things on the floor that can make you trip. What can I do with my stairs? Do not leave any items on the stairs. Make sure that there are handrails on both sides of the stairs and use them. Fix handrails that are broken or loose. Make sure that handrails are as long as the stairways. Check any carpeting to make sure that it is firmly attached to the stairs. Fix any carpet that is loose or worn. Avoid having throw rugs at the top or bottom of the stairs.  If you do have throw rugs, attach them to the floor with carpet tape. Make sure that you have a light switch at the top of the stairs and the bottom of the stairs. If you do not have them, ask someone to add them for you. What else can I do to help prevent falls? Wear shoes that: Do not have high heels. Have rubber bottoms. Are comfortable and fit you well. Are closed at the toe. Do not wear sandals. If you use a stepladder: Make sure that it is fully opened. Do not climb a closed stepladder. Make sure that both sides of the stepladder are locked into place. Ask someone to hold it for you, if possible. Clearly mark and make sure that you can see: Any grab bars or handrails. First and last steps. Where the edge of each step is. Use tools that help you move around (mobility aids)  if they are needed. These include: Canes. Walkers. Scooters. Crutches. Turn on the lights when you go into a dark area. Replace any light bulbs as soon as they burn out. Set up your furniture so you have a clear path. Avoid moving your furniture around. If any of your floors are uneven, fix them. If there are any pets around you, be aware of where they are. Review your medicines with your doctor. Some medicines can make you feel dizzy. This can increase your chance of falling. Ask your doctor what other things that you can do to help prevent falls. This information is not intended to replace advice given to you by your health care provider. Make sure you discuss any questions you have with your health care provider. Document Released: 04/19/2009 Document Revised: 11/29/2015 Document Reviewed: 07/28/2014 Elsevier Interactive Patient Education  2017 [employer].
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
## 2022-10-27 ENCOUNTER — Encounter: Payer: Medicare Other | Admitting: Obstetrics and Gynecology

## 2022-11-03 ENCOUNTER — Telehealth: Payer: Self-pay | Admitting: *Deleted

## 2022-11-03 ENCOUNTER — Encounter: Payer: Medicare Other | Admitting: Physical Medicine and Rehabilitation

## 2022-11-03 DIAGNOSIS — R7303 Prediabetes: Secondary | ICD-10-CM

## 2022-11-03 DIAGNOSIS — M797 Fibromyalgia: Secondary | ICD-10-CM

## 2022-11-03 DIAGNOSIS — K219 Gastro-esophageal reflux disease without esophagitis: Secondary | ICD-10-CM

## 2022-11-03 NOTE — Telephone Encounter (Signed)
Patient has an appointment 11/07/2022 for Fasting Labs but there are no orders in Epic.   Please Place orders for patient's upcoming appointment.

## 2022-11-05 NOTE — Telephone Encounter (Signed)
Labs Released.  

## 2022-11-07 ENCOUNTER — Other Ambulatory Visit: Payer: Medicare Other

## 2022-11-07 DIAGNOSIS — K219 Gastro-esophageal reflux disease without esophagitis: Secondary | ICD-10-CM

## 2022-11-07 DIAGNOSIS — M797 Fibromyalgia: Secondary | ICD-10-CM

## 2022-11-07 DIAGNOSIS — R7303 Prediabetes: Secondary | ICD-10-CM

## 2022-11-07 LAB — CBC WITH DIFFERENTIAL/PLATELET
Absolute Monocytes: 680 cells/uL (ref 200–950)
Basophils Absolute: 51 cells/uL (ref 0–200)
HCT: 39 % (ref 35.0–45.0)
Hemoglobin: 13 g/dL (ref 11.7–15.5)
MCH: 29.3 pg (ref 27.0–33.0)
MCV: 87.8 fL (ref 80.0–100.0)

## 2022-11-08 LAB — CBC WITH DIFFERENTIAL/PLATELET
Basophils Relative: 0.6 %
Eosinophils Absolute: 289 cells/uL (ref 15–500)
Eosinophils Relative: 3.4 %
Lymphs Abs: 2839 cells/uL (ref 850–3900)
MCHC: 33.3 g/dL (ref 32.0–36.0)
MPV: 9.9 fL (ref 7.5–12.5)
Monocytes Relative: 8 %
Neutro Abs: 4641 cells/uL (ref 1500–7800)
Neutrophils Relative %: 54.6 %
Platelets: 369 10*3/uL (ref 140–400)
RBC: 4.44 10*6/uL (ref 3.80–5.10)
RDW: 13.1 % (ref 11.0–15.0)
Total Lymphocyte: 33.4 %
WBC: 8.5 10*3/uL (ref 3.8–10.8)

## 2022-11-08 LAB — COMPLETE METABOLIC PANEL WITH GFR
AG Ratio: 1.5 (calc) (ref 1.0–2.5)
ALT: 21 U/L (ref 6–29)
AST: 21 U/L (ref 10–35)
Albumin: 4.4 g/dL (ref 3.6–5.1)
Alkaline phosphatase (APISO): 83 U/L (ref 31–125)
BUN: 10 mg/dL (ref 7–25)
CO2: 23 mmol/L (ref 20–32)
Calcium: 9.6 mg/dL (ref 8.6–10.2)
Chloride: 103 mmol/L (ref 98–110)
Creat: 0.79 mg/dL (ref 0.50–0.99)
Globulin: 3 g/dL (calc) (ref 1.9–3.7)
Glucose, Bld: 96 mg/dL (ref 65–99)
Potassium: 3.9 mmol/L (ref 3.5–5.3)
Sodium: 136 mmol/L (ref 135–146)
Total Bilirubin: 0.3 mg/dL (ref 0.2–1.2)
Total Protein: 7.4 g/dL (ref 6.1–8.1)
eGFR: 92 mL/min/{1.73_m2} (ref >=60)

## 2022-11-08 LAB — HEMOGLOBIN A1C
Hgb A1c MFr Bld: 5.9 % of total Hgb — ABNORMAL HIGH (ref <5.7)
Mean Plasma Glucose: 123 mg/dL
eAG (mmol/L): 6.8 mmol/L

## 2022-11-08 LAB — TSH: TSH: 5.19 mIU/L — ABNORMAL HIGH

## 2022-11-25 ENCOUNTER — Other Ambulatory Visit: Payer: Self-pay | Admitting: Neurology
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
## 2022-11-26 ENCOUNTER — Other Ambulatory Visit: Payer: Self-pay | Admitting: Neurology

## 2022-11-26 ENCOUNTER — Ambulatory Visit: Payer: Medicare Other | Admitting: Neurology

## 2022-11-26 ENCOUNTER — Ambulatory Visit: Payer: Medicare Other | Admitting: Obstetrics and Gynecology

## 2022-11-27 ENCOUNTER — Ambulatory Visit (INDEPENDENT_AMBULATORY_CARE_PROVIDER_SITE_OTHER): Payer: Medicare Other | Admitting: Obstetrics and Gynecology

## 2022-11-27 ENCOUNTER — Ambulatory Visit: Payer: Medicare Other | Admitting: Obstetrics and Gynecology

## 2022-11-27 ENCOUNTER — Encounter: Payer: Self-pay | Admitting: Obstetrics and Gynecology

## 2022-11-27 VITALS — BP 124/84 | HR 78 | Wt 223.0 lb

## 2022-11-27 DIAGNOSIS — K649 Unspecified hemorrhoids: Secondary | ICD-10-CM | POA: Diagnosis not present

## 2022-11-27 DIAGNOSIS — R232 Flushing: Secondary | ICD-10-CM

## 2022-11-27 DIAGNOSIS — L309 Dermatitis, unspecified: Secondary | ICD-10-CM | POA: Diagnosis not present

## 2022-11-27 DIAGNOSIS — K582 Mixed irritable bowel syndrome: Secondary | ICD-10-CM

## 2022-11-27 MED ORDER — HYDROCORTISONE ACETATE 25 MG RE SUPP
25.0000 mg | Freq: Two times a day (BID) | RECTAL | 1 refills | Status: AC | PRN
Start: 2022-11-27 — End: ?

## 2022-11-27 MED ORDER — CLOBETASOL PROPIONATE 0.05 % EX OINT
1.0000 | TOPICAL_OINTMENT | Freq: Two times a day (BID) | CUTANEOUS | 0 refills | Status: AC
Start: 2022-11-27 — End: ?
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
## 2022-11-27 NOTE — Patient Instructions (Signed)
Irritable Bowel Syndrome, Adult  Irritable bowel syndrome (IBS) is a group of symptoms that affects the organs responsible for digestion (gastrointestinal tract, or GI tract). IBS is not one specific disease. To regulate how the GI tract works, the body sends signals back and forth between the intestines and the brain. If you have IBS, there may be a problem with these signals. As a result, the GI tract does not function normally. The intestines may become more sensitive and overreact to certain things. This may be especially true when you eat certain foods or when you are under stress. There are four main types of IBS. These may be determined based on the consistency of your stool (feces): IBS with mostly (predominance of) diarrhea. IBS with predominance of constipation. IBS with mixed bowel habits. This includes both diarrhea and constipation. IBS unclassified. This includes IBS that cannot be categorized into one of the other three main types. It is important to know which type of IBS you have. Certain treatments are more likely to be helpful for certain types of IBS. What are the causes? The exact cause of IBS is not known. What increases the risk? You may have a higher risk for IBS if you: Are female. Are younger than 40 years. Have a family history of IBS. Have a mental health condition, such as depression, anxiety, or post-traumatic stress disorder. Have had a bacterial infection of your GI tract. What are the signs or symptoms? Symptoms of IBS vary from person to person. The main symptom is abdominal pain or discomfort. Other symptoms usually include one or more of the following: Diarrhea, constipation, or both. Swelling or bloating in the abdomen. Feeling full after eating a small or regular-sized meal. Frequent gas. Mucus in the stool. A feeling of having more stool left after a bowel movement. Symptoms tend to come and go. They may be triggered by stress, mental health  conditions, or certain foods. How is this diagnosed? This condition may be diagnosed based on a physical exam, your medical history, and your symptoms. You may have tests, such as: Blood tests. Stool test. Colonoscopy. This is a procedure in which your GI tract is viewed with a long, thin, flexible tube. How is this treated? There is no cure for IBS, but treatment can help relieve symptoms. Treatment depends on the type of IBS you have, and may include: Changes to your diet, such as: Avoiding foods that cause symptoms. Drinking more water. Following a low-FODMAP (fermentable oligosaccharides, disaccharides, monosaccharides, and polyols) diet for up to 6 weeks, or as told by your health care provider. FODMAPs are sugars that are hard for some people to digest. Eating more fiber. Eating small meals at the same times every day. Medicines. These may include: Fiber supplements, if you have constipation. Medicine to control diarrhea (antidiarrheal medicines). Medicine to help control muscle tightening (spasms) in your GI tract (antispasmodic medicines). Medicines to help with mental health conditions, such as antidepressants. Talk therapy or counseling. Working with a dietitian to help create a food plan that is right for you. Managing your stress. Follow these instructions at home: Eating and drinking  Eat a healthy diet. Eat 5-6 small meals a day. Try to eat meals at about the same times each day. Do not eat large meals. Gradually eat more fiber-rich foods. These include whole grains, fruits, and vegetables. This may be especially helpful if you have IBS with constipation. Eat a diet low in FODMAPs. You may need to avoid foods such as   citrus fruits, cabbage, garlic, and onions. Drink enough fluid to keep your urine pale yellow. Keep a journal of foods that seem to trigger symptoms. Avoid foods and drinks that: Contain added sugar. Make your symptoms worse. These may include dairy  products, caffeinated drinks, and carbonated drinks. Alcohol use Do not drink alcohol if: Your health care provider tells you not to drink. You are pregnant, may be pregnant, or are planning to become pregnant. If you drink alcohol: Limit how much you have to: 0-1 drink a day for women. 0-2 drinks a day for men. Know how much alcohol is in your drink. In the [place] one drink equals one 12 oz bottle of beer (355 mL), one 5 oz glass of wine (148 mL), or one 1 oz glass of hard liquor (44 mL) General instructions Take over-the-counter and prescription medicines only as told by your health care provider. This includes supplements. Get enough exercise. Do at least 150 minutes of moderate-intensity exercise each week. Manage your stress. Getting enough sleep and exercise can help you manage stress. Keep all follow-up visits. This is important. This includes all visits with your health care provider and therapist. Where to find more information International Foundation for Functional Gastrointestinal Disorders: aboutibs.org National Institute of Diabetes and Digestive and Kidney Diseases: niddk.nih.gov Contact a health care provider if: You have constant pain. You lose weight. You have diarrhea that gets worse. You have bleeding from the rectum. You vomit often. You have a fever. Get help right away if: You have severe abdominal pain. You have diarrhea with symptoms of dehydration, such as dizziness or dry mouth. You have bloody or black stools. You have severe abdominal bloating. You have vomiting that does not stop. You have blood in your vomit. Summary Irritable bowel syndrome (IBS) is not one specific disease. It is a group of symptoms that affects digestion. Your intestines may become more sensitive and overreact to certain things. This may be especially true when you eat certain foods or when you are under stress. There is no cure for IBS, but treatment can help relieve  symptoms. This information is not intended to replace advice given to you by your health care provider. Make sure you discuss any questions you have with your health care provider. Document Revised: 06/05/2021 Document Reviewed: 06/05/2021 Elsevier Patient Education  2023 Elsevier Inc.  Diet for Irritable Bowel Syndrome When you have irritable bowel syndrome (IBS), it is very important to follow the eating habits that are best for your condition. IBS may cause various symptoms, such as pain in the abdomen, constipation, or diarrhea. Choosing the right foods can help to ease the discomfort from these symptoms. Work with your health care provider and dietitian to find the eating plan that will help to control your symptoms. What are tips for following this plan?  Keep a food diary. This will help you identify foods that cause symptoms. Write down: What you eat and when you eat it. What symptoms you have. When symptoms occur in relation to your meals, such as "pain in abdomen 2 hours after dinner." Eat your meals slowly and in a relaxed setting. Aim to eat 5-6 small meals per day. Do not skip meals. Drink enough fluid to keep your urine pale yellow. Ask your health care provider if you should take an over-the-counter probiotic to help restore healthy bacteria in your gut (digestive tract). Probiotics are foods that contain good bacteria and yeasts. Your dietitian may have specific dietary recommendations for you based   on your symptoms. Your dietitian may recommend that you: Avoid foods that cause symptoms. Talk with your dietitian about other ways to get the same nutrients that are in those problem foods. Avoid foods with gluten. Gluten is a protein that is found in rye, wheat, and barley. Eat more foods that contain soluble fiber. Examples of foods with high soluble fiber include oats, seeds, and certain fruits and vegetables. Take a fiber supplement if told by your dietitian. Reduce or avoid  certain foods called FODMAPs. These are foods that contain sugars that are hard for some people to digest. Ask your health care provider which foods to avoid. What foods should I avoid? The following are some foods and drinks that may make your symptoms worse: Fatty foods, such as french fries. Foods that contain gluten, such as pasta and cereal. Dairy products, such as milk, cheese, and ice cream. Spicy foods. Alcohol. Products with caffeine, such as coffee, tea, or chocolate. Carbonated drinks, such as soda. Foods that are high in FODMAPs. These include certain fruits and vegetables. Products with sweeteners such as honey, high fructose corn syrup, sorbitol, and mannitol. The items listed above may not be a complete list of foods and beverages you should avoid. Contact a dietitian for more information. What foods are good sources of fiber? Your health care provider or dietitian may recommend that you eat more foods that contain fiber. Fiber can help to reduce constipation and other IBS symptoms. Add foods with fiber to your diet a little at a time so your body can get used to them. Too much fiber at one time might cause gas and swelling of your abdomen. The following are some foods that are good sources of fiber: Berries, such as raspberries, strawberries, and blueberries. Tomatoes. Carrots. Brown rice. Oats. Seeds, such as chia and pumpkin seeds. The items listed above may not be a complete list of recommended sources of fiber. Contact your dietitian for more options. Where to find more information International Foundation for Functional Gastrointestinal Disorders: aboutibs.org National Institute of Diabetes and Digestive and Kidney Diseases: niddk.nih.gov Summary When you have irritable bowel syndrome (IBS), it is very important to follow the eating habits that are best for your condition. IBS may cause various symptoms, such as pain in the abdomen, constipation, or diarrhea. Choosing  the right foods can help to ease the discomfort that comes from symptoms. Your health care provider or dietitian may recommend that you eat more foods that contain fiber. Keep a food diary. This will help you identify foods that cause symptoms. This information is not intended to replace advice given to you by your health care provider. Make sure you discuss any questions you have with your health care provider. Document Revised: 06/04/2021 Document Reviewed: 06/04/2021 Elsevier Patient Education  2023 Elsevier Inc.  

## 2022-11-28 ENCOUNTER — Ambulatory Visit (INDEPENDENT_AMBULATORY_CARE_PROVIDER_SITE_OTHER): Payer: Medicare Other | Admitting: Neurology

## 2022-11-28 ENCOUNTER — Telehealth: Payer: Self-pay

## 2022-11-28 ENCOUNTER — Ambulatory Visit: Payer: Medicare Other | Admitting: Neurology

## 2022-11-28 ENCOUNTER — Encounter: Payer: Self-pay | Admitting: Neurology

## 2022-11-28 VITALS — BP 118/70 | HR 96 | Ht 64.0 in | Wt 221.0 lb

## 2022-11-28 DIAGNOSIS — G43709 Chronic migraine without aura, not intractable, without status migrainosus: Secondary | ICD-10-CM | POA: Diagnosis not present

## 2022-11-28 LAB — FOLLICLE STIMULATING HORMONE: FSH: 7 m[IU]/mL

## 2022-11-28 NOTE — Patient Instructions (Signed)
Plan to start Botox Stop sumatriptan and Excedrin.  Treat acute migraines with rizatriptan.  Limit use of pain relievers to no more than 2 days out of week to prevent risk of rebound or medication-overuse headache. Keep headache diary Follow up for Botox.

## 2022-11-28 NOTE — Telephone Encounter (Signed)
Patient seen in office today. Patient to start Botox.   PA team please start PA botox 200 units.

## 2022-12-03 ENCOUNTER — Telehealth: Payer: Self-pay | Admitting: Pharmacy Technician

## 2022-12-03 ENCOUNTER — Other Ambulatory Visit (HOSPITAL_COMMUNITY): Payer: Self-pay

## 2022-12-03 DIAGNOSIS — G43709 Chronic migraine without aura, not intractable, without status migrainosus: Secondary | ICD-10-CM

## 2022-12-03 NOTE — Telephone Encounter (Signed)
Patient Advocate Encounter  Received notification from [employer] that prior authorization for BOTOX 200U is required.   PA submitted on 5.29.24 Key [id number]  Status is pending

## 2022-12-04 NOTE — Telephone Encounter (Signed)
PA has been submitted EXPEDITED, and telephone encounter has been created.  

## 2022-12-09 ENCOUNTER — Other Ambulatory Visit (HOSPITAL_COMMUNITY): Payer: Self-pay

## 2022-12-09 ENCOUNTER — Other Ambulatory Visit: Payer: Self-pay

## 2022-12-09 MED ORDER — ONABOTULINUMTOXINA 200 UNITS IJ SOLR
INTRAMUSCULAR | 4 refills | Status: DC
  Filled 2022-12-09: qty 1, fill #0
  Filled 2022-12-11: qty 1, 84d supply, fill #0
  Filled 2023-04-17: qty 1, 84d supply, fill #1

## 2022-12-09 NOTE — Telephone Encounter (Signed)
Botox script sent to [facility] as requested. LMOVM for patient to call as well as [facility] long phone number to give delivery consent.

## 2022-12-09 NOTE — Telephone Encounter (Signed)
Patient Advocate Encounter  Prior Authorization for BOTOX 200U has been approved with [employer].    PA# [id number] Effective dates: 5.29.24 through 12.31.24  Per [facility] test claim, copay for 90 days supply is $4.60.

## 2022-12-10 ENCOUNTER — Other Ambulatory Visit (HOSPITAL_COMMUNITY): Payer: Self-pay

## 2022-12-11 ENCOUNTER — Other Ambulatory Visit (HOSPITAL_COMMUNITY): Payer: Self-pay

## 2022-12-11 ENCOUNTER — Other Ambulatory Visit: Payer: Self-pay

## 2022-12-15 ENCOUNTER — Other Ambulatory Visit (HOSPITAL_COMMUNITY): Payer: Self-pay

## 2022-12-16 ENCOUNTER — Other Ambulatory Visit (HOSPITAL_COMMUNITY): Payer: Self-pay

## 2022-12-26 ENCOUNTER — Ambulatory Visit: Payer: Medicare Other | Admitting: Neurology

## 2023-01-01 ENCOUNTER — Encounter: Payer: Self-pay | Admitting: Obstetrics and Gynecology

## 2023-01-01 ENCOUNTER — Encounter: Payer: Self-pay | Admitting: Nurse Practitioner

## 2023-01-01 NOTE — Telephone Encounter (Signed)
Routing to [place] to f/u on referral.   Encounter closed.

## 2023-01-17 IMAGING — DX DG CHEST 1V PORT
1 series · 1 of 1 positions shown · non-contrast
Comparison: 11/21/2019

CLINICAL DATA: Chest pain.  Shortness of breath.  Anxiety.

EXAM:
PORTABLE CHEST 1 VIEW

[chest ap]
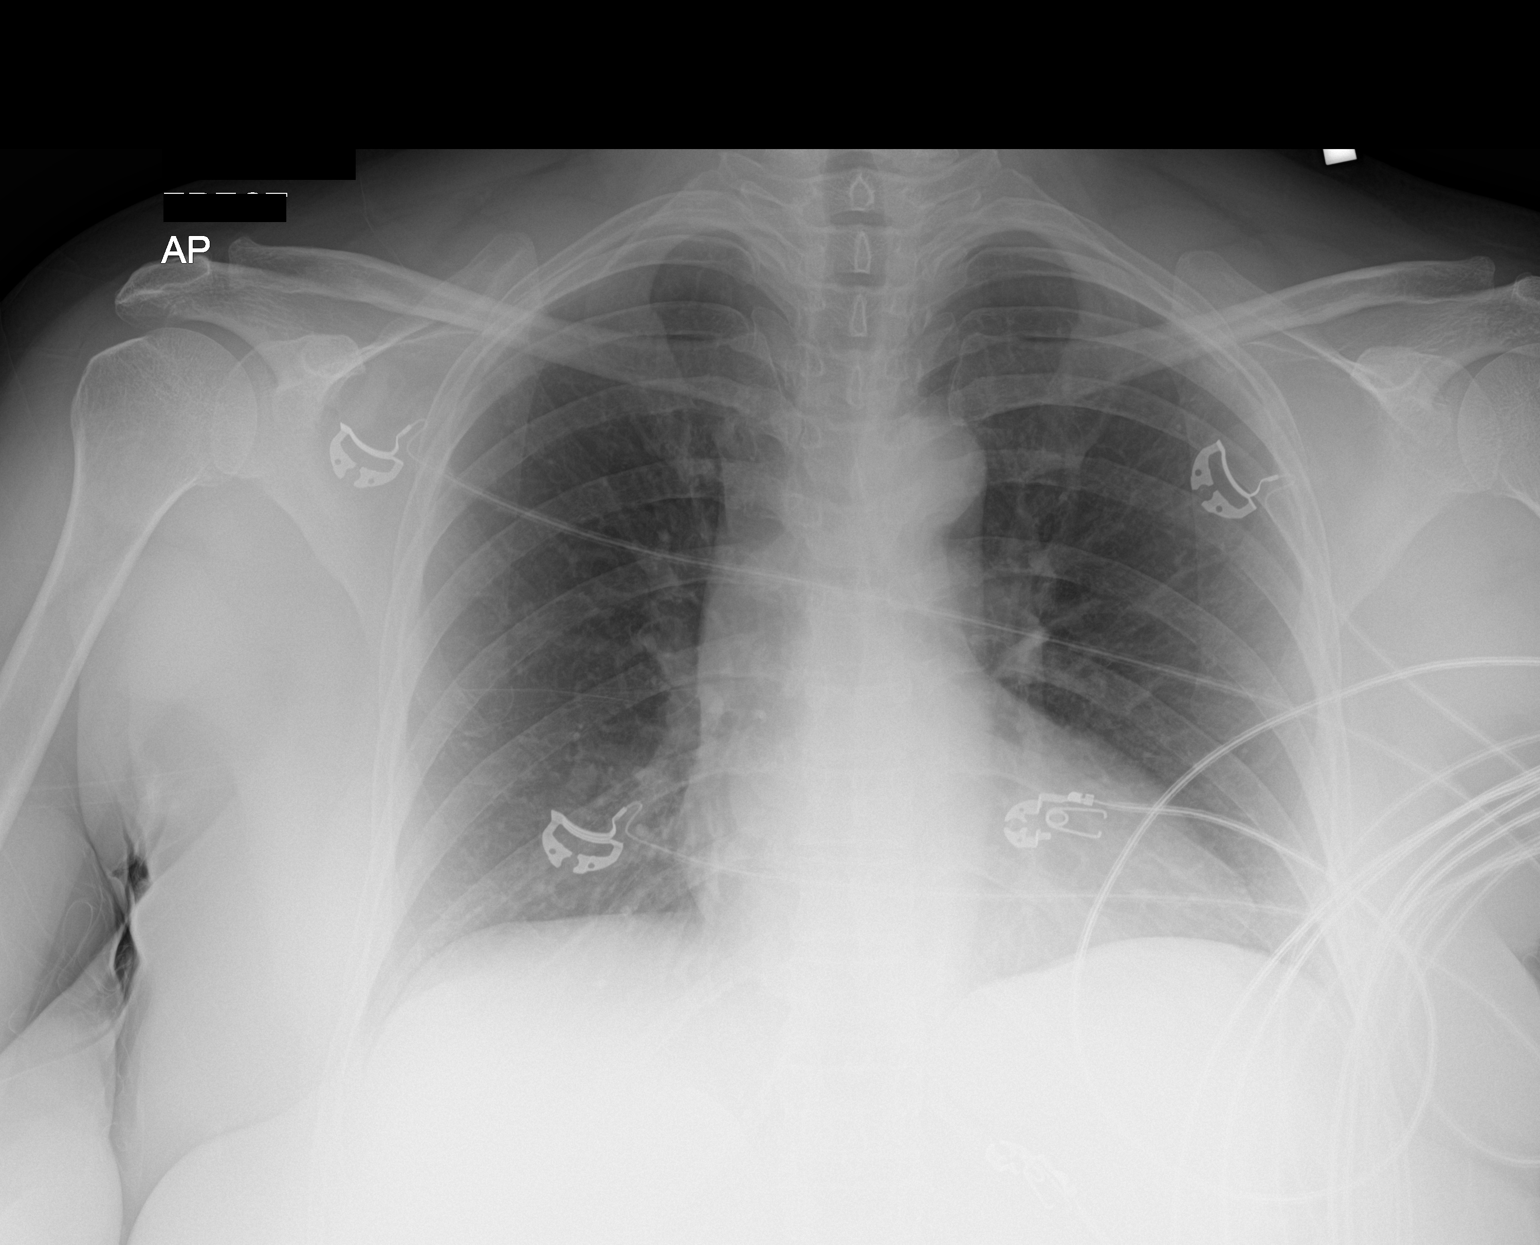

[1 of 1 positions shown; findings below may reference images not displayed]

FINDINGS: The heart size and mediastinal contours are within normal limits.
Both lungs are clear. The visualized skeletal structures are
unremarkable.
IMPRESSION: No active disease.

## 2023-01-19 ENCOUNTER — Telehealth (HOSPITAL_COMMUNITY): Payer: Self-pay

## 2023-01-19 ENCOUNTER — Other Ambulatory Visit (HOSPITAL_COMMUNITY): Payer: Self-pay | Admitting: Student in an Organized Health Care Education/Training Program

## 2023-01-19 DIAGNOSIS — F331 Major depressive disorder, recurrent, moderate: Secondary | ICD-10-CM

## 2023-01-19 MED ORDER — DULOXETINE HCL 30 MG PO CPEP: 90.0000 mg | ORAL_CAPSULE | Freq: Every day | ORAL | 1 refills | Status: DC

## 2023-01-19 NOTE — Telephone Encounter (Signed)
Pt requesting med refill for Cymbalta. Appt scheduled for 8/30th. (Next available) [facility] [place].

## 2023-01-19 NOTE — Telephone Encounter (Signed)
Done
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
## 2023-01-21 ENCOUNTER — Encounter: Payer: Medicare Other | Admitting: Physical Medicine and Rehabilitation

## 2023-01-23 ENCOUNTER — Ambulatory Visit (INDEPENDENT_AMBULATORY_CARE_PROVIDER_SITE_OTHER): Payer: Medicare Other | Admitting: Neurology

## 2023-01-23 DIAGNOSIS — G43709 Chronic migraine without aura, not intractable, without status migrainosus: Secondary | ICD-10-CM

## 2023-01-23 MED ORDER — ONABOTULINUMTOXINA 100 UNITS IJ SOLR
200.0000 [IU] | Freq: Once | INTRAMUSCULAR | Status: AC
Start: 2023-01-23 — End: 2023-01-23
  Administered 2023-01-23: 155 [IU] via INTRAMUSCULAR

## 2023-01-23 NOTE — Progress Notes (Signed)

## 2023-02-11 ENCOUNTER — Encounter: Payer: Medicare Other | Admitting: Physical Medicine and Rehabilitation

## 2023-02-26 ENCOUNTER — Ambulatory Visit: Payer: Medicare Other | Admitting: Podiatry

## 2023-03-04 ENCOUNTER — Ambulatory Visit: Payer: Medicare Other | Admitting: Internal Medicine

## 2023-03-05 ENCOUNTER — Ambulatory Visit: Payer: Medicare Other | Admitting: Podiatry

## 2023-03-06 ENCOUNTER — Ambulatory Visit (INDEPENDENT_AMBULATORY_CARE_PROVIDER_SITE_OTHER): Payer: Medicare Other | Admitting: Student in an Organized Health Care Education/Training Program

## 2023-03-06 ENCOUNTER — Encounter (HOSPITAL_COMMUNITY): Payer: Self-pay | Admitting: Student in an Organized Health Care Education/Training Program

## 2023-03-06 VITALS — BP 133/93 | HR 87 | Resp 12 | Wt 214.0 lb

## 2023-03-06 DIAGNOSIS — F331 Major depressive disorder, recurrent, moderate: Secondary | ICD-10-CM

## 2023-03-06 DIAGNOSIS — F411 Generalized anxiety disorder: Secondary | ICD-10-CM

## 2023-03-06 DIAGNOSIS — F410 Panic disorder [episodic paroxysmal anxiety] without agoraphobia: Secondary | ICD-10-CM

## 2023-03-06 DIAGNOSIS — F5105 Insomnia due to other mental disorder: Secondary | ICD-10-CM

## 2023-03-06 DIAGNOSIS — F99 Mental disorder, not otherwise specified: Secondary | ICD-10-CM | POA: Diagnosis not present

## 2023-03-06 MED ORDER — DULOXETINE HCL 60 MG PO CPEP: 60.0000 mg | ORAL_CAPSULE | Freq: Two times a day (BID) | ORAL | 2 refills | Status: DC

## 2023-03-06 MED ORDER — HYDROXYZINE HCL 25 MG PO TABS: ORAL_TABLET | ORAL | 3 refills | Status: DC

## 2023-03-06 MED ORDER — TRAZODONE HCL 100 MG PO TABS
100.0000 mg | ORAL_TABLET | Freq: Every day | ORAL | 2 refills | Status: DC
Start: 2023-03-06 — End: 2023-04-24
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
## 2023-03-11 ENCOUNTER — Other Ambulatory Visit (HOSPITAL_COMMUNITY): Payer: Self-pay

## 2023-03-17 ENCOUNTER — Ambulatory Visit: Payer: Medicare Other | Admitting: Nurse Practitioner

## 2023-03-23 ENCOUNTER — Encounter: Payer: Medicare Other | Admitting: Physical Medicine and Rehabilitation

## 2023-04-09 ENCOUNTER — Other Ambulatory Visit: Payer: Self-pay

## 2023-04-15 ENCOUNTER — Other Ambulatory Visit: Payer: Self-pay
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
## 2023-04-17 ENCOUNTER — Other Ambulatory Visit: Payer: Self-pay

## 2023-04-17 ENCOUNTER — Encounter: Payer: Self-pay | Admitting: Physical Medicine and Rehabilitation

## 2023-04-17 ENCOUNTER — Encounter
Payer: Medicare Other | Attending: Physical Medicine and Rehabilitation | Admitting: Physical Medicine and Rehabilitation

## 2023-04-17 ENCOUNTER — Other Ambulatory Visit (HOSPITAL_COMMUNITY): Payer: Self-pay | Admitting: Pharmacy Technician

## 2023-04-17 VITALS — BP 143/94 | HR 99 | Ht 64.0 in | Wt 221.0 lb

## 2023-04-17 DIAGNOSIS — M546 Pain in thoracic spine: Secondary | ICD-10-CM | POA: Diagnosis not present

## 2023-04-17 DIAGNOSIS — R5382 Chronic fatigue, unspecified: Secondary | ICD-10-CM | POA: Diagnosis present

## 2023-04-17 DIAGNOSIS — G8929 Other chronic pain: Secondary | ICD-10-CM | POA: Insufficient documentation

## 2023-04-17 DIAGNOSIS — E559 Vitamin D deficiency, unspecified: Secondary | ICD-10-CM | POA: Diagnosis present

## 2023-04-17 DIAGNOSIS — M069 Rheumatoid arthritis, unspecified: Secondary | ICD-10-CM | POA: Diagnosis not present

## 2023-04-17 DIAGNOSIS — G894 Chronic pain syndrome: Secondary | ICD-10-CM | POA: Insufficient documentation

## 2023-04-17 MED ORDER — LEVETIRACETAM 250 MG PO TABS: 250.0000 mg | ORAL_TABLET | Freq: Two times a day (BID) | ORAL | 5 refills | Status: DC

## 2023-04-17 MED ORDER — LEVOTHYROXINE SODIUM 50 MCG PO TABS: 50.0000 ug | ORAL_TABLET | Freq: Every day | ORAL | 5 refills | Status: DC

## 2023-04-17 NOTE — Progress Notes (Signed)
Specialty Pharmacy Refill Coordination Note  [name] is a 47 y.o. female contacted today regarding refills of specialty medication(s) Onabotulinumtoxina   Patient requested Daryll Drown at [facility] at [address]   Delivery date: No data recorded  Verified address: No data recorded  Medication will be filled on 04/20/23.   Courier 04/21/23  Medication will be Courier to MD office [facility] [address].  Patient Filled out mychart assessment with Pickup & try to call but Voicemail is full.   Botox has to be Courier to MD office.

## 2023-04-17 NOTE — Patient Instructions (Signed)
Patient is 47 yr old R handed female with hx of RA (dx'd 2019), psoriatic arthritis, breast CA- 2017 dx'd, and Fibromyalgia- secondary to breast cancer TREATMENT-- Sx's since 2019; and here for f/u of L>>>R Thoracic back pain.    Referral- to Dr Aleen Sells- from Sports Medicine for OMT_ osteopathic manipulation-  For OMT due to having RA, thoracic back pain- "rib popping out of place"-  and pain in thoracic R back- was after breast CA treatment-  also has FMS  2.  Suggest weaning naltrexone to every other day for 1 week, then stop- now if it becomes worse, then we know it was helping. And would restart it if pain gets worse. Be stable on Naltrexone or it it before starting Keppra.    3. We discussed Keppra/Levicetracam- originally anti seizure med- is not FDA approved for pain, but is for seizures.   250 mg 2x/day x 2 weeks, then increase to 500 mg 2x/day-   -1-3% has irritability with it- can be severe- 50% of people respond to Vit B complex- is over the counter-  - don't start until off /decision on Naltrexone is made.  - 4.  If this doesn't work, can try Trilpetal- as the next medicine. Otherwise, would be done with tryind meds.    5. Start thyroid medicine- due to last TSH of 5.19- which is high- was last checked in May-  - weight gain- can be 10-15 lbs Dry hair Losing eyebrows- lateral aspect of eyebrows Dry skin Overall fatigue Depression Taking more energy to accomplish a task-  Take medicine same time every day. Don't take around food -our goal is to aim for 1.0 for thyroid-   6. Stopped taking Vit D-  will check Vit D level due to severe fatigue and treat as appropriate- will likely need treatment based on last 2 Vit D levels.    7. F/U in 3 months- f/u on thyroid and pain

## 2023-04-18 LAB — VITAMIN D 25 HYDROXY (VIT D DEFICIENCY, FRACTURES): Vit D, 25-Hydroxy: 38.2 ng/mL (ref 30.0–100.0)

## 2023-04-20 ENCOUNTER — Other Ambulatory Visit: Payer: Self-pay

## 2023-04-20 ENCOUNTER — Other Ambulatory Visit: Payer: Self-pay | Admitting: Family

## 2023-04-20 DIAGNOSIS — Z0000 Encounter for general adult medical examination without abnormal findings: Secondary | ICD-10-CM

## 2023-04-21 ENCOUNTER — Ambulatory Visit
Admission: RE | Admit: 2023-04-21 | Discharge: 2023-04-21 | Disposition: A | Discharge status: Home / Self Care | Payer: Medicare Other | Source: Ambulatory Visit | Attending: Family | Admitting: Family

## 2023-04-21 DIAGNOSIS — Z0000 Encounter for general adult medical examination without abnormal findings: Secondary | ICD-10-CM

## 2023-04-23 ENCOUNTER — Ambulatory Visit: Payer: Medicare Other | Admitting: Physician Assistant

## 2023-04-24 ENCOUNTER — Ambulatory Visit: Payer: Medicare Other | Admitting: Neurology

## 2023-04-24 ENCOUNTER — Encounter: Payer: Self-pay | Admitting: Neurology

## 2023-04-24 ENCOUNTER — Other Ambulatory Visit: Payer: Self-pay | Admitting: Family

## 2023-04-24 ENCOUNTER — Encounter: Payer: Self-pay | Admitting: Family

## 2023-04-24 ENCOUNTER — Ambulatory Visit (INDEPENDENT_AMBULATORY_CARE_PROVIDER_SITE_OTHER): Payer: Medicare Other | Admitting: Student in an Organized Health Care Education/Training Program

## 2023-04-24 DIAGNOSIS — F331 Major depressive disorder, recurrent, moderate: Secondary | ICD-10-CM | POA: Diagnosis not present

## 2023-04-24 DIAGNOSIS — F99 Mental disorder, not otherwise specified: Secondary | ICD-10-CM

## 2023-04-24 DIAGNOSIS — F410 Panic disorder [episodic paroxysmal anxiety] without agoraphobia: Secondary | ICD-10-CM

## 2023-04-24 DIAGNOSIS — R928 Other abnormal and inconclusive findings on diagnostic imaging of breast: Secondary | ICD-10-CM

## 2023-04-24 DIAGNOSIS — F411 Generalized anxiety disorder: Secondary | ICD-10-CM

## 2023-04-24 DIAGNOSIS — F5105 Insomnia due to other mental disorder: Secondary | ICD-10-CM | POA: Diagnosis not present

## 2023-04-24 MED ORDER — TRAZODONE HCL 100 MG PO TABS
100.0000 mg | ORAL_TABLET | Freq: Every day | ORAL | 2 refills | Status: DC
Start: 2023-04-24 — End: 2023-09-25

## 2023-04-24 MED ORDER — HYDROXYZINE HCL 25 MG PO TABS
ORAL_TABLET | ORAL | 3 refills | Status: DC
Start: 2023-04-24 — End: 2023-09-25

## 2023-04-24 MED ORDER — DULOXETINE HCL 60 MG PO CPEP
60.0000 mg | ORAL_CAPSULE | Freq: Two times a day (BID) | ORAL | 2 refills | Status: DC
Start: 2023-04-24 — End: 2023-09-25
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
## 2023-05-06 ENCOUNTER — Other Ambulatory Visit: Payer: Self-pay | Admitting: Obstetrics and Gynecology

## 2023-05-06 ENCOUNTER — Ambulatory Visit
Admission: RE | Admit: 2023-05-06 | Discharge: 2023-05-06 | Disposition: A | Discharge status: Home / Self Care | Payer: Medicare Other | Source: Ambulatory Visit | Attending: Family | Admitting: Family

## 2023-05-06 ENCOUNTER — Telehealth: Payer: Self-pay | Admitting: Neurology

## 2023-05-06 DIAGNOSIS — R928 Other abnormal and inconclusive findings on diagnostic imaging of breast: Secondary | ICD-10-CM

## 2023-05-06 DIAGNOSIS — Z1231 Encounter for screening mammogram for malignant neoplasm of breast: Secondary | ICD-10-CM

## 2023-05-06 NOTE — Telephone Encounter (Signed)
Pt called in stating she thinks the botox injections have been making sores on her head. She doesn't want to continue with the injections until she can see a dermatologist to make sure it isn't something else.

## 2023-05-08 ENCOUNTER — Encounter (HOSPITAL_COMMUNITY): Payer: Medicare Other | Admitting: Student in an Organized Health Care Education/Training Program

## 2023-05-11 ENCOUNTER — Ambulatory Visit (INDEPENDENT_AMBULATORY_CARE_PROVIDER_SITE_OTHER): Payer: Medicare Other | Admitting: Sports Medicine

## 2023-05-11 VITALS — BP 126/80 | HR 87 | Ht 64.0 in | Wt 219.0 lb

## 2023-05-11 DIAGNOSIS — M9905 Segmental and somatic dysfunction of pelvic region: Secondary | ICD-10-CM

## 2023-05-11 DIAGNOSIS — M9902 Segmental and somatic dysfunction of thoracic region: Secondary | ICD-10-CM

## 2023-05-11 DIAGNOSIS — M9901 Segmental and somatic dysfunction of cervical region: Secondary | ICD-10-CM | POA: Diagnosis not present

## 2023-05-11 DIAGNOSIS — M546 Pain in thoracic spine: Secondary | ICD-10-CM

## 2023-05-11 DIAGNOSIS — M9908 Segmental and somatic dysfunction of rib cage: Secondary | ICD-10-CM

## 2023-05-11 DIAGNOSIS — M0579 Rheumatoid arthritis with rheumatoid factor of multiple sites without organ or systems involvement: Secondary | ICD-10-CM | POA: Diagnosis not present

## 2023-05-11 DIAGNOSIS — R0781 Pleurodynia: Secondary | ICD-10-CM | POA: Diagnosis not present

## 2023-05-11 DIAGNOSIS — M9903 Segmental and somatic dysfunction of lumbar region: Secondary | ICD-10-CM

## 2023-05-11 DIAGNOSIS — G8929 Other chronic pain: Secondary | ICD-10-CM

## 2023-05-11 NOTE — Patient Instructions (Signed)
Recommend no wiring in bras for 2-3 weeks recommend a non wire or sports bra 3-4 week follow up for MSK and repeat injection

## 2023-05-11 NOTE — Progress Notes (Signed)
[name] D.[facility] [address] [place] [address] Phone: [phone]   Assessment and Plan:     1. Rib pain 2. Chronic bilateral thoracic back pain 3. Rheumatoid arthritis involving multiple sites with positive rheumatoid factor (HCC) 4. Somatic dysfunction of cervical region 5. Somatic dysfunction of thoracic region 6. Somatic dysfunction of lumbar region 7. Somatic dysfunction of pelvic region 8. Somatic dysfunction of rib region  -Chronic with exacerbation, initial sports medicine visit - Patient presenting with primarily bilateral anterior lateral lower rib cage discomfort and pain, though patient additionally has upper back, lower back pain that is chronic in nature.  Rib cage pain seemed to start after treatment for breast cancer in 2018. - Currently unclear etiology of chronic pain.  No significant findings on prior x-ray imaging.  Pain may be related to patient wearing bras with hard metal wiring that are putting pressure around anterior lateral rib cage.  Recommend wearing supportive sports bras and not wearing metal wiring bras for the next 2 to 3 weeks to see if nature of pain changes - Patient elected for initial OMT today.  Tolerated well per note below.  Patient has past medical history of RA, so no HVLA was performed to C-spine - Decision today to treat with OMT was based on Physical Exam   After verbal consent patient was treated with HVLA (high velocity low amplitude), ME (muscle energy), FPR (flex positional release), ST (soft tissue), PC/PD (Pelvic Compression/ Pelvic Decompression) techniques in cervical, rib, thoracic, lumbar, and pelvic areas. Patient tolerated the procedure well with improvement in symptoms.  Patient educated on potential side effects of soreness and recommended to rest, hydrate, and use Tylenol as needed for pain control.   Pertinent previous records reviewed include PMNR note 04/17/2023, neurology note 11/24/2022,  PMNR note 08/01/22   Follow Up: 3 to 4 weeks for reevaluation.  Could consider repeat OMT if patient found today's treatment beneficial   Subjective:   I, [name], am serving as a Neurosurgeon for Doctor Richardean Sale  Chief Complaint: OMT  HPI:   05/11/23 Patient is a 47 year old female that is here for OMT . patient states that she has been having issues with her ribs. Has pain when she laughs , coughs or any twisting movement . She has extreme pain bilaterally . No MOI . Pain started around cancer treatments remission since 2018. Ibu and tylenol do not help. She is able to reposition her self to help with the pain, and that will take the edge off. Hx of migraines. Pain does radiate to the back and under the rib cage in the front   Relevant Historical Information: RA, breast cancer, fibromyalgia  Additional pertinent review of systems negative.  Current Outpatient Medications  Medication Sig Dispense Refill   albuterol (VENTOLIN HFA) 108 (90 Base) MCG/ACT inhaler Inhale into the lungs every 6 (six) hours as needed for wheezing or shortness of breath.     botulinum toxin Type A (BOTOX) 200 units injection Inject 155 units IM into multiple site in the face,neck and head once every 90 days 1 each 4   clobetasol ointment (TEMOVATE) 0.05 % Apply 1 Application topically 2 (two) times daily. Apply as directed twice daily for up to 2 weeks as needed 60 g 0   DULoxetine (CYMBALTA) 60 MG capsule Take 1 capsule (60 mg total) by mouth 2 (two) times daily. 60 capsule 2   EPINEPHrine 0.3 mg/0.3 mL IJ SOAJ injection  Inject 0.3 mg into the muscle as needed for anaphylaxis. 1 each 1   hydrocortisone (ANUSOL-HC) 25 MG suppository Place 1 suppository (25 mg total) rectally 2 (two) times daily as needed for hemorrhoids or anal itching. 14 suppository 1   hydrOXYzine (ATARAX) 25 MG tablet 1 tablet at bedtime as needed Orally Once a day for 30 day(s) 30 tablet 3   ibuprofen (ADVIL) 800 MG tablet Take 800  mg by mouth every 8 (eight) hours as needed.     levETIRAcetam (KEPPRA) 250 MG tablet Take 1 tablet (250 mg total) by mouth 2 (two) times daily. X 2 weeks,  then 500 mg BID- for nerve pain 120 tablet 5   levothyroxine (SYNTHROID) 50 MCG tablet Take 1 tablet (50 mcg total) by mouth daily before breakfast. 30 tablet 5   omeprazole (PRILOSEC) 40 MG capsule Take 1 capsule (40 mg total) by mouth 2 (two) times daily. Take in the morning and at dinnertime. 60 capsule 11   rizatriptan (MAXALT) 10 MG tablet 1 tablet Orally Once a day for 1 day(s)     traZODone (DESYREL) 100 MG tablet Take 1 tablet (100 mg total) by mouth at bedtime. 30 tablet 2   triamcinolone cream (KENALOG) 0.1 % Apply topically 2 (two) times daily. 30 g 0   Vitamin D, Ergocalciferol, (DRISDOL) 1.25 MG (50000 UNIT) CAPS capsule      No current facility-administered medications for this visit.      Objective:     Vitals:   05/11/23 1359  BP: 126/80  Pulse: 87  SpO2: 97%  Weight: 219 lb (99.3 kg)  Height: 5\' 4"  (1.626 m)      Body mass index is 37.59 kg/m.    Physical Exam:     General: Well-appearing, cooperative, sitting comfortably in no acute distress.   OMT Physical Exam:  ASIS Compression Test: Positive Right Cervical: TTP paraspinal, C4-6 RRSR Rib: Bilateral elevated first rib with TTP Thoracic: TTP paraspinal, T6 RRSR Lumbar: TTP paraspinal, L2 RLSL Pelvis: Right anterior innominate  Electronically signed by:  [name] D.[facility] 3:13 PM 05/11/23

## 2023-05-13 ENCOUNTER — Other Ambulatory Visit: Payer: Self-pay

## 2023-05-15 ENCOUNTER — Encounter (HOSPITAL_COMMUNITY): Payer: Self-pay | Admitting: Student in an Organized Health Care Education/Training Program

## 2023-05-15 ENCOUNTER — Ambulatory Visit (INDEPENDENT_AMBULATORY_CARE_PROVIDER_SITE_OTHER): Payer: Medicare Other | Admitting: Student in an Organized Health Care Education/Training Program

## 2023-05-15 VITALS — BP 137/86 | HR 96 | Wt 218.0 lb

## 2023-05-15 DIAGNOSIS — F332 Major depressive disorder, recurrent severe without psychotic features: Secondary | ICD-10-CM

## 2023-05-15 MED ORDER — BUPROPION HCL ER (XL) 150 MG PO TB24: 150.0000 mg | ORAL_TABLET | Freq: Every day | ORAL | 0 refills | Status: DC
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
## 2023-06-08 ENCOUNTER — Ambulatory Visit: Payer: Medicare Other | Admitting: Neurology

## 2023-06-12 ENCOUNTER — Encounter (HOSPITAL_COMMUNITY): Payer: Medicare Other | Admitting: Student in an Organized Health Care Education/Training Program

## 2023-06-25 NOTE — Progress Notes (Signed)
[name] D.[facility] [address] [place] [address] Phone: [phone]   Assessment and Plan:     1. Rib pain 2. Chronic bilateral thoracic back pain 3. Rheumatoid arthritis involving multiple sites with positive rheumatoid factor (HCC) 4. Somatic dysfunction of cervical region 5. Somatic dysfunction of thoracic region 6. Somatic dysfunction of lumbar region 7. Somatic dysfunction of pelvic region 8. Somatic dysfunction of rib region  -Chronic with exacerbation, subsequent visit - Overall improvement in symptoms after OMT, stopping use of wire frame bras.  Patient did have 1 episode of shooting muscular tightness in left upper extremity a week ago, though likely unrelated to ongoing rib cage and thoracic somatic dysfunction - Continue follow-up with neurology and PMNR and rheumatology - Patient has received relief with OMT in the past.  Elects for repeat OMT today.  Tolerated well per note below.  No HVLA on cervical spine with past medical history of rheumatoid arthritis - Decision today to treat with OMT was based on Physical Exam  After verbal consent patient was treated with HVLA (high velocity low amplitude), ME (muscle energy), FPR (flex positional release), ST (soft tissue), PC/PD (Pelvic Compression/ Pelvic Decompression) techniques in cervical, rib, thoracic, lumbar, and pelvic areas. Patient tolerated the procedure well with improvement in symptoms.  Patient educated on potential side effects of soreness and recommended to rest, hydrate, and use Tylenol as needed for pain control.   Pertinent previous records reviewed include none  Follow Up: 4 weeks for reevaluation.  Could consider repeat OMT   Subjective:   I, [name], am serving as a Neurosurgeon for Doctor Richardean Sale  Chief Complaint: OMT   HPI:    05/11/23 Patient is a 47 year old female that is here for OMT . patient states that she has been having issues  with her ribs. Has pain when she laughs , coughs or any twisting movement . She has extreme pain bilaterally . No MOI . Pain started around cancer treatments remission since 2018. Ibu and tylenol do not help. She is able to reposition her self to help with the pain, and that will take the edge off. Hx of migraines. Pain does radiate to the back and under the rib cage in the front   06/26/2023 Patient states had improvement for a month then had a flare side stitch / that felt like everything shifted. She had a muscle spasm in her hand and forearm   Relevant Historical Information: RA, breast cancer, fibromyalgia  Additional pertinent review of systems negative.   Current Outpatient Medications:    albuterol (VENTOLIN HFA) 108 (90 Base) MCG/ACT inhaler, Inhale into the lungs every 6 (six) hours as needed for wheezing or shortness of breath., Disp: , Rfl:    botulinum toxin Type A (BOTOX) 200 units injection, Inject 155 units IM into multiple site in the face,neck and head once every 90 days, Disp: 1 each, Rfl: 4   buPROPion (WELLBUTRIN XL) 150 MG 24 hr tablet, Take 1 tablet (150 mg total) by mouth daily., Disp: 60 tablet, Rfl: 0   clobetasol ointment (TEMOVATE) 0.05 %, Apply 1 Application topically 2 (two) times daily. Apply as directed twice daily for up to 2 weeks as needed, Disp: 60 g, Rfl: 0   DULoxetine (CYMBALTA) 60 MG capsule, Take 1 capsule (60 mg total) by mouth 2 (two) times daily., Disp: 60 capsule, Rfl: 2   EPINEPHrine 0.3 mg/0.3 mL IJ SOAJ injection, Inject 0.3 mg into  the muscle as needed for anaphylaxis., Disp: 1 each, Rfl: 1   hydrocortisone (ANUSOL-HC) 25 MG suppository, Place 1 suppository (25 mg total) rectally 2 (two) times daily as needed for hemorrhoids or anal itching., Disp: 14 suppository, Rfl: 1   hydrOXYzine (ATARAX) 25 MG tablet, 1 tablet at bedtime as needed Orally Once a day for 30 day(s), Disp: 30 tablet, Rfl: 3   ibuprofen (ADVIL) 800 MG tablet, Take 800 mg by mouth  every 8 (eight) hours as needed., Disp: , Rfl:    levETIRAcetam (KEPPRA) 250 MG tablet, Take 1 tablet (250 mg total) by mouth 2 (two) times daily. X 2 weeks,  then 500 mg BID- for nerve pain, Disp: 120 tablet, Rfl: 5   levothyroxine (SYNTHROID) 50 MCG tablet, Take 1 tablet (50 mcg total) by mouth daily before breakfast., Disp: 30 tablet, Rfl: 5   omeprazole (PRILOSEC) 40 MG capsule, Take 1 capsule (40 mg total) by mouth 2 (two) times daily. Take in the morning and at dinnertime., Disp: 60 capsule, Rfl: 11   rizatriptan (MAXALT) 10 MG tablet, 1 tablet Orally Once a day for 1 day(s), Disp: , Rfl:    traZODone (DESYREL) 100 MG tablet, Take 1 tablet (100 mg total) by mouth at bedtime., Disp: 30 tablet, Rfl: 2   triamcinolone cream (KENALOG) 0.1 %, Apply topically 2 (two) times daily., Disp: 30 g, Rfl: 0   Vitamin D, Ergocalciferol, (DRISDOL) 1.25 MG (50000 UNIT) CAPS capsule, , Disp: , Rfl:    Objective:     Vitals:   06/26/23 1545  Pulse: 99  SpO2: 99%  Weight: 218 lb (98.9 kg)  Height: 5\' 4"  (1.626 m)      Body mass index is 37.42 kg/m.    Physical Exam:    General: Well-appearing, cooperative, sitting comfortably in no acute distress.   OMT Physical Exam:  ASIS Compression Test: Positive Right Cervical: TTP paraspinal, C3-5 RRSR Rib: Bilateral elevated first rib with TTP Thoracic: TTP paraspinal, T3-5 RLSR, T6-8 RRSL Lumbar: TTP paraspinal, L-3 RRSL Pelvis: Right anterior innominate    Electronically signed by:  [name] D.[facility] 4:17 PM 06/26/23

## 2023-06-26 ENCOUNTER — Ambulatory Visit (INDEPENDENT_AMBULATORY_CARE_PROVIDER_SITE_OTHER): Payer: Medicare Other | Admitting: Sports Medicine

## 2023-06-26 VITALS — HR 99 | Ht 64.0 in | Wt 218.0 lb

## 2023-06-26 DIAGNOSIS — M9902 Segmental and somatic dysfunction of thoracic region: Secondary | ICD-10-CM

## 2023-06-26 DIAGNOSIS — M9908 Segmental and somatic dysfunction of rib cage: Secondary | ICD-10-CM

## 2023-06-26 DIAGNOSIS — M9901 Segmental and somatic dysfunction of cervical region: Secondary | ICD-10-CM | POA: Diagnosis not present

## 2023-06-26 DIAGNOSIS — R0781 Pleurodynia: Secondary | ICD-10-CM | POA: Diagnosis not present

## 2023-06-26 DIAGNOSIS — M546 Pain in thoracic spine: Secondary | ICD-10-CM | POA: Diagnosis not present

## 2023-06-26 DIAGNOSIS — M0579 Rheumatoid arthritis with rheumatoid factor of multiple sites without organ or systems involvement: Secondary | ICD-10-CM

## 2023-06-26 DIAGNOSIS — M9905 Segmental and somatic dysfunction of pelvic region: Secondary | ICD-10-CM

## 2023-06-26 DIAGNOSIS — G8929 Other chronic pain: Secondary | ICD-10-CM

## 2023-06-26 DIAGNOSIS — M9903 Segmental and somatic dysfunction of lumbar region: Secondary | ICD-10-CM
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
## 2023-06-30 ENCOUNTER — Ambulatory Visit: Payer: Medicare Other | Admitting: Neurology

## 2023-07-03 ENCOUNTER — Encounter (HOSPITAL_COMMUNITY): Payer: Medicare Other | Admitting: Student in an Organized Health Care Education/Training Program

## 2023-07-13 ENCOUNTER — Other Ambulatory Visit (HOSPITAL_COMMUNITY): Payer: Self-pay

## 2023-07-17 ENCOUNTER — Encounter: Payer: Medicare Other | Admitting: Physical Medicine and Rehabilitation

## 2023-07-17 ENCOUNTER — Other Ambulatory Visit (HOSPITAL_COMMUNITY): Payer: Self-pay

## 2023-07-17 ENCOUNTER — Encounter (HOSPITAL_COMMUNITY): Payer: Self-pay

## 2023-07-20 ENCOUNTER — Other Ambulatory Visit: Payer: Self-pay

## 2023-07-24 ENCOUNTER — Ambulatory Visit: Payer: Medicare Other | Admitting: Neurology

## 2023-08-03 ENCOUNTER — Ambulatory Visit: Payer: Medicare Other | Admitting: Sports Medicine
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
## 2023-08-31 ENCOUNTER — Encounter
Payer: Medicare Other | Attending: Physical Medicine and Rehabilitation | Admitting: Physical Medicine and Rehabilitation

## 2023-08-31 ENCOUNTER — Encounter: Payer: Self-pay | Admitting: Physical Medicine and Rehabilitation

## 2023-08-31 VITALS — BP 139/93 | HR 77

## 2023-08-31 DIAGNOSIS — G894 Chronic pain syndrome: Secondary | ICD-10-CM | POA: Diagnosis present

## 2023-08-31 DIAGNOSIS — F332 Major depressive disorder, recurrent severe without psychotic features: Secondary | ICD-10-CM | POA: Insufficient documentation

## 2023-08-31 DIAGNOSIS — M797 Fibromyalgia: Secondary | ICD-10-CM | POA: Insufficient documentation

## 2023-08-31 DIAGNOSIS — M0579 Rheumatoid arthritis with rheumatoid factor of multiple sites without organ or systems involvement: Secondary | ICD-10-CM | POA: Diagnosis present

## 2023-08-31 DIAGNOSIS — E039 Hypothyroidism, unspecified: Secondary | ICD-10-CM | POA: Insufficient documentation

## 2023-08-31 DIAGNOSIS — E559 Vitamin D deficiency, unspecified: Secondary | ICD-10-CM | POA: Insufficient documentation

## 2023-08-31 NOTE — Progress Notes (Unsigned)
 [name] D.[facility] [address] [place] [address] Phone: [phone]   Assessment and Plan:    1. Rib pain 2. Chronic bilateral thoracic back pain 3. Rheumatoid arthritis involving multiple sites with positive rheumatoid factor (HCC) 4. History of breast cancer -Chronic with exacerbation, subsequent visit - Patient continues to have significant, stabbing pain wrapping around bilateral rib cage from thoracic region to sternum.  Patient was originally benefiting from conservative treatment including HEP and OMT, however patient has had recurrent flare of pain without specific MOI that is debilitating at times - X-rays obtained in clinic.  My interpretation: No acute fracture, dislocation, or vertebral collapse. - Based on patient's chronic pain, severity of pain often >6/10, no improvement with conservative therapy, past medical history of breast cancer, I recommend further evaluation of thoracic spine and rib cage with MRIs with and without contrast.  We will order chest MRI with without contrast to evaluate rib cage, however there may be a more appropriate order that we could change to based on radiology recommendations. - Continue follow-up with neurology and PMNR and rheumatology - Elected to not proceed with OMT at today's visit  Pertinent previous records reviewed include none  Follow Up: 5 days after MRIs to review results and discuss treatment plan   Subjective:   I, Jerene Canny, am serving as a Neurosurgeon for Doctor Richardean Sale  Chief Complaint: OMT   HPI:    05/11/23 Patient is a 48 year old female that is here for OMT . patient states that she has been having issues with her ribs. Has pain when she laughs , coughs or any twisting movement . She has extreme pain bilaterally . No MOI . Pain started around cancer treatments remission since 2018. Ibu and tylenol do not help. She is able to reposition her self to help with  the pain, and that will take the edge off. Hx of migraines. Pain does radiate to the back and under the rib cage in the front    06/26/2023 Patient states had improvement for a month then had a flare side stitch / that felt like everything shifted. She had a muscle spasm in her hand and forearm   09/01/2023 Patient states still has rib pain   Relevant Historical Information: RA, breast cancer, fibromyalgia  Additional pertinent review of systems negative.   Current Outpatient Medications:    albuterol (VENTOLIN HFA) 108 (90 Base) MCG/ACT inhaler, Inhale into the lungs every 6 (six) hours as needed for wheezing or shortness of breath., Disp: , Rfl:    botulinum toxin Type A (BOTOX) 200 units injection, Inject 155 units IM into multiple site in the face,neck and head once every 90 days, Disp: 1 each, Rfl: 4   buPROPion (WELLBUTRIN XL) 150 MG 24 hr tablet, Take 1 tablet (150 mg total) by mouth daily., Disp: 60 tablet, Rfl: 0   clobetasol ointment (TEMOVATE) 0.05 %, Apply 1 Application topically 2 (two) times daily. Apply as directed twice daily for up to 2 weeks as needed, Disp: 60 g, Rfl: 0   DULoxetine (CYMBALTA) 60 MG capsule, Take 1 capsule (60 mg total) by mouth 2 (two) times daily., Disp: 60 capsule, Rfl: 2   EPINEPHrine 0.3 mg/0.3 mL IJ SOAJ injection, Inject 0.3 mg into the muscle as needed for anaphylaxis., Disp: 1 each, Rfl: 1   hydrocortisone (ANUSOL-HC) 25 MG suppository, Place 1 suppository (25 mg total) rectally 2 (two) times daily as needed for hemorrhoids  or anal itching., Disp: 14 suppository, Rfl: 1   hydrOXYzine (ATARAX) 25 MG tablet, 1 tablet at bedtime as needed Orally Once a day for 30 day(s), Disp: 30 tablet, Rfl: 3   ibuprofen (ADVIL) 800 MG tablet, Take 800 mg by mouth every 8 (eight) hours as needed., Disp: , Rfl:    levETIRAcetam (KEPPRA) 250 MG tablet, Take 1 tablet (250 mg total) by mouth 2 (two) times daily. X 2 weeks,  then 500 mg BID- for nerve pain, Disp: 120  tablet, Rfl: 5   levothyroxine (SYNTHROID) 50 MCG tablet, Take 1 tablet (50 mcg total) by mouth daily before breakfast., Disp: 30 tablet, Rfl: 5   omeprazole (PRILOSEC) 40 MG capsule, Take 1 capsule (40 mg total) by mouth 2 (two) times daily. Take in the morning and at dinnertime., Disp: 60 capsule, Rfl: 11   rizatriptan (MAXALT) 10 MG tablet, 1 tablet Orally Once a day for 1 day(s), Disp: , Rfl:    traZODone (DESYREL) 100 MG tablet, Take 1 tablet (100 mg total) by mouth at bedtime., Disp: 30 tablet, Rfl: 2   triamcinolone cream (KENALOG) 0.1 %, Apply topically 2 (two) times daily., Disp: 30 g, Rfl: 0   Vitamin D, Ergocalciferol, (DRISDOL) 1.25 MG (50000 UNIT) CAPS capsule, , Disp: , Rfl:    Objective:     Vitals:   09/01/23 1522  BP: 134/82  Pulse: (!) 105  SpO2: 97%  Weight: 225 lb (102.1 kg)  Height: 5\' 4"  (1.626 m)      Body mass index is 38.62 kg/m.    Physical Exam:    General: Well-appearing, cooperative, sitting comfortably in no acute distress.  HEENT: Normocephalic, atraumatic.   Neck: No gross abnormality.  Cardiovascular: No pallor or cyanosis. Resp: Comfortable WOB.  Pain along bilateral inferior rib cage with deep inhalation and exhalation.  Pain with palpation of thoracic paraspinal, entirety of lower rib cage 5-8 bilaterally, xiphoid process, sternum.  No gross deformity, no flail chest. Abdomen: Non distended.   Skin: Warm and dry; no focal rashes identified on limited exam. Extremities: No cyanosis or edema.  Neuro: Gross motor and sensory intact. Gait normal. Psychiatric: Mood and affect are appropriate.    Electronically signed by:  [name] D[facility] 4:29 PM 09/01/23

## 2023-08-31 NOTE — Patient Instructions (Signed)
 Patient is 48 yr old R handed female with hx of RA (dx'd 2019), breast CA- 2017 dx'd, and Fibromyalgia- secondary to breast cancer TREATMENT-- Sx's since 2019; and here for f/u of L>>>R Thoracic back pain.     Check TSH since started Synthroid 50 mcg 4 months ago- and make sure we have her on right dose. Once I get the lab today to look at your thyroid, I will change the thyroid medicine,Levothyroid/Synthroid, if needed. TSH around 1 is goal-    2. Will try to come off Keppra- on 500 mg 2x/day- reduce to 250 mg 2x/day- x 2 weeks- then just in case, will do 250 mg nightly x 2 weeks, then stop.    3. Call Psychiatrist and see if there's anything that they can do to help.  If your sleep isn't better, you won't- but if we get that under control, things will improve.  Call me if that becomes an issue.   4. Usually on Trazodone 100 mg nightly-  I would increase that short term-  I suggest adding 25 mg Benadryl nightly- to get to sleep short term.    5. TO REMIND YOU- you are doing GREAT- you deserveto ask for help!  6.   Was told no deep tissue- when I call you to discuss the thyroid, please double check with Dr Jean Rosenthal about myofascial release and dry needling from PT- which WOULD be covered by insurance- on full disability no no flex spending. .    7. Will wait to look into Tramadol, etc in the future-    8.   Might be worth talking to Gyn about HRT- hormone replacement therapy- I think its addressing issue more.    9. F/U in 3 months- I will call likely Wednesday about thyroid-   10. Call me in 1 month to let me know how things going-

## 2023-09-01 ENCOUNTER — Ambulatory Visit (INDEPENDENT_AMBULATORY_CARE_PROVIDER_SITE_OTHER): Payer: Medicare Other

## 2023-09-01 ENCOUNTER — Ambulatory Visit (INDEPENDENT_AMBULATORY_CARE_PROVIDER_SITE_OTHER): Payer: Medicare Other | Admitting: Sports Medicine

## 2023-09-01 VITALS — BP 134/82 | HR 105 | Ht 64.0 in | Wt 225.0 lb

## 2023-09-01 DIAGNOSIS — M546 Pain in thoracic spine: Secondary | ICD-10-CM | POA: Diagnosis not present

## 2023-09-01 DIAGNOSIS — Z853 Personal history of malignant neoplasm of breast: Secondary | ICD-10-CM | POA: Diagnosis not present

## 2023-09-01 DIAGNOSIS — G8929 Other chronic pain: Secondary | ICD-10-CM

## 2023-09-01 DIAGNOSIS — R0781 Pleurodynia: Secondary | ICD-10-CM

## 2023-09-01 DIAGNOSIS — M9902 Segmental and somatic dysfunction of thoracic region: Secondary | ICD-10-CM | POA: Diagnosis not present

## 2023-09-01 DIAGNOSIS — M9903 Segmental and somatic dysfunction of lumbar region: Secondary | ICD-10-CM

## 2023-09-01 DIAGNOSIS — M9901 Segmental and somatic dysfunction of cervical region: Secondary | ICD-10-CM | POA: Diagnosis not present

## 2023-09-01 DIAGNOSIS — M0579 Rheumatoid arthritis with rheumatoid factor of multiple sites without organ or systems involvement: Secondary | ICD-10-CM

## 2023-09-01 DIAGNOSIS — M9908 Segmental and somatic dysfunction of rib cage: Secondary | ICD-10-CM

## 2023-09-01 DIAGNOSIS — M9905 Segmental and somatic dysfunction of pelvic region: Secondary | ICD-10-CM

## 2023-09-01 NOTE — Patient Instructions (Addendum)
 Xrays on the way out  MRI ribs and thoracic  Follow up 5 days after to discuss results

## 2023-09-02 NOTE — Addendum Note (Signed)
 Addended by: Evon Slack on: 09/02/2023 10:13 AM   Modules accepted: Orders

## 2023-09-03 ENCOUNTER — Encounter: Payer: Self-pay | Admitting: Podiatry

## 2023-09-03 ENCOUNTER — Telehealth: Payer: Self-pay | Admitting: Physical Medicine and Rehabilitation

## 2023-09-03 ENCOUNTER — Ambulatory Visit (INDEPENDENT_AMBULATORY_CARE_PROVIDER_SITE_OTHER): Payer: Medicare Other | Admitting: Podiatry

## 2023-09-03 DIAGNOSIS — L603 Nail dystrophy: Secondary | ICD-10-CM

## 2023-09-03 LAB — TSH: TSH: 1.82 u[IU]/mL (ref 0.450–4.500)

## 2023-09-03 NOTE — Progress Notes (Signed)
 She presents today chief concern of painful hallux nails bilaterally she states that they seem to be really thick and ingrown she has had the left for work normal for her to have a margin removed.  She states that I just want to see if that were to make them any better.  Objective: I reviewed her past medical history medications allergies surgery social history.  Her toenails are thick yellow dystrophic clinically mycotic hallux bilaterally sharply abraded nail margins.  Assessment nail dystrophy hallux bilateral.  Plan: Took samples of the skin and nail to be sent for pathologic evaluation I will follow-up with her in 1 month

## 2023-09-07 MED ORDER — LEVOTHYROXINE SODIUM 75 MCG PO TABS: ORAL_TABLET | ORAL | 5 refills | Status: DC

## 2023-09-07 MED ORDER — LEVOTHYROXINE SODIUM 50 MCG PO TABS: 50.0000 ug | ORAL_TABLET | ORAL | 5 refills | Status: DC

## 2023-09-07 NOTE — Telephone Encounter (Signed)
 Will alternate 75 mcg and 50 mg- to try and get to around 1.0 TSH- as goal Therefore, this should help her TSH to be around 1- which helps pain issues frequently.   Will recheck labs at next visit- let pt know what I was doing and sent to her pharmacy of choice

## 2023-09-14 ENCOUNTER — Other Ambulatory Visit: Payer: Self-pay | Admitting: Podiatry

## 2023-09-21 ENCOUNTER — Encounter: Payer: Self-pay | Admitting: Sports Medicine

## 2023-09-24 ENCOUNTER — Other Ambulatory Visit: Payer: Self-pay | Admitting: Pharmacy Technician

## 2023-09-24 ENCOUNTER — Other Ambulatory Visit: Payer: Self-pay

## 2023-09-24 NOTE — Progress Notes (Signed)
 Disenrolled; Last filled 04/20/2023.

## 2023-09-25 ENCOUNTER — Encounter (HOSPITAL_COMMUNITY): Payer: Self-pay | Admitting: Student in an Organized Health Care Education/Training Program

## 2023-09-25 ENCOUNTER — Ambulatory Visit (HOSPITAL_COMMUNITY): Payer: Medicare Other | Admitting: Student in an Organized Health Care Education/Training Program

## 2023-09-25 VITALS — BP 129/82 | HR 73 | Wt 218.0 lb

## 2023-09-25 DIAGNOSIS — F5105 Insomnia due to other mental disorder: Secondary | ICD-10-CM

## 2023-09-25 DIAGNOSIS — F411 Generalized anxiety disorder: Secondary | ICD-10-CM

## 2023-09-25 DIAGNOSIS — F331 Major depressive disorder, recurrent, moderate: Secondary | ICD-10-CM

## 2023-09-25 DIAGNOSIS — F99 Mental disorder, not otherwise specified: Secondary | ICD-10-CM

## 2023-09-25 DIAGNOSIS — F410 Panic disorder [episodic paroxysmal anxiety] without agoraphobia: Secondary | ICD-10-CM

## 2023-09-25 DIAGNOSIS — F502 Bulimia nervosa, unspecified: Secondary | ICD-10-CM

## 2023-09-25 MED ORDER — HYDROXYZINE HCL 25 MG PO TABS: ORAL_TABLET | ORAL | 3 refills | Status: DC

## 2023-09-25 MED ORDER — DULOXETINE HCL 60 MG PO CPEP: 60.0000 mg | ORAL_CAPSULE | Freq: Two times a day (BID) | ORAL | 2 refills | Status: DC

## 2023-09-25 MED ORDER — TRAZODONE HCL 100 MG PO TABS: 100.0000 mg | ORAL_TABLET | Freq: Every day | ORAL | 2 refills | Status: DC

## 2023-09-25 NOTE — Addendum Note (Signed)
 Addended by: Eliseo Gum B on: 09/25/2023 02:09 PM   Modules accepted: Level of Service
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
## 2023-09-28 ENCOUNTER — Ambulatory Visit (INDEPENDENT_AMBULATORY_CARE_PROVIDER_SITE_OTHER)

## 2023-09-28 DIAGNOSIS — R0781 Pleurodynia: Secondary | ICD-10-CM | POA: Diagnosis not present

## 2023-09-28 DIAGNOSIS — M0579 Rheumatoid arthritis with rheumatoid factor of multiple sites without organ or systems involvement: Secondary | ICD-10-CM

## 2023-09-28 DIAGNOSIS — G8929 Other chronic pain: Secondary | ICD-10-CM

## 2023-09-28 DIAGNOSIS — Z853 Personal history of malignant neoplasm of breast: Secondary | ICD-10-CM

## 2023-09-28 DIAGNOSIS — M549 Dorsalgia, unspecified: Secondary | ICD-10-CM | POA: Diagnosis not present

## 2023-09-28 MED ORDER — GADOBUTROL 1 MMOL/ML IV SOLN
9.8000 mL | Freq: Once | INTRAVENOUS | Status: AC | PRN
  Administered 2023-09-28: 9.8 mL via INTRAVENOUS
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
## 2023-09-30 ENCOUNTER — Ambulatory Visit (INDEPENDENT_AMBULATORY_CARE_PROVIDER_SITE_OTHER): Payer: Medicare Other | Admitting: Neurology

## 2023-09-30 ENCOUNTER — Encounter: Payer: Self-pay | Admitting: Neurology

## 2023-09-30 VITALS — BP 137/84 | HR 96 | Ht 64.0 in | Wt 212.0 lb

## 2023-09-30 DIAGNOSIS — G43009 Migraine without aura, not intractable, without status migrainosus: Secondary | ICD-10-CM | POA: Diagnosis not present

## 2023-09-30 NOTE — Patient Instructions (Signed)
 Plan to start Nurtec 1 tablet every OTHER day Rizatriptan as needed.  Limit use of pain relievers to no more than 9 days out of month to prevent risk of rebound or medication-overuse headache. Taper off of Excedrin as discussed.

## 2023-10-01 ENCOUNTER — Ambulatory Visit: Payer: Medicare Other | Admitting: Podiatry

## 2023-10-29 ENCOUNTER — Ambulatory Visit (INDEPENDENT_AMBULATORY_CARE_PROVIDER_SITE_OTHER): Admitting: Sports Medicine

## 2023-10-29 DIAGNOSIS — Z853 Personal history of malignant neoplasm of breast: Secondary | ICD-10-CM

## 2023-10-29 DIAGNOSIS — M546 Pain in thoracic spine: Secondary | ICD-10-CM

## 2023-10-29 DIAGNOSIS — G8929 Other chronic pain: Secondary | ICD-10-CM

## 2023-10-29 DIAGNOSIS — R0781 Pleurodynia: Secondary | ICD-10-CM | POA: Diagnosis not present

## 2023-10-29 DIAGNOSIS — M0579 Rheumatoid arthritis with rheumatoid factor of multiple sites without organ or systems involvement: Secondary | ICD-10-CM

## 2023-10-29 NOTE — Progress Notes (Signed)
 Ben Destin Vinsant D.[facility] [address] [place] [address] Phone: [phone]    Virtual Visit Note  Assessment and Plan:     1. Chronic bilateral thoracic back pain 2. Rib pain 3. Rheumatoid arthritis involving multiple sites with positive rheumatoid factor (HCC) 4. History of breast cancer -Chronic with exacerbation, subsequent visit - Reviewed patient's MRIs including thoracic MRI and chest MRI which were largely unremarkable.  Mild degenerative changes at T6-T8 which may lead to intermittent back pain and associated rib dysfunction.  Also discussed lesion at T8 consistent with hemangioma/benign process based on appearance and no change since imaging in 2022.  No further imaging or workup necessary at this time - Recommend continuing HEP, neurology, PM&R, rheumatology follow-ups - May continue Tylenol  for day-to-day pain relief   I connected with patient by [employer] video enabled telemedicine application and verified that I am speaking with the correct person using two identifiers. Patient agreed to proceed via telephone. I discussed the limitations of evaluation and management by telemedicine and the availability of in person appointments. The patient expressed understanding and agreed to proceed.  Location: Patient: Work in [employer]: In office setting  Time of visit 14 minutes, which included telephone discussion, chart review, treatment plan discussion with patient, and documentation at today's telemedicine visit.    Pertinent previous records reviewed include thoracic and chest MRIs 10/28/2023  Follow Up: As needed to discuss patient's new lower back pain.  Could obtain lumbar x-ray and discuss new treatment plan.  Could consider repeat OMT   Subjective:    Chief Complaint: Continued upper back, rib pain.  New low back pain  HPI:   05/11/23 Patient is a 48 year old female that is here for OMT . patient  states that she has been having issues with her ribs. Has pain when she laughs , coughs or any twisting movement . She has extreme pain bilaterally . No MOI . Pain started around cancer treatments remission since 2018. Ibu and tylenol  do not help. She is able to reposition her self to help with the pain, and that will take the edge off. Hx of migraines. Pain does radiate to the back and under the rib cage in the front    06/26/2023 Patient states had improvement for a month then had a flare side stitch / that felt like everything shifted. She had a muscle spasm in her hand and forearm    09/01/2023 Patient states still has rib pain  10/29/23 Patient states she continues to have upper back pain radiating to ribs.  She also endorses new lower back pain with symptoms radiating into leg after new workouts over the past several weeks.  No specific MOI  Relevant Historical Information: Rheumatoid arthritis, breast cancer, fibromyalgia  Additional pertinent review of systems negative.   Current Outpatient Medications:    albuterol  (VENTOLIN  HFA) 108 (90 Base) MCG/ACT inhaler, Inhale into the lungs every 6 (six) hours as needed for wheezing or shortness of breath., Disp: , Rfl:    clobetasol  ointment (TEMOVATE ) 0.05 %, Apply 1 Application topically 2 (two) times daily. Apply as directed twice daily for up to 2 weeks as needed, Disp: 60 g, Rfl: 0   DULoxetine  (CYMBALTA ) 60 MG capsule, Take 1 capsule (60 mg total) by mouth 2 (two) times daily., Disp: 60 capsule, Rfl: 2   EPINEPHrine  0.3 mg/0.3 mL IJ SOAJ injection, Inject 0.3 mg into the muscle as needed for anaphylaxis., Disp: 1  each, Rfl: 1   hydrocortisone  (ANUSOL -HC) 25 MG suppository, Place 1 suppository (25 mg total) rectally 2 (two) times daily as needed for hemorrhoids or anal itching., Disp: 14 suppository, Rfl: 1   hydrOXYzine  (ATARAX ) 25 MG tablet, 1 tablet at bedtime as needed Orally Once a day for 30 day(s), Disp: 30 tablet, Rfl: 3    ibuprofen  (ADVIL ) 800 MG tablet, Take 800 mg by mouth every 8 (eight) hours as needed., Disp: , Rfl:    levETIRAcetam  (KEPPRA ) 250 MG tablet, Take 1 tablet (250 mg total) by mouth 2 (two) times daily. X 2 weeks,  then 500 mg BID- for nerve pain, Disp: 120 tablet, Rfl: 5   levothyroxine  (SYNTHROID ) 50 MCG tablet, Take 1 tablet (50 mcg total) by mouth every other day., Disp: 15 tablet, Rfl: 5   levothyroxine  (SYNTHROID ) 75 MCG tablet, Alternate 50 mg and 75 mg daily- for hypothyroidism, Disp: 15 tablet, Rfl: 5   methotrexate  (RHEUMATREX) 2.5 MG tablet, SMARTSIG:5 Tablet(s) By Mouth Once a Week, Disp: , Rfl:    omeprazole  (PRILOSEC) 40 MG capsule, Take 1 capsule (40 mg total) by mouth 2 (two) times daily. Take in the morning and at dinnertime., Disp: 60 capsule, Rfl: 11   rizatriptan  (MAXALT ) 10 MG tablet, 1 tablet Orally Once a day for 1 day(s), Disp: , Rfl:    traZODone  (DESYREL ) 100 MG tablet, Take 1 tablet (100 mg total) by mouth at bedtime., Disp: 30 tablet, Rfl: 2   triamcinolone  cream (KENALOG ) 0.1 %, Apply topically 2 (two) times daily., Disp: 30 g, Rfl: 0   Vitamin D , Ergocalciferol , (DRISDOL ) 1.25 MG (50000 UNIT) CAPS capsule, , Disp: , Rfl:    Objective:    Alert and doing well.  Very pleasant over the phone  Electronically signed by:  Marshall Skeeter D.[facility] 11:42 AM 10/29/23

## 2023-11-06 ENCOUNTER — Ambulatory Visit (HOSPITAL_COMMUNITY): Admitting: Student in an Organized Health Care Education/Training Program

## 2023-11-06 VITALS — BP 121/82 | HR 87 | Wt 209.0 lb

## 2023-11-06 DIAGNOSIS — F411 Generalized anxiety disorder: Secondary | ICD-10-CM

## 2023-11-06 DIAGNOSIS — F410 Panic disorder [episodic paroxysmal anxiety] without agoraphobia: Secondary | ICD-10-CM | POA: Diagnosis not present

## 2023-11-06 DIAGNOSIS — F331 Major depressive disorder, recurrent, moderate: Secondary | ICD-10-CM | POA: Diagnosis not present

## 2023-11-06 MED ORDER — DULOXETINE HCL 60 MG PO CPEP: 60.0000 mg | ORAL_CAPSULE | Freq: Two times a day (BID) | ORAL | 2 refills | Status: DC

## 2023-11-06 MED ORDER — PROPRANOLOL HCL 20 MG PO TABS: 20.0000 mg | ORAL_TABLET | Freq: Two times a day (BID) | ORAL | 0 refills | Status: DC

## 2023-11-06 MED ORDER — HYDROXYZINE HCL 25 MG PO TABS: ORAL_TABLET | ORAL | 3 refills | Status: DC

## 2023-11-06 MED ORDER — TRAZODONE HCL 50 MG PO TABS: 50.0000 mg | ORAL_TABLET | Freq: Every evening | ORAL | 1 refills | Status: DC | PRN
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
## 2023-11-06 NOTE — Patient Instructions (Addendum)
 Medication Changes - Continue Cymbalta  60 mg twice daily - Decrease Trazodone  to 50 mg nightly - Continue hydroxyzine  25 mg 3 times daily as needed -- Start Propanolol 20mg  bid -- Recommend starting Over the counter Magnesium  glycinate  Plan:  Address multiple medications that could increase Serotonin sensitivity and availability We will start Propanolol which can help decrease anxiety and migraines without increase Serotonin sensitivity and availability. Thus decreasing reliance on Rizatriptan   F/u in 3 weeks to make sure tolerating propanolol well  If doing well will try long acting once/ day dosing  2. If doing well but still having depression and disordered eating habits, plan to decrease Cymbalta  and start LOW dose Prozac

## 2023-11-09 ENCOUNTER — Telehealth (HOSPITAL_COMMUNITY): Payer: Self-pay

## 2023-11-09 NOTE — Telephone Encounter (Signed)
 Hello,   Pt/ Pharmacy is requesting for a refill of Propanol 20 mg to be sent in.    JNL

## 2023-11-09 NOTE — Progress Notes (Unsigned)
 Ben Jackson D.[facility] [address] [place] [address] Phone: [phone]   Assessment and Plan:     There are no diagnoses linked to this encounter.  ***   Pertinent previous records reviewed include ***    Follow Up: ***     Subjective:   I, Mariko Nowakowski, am serving as a Neurosurgeon for [facility] [employer]  Chief Complaint: Continued upper back, rib pain.  New low back pain   HPI:    05/11/23 Patient is a 48 year old female that is here for OMT . patient states that she has been having issues with her ribs. Has pain when she laughs , coughs or any twisting movement . She has extreme pain bilaterally . No MOI . Pain started around cancer treatments remission since 2018. Ibu and tylenol  do not help. She is able to reposition her self to help with the pain, and that will take the edge off. Hx of migraines. Pain does radiate to the back and under the rib cage in the front    06/26/2023 Patient states had improvement for a month then had a flare side stitch / that felt like everything shifted. She had a muscle spasm in her hand and forearm    09/01/2023 Patient states still has rib pain   10/29/23 Patient states she continues to have upper back pain radiating to ribs.  She also endorses new lower back pain with symptoms radiating into leg after new workouts over the past several weeks.  No specific MOI  11/10/2023 Patient states  Relevant Historical Information: Rheumatoid arthritis, breast cancer, fibromyalgia  Additional pertinent review of systems negative.   Current Outpatient Medications:    albuterol  (VENTOLIN  HFA) 108 (90 Base) MCG/ACT inhaler, Inhale into the lungs every 6 (six) hours as needed for wheezing or shortness of breath., Disp: , Rfl:    clobetasol  ointment (TEMOVATE ) 0.05 %, Apply 1 Application topically 2 (two) times daily. Apply as directed twice daily for up to 2 weeks as needed, Disp: 60 g, Rfl: 0    DULoxetine  (CYMBALTA ) 60 MG capsule, Take 1 capsule (60 mg total) by mouth 2 (two) times daily., Disp: 60 capsule, Rfl: 2   EPINEPHrine  0.3 mg/0.3 mL IJ SOAJ injection, Inject 0.3 mg into the muscle as needed for anaphylaxis., Disp: 1 each, Rfl: 1   hydrocortisone  (ANUSOL -HC) 25 MG suppository, Place 1 suppository (25 mg total) rectally 2 (two) times daily as needed for hemorrhoids or anal itching., Disp: 14 suppository, Rfl: 1   hydrOXYzine  (ATARAX ) 25 MG tablet, 1 tablet at bedtime as needed Orally Once a day for 30 day(s), Disp: 30 tablet, Rfl: 3   ibuprofen  (ADVIL ) 800 MG tablet, Take 800 mg by mouth every 8 (eight) hours as needed., Disp: , Rfl:    levETIRAcetam  (KEPPRA ) 250 MG tablet, Take 1 tablet (250 mg total) by mouth 2 (two) times daily. X 2 weeks,  then 500 mg BID- for nerve pain, Disp: 120 tablet, Rfl: 5   levothyroxine  (SYNTHROID ) 50 MCG tablet, Take 1 tablet (50 mcg total) by mouth every other day., Disp: 15 tablet, Rfl: 5   levothyroxine  (SYNTHROID ) 75 MCG tablet, Alternate 50 mg and 75 mg daily- for hypothyroidism, Disp: 15 tablet, Rfl: 5   methotrexate  (RHEUMATREX) 2.5 MG tablet, SMARTSIG:5 Tablet(s) By Mouth Once a Week, Disp: , Rfl:    omeprazole  (PRILOSEC) 40 MG capsule, Take 1 capsule (40 mg total) by mouth 2 (two) times daily. Take in  the morning and at dinnertime., Disp: 60 capsule, Rfl: 11   propranolol  (INDERAL ) 20 MG tablet, Take 1 tablet (20 mg total) by mouth 2 (two) times daily., Disp: 60 tablet, Rfl: 0   rizatriptan  (MAXALT ) 10 MG tablet, 1 tablet Orally Once a day for 1 day(s), Disp: , Rfl:    traZODone  (DESYREL ) 50 MG tablet, Take 1 tablet (50 mg total) by mouth at bedtime as needed for sleep., Disp: 30 tablet, Rfl: 1   triamcinolone  cream (KENALOG ) 0.1 %, Apply topically 2 (two) times daily., Disp: 30 g, Rfl: 0   Vitamin D , Ergocalciferol , (DRISDOL ) 1.25 MG (50000 UNIT) CAPS capsule, , Disp: , Rfl:    Objective:     There were no vitals filed for this visit.     There is no height or weight on file to calculate BMI.    Physical Exam:    ***   Electronically signed by:  Marshall Skeeter D.[facility] 3:08 PM 11/09/23

## 2023-11-10 ENCOUNTER — Ambulatory Visit: Admitting: Sports Medicine

## 2023-11-10 ENCOUNTER — Ambulatory Visit: Admitting: Podiatry

## 2023-11-10 ENCOUNTER — Ambulatory Visit (INDEPENDENT_AMBULATORY_CARE_PROVIDER_SITE_OTHER)

## 2023-11-10 VITALS — BP 120/80 | HR 97 | Ht 64.0 in | Wt 209.0 lb

## 2023-11-10 DIAGNOSIS — G8929 Other chronic pain: Secondary | ICD-10-CM

## 2023-11-10 DIAGNOSIS — M545 Low back pain, unspecified: Secondary | ICD-10-CM

## 2023-11-10 NOTE — Patient Instructions (Signed)
 Low back HEP  Tylenol  [phone] mg 2-3 times a day for pain relief  Voltaren  gel over areas of pain  PT referral  4-6 week follow up

## 2023-11-11 ENCOUNTER — Encounter: Payer: Self-pay | Admitting: Sports Medicine

## 2023-11-11 NOTE — Telephone Encounter (Signed)
 Ok, I am not sure why, she just got the first rx on 11/06/2023. I called and they said it was an error.

## 2023-11-27 ENCOUNTER — Encounter (HOSPITAL_COMMUNITY): Admitting: Student in an Organized Health Care Education/Training Program

## 2023-11-27 ENCOUNTER — Encounter: Payer: Medicare Other | Admitting: Physical Medicine and Rehabilitation

## 2023-12-01 ENCOUNTER — Ambulatory Visit (HOSPITAL_COMMUNITY): Admitting: Clinical

## 2023-12-14 ENCOUNTER — Telehealth (HOSPITAL_COMMUNITY): Payer: Self-pay

## 2023-12-14 NOTE — Telephone Encounter (Signed)
 Hello,   Pt/ Pharmacy is requesting for a refill of Propanol 20 mg to be sent in.    JNL

## 2023-12-15 ENCOUNTER — Other Ambulatory Visit: Payer: Self-pay | Admitting: Student in an Organized Health Care Education/Training Program

## 2023-12-15 ENCOUNTER — Other Ambulatory Visit (HOSPITAL_COMMUNITY): Payer: Self-pay | Admitting: Student in an Organized Health Care Education/Training Program

## 2023-12-15 DIAGNOSIS — F410 Panic disorder [episodic paroxysmal anxiety] without agoraphobia: Secondary | ICD-10-CM

## 2023-12-15 MED ORDER — PROPRANOLOL HCL 20 MG PO TABS: 20.0000 mg | ORAL_TABLET | Freq: Two times a day (BID) | ORAL | 2 refills | Status: DC

## 2023-12-15 NOTE — Telephone Encounter (Signed)
 Done. thanks

## 2023-12-17 ENCOUNTER — Encounter: Payer: Self-pay | Admitting: Podiatry

## 2023-12-17 ENCOUNTER — Ambulatory Visit: Admitting: Podiatry

## 2023-12-17 DIAGNOSIS — L603 Nail dystrophy: Secondary | ICD-10-CM | POA: Diagnosis not present

## 2023-12-17 MED ORDER — TERBINAFINE HCL 250 MG PO TABS: 250.0000 mg | ORAL_TABLET | Freq: Every day | ORAL | 0 refills | Status: AC

## 2023-12-17 NOTE — Progress Notes (Signed)
 She presents today for follow-up of her nail pathology report.  Objective: No change in physical exam however the nail pathology does demonstrate nail dystrophy dermatophytes and yeast.  Assessment: Nail dystrophy dermatophytes and yeast with onychomycosis.  Plan: We discussed the use of topical therapy oral therapy and laser therapy.  At this point she would like to try oral therapy.  So we started her on Lamisil 250 mg tablets.  She will take 1 tablet daily.  She will take these for the next 30 days and we will follow-up with her at that time for assessment and for a comprehensive metabolic panel.

## 2023-12-17 NOTE — Progress Notes (Signed)
 Ben Tatyanna Cronk D.[facility] [address] [place] [address] Phone: [phone]   Assessment and Plan:     1. Chronic bilateral low back pain without sciatica (Primary) 2. Chronic bilateral thoracic back pain 3. Somatic dysfunction of thoracic region 4. Somatic dysfunction of lumbar region 5. Somatic dysfunction of rib region -Chronic with exacerbation, subsequent visit - Still consistent with musculoskeletal dysfunction primarily through the thoracolumbar region radiating up around rib cage causing anterior rib cage pain and lower sternal discomfort - Patient has noticed some benefit with HEP, but has been doing HEP intermittently.  Recommend increasing frequency of HEP - Recommend reaching out to physical therapy to establish care - Patient was on prolonged NSAIDs in the past, so recommend continuing to avoid NSAIDs at this time - Start Tylenol  500 to 1000 mg tablets 2-3 times a day for day-to-day pain relief as needed - Patient elected for epidural CSI to left-sided T6-7.  Goal of decreasing back pain and radicular pain into anterior rib cage and sternum - Patient has a history of binge eating episodes, so do not recommend prednisone  as this could trigger additional episodes - Likely a behavioral health component contributing to patient's symptoms.  Patient has increased Cymbalta  and started propranolol  which I do believe will be helpful in decreasing chronic pain.  Continue follow-up with behavioral health  - Patient has received relief with OMT in the past.  Elects for repeat OMT today.  Tolerated well per note below. - Decision today to treat with OMT was based on Physical Exam  After verbal consent patient was treated with HVLA (high velocity low amplitude), ME (muscle energy), FPR (flex positional release), ST (soft tissue),  techniques in  , rib, thoracic, lumbar, and  areas. Patient tolerated the procedure well with improvement in  symptoms.  Patient educated on potential side effects of soreness and recommended to rest, hydrate, and use Tylenol  as needed for pain control.    Pertinent previous records reviewed include none  Follow Up: 2 weeks after epidural CSI.  Could consider repeat OMT   Subjective:   I, Adrienne Horning, am serving as a scribe for Dr. Ulysees Gander  Chief Complaint: upper and lower back pain   HPI:   05/11/23 Patient is a 48 year old female that is here for OMT . patient states that she has been having issues with her ribs. Has pain when she laughs , coughs or any twisting movement . She has extreme pain bilaterally . No MOI . Pain started around cancer treatments remission since 2018. Ibu and tylenol  do not help. She is able to reposition her self to help with the pain, and that will take the edge off. Hx of migraines. Pain does radiate to the back and under the rib cage in the front    06/26/2023 Patient states had improvement for a month then had a flare side stitch / that felt like everything shifted. She had a muscle spasm in her hand and forearm    09/01/2023 Patient states still has rib pain   10/29/23 Patient states she continues to have upper back pain radiating to ribs.  She also endorses new lower back pain with symptoms radiating into leg after new workouts over the past several weeks.  No specific MOI   11/10/2023 Patient states low back pain has increased since last month   12/18/23 Patient states that the exercises has helped some, does has pain still with a good laugh or  bending or twisting certain ways. Was doing well with yoga.   Relevant Historical Information: Rheumatoid arthritis, breast cancer, fibromyalgia   Additional pertinent review of systems negative.   Current Outpatient Medications:    albuterol  (VENTOLIN  HFA) 108 (90 Base) MCG/ACT inhaler, Inhale into the lungs every 6 (six) hours as needed for wheezing or shortness of breath., Disp: , Rfl:    clobetasol   ointment (TEMOVATE ) 0.05 %, Apply 1 Application topically 2 (two) times daily. Apply as directed twice daily for up to 2 weeks as needed, Disp: 60 g, Rfl: 0   DULoxetine  (CYMBALTA ) 60 MG capsule, Take 1 capsule (60 mg total) by mouth 2 (two) times daily., Disp: 60 capsule, Rfl: 2   EPINEPHrine  0.3 mg/0.3 mL IJ SOAJ injection, Inject 0.3 mg into the muscle as needed for anaphylaxis., Disp: 1 each, Rfl: 1   hydrocortisone  (ANUSOL -HC) 25 MG suppository, Place 1 suppository (25 mg total) rectally 2 (two) times daily as needed for hemorrhoids or anal itching., Disp: 14 suppository, Rfl: 1   hydrOXYzine  (ATARAX ) 25 MG tablet, 1 tablet at bedtime as needed Orally Once a day for 30 day(s), Disp: 30 tablet, Rfl: 3   ibuprofen  (ADVIL ) 800 MG tablet, Take 800 mg by mouth every 8 (eight) hours as needed., Disp: , Rfl:    levETIRAcetam  (KEPPRA ) 250 MG tablet, Take 1 tablet (250 mg total) by mouth 2 (two) times daily. X 2 weeks,  then 500 mg BID- for nerve pain, Disp: 120 tablet, Rfl: 5   levothyroxine  (SYNTHROID ) 50 MCG tablet, Take 1 tablet (50 mcg total) by mouth every other day., Disp: 15 tablet, Rfl: 5   levothyroxine  (SYNTHROID ) 75 MCG tablet, Alternate 50 mg and 75 mg daily- for hypothyroidism, Disp: 15 tablet, Rfl: 5   methotrexate  (RHEUMATREX) 2.5 MG tablet, SMARTSIG:5 Tablet(s) By Mouth Once a Week, Disp: , Rfl:    omeprazole  (PRILOSEC) 40 MG capsule, Take 1 capsule (40 mg total) by mouth 2 (two) times daily. Take in the morning and at dinnertime., Disp: 60 capsule, Rfl: 11   propranolol  (INDERAL ) 20 MG tablet, Take 1 tablet (20 mg total) by mouth 2 (two) times daily., Disp: 60 tablet, Rfl: 2   rizatriptan  (MAXALT ) 10 MG tablet, 1 tablet Orally Once a day for 1 day(s), Disp: , Rfl:    terbinafine  (LAMISIL ) 250 MG tablet, Take 1 tablet (250 mg total) by mouth daily., Disp: 30 tablet, Rfl: 0   traZODone  (DESYREL ) 50 MG tablet, Take 1 tablet (50 mg total) by mouth at bedtime as needed for sleep., Disp: 30  tablet, Rfl: 1   triamcinolone  cream (KENALOG ) 0.1 %, Apply topically 2 (two) times daily., Disp: 30 g, Rfl: 0   Vitamin D , Ergocalciferol , (DRISDOL ) 1.25 MG (50000 UNIT) CAPS capsule, , Disp: , Rfl:    Objective:     Vitals:   12/18/23 1003  BP: 130/80  Pulse: 80  SpO2: 98%  Height: 5' 4 (1.626 m)      Body mass index is 35.87 kg/m.    Physical Exam:    General: Well-appearing, cooperative, sitting comfortably in no acute distress.   OMT Physical Exam:    Rib: Decreased motion of inferior rib cage bilaterally Thoracic: TTP paraspinal, T4 RRSR, T8-11 RRSL Lumbar: TTP paraspinal, L1-3 RRSL     Electronically signed by:  Marshall Skeeter D.[facility] 11:01 AM 12/18/23

## 2023-12-18 ENCOUNTER — Ambulatory Visit (INDEPENDENT_AMBULATORY_CARE_PROVIDER_SITE_OTHER): Admitting: Sports Medicine

## 2023-12-18 VITALS — BP 130/80 | HR 80 | Ht 64.0 in

## 2023-12-18 DIAGNOSIS — M9903 Segmental and somatic dysfunction of lumbar region: Secondary | ICD-10-CM | POA: Diagnosis not present

## 2023-12-18 DIAGNOSIS — G8929 Other chronic pain: Secondary | ICD-10-CM

## 2023-12-18 DIAGNOSIS — M546 Pain in thoracic spine: Secondary | ICD-10-CM | POA: Diagnosis not present

## 2023-12-18 DIAGNOSIS — M545 Low back pain, unspecified: Secondary | ICD-10-CM

## 2023-12-18 DIAGNOSIS — M9902 Segmental and somatic dysfunction of thoracic region: Secondary | ICD-10-CM | POA: Diagnosis not present

## 2023-12-18 DIAGNOSIS — M9908 Segmental and somatic dysfunction of rib cage: Secondary | ICD-10-CM

## 2023-12-18 NOTE — Patient Instructions (Addendum)
 Good to see you  Recommend to reach out to PT to establish care Epidural ordered L-T6-T7 call [phone] to schedule  Follow up 2 weeks after epidural

## 2024-01-11 NOTE — Discharge Instructions (Signed)

## 2024-01-15 ENCOUNTER — Telehealth: Payer: Self-pay | Admitting: Podiatry

## 2024-01-15 NOTE — Telephone Encounter (Signed)
 LVM 2x (7/10 and 7/11) to reschedule 7/22 appt due to provider OOF. Mychart message sent to pt.

## 2024-01-25 ENCOUNTER — Ambulatory Visit
Admission: RE | Admit: 2024-01-25 | Discharge: 2024-01-25 | Disposition: A | Discharge status: Home / Self Care | Source: Ambulatory Visit | Attending: Sports Medicine | Admitting: Sports Medicine

## 2024-01-25 DIAGNOSIS — G8929 Other chronic pain: Secondary | ICD-10-CM

## 2024-01-25 DIAGNOSIS — M9908 Segmental and somatic dysfunction of rib cage: Secondary | ICD-10-CM

## 2024-01-25 DIAGNOSIS — M9902 Segmental and somatic dysfunction of thoracic region: Secondary | ICD-10-CM

## 2024-01-25 MED ORDER — TRIAMCINOLONE ACETONIDE 40 MG/ML IJ SUSP (RADIOLOGY): 60.0000 mg | Freq: Once | INTRAMUSCULAR | Status: DC

## 2024-01-25 MED ORDER — IOPAMIDOL (ISOVUE-M 300) INJECTION 61%: 1.0000 mL | Freq: Once | INTRAMUSCULAR | Status: DC

## 2024-01-26 ENCOUNTER — Ambulatory Visit (INDEPENDENT_AMBULATORY_CARE_PROVIDER_SITE_OTHER): Admitting: Internal Medicine

## 2024-01-26 ENCOUNTER — Ambulatory Visit: Admitting: Podiatry

## 2024-01-26 VITALS — BP 124/74 | HR 70 | Temp 98.1°F | Ht 64.0 in | Wt 202.2 lb

## 2024-01-26 DIAGNOSIS — E559 Vitamin D deficiency, unspecified: Secondary | ICD-10-CM | POA: Diagnosis not present

## 2024-01-26 DIAGNOSIS — E038 Other specified hypothyroidism: Secondary | ICD-10-CM

## 2024-01-26 DIAGNOSIS — M797 Fibromyalgia: Secondary | ICD-10-CM

## 2024-01-26 DIAGNOSIS — F4310 Post-traumatic stress disorder, unspecified: Secondary | ICD-10-CM

## 2024-01-26 DIAGNOSIS — M05731 Rheumatoid arthritis with rheumatoid factor of right wrist without organ or systems involvement: Secondary | ICD-10-CM

## 2024-01-26 DIAGNOSIS — G43711 Chronic migraine without aura, intractable, with status migrainosus: Secondary | ICD-10-CM | POA: Diagnosis not present

## 2024-01-26 DIAGNOSIS — F411 Generalized anxiety disorder: Secondary | ICD-10-CM | POA: Diagnosis not present

## 2024-01-26 DIAGNOSIS — M05732 Rheumatoid arthritis with rheumatoid factor of left wrist without organ or systems involvement: Secondary | ICD-10-CM

## 2024-01-26 DIAGNOSIS — F332 Major depressive disorder, recurrent severe without psychotic features: Secondary | ICD-10-CM

## 2024-01-26 DIAGNOSIS — E034 Atrophy of thyroid (acquired): Secondary | ICD-10-CM | POA: Diagnosis not present

## 2024-01-26 DIAGNOSIS — F410 Panic disorder [episodic paroxysmal anxiety] without agoraphobia: Secondary | ICD-10-CM

## 2024-01-26 MED ORDER — LEVOTHYROXINE SODIUM 75 MCG PO TABS: 75.0000 ug | ORAL_TABLET | Freq: Every day | ORAL | 1 refills | Status: AC

## 2024-01-26 MED ORDER — VITAMIN D (ERGOCALCIFEROL) 1.25 MG (50000 UNIT) PO CAPS: 50000.0000 [IU] | ORAL_CAPSULE | ORAL | 1 refills | Status: AC

## 2024-01-26 NOTE — Patient Instructions (Signed)
 Schedule lab work appointment - no fasting needed. Drink plenty of water
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
## 2024-01-27 ENCOUNTER — Inpatient Hospital Stay: Admission: RE | Admit: 2024-01-27 | Source: Ambulatory Visit

## 2024-01-27 ENCOUNTER — Other Ambulatory Visit

## 2024-01-28 ENCOUNTER — Encounter (HOSPITAL_COMMUNITY): Admitting: Student in an Organized Health Care Education/Training Program

## 2024-02-02 DIAGNOSIS — E034 Atrophy of thyroid (acquired): Secondary | ICD-10-CM | POA: Insufficient documentation

## 2024-02-12 NOTE — Discharge Instructions (Signed)

## 2024-02-15 ENCOUNTER — Ambulatory Visit
Admission: RE | Admit: 2024-02-15 | Discharge: 2024-02-15 | Disposition: A | Discharge status: Home / Self Care | Source: Ambulatory Visit | Attending: Sports Medicine

## 2024-02-15 MED ORDER — TRIAMCINOLONE ACETONIDE 40 MG/ML IJ SUSP (RADIOLOGY)
60.0000 mg | Freq: Once | INTRAMUSCULAR | Status: AC
  Administered 2024-02-15 (×2): 60 mg via EPIDURAL

## 2024-02-15 MED ORDER — IOPAMIDOL (ISOVUE-M 300) INJECTION 61%
1.0000 mL | Freq: Once | INTRAMUSCULAR | Status: AC | PRN
  Administered 2024-02-15 (×2): 1 mL via EPIDURAL

## 2024-02-16 ENCOUNTER — Ambulatory Visit: Admitting: Nurse Practitioner
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
## 2024-02-18 ENCOUNTER — Ambulatory Visit (INDEPENDENT_AMBULATORY_CARE_PROVIDER_SITE_OTHER): Admitting: Student in an Organized Health Care Education/Training Program

## 2024-02-18 VITALS — BP 156/96 | HR 72 | Ht 64.0 in | Wt 198.5 lb

## 2024-02-18 DIAGNOSIS — F410 Panic disorder [episodic paroxysmal anxiety] without agoraphobia: Secondary | ICD-10-CM

## 2024-02-18 DIAGNOSIS — F331 Major depressive disorder, recurrent, moderate: Secondary | ICD-10-CM | POA: Diagnosis not present

## 2024-02-18 DIAGNOSIS — F4312 Post-traumatic stress disorder, chronic: Secondary | ICD-10-CM

## 2024-02-18 DIAGNOSIS — F515 Nightmare disorder: Secondary | ICD-10-CM

## 2024-02-18 DIAGNOSIS — F411 Generalized anxiety disorder: Secondary | ICD-10-CM | POA: Diagnosis not present

## 2024-02-18 MED ORDER — PRAZOSIN HCL 1 MG PO CAPS: 1.0000 mg | ORAL_CAPSULE | Freq: Every day | ORAL | 1 refills | Status: DC

## 2024-02-18 MED ORDER — TRAZODONE HCL 50 MG PO TABS: 50.0000 mg | ORAL_TABLET | Freq: Every evening | ORAL | 1 refills | Status: DC | PRN

## 2024-02-18 MED ORDER — BUSPIRONE HCL 5 MG PO TABS: 5.0000 mg | ORAL_TABLET | Freq: Three times a day (TID) | ORAL | 1 refills | Status: DC

## 2024-02-18 MED ORDER — HYDROXYZINE HCL 25 MG PO TABS: ORAL_TABLET | ORAL | 3 refills | Status: DC

## 2024-02-18 MED ORDER — DULOXETINE HCL 60 MG PO CPEP: 60.0000 mg | ORAL_CAPSULE | Freq: Two times a day (BID) | ORAL | 2 refills | Status: DC

## 2024-02-18 NOTE — Patient Instructions (Signed)
 Dear [name],  Most effective treatment for your mental health disease involves BOTH a psychiatrist AND a therapist Psychiatrist to manage medications Therapist to help identify personal goals, barriers from those goals, and plan to achieve those goals by understanding emotions Please make regular appointments with an outpatient psychiatrist and other doctors once you leave the hospital (if any, otherwise, please see below for resources to make an appointment).  For therapy outside the hospital, please ask for these specific types of therapy: DBT ________________________________________________________  SAFETY CRISIS  Dial 988 for National Suicide & Crisis Lifeline    Text (682)[phone] for Crisis Text Line:     [facility] U[facility]ENT CARE:  [address] FIRST FLOOR.  [place], [place] [address].  [phone]  Mobile Crisis Response Teams Listed by counties in vicinity of [facility] providers [facility] [phone] [facility] [phone] [facility] [phone] [facility] [phone] [place]                * [facility] [phone]                * [facility] [phone] [facility] [phone] [facility]  [phone] * [facility] [phone] ________________________________________________________  To see which pharmacy near you is the CHEAPEST for certain medications, please use GoodRx. It is free website and has a free phone app.    Also consider looking at [facility] $4.00 or [facility]'s $7.00 prescription list. Both are free to view if googled [facility]4 prescription and [facility]'s $7 prescription. These are set prices, no insurance required. [facility] low cost medications: $4-$15 for 30days  prescriptions or $10-$38 for 90days prescriptions  ________________________________________________________  Difficulties with sleep?   Can also use this free app for insomnia called CBT-I. Let your doctors and therapists know so they can help with extra tips and tricks or for guidance and accountability. NO ADDS on the app.     ________________________________________________________  Non-Emergent / Urgent  [facility] [address], SECOND FLOOR [place], [place] [address] [phone] OUTPATIENT Walk-in information: Please note, all walk-ins are first come & first serve, with limited number of availability.  Please note that to be eligible for services you must bring: ID or a piece of mail with your name [facility] address  Therapist for therapy:  Monday & Wednesdays: Please ARRIVE at 7:15 AM for registration Will START at 8:00 AM Every 1st & 2nd Friday of the month: Please ARRIVE at 10:15 AM for registration Will START at 1 PM - 5 PM  Psychiatrist for medication management: Monday - Friday:  Please ARRIVE at 7:15 AM for registration Will START at 8:00 AM  Regretfully, due to limited availability, please be aware that you may not been seen on the same day as walk-in. Please consider making an appoint or try again. Thank you for your patience and understanding.

## 2024-02-19 ENCOUNTER — Telehealth (HOSPITAL_COMMUNITY): Payer: Self-pay

## 2024-02-19 ENCOUNTER — Encounter: Attending: Physical Medicine and Rehabilitation | Admitting: Physical Medicine and Rehabilitation

## 2024-02-19 ENCOUNTER — Encounter: Payer: Self-pay | Admitting: Physical Medicine and Rehabilitation

## 2024-02-19 VITALS — BP 125/85 | HR 77 | Ht 64.0 in | Wt 199.2 lb

## 2024-02-19 DIAGNOSIS — E034 Atrophy of thyroid (acquired): Secondary | ICD-10-CM | POA: Diagnosis present

## 2024-02-19 DIAGNOSIS — M05732 Rheumatoid arthritis with rheumatoid factor of left wrist without organ or systems involvement: Secondary | ICD-10-CM | POA: Diagnosis present

## 2024-02-19 DIAGNOSIS — G894 Chronic pain syndrome: Secondary | ICD-10-CM | POA: Diagnosis present

## 2024-02-19 DIAGNOSIS — M7918 Myalgia, other site: Secondary | ICD-10-CM | POA: Insufficient documentation

## 2024-02-19 DIAGNOSIS — M05731 Rheumatoid arthritis with rheumatoid factor of right wrist without organ or systems involvement: Secondary | ICD-10-CM | POA: Diagnosis not present

## 2024-02-19 DIAGNOSIS — G43709 Chronic migraine without aura, not intractable, without status migrainosus: Secondary | ICD-10-CM | POA: Insufficient documentation

## 2024-02-19 NOTE — Telephone Encounter (Signed)
 I called the patient this morning about her Genesight testing. When she was here yesterday there was no one that knew the genesight process. Patient was given the folder to take home. I left a voicemail message letting her know that she can bring the folder back to the clinic and we will perform the test. I gave her the number to call back with any questions.

## 2024-02-19 NOTE — Patient Instructions (Signed)
 Patient is 48 yr old R handed female with hx of RA (dx'd 2019), breast CA- 2017 dx'd, and Fibromyalgia- secondary to breast cancer TREATMENT-- Sx's since 2019; and here for f/u of L>>>R Thoracic back pain.    I suggest a Change to ampitripyline 25-50 mg at bedtime- might do better than Trazodone  for sleep AND could help back pain as well.   2.  Needs to get TSH- thyroid lab done- to make sure you are keeping your TSH ~ 1- is the goal to help thyroid AND pain.   3.  Cannot have HRT since had breast cancer.    4.  Thyroid can play a big role in your depression- and need to make sure you get thyroid labs done.   5. IL-6 is inflammatory marker- gets activated by the flu or COVID-  a new research study that shows you increase risk of breast cancer recurrence for 365 days.    6. Get your COVID vaccine and flu shot to reduce chance of getting them  7. F/U - 6 months- on chronic pain  8. Cont duloxetine  for nerve pain
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
## 2024-02-24 ENCOUNTER — Ambulatory Visit: Admitting: Nurse Practitioner

## 2024-02-26 ENCOUNTER — Ambulatory Visit: Admitting: Nurse Practitioner

## 2024-03-25 NOTE — Progress Notes (Signed)
 Ben Fortune Torosian D.[facility]MENT[facility] [address] [place] [address] Phone: [phone]   Assessment and Plan:     1. Chronic bilateral low back pain without sciatica (Primary) 2. Chronic bilateral thoracic back pain 3. Rib pain 4. Fibromyalgia -Chronic with exacerbation, subsequent visit - Patient still experiencing musculoskeletal back pain.  Currently pain is more consistent and low back and less consistent in upper back.  Unclear etiology of ongoing pain, but musculoskeletal dysfunction, fibromyalgia are most likely differentials - Patient noticed minimal benefit after epidural CSI to thoracic spine in August 2025, stating 20% improvement, however localized pain/sensation since injection.  No significant findings on thoracic MRI besides mild degenerative changes, so do not recommend additional epidural at this time - I again recommend physical therapy.  Will resend physical therapy referral.  Patient has not established care yet - Continue HEP - Patient was previously on prolonged NSAID course.  Due to side effects, do not recommend prolonged NSAIDs - Use Tylenol  500 to 1000 mg tablets 2-3 times a day for day-to-day pain relief - Patient has history of binge eating episodes, so do not recommend prednisone  as this could trigger likely additional episodes - Continue Cymbalta  for chronic pain, fibromyalgia.  Continue follow-up with behavioral health - In the past, rheumatoid arthritis has been mentioned in chart. workup in 02/11/2017 with negative ANA, negative RF, negative CCP. her vitamin D  was low at 17, did have elevated inflammatory marker with sedimentation rate 56, CRP 6.9. CK was mildly elevated at 258, no proximal weakness. AVISE test in 01/2018: + ANA but ENA and ds DNA negative.  ANA in 2020 was negative.  I do not see diagnostic criteria for rheumatoid arthritis. -Start gabapentin  100 mg nightly.  If well-tolerated, could increase to gabapentin  200 mg nightly  after 1 week.  Pertinent previous records reviewed include epidural procedure note 02/2024, autoimmune workup from 2018 and 2020   Follow Up: 4 to 6 weeks for reevaluation.  Could consider increasing gabapentin    Subjective:   I, [name], am serving as a Neurosurgeon for Doctor Morene Mace  Chief Complaint: upper and lower back pain    HPI:    05/11/23 Patient is a 48 year old female that is here for OMT . patient states that she has been having issues with her ribs. Has pain when she laughs , coughs or any twisting movement . She has extreme pain bilaterally . No MOI . Pain started around cancer treatments remission since 2018. Ibu and tylenol  do not help. She is able to reposition her self to help with the pain, and that will take the edge off. Hx of migraines. Pain does radiate to the back and under the rib cage in the front    06/26/2023 Patient states had improvement for a month then had a flare side stitch / that felt like everything shifted. She had a muscle spasm in her hand and forearm    09/01/2023 Patient states still has rib pain   10/29/23 Patient states she continues to have upper back pain radiating to ribs.  She also endorses new lower back pain with symptoms radiating into leg after new workouts over the past several weeks.  No specific MOI   11/10/2023 Patient states low back pain has increased since last month    12/18/23 Patient states that the exercises has helped some, does has pain still with a good laugh or bending or twisting certain ways. Was doing well with yoga.  03/28/2024 Patient states she is having a tingling sensation at injection site. 20% relief from injection   Relevant Historical Information: Rheumatoid arthritis, breast cancer, fibromyalgia   Additional pertinent review of systems negative.   Current Outpatient Medications:    gabapentin  (NEURONTIN ) 100 MG capsule, Take 1 capsule (100 mg total) by mouth at bedtime., Disp: 60 capsule,  Rfl: 0   albuterol  (VENTOLIN  HFA) 108 (90 Base) MCG/ACT inhaler, Inhale into the lungs every 6 (six) hours as needed for wheezing or shortness of breath., Disp: , Rfl:    busPIRone  (BUSPAR ) 5 MG tablet, Take 1 tablet (5 mg total) by mouth 3 (three) times daily., Disp: 90 tablet, Rfl: 1   clobetasol  ointment (TEMOVATE ) 0.05 %, Apply 1 Application topically 2 (two) times daily. Apply as directed twice daily for up to 2 weeks as needed, Disp: 60 g, Rfl: 0   DULoxetine  (CYMBALTA ) 60 MG capsule, Take 1 capsule (60 mg total) by mouth 2 (two) times daily., Disp: 60 capsule, Rfl: 2   EPINEPHrine  0.3 mg/0.3 mL IJ SOAJ injection, Inject 0.3 mg into the muscle as needed for anaphylaxis., Disp: 1 each, Rfl: 1   hydrocortisone  (ANUSOL -HC) 25 MG suppository, Place 1 suppository (25 mg total) rectally 2 (two) times daily as needed for hemorrhoids or anal itching., Disp: 14 suppository, Rfl: 1   hydrOXYzine  (ATARAX ) 25 MG tablet, 1 tablet at bedtime as needed Orally Once a day for 30 day(s), Disp: 30 tablet, Rfl: 3   ibuprofen  (ADVIL ) 800 MG tablet, Take 800 mg by mouth every 8 (eight) hours as needed., Disp: , Rfl:    levothyroxine  (SYNTHROID ) 75 MCG tablet, Take 1 tablet (75 mcg total) by mouth daily before breakfast., Disp: 90 tablet, Rfl: 1   methotrexate  (RHEUMATREX) 2.5 MG tablet, SMARTSIG:5 Tablet(s) By Mouth Once a Week, Disp: , Rfl:    omeprazole  (PRILOSEC) 40 MG capsule, Take 1 capsule (40 mg total) by mouth 2 (two) times daily. Take in the morning and at dinnertime., Disp: 60 capsule, Rfl: 11   prazosin  (MINIPRESS ) 1 MG capsule, Take 1 capsule (1 mg total) by mouth at bedtime., Disp: 30 capsule, Rfl: 1   rizatriptan  (MAXALT ) 10 MG tablet, 1 tablet Orally Once a day for 1 day(s), Disp: , Rfl:    terbinafine  (LAMISIL ) 250 MG tablet, Take 1 tablet (250 mg total) by mouth daily., Disp: 30 tablet, Rfl: 0   traZODone  (DESYREL ) 50 MG tablet, Take 1 tablet (50 mg total) by mouth at bedtime as needed for sleep.,  Disp: 30 tablet, Rfl: 1   triamcinolone  cream (KENALOG ) 0.1 %, Apply topically 2 (two) times daily., Disp: 30 g, Rfl: 0   Vitamin D , Ergocalciferol , (DRISDOL ) 1.25 MG (50000 UNIT) CAPS capsule, Take 1 capsule (50,000 Units total) by mouth every 7 (seven) days., Disp: 12 capsule, Rfl: 1   Objective:     Vitals:   03/28/24 1353  Pulse: 82  SpO2: 100%  Weight: 196 lb (88.9 kg)  Height: 5' 4 (1.626 m)      Body mass index is 33.64 kg/m.    Physical Exam:    Gen: Appears well, nad, nontoxic and pleasant Psych: Alert and oriented, appropriate mood and affect Neuro: sensation intact, strength is 5/5 in upper and lower extremities, muscle tone wnl Skin: no susupicious lesions or rashes  Back - Normal skin, Spine with normal alignment and no deformity.     tenderness to vertebral process palpation.   Bilateral lumbar paraspinous muscles are   tender and without  spasm  TTP gluteal musculature Straight leg raise negative Trendelenberg negative Piriformis Test negative Gait normal    Electronically signed by:  Odis Mace D[facility] 2:24 PM 03/28/24

## 2024-03-28 ENCOUNTER — Ambulatory Visit (INDEPENDENT_AMBULATORY_CARE_PROVIDER_SITE_OTHER): Admitting: Internal Medicine

## 2024-03-28 ENCOUNTER — Ambulatory Visit (INDEPENDENT_AMBULATORY_CARE_PROVIDER_SITE_OTHER): Admitting: Sports Medicine

## 2024-03-28 ENCOUNTER — Encounter: Payer: Self-pay | Admitting: Internal Medicine

## 2024-03-28 VITALS — HR 82 | Ht 64.0 in | Wt 196.0 lb

## 2024-03-28 VITALS — BP 126/84 | HR 84 | Temp 97.1°F | Ht 64.0 in | Wt 196.4 lb

## 2024-03-28 DIAGNOSIS — H9203 Otalgia, bilateral: Secondary | ICD-10-CM

## 2024-03-28 DIAGNOSIS — M546 Pain in thoracic spine: Secondary | ICD-10-CM | POA: Diagnosis not present

## 2024-03-28 DIAGNOSIS — R0781 Pleurodynia: Secondary | ICD-10-CM

## 2024-03-28 DIAGNOSIS — M545 Low back pain, unspecified: Secondary | ICD-10-CM

## 2024-03-28 DIAGNOSIS — E669 Obesity, unspecified: Secondary | ICD-10-CM | POA: Diagnosis not present

## 2024-03-28 DIAGNOSIS — M797 Fibromyalgia: Secondary | ICD-10-CM | POA: Diagnosis not present

## 2024-03-28 DIAGNOSIS — G8929 Other chronic pain: Secondary | ICD-10-CM

## 2024-03-28 DIAGNOSIS — G4733 Obstructive sleep apnea (adult) (pediatric): Secondary | ICD-10-CM | POA: Diagnosis not present

## 2024-03-28 MED ORDER — GABAPENTIN 100 MG PO CAPS: 100.0000 mg | ORAL_CAPSULE | Freq: Every day | ORAL | 0 refills | Status: DC

## 2024-03-28 NOTE — Patient Instructions (Signed)
 Ear ache - can use once daily Claritin or Zyrtec tablet. OR can do Flonase nasal spray

## 2024-03-28 NOTE — Patient Instructions (Signed)
 PT referral   Gabapentin  100 mg nightly if no negative side effects after 1 week can increase to 200 mg   4-6 week follow up
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
## 2024-03-29 ENCOUNTER — Telehealth (INDEPENDENT_AMBULATORY_CARE_PROVIDER_SITE_OTHER): Admitting: Student in an Organized Health Care Education/Training Program

## 2024-03-29 ENCOUNTER — Telehealth (HOSPITAL_COMMUNITY): Payer: Self-pay | Admitting: Professional

## 2024-03-29 ENCOUNTER — Telehealth (HOSPITAL_COMMUNITY): Payer: Self-pay

## 2024-03-29 DIAGNOSIS — F439 Reaction to severe stress, unspecified: Secondary | ICD-10-CM

## 2024-03-29 DIAGNOSIS — F331 Major depressive disorder, recurrent, moderate: Secondary | ICD-10-CM

## 2024-03-29 DIAGNOSIS — F410 Panic disorder [episodic paroxysmal anxiety] without agoraphobia: Secondary | ICD-10-CM

## 2024-03-29 DIAGNOSIS — F515 Nightmare disorder: Secondary | ICD-10-CM

## 2024-03-29 DIAGNOSIS — F411 Generalized anxiety disorder: Secondary | ICD-10-CM

## 2024-03-29 DIAGNOSIS — F502 Bulimia nervosa, unspecified: Secondary | ICD-10-CM

## 2024-03-29 DIAGNOSIS — F4312 Post-traumatic stress disorder, chronic: Secondary | ICD-10-CM

## 2024-03-29 DIAGNOSIS — G479 Sleep disorder, unspecified: Secondary | ICD-10-CM

## 2024-03-29 MED ORDER — PRAZOSIN HCL 1 MG PO CAPS: 1.0000 mg | ORAL_CAPSULE | Freq: Every day | ORAL | 0 refills | Status: DC

## 2024-03-29 MED ORDER — TRAZODONE HCL 50 MG PO TABS: 50.0000 mg | ORAL_TABLET | Freq: Every evening | ORAL | 0 refills | Status: DC | PRN

## 2024-03-29 MED ORDER — BUSPIRONE HCL 5 MG PO TABS: 5.0000 mg | ORAL_TABLET | Freq: Three times a day (TID) | ORAL | 0 refills | Status: DC

## 2024-03-29 NOTE — Telephone Encounter (Signed)
 received fax requesting a 90 day supply of the prazosin . pt was last seen today 9-23 next appt 11-6
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
## 2024-03-29 NOTE — Telephone Encounter (Signed)
 See documentation.

## 2024-03-31 NOTE — Addendum Note (Signed)
 Addended by: MERCY DOMINO A on: 03/31/2024 08:53 AM   Modules accepted: Level of Service

## 2024-04-02 MED ORDER — PRAZOSIN HCL 1 MG PO CAPS: 1.0000 mg | ORAL_CAPSULE | Freq: Every day | ORAL | 2 refills | Status: DC

## 2024-04-02 NOTE — Telephone Encounter (Signed)
 Noted.  I resubmitted prescription of prazosin  for 90 days.  Quaid Yeakle Carrin Carrero, MD PGY-3, [facility] Psychiatry

## 2024-04-05 ENCOUNTER — Telehealth (HOSPITAL_COMMUNITY): Payer: Self-pay | Admitting: Professional

## 2024-04-05 NOTE — Telephone Encounter (Signed)
 left message that rx had been sent to the pharmacy and to check with pharmacy later today.

## 2024-04-05 NOTE — Telephone Encounter (Signed)
 See call log
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
## 2024-04-06 ENCOUNTER — Encounter: Payer: Self-pay | Admitting: Neurology

## 2024-04-06 ENCOUNTER — Ambulatory Visit: Admitting: Neurology

## 2024-04-14 ENCOUNTER — Telehealth (HOSPITAL_COMMUNITY): Payer: Self-pay | Admitting: Professional

## 2024-04-14 NOTE — Telephone Encounter (Signed)
 See call log

## 2024-04-22 ENCOUNTER — Ambulatory Visit: Payer: Medicare Other
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
## 2024-04-25 ENCOUNTER — Encounter: Payer: Self-pay | Admitting: Physical Therapy

## 2024-04-25 ENCOUNTER — Other Ambulatory Visit: Payer: Self-pay

## 2024-04-25 ENCOUNTER — Ambulatory Visit: Attending: Sports Medicine | Admitting: Physical Therapy

## 2024-04-25 ENCOUNTER — Ambulatory Visit
Admission: RE | Admit: 2024-04-25 | Discharge: 2024-04-25 | Disposition: A | Discharge status: Home / Self Care | Source: Ambulatory Visit | Attending: Obstetrics and Gynecology | Admitting: Obstetrics and Gynecology

## 2024-04-25 DIAGNOSIS — G8929 Other chronic pain: Secondary | ICD-10-CM | POA: Insufficient documentation

## 2024-04-25 DIAGNOSIS — M5459 Other low back pain: Secondary | ICD-10-CM | POA: Insufficient documentation

## 2024-04-25 DIAGNOSIS — M546 Pain in thoracic spine: Secondary | ICD-10-CM | POA: Diagnosis present

## 2024-04-25 DIAGNOSIS — R0789 Other chest pain: Secondary | ICD-10-CM | POA: Insufficient documentation

## 2024-04-25 DIAGNOSIS — Z1231 Encounter for screening mammogram for malignant neoplasm of breast: Secondary | ICD-10-CM

## 2024-04-25 DIAGNOSIS — R252 Cramp and spasm: Secondary | ICD-10-CM | POA: Diagnosis present

## 2024-04-25 DIAGNOSIS — M545 Low back pain, unspecified: Secondary | ICD-10-CM | POA: Insufficient documentation

## 2024-04-25 NOTE — Progress Notes (Unsigned)
 [name] [address] [place] [address] Phone: [phone]   Assessment and Plan:     1. Chronic bilateral low back pain without sciatica (Primary) 2. Chronic bilateral thoracic back pain 3. Rib pain 4. Fibromyalgia -Chronic with exacerbation, subsequent visit - Patient still experiencing musculoskeletal back pain.  Additional new onset of bilateral knee pain without MOI.  Unclear etiology of ongoing pain, but musculoskeletal dysfunction, fibromyalgia are most likely differentials - Patient noticed minimal benefit after epidural CSI to thoracic spine in August 2025, and minimal relief with OMT so do not recommend repeat treatments at this time - Patient has had 1 physical therapy appointment with mild benefit.  Recommend continuing physical therapy which I feel will be beneficial for patient - Continue HEP - Patient was previously on prolonged NSAID course.  Due to side effects, do not recommend prolonged NSAIDs - Use Tylenol  500 to 1000 mg tablets 2-3 times a day for day-to-day pain relief - Patient has history of binge eating episodes, so do not recommend prednisone  as this could trigger likely additional episodes - Continue Cymbalta  for chronic pain, fibromyalgia.  Continue follow-up with behavioral health - In the past, rheumatoid arthritis has been mentioned in chart. workup in 02/11/2017 with negative ANA, negative RF, negative CCP. her vitamin D  was low at 17, did have elevated inflammatory marker with sedimentation rate 56, CRP 6.9. CK was mildly elevated at 258, no proximal weakness. AVISE test in 01/2018: + ANA but ENA and ds DNA negative.  ANA in 2020 was negative.  I do not see diagnostic criteria for rheumatoid arthritis. - Patient started gabapentin  100 mg with no significant change in symptoms, but tolerated medication well.  Recommend increasing to gabapentin  100 mg twice daily.  If no negative side effects, but no  improvement after 1 week, further increase to gabapentin  200 mg twice daily.  Refill provided     Pertinent previous records reviewed include none   Follow Up: 4 to 6 weeks for reevaluation.  Could consider further increasing gabapentin    Subjective:   I, [name], am serving as a Neurosurgeon for Doctor [name]   Chief Complaint: upper and lower back pain    HPI:    05/11/23 Patient is a 48 year old female that is here for OMT . patient states that she has been having issues with her ribs. Has pain when she laughs , coughs or any twisting movement . She has extreme pain bilaterally . No MOI . Pain started around cancer treatments remission since 2018. Ibu and tylenol  do not help. She is able to reposition her self to help with the pain, and that will take the edge off. Hx of migraines. Pain does radiate to the back and under the rib cage in the front    06/26/2023 Patient states had improvement for a month then had a flare side stitch / that felt like everything shifted. She had a muscle spasm in her hand and forearm    09/01/2023 Patient states still has rib pain   10/29/23 Patient states she continues to have upper back pain radiating to ribs.  She also endorses new lower back pain with symptoms radiating into leg after new workouts over the past several weeks.  No specific MOI   11/10/2023 Patient states low back pain has increased since last month    12/18/23 Patient states that the exercises has helped some, does has pain still with a good  laugh or bending or twisting certain ways. Was doing well with yoga.    03/28/2024 Patient states she is having a tingling sensation at injection site. 20% relief from injection   04/26/2024 Patient states one PT helped a little. Still has some tightness   Relevant Historical Information: Rheumatoid arthritis, breast cancer, fibromyalgia   Additional pertinent review of systems negative.   Current Outpatient Medications:     albuterol  (VENTOLIN  HFA) 108 (90 Base) MCG/ACT inhaler, Inhale into the lungs every 6 (six) hours as needed for wheezing or shortness of breath., Disp: , Rfl:    busPIRone  (BUSPAR ) 5 MG tablet, Take 1 tablet (5 mg total) by mouth 3 (three) times daily., Disp: 90 tablet, Rfl: 0   clobetasol  ointment (TEMOVATE ) 0.05 %, Apply 1 Application topically 2 (two) times daily. Apply as directed twice daily for up to 2 weeks as needed, Disp: 60 g, Rfl: 0   DULoxetine  (CYMBALTA ) 60 MG capsule, Take 1 capsule (60 mg total) by mouth 2 (two) times daily., Disp: 60 capsule, Rfl: 2   EPINEPHrine  0.3 mg/0.3 mL IJ SOAJ injection, Inject 0.3 mg into the muscle as needed for anaphylaxis., Disp: 1 each, Rfl: 1   gabapentin  (NEURONTIN ) 100 MG capsule, Take 2 capsules (200 mg total) by mouth 2 (two) times daily., Disp: 120 capsule, Rfl: 0   hydrocortisone  (ANUSOL -HC) 25 MG suppository, Place 1 suppository (25 mg total) rectally 2 (two) times daily as needed for hemorrhoids or anal itching., Disp: 14 suppository, Rfl: 1   hydrOXYzine  (ATARAX ) 25 MG tablet, 1 tablet at bedtime as needed Orally Once a day for 30 day(s), Disp: 30 tablet, Rfl: 3   ibuprofen  (ADVIL ) 800 MG tablet, Take 800 mg by mouth every 8 (eight) hours as needed., Disp: , Rfl:    levothyroxine  (SYNTHROID ) 75 MCG tablet, Take 1 tablet (75 mcg total) by mouth daily before breakfast., Disp: 90 tablet, Rfl: 1   methotrexate  (RHEUMATREX) 2.5 MG tablet, SMARTSIG:5 Tablet(s) By Mouth Once a Week, Disp: , Rfl:    omeprazole  (PRILOSEC) 40 MG capsule, Take 1 capsule (40 mg total) by mouth 2 (two) times daily. Take in the morning and at dinnertime., Disp: 60 capsule, Rfl: 11   prazosin  (MINIPRESS ) 1 MG capsule, Take 1 capsule (1 mg total) by mouth at bedtime., Disp: 30 capsule, Rfl: 2   rizatriptan  (MAXALT ) 10 MG tablet, 1 tablet Orally Once a day for 1 day(s), Disp: , Rfl:    terbinafine  (LAMISIL ) 250 MG tablet, Take 1 tablet (250 mg total) by mouth daily., Disp: 30  tablet, Rfl: 0   traZODone  (DESYREL ) 50 MG tablet, Take 1 tablet (50 mg total) by mouth at bedtime as needed for sleep., Disp: 30 tablet, Rfl: 0   triamcinolone  cream (KENALOG ) 0.1 %, Apply topically 2 (two) times daily., Disp: 30 g, Rfl: 0   Vitamin D , Ergocalciferol , (DRISDOL ) 1.25 MG (50000 UNIT) CAPS capsule, Take 1 capsule (50,000 Units total) by mouth every 7 (seven) days., Disp: 12 capsule, Rfl: 1   Objective:     Vitals:   04/26/24 1425  Pulse: 88  SpO2: 99%  Weight: 196 lb (88.9 kg)  Height: 5' 4 (1.626 m)      Body mass index is 33.64 kg/m.    Physical Exam:     Gen: Appears well, nad, nontoxic and pleasant Psych: Alert and oriented, appropriate mood and affect Neuro: sensation intact, strength is 5/5 in upper and lower extremities, muscle tone wnl Skin: no susupicious lesions or rashes  Back - Normal skin, Spine with normal alignment and no deformity.     tenderness to vertebral process palpation.   Bilateral lumbar paraspinous muscles are   tender and without spasm  TTP gluteal musculature Straight leg raise negative Trendelenberg negative Piriformis Test negative Gait normal       Electronically signed by:  Odis [name] D.CLEMENTEEN [name] 2:53 PM 04/26/24

## 2024-04-26 ENCOUNTER — Ambulatory Visit: Admitting: Sports Medicine

## 2024-04-26 ENCOUNTER — Ambulatory Visit: Admitting: Neurology

## 2024-04-26 VITALS — HR 88 | Ht 64.0 in | Wt 196.0 lb

## 2024-04-26 DIAGNOSIS — M546 Pain in thoracic spine: Secondary | ICD-10-CM | POA: Diagnosis not present

## 2024-04-26 DIAGNOSIS — R0789 Other chest pain: Secondary | ICD-10-CM

## 2024-04-26 DIAGNOSIS — M545 Low back pain, unspecified: Secondary | ICD-10-CM

## 2024-04-26 DIAGNOSIS — M797 Fibromyalgia: Secondary | ICD-10-CM | POA: Diagnosis not present

## 2024-04-26 DIAGNOSIS — G8929 Other chronic pain: Secondary | ICD-10-CM

## 2024-04-26 MED ORDER — GABAPENTIN 100 MG PO CAPS: 200.0000 mg | ORAL_CAPSULE | Freq: Two times a day (BID) | ORAL | 0 refills | Status: AC

## 2024-04-26 NOTE — Patient Instructions (Signed)
 Increase gabapentin  100 mg 2x daily   If well tolerated but no significant improvement after 1 week further increase to gabapentin  200 mg 2x daily

## 2024-04-27 ENCOUNTER — Encounter: Payer: Self-pay | Admitting: Neurology

## 2024-04-27 ENCOUNTER — Ambulatory Visit: Admitting: Neurology

## 2024-04-27 VITALS — BP 123/80 | HR 79 | Ht 64.0 in | Wt 199.2 lb

## 2024-04-27 DIAGNOSIS — G4719 Other hypersomnia: Secondary | ICD-10-CM

## 2024-04-27 DIAGNOSIS — Z8669 Personal history of other diseases of the nervous system and sense organs: Secondary | ICD-10-CM | POA: Diagnosis not present

## 2024-04-27 DIAGNOSIS — G4700 Insomnia, unspecified: Secondary | ICD-10-CM

## 2024-04-27 DIAGNOSIS — R519 Headache, unspecified: Secondary | ICD-10-CM

## 2024-04-27 DIAGNOSIS — R351 Nocturia: Secondary | ICD-10-CM

## 2024-04-27 DIAGNOSIS — R634 Abnormal weight loss: Secondary | ICD-10-CM

## 2024-04-27 DIAGNOSIS — Z789 Other specified health status: Secondary | ICD-10-CM | POA: Diagnosis not present
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
## 2024-04-27 NOTE — Patient Instructions (Signed)

## 2024-04-28 ENCOUNTER — Ambulatory Visit: Admitting: Internal Medicine

## 2024-04-28 ENCOUNTER — Ambulatory Visit: Payer: Self-pay | Admitting: Obstetrics and Gynecology

## 2024-04-28 VITALS — BP 122/80 | HR 76 | Temp 97.8°F | Ht 64.0 in | Wt 155.8 lb

## 2024-04-28 DIAGNOSIS — K219 Gastro-esophageal reflux disease without esophagitis: Secondary | ICD-10-CM | POA: Diagnosis not present

## 2024-04-28 DIAGNOSIS — E034 Atrophy of thyroid (acquired): Secondary | ICD-10-CM

## 2024-04-28 DIAGNOSIS — G4733 Obstructive sleep apnea (adult) (pediatric): Secondary | ICD-10-CM

## 2024-04-28 DIAGNOSIS — Z91030 Bee allergy status: Secondary | ICD-10-CM

## 2024-04-28 DIAGNOSIS — E559 Vitamin D deficiency, unspecified: Secondary | ICD-10-CM

## 2024-04-28 DIAGNOSIS — M05732 Rheumatoid arthritis with rheumatoid factor of left wrist without organ or systems involvement: Secondary | ICD-10-CM

## 2024-04-28 DIAGNOSIS — M05731 Rheumatoid arthritis with rheumatoid factor of right wrist without organ or systems involvement: Secondary | ICD-10-CM | POA: Diagnosis not present

## 2024-04-28 DIAGNOSIS — Z13220 Encounter for screening for lipoid disorders: Secondary | ICD-10-CM | POA: Diagnosis not present

## 2024-04-28 LAB — COMPREHENSIVE METABOLIC PANEL WITH GFR
ALT: 14 U/L (ref 0–35)
AST: 16 U/L (ref 0–37)
Albumin: 4.3 g/dL (ref 3.5–5.2)
Alkaline Phosphatase: 69 U/L (ref 39–117)
BUN: 10 mg/dL (ref 6–23)
CO2: 27 meq/L (ref 19–32)
Calcium: 9.3 mg/dL (ref 8.4–10.5)
Chloride: 103 meq/L (ref 96–112)
Creatinine, Ser: 0.81 mg/dL (ref 0.40–1.20)
GFR: 84.63 mL/min (ref >60.00)
Glucose, Bld: 93 mg/dL (ref 70–99)
Potassium: 3.7 meq/L (ref 3.5–5.1)
Sodium: 138 meq/L (ref 135–145)
Total Bilirubin: 0.6 mg/dL (ref 0.2–1.2)
Total Protein: 7.6 g/dL (ref 6.0–8.3)

## 2024-04-28 LAB — CBC WITH DIFFERENTIAL/PLATELET
Basophils Absolute: 0 K/uL (ref 0.0–0.1)
Basophils Relative: 0.4 % (ref 0.0–3.0)
Eosinophils Absolute: 0.2 K/uL (ref 0.0–0.7)
Eosinophils Relative: 3.1 % (ref 0.0–5.0)
HCT: 40.1 % (ref 36.0–46.0)
Hemoglobin: 13.1 g/dL (ref 12.0–15.0)
Lymphocytes Relative: 29 % (ref 12.0–46.0)
Lymphs Abs: 2 K/uL (ref 0.7–4.0)
MCHC: 32.6 g/dL (ref 30.0–36.0)
MCV: 87.7 fl (ref 78.0–100.0)
Monocytes Absolute: 0.5 K/uL (ref 0.1–1.0)
Monocytes Relative: 7.6 % (ref 3.0–12.0)
Neutro Abs: 4.2 K/uL (ref 1.4–7.7)
Neutrophils Relative %: 59.9 % (ref 43.0–77.0)
Platelets: 324 K/uL (ref 150.0–400.0)
RBC: 4.57 Mil/uL (ref 3.87–5.11)
RDW: 14.6 % (ref 11.5–15.5)
WBC: 6.9 K/uL (ref 4.0–10.5)

## 2024-04-28 LAB — LIPID PANEL
Cholesterol: 143 mg/dL (ref 0–200)
HDL: 49.4 mg/dL (ref >39.00)
LDL Cholesterol: 72 mg/dL (ref 0–99)
NonHDL: 93.71
Total CHOL/HDL Ratio: 3
Triglycerides: 109 mg/dL (ref 0.0–149.0)
VLDL: 21.8 mg/dL (ref 0.0–40.0)

## 2024-04-28 LAB — TSH: TSH: 1.42 u[IU]/mL (ref 0.35–5.50)

## 2024-04-28 LAB — VITAMIN D 25 HYDROXY (VIT D DEFICIENCY, FRACTURES): VITD: 34.23 ng/mL (ref 30.00–100.00)

## 2024-04-28 MED ORDER — OMEPRAZOLE 40 MG PO CPDR: 40.0000 mg | DELAYED_RELEASE_CAPSULE | Freq: Two times a day (BID) | ORAL | 11 refills | Status: AC

## 2024-04-28 MED ORDER — EPINEPHRINE 0.3 MG/0.3ML IJ SOAJ: 0.3000 mg | INTRAMUSCULAR | 1 refills | Status: AC | PRN
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
## 2024-05-02 ENCOUNTER — Encounter (HOSPITAL_COMMUNITY): Payer: Self-pay

## 2024-05-03 ENCOUNTER — Ambulatory Visit: Payer: Self-pay | Admitting: Internal Medicine

## 2024-05-06 ENCOUNTER — Other Ambulatory Visit (HOSPITAL_COMMUNITY): Payer: Self-pay | Admitting: Student in an Organized Health Care Education/Training Program

## 2024-05-06 DIAGNOSIS — F410 Panic disorder [episodic paroxysmal anxiety] without agoraphobia: Secondary | ICD-10-CM

## 2024-05-06 DIAGNOSIS — F331 Major depressive disorder, recurrent, moderate: Secondary | ICD-10-CM

## 2024-05-06 DIAGNOSIS — F515 Nightmare disorder: Secondary | ICD-10-CM

## 2024-05-07 ENCOUNTER — Other Ambulatory Visit (HOSPITAL_COMMUNITY): Payer: Self-pay | Admitting: Student in an Organized Health Care Education/Training Program

## 2024-05-07 DIAGNOSIS — F515 Nightmare disorder: Secondary | ICD-10-CM

## 2024-05-07 MED ORDER — PRAZOSIN HCL 1 MG PO CAPS: 1.0000 mg | ORAL_CAPSULE | Freq: Every day | ORAL | 0 refills | Status: DC

## 2024-05-07 MED ORDER — DULOXETINE HCL 60 MG PO CPEP: 60.0000 mg | ORAL_CAPSULE | Freq: Two times a day (BID) | ORAL | 0 refills | Status: DC

## 2024-05-07 MED ORDER — TRAZODONE HCL 50 MG PO TABS
50.0000 mg | ORAL_TABLET | Freq: Every evening | ORAL | 0 refills | Status: DC | PRN
Start: 2024-05-07 — End: 2024-05-31

## 2024-05-07 MED ORDER — HYDROXYZINE HCL 25 MG PO TABS: ORAL_TABLET | ORAL | 0 refills | Status: DC

## 2024-05-09 ENCOUNTER — Ambulatory Visit: Admitting: Rehabilitative and Restorative Service Providers"

## 2024-05-11 ENCOUNTER — Ambulatory Visit: Admitting: Physical Therapy

## 2024-05-12 ENCOUNTER — Encounter (HOSPITAL_COMMUNITY): Admitting: Student in an Organized Health Care Education/Training Program

## 2024-05-16 ENCOUNTER — Ambulatory Visit: Admitting: Physical Therapy

## 2024-05-17 ENCOUNTER — Encounter: Admitting: Internal Medicine

## 2024-05-18 ENCOUNTER — Ambulatory Visit: Admitting: Physical Therapy

## 2024-05-22 ENCOUNTER — Telehealth: Admitting: Physician Assistant

## 2024-05-22 DIAGNOSIS — B9689 Other specified bacterial agents as the cause of diseases classified elsewhere: Secondary | ICD-10-CM | POA: Diagnosis not present

## 2024-05-22 DIAGNOSIS — J019 Acute sinusitis, unspecified: Secondary | ICD-10-CM

## 2024-05-22 MED ORDER — DOXYCYCLINE HYCLATE 100 MG PO TABS: 100.0000 mg | ORAL_TABLET | Freq: Two times a day (BID) | ORAL | 0 refills | Status: DC

## 2024-05-22 NOTE — Patient Instructions (Signed)
 [name], thank you for joining Delon CHRISTELLA Dickinson, PA-C for today's virtual visit.  While this provider is not your primary care provider (PCP), if your PCP is located in our provider database this encounter information will be shared with them immediately following your visit.   A [facility] MyChart account gives you access to today's visit and all your visits, tests, and labs performed at [facility]  click here if you don't have a [facility] MyChart account or go to [email or web address]  Consent: (Patient) [name] provided verbal consent for this virtual visit at the beginning of the encounter.  Current Medications:  Current Outpatient Medications:    doxycycline  (VIBRA -TABS) 100 MG tablet, Take 1 tablet (100 mg total) by mouth 2 (two) times daily., Disp: 14 tablet, Rfl: 0   albuterol  (VENTOLIN  HFA) 108 (90 Base) MCG/ACT inhaler, Inhale into the lungs every 6 (six) hours as needed for wheezing or shortness of breath., Disp: , Rfl:    busPIRone  (BUSPAR ) 5 MG tablet, TAKE 1 TABLET(5 MG) BY MOUTH THREE TIMES DAILY, Disp: 90 tablet, Rfl: 0   clobetasol  ointment (TEMOVATE ) 0.05 %, Apply 1 Application topically 2 (two) times daily. Apply as directed twice daily for up to 2 weeks as needed (Patient taking differently: Apply 1 Application topically as needed. Apply as directed twice daily for up to 2 weeks as needed), Disp: 60 g, Rfl: 0   DULoxetine  (CYMBALTA ) 60 MG capsule, Take 1 capsule (60 mg total) by mouth 2 (two) times daily., Disp: 30 capsule, Rfl: 0   EPINEPHrine  0.3 mg/0.3 mL IJ SOAJ injection, Inject 0.3 mg into the muscle as needed for anaphylaxis., Disp: 2 each, Rfl: 1   gabapentin  (NEURONTIN ) 100 MG capsule, Take 2 capsules (200 mg total) by mouth 2 (two) times daily., Disp: 120 capsule, Rfl: 0   hydrocortisone  (ANUSOL -HC) 25 MG suppository, Place 1 suppository (25 mg total) rectally 2 (two) times daily as needed for hemorrhoids or anal  itching., Disp: 14 suppository, Rfl: 1   hydrOXYzine  (ATARAX ) 25 MG tablet, 1 tablet at bedtime as needed Orally Once a day for 30 day(s), Disp: 30 tablet, Rfl: 0   ibuprofen  (ADVIL ) 800 MG tablet, Take 800 mg by mouth every 8 (eight) hours as needed., Disp: , Rfl:    levothyroxine  (SYNTHROID ) 75 MCG tablet, Take 1 tablet (75 mcg total) by mouth daily before breakfast., Disp: 90 tablet, Rfl: 1   methotrexate  (RHEUMATREX) 2.5 MG tablet, SMARTSIG:5 Tablet(s) By Mouth Once a Week, Disp: , Rfl:    omeprazole  (PRILOSEC) 40 MG capsule, Take 1 capsule (40 mg total) by mouth 2 (two) times daily. Take in the morning and at dinnertime., Disp: 60 capsule, Rfl: 11   prazosin  (MINIPRESS ) 1 MG capsule, TAKE 1 CAPSULE(1 MG) BY MOUTH AT BEDTIME, Disp: 90 capsule, Rfl: 0   rizatriptan  (MAXALT ) 10 MG tablet, 1 tablet Orally Once a day for 1 day(s), Disp: , Rfl:    terbinafine  (LAMISIL ) 250 MG tablet, Take 1 tablet (250 mg total) by mouth daily., Disp: 30 tablet, Rfl: 0   traZODone  (DESYREL ) 50 MG tablet, Take 1 tablet (50 mg total) by mouth at bedtime as needed for sleep., Disp: 30 tablet, Rfl: 0   triamcinolone  cream (KENALOG ) 0.1 %, Apply topically 2 (two) times daily., Disp: 30 g, Rfl: 0   Vitamin D , Ergocalciferol , (DRISDOL ) 1.25 MG (50000 UNIT) CAPS capsule, Take 1 capsule (50,000 Units total) by mouth every 7 (seven) days., Disp: 12 capsule, Rfl: 1  Medications ordered in this encounter:  Meds ordered this encounter  Medications   doxycycline  (VIBRA -TABS) 100 MG tablet    Sig: Take 1 tablet (100 mg total) by mouth 2 (two) times daily.    Dispense:  14 tablet    Refill:  0    Supervising Provider:   BLAISE ALEENE KIDD [[id number]]     *If you need refills on other medications prior to your next appointment, please contact your pharmacy*  Follow-Up: Call back or seek an in-person evaluation if the symptoms worsen or if the condition fails to improve as anticipated.  [facility] [phone]  Other Instructions  Sinus Infection, Adult A sinus infection, also called sinusitis, is inflammation of your sinuses. Sinuses are hollow spaces in the bones around your face. Your sinuses are located: Around your eyes. In the middle of your forehead. Behind your nose. In your cheekbones. Mucus normally drains out of your sinuses. When your nasal tissues become inflamed or swollen, mucus can become trapped or blocked. This allows bacteria, viruses, and fungi to grow, which leads to infection. Most infections of the sinuses are caused by a virus. A sinus infection can develop quickly. It can last for up to 4 weeks (acute) or for more than 12 weeks (chronic). A sinus infection often develops after a cold. What are the causes? This condition is caused by anything that creates swelling in the sinuses or stops mucus from draining. This includes: Allergies. Asthma. Infection from bacteria or viruses. Deformities or blockages in your nose or sinuses. Abnormal growths in the nose (nasal polyps). Pollutants, such as chemicals or irritants in the air. Infection from fungi. This is rare. What increases the risk? You are more likely to develop this condition if you: Have a weak body defense system (immune system). Do a lot of swimming or diving. Overuse nasal sprays. Smoke. What are the signs or symptoms? The main symptoms of this condition are pain and a feeling of pressure around the affected sinuses. Other symptoms include: Stuffy nose or congestion that makes it difficult to breathe through your nose. Thick yellow or greenish drainage from your nose. Tenderness, swelling, and warmth over the affected sinuses. A cough that may get worse at night. Decreased sense of smell and taste. Extra mucus that collects in the throat or the back of the nose (postnasal drip) causing a sore throat or bad breath. Tiredness (fatigue). Fever. How is this diagnosed? This condition is diagnosed  based on: Your symptoms. Your medical history. A physical exam. Tests to find out if your condition is acute or chronic. This may include: Checking your nose for nasal polyps. Viewing your sinuses using a device that has a light (endoscope). Testing for allergies or bacteria. Imaging tests, such as an MRI or CT scan. In rare cases, a bone biopsy may be done to rule out more serious types of fungal sinus disease. How is this treated? Treatment for a sinus infection depends on the cause and whether your condition is chronic or acute. If caused by a virus, your symptoms should go away on their own within 10 days. You may be given medicines to relieve symptoms. They include: Medicines that shrink swollen nasal passages (decongestants). A spray that eases inflammation of the nostrils (topical intranasal corticosteroids). Rinses that help get rid of thick mucus in your nose (nasal saline washes). Medicines that treat allergies (antihistamines). Over-the-counter pain relievers. If caused by bacteria, your health care provider may recommend waiting to see if  your symptoms improve. Most bacterial infections will get better without antibiotic medicine. You may be given antibiotics if you have: A severe infection. A weak immune system. If caused by narrow nasal passages or nasal polyps, surgery may be needed. Follow these instructions at home: Medicines Take, use, or apply over-the-counter and prescription medicines only as told by your health care provider. These may include nasal sprays. If you were prescribed an antibiotic medicine, take it as told by your health care provider. Do not stop taking the antibiotic even if you start to feel better. Hydrate and humidify  Drink enough fluid to keep your urine pale yellow. Staying hydrated will help to thin your mucus. Use a cool mist humidifier to keep the humidity level in your home above 50%. Inhale steam for 10-15 minutes, 3-4 times a day, or as  told by your health care provider. You can do this in the bathroom while a hot shower is running. Limit your exposure to cool or dry air. Rest Rest as much as possible. Sleep with your head raised (elevated). Make sure you get enough sleep each night. General instructions  Apply a warm, moist washcloth to your face 3-4 times a day or as told by your health care provider. This will help with discomfort. Use nasal saline washes as often as told by your health care provider. Wash your hands often with soap and water to reduce your exposure to germs. If soap and water are not available, use hand sanitizer. Do not smoke. Avoid being around people who are smoking (secondhand smoke). Keep all follow-up visits. This is important. Contact a health care provider if: You have a fever. Your symptoms get worse. Your symptoms do not improve within 10 days. Get help right away if: You have a severe headache. You have persistent vomiting. You have severe pain or swelling around your face or eyes. You have vision problems. You develop confusion. Your neck is stiff. You have trouble breathing. These symptoms may be an emergency. Get help right away. Call 911. Do not wait to see if the symptoms will go away. Do not drive yourself to the hospital. Summary A sinus infection is soreness and inflammation of your sinuses. Sinuses are hollow spaces in the bones around your face. This condition is caused by nasal tissues that become inflamed or swollen. The swelling traps or blocks the flow of mucus. This allows bacteria, viruses, and fungi to grow, which leads to infection. If you were prescribed an antibiotic medicine, take it as told by your health care provider. Do not stop taking the antibiotic even if you start to feel better. Keep all follow-up visits. This is important. This information is not intended to replace advice given to you by your health care provider. Make sure you discuss any questions you  have with your health care provider. Document Revised: 05/28/2021 Document Reviewed: 05/28/2021 Elsevier Patient Education  2024 Elsevier Inc.   If you have been instructed to have an in-person evaluation today at a local Urgent Care facility, please use the link below. It will take you to a list of all of our available [facility], including address, phone number and hours of operation. Please do not delay care.  [facility]  If you or a family member do not have a primary care provider, use the link below to schedule a visit and establish care. When you choose a  primary care physician or advanced practice provider, you gain a long-term  partner in health. Find a Primary Care Provider  Learn more about [facility]'s in-office and virtual care options:  - Get Care Now

## 2024-05-22 NOTE — Progress Notes (Signed)
 Virtual Visit Consent   [name], you are scheduled for a virtual visit with a [facility] provider today. Just as with appointments in the office, your consent must be obtained to participate. Your consent will be active for this visit and any virtual visit you may have with one of our providers in the next 365 days. If you have a MyChart account, a copy of this consent can be sent to you electronically.  As this is a virtual visit, video technology does not allow for your provider to perform a traditional examination. This may limit your provider's ability to fully assess your condition. If your provider identifies any concerns that need to be evaluated in person or the need to arrange testing (such as labs, EKG, etc.), we will make arrangements to do so. Although advances in technology are sophisticated, we cannot ensure that it will always work on either your end or our end. If the connection with a video visit is poor, the visit may have to be switched to a telephone visit. With either a video or telephone visit, we are not always able to ensure that we have a secure connection.  By engaging in this virtual visit, you consent to the provision of healthcare and authorize for your insurance to be billed (if applicable) for the services provided during this visit. Depending on your insurance coverage, you may receive a charge related to this service.  I need to obtain your verbal consent now. Are you willing to proceed with your visit today? [name] has provided verbal consent on 05/22/2024 for a virtual visit (video or telephone). Jordan CHRISTELLA Dickinson, PA-C  Date: 05/22/2024 11:16 AM   Virtual Visit via Video Note   I, [name], connected with  [name]  ([id number], Dec 25, 1975) on 05/22/24 at 11:15 AM EST by a video-enabled telemedicine application and verified that I am speaking with the correct person using two identifiers.  Location: Patient:  Virtual Visit Location Patient: Home Provider: Virtual Visit Location Provider: Home Office   I discussed the limitations of evaluation and management by telemedicine and the availability of in person appointments. The patient expressed understanding and agreed to proceed.    History of Present Illness: [name] is a 48 y.o. who identifies as a female who was assigned female at birth, and is being seen today for sinus congestion.  HPI: Sinusitis This is a new problem. The current episode started 1 to 4 weeks ago. The problem has been gradually worsening since onset. Associated symptoms include chills, congestion, coughing (dry), ear pain (left), headaches, sinus pressure and a sore throat. Pertinent negatives include no diaphoresis or shortness of breath. (Body aches, rhinorrhea, post nasal drainage) Treatments tried: mucinex nighttime, azelastine nasal spray, Vit C, herbal tea. The treatment provided no relief.     Problems:  Patient Active Problem List   Diagnosis Date Noted   Bee sting allergy 04/28/2024   Hypothyroidism due to acquired atrophy of thyroid 02/02/2024   Chronic migraine without aura, with intractable migraine, so stated, with status migrainosus 08/01/2022   Myofascial pain dysfunction syndrome 08/01/2022   Major depressive disorder, recurrent episode, severe (HCC) 10/22/2021   PTSD (post-traumatic stress disorder) 10/22/2021   Chronic pain syndrome 03/04/2021   Multiple atypical nevi 11/09/2020   Chronic left-sided thoracic back pain 11/21/2019   Fibromyalgia    Generalized anxiety disorder with panic attacks    Binge eating disorder 05/08/2018   Chronic migraine w/o aura w/o status  migrainosus, not intractable 05/08/2018   Rheumatoid arthritis (HCC) 01/27/2018   OSA (obstructive sleep apnea), could not tolerate CPAP 08/15/2017   Hip flexor tightness, left 08/04/2017   Vitamin D  deficiency 02/19/2017   Fatigue 01/28/2017   Obesity (BMI 30-39.9)  01/28/2017   Gastroesophageal reflux disease without esophagitis 01/28/2017   Breast cancer of upper-outer quadrant of left female breast (HCC) 12/11/2015   Endometriosis 05/12/2014   History of hysterectomy for benign disease 05/12/2014    Allergies:  Allergies  Allergen Reactions   Amoxicillin Shortness Of Breath and Itching    Has patient had a PCN reaction causing immediate rash, facial/tongue/throat swelling, SOB or lightheadedness with hypotension: Yes Has patient had a PCN reaction causing severe rash involving mucus membranes or skin necrosis: No Has patient had a PCN reaction that required hospitalization Yes Has patient had a PCN reaction occurring within the last 10 years: No If [name] of the above answers are NO, then may proceed with Cephalosporin use.    Bee Venom Anaphylaxis   Diflucan  [Fluconazole ] Nausea Only    heartburn   Hydroxychloroquine Sulfate Other (See Comments)   Plaquenil [Hydroxychloroquine] Rash   Medications:  Current Outpatient Medications:    doxycycline  (VIBRA -TABS) 100 MG tablet, Take 1 tablet (100 mg total) by mouth 2 (two) times daily., Disp: 14 tablet, Rfl: 0   albuterol  (VENTOLIN  HFA) 108 (90 Base) MCG/ACT inhaler, Inhale into the lungs every 6 (six) hours as needed for wheezing or shortness of breath., Disp: , Rfl:    busPIRone  (BUSPAR ) 5 MG tablet, TAKE 1 TABLET(5 MG) BY MOUTH THREE TIMES DAILY, Disp: 90 tablet, Rfl: 0   clobetasol  ointment (TEMOVATE ) 0.05 %, Apply 1 Application topically 2 (two) times daily. Apply as directed twice daily for up to 2 weeks as needed (Patient taking differently: Apply 1 Application topically as needed. Apply as directed twice daily for up to 2 weeks as needed), Disp: 60 g, Rfl: 0   DULoxetine  (CYMBALTA ) 60 MG capsule, Take 1 capsule (60 mg total) by mouth 2 (two) times daily., Disp: 30 capsule, Rfl: 0   EPINEPHrine  0.3 mg/0.3 mL IJ SOAJ injection, Inject 0.3 mg into the muscle as needed for anaphylaxis., Disp: 2  each, Rfl: 1   gabapentin  (NEURONTIN ) 100 MG capsule, Take 2 capsules (200 mg total) by mouth 2 (two) times daily., Disp: 120 capsule, Rfl: 0   hydrocortisone  (ANUSOL -HC) 25 MG suppository, Place 1 suppository (25 mg total) rectally 2 (two) times daily as needed for hemorrhoids or anal itching., Disp: 14 suppository, Rfl: 1   hydrOXYzine  (ATARAX ) 25 MG tablet, 1 tablet at bedtime as needed Orally Once a day for 30 day(s), Disp: 30 tablet, Rfl: 0   ibuprofen  (ADVIL ) 800 MG tablet, Take 800 mg by mouth every 8 (eight) hours as needed., Disp: , Rfl:    levothyroxine  (SYNTHROID ) 75 MCG tablet, Take 1 tablet (75 mcg total) by mouth daily before breakfast., Disp: 90 tablet, Rfl: 1   methotrexate  (RHEUMATREX) 2.5 MG tablet, SMARTSIG:5 Tablet(s) By Mouth Once a Week, Disp: , Rfl:    omeprazole  (PRILOSEC) 40 MG capsule, Take 1 capsule (40 mg total) by mouth 2 (two) times daily. Take in the morning and at dinnertime., Disp: 60 capsule, Rfl: 11   prazosin  (MINIPRESS ) 1 MG capsule, TAKE 1 CAPSULE(1 MG) BY MOUTH AT BEDTIME, Disp: 90 capsule, Rfl: 0   rizatriptan  (MAXALT ) 10 MG tablet, 1 tablet Orally Once a day for 1 day(s), Disp: , Rfl:    terbinafine  (LAMISIL ) 250  MG tablet, Take 1 tablet (250 mg total) by mouth daily., Disp: 30 tablet, Rfl: 0   traZODone  (DESYREL ) 50 MG tablet, Take 1 tablet (50 mg total) by mouth at bedtime as needed for sleep., Disp: 30 tablet, Rfl: 0   triamcinolone  cream (KENALOG ) 0.1 %, Apply topically 2 (two) times daily., Disp: 30 g, Rfl: 0   Vitamin D , Ergocalciferol , (DRISDOL ) 1.25 MG (50000 UNIT) CAPS capsule, Take 1 capsule (50,000 Units total) by mouth every 7 (seven) days., Disp: 12 capsule, Rfl: 1  Observations/Objective: Patient is well-developed, well-nourished in no acute distress.  Resting comfortably at home.  Head is normocephalic, atraumatic.  No labored breathing.  Speech is clear and coherent with logical content.  Patient is alert and oriented at baseline.     Assessment and Plan: 1. Acute bacterial sinusitis (Primary) - doxycycline  (VIBRA -TABS) 100 MG tablet; Take 1 tablet (100 mg total) by mouth 2 (two) times daily.  Dispense: 14 tablet; Refill: 0  - Worsening symptoms that have not responded to OTC medications.  - Will give Doxycycline  - Continue allergy medications.  - Steam and humidifier can help - Stay well hydrated and get plenty of rest.  - Seek in person evaluation if no symptom improvement or if symptoms worsen  Follow Up Instructions: I discussed the assessment and treatment plan with the patient. The patient was provided an opportunity to ask questions and [name] were answered. The patient agreed with the plan and demonstrated an understanding of the instructions.  A copy of instructions were sent to the patient via MyChart unless otherwise noted below.    The patient was advised to call back or seek an in-person evaluation if the symptoms worsen or if the condition fails to improve as anticipated.    Jordan CHRISTELLA Dickinson, PA-C
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
## 2024-05-23 ENCOUNTER — Encounter: Payer: Self-pay | Admitting: Physical Therapy

## 2024-05-23 ENCOUNTER — Ambulatory Visit: Attending: Sports Medicine | Admitting: Physical Therapy

## 2024-05-23 DIAGNOSIS — M5459 Other low back pain: Secondary | ICD-10-CM | POA: Insufficient documentation

## 2024-05-23 DIAGNOSIS — M546 Pain in thoracic spine: Secondary | ICD-10-CM | POA: Diagnosis present

## 2024-05-23 DIAGNOSIS — R252 Cramp and spasm: Secondary | ICD-10-CM | POA: Insufficient documentation

## 2024-05-24 ENCOUNTER — Ambulatory Visit: Payer: Self-pay

## 2024-05-24 MED ORDER — AZITHROMYCIN 250 MG PO TABS: ORAL_TABLET | ORAL | 0 refills | Status: AC

## 2024-05-24 NOTE — Telephone Encounter (Signed)
 FYI Only or Action Required?: Action required by provider: clinical question for provider.  Patient was last seen in primary care on 05/22/2024 by Vivienne Delon HERO, PA-C.  Called Nurse Triage reporting Medication Reaction.  Symptoms began yesterday.  Interventions attempted: Nothing.  Symptoms are: unchanged.Started Doxycycline  per telehealth visit. Sunday started having headache, nausea,dizziness.Thinks it is from antibiotic. Please advise.  Triage Disposition: Call PCP Now  Patient/caregiver understands and will follow disposition?: Yes     Copied from CRM #[id number]. Topic: Clinical - Red Word Triage >> May 24, 2024  7:52 AM Laymon HERO wrote: Red Word that prompted transfer to Nurse Triage: started Doxycycline  on Sunday-bad headache before bed, still has it. Took tylonel around 2am today. feeling nauseous and dizzy all morning Reason for Disposition  [1] Caller has URGENT medicine question about med that primary care doctor (or NP/PA) or specialist prescribed AND [2] triager unable to answer question  Answer Assessment - Initial Assessment Questions 1. NAME of MEDICINE: What medicine(s) are you calling about?     Doxycycline  2. QUESTION: What is your question? (e.g., double dose of medicine, side effect)     Side effects 3. PRESCRIBER: Who prescribed the medicine? Reason: if prescribed by specialist, call should be referred to that group.     telehealth 4. SYMPTOMS: Do you have any symptoms? If Yes, ask: What symptoms are you having?  How bad are the symptoms (e.g., mild, moderate, severe)     Nausea, headache,dizzy 5. PREGNANCY:  Is there any chance that you are pregnant? When was your last menstrual period?     no  Protocols used: Medication Question Call-A-AH

## 2024-05-24 NOTE — Telephone Encounter (Signed)
 Spoke with Pt she stated she was having headache nausea after taking medication that was prescribed through virtual visit. After speaking with the PCP she instructed she calls  [facility] [phone]  For them to change the medication that was prescribed to her. During the visit. Pt was not evaluated by PCP for this condition.

## 2024-05-24 NOTE — Addendum Note (Signed)
 Addended by: MOISHE CHIQUITA HERO on: 05/24/2024 02:09 PM   Modules accepted: Orders

## 2024-05-24 NOTE — Progress Notes (Unsigned)
 [name] [address] [place] [address] Phone: [phone]   Assessment and Plan:     1. Chronic bilateral low back pain without sciatica (Primary) 2. Chronic bilateral thoracic back pain 3. Rib pain 4. Fibromyalgia 5. Right knee pain, chronic chronicity -Chronic with exacerbation, subsequent visit - Patient still experiencing musculoskeletal back pain.  Additional right knee pain without MOI.  Unclear etiology of ongoing pain, but musculoskeletal dysfunction, fibromyalgia are most likely differentials.  Left knee pain that was present at previous visit has resolved without specific treatment. - Right knee x-ray obtained at today's visit.  My interpretation: No acute fracture or dislocation. - Patient noticed minimal benefit after epidural CSI to thoracic spine in August 2025, and minimal relief with OMT so do not recommend repeat treatments at this time - Continue HEP and physical therapy for low back.  Start new HEP and physical therapy for right knee - Patient was previously on prolonged NSAID course.  Due to side effects, do not recommend prolonged NSAIDs - Use Tylenol  500 to 1000 mg tablets 2-3 times a day for day-to-day pain relief - Patient has history of binge eating episodes, so do not recommend prednisone  as this could trigger likely additional episodes - Continue Cymbalta  for chronic pain, fibromyalgia.  Continue follow-up with behavioral health - In the past, rheumatoid arthritis has been mentioned in chart. workup in 02/11/2017 with negative ANA, negative RF, negative CCP. her vitamin D  was low at 17, did have elevated inflammatory marker with sedimentation rate 56, CRP 6.9. CK was mildly elevated at 258, no proximal weakness. AVISE test in 01/2018: + ANA but ENA and ds DNA negative.  ANA in 2020 was negative.  I do not see diagnostic criteria for rheumatoid arthritis. - Continue gabapentin  100 to 200 mg nightly as  needed for pain relief   15 additional minutes spent for educating Therapeutic Home Exercise Program.  This included exercises focusing on stretching, strengthening, with focus on eccentric aspects.   Long term goals include an improvement in range of motion, strength, endurance as well as avoiding reinjury. Patient's frequency would include in 1-2 times a day, 3-5 times a week for a duration of 6-12 weeks. Proper technique shown and discussed handout in great detail with ATC.  All questions were discussed and answered.    Pertinent previous records reviewed include none   Follow Up: 4 weeks for reevaluation.  Can review x-ray images with patient in clinic.  If no improvement or worsening of symptoms, could consider CSI   Subjective:   I, [name], am serving as a neurosurgeon for Doctor [name]   Chief Complaint: upper and lower back pain    HPI:    05/11/23 Patient is a 48 year old female that is here for OMT . patient states that she has been having issues with her ribs. Has pain when she laughs , coughs or any twisting movement . She has extreme pain bilaterally . No MOI . Pain started around cancer treatments remission since 2018. Ibu and tylenol  do not help. She is able to reposition her self to help with the pain, and that will take the edge off. Hx of migraines. Pain does radiate to the back and under the rib cage in the front    06/26/2023 Patient states had improvement for a month then had a flare side stitch / that felt like everything shifted. She had a muscle spasm in her hand and  forearm    09/01/2023 Patient states still has rib pain   10/29/23 Patient states she continues to have upper back pain radiating to ribs.  She also endorses new lower back pain with symptoms radiating into leg after new workouts over the past several weeks.  No specific MOI   11/10/2023 Patient states low back pain has increased since last month    12/18/23 Patient states that the  exercises has helped some, does has pain still with a good laugh or bending or twisting certain ways. Was doing well with yoga.    03/28/2024 Patient states she is having a tingling sensation at injection site. 20% relief from injection    04/26/2024 Patient states one PT helped a little. Still has some tightness   05/25/2024 Patient states back is getting better. PT is helping. Has a rheumatology appointment in feb. But, right knee pain patellar pain . Does endorse antalgic gait pain for a while is trying to hold of until she sees rheumatology but the pain is increasing. Tylneol does not seem to be helping.    Relevant Historical Information: Rheumatoid arthritis, breast cancer, fibromyalgia   Additional pertinent review of systems negative.   Current Outpatient Medications:    albuterol  (VENTOLIN  HFA) 108 (90 Base) MCG/ACT inhaler, Inhale into the lungs every 6 (six) hours as needed for wheezing or shortness of breath., Disp: , Rfl:    azithromycin  (ZITHROMAX ) 250 MG tablet, Take 2 tablets on day 1, then 1 tablet daily on days 2 through 5, Disp: 6 tablet, Rfl: 0   busPIRone  (BUSPAR ) 5 MG tablet, TAKE 1 TABLET(5 MG) BY MOUTH THREE TIMES DAILY, Disp: 90 tablet, Rfl: 0   clobetasol  ointment (TEMOVATE ) 0.05 %, Apply 1 Application topically 2 (two) times daily. Apply as directed twice daily for up to 2 weeks as needed (Patient taking differently: Apply 1 Application topically as needed. Apply as directed twice daily for up to 2 weeks as needed), Disp: 60 g, Rfl: 0   DULoxetine  (CYMBALTA ) 60 MG capsule, Take 1 capsule (60 mg total) by mouth 2 (two) times daily., Disp: 30 capsule, Rfl: 0   EPINEPHrine  0.3 mg/0.3 mL IJ SOAJ injection, Inject 0.3 mg into the muscle as needed for anaphylaxis., Disp: 2 each, Rfl: 1   gabapentin  (NEURONTIN ) 100 MG capsule, Take 2 capsules (200 mg total) by mouth 2 (two) times daily., Disp: 120 capsule, Rfl: 0   hydrocortisone  (ANUSOL -HC) 25 MG suppository, Place 1  suppository (25 mg total) rectally 2 (two) times daily as needed for hemorrhoids or anal itching., Disp: 14 suppository, Rfl: 1   hydrOXYzine  (ATARAX ) 25 MG tablet, 1 tablet at bedtime as needed Orally Once a day for 30 day(s), Disp: 30 tablet, Rfl: 0   ibuprofen  (ADVIL ) 800 MG tablet, Take 800 mg by mouth every 8 (eight) hours as needed., Disp: , Rfl:    levothyroxine  (SYNTHROID ) 75 MCG tablet, Take 1 tablet (75 mcg total) by mouth daily before breakfast., Disp: 90 tablet, Rfl: 1   methotrexate  (RHEUMATREX) 2.5 MG tablet, SMARTSIG:5 Tablet(s) By Mouth Once a Week, Disp: , Rfl:    omeprazole  (PRILOSEC) 40 MG capsule, Take 1 capsule (40 mg total) by mouth 2 (two) times daily. Take in the morning and at dinnertime., Disp: 60 capsule, Rfl: 11   prazosin  (MINIPRESS ) 1 MG capsule, TAKE 1 CAPSULE(1 MG) BY MOUTH AT BEDTIME, Disp: 90 capsule, Rfl: 0   rizatriptan  (MAXALT ) 10 MG tablet, 1 tablet Orally Once a day for 1 day(s), Disp: ,  Rfl:    terbinafine  (LAMISIL ) 250 MG tablet, Take 1 tablet (250 mg total) by mouth daily., Disp: 30 tablet, Rfl: 0   traZODone  (DESYREL ) 50 MG tablet, Take 1 tablet (50 mg total) by mouth at bedtime as needed for sleep., Disp: 30 tablet, Rfl: 0   triamcinolone  cream (KENALOG ) 0.1 %, Apply topically 2 (two) times daily., Disp: 30 g, Rfl: 0   Vitamin D , Ergocalciferol , (DRISDOL ) 1.25 MG (50000 UNIT) CAPS capsule, Take 1 capsule (50,000 Units total) by mouth every 7 (seven) days., Disp: 12 capsule, Rfl: 1   Objective:     Vitals:   05/25/24 1435  Pulse: 97  SpO2: 100%  Weight: 155 lb (70.3 kg)  Height: 5' 4 (1.626 m)      Body mass index is 26.61 kg/m.    Physical Exam:    Gen: Appears well, nad, nontoxic and pleasant Psych: Alert and oriented, appropriate mood and affect Neuro: sensation intact, strength is 5/5 in upper and lower extremities, muscle tone wnl Skin: no susupicious lesions or rashes   Back - Normal skin, Spine with normal alignment and no deformity.      tenderness to vertebral process palpation.   Bilateral lumbar paraspinous muscles are   tender and without spasm  TTP gluteal musculature Straight leg raise negative Trendelenberg negative Piriformis Test negative Gait normal     Right knee: No swelling No deformity Neg fluid wave, joint milking ROM Flex 110, Ext 0 NTTP over the quad tendon, medial fem condyle, lat fem condyle, patella, patella tendon, tibial tuberostiy, fibular head, posterior fossa, pes anserine bursa, gerdy's tubercle, medial jt line, lateral jt line Neg anterior and posterior drawer Neg lachman Negative varus stress Negative valgus stress Negative McMurray Positive Thessaly  Gait normal    Electronically signed by:  [name] 3:11 PM 05/25/24
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
## 2024-05-25 ENCOUNTER — Ambulatory Visit: Admitting: Physical Therapy

## 2024-05-25 ENCOUNTER — Encounter: Payer: Self-pay | Admitting: Physical Therapy

## 2024-05-25 ENCOUNTER — Ambulatory Visit (INDEPENDENT_AMBULATORY_CARE_PROVIDER_SITE_OTHER)

## 2024-05-25 ENCOUNTER — Ambulatory Visit (INDEPENDENT_AMBULATORY_CARE_PROVIDER_SITE_OTHER): Admitting: Sports Medicine

## 2024-05-25 VITALS — HR 97 | Ht 64.0 in | Wt 155.0 lb

## 2024-05-25 DIAGNOSIS — G8929 Other chronic pain: Secondary | ICD-10-CM | POA: Diagnosis not present

## 2024-05-25 DIAGNOSIS — M797 Fibromyalgia: Secondary | ICD-10-CM | POA: Diagnosis not present

## 2024-05-25 DIAGNOSIS — R0789 Other chest pain: Secondary | ICD-10-CM

## 2024-05-25 DIAGNOSIS — M546 Pain in thoracic spine: Secondary | ICD-10-CM

## 2024-05-25 DIAGNOSIS — M25561 Pain in right knee: Secondary | ICD-10-CM | POA: Diagnosis not present

## 2024-05-25 DIAGNOSIS — M5459 Other low back pain: Secondary | ICD-10-CM

## 2024-05-25 DIAGNOSIS — M545 Low back pain, unspecified: Secondary | ICD-10-CM | POA: Diagnosis not present

## 2024-05-25 DIAGNOSIS — R252 Cramp and spasm: Secondary | ICD-10-CM

## 2024-05-25 NOTE — Therapy (Signed)
 [facility] [address] [place], [place], [address] Phone: [phone]   Fax:  [phone]  Patient Details  Name: [name] MRN: [id number] Date of Birth: November 03, 1975 Referring Provider:  [name], DO  Encounter Date: 05/25/2024 Patient arrived and reported she was not feeling well, but didn't want to cancel within 24 hours. PT informed her it was fine to stay home if she is sick.  Mliss Cummins, PT 05/25/24 3:46 PM   [facility] [address] [place], [place], [address] Phone: [phone]   Fax:  [phone]

## 2024-05-25 NOTE — Patient Instructions (Signed)
 Knee HEP   PT referral   Tylenol , voltaren  gel, ibuprofen  as needed   Xrays on the way out   4 week follow up

## 2024-05-30 ENCOUNTER — Ambulatory Visit: Payer: Self-pay | Admitting: Sports Medicine

## 2024-05-30 ENCOUNTER — Ambulatory Visit

## 2024-05-30 VITALS — BP 139/85 | HR 76 | Temp 97.7°F | Resp 13 | Ht 63.0 in | Wt 196.4 lb

## 2024-05-30 DIAGNOSIS — Z111 Encounter for screening for respiratory tuberculosis: Secondary | ICD-10-CM | POA: Diagnosis present

## 2024-05-30 DIAGNOSIS — Z1159 Encounter for screening for other viral diseases: Secondary | ICD-10-CM | POA: Diagnosis not present

## 2024-05-30 DIAGNOSIS — M0579 Rheumatoid arthritis with rheumatoid factor of multiple sites without organ or systems involvement: Secondary | ICD-10-CM | POA: Insufficient documentation

## 2024-05-30 DIAGNOSIS — M1380 Other specified arthritis, unspecified site: Secondary | ICD-10-CM | POA: Diagnosis present

## 2024-05-30 DIAGNOSIS — M810 Age-related osteoporosis without current pathological fracture: Secondary | ICD-10-CM | POA: Insufficient documentation

## 2024-05-30 NOTE — Patient Instructions (Addendum)
 WALK-IN LAB HOURS  Monday through Thursday from 8:00 am -12:30 pm and 1:00 pm-4:30 pm and Friday from 8:00 am-12:00 pm.  Patients with office visits requiring labs will be seen before walk-in labs.  You may encounter longer than normal wait times. Please allow additional time. Wait times may be shorter on  Monday and Thursday afternoons.  We do not book appointments for walk-in labs. We appreciate your patience and understanding with our staff.      [facility] [address] [place], [place]
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
## 2024-05-31 ENCOUNTER — Ambulatory Visit (INDEPENDENT_AMBULATORY_CARE_PROVIDER_SITE_OTHER): Admitting: Student in an Organized Health Care Education/Training Program

## 2024-05-31 ENCOUNTER — Other Ambulatory Visit: Payer: Self-pay | Admitting: *Deleted

## 2024-05-31 ENCOUNTER — Ambulatory Visit
Admission: RE | Admit: 2024-05-31 | Discharge: 2024-05-31 | Disposition: A | Discharge status: Home / Self Care | Source: Ambulatory Visit

## 2024-05-31 DIAGNOSIS — M1380 Other specified arthritis, unspecified site: Secondary | ICD-10-CM

## 2024-05-31 DIAGNOSIS — M0579 Rheumatoid arthritis with rheumatoid factor of multiple sites without organ or systems involvement: Secondary | ICD-10-CM

## 2024-05-31 DIAGNOSIS — Z111 Encounter for screening for respiratory tuberculosis: Secondary | ICD-10-CM

## 2024-05-31 DIAGNOSIS — F411 Generalized anxiety disorder: Secondary | ICD-10-CM

## 2024-05-31 DIAGNOSIS — F410 Panic disorder [episodic paroxysmal anxiety] without agoraphobia: Secondary | ICD-10-CM

## 2024-05-31 DIAGNOSIS — Z1159 Encounter for screening for other viral diseases: Secondary | ICD-10-CM

## 2024-05-31 DIAGNOSIS — F331 Major depressive disorder, recurrent, moderate: Secondary | ICD-10-CM | POA: Diagnosis not present

## 2024-05-31 MED ORDER — TRAZODONE HCL 50 MG PO TABS: 50.0000 mg | ORAL_TABLET | Freq: Every evening | ORAL | 0 refills | Status: DC | PRN

## 2024-05-31 MED ORDER — DULOXETINE HCL 60 MG PO CPEP: 60.0000 mg | ORAL_CAPSULE | Freq: Two times a day (BID) | ORAL | 2 refills | Status: DC

## 2024-05-31 MED ORDER — HYDROXYZINE HCL 25 MG PO TABS: 25.0000 mg | ORAL_TABLET | Freq: Every evening | ORAL | 0 refills | Status: DC | PRN

## 2024-05-31 NOTE — Addendum Note (Signed)
 Addended by: MERCY DOMINO A on: 05/31/2024 01:00 PM   Modules accepted: Level of Service

## 2024-05-31 NOTE — Patient Instructions (Signed)
 Outpatient Therapy and Psychiatry Resources for Patients: Your psychiatric needs would be well-served by consultation and regular meetings with an outpatient therapist to assist you with your mood-related conditions. Here are a series of links for finding a therapist.     Includes links to the following: [facility] (http://[email or web address]/) (only for [facility] and please reserve for uninsured) [facility] (http://blankenship-martinez.[email or web address]/) [employer] Today Special educational needs teacher (https://www.psychologytoday.com/us/therapists) [employer] Today Support Group Finder (https://www.psychologytoday.com/us/groups/) [facility] Location (https://carolinabehavioralcare.com/staff-location/[place]/) [facility] [place] - Support Group Finder - (RecordDebt.fi) [facility] [employer] - Lexicographer (https://fspcares.org/contact/) [employer] [facility] [place] - [facility] (https://namiguilford.org/support-and-education/support-groups/) Interior and spatial designer Health - Affiliated with [facility] (https://www.[place].com/lb/locations/profile/[facility]-[place]-behavioral-med[facility]-at-[address]/) Dept of Health and Human Services - Find a mental health facility (http://lester.info/)
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
## 2024-06-01 LAB — SEDIMENTATION RATE: Sed Rate: 19 mm/h (ref 0–20)

## 2024-06-03 LAB — QUANTIFERON-TB GOLD PLUS
Mitogen-NIL: 8.54 [IU]/mL
NIL: 0.01 [IU]/mL
QuantiFERON-TB Gold Plus: NEGATIVE
TB1-NIL: 0 [IU]/mL
TB2-NIL: 0 [IU]/mL

## 2024-06-03 LAB — HEPATITIS B SURFACE ANTIGEN: Hepatitis B Surface Ag: NONREACTIVE

## 2024-06-03 LAB — ANGIOTENSIN CONVERTING ENZYME: Angiotensin-Converting Enzyme: 14 U/L (ref 9–67)

## 2024-06-03 LAB — HEPATITIS C ANTIBODY: Hepatitis C Ab: NONREACTIVE

## 2024-06-03 LAB — CYCLIC CITRUL PEPTIDE ANTIBODY, IGG: Cyclic Citrullin Peptide Ab: <16 U

## 2024-06-03 LAB — RHEUMATOID FACTOR: Rheumatoid fact SerPl-aCnc: 10 [IU]/mL (ref <14)

## 2024-06-03 LAB — C-REACTIVE PROTEIN: CRP: <3 mg/L (ref <8.0)

## 2024-06-03 LAB — HEPATITIS B CORE ANTIBODY, IGM: Hep B C IgM: NONREACTIVE

## 2024-06-06 ENCOUNTER — Ambulatory Visit: Admitting: Physical Therapy

## 2024-06-08 ENCOUNTER — Ambulatory Visit: Admitting: Physical Therapy

## 2024-06-13 ENCOUNTER — Ambulatory Visit: Admitting: Physical Therapy
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
## 2024-06-14 ENCOUNTER — Ambulatory Visit

## 2024-06-14 VITALS — BP 136/84 | HR 87 | Temp 97.3°F | Resp 14 | Ht 63.0 in | Wt 199.4 lb

## 2024-06-14 DIAGNOSIS — M797 Fibromyalgia: Secondary | ICD-10-CM

## 2024-06-14 DIAGNOSIS — M25561 Pain in right knee: Secondary | ICD-10-CM

## 2024-06-14 DIAGNOSIS — M1380 Other specified arthritis, unspecified site: Secondary | ICD-10-CM | POA: Diagnosis not present

## 2024-06-14 MED ORDER — PREDNISONE 10 MG PO TABS: ORAL_TABLET | ORAL | 0 refills | Status: AC

## 2024-06-14 MED ORDER — FOLIC ACID 1 MG PO TABS: 2.0000 mg | ORAL_TABLET | Freq: Every day | ORAL | 1 refills | Status: AC

## 2024-06-14 NOTE — Patient Instructions (Signed)
 Methotrexate Injection What is this medication? METHOTREXATE (METH oh TREX ate) treats inflammatory conditions such as arthritis and psoriasis. It works by decreasing inflammation, which can reduce pain and prevent long-term injury to the joints and skin. It may also be used to treat some types of cancer. It works by slowing down the growth of cancer cells. This medicine may be used for other purposes; ask your health care provider or pharmacist if you have questions. What should I tell my care team before I take this medication? They need to know if you have any of these conditions: Fluid in the stomach area or lungs Frequently drink alcohol Infection or immune system problems Kidney disease Liver disease Low blood counts (white cells, platelets, or red blood cells) Lung disease Recent or ongoing radiation Recent or upcoming vaccine Stomach ulcers Ulcerative colitis An unusual or allergic reaction to methotrexate, other medications, foods, dyes, or preservatives Pregnant or trying to get pregnant Breastfeeding How should I use this medication? This medication is for infusion into a vein or for injection into muscle or into the spinal fluid (whichever applies). It is usually given in a hospital or clinic setting. In rare cases, you might get this medication at home. You will be taught how to give this medication. Use exactly as directed. Take your medication at regular intervals. Do not take your medication more often than directed. If this medication is used for arthritis or psoriasis, it should be taken weekly, NOT daily. It is important that you put your used needles and syringes in a special sharps container. Do not put them in a trash can. If you do not have a sharps container, call your pharmacist or care team to get one. Talk to your care team about the use of this medication in children. While this medication may be prescribed for children as young as 2 years for selected conditions,  precautions do apply. Overdosage: If you think you have taken too much of this medicine contact a poison control center or emergency room at once. NOTE: This medicine is only for you. Do not share this medicine with others. What if I miss a dose? It is important not to miss your dose. Call your care team if you are unable to keep an appointment. If you give yourself the medication, and you miss a dose, talk with your care team. Do not take double or extra doses. What may interact with this medication? Do not take this medication with any of the following: Acitretin Probenecid This medication may also interact with the following: Aspirin or aspirin-like medications Azathioprine Certain antibiotics, such as gentamicin, penicillin, tetracycline, vancomycin Certain medications that treat or prevent blood clots, such as warfarin, apixaban, dabigatran, rivaroxaban Certain medications for stomach problems, such as esomeprazole, omeprazole, pantoprazole Dapsone Hydroxychloroquine Live virus vaccines Medications for viral infections, such as acyclovir, cidofovir, foscarnet, ganciclovir Mercaptopurine NSAIDs, medications for pain and inflammation, such as ibuprofen or naproxen Phenytoin Pyrimethamine Retinoids, such as isotretinoin or tretinoin Sulfonamides, such as sulfasalazine or trimethoprim; sulfamethoxazole Theophylline This list may not describe all possible interactions. Give your health care provider a list of all the medicines, herbs, non-prescription drugs, or dietary supplements you use. Also tell them if you smoke, drink alcohol, or use illegal drugs. Some items may interact with your medicine. What should I watch for while using this medication? This medication may make you feel generally unwell. This is not uncommon as chemotherapy can affect healthy cells as well as cancer cells. Report any side effects.  Continue your course of treatment even though you feel ill unless your care  team tells you to stop. Your condition will be monitored carefully while you are receiving this medication. Avoid alcoholic drinks. This medication can cause serious side effects. To reduce the risk, your care team may give you other medications to take before receiving this one. Be sure to follow the directions from your care team. This medication can make you more sensitive to the sun. Keep out of the sun. If you cannot avoid being in the sun, wear protective clothing and use sunscreen. Do not use sun lamps or tanning beds/booths. You may get drowsy or dizzy. Do not drive, use machinery, or do anything that needs mental alertness until you know how this medication affects you. Do not stand or sit up quickly, especially if you are an older patient. This reduces the risk of dizzy or fainting spells. You may need blood work while you are taking this medication. Call your care team for advice if you get a fever, chills or sore throat, or other symptoms of a cold or flu. Do not treat yourself. This medication decreases your body's ability to fight infections. Try to avoid being around people who are sick. This medication may increase your risk to bruise or bleed. Call your care team if you notice any unusual bleeding. Be careful brushing or flossing your teeth or using a toothpick because you may get an infection or bleed more easily. If you have any dental work done, tell your dentist you are receiving this medication Check with your care team if you get an attack of severe diarrhea, nausea and vomiting, or if you sweat a lot. The loss of too much body fluid can make it dangerous for you to take this medication. Talk to your care team about your risk of cancer. You may be more at risk for certain types of cancers if you take this medication. Do not become pregnant while taking this medication or for 6 months after stopping it. Women should inform their care team if they wish to become pregnant or think  they might be pregnant. Men should not father a child while taking this medication and for 3 months after stopping it. There is potential for serious harm to an unborn child. Talk to your care team for more information. Do not breast-feed an infant while taking this medication or for 1 week after stopping it. This medication may make it more difficult to get pregnant or father a child. Talk to your care team if you are concerned about your fertility. What side effects may I notice from receiving this medication? Side effects that you should report to your care team as soon as possible: Allergic reactions--skin rash, itching, hives, swelling of the face, lips, tongue, or throat Blood clot--pain, swelling, or warmth in the leg, shortness of breath, chest pain Dry cough, shortness of breath or trouble breathing Infection--fever, chills, cough, sore throat, wounds that don't heal, pain or trouble when passing urine, general feeling of discomfort or being unwell Kidney injury--decrease in the amount of urine, swelling of the ankles, hands, or feet Liver injury--right upper belly pain, loss of appetite, nausea, light-colored stool, dark yellow or brown urine, yellowing of the skin or eyes, unusual weakness or fatigue Low red blood cell count--unusual weakness or fatigue, dizziness, headache, trouble breathing Redness, blistering, peeling, or loosening of the skin, including inside the mouth Seizures Unusual bruising or bleeding Side effects that usually do not require medical  attention (report to your care team if they continue or are bothersome): Diarrhea Dizziness Hair loss Nausea Pain, redness, or swelling with sores inside the mouth or throat Vomiting This list may not describe all possible side effects. Call your doctor for medical advice about side effects. You may report side effects to FDA at 1-800-FDA-1088. Where should I keep my medication? This medication is given in a hospital or clinic.  It will not be stored at home. NOTE: This sheet is a summary. It may not cover all possible information. If you have questions about this medicine, talk to your doctor, pharmacist, or health care provider.  2024 Elsevier/Gold Standard (2022-11-26 00:00:00)

## 2024-06-15 ENCOUNTER — Ambulatory Visit: Attending: Sports Medicine | Admitting: Physical Therapy

## 2024-06-15 ENCOUNTER — Encounter: Payer: Self-pay | Admitting: Physical Therapy

## 2024-06-15 DIAGNOSIS — M25561 Pain in right knee: Secondary | ICD-10-CM | POA: Diagnosis present

## 2024-06-15 DIAGNOSIS — M5459 Other low back pain: Secondary | ICD-10-CM | POA: Insufficient documentation

## 2024-06-15 DIAGNOSIS — G8929 Other chronic pain: Secondary | ICD-10-CM | POA: Insufficient documentation

## 2024-06-15 DIAGNOSIS — R252 Cramp and spasm: Secondary | ICD-10-CM | POA: Insufficient documentation

## 2024-06-15 DIAGNOSIS — M546 Pain in thoracic spine: Secondary | ICD-10-CM | POA: Insufficient documentation
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
## 2024-06-16 ENCOUNTER — Telehealth: Payer: Self-pay | Admitting: Pharmacist

## 2024-06-16 NOTE — Telephone Encounter (Addendum)
 Submitted a Prior Authorization request to [facility] for RASUVO  via CoverMyMeds. Will update once we receive a response.  Key: [id number]    ----- Message from Nurse Alfonso DEL sent at 06/14/2024  4:15 PM EST ----- Per Dr. Luba, please apply for Rasuvo . Patient has tried and failed vial and syringe Methotrexate . Patient also tried and failed oral Methotrexate . She had GI side effects.

## 2024-06-20 ENCOUNTER — Ambulatory Visit: Admitting: Physical Therapy

## 2024-06-20 ENCOUNTER — Encounter: Payer: Self-pay | Admitting: Physical Therapy

## 2024-06-20 DIAGNOSIS — R252 Cramp and spasm: Secondary | ICD-10-CM

## 2024-06-20 DIAGNOSIS — G8929 Other chronic pain: Secondary | ICD-10-CM

## 2024-06-20 DIAGNOSIS — M5459 Other low back pain: Secondary | ICD-10-CM | POA: Diagnosis not present

## 2024-06-20 DIAGNOSIS — M546 Pain in thoracic spine: Secondary | ICD-10-CM

## 2024-06-20 NOTE — Telephone Encounter (Signed)
 Received a fax regarding Prior Authorization from [facility] for RASUVO . Authorization has been DENIED because patient must try two formulary drugs.  Submitted an URGENT appeal to [facility] for RASUVO .  Fax: [phone] Phone: [phone]

## 2024-06-20 NOTE — Addendum Note (Signed)
 Addended by: Delayne Sanzo J on: 06/20/2024 08:38 AM   Modules accepted: Orders
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
## 2024-06-21 ENCOUNTER — Encounter (HOSPITAL_BASED_OUTPATIENT_CLINIC_OR_DEPARTMENT_OTHER): Payer: Self-pay | Admitting: Physical Therapy

## 2024-06-21 ENCOUNTER — Encounter (HOSPITAL_BASED_OUTPATIENT_CLINIC_OR_DEPARTMENT_OTHER): Admitting: Physical Therapy

## 2024-06-21 DIAGNOSIS — M546 Pain in thoracic spine: Secondary | ICD-10-CM

## 2024-06-21 DIAGNOSIS — R252 Cramp and spasm: Secondary | ICD-10-CM | POA: Diagnosis present

## 2024-06-21 DIAGNOSIS — G8929 Other chronic pain: Secondary | ICD-10-CM | POA: Insufficient documentation

## 2024-06-21 DIAGNOSIS — M5459 Other low back pain: Secondary | ICD-10-CM | POA: Diagnosis present

## 2024-06-21 DIAGNOSIS — M25561 Pain in right knee: Secondary | ICD-10-CM | POA: Insufficient documentation

## 2024-06-21 NOTE — Progress Notes (Unsigned)
 Ben Jackson D.[facility] [address] [place] [address] Phone: [phone]   Assessment and Plan:     1. Right knee pain, unspecified chronicity (Primary) 2. Chronic bilateral low back pain without sciatica 3. Chronic bilateral thoracic back pain 4. Chronic pain of right knee 5. Fibromyalgia -Chronic with exacerbation, subsequent visit - Patient continues to experience multiple areas of musculoskeletal pain with most prominent being right knee.  Additional pains in bilateral hands, bilateral feet.  Currently unclear etiology of ongoing pain, but most consistent with fibromyalgia. - Reviewed x-rays of right knee, bilateral hand, bilateral feet with patient at today's visit.  X-rays are completely unremarkable with no signs of early degenerative changes.  I do not believe that spondyloarthropathy is contributing to patient's current pain. - Recommend further evaluation with right knee MRI due to failure to improve despite >6 weeks of conservative therapy, unremarkable x-rays, pain with day-to-day activity, pain >6/10 - Use Tylenol  500 to 1000 mg tablets 2-3 times a day for day-to-day pain relief - May increase to gabapentin  100 to 200 mg 1-2 times per day as needed for pain relief.  May call for refill if needed - Continue Cymbalta  60 mg twice daily for chronic pain, fibromyalgia.  Continue follow-up with [facility] - Patient was previously on prolonged NSAID course without significant benefit.  Do not recommend continuing prolonged NSAIDs due to potential side effects. - Patient has prednisone  Dosepak that she may use as needed for pain flare prescribed rheumatology.  Patient has also started low-dose injectable methotrexate  with rheumatology guidance.  Recommend continuing follow-up with rheumatology. - Continue HEP and physical therapy - In the past, rheumatoid arthritis has been mentioned in chart. workup in 02/11/2017 with negative ANA,  negative RF, negative CCP. her vitamin D  was low at 17, did have elevated inflammatory marker with sedimentation rate 56, CRP 6.9. CK was mildly elevated at 258, no proximal weakness. AVISE test in 01/2018: + ANA but ENA and ds DNA negative. ANA in 2020 was negative. I do not see diagnostic criteria for rheumatoid arthritis.     Pertinent previous records reviewed include rheumatology notes, physical therapy notes   Follow Up: 1 week after MRI to review results and discuss treatment plan.  Could consider CSI   Subjective:   I, Claretha Schimke am a scribe for Dr. Leonce.   Chief Complaint: upper and lower back pain    HPI:    05/11/23 Patient is a 48 year old female that is here for OMT . patient states that she has been having issues with her ribs. Has pain when she laughs , coughs or any twisting movement . She has extreme pain bilaterally . No MOI . Pain started around cancer treatments remission since 2018. Ibu and tylenol  do not help. She is able to reposition her self to help with the pain, and that will take the edge off. Hx of migraines. Pain does radiate to the back and under the rib cage in the front    06/26/2023 Patient states had improvement for a month then had a flare side stitch / that felt like everything shifted. She had a muscle spasm in her hand and forearm    09/01/2023 Patient states still has rib pain   10/29/23 Patient states she continues to have upper back pain radiating to ribs.  She also endorses new lower back pain with symptoms radiating into leg after new workouts over the past several weeks.  No specific  MOI   11/10/2023 Patient states low back pain has increased since last month    12/18/23 Patient states that the exercises has helped some, does has pain still with a good laugh or bending or twisting certain ways. Was doing well with yoga.    03/28/2024 Patient states she is having a tingling sensation at injection site. 20% relief from injection     04/26/2024 Patient states one PT helped a little. Still has some tightness    05/25/2024 Patient states back is getting better. PT is helping. Has a rheumatology appointment in feb. But, right knee pain patellar pain . Does endorse antalgic gait pain for a while is trying to hold of until she sees rheumatology but the pain is increasing. Tylneol does not seem to be helping.   06/22/2024 Patient states that she has some joint pain today but nothing out of the norm.   Relevant Historical Information: Rheumatoid arthritis, breast cancer, fibromyalgia     Additional pertinent review of systems negative.  Current Medications[1]   Objective:     Vitals:   06/22/24 1506  BP: 126/70  Pulse: 91  SpO2: 99%  Weight: 202 lb (91.6 kg)  Height: 5' 3 (1.6 m)      Body mass index is 35.78 kg/m.    Physical Exam:    Right knee: No swelling No deformity Neg fluid wave, joint milking ROM Flex 110, Ext 0 NTTP over the quad tendon, medial fem condyle, lat fem condyle, patella, patella tendon, tibial tuberostiy, fibular head, posterior fossa, pes anserine bursa, gerdy's tubercle, medial jt line, lateral jt line Neg anterior and posterior drawer Neg lachman Negative varus stress Negative valgus stress Negative McMurray Positive Thessaly   Gait normal     Electronically signed by:  Odis Mace D.[facility] 3:52 PM 06/22/2024     [1]  Current Outpatient Medications:    albuterol  (VENTOLIN  HFA) 108 (90 Base) MCG/ACT inhaler, Inhale into the lungs every 6 (six) hours as needed for wheezing or shortness of breath., Disp: , Rfl:    clobetasol  ointment (TEMOVATE ) 0.05 %, Apply 1 Application topically 2 (two) times daily. Apply as directed twice daily for up to 2 weeks as needed (Patient taking differently: Apply 1 Application topically as needed. Apply as directed twice daily for up to 2 weeks as needed), Disp: 60 g, Rfl: 0   DULoxetine  (CYMBALTA ) 60 MG capsule,  Take 1 capsule (60 mg total) by mouth 2 (two) times daily., Disp: 60 capsule, Rfl: 2   EPINEPHrine  0.3 mg/0.3 mL IJ SOAJ injection, Inject 0.3 mg into the muscle as needed for anaphylaxis., Disp: 2 each, Rfl: 1   folic acid  (FOLVITE ) 1 MG tablet, Take 2 tablets (2 mg total) by mouth daily., Disp: 180 tablet, Rfl: 1   gabapentin  (NEURONTIN ) 100 MG capsule, Take 2 capsules (200 mg total) by mouth 2 (two) times daily., Disp: 120 capsule, Rfl: 0   hydrocortisone  (ANUSOL -HC) 25 MG suppository, Place 1 suppository (25 mg total) rectally 2 (two) times daily as needed for hemorrhoids or anal itching., Disp: 14 suppository, Rfl: 1   hydrOXYzine  (ATARAX ) 25 MG tablet, Take 1 tablet (25 mg total) by mouth at bedtime as needed. 1 tablet at bedtime as needed Orally Once a day for 30 day(s), Disp: 90 tablet, Rfl: 0   ibuprofen  (ADVIL ) 800 MG tablet, Take 800 mg by mouth every 8 (eight) hours as needed., Disp: , Rfl:    levothyroxine  (SYNTHROID ) 75 MCG tablet, Take 1  tablet (75 mcg total) by mouth daily before breakfast., Disp: 90 tablet, Rfl: 1   methotrexate  (RHEUMATREX) 2.5 MG tablet, SMARTSIG:5 Tablet(s) By Mouth Once a Week (Patient not taking: Reported on 06/14/2024), Disp: , Rfl:    omeprazole  (PRILOSEC) 40 MG capsule, Take 1 capsule (40 mg total) by mouth 2 (two) times daily. Take in the morning and at dinnertime., Disp: 60 capsule, Rfl: 11   predniSONE  (DELTASONE ) 10 MG tablet, Take 2 tablets (20 mg total) by mouth daily with breakfast for 3 days, THEN 1.5 tablets (15 mg total) daily with breakfast for 3 days, THEN 1 tablet (10 mg total) daily with breakfast for 3 days, THEN 0.5 tablets (5 mg total) daily with breakfast for 3 days., Disp: 15 tablet, Rfl: 0   rizatriptan  (MAXALT ) 10 MG tablet, 1 tablet Orally Once a day for 1 day(s), Disp: , Rfl:    terbinafine  (LAMISIL ) 250 MG tablet, Take 1 tablet (250 mg total) by mouth daily., Disp: 30 tablet, Rfl: 0   traZODone  (DESYREL ) 50 MG tablet, Take 1 tablet (50 mg  total) by mouth at bedtime as needed for sleep., Disp: 90 tablet, Rfl: 0   triamcinolone  cream (KENALOG ) 0.1 %, Apply topically 2 (two) times daily., Disp: 30 g, Rfl: 0   Vitamin D , Ergocalciferol , (DRISDOL ) 1.25 MG (50000 UNIT) CAPS capsule, Take 1 capsule (50,000 Units total) by mouth every 7 (seven) days., Disp: 12 capsule, Rfl: 1

## 2024-06-22 ENCOUNTER — Ambulatory Visit: Admitting: Sports Medicine

## 2024-06-22 ENCOUNTER — Other Ambulatory Visit (HOSPITAL_COMMUNITY): Payer: Self-pay

## 2024-06-22 VITALS — BP 126/70 | HR 91 | Ht 63.0 in | Wt 202.0 lb

## 2024-06-22 DIAGNOSIS — M25561 Pain in right knee: Secondary | ICD-10-CM | POA: Diagnosis not present

## 2024-06-22 DIAGNOSIS — M797 Fibromyalgia: Secondary | ICD-10-CM

## 2024-06-22 DIAGNOSIS — G8929 Other chronic pain: Secondary | ICD-10-CM | POA: Diagnosis not present

## 2024-06-22 DIAGNOSIS — M546 Pain in thoracic spine: Secondary | ICD-10-CM | POA: Diagnosis not present

## 2024-06-22 DIAGNOSIS — M545 Low back pain, unspecified: Secondary | ICD-10-CM

## 2024-06-22 NOTE — Patient Instructions (Addendum)
 Right knee Mri without contrast to [facility]. Follow up 1 week after MRI.   For neurologic pain, recommend gabapentin  100 to 200 mg 1-2 times a day as needed.  Call for refill if needed.

## 2024-06-22 NOTE — Telephone Encounter (Signed)
 Patient advised Rasuvo  has been approved. Patient advised copay for 84 days supply is $4.80. Patient advised Can send rx to her local pharmacy. They may fill it or they may transfer to their specialty pharmacy (she has Medicaid so does not have pharmacy restriction. It all depends on if local pharmacy wants to order it for her.). Patient states she would like to use the [facility] for this prescription.   Per office note on 06/14/2024: After discussion with patient, she wishes to proceed with SubQ 0.6mL Methotrexate  weekly w/ 2mg  folic acid  daily.

## 2024-06-22 NOTE — Telephone Encounter (Signed)
 Received notification from [facility] MEDICAID regarding a prior authorization for RASUVO . Authorization has been APPROVED from 06/20/2024 until further notice. Approval letter sent to scan center.  Per test claim, copay for 84 days supply is $4.80  Authorization # [id number]  Can send rx to her local pharmacy. They may fill it or they may transfer to their specialty pharmacy (she has Medicaid so does not have pharmacy restriction. It all depends on if local pharmacy wants to order it for her.)  Sherry Pennant, PharmD, MPH, BCPS, CPP Clinical Pharmacist

## 2024-06-22 NOTE — Addendum Note (Signed)
 Addended by: CENA ALFONSO CROME on: 06/22/2024 02:42 PM   Modules accepted: Orders

## 2024-06-28 ENCOUNTER — Other Ambulatory Visit (HOSPITAL_COMMUNITY): Payer: Self-pay

## 2024-06-28 ENCOUNTER — Other Ambulatory Visit: Payer: Self-pay

## 2024-06-28 MED ORDER — RASUVO 15 MG/0.3ML ~~LOC~~ SOAJ
15.0000 mg | SUBCUTANEOUS | 0 refills | Status: AC
  Filled 2024-06-28: qty 3.6, 28d supply, fill #0
  Filled 2024-07-15: qty 3.6, 84d supply, fill #0

## 2024-06-28 NOTE — Addendum Note (Signed)
 Addended by: LUBA STABS A on: 06/28/2024 10:10 AM   Modules accepted: Orders

## 2024-07-05 ENCOUNTER — Other Ambulatory Visit: Payer: Self-pay

## 2024-07-11 ENCOUNTER — Other Ambulatory Visit (HOSPITAL_COMMUNITY): Payer: Self-pay

## 2024-07-13 ENCOUNTER — Ambulatory Visit (HOSPITAL_COMMUNITY): Admitting: Student in an Organized Health Care Education/Training Program

## 2024-07-15 ENCOUNTER — Other Ambulatory Visit (HOSPITAL_COMMUNITY): Payer: Self-pay

## 2024-07-18 ENCOUNTER — Ambulatory Visit: Admitting: Physical Therapy

## 2024-07-20 ENCOUNTER — Encounter (HOSPITAL_BASED_OUTPATIENT_CLINIC_OR_DEPARTMENT_OTHER): Payer: Self-pay

## 2024-07-20 ENCOUNTER — Ambulatory Visit (HOSPITAL_BASED_OUTPATIENT_CLINIC_OR_DEPARTMENT_OTHER): Admitting: Physical Therapy

## 2024-08-01 ENCOUNTER — Ambulatory Visit: Admitting: Physical Therapy

## 2024-08-03 ENCOUNTER — Telehealth (HOSPITAL_COMMUNITY): Admitting: Student in an Organized Health Care Education/Training Program

## 2024-08-03 ENCOUNTER — Ambulatory Visit (HOSPITAL_BASED_OUTPATIENT_CLINIC_OR_DEPARTMENT_OTHER): Admitting: Physical Therapy

## 2024-08-03 DIAGNOSIS — F99 Mental disorder, not otherwise specified: Secondary | ICD-10-CM

## 2024-08-03 DIAGNOSIS — F411 Generalized anxiety disorder: Secondary | ICD-10-CM

## 2024-08-03 DIAGNOSIS — F410 Panic disorder [episodic paroxysmal anxiety] without agoraphobia: Secondary | ICD-10-CM

## 2024-08-03 DIAGNOSIS — F502 Bulimia nervosa, unspecified: Secondary | ICD-10-CM | POA: Diagnosis not present

## 2024-08-03 DIAGNOSIS — F331 Major depressive disorder, recurrent, moderate: Secondary | ICD-10-CM | POA: Diagnosis not present

## 2024-08-03 DIAGNOSIS — F5105 Insomnia due to other mental disorder: Secondary | ICD-10-CM

## 2024-08-03 MED ORDER — DULOXETINE HCL 60 MG PO CPEP: 120.0000 mg | ORAL_CAPSULE | Freq: Every day | ORAL | 2 refills | Status: AC

## 2024-08-03 MED ORDER — HYDROXYZINE HCL 25 MG PO TABS: 25.0000 mg | ORAL_TABLET | Freq: Every evening | ORAL | 0 refills | Status: AC | PRN

## 2024-08-03 MED ORDER — TRAZODONE HCL 50 MG PO TABS: 50.0000 mg | ORAL_TABLET | Freq: Every evening | ORAL | 0 refills | Status: AC | PRN

## 2024-08-03 MED ORDER — LAMOTRIGINE 25 MG PO TABS: 50.0000 mg | ORAL_TABLET | Freq: Every day | ORAL | 0 refills | Status: AC

## 2024-08-03 MED ORDER — LAMOTRIGINE 25 MG PO TABS: ORAL_TABLET | ORAL | 0 refills | Status: AC
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
## 2024-08-03 NOTE — Patient Instructions (Addendum)
 [facility] Patient Name: [name] Date of Visit: [08/03/24  Provider Name: Dr. Homer   Dear [name], it was a pleasure seeing you today, and we appreciate the opportunity to care for you.   These are the changes we have made to your medications:  Please start taking your Cymbalta  120 mg daily in the morning Continue taking your trazodone  50 mg in the evening.  Our goal is to aim for 7-8 hours of sleep every night.  Please reach out if you continue to sleep approximately 4 hours nightly We are starting you on Lamictal  to help enhance the effects of your antidepressant please follow the schedule below: Start taking Lamictal  25 mg daily for 2 weeks Then increase taking your Lamictal  to 50 mg daily for 2 weeks -If you miss more than 3 days, please contact us  to restart the titration.  If you miss a day, do not double the dose the next day.   Lifestyle Recommendations Sleep Hygiene: Aim for 7-9 hours of quality sleep each night. Establish a regular sleep routine and create a restful environment. Stress Management: Practice stress-reducing techniques such as mindfulness, meditation, deep breathing exercises, or hobbies that you enjoy. Smoking Cessation: If you smoke, consider quitting. We can provide resources and support to help you. Alcohol Consumption: Limit alcohol intake to moderate levels--up to one drink per day for women and up to two drinks per day for men. Routine Health Screenings: Stay up-to-date with routine health screenings and vaccinations as recommended.

## 2024-08-15 ENCOUNTER — Ambulatory Visit: Admitting: Physical Therapy

## 2024-08-17 ENCOUNTER — Ambulatory Visit (HOSPITAL_BASED_OUTPATIENT_CLINIC_OR_DEPARTMENT_OTHER): Admitting: Physical Therapy

## 2024-08-19 ENCOUNTER — Ambulatory Visit

## 2024-08-19 ENCOUNTER — Ambulatory Visit (HOSPITAL_COMMUNITY)

## 2024-08-24 ENCOUNTER — Ambulatory Visit: Admitting: Physical Medicine and Rehabilitation

## 2024-08-29 ENCOUNTER — Ambulatory Visit: Admitting: Physical Therapy

## 2024-08-31 ENCOUNTER — Ambulatory Visit (HOSPITAL_BASED_OUTPATIENT_CLINIC_OR_DEPARTMENT_OTHER): Admitting: Physical Therapy

## 2024-09-05 ENCOUNTER — Ambulatory Visit: Admitting: Physical Therapy

## 2024-09-07 ENCOUNTER — Ambulatory Visit (HOSPITAL_BASED_OUTPATIENT_CLINIC_OR_DEPARTMENT_OTHER): Admitting: Physical Therapy

## 2024-09-12 ENCOUNTER — Ambulatory Visit

## 2024-09-14 ENCOUNTER — Ambulatory Visit (HOSPITAL_COMMUNITY)

## 2024-09-19 ENCOUNTER — Telehealth (HOSPITAL_COMMUNITY): Admitting: Student in an Organized Health Care Education/Training Program

## 2024-09-19 ENCOUNTER — Ambulatory Visit: Admitting: Physical Therapy

## 2024-09-21 ENCOUNTER — Ambulatory Visit (HOSPITAL_BASED_OUTPATIENT_CLINIC_OR_DEPARTMENT_OTHER): Admitting: Physical Therapy

## 2024-10-10 ENCOUNTER — Ambulatory Visit: Admitting: Physical Therapy

## 2024-10-12 ENCOUNTER — Ambulatory Visit (HOSPITAL_BASED_OUTPATIENT_CLINIC_OR_DEPARTMENT_OTHER): Admitting: Physical Therapy

## 2024-10-17 ENCOUNTER — Ambulatory Visit: Admitting: Physical Therapy

## 2024-10-27 ENCOUNTER — Ambulatory Visit: Admitting: Internal Medicine

## 2024-10-27 ENCOUNTER — Ambulatory Visit: Admitting: Neurology
